# Patient Record
Sex: Male | Born: 1945
Health system: Southern US, Community
[De-identification: ages and names within clinical notes are randomized; demographics above are authoritative.]

## PROBLEM LIST (undated history)

## (undated) DIAGNOSIS — R251 Tremor, unspecified: Secondary | ICD-10-CM

## (undated) DIAGNOSIS — F0280 Dementia in other diseases classified elsewhere without behavioral disturbance: Secondary | ICD-10-CM

## (undated) DIAGNOSIS — E291 Testicular hypofunction: Secondary | ICD-10-CM

## (undated) DIAGNOSIS — R5383 Other fatigue: Secondary | ICD-10-CM

## (undated) DIAGNOSIS — I4891 Unspecified atrial fibrillation: Secondary | ICD-10-CM

## (undated) DIAGNOSIS — F0390 Unspecified dementia without behavioral disturbance: Secondary | ICD-10-CM

## (undated) DIAGNOSIS — F32A Depression, unspecified: Secondary | ICD-10-CM

## (undated) DIAGNOSIS — R2689 Other abnormalities of gait and mobility: Secondary | ICD-10-CM

## (undated) DIAGNOSIS — F0150 Vascular dementia without behavioral disturbance: Secondary | ICD-10-CM

## (undated) DIAGNOSIS — R42 Dizziness and giddiness: Secondary | ICD-10-CM

## (undated) DIAGNOSIS — G2581 Restless legs syndrome: Secondary | ICD-10-CM

## (undated) DIAGNOSIS — K644 Residual hemorrhoidal skin tags: Secondary | ICD-10-CM

## (undated) DIAGNOSIS — N529 Male erectile dysfunction, unspecified: Secondary | ICD-10-CM

## (undated) DIAGNOSIS — F419 Anxiety disorder, unspecified: Secondary | ICD-10-CM

## (undated) DIAGNOSIS — D1730 Benign lipomatous neoplasm of skin and subcutaneous tissue of unspecified sites: Secondary | ICD-10-CM

## (undated) DIAGNOSIS — G8929 Other chronic pain: Secondary | ICD-10-CM

## (undated) DIAGNOSIS — I10 Essential (primary) hypertension: Secondary | ICD-10-CM

## (undated) DIAGNOSIS — F329 Major depressive disorder, single episode, unspecified: Secondary | ICD-10-CM

## (undated) DIAGNOSIS — F319 Bipolar disorder, unspecified: Secondary | ICD-10-CM

## (undated) DIAGNOSIS — R413 Other amnesia: Secondary | ICD-10-CM

## (undated) DIAGNOSIS — I639 Cerebral infarction, unspecified: Secondary | ICD-10-CM

## (undated) DIAGNOSIS — M545 Low back pain, unspecified: Secondary | ICD-10-CM

## (undated) DIAGNOSIS — G4730 Sleep apnea, unspecified: Secondary | ICD-10-CM

## (undated) DIAGNOSIS — E785 Hyperlipidemia, unspecified: Secondary | ICD-10-CM

## (undated) DIAGNOSIS — I499 Cardiac arrhythmia, unspecified: Secondary | ICD-10-CM

## (undated) DIAGNOSIS — M1990 Unspecified osteoarthritis, unspecified site: Secondary | ICD-10-CM

## (undated) DIAGNOSIS — G309 Alzheimer's disease, unspecified: Secondary | ICD-10-CM

## (undated) HISTORY — DX: Unspecified atrial fibrillation: I48.91

## (undated) HISTORY — DX: Hyperlipidemia, unspecified: E78.5

## (undated) HISTORY — DX: Dementia in other diseases classified elsewhere, unspecified severity, without behavioral disturbance, psychotic disturbance, mood disturbance, and anxiety: F02.80

## (undated) HISTORY — DX: Other abnormalities of gait and mobility: R26.89

## (undated) HISTORY — DX: Alzheimer's disease, unspecified: G30.9

## (undated) HISTORY — DX: Benign lipomatous neoplasm of skin and subcutaneous tissue of unspecified sites: D17.30

## (undated) HISTORY — PX: TONSILLECTOMY AND ADENOIDECTOMY: SUR1326

## (undated) HISTORY — DX: Vascular dementia, unspecified severity, without behavioral disturbance, psychotic disturbance, mood disturbance, and anxiety: F02.80

## (undated) HISTORY — DX: Unspecified dementia, unspecified severity, without behavioral disturbance, psychotic disturbance, mood disturbance, and anxiety: F03.90

## (undated) HISTORY — DX: Tremor, unspecified: R25.1

## (undated) HISTORY — DX: Essential (primary) hypertension: I10

## (undated) HISTORY — DX: Male erectile dysfunction, unspecified: N52.9

## (undated) HISTORY — DX: Vascular dementia, unspecified severity, without behavioral disturbance, psychotic disturbance, mood disturbance, and anxiety: F01.50

## (undated) HISTORY — PX: COLONOSCOPY: SHX174

## (undated) HISTORY — DX: Cerebral infarction, unspecified: I63.9

---

## 1989-03-03 DIAGNOSIS — I639 Cerebral infarction, unspecified: Secondary | ICD-10-CM

## 1989-03-03 HISTORY — DX: Cerebral infarction, unspecified: I63.9

## 2007-09-24 ENCOUNTER — Emergency Department (HOSPITAL_COMMUNITY): Admission: EM | Admit: 2007-09-24 | Discharge: 2007-09-24 | Payer: Self-pay | Admitting: Emergency Medicine

## 2007-10-23 ENCOUNTER — Encounter: Admission: RE | Admit: 2007-10-23 | Discharge: 2007-10-23 | Payer: Self-pay | Admitting: Neurology

## 2010-11-29 LAB — DIFFERENTIAL
Eosinophils Absolute: 0.2
Eosinophils Relative: 3
Lymphs Abs: 1.9
Monocytes Relative: 7

## 2010-11-29 LAB — COMPREHENSIVE METABOLIC PANEL
ALT: <8
AST: 19
Albumin: 3.6
Calcium: 9.2
GFR calc Af Amer: >60
Sodium: 142
Total Protein: 6.1

## 2010-11-29 LAB — URINALYSIS, ROUTINE W REFLEX MICROSCOPIC
Ketones, ur: 15 — AB
Nitrite: NEGATIVE
Specific Gravity, Urine: 1.039 — ABNORMAL HIGH
Urobilinogen, UA: 1
pH: 6

## 2010-11-29 LAB — CBC
MCHC: 34.5
RBC: 3.87 — ABNORMAL LOW
RDW: 13.3

## 2010-11-29 LAB — PROTIME-INR: INR: 3 — ABNORMAL HIGH

## 2010-11-29 LAB — AMMONIA: Ammonia: 36 — ABNORMAL HIGH

## 2011-05-27 ENCOUNTER — Encounter: Payer: Self-pay | Admitting: *Deleted

## 2011-08-27 ENCOUNTER — Emergency Department (HOSPITAL_COMMUNITY): Payer: Medicare Other

## 2011-08-27 ENCOUNTER — Encounter (HOSPITAL_COMMUNITY): Payer: Self-pay | Admitting: *Deleted

## 2011-08-27 ENCOUNTER — Observation Stay (HOSPITAL_COMMUNITY)
Admission: EM | Admit: 2011-08-27 | Discharge: 2011-08-28 | Disposition: A | Discharge status: Home / Self Care | Payer: Medicare Other | Attending: Emergency Medicine | Admitting: Emergency Medicine

## 2011-08-27 DIAGNOSIS — Z8673 Personal history of transient ischemic attack (TIA), and cerebral infarction without residual deficits: Secondary | ICD-10-CM | POA: Insufficient documentation

## 2011-08-27 DIAGNOSIS — G459 Transient cerebral ischemic attack, unspecified: Principal | ICD-10-CM | POA: Insufficient documentation

## 2011-08-27 DIAGNOSIS — I10 Essential (primary) hypertension: Secondary | ICD-10-CM | POA: Insufficient documentation

## 2011-08-27 DIAGNOSIS — E785 Hyperlipidemia, unspecified: Secondary | ICD-10-CM | POA: Insufficient documentation

## 2011-08-27 LAB — BASIC METABOLIC PANEL
GFR calc Af Amer: 77 mL/min — ABNORMAL LOW (ref >90)
GFR calc non Af Amer: 66 mL/min — ABNORMAL LOW (ref >90)
Glucose, Bld: 113 mg/dL — ABNORMAL HIGH (ref 70–99)
Potassium: 4.9 mEq/L (ref 3.5–5.1)
Sodium: 139 mEq/L (ref 135–145)

## 2011-08-27 LAB — CBC
Hemoglobin: 14 g/dL (ref 13.0–17.0)
MCHC: 33.5 g/dL (ref 30.0–36.0)
Platelets: 161 10*3/uL (ref 150–400)

## 2011-08-27 LAB — URINALYSIS, ROUTINE W REFLEX MICROSCOPIC
Hgb urine dipstick: NEGATIVE
Leukocytes, UA: NEGATIVE
Nitrite: NEGATIVE
Protein, ur: NEGATIVE mg/dL
Specific Gravity, Urine: 1.022 (ref 1.005–1.030)
Urobilinogen, UA: 0.2 mg/dL (ref 0.0–1.0)

## 2011-08-27 LAB — PROTIME-INR
INR: 1.47 (ref 0.00–1.49)
Prothrombin Time: 18.1 seconds — ABNORMAL HIGH (ref 11.6–15.2)

## 2011-08-27 LAB — LIPID PANEL
HDL: 57 mg/dL (ref >39)
LDL Cholesterol: 64 mg/dL (ref 0–99)

## 2011-08-27 MED ORDER — CARBIDOPA-LEVODOPA 25-250 MG PO TABS
1.0000 | ORAL_TABLET | Freq: Two times a day (BID) | ORAL | Status: DC
  Administered 2011-08-28 (×2): 1 via ORAL
  Filled 2011-08-27 (×3): qty 1

## 2011-08-27 MED ORDER — DIVALPROEX SODIUM 250 MG PO DR TAB
250.0000 mg | DELAYED_RELEASE_TABLET | Freq: Two times a day (BID) | ORAL | Status: DC
  Administered 2011-08-28: 250 mg via ORAL
  Filled 2011-08-27: qty 1

## 2011-08-27 MED ORDER — LISINOPRIL 10 MG PO TABS
10.0000 mg | ORAL_TABLET | Freq: Every day | ORAL | Status: DC
  Administered 2011-08-28: 10 mg via ORAL
  Filled 2011-08-27 (×2): qty 1

## 2011-08-27 MED ORDER — WARFARIN SODIUM 5 MG PO TABS
5.0000 mg | ORAL_TABLET | Freq: Once | ORAL | Status: AC
  Administered 2011-08-28: 5 mg via ORAL
  Filled 2011-08-27: qty 1

## 2011-08-27 MED ORDER — LAMOTRIGINE 100 MG PO TABS
100.0000 mg | ORAL_TABLET | Freq: Every day | ORAL | Status: DC
  Administered 2011-08-28: 100 mg via ORAL
  Filled 2011-08-27 (×2): qty 1

## 2011-08-27 MED ORDER — SIMVASTATIN 10 MG PO TABS
10.0000 mg | ORAL_TABLET | Freq: Every evening | ORAL | Status: DC
  Administered 2011-08-28: 10 mg via ORAL
  Filled 2011-08-27 (×2): qty 1

## 2011-08-27 NOTE — ED Notes (Signed)
Pt has multiple complaints, reports going to pcp this am due to double vision this am along with unsteady gait. Remains mildly dizzy at this time, but reports it was decreased and double vision has resolved. Reports this has been occuring intermittently for at least the past month. Also having diarrhea occ diarrhea, had n/v today. No acute distress noted at triage.

## 2011-08-27 NOTE — ED Provider Notes (Addendum)
History     CSN: [id number]  Arrival date & time 08/27/11  1639   First MD Initiated Contact with Patient 08/27/11 2038      Chief Complaint  Patient presents with  . Dizziness  . Visual Field Change    (Consider location/radiation/quality/duration/timing/severity/associated sxs/prior treatment) Patient is a 66 y.o. male presenting with weakness. The history is provided by the patient and the spouse.  Weakness The primary symptoms include dizziness and visual change. Primary symptoms do not include headaches, paresthesias, focal weakness, loss of sensation or speech change. Primary symptoms comment: double vision in the left eye and ataxia that started around 11 am today and resolved prior to arrival in the ED The symptoms began 2 to 6 hours ago. The episode lasted 5 hours. The symptoms are resolved. The neurological symptoms are focal. Context: was driving in the car when double vision started.  Dizziness does not occur with weakness.  The visual change began today. The visual change has been resolved since its onset.The visual change is located in the entire field of vision of the left eye. The visual change includes double vision.  Additional symptoms include loss of balance. Additional symptoms do not include weakness, leg pain or taste disturbance. Medical issues also include cerebral vascular accident. Medical issues do not include seizures. Workup history includes MRI.    Past Medical History  Diagnosis Date  . HLD (hyperlipidemia)   . HTN (hypertension)   . Stroke 1991  . Atrial fibrillation     Past Surgical History  Procedure Date  . Tonsillectomy and adenoidectomy     Family History  Problem Relation Age of Onset  . Suicidality    . Cancer    . Alcohol abuse      History  Substance Use Topics  . Smoking status: Never Smoker   . Smokeless tobacco: Not on file  . Alcohol Use: Yes     occasional      Review of Systems  Eyes: Positive for double vision.    Neurological: Positive for dizziness and loss of balance. Negative for speech change, focal weakness, weakness, headaches and paresthesias.  All other systems reviewed and are negative.    Allergies  Review of patient's allergies indicates no known allergies.  Home Medications   Current Outpatient Rx  Name Route Sig Dispense Refill  . CARBIDOPA-LEVODOPA 25-250 MG PO TABS Oral Take 1 tablet by mouth 2 (two) times daily.    Marland Kitchen DIVALPROEX SODIUM 250 MG PO TBEC Oral Take 250 mg by mouth 2 (two) times daily.    . OMEGA-3 FATTY ACIDS 1000 MG PO CAPS Oral Take 1 g by mouth daily.     Marland Kitchen FLAX SEED OIL PO Oral Take 1 tablet by mouth daily.     Marland Kitchen FOLIC ACID 1 MG PO TABS Oral Take 1 mg by mouth daily.    Marland Kitchen LAMOTRIGINE 100 MG PO TABS Oral Take 100 mg by mouth daily.    Marland Kitchen LISINOPRIL 10 MG PO TABS Oral Take 10 mg by mouth daily.    Marland Kitchen SIMVASTATIN 20 MG PO TABS Oral Take 10 mg by mouth every evening.    . WARFARIN SODIUM 5 MG PO TABS Oral Take 5-7.5 mg by mouth daily. Take 7.5 mg on Tuesday, Thursday, and Saturday. All other days take 5 mg      BP 194/106  Pulse 74  Temp 97.6 F (36.4 C) (Oral)  Resp 15  SpO2 99%  Physical Exam  Nursing note and  vitals reviewed. Constitutional: He is oriented to person, place, and time. He appears well-developed and well-nourished. No distress.  HENT:  Head: Normocephalic and atraumatic.  Mouth/Throat: Oropharynx is clear and moist.  Eyes: Conjunctivae and EOM are normal. Pupils are equal, round, and reactive to light.  Neck: Normal range of motion. Neck supple. Carotid bruit is not present.  Cardiovascular: Normal rate and intact distal pulses.  An irregularly irregular rhythm present.  No murmur heard. Pulmonary/Chest: Effort normal and breath sounds normal. No respiratory distress. He has no wheezes. He has no rales.  Abdominal: Soft. He exhibits no distension. There is no tenderness. There is no rebound and no guarding.  Musculoskeletal: Normal range of  motion. He exhibits no edema and no tenderness.  Neurological: He is alert and oriented to person, place, and time. He has normal strength. No cranial nerve deficit or sensory deficit. He displays a negative Romberg sign. Coordination normal.       Normal finger to nose.  Normal heel to shin on the right and slow but accurate on the left  Skin: Skin is warm and dry. No rash noted. No erythema.  Psychiatric: He has a normal mood and affect. His behavior is normal.    ED Course  Procedures (including critical care time)  Labs Reviewed  BASIC METABOLIC PANEL - Abnormal; Notable for the following:    Glucose, Bld 113 (*)     GFR calc non Af Amer 66 (*)     GFR calc Af Amer 77 (*)     All other components within normal limits  PROTIME-INR - Abnormal; Notable for the following:    Prothrombin Time 18.1 (*)     All other components within normal limits  CARDIAC PANEL(CRET KIN+CKTOT+MB+TROPI) - Abnormal; Notable for the following:    CK, MB 4.6 (*)     Relative Index 3.2 (*)     All other components within normal limits  CBC  URINALYSIS, ROUTINE W REFLEX MICROSCOPIC   Mr Brain Wo Contrast  08/27/2011  *RADIOLOGY REPORT*  Clinical Data: 66 year old male with double vision, inability to walk, dizziness.  History of previous stroke affecting the left side.  MRI HEAD WITHOUT CONTRAST  Technique:  Multiplanar, multiecho pulse sequences of the brain and surrounding structures were obtained according to standard protocol without intravenous contrast.  Comparison: None.  Findings: No restricted diffusion to suggest acute infarction.  No midline shift, mass effect, evidence of mass lesion, ventriculomegaly, extra-axial collection or acute intracranial hemorrhage.  Cervicomedullary junction and pituitary are within normal limits.  Major intracranial vascular flow voids are preserved; dominant right vertebral artery suspected.  There is evidence of focal cystic encephalomalacia and wallerian degeneration at  the right mid brain.  No definite cortical encephalomalacia is identified in the right hemisphere.  There is scattered mild to moderate for age cerebral white matter T2 and FLAIR hyperintensity.  Deep gray matter nuclei are within normal limits for age.  Cerebellum is within normal limits.  Visualized internal auditory structures are grossly normal. Negative for age visualized cervical spine.  Normal bone marrow signal. Asymmetrically pneumatized right anterior clinoid process is suspected.  Visualized orbit soft tissues are within normal limits.  Small left maxillary sinus mucous retention cyst.  Negative scalp soft tissues.  IMPRESSION: 1. No acute intracranial abnormality. 2.  Evidence of remote right midbrain infarct.  Original Report Authenticated By: Harley Hallmark, M.D.    Date: 08/27/2011  Rate: 61  Rhythm: atrial fibrillation  QRS Axis: normal  Intervals: normal  ST/T Wave abnormalities: normal  Conduction Disutrbances:none  Narrative Interpretation:   Old EKG Reviewed: none available    1. TIA (transient ischemic attack)       MDM   Pt with sx concerning for cerebellar stroke with left sided double vision and ataxia today which has almost completely resolved.  Pt has hx of stroke 20 years ago which was from afib at that time.  Pt denies any focal weakness and on exam today has some difficulties with heel to shin on the left side.  Normal visual fields and no double vision now.  Denies any vertiginous sx and was able to ambulate to his room but was slow.  Coumadin is subtherapuetic.  EKG with afib.  MR of brain pending.  All labs wnl.    Pt's brain MR is wnl.  No carotid bruits on exam, however discussed with pt f/u with PCP to get outpt carotid and vertebral dopplers done.        Gwyneth Sprout, MD 08/27/11 1610  Gwyneth Sprout, MD 08/27/11 2333

## 2011-08-27 NOTE — ED Notes (Signed)
Report given to [place], Charity fundraiser and pt moved to CDU

## 2011-08-28 DIAGNOSIS — G459 Transient cerebral ischemic attack, unspecified: Secondary | ICD-10-CM

## 2011-08-28 DIAGNOSIS — I517 Cardiomegaly: Secondary | ICD-10-CM

## 2011-08-28 LAB — HEMOGLOBIN A1C: Mean Plasma Glucose: 128 mg/dL — ABNORMAL HIGH (ref <117)

## 2011-08-28 NOTE — Progress Notes (Signed)
VASCULAR LAB PRELIMINARY  PRELIMINARY  PRELIMINARY  PRELIMINARY  Carotid duplex  completed.    Preliminary report:  Bilateral:  No evidence of hemodynamically significant internal carotid artery stenosis.   Vertebral artery flow is antegrade.      Braylinn Gulden, RVT 08/28/2011, 11:04 AM

## 2011-08-28 NOTE — ED Notes (Signed)
Pt transported to vascular lab. 

## 2011-08-28 NOTE — ED Provider Notes (Signed)
Medical screening examination/treatment/procedure(s) were performed by non-physician practitioner and as supervising physician I was immediately available for consultation/collaboration.   Nat Christen, MD 08/28/11 [phone]

## 2011-08-28 NOTE — ED Notes (Signed)
Pt provided blood pressure and stroke booklet.

## 2011-08-28 NOTE — Progress Notes (Signed)
Observation review is complete. 

## 2011-08-28 NOTE — Discharge Instructions (Signed)
Follow up with your Primary Care Physician.  Your coumadin dose may need to be adjusted.  Your INR was low today.  The MRI done of your brain did not show any evidence of a new stoke.   Transient Ischemic Attack A transient ischemic attack (TIA) is a "warning stroke" that causes stroke-like symptoms. Unlike a stroke, a TIA does not cause permanent damage to the brain. The symptoms of a TIA can happen very fast and do not last long. It is important to know the symptoms of a TIA and what to do. This can help prevent a major stroke or death. CAUSES   A TIA is caused by a temporary blockage in an artery in the brain or neck (carotid artery). The blockage does not allow the brain to get the blood supply it needs and can cause different symptoms. The blockage can be caused by either:   A blood clot.   Fatty buildup (plaque) in a neck or brain artery.  SYMPTOMS  TIA symptoms are the same as a stroke but are temporary. Symptoms can include sudden:  Numbness or weakness on one side of the body. Especially to the:   Face.   Arm.   Leg.   Trouble speaking, thinking, or confusion.   Change in vision, such as trouble seeing in one or both eyes.   Dizziness, loss of balance, or difficulty walking.   Severe headache.  ANY OF THESE SYMPTOMS MAY REPRESENT A SERIOUS PROBLEM THAT IS AN EMERGENCY. Do not wait to see if the symptoms will go away. Get medical help at once. Call your local emergency services (911 in [place] IMMEDIATELY. DO NOT drive yourself to the hospital. RISK FACTORS Risk factors can increase the risk of developing a TIA. These can include.   High blood pressure (hypertension).   High cholesterol (hyperlipidemia).   Heart disease (atherosclerosis).   Smoking.   Diabetes.   Abnormal heart rhythm (atrial fibrillation).   Family history of a stroke or heart attack.   Use of oral contraceptives (especially when combined with smoking).  DIAGNOSIS   A TIA can be diagnosed  based on your:   Symptoms.   History.   Risk factors.   Tests that can help diagnose the symptoms of a TIA include:   CT or MRI scan. These tests can provide detailed images of the brain.   Carotid ultrasound. This test looks to see if there are blockages in the carotid arteries of your neck.   Arteriography. A thin, small flexible tube (catheter) is inserted through a small cut (incision) in your groin. The catheter is threaded to your carotid or vertebral artery. A dye is then injected into the catheter. The dye highlights the arteries in your brain and allows your caregiver to look for narrowing or blockages that can cause a TIA.  TREATMENT  Based on the cause of a TIA, treatment options can vary. Treatment is important to help prevent a stroke. Treatment options can include:  Medication. Such as:   Clot-busting medicine.   Anti-platelet medicine.   Blood pressure medicine.   Blood thinner medicine.   Surgery:   Carotid endarterectomy. The carotid arteries are the arteries that supply the head and neck with oxygenated blood. This surgery can help remove fatty deposits (plaque) in the carotid arteries.   Angioplasty and stenting. This surgery uses a balloon to dilate a blocked artery in the brain. A stent is a small, metal mesh tube that can help keep an artery open  HOME CARE INSTRUCTIONS   It is important to take all medicine as told by your caregiver. If the medicine has side effects that affect you negatively, tell your caregiver right away. Do not stop taking medicine unless told by your caregiver. Some medicines may need to be changed to better treat your condition.   Do not smoke. Talk to your caregiver on how to quit smoking.   Eat a diet high in fruits, vegetables and lean meat. Avoid a high fat, high salt diet. A dietician can you help you make healthy food choices.   Maintain a healthy weight. Develop an exercise plan approved by your caregiver.  SEEK IMMEDIATE  MEDICAL CARE IF:   You develop weakness or numbness on one side of your body.   You have problems thinking, speaking, or feel confused.   You have vision changes.   You feel dizzy, have trouble walking, or lose your balance.   You develop a severe headache.  MAKE SURE YOU:   Understand these instructions.   Will watch your condition.   Will get help right away if you are not doing well or get worse.  Document Released: 11/27/2004 Document Revised: 02/06/2011 Document Reviewed: 04/12/2009 [facility] Patient Information 2012 [place], [place].Transient Ischemic Attack A transient ischemic attack (TIA) is a "warning stroke" that causes stroke-like symptoms. Unlike a stroke, a TIA does not cause permanent damage to the brain. The symptoms of a TIA can happen very fast and do not last long. It is important to know the symptoms of a TIA and what to do. This can help prevent a major stroke or death. CAUSES   A TIA is caused by a temporary blockage in an artery in the brain or neck (carotid artery). The blockage does not allow the brain to get the blood supply it needs and can cause different symptoms. The blockage can be caused by either:   A blood clot.   Fatty buildup (plaque) in a neck or brain artery.  SYMPTOMS  TIA symptoms are the same as a stroke but are temporary. Symptoms can include sudden:  Numbness or weakness on one side of the body. Especially to the:   Face.   Arm.   Leg.   Trouble speaking, thinking, or confusion.   Change in vision, such as trouble seeing in one or both eyes.   Dizziness, loss of balance, or difficulty walking.   Severe headache.  ANY OF THESE SYMPTOMS MAY REPRESENT A SERIOUS PROBLEM THAT IS AN EMERGENCY. Do not wait to see if the symptoms will go away. Get medical help at once. Call your local emergency services (911 in [place] IMMEDIATELY. DO NOT drive yourself to the hospital. RISK FACTORS Risk factors can increase the risk of developing a TIA.  These can include.   High blood pressure (hypertension).   High cholesterol (hyperlipidemia).   Heart disease (atherosclerosis).   Smoking.   Diabetes.   Abnormal heart rhythm (atrial fibrillation).   Family history of a stroke or heart attack.   Use of oral contraceptives (especially when combined with smoking).  DIAGNOSIS   A TIA can be diagnosed based on your:   Symptoms.   History.   Risk factors.   Tests that can help diagnose the symptoms of a TIA include:   CT or MRI scan. These tests can provide detailed images of the brain.   Carotid ultrasound. This test looks to see if there are blockages in the carotid arteries of your neck.   Arteriography.  A thin, small flexible tube (catheter) is inserted through a small cut (incision) in your groin. The catheter is threaded to your carotid or vertebral artery. A dye is then injected into the catheter. The dye highlights the arteries in your brain and allows your caregiver to look for narrowing or blockages that can cause a TIA.  TREATMENT  Based on the cause of a TIA, treatment options can vary. Treatment is important to help prevent a stroke. Treatment options can include:  Medication. Such as:   Clot-busting medicine.   Anti-platelet medicine.   Blood pressure medicine.   Blood thinner medicine.   Surgery:   Carotid endarterectomy. The carotid arteries are the arteries that supply the head and neck with oxygenated blood. This surgery can help remove fatty deposits (plaque) in the carotid arteries.   Angioplasty and stenting. This surgery uses a balloon to dilate a blocked artery in the brain. A stent is a small, metal mesh tube that can help keep an artery open  HOME CARE INSTRUCTIONS   It is important to take all medicine as told by your caregiver. If the medicine has side effects that affect you negatively, tell your caregiver right away. Do not stop taking medicine unless told by your caregiver. Some  medicines may need to be changed to better treat your condition.   Do not smoke. Talk to your caregiver on how to quit smoking.   Eat a diet high in fruits, vegetables and lean meat. Avoid a high fat, high salt diet. A dietician can you help you make healthy food choices.   Maintain a healthy weight. Develop an exercise plan approved by your caregiver.  SEEK IMMEDIATE MEDICAL CARE IF:   You develop weakness or numbness on one side of your body.   You have problems thinking, speaking, or feel confused.   You have vision changes.   You feel dizzy, have trouble walking, or lose your balance.   You develop a severe headache.  MAKE SURE YOU:   Understand these instructions.   Will watch your condition.   Will get help right away if you are not doing well or get worse.  Document Released: 11/27/2004 Document Revised: 02/06/2011 Document Reviewed: 04/12/2009 [facility] Patient Information 2012 [place], [place].

## 2011-08-28 NOTE — ED Provider Notes (Signed)
7:00 AM Assumed care of patient in the CDU.  Patient is currently on TIA protocol.  Patient presented to the ED yesterday with a chief complaint of dizziness and double vision.  Patient had a MRI yesterday, which did not show any acute abnormalities.  He is to get Carotid Dopplers and 2D Echocardiogram today.  Plan is for him to be discharged home if normal.    7:30 AM Reassessed patient.  Patient reports that he is not having any symptoms at this time.  Alert and orientated x 3, Heart RRR, Lungs CTAB, CN II-XII grossly intact, grip strength 5/5 bilaterally, lower extremity muscle strength 5/5 bilaterally, normal gait.    10:30 AM Reassessed patient.  He reports that he is not having any symptoms at this time.  2D Echocardiogram showed an EF of 55% and atrial fibrillation.  Patient has a history of a fib and is currently on Coumadin.  Carotid Doppler has been completed.  Awaiting results.  11:30 AM Carotid Doppler did not show any significant stenosis.  Patient continues to be asymptomatic.  No visual changes at this time.  Alert and orientated x 3, Heart RRR, Lungs CTAB, CN II-XII grossly intact, grip strength 5/5 bilaterally, lower extremity muscle strength 5/5 bilaterally, normal gait.  Discussed all of the results with the patient.  Plan is for patient to be discharged home.  Patient instructed to follow up with his Primary Care Physician.  Patient in agreement with the plan.  Return precautions have been discussed.    Pascal Lux [place], PA-C 08/28/11 1226

## 2011-08-28 NOTE — Progress Notes (Signed)
  Echocardiogram 2D Echocardiogram has been performed.  Virjean Boman 08/28/2011, 9:03 AM

## 2011-11-24 ENCOUNTER — Other Ambulatory Visit: Payer: Self-pay | Admitting: Neurology

## 2011-11-24 DIAGNOSIS — I679 Cerebrovascular disease, unspecified: Secondary | ICD-10-CM

## 2011-11-24 DIAGNOSIS — R413 Other amnesia: Secondary | ICD-10-CM

## 2011-11-24 DIAGNOSIS — H532 Diplopia: Secondary | ICD-10-CM

## 2012-07-01 ENCOUNTER — Other Ambulatory Visit (HOSPITAL_COMMUNITY): Payer: Self-pay | Admitting: Cardiovascular Disease

## 2012-07-01 DIAGNOSIS — I4891 Unspecified atrial fibrillation: Secondary | ICD-10-CM

## 2012-07-09 ENCOUNTER — Telehealth: Payer: Self-pay | Admitting: Diagnostic Neuroimaging

## 2012-07-09 ENCOUNTER — Other Ambulatory Visit: Payer: Self-pay | Admitting: Internal Medicine

## 2012-07-09 ENCOUNTER — Ambulatory Visit
Admission: RE | Admit: 2012-07-09 | Discharge: 2012-07-09 | Disposition: A | Discharge status: Home / Self Care | Payer: Medicare Other | Source: Ambulatory Visit | Attending: Internal Medicine | Admitting: Internal Medicine

## 2012-07-09 DIAGNOSIS — H532 Diplopia: Secondary | ICD-10-CM

## 2012-07-14 ENCOUNTER — Encounter (HOSPITAL_COMMUNITY): Payer: Medicare Other

## 2012-07-22 ENCOUNTER — Institutional Professional Consult (permissible substitution): Payer: Medicare Other | Admitting: Neurology

## 2012-08-19 ENCOUNTER — Institutional Professional Consult (permissible substitution): Payer: Medicare Other | Admitting: Neurology

## 2012-08-20 ENCOUNTER — Encounter (HOSPITAL_COMMUNITY): Payer: Medicare Other

## 2012-08-26 ENCOUNTER — Telehealth: Payer: Self-pay | Admitting: Neurology

## 2012-08-30 ENCOUNTER — Ambulatory Visit: Payer: Medicare Other | Admitting: Neurology

## 2012-08-31 ENCOUNTER — Ambulatory Visit (HOSPITAL_COMMUNITY)
Admission: RE | Admit: 2012-08-31 | Discharge: 2012-08-31 | Disposition: A | Discharge status: Home / Self Care | Payer: Medicare Other | Source: Ambulatory Visit | Attending: Cardiology | Admitting: Cardiology

## 2012-08-31 ENCOUNTER — Institutional Professional Consult (permissible substitution): Payer: Medicare Other | Admitting: Neurology

## 2012-08-31 DIAGNOSIS — I4891 Unspecified atrial fibrillation: Secondary | ICD-10-CM | POA: Insufficient documentation

## 2012-08-31 DIAGNOSIS — R42 Dizziness and giddiness: Secondary | ICD-10-CM | POA: Insufficient documentation

## 2012-08-31 DIAGNOSIS — I10 Essential (primary) hypertension: Secondary | ICD-10-CM | POA: Insufficient documentation

## 2012-08-31 DIAGNOSIS — R5381 Other malaise: Secondary | ICD-10-CM | POA: Insufficient documentation

## 2012-08-31 DIAGNOSIS — Z8673 Personal history of transient ischemic attack (TIA), and cerebral infarction without residual deficits: Secondary | ICD-10-CM | POA: Insufficient documentation

## 2012-08-31 MED ORDER — TECHNETIUM TC 99M SESTAMIBI GENERIC - CARDIOLITE
10.6000 | Freq: Once | INTRAVENOUS | Status: AC | PRN
  Administered 2012-08-31: 11 via INTRAVENOUS

## 2012-08-31 MED ORDER — TECHNETIUM TC 99M SESTAMIBI GENERIC - CARDIOLITE
30.7000 | Freq: Once | INTRAVENOUS | Status: AC | PRN
  Administered 2012-08-31: 30.7 via INTRAVENOUS

## 2012-08-31 NOTE — Procedures (Addendum)
[facility] [address] [place] [address] [phone]  Cardiology Nuclear Med Study  [name] [name] is a 67 y.o. male     MRN : [id number]     DOB: 1945/07/30  Procedure Date: 08/31/2012  Nuclear Med Background Indication for Stress Test:  Evaluation for Ischemia History:  A-FIB Cardiac Risk Factors: CVA, Hypertension and Lipids  Symptoms:  Dizziness, Fatigue and Light-Headedness   Nuclear Pre-Procedure Caffeine/Decaff Intake:  1:00am NPO After: 11AM   IV Site: R Antecubital  IV 0.9% NS with Angio Cath:  22g  Chest Size (in):  42"  IV Started by: Emmit Pomfret, RN  Height: 5\' 10"  (1.778 m)  Cup Size: n/a  BMI:  Body mass index is 28.12 kg/(m^2). Weight:  196 lb (88.905 kg)   Tech Comments:  N/A    Nuclear Med Study 1 or 2 day study: 1 day  Stress Test Type:  Stress  Order Authorizing Provider:  [name] BERRY,MD   Resting Radionuclide: Technetium 36m Sestamibi  Resting Radionuclide Dose: 10.6 mCi   Stress Radionuclide:  Technetium 45m Sestamibi  Stress Radionuclide Dose: 30.7 mCi           Stress Protocol Rest HR: 83 Stress HR: 173  Rest BP:126/83 Stress BP: 147/68  Exercise Time (min): 4:15 METS: 6.10   Predicted Max HR: 151 bpm % Max HR: 114.57 bpm Rate Pressure Product: 32440  Dose of Adenosine (mg):  n/a Dose of Lexiscan: n/a mg  Dose of Atropine (mg): n/a Dose of Dobutamine: n/a mcg/kg/min (at max HR)  Stress Test Technologist: Ernestene Mention, CCT Nuclear Technologist: Gonzella [name], CNMT   Rest Procedure:  Myocardial perfusion imaging was performed at rest 45 minutes following the intravenous administration of Technetium 33m Sestamibi. Stress Procedure:  The patient performed treadmill exercise using a Bruce  Protocol for 4 minutes and 15 seconds. The patient stopped due to fatigue and shortness of breath. Patient denied any chest pain.  There were no significant ST-T wave changes.  Technetium 51m  Sestamibi was injected at peak exercise and myocardial perfusion imaging was performed after a brief delay.  Transient Ischemic Dilatation (Normal <1.22):  1.05 Lung/Heart Ratio (Normal <0.45):  0.31 QGS EDV:  n/a ml QGS ESV:  n/a ml LV Ejection Fraction: Study not gated  Signed by Gonzella [name], CNMT  PHYSICIAN INTERPRETATION  Rest ECG: Atrial Fibrilliation and non-specific ST-T changes  Stress ECG: Insignificant upsloping ST segment depression.  QPS Raw Data Images:  Mild diaphragmatic attenuation.  Normal left ventricular size. Stress Images:  There is decreased uptake in the inferior wall.  There is decreased uptake in the apex.  There does appear to be a small sized, mild intensity reversible perfusion defect involving the anteroapical wall.   Rest Images:  There is decreased uptake in the inferior wall.   Subtraction (SDS):  There is a fixed inferior defect that is most consistent with diaphragmatic attenuation.  There are findings suggesting possible mild apical ischemia.  Impression Exercise Capacity:  Fair exercise capacity. BP Response:  Normal BP response, but brisk increas of Afib-VR up to 170 bpm.  Clinical Symptoms:  There is dyspnea. and Fatigue ECG Impression:  Insignificant upsloping ST segment depression. Comparison with Prior Nuclear Study: No images to compare  Overall Impression:  Low risk stress nuclear study with a suggestion of a small area of anteroapical ischemia..  LV Wall Motion:  Not evaluated due to Afib.   [name] Lex, MD  08/31/2012  4:57 PM

## 2013-03-17 ENCOUNTER — Observation Stay (HOSPITAL_COMMUNITY)
Admission: EM | Admit: 2013-03-17 | Discharge: 2013-03-21 | Disposition: A | Discharge status: Skilled Nursing Facility | Payer: Medicare Other | Attending: Internal Medicine | Admitting: Internal Medicine

## 2013-03-17 ENCOUNTER — Emergency Department (HOSPITAL_COMMUNITY): Payer: Medicare Other

## 2013-03-17 ENCOUNTER — Encounter (HOSPITAL_COMMUNITY): Payer: Self-pay | Admitting: Emergency Medicine

## 2013-03-17 ENCOUNTER — Inpatient Hospital Stay (HOSPITAL_COMMUNITY): Payer: Medicare Other

## 2013-03-17 DIAGNOSIS — R4789 Other speech disturbances: Secondary | ICD-10-CM | POA: Insufficient documentation

## 2013-03-17 DIAGNOSIS — I10 Essential (primary) hypertension: Secondary | ICD-10-CM | POA: Diagnosis present

## 2013-03-17 DIAGNOSIS — R471 Dysarthria and anarthria: Secondary | ICD-10-CM | POA: Insufficient documentation

## 2013-03-17 DIAGNOSIS — I4891 Unspecified atrial fibrillation: Secondary | ICD-10-CM

## 2013-03-17 DIAGNOSIS — G4733 Obstructive sleep apnea (adult) (pediatric): Secondary | ICD-10-CM | POA: Insufficient documentation

## 2013-03-17 DIAGNOSIS — G459 Transient cerebral ischemic attack, unspecified: Secondary | ICD-10-CM | POA: Diagnosis present

## 2013-03-17 DIAGNOSIS — R262 Difficulty in walking, not elsewhere classified: Secondary | ICD-10-CM | POA: Insufficient documentation

## 2013-03-17 DIAGNOSIS — I6789 Other cerebrovascular disease: Secondary | ICD-10-CM | POA: Insufficient documentation

## 2013-03-17 DIAGNOSIS — H538 Other visual disturbances: Secondary | ICD-10-CM | POA: Insufficient documentation

## 2013-03-17 DIAGNOSIS — J3489 Other specified disorders of nose and nasal sinuses: Secondary | ICD-10-CM | POA: Insufficient documentation

## 2013-03-17 DIAGNOSIS — M1610 Unilateral primary osteoarthritis, unspecified hip: Secondary | ICD-10-CM | POA: Insufficient documentation

## 2013-03-17 DIAGNOSIS — F319 Bipolar disorder, unspecified: Secondary | ICD-10-CM

## 2013-03-17 DIAGNOSIS — E785 Hyperlipidemia, unspecified: Secondary | ICD-10-CM | POA: Diagnosis present

## 2013-03-17 DIAGNOSIS — H532 Diplopia: Secondary | ICD-10-CM | POA: Insufficient documentation

## 2013-03-17 DIAGNOSIS — M169 Osteoarthritis of hip, unspecified: Secondary | ICD-10-CM | POA: Insufficient documentation

## 2013-03-17 DIAGNOSIS — Z8673 Personal history of transient ischemic attack (TIA), and cerebral infarction without residual deficits: Secondary | ICD-10-CM | POA: Insufficient documentation

## 2013-03-17 DIAGNOSIS — R269 Unspecified abnormalities of gait and mobility: Principal | ICD-10-CM | POA: Insufficient documentation

## 2013-03-17 DIAGNOSIS — Z9181 History of falling: Secondary | ICD-10-CM | POA: Insufficient documentation

## 2013-03-17 DIAGNOSIS — I6529 Occlusion and stenosis of unspecified carotid artery: Secondary | ICD-10-CM | POA: Insufficient documentation

## 2013-03-17 DIAGNOSIS — M25559 Pain in unspecified hip: Secondary | ICD-10-CM | POA: Insufficient documentation

## 2013-03-17 DIAGNOSIS — Z7901 Long term (current) use of anticoagulants: Secondary | ICD-10-CM | POA: Insufficient documentation

## 2013-03-17 DIAGNOSIS — R2681 Unsteadiness on feet: Secondary | ICD-10-CM | POA: Diagnosis present

## 2013-03-17 DIAGNOSIS — I482 Chronic atrial fibrillation, unspecified: Secondary | ICD-10-CM | POA: Diagnosis present

## 2013-03-17 LAB — COMPREHENSIVE METABOLIC PANEL
ALK PHOS: 103 U/L (ref 39–117)
ALT: <5 U/L (ref 0–53)
AST: 21 U/L (ref 0–37)
Albumin: 4.6 g/dL (ref 3.5–5.2)
BILIRUBIN TOTAL: 0.6 mg/dL (ref 0.3–1.2)
BUN: 8 mg/dL (ref 6–23)
CO2: 25 meq/L (ref 19–32)
CREATININE: 0.76 mg/dL (ref 0.50–1.35)
Calcium: 10.4 mg/dL (ref 8.4–10.5)
Chloride: 95 mEq/L — ABNORMAL LOW (ref 96–112)
GFR calc Af Amer: >90 mL/min (ref >90)
Glucose, Bld: 105 mg/dL — ABNORMAL HIGH (ref 70–99)
POTASSIUM: 4.2 meq/L (ref 3.7–5.3)
Sodium: 136 mEq/L — ABNORMAL LOW (ref 137–147)
Total Protein: 8 g/dL (ref 6.0–8.3)

## 2013-03-17 LAB — CBC
HCT: 44.4 % (ref 39.0–52.0)
HEMOGLOBIN: 15.3 g/dL (ref 13.0–17.0)
MCH: 32.3 pg (ref 26.0–34.0)
MCHC: 34.5 g/dL (ref 30.0–36.0)
MCV: 93.7 fL (ref 78.0–100.0)
Platelets: 193 10*3/uL (ref 150–400)
RBC: 4.74 MIL/uL (ref 4.22–5.81)
RDW: 12.4 % (ref 11.5–15.5)
WBC: 4.6 10*3/uL (ref 4.0–10.5)

## 2013-03-17 LAB — POCT I-STAT TROPONIN I: Troponin i, poc: 0 ng/mL (ref 0.00–0.08)

## 2013-03-17 LAB — POCT I-STAT, CHEM 8
BUN: 7 mg/dL (ref 6–23)
Calcium, Ion: 1.31 mmol/L — ABNORMAL HIGH (ref 1.13–1.30)
Chloride: 96 mEq/L (ref 96–112)
Creatinine, Ser: 0.9 mg/dL (ref 0.50–1.35)
Glucose, Bld: 110 mg/dL — ABNORMAL HIGH (ref 70–99)
HCT: 49 % (ref 39.0–52.0)
HEMOGLOBIN: 16.7 g/dL (ref 13.0–17.0)
Potassium: 4 mEq/L (ref 3.7–5.3)
SODIUM: 136 meq/L — AB (ref 137–147)
TCO2: 28 mmol/L (ref 0–100)

## 2013-03-17 LAB — DIFFERENTIAL
Basophils Absolute: 0 10*3/uL (ref 0.0–0.1)
Basophils Relative: 0 % (ref 0–1)
Eosinophils Absolute: 0.2 10*3/uL (ref 0.0–0.7)
Eosinophils Relative: 3 % (ref 0–5)
LYMPHS PCT: 33 % (ref 12–46)
Lymphs Abs: 1.5 10*3/uL (ref 0.7–4.0)
MONO ABS: 0.3 10*3/uL (ref 0.1–1.0)
MONOS PCT: 7 % (ref 3–12)
Neutro Abs: 2.6 10*3/uL (ref 1.7–7.7)
Neutrophils Relative %: 56 % (ref 43–77)

## 2013-03-17 LAB — PROTIME-INR
INR: 2.4 — ABNORMAL HIGH (ref 0.00–1.49)
Prothrombin Time: 25.4 seconds — ABNORMAL HIGH (ref 11.6–15.2)

## 2013-03-17 LAB — URINALYSIS, ROUTINE W REFLEX MICROSCOPIC
Bilirubin Urine: NEGATIVE
Glucose, UA: NEGATIVE mg/dL
HGB URINE DIPSTICK: NEGATIVE
Ketones, ur: 15 mg/dL — AB
Leukocytes, UA: NEGATIVE
NITRITE: NEGATIVE
PROTEIN: NEGATIVE mg/dL
Specific Gravity, Urine: 1.012 (ref 1.005–1.030)
UROBILINOGEN UA: 0.2 mg/dL (ref 0.0–1.0)
pH: 8 (ref 5.0–8.0)

## 2013-03-17 LAB — RAPID URINE DRUG SCREEN, HOSP PERFORMED
Amphetamines: NOT DETECTED
BARBITURATES: NOT DETECTED
Benzodiazepines: NOT DETECTED
Cocaine: NOT DETECTED
OPIATES: NOT DETECTED
TETRAHYDROCANNABINOL: NOT DETECTED

## 2013-03-17 LAB — TROPONIN I: Troponin I: <0.3 ng/mL (ref <0.30)

## 2013-03-17 LAB — ETHANOL: Alcohol, Ethyl (B): <11 mg/dL (ref 0–11)

## 2013-03-17 LAB — APTT: APTT: 38 s — AB (ref 24–37)

## 2013-03-17 MED ORDER — WARFARIN SODIUM 7.5 MG PO TABS
7.5000 mg | ORAL_TABLET | ORAL | Status: AC
  Administered 2013-03-18: 7.5 mg via ORAL
  Filled 2013-03-17 (×2): qty 1

## 2013-03-17 MED ORDER — LAMOTRIGINE 150 MG PO TABS
150.0000 mg | ORAL_TABLET | Freq: Two times a day (BID) | ORAL | Status: DC
  Administered 2013-03-18 – 2013-03-21 (×8): 150 mg via ORAL
  Filled 2013-03-17 (×10): qty 1

## 2013-03-17 MED ORDER — ONDANSETRON HCL 4 MG/2ML IJ SOLN: 4.0000 mg | Freq: Four times a day (QID) | INTRAMUSCULAR | Status: DC | PRN

## 2013-03-17 MED ORDER — ACETAMINOPHEN 650 MG RE SUPP
650.0000 mg | Freq: Four times a day (QID) | RECTAL | Status: DC | PRN
Start: 2013-03-17 — End: 2013-03-21

## 2013-03-17 MED ORDER — WARFARIN SODIUM 7.5 MG PO TABS: 7.5000 mg | ORAL_TABLET | ORAL | Status: DC

## 2013-03-17 MED ORDER — OMEGA-3 FATTY ACIDS 1000 MG PO CAPS: 1.0000 g | ORAL_CAPSULE | Freq: Every day | ORAL | Status: DC

## 2013-03-17 MED ORDER — ATORVASTATIN CALCIUM 40 MG PO TABS
40.0000 mg | ORAL_TABLET | Freq: Every day | ORAL | Status: DC
  Administered 2013-03-18 – 2013-03-21 (×4): 40 mg via ORAL
  Filled 2013-03-17 (×5): qty 1

## 2013-03-17 MED ORDER — SODIUM CHLORIDE 0.9 % IJ SOLN
3.0000 mL | Freq: Two times a day (BID) | INTRAMUSCULAR | Status: DC
  Administered 2013-03-18 – 2013-03-20 (×6): 3 mL via INTRAVENOUS

## 2013-03-17 MED ORDER — WARFARIN SODIUM 5 MG PO TABS
5.0000 mg | ORAL_TABLET | ORAL | Status: DC
  Filled 2013-03-17: qty 1

## 2013-03-17 MED ORDER — DIVALPROEX SODIUM 250 MG PO DR TAB
250.0000 mg | DELAYED_RELEASE_TABLET | Freq: Two times a day (BID) | ORAL | Status: DC
  Administered 2013-03-18 – 2013-03-21 (×8): 250 mg via ORAL
  Filled 2013-03-17 (×10): qty 1

## 2013-03-17 MED ORDER — WARFARIN - PHARMACIST DOSING INPATIENT: Freq: Every day | Status: DC

## 2013-03-17 MED ORDER — LISINOPRIL 10 MG PO TABS
10.0000 mg | ORAL_TABLET | Freq: Every day | ORAL | Status: DC
  Administered 2013-03-18 – 2013-03-21 (×4): 10 mg via ORAL
  Filled 2013-03-17 (×5): qty 1

## 2013-03-17 MED ORDER — ACETAMINOPHEN 325 MG PO TABS: 650.0000 mg | ORAL_TABLET | Freq: Four times a day (QID) | ORAL | Status: DC | PRN

## 2013-03-17 MED ORDER — ONDANSETRON HCL 4 MG PO TABS: 4.0000 mg | ORAL_TABLET | Freq: Four times a day (QID) | ORAL | Status: DC | PRN

## 2013-03-17 MED ORDER — OMEGA-3-ACID ETHYL ESTERS 1 G PO CAPS
1.0000 g | ORAL_CAPSULE | Freq: Every day | ORAL | Status: DC
  Administered 2013-03-18 – 2013-03-21 (×4): 1 g via ORAL
  Filled 2013-03-17 (×4): qty 1

## 2013-03-17 MED ORDER — CARBIDOPA-LEVODOPA 25-250 MG PO TABS
1.0000 | ORAL_TABLET | Freq: Two times a day (BID) | ORAL | Status: DC
  Administered 2013-03-18 – 2013-03-21 (×8): 1 via ORAL
  Filled 2013-03-17 (×10): qty 1

## 2013-03-17 NOTE — H&P (Addendum)
[facility] History and Physical  [name] Williamsen PPI:[id number] DOB: [birth year] DOA: 03/17/2013   PCP: Horatio Pel, MD   Chief Complaint: Slurred speech, difficulty walking  HPI:  68 year old male PMH of stroke, atrial fibrillation, HTN, and HLD who presents to the ED with slurred speech and visual disturbance. The patient went to bed around 3 AM on the day of admission. He woke up between 12 PM and 1 PM and noted some difficulty walking. He also had some blurry vision with diplopia. Patient states that he has had problems with blurred vision diplopia intermittently since January 2013. He states that this occurs anywhere from once per week to a few times per month. He has had multiple workups for stroke and was most recently admitted to [facility] in Summer 2014 during which stroke workup was neg.  He sees Dr. Jannifer Franklin for neurologic care. The patient felt like he had loss of balance this afternoon when he woke up. In addition, his wife noted some slurred speech which seems to be improved currently. However, he denies any overt dizziness, headache, syncope. In addition, the patient had this loss of balance 2 weeks ago and fell onto his left buttocks. The patient endorses compliance with his medications, but has forgotten to refill his lisinopril for over one month.  the patient denies any fevers, chills, chest discomfort, shortness breath, nausea, vomiting, diarrhea, abdominal pain, rashes.  In the ED,  BMP and CBC were unremarkable. Hepatic panel was unremarkable. INR was 2.40. CT brain was negative. Left hip x-ray was negative for fracture or dislocation. Incidentally, the patient had a nuclear medicine stress test on 08/31/2012 which was interpreted as low risk. MRI of the brain is pending.  Assessment/Plan: Dysarthria and gait disturbance -concerned about TIA/stroke -CT brain is negative -MRI brain--ordered in ED -Hemoglobin A1c, lipid panel, TSH -Echocardiogram -Carotid  duplex -Neurology has been consulted  -PT/OT -UA with reflex to urine culture -Depakote level Atrial fibrillation -Rate controlled -Continue warfarin  bipolar disorder  -Continue Depakote and Lamictal  Hypertension  -Continue lisinopril  Hyperlipidemia  -Continue Lipitor        Past Medical History  Diagnosis Date  . HLD (hyperlipidemia)   . HTN (hypertension)   . Stroke 1991  . Atrial fibrillation    Past Surgical History  Procedure Laterality Date  . Tonsillectomy and adenoidectomy     Social History:  reports that he has never smoked. He does not have any smokeless tobacco history on file. He reports that he drinks alcohol. His drug history is not on file.   Family History  Problem Relation Age of Onset  . Suicidality    . Cancer    . Alcohol abuse       No Known Allergies    Prior to Admission medications   Medication Sig Start Date End Date Taking? Authorizing Provider  atorvastatin (LIPITOR) 40 MG tablet Take 40 mg by mouth daily.   Yes Historical Provider, MD  carbidopa-levodopa (SINEMET IR) 25-250 MG per tablet Take 1 tablet by mouth 2 (two) times daily.   Yes Historical Provider, MD  divalproex (DEPAKOTE) 250 MG DR tablet Take 250 mg by mouth 2 (two) times daily.   Yes Historical Provider, MD  fish oil-omega-3 fatty acids 1000 MG capsule Take 1 g by mouth daily.    Yes Historical Provider, MD  folic acid (FOLVITE) 1 MG tablet Take 1 mg by mouth daily.   Yes Historical Provider, MD  lamoTRIgine (LAMICTAL) 150 MG tablet Take 150 mg  by mouth 2 (two) times daily.   Yes Historical Provider, MD  warfarin (COUMADIN) 5 MG tablet Take 5-7.5 mg by mouth daily. Take 7.5 mg on Tuesday, Thursday, and Saturday. All other days take 5 mg   Yes Historical Provider, MD  lisinopril (PRINIVIL,ZESTRIL) 10 MG tablet Take 10 mg by mouth daily.    Historical Provider, MD    Review of Systems:  Constitutional:  No weight loss, night sweats, Fevers, chills, fatigue.   Head&Eyes: No headache.  No vision loss.  No eye pain or scotoma ENT:  No Difficulty swallowing,Tooth/dental problems,Sore throat,  No ear ache, post nasal drip,  Cardio-vascular:  No chest pain, Orthopnea, PND, swelling in lower extremities,  dizziness, palpitations  GI:  No  abdominal pain, nausea, vomiting, diarrhea, loss of appetite, hematochezia, melena, heartburn, indigestion, Resp:  No shortness of breath with exertion or at rest. No cough. No coughing up of blood .No wheezing.No chest wall deformity  Skin:  no rash or lesions.  GU:  nochange in color of urine, no urgency or frequency. No flank pain.  Musculoskeletal:  No joint pain or swelling. No decreased range of motion. No back pain.  Psych:  No change in mood or affect.  Neurologic: No headache, no dysesthesia, no focal weakness, no vision loss. No syncope  Physical Exam: Filed Vitals:   03/17/13 1827 03/17/13 1843 03/17/13 1845 03/17/13 1900  BP:  182/95 183/112 176/104  Pulse:  80    Temp:  95.5 F (35.3 C)    TempSrc:  Oral    Resp:  25 21 14   SpO2: 100% 99%     General:  A&O x 3, NAD, nontoxic, pleasant/cooperative Head/Eye: No conjunctival hemorrhage, no icterus, [place]/AT, No nystagmus ENT:  No icterus,  No thrush, good dentition, no pharyngeal exudate Neck:  No masses, no lymphadenpathy, no bruits CV:  IRRR, no rub, no gallop, no S3 Lung:  CTAB, good air movement, no wheeze, no rhonchi Abdomen: soft/NT, +BS, nondistended, no peritoneal signs Ext: No cyanosis, No rashes, No petechiae, No lymphangitis, No edema Neuro: CNII-XII intact, strength 4/5 in bilateral upper and lower extremities, no dysmetria  Labs on Admission:  Basic Metabolic Panel:  Recent Labs Lab 03/17/13 1900 03/17/13 1933  NA 136* 136*  K 4.2 4.0  CL 95* 96  CO2 25  --   GLUCOSE 105* 110*  BUN 8 7  CREATININE 0.76 0.90  CALCIUM 10.4  --    Liver Function Tests:  Recent Labs Lab 03/17/13 1900  AST 21  ALT <5  ALKPHOS  103  BILITOT 0.6  PROT 8.0  ALBUMIN 4.6   No results found for this basename: LIPASE, AMYLASE,  in the last 168 hours No results found for this basename: AMMONIA,  in the last 168 hours CBC:  Recent Labs Lab 03/17/13 1900 03/17/13 1933  WBC 4.6  --   NEUTROABS 2.6  --   HGB 15.3 16.7  HCT 44.4 49.0  MCV 93.7  --   PLT 193  --    Cardiac Enzymes:  Recent Labs Lab 03/17/13 1900  TROPONINI <0.30   BNP: No components found with this basename: POCBNP,  CBG: No results found for this basename: GLUCAP,  in the last 168 hours  Radiological Exams on Admission: Dg Hip Complete Left  03/17/2013   CLINICAL DATA:  Fall.  Left hip pain.  EXAM: LEFT HIP - COMPLETE 2+ VIEW  COMPARISON:  None.  FINDINGS: There is no evidence of hip fracture or  dislocation. Mild left hip osteoarthritis noted.  No pelvic fracture identified. Severe right hip osteoarthritis demonstrated.  IMPRESSION: No acute findings.  Mild left and severe right hip osteoarthritis.   Electronically Signed   By: Earle Gell M.D.   On: 03/17/2013 20:36   Ct Head Wo Contrast  03/17/2013   CLINICAL DATA:  Slurred speech.  Difficulty ambulating.  EXAM: CT HEAD WITHOUT CONTRAST  TECHNIQUE: Contiguous axial images were obtained from the base of the skull through the vertex without intravenous contrast.  COMPARISON:  07/09/2012  FINDINGS: Sinuses/Soft tissues: Mucous retention cyst or polyp in the left maxillary sinus. Clear mastoid air cells.  Intracranial: Mild cerebral atrophy. No mass lesion, hemorrhage, hydrocephalus, acute infarct, intra-axial, or extra-axial fluid collection.  IMPRESSION: 1.  No acute intracranial abnormality. 2. Sinus disease.   Electronically Signed   By: Abigail Miyamoto M.D.   On: 03/17/2013 19:35    EKG: Independently reviewed. Atrial fibrillation, nonspecific ST-T wave change    Time spent:70 minutes Code Status:   Presumed full Family Communication:   Wife at bedside   Derrick Tiegs, DO  [facility] Pager [phone]  If 7PM-7AM, please contact night-coverage [email or web address] Password [facility] 03/17/2013, 9:25 PM

## 2013-03-17 NOTE — ED Provider Notes (Signed)
Medical screening examination/treatment/procedure(s) were conducted as a shared visit with non-physician practitioner(s) and myself.  I personally evaluated the patient during the encounter.  EKG Interpretation   None        Results for orders placed during the hospital encounter of 03/17/13  PROTIME-INR      Result Value Range   Prothrombin Time 25.4 (*) 11.6 - 15.2 seconds   INR 2.40 (*) 0.00 - 1.49  APTT      Result Value Range   aPTT 38 (*) 24 - 37 seconds  CBC      Result Value Range   WBC 4.6  4.0 - 10.5 K/uL   RBC 4.74  4.22 - 5.81 MIL/uL   Hemoglobin 15.3  13.0 - 17.0 g/dL   HCT 44.4  39.0 - 52.0 %   MCV 93.7  78.0 - 100.0 fL   MCH 32.3  26.0 - 34.0 pg   MCHC 34.5  30.0 - 36.0 g/dL   RDW 12.4  11.5 - 15.5 %   Platelets 193  150 - 400 K/uL  DIFFERENTIAL      Result Value Range   Neutrophils Relative % 56  43 - 77 %   Neutro Abs 2.6  1.7 - 7.7 K/uL   Lymphocytes Relative 33  12 - 46 %   Lymphs Abs 1.5  0.7 - 4.0 K/uL   Monocytes Relative 7  3 - 12 %   Monocytes Absolute 0.3  0.1 - 1.0 K/uL   Eosinophils Relative 3  0 - 5 %   Eosinophils Absolute 0.2  0.0 - 0.7 K/uL   Basophils Relative 0  0 - 1 %   Basophils Absolute 0.0  0.0 - 0.1 K/uL  TROPONIN I      Result Value Range   Troponin I <0.30  <0.30 ng/mL  POCT I-STAT, CHEM 8      Result Value Range   Sodium 136 (*) 137 - 147 mEq/L   Potassium 4.0  3.7 - 5.3 mEq/L   Chloride 96  96 - 112 mEq/L   BUN 7  6 - 23 mg/dL   Creatinine, Ser 0.90  0.50 - 1.35 mg/dL   Glucose, Bld 110 (*) 70 - 99 mg/dL   Calcium, Ion 1.31 (*) 1.13 - 1.30 mmol/L   TCO2 28  0 - 100 mmol/L   Hemoglobin 16.7  13.0 - 17.0 g/dL   HCT 49.0  39.0 - 52.0 %  POCT I-STAT TROPONIN I      Result Value Range   Troponin i, poc 0.00  0.00 - 0.08 ng/mL   Comment 3            Ct Head Wo Contrast  03/17/2013   CLINICAL DATA:  Slurred speech.  Difficulty ambulating.  EXAM: CT HEAD WITHOUT CONTRAST  TECHNIQUE: Contiguous axial images were obtained  from the base of the skull through the vertex without intravenous contrast.  COMPARISON:  07/09/2012  FINDINGS: Sinuses/Soft tissues: Mucous retention cyst or polyp in the left maxillary sinus. Clear mastoid air cells.  Intracranial: Mild cerebral atrophy. No mass lesion, hemorrhage, hydrocephalus, acute infarct, intra-axial, or extra-axial fluid collection.  IMPRESSION: 1.  No acute intracranial abnormality. 2. Sinus disease.   Electronically Signed   By: Abigail Miyamoto M.D.   On: 03/17/2013 19:35    Patient with strokelike symptoms still somewhat symptomatic. Head CT no acute intracranial abnormalities. Patient will require admission and MRI. Discussed with the neurologist they recommend admission a well and will  contact the hospitalist service for admission. Patient had EMS called to her residence related to difficulty ambulating since 12 PM today which was right after he woke up from a nap. Patient was last seen normal at 3 AM. Patient speaks with slow purposeful manner smile is symmetrical patient is alert and oriented x3. Patient is not a candidate for TPA code stroke was not called because he was out of the window.  Mervin Kung, MD 03/17/13 2010

## 2013-03-17 NOTE — ED Notes (Signed)
Per EMS called to residence related to difficulty ambulating since 12pm today which was first waking hour. EMS states increase in slurred speech in route. Pt remains alertx4  Pt MAE randomly. Pt speaks with slow purposeful manner. Smile symmetrical.

## 2013-03-17 NOTE — Progress Notes (Signed)
ANTICOAGULATION CONSULT NOTE - Initial Consult  Pharmacy Consult for Coumadin Indication: atrial fibrillation  No Known Allergies   Vital Signs: Temp: 97.8 F (36.6 C) (01/15 2000) Temp src: Oral (01/15 2000) BP: 178/98 mmHg (01/15 2200) Pulse Rate: 73 (01/15 2200)  Labs:  Recent Labs  03/17/13 1900 03/17/13 1933  HGB 15.3 16.7  HCT 44.4 49.0  PLT 193  --   APTT 38*  --   LABPROT 25.4*  --   INR 2.40*  --   CREATININE 0.76 0.90  TROPONINI <0.30  --     Medical History: Past Medical History  Diagnosis Date  . HLD (hyperlipidemia)   . HTN (hypertension)   . Stroke 1991  . Atrial fibrillation     Assessment: 68yo male c/o difficulty ambulating since noon, EMS states increase in slurred speech en route to ED, concern for TIA/CVA, to continue Coumadin for Afib; admitted with therapeutic INR.  Goal of Therapy:  INR 2-3   Plan:  Will continue home Coumadin dose of 7.5mg  TTS and 5mg  MWFS and monitor INR.  Wynona Neat, PharmD, BCPS  03/17/2013,11:05 PM

## 2013-03-17 NOTE — ED Notes (Signed)
Pt to MRI to floor from MRI

## 2013-03-17 NOTE — ED Notes (Signed)
Dr Tat aware of BP. No new orders noted. Pt ready for transport. Alertx4

## 2013-03-17 NOTE — ED Provider Notes (Signed)
CSN: [id number]     Arrival date & time 03/17/13  1825 History   First MD Initiated Contact with Patient 03/17/13 1837     Chief Complaint  Patient presents with  . Transient Ischemic Attack    HPI  [name] [name] is a 68 y.o. male with a PMH of stroke, atrial fibrillation, HTN, and HLD who presents to the ED for evaluation of TIA.  History was provided by the patient.  Patient states the he woke up around noon or 1:00 PM and states he was having difficulty walking.  Patient states his last known well time was 3:00 AM when he went to bed.  He also reports double vision, which he still has currently.  He also has peripheral vision loss on the left but he is unsure if this is new or from his prior stroke.  He denies any eye pain or photophobia.  He denies any headache, dizziness, lightheadedness, weakness or loss of sensation.  Patient also had slurred speech per EMS.  Patient is currently asymptomatic.  He denies any chest pain or SOB.  He has a hx of TIA and stroke.  Patient otherwise has been well with no recent fevers, cough, abdominal pain, emesis, vomiting, change in appetite/activity, or other concerns.  Patient is currently on warfarin and denies any missed doses.  Patient also states he feel a few weeks ago and has a bruise to the left buttocks and left posterior thigh.  He complains of pain to the left hip.     Past Medical History  Diagnosis Date  . HLD (hyperlipidemia)   . HTN (hypertension)   . Stroke 1991  . Atrial fibrillation    Past Surgical History  Procedure Laterality Date  . Tonsillectomy and adenoidectomy     Family History  Problem Relation Age of Onset  . Suicidality    . Cancer    . Alcohol abuse     History  Substance Use Topics  . Smoking status: Never Smoker   . Smokeless tobacco: Not on file  . Alcohol Use: Yes     Comment: occasional    Review of Systems  Constitutional: Negative for fever, chills, diaphoresis, activity change, appetite change and  fatigue.  HENT: Negative for congestion, rhinorrhea and sore throat.   Eyes: Positive for visual disturbance. Negative for photophobia and pain.  Respiratory: Negative for cough and shortness of breath.   Cardiovascular: Negative for chest pain and leg swelling.  Gastrointestinal: Negative for nausea, vomiting, abdominal pain, diarrhea and constipation.  Genitourinary: Negative for dysuria.  Musculoskeletal: Positive for gait problem. Negative for back pain.  Skin: Negative for wound.  Neurological: Positive for speech difficulty. Negative for dizziness, syncope, weakness, light-headedness and headaches.  Psychiatric/Behavioral: Negative for confusion.    Allergies  Review of patient's allergies indicates no known allergies.  Home Medications   Current Outpatient Rx  Name  Route  Sig  Dispense  Refill  . carbidopa-levodopa (SINEMET IR) 25-250 MG per tablet   Oral   Take 1 tablet by mouth 2 (two) times daily.         . divalproex (DEPAKOTE) 250 MG DR tablet   Oral   Take 250 mg by mouth 2 (two) times daily.         . fish oil-omega-3 fatty acids 1000 MG capsule   Oral   Take 1 g by mouth daily.          . Flaxseed, Linseed, (FLAX SEED OIL PO)  Oral   Take 1 tablet by mouth daily.          . folic acid (FOLVITE) 1 MG tablet   Oral   Take 1 mg by mouth daily.         Marland Kitchen lamoTRIgine (LAMICTAL) 100 MG tablet   Oral   Take 100 mg by mouth daily.         Marland Kitchen lisinopril (PRINIVIL,ZESTRIL) 10 MG tablet   Oral   Take 10 mg by mouth daily.         . simvastatin (ZOCOR) 20 MG tablet   Oral   Take 10 mg by mouth every evening.         . warfarin (COUMADIN) 5 MG tablet   Oral   Take 5-7.5 mg by mouth daily. Take 7.5 mg on Tuesday, Thursday, and Saturday. All other days take 5 mg          BP 176/104  Pulse 80  Temp(Src) 95.5 F (35.3 C) (Oral)  Resp 14  SpO2 99%  Filed Vitals:   03/17/13 1827 03/17/13 1843 03/17/13 1845 03/17/13 1900  BP:  182/95  183/112 176/104  Pulse:  80    Temp:  95.5 F (35.3 C)    TempSrc:  Oral    Resp:  25 21 14   SpO2: 100% 99%      Physical Exam  Nursing note and vitals reviewed. Constitutional: He is oriented to person, place, and time. He appears well-developed and well-nourished. No distress.  HENT:  Head: Normocephalic and atraumatic.  Right Ear: External ear normal.  Left Ear: External ear normal.  Nose: Nose normal.  Mouth/Throat: Oropharynx is clear and moist. No oropharyngeal exudate.  Tympanic membranes gray and translucent bilaterally with no erythema, edema, or hemotympanum.    Eyes: Conjunctivae and EOM are normal. Right eye exhibits no discharge. Left eye exhibits no discharge.  Pupils unequal.  Pupils reactive.  Decreased peripheral vision in the left fields  Neck: Normal range of motion. Neck supple.  Cardiovascular: Exam reveals no gallop and no friction rub.   Murmur heard. Irregular rate.  Grade 2 holosystolic murmur.  Dorsalis pedis pulses present and equal bilaterally  Pulmonary/Chest: Effort normal and breath sounds normal. No respiratory distress. He has no wheezes. He has no rales. He exhibits no tenderness.  Abdominal: Soft. He exhibits no distension and no mass. There is no tenderness. There is no rebound and no guarding.  Musculoskeletal: Normal range of motion. He exhibits no edema and no tenderness.  Strength 5/5 in the upper and lower extremities bilaterally.  No pedal edema or calf tenderness bilaterally.     Neurological: He is alert and oriented to person, place, and time.  GCS 15.  No facial droop.  No pronator drift.  Finger to nose intact.     Skin: Skin is warm and dry. He is not diaphoretic.     Large area of ecchymosis to the left buttocks and lateral thigh.  Area non-tender to palpation with no open wounds/lacerations    ED Course  Procedures (including critical care time) Labs Review Labs Reviewed - No data to display Imaging Review No results  found.  EKG Interpretation    Date/Time:    Ventricular Rate:    PR Interval:    QRS Duration:   QT Interval:    QTC Calculation:   R Axis:     Text Interpretation:              Date: 03/18/2013  Rate: 83  Rhythm: atrial fibrillation  QRS Axis: normal  Intervals: QT prolonged (borderline)  ST/T Wave abnormalities: normal  Conduction Disutrbances:none  Narrative Interpretation:   Old EKG Reviewed: unchanged   Results for orders placed during the hospital encounter of 03/17/13  ETHANOL      Result Value Range   Alcohol, Ethyl (B) <11  0 - 11 mg/dL  PROTIME-INR      Result Value Range   Prothrombin Time 25.4 (*) 11.6 - 15.2 seconds   INR 2.40 (*) 0.00 - 1.49  APTT      Result Value Range   aPTT 38 (*) 24 - 37 seconds  CBC      Result Value Range   WBC 4.6  4.0 - 10.5 K/uL   RBC 4.74  4.22 - 5.81 MIL/uL   Hemoglobin 15.3  13.0 - 17.0 g/dL   HCT 46.2  70.3 - 50.0 %   MCV 93.7  78.0 - 100.0 fL   MCH 32.3  26.0 - 34.0 pg   MCHC 34.5  30.0 - 36.0 g/dL   RDW 93.8  18.2 - 99.3 %   Platelets 193  150 - 400 K/uL  DIFFERENTIAL      Result Value Range   Neutrophils Relative % 56  43 - 77 %   Neutro Abs 2.6  1.7 - 7.7 K/uL   Lymphocytes Relative 33  12 - 46 %   Lymphs Abs 1.5  0.7 - 4.0 K/uL   Monocytes Relative 7  3 - 12 %   Monocytes Absolute 0.3  0.1 - 1.0 K/uL   Eosinophils Relative 3  0 - 5 %   Eosinophils Absolute 0.2  0.0 - 0.7 K/uL   Basophils Relative 0  0 - 1 %   Basophils Absolute 0.0  0.0 - 0.1 K/uL  COMPREHENSIVE METABOLIC PANEL      Result Value Range   Sodium 136 (*) 137 - 147 mEq/L   Potassium 4.2  3.7 - 5.3 mEq/L   Chloride 95 (*) 96 - 112 mEq/L   CO2 25  19 - 32 mEq/L   Glucose, Bld 105 (*) 70 - 99 mg/dL   BUN 8  6 - 23 mg/dL   Creatinine, Ser 7.16  0.50 - 1.35 mg/dL   Calcium 96.7  8.4 - 89.3 mg/dL   Total Protein 8.0  6.0 - 8.3 g/dL   Albumin 4.6  3.5 - 5.2 g/dL   AST 21  0 - 37 U/L   ALT <5  0 - 53 U/L   Alkaline Phosphatase 103  39  - 117 U/L   Total Bilirubin 0.6  0.3 - 1.2 mg/dL   GFR calc non Af Amer >90  >90 mL/min   GFR calc Af Amer >90  >90 mL/min  TROPONIN I      Result Value Range   Troponin I <0.30  <0.30 ng/mL  URINE RAPID DRUG SCREEN (HOSP PERFORMED)      Result Value Range   Opiates NONE DETECTED  NONE DETECTED   Cocaine NONE DETECTED  NONE DETECTED   Benzodiazepines NONE DETECTED  NONE DETECTED   Amphetamines NONE DETECTED  NONE DETECTED   Tetrahydrocannabinol NONE DETECTED  NONE DETECTED   Barbiturates NONE DETECTED  NONE DETECTED  URINALYSIS, ROUTINE W REFLEX MICROSCOPIC      Result Value Range   Color, Urine YELLOW  YELLOW   APPearance CLEAR  CLEAR   Specific Gravity, Urine 1.012  1.005 - 1.030  pH 8.0  5.0 - 8.0   Glucose, UA NEGATIVE  NEGATIVE mg/dL   Hgb urine dipstick NEGATIVE  NEGATIVE   Bilirubin Urine NEGATIVE  NEGATIVE   Ketones, ur 15 (*) NEGATIVE mg/dL   Protein, ur NEGATIVE  NEGATIVE mg/dL   Urobilinogen, UA 0.2  0.0 - 1.0 mg/dL   Nitrite NEGATIVE  NEGATIVE   Leukocytes, UA NEGATIVE  NEGATIVE       CT Head Wo Contrast (Final result)  Result time: 03/17/13 19:35:29    Final result by Rad Results In Interface (03/17/13 19:35:29)    Narrative:   CLINICAL DATA: Slurred speech. Difficulty ambulating.  EXAM: CT HEAD WITHOUT CONTRAST  TECHNIQUE: Contiguous axial images were obtained from the base of the skull through the vertex without intravenous contrast.  COMPARISON: 07/09/2012  FINDINGS: Sinuses/Soft tissues: Mucous retention cyst or polyp in the left maxillary sinus. Clear mastoid air cells.  Intracranial: Mild cerebral atrophy. No mass lesion, hemorrhage, hydrocephalus, acute infarct, intra-axial, or extra-axial fluid collection.  IMPRESSION: 1. No acute intracranial abnormality. 2. Sinus disease.   Electronically Signed By: Abigail Miyamoto M.D. On: 03/17/2013 19:35         DG Hip Complete Left (Final result)  Result time: 03/17/13 95:18:84    Final  result by Rad Results In Interface (03/17/13 20:36:32)    Narrative:   CLINICAL DATA: Fall. Left hip pain.  EXAM: LEFT HIP - COMPLETE 2+ VIEW  COMPARISON: None.  FINDINGS: There is no evidence of hip fracture or dislocation. Mild left hip osteoarthritis noted.  No pelvic fracture identified. Severe right hip osteoarthritis demonstrated.  IMPRESSION: No acute findings.  Mild left and severe right hip osteoarthritis.   Electronically Signed By: Earle Gell M.D. On: 03/17/2013 20:36         MDM   [name] [name] is a 68 y.o. male with a PMH of stroke, atrial fibrillation, HTN, and HLD who presents to the ED for evaluation of TIA.   Consults  8:00 PM = Spoke with Dr. Aram Beecham who will consult.  Admit to hospitalist.  MRI without contrast will be ordered.     8:37 PM = Spoke with Dr. Shanon Brow Tat who will admit.     Patient admitted for for further evaluation of a TIA vs CVA.  Last known well time outside the tx window (3:00 AM).  Patient had a negative head CT and is going for MRI per neurology who was consulted.  Patient A & O x 3.  His neurological deficits include decreased peripheral vision, unequal pupils, and double vision.  He complained of ataxia and allegedly had slurred speech per EMS PTA.  Patient found to be in atrial fibrillation and his warfarin is therapeutic.  Patient complained of pain to his left hip after a fall a few weeks ago.  X-rays negative for fx or dislocation.  Etiology of hip pain likely due to a contusion vs OA. Patient neurovascularly intact.  Patient admitted for further evaluation and management.    Final impressions: 1. TIA (transient ischemic attack)   2. Atrial fibrillation   3. Gait instability   4. Unspecified essential hypertension      Mercy Moore PA-C   This patient was discussed with Dr. Huel Cote, PA-C 03/18/13 1401

## 2013-03-18 ENCOUNTER — Encounter (HOSPITAL_COMMUNITY): Payer: Self-pay | Admitting: *Deleted

## 2013-03-18 ENCOUNTER — Inpatient Hospital Stay (HOSPITAL_COMMUNITY): Payer: Medicare Other

## 2013-03-18 DIAGNOSIS — I517 Cardiomegaly: Secondary | ICD-10-CM

## 2013-03-18 DIAGNOSIS — I4891 Unspecified atrial fibrillation: Secondary | ICD-10-CM

## 2013-03-18 DIAGNOSIS — G459 Transient cerebral ischemic attack, unspecified: Secondary | ICD-10-CM | POA: Diagnosis present

## 2013-03-18 DIAGNOSIS — R269 Unspecified abnormalities of gait and mobility: Secondary | ICD-10-CM

## 2013-03-18 DIAGNOSIS — I10 Essential (primary) hypertension: Secondary | ICD-10-CM

## 2013-03-18 LAB — BASIC METABOLIC PANEL
BUN: 11 mg/dL (ref 6–23)
CALCIUM: 9.5 mg/dL (ref 8.4–10.5)
CO2: 25 mEq/L (ref 19–32)
Chloride: 98 mEq/L (ref 96–112)
Creatinine, Ser: 0.91 mg/dL (ref 0.50–1.35)
GFR calc Af Amer: >90 mL/min (ref >90)
GFR calc non Af Amer: 84 mL/min — ABNORMAL LOW (ref >90)
Glucose, Bld: 100 mg/dL — ABNORMAL HIGH (ref 70–99)
Potassium: 4.2 mEq/L (ref 3.7–5.3)
SODIUM: 136 meq/L — AB (ref 137–147)

## 2013-03-18 LAB — CBC
HCT: 37.8 % — ABNORMAL LOW (ref 39.0–52.0)
Hemoglobin: 13 g/dL (ref 13.0–17.0)
MCH: 31.5 pg (ref 26.0–34.0)
MCHC: 34.4 g/dL (ref 30.0–36.0)
MCV: 91.5 fL (ref 78.0–100.0)
PLATELETS: 186 10*3/uL (ref 150–400)
RBC: 4.13 MIL/uL — AB (ref 4.22–5.81)
RDW: 12.3 % (ref 11.5–15.5)
WBC: 6.4 10*3/uL (ref 4.0–10.5)

## 2013-03-18 LAB — LIPID PANEL
CHOL/HDL RATIO: 1.8 ratio
CHOLESTEROL: 93 mg/dL (ref 0–200)
HDL: 52 mg/dL (ref >39)
LDL CALC: 34 mg/dL (ref 0–99)
TRIGLYCERIDES: 34 mg/dL (ref <150)
VLDL: 7 mg/dL (ref 0–40)

## 2013-03-18 LAB — HEMOGLOBIN A1C
Hgb A1c MFr Bld: 6.1 % — ABNORMAL HIGH (ref <5.7)
Mean Plasma Glucose: 128 mg/dL — ABNORMAL HIGH (ref <117)

## 2013-03-18 LAB — PROTIME-INR
INR: 3.74 — AB (ref 0.00–1.49)
Prothrombin Time: 35.6 seconds — ABNORMAL HIGH (ref 11.6–15.2)

## 2013-03-18 LAB — VALPROIC ACID LEVEL: Valproic Acid Lvl: <10 ug/mL — ABNORMAL LOW (ref 50.0–100.0)

## 2013-03-18 LAB — TSH: TSH: 1.135 u[IU]/mL (ref 0.350–4.500)

## 2013-03-18 MED ORDER — PNEUMOCOCCAL VAC POLYVALENT 25 MCG/0.5ML IJ INJ
0.5000 mL | INJECTION | INTRAMUSCULAR | Status: DC
  Filled 2013-03-18: qty 0.5

## 2013-03-18 NOTE — Progress Notes (Signed)
Rehab Admissions Coordinator Note:  Patient was screened by Cleatrice Burke for appropriateness for an Inpatient Acute Rehab Consult.  At this time, we are recommending await medical workup completion to determine rehab venue needs. Needdiagnosis clarification for insurance purposes. Marland Kitchen [employer] Medicare complete  Cleatrice Burke 03/18/2013, 6:10 PM  I can be reached at [phone].

## 2013-03-18 NOTE — Progress Notes (Signed)
OT NOTE  Pt and daughter requesting to have social work or case management assistance at this time. Daughter reports needing assistance with care management. Pt requesting MD discuss with wife inability to travel on 04/01/13 to attend a cruise.   Daughter would like to be the person to assist patient with care and wants to have forms sign to allow her to HIPPA protected information.    Jeri Modena   OTR/L Pager: [phone] Office: [phone] .

## 2013-03-18 NOTE — Consult Note (Signed)
NEURO HOSPITALIST CONSULT NOTE    Reason for Consult: transient unsteadiness and dysarthria.  HPI:                                                                                                                                          [name] [name] is an 68 y.o. male, right handed, with a past medical history significant for HTN, hyperlipidemia, atrial fibrillation on coumadin, obstructive sleep apnea on CPAP, ischemic stroke without residual deficits, brought in due to acute onset of dysequilibrium and dysarthria. [name] [name] was last known well at 3 am yesterday. He lives at home with his wife and said that he woke up around noon or 1 pm and when he got up to go to the bathroom he was so off balance that couldn't stand without holding on something. He said that his legs were not really weak but his son indicated that he had trouble raising up his arms when taking on the phone. Ambulance was called and he was noted to have slurred speech. Denies associated HA, vertigo, focal weakness or numbness, confusion, difficulty swallowing, language or vision impairment. His son is at the bedside and tells me that the whole episode lasted for about 3 hours. CT brain and MRI brain revealed no acute abnormality. INR 2.40    Past Medical History  Diagnosis Date  . HLD (hyperlipidemia)   . HTN (hypertension)   . Stroke 1991  . Atrial fibrillation     Past Surgical History  Procedure Laterality Date  . Tonsillectomy and adenoidectomy      Family History  Problem Relation Age of Onset  . Suicidality    . Cancer    . Alcohol abuse      Social History:  reports that he has never smoked. He does not have any smokeless tobacco history on file. He reports that he drinks about 0.6 ounces of alcohol per week. His drug history is not on file.  No Known Allergies  MEDICATIONS:                                                                                                                      I have reviewed the patient's current medications.   ROS:  History obtained from the patient, son, and chart review.  General ROS: negative for - chills, fatigue, fever, night sweats, weight gain or weight loss Psychological ROS: negative for - behavioral disorder, hallucinations, mood swings or suicidal ideation. Complains of having short term memory difficulties, Ophthalmic ROS: negative for - blurry vision, eye pain or loss of vision. Intermittent double vision that is chronic. ENT ROS: negative for - epistaxis, nasal discharge, oral lesions, sore throat, tinnitus or vertigo Allergy and Immunology ROS: negative for - hives or itchy/watery eyes Hematological and Lymphatic ROS: negative for - bleeding problems, bruising or swollen lymph nodes Endocrine ROS: negative for - galactorrhea, hair pattern changes, polydipsia/polyuria or temperature intolerance Respiratory ROS: negative for - cough, hemoptysis, shortness of breath or wheezing Cardiovascular ROS: negative for - chest pain, dyspnea on exertion, edema or irregular heartbeat Gastrointestinal ROS: negative for - abdominal pain, diarrhea, hematemesis, nausea/vomiting or stool incontinence Genito-Urinary ROS: negative for - dysuria, hematuria, incontinence or urinary frequency/urgency Musculoskeletal ROS: negative for - joint swelling or muscular weakness Neurological ROS: as noted in HPI. Right hand tremor and gait difficulty Dermatological ROS: negative for rash and skin lesion changes   Physical exam: pleasant male in no apparent distress. Blood pressure 148/88, pulse 78, temperature 98.6 F (37 C), temperature source Oral, resp. rate 18, height _0  (1.803 m), weight 87.9 kg (193 lb 12.6 oz), SpO2 99.00%. Head: normocephalic. Neck: supple, no bruits, no JVD. Cardiac: no murmurs. Lungs:  clear. Abdomen: soft, no tender, no mass. Extremities: no edema.  Neurologic Examination:                                                                                                      Mental Status: Alert, oriented, thought content appropriate.  Speech fluent without evidence of aphasia.  Able to follow 3 step commands without difficulty. Cranial Nerves: II: Discs flat bilaterally; Visual fields grossly normal, pupils equal, round, reactive to light and accommodation III,IV, VI: ptosis not present, extra-ocular motions intact bilaterally V,VII: smile symmetric, facial light touch sensation normal bilaterally VIII: hearing normal bilaterally IX,X: gag reflex present XI: bilateral shoulder shrug XII: midline tongue extension without atrophy or fasciculations  Motor: Right : Upper extremity   5/5    Left:     Upper extremity   5/5  Lower extremity   5/5     Lower extremity   5/5 Tone and bulk:normal tone throughout; no atrophy noted Sensory: Pinprick and light touch intact throughout, bilaterally Deep Tendon Reflexes:  1 all over Plantars: Right: downgoing   Left: downgoing Cerebellar: normal finger-to-nose,  normal heel-to-shin test. Subtle bilateral hand postural tremor. Gait: No tested. CV: pulses palpable throughout    Lab Results  Component Value Date/Time   CHOL 134 08/27/2011 10:54 PM    Results for orders placed during the hospital encounter of 03/17/13 (from the past 48 hour(s))  ETHANOL     Status: None   Collection Time    03/17/13  7:00 PM      Result Value Range   Alcohol, Ethyl (B) <11  0 -  11 mg/dL   Comment:            LOWEST DETECTABLE LIMIT FOR     SERUM ALCOHOL IS 11 mg/dL     FOR MEDICAL PURPOSES ONLY  PROTIME-INR     Status: Abnormal   Collection Time    03/17/13  7:00 PM      Result Value Range   Prothrombin Time 25.4 (*) 11.6 - 15.2 seconds   INR 2.40 (*) 0.00 - 1.49  APTT     Status: Abnormal   Collection Time    03/17/13  7:00 PM       Result Value Range   aPTT 38 (*) 24 - 37 seconds   Comment:            IF BASELINE aPTT IS ELEVATED,     SUGGEST PATIENT RISK ASSESSMENT     BE USED TO DETERMINE APPROPRIATE     ANTICOAGULANT THERAPY.  CBC     Status: None   Collection Time    03/17/13  7:00 PM      Result Value Range   WBC 4.6  4.0 - 10.5 K/uL   RBC 4.74  4.22 - 5.81 MIL/uL   Hemoglobin 15.3  13.0 - 17.0 g/dL   HCT 44.4  39.0 - 52.0 %   MCV 93.7  78.0 - 100.0 fL   MCH 32.3  26.0 - 34.0 pg   MCHC 34.5  30.0 - 36.0 g/dL   RDW 12.4  11.5 - 15.5 %   Platelets 193  150 - 400 K/uL  DIFFERENTIAL     Status: None   Collection Time    03/17/13  7:00 PM      Result Value Range   Neutrophils Relative % 56  43 - 77 %   Neutro Abs 2.6  1.7 - 7.7 K/uL   Lymphocytes Relative 33  12 - 46 %   Lymphs Abs 1.5  0.7 - 4.0 K/uL   Monocytes Relative 7  3 - 12 %   Monocytes Absolute 0.3  0.1 - 1.0 K/uL   Eosinophils Relative 3  0 - 5 %   Eosinophils Absolute 0.2  0.0 - 0.7 K/uL   Basophils Relative 0  0 - 1 %   Basophils Absolute 0.0  0.0 - 0.1 K/uL  COMPREHENSIVE METABOLIC PANEL     Status: Abnormal   Collection Time    03/17/13  7:00 PM      Result Value Range   Sodium 136 (*) 137 - 147 mEq/L   Potassium 4.2  3.7 - 5.3 mEq/L   Chloride 95 (*) 96 - 112 mEq/L   CO2 25  19 - 32 mEq/L   Glucose, Bld 105 (*) 70 - 99 mg/dL   BUN 8  6 - 23 mg/dL   Creatinine, Ser 0.76  0.50 - 1.35 mg/dL   Calcium 10.4  8.4 - 10.5 mg/dL   Total Protein 8.0  6.0 - 8.3 g/dL   Albumin 4.6  3.5 - 5.2 g/dL   AST 21  0 - 37 U/L   ALT <5  0 - 53 U/L   Alkaline Phosphatase 103  39 - 117 U/L   Total Bilirubin 0.6  0.3 - 1.2 mg/dL   GFR calc non Af Amer >90  >90 mL/min   GFR calc Af Amer >90  >90 mL/min   Comment: (NOTE)     The eGFR has been calculated using the CKD EPI equation.  This calculation has not been validated in all clinical situations.     eGFR's persistently <90 mL/min signify possible Chronic Kidney     Disease.  TROPONIN I      Status: None   Collection Time    03/17/13  7:00 PM      Result Value Range   Troponin I <0.30  <0.30 ng/mL   Comment:            Due to the release kinetics of cTnI,     a negative result within the first hours     of the onset of symptoms does not rule out     myocardial infarction with certainty.     If myocardial infarction is still suspected,     repeat the test at appropriate intervals.  POCT I-STAT TROPONIN I     Status: None   Collection Time    03/17/13  7:31 PM      Result Value Range   Troponin i, poc 0.00  0.00 - 0.08 ng/mL   Comment 3            Comment: Due to the release kinetics of cTnI,     a negative result within the first hours     of the onset of symptoms does not rule out     myocardial infarction with certainty.     If myocardial infarction is still suspected,     repeat the test at appropriate intervals.  POCT I-STAT, CHEM 8     Status: Abnormal   Collection Time    03/17/13  7:33 PM      Result Value Range   Sodium 136 (*) 137 - 147 mEq/L   Potassium 4.0  3.7 - 5.3 mEq/L   Chloride 96  96 - 112 mEq/L   BUN 7  6 - 23 mg/dL   Creatinine, Ser 0.90  0.50 - 1.35 mg/dL   Glucose, Bld 110 (*) 70 - 99 mg/dL   Calcium, Ion 1.31 (*) 1.13 - 1.30 mmol/L   TCO2 28  0 - 100 mmol/L   Hemoglobin 16.7  13.0 - 17.0 g/dL   HCT 49.0  39.0 - 52.0 %  URINE RAPID DRUG SCREEN (HOSP PERFORMED)     Status: None   Collection Time    03/17/13  8:48 PM      Result Value Range   Opiates NONE DETECTED  NONE DETECTED   Cocaine NONE DETECTED  NONE DETECTED   Benzodiazepines NONE DETECTED  NONE DETECTED   Amphetamines NONE DETECTED  NONE DETECTED   Tetrahydrocannabinol NONE DETECTED  NONE DETECTED   Barbiturates NONE DETECTED  NONE DETECTED   Comment:            DRUG SCREEN FOR MEDICAL PURPOSES     ONLY.  IF CONFIRMATION IS NEEDED     FOR ANY PURPOSE, NOTIFY LAB     WITHIN 5 DAYS.                LOWEST DETECTABLE LIMITS     FOR URINE DRUG SCREEN     Drug Class        Cutoff (ng/mL)     Amphetamine      1000     Barbiturate      200     Benzodiazepine   284     Tricyclics       132     Opiates          300  Cocaine          300     THC              50  URINALYSIS, ROUTINE W REFLEX MICROSCOPIC     Status: Abnormal   Collection Time    03/17/13  8:48 PM      Result Value Range   Color, Urine YELLOW  YELLOW   APPearance CLEAR  CLEAR   Specific Gravity, Urine 1.012  1.005 - 1.030   pH 8.0  5.0 - 8.0   Glucose, UA NEGATIVE  NEGATIVE mg/dL   Hgb urine dipstick NEGATIVE  NEGATIVE   Bilirubin Urine NEGATIVE  NEGATIVE   Ketones, ur 15 (*) NEGATIVE mg/dL   Protein, ur NEGATIVE  NEGATIVE mg/dL   Urobilinogen, UA 0.2  0.0 - 1.0 mg/dL   Nitrite NEGATIVE  NEGATIVE   Leukocytes, UA NEGATIVE  NEGATIVE   Comment: MICROSCOPIC NOT DONE ON URINES WITH NEGATIVE PROTEIN, BLOOD, LEUKOCYTES, NITRITE, OR GLUCOSE <1000 mg/dL.    Dg Hip Complete Left  03/17/2013   CLINICAL DATA:  Fall.  Left hip pain.  EXAM: LEFT HIP - COMPLETE 2+ VIEW  COMPARISON:  None.  FINDINGS: There is no evidence of hip fracture or dislocation. Mild left hip osteoarthritis noted.  No pelvic fracture identified. Severe right hip osteoarthritis demonstrated.  IMPRESSION: No acute findings.  Mild left and severe right hip osteoarthritis.   Electronically Signed   By: Earle Gell M.D.   On: 03/17/2013 20:36   Ct Head Wo Contrast  03/17/2013   CLINICAL DATA:  Slurred speech.  Difficulty ambulating.  EXAM: CT HEAD WITHOUT CONTRAST  TECHNIQUE: Contiguous axial images were obtained from the base of the skull through the vertex without intravenous contrast.  COMPARISON:  07/09/2012  FINDINGS: Sinuses/Soft tissues: Mucous retention cyst or polyp in the left maxillary sinus. Clear mastoid air cells.  Intracranial: Mild cerebral atrophy. No mass lesion, hemorrhage, hydrocephalus, acute infarct, intra-axial, or extra-axial fluid collection.  IMPRESSION: 1.  No acute intracranial abnormality. 2. Sinus disease.    Electronically Signed   By: Abigail Miyamoto M.D.   On: 03/17/2013 19:35   Mr Brain Wo Contrast  03/18/2013   CLINICAL DATA:  Unsteady gait, ataxia, slurred speech  EXAM: MRI HEAD WITHOUT CONTRAST  TECHNIQUE: Multiplanar, multiecho pulse sequences of the brain and surrounding structures were obtained without intravenous contrast.  COMPARISON:  Prior CT from earlier the same day as well as prior MRI from 08/27/2011  FINDINGS: Mild diffuse prominence of the CSF containing spaces is compatible with generalized age-related atrophy. Mild scattered and confluent T2/FLAIR hyperintensity within the periventricular and deep white matter is present, nonspecific, but likely related to chronic microvascular ischemic disease.  No mass lesion or midline shift. No hydrocephalus. No extra-axial fluid collection.  No diffusion-weighted signal abnormality is identified to suggest acute intracranial infarct. Normal flow voids seen within the intracranial vasculature. Gray-white matter differentiation is maintained. No acute intracranial hemorrhage. Cystic encephalomalacia with wallerian degeneration seen within the right midbrain, compatible with remote right mid brain infarct, unchanged.  Craniocervical junction is within normal limits. Pituitary gland is normal. Globes and optic nerves demonstrate a normal appearance.  Calvarium demonstrates a normal appearance with normal signal intensity.  Probable retention cyst noted within the left maxillary sinus, incompletely visualized. Minimal opacity present within the right sphenoid sinus. Paranasal sinuses are otherwise clear. No mastoid effusion.  IMPRESSION: 1. No acute intracranial infarct or other abnormality. 2. Mild age-related atrophy with chronic microvascular  ischemic disease. 3. Remote right mid brain infarct, unchanged.   Electronically Signed   By: Jeannine Boga M.D.   On: 03/18/2013 00:10   Assessment/Plan: 68 y/o brought in due to acute onset transient unsteadiness  and slurred speech that lasted for approximately 3 hours. Unremarkable neuro-exam and cerebral neuro-imaging. Risk factors for stroke: age, HTN, hyperlipidemia, atrial fibrillation. INR 2.40 Probable TIA with ABCD2 score 5. Admit to medicine and complete stroke work up. Will follow up.    Dorian Pod, MD 03/18/2013, 12:30 AM

## 2013-03-18 NOTE — Evaluation (Signed)
Physical Therapy Evaluation Patient Details Name: [name] [name] MRN: [id number] DOB: [birth year] Today's Date: 03/18/2013 Time: 6440-3474 PT Time Calculation (min): 25 min  PT Assessment / Plan / Recommendation History of Present Illness  pt presents with gait difficulties.  Clinical Impression  Pt very unsteady and ataxic with mobility.  Pt with poor awareness of deficits and safety until he is attempting to ambulate.  Pt not oriented to time and with memory difficulties repeating same questions to PT and not recalling the answer.  Pt at first denies visual deficits, though clearly seems to be having trouble focusing.  Pt needed A to find clock on wall when it was in pt's upper R visual field.  At first pt denied double vision, but then states "I guess I might be seeing double".  OT made aware of visual difficulties.  Feel pt would be great candidate for CIR at D/C.  Will continue to follow.      PT Assessment  Patient needs continued PT services    Follow Up Recommendations  CIR    Does the patient have the potential to tolerate intense rehabilitation      Barriers to Discharge        Equipment Recommendations  Rolling walker with 5" wheels    Recommendations for Other Services OT consult;Speech consult   Frequency Min 4X/week    Precautions / Restrictions Precautions Precautions: Fall Restrictions Weight Bearing Restrictions: No   Pertinent Vitals/Pain Denied pain.        Mobility  Bed Mobility Overal bed mobility: Needs Assistance Bed Mobility: Supine to Sit;Sit to Supine Supine to sit: Supervision;HOB elevated Sit to supine: Supervision;HOB elevated General bed mobility comments: pt able to complete without A, but does utilize rail to come to sit.   Transfers Overall transfer level: Needs assistance Equipment used: None Transfers: Sit to/from Stand Sit to Stand: Min assist General transfer comment: pt mildly unsteady and a little ataxic.    Ambulation/Gait Ambulation/Gait assistance: Mod assist Ambulation Distance (Feet): 30 Feet Assistive device: None Gait Pattern/deviations: Step-through pattern;Decreased stride length;Ataxic Gait velocity: Moves quickly.  Needs cueing to slow down.   General Gait Details: pt very ataxic with Bil LEs and poor balance.  pt with no awareness of deficits until he was attempting to ambulate and still did not realize extent of A PT was providing.  pt also with visual deficits affecting mobility.   Modified Rankin (Stroke Patients Only) Pre-Morbid Rankin Score: No symptoms Modified Rankin: Moderately severe disability    Exercises     PT Diagnosis: Difficulty walking  PT Problem List: Decreased activity tolerance;Decreased balance;Decreased mobility;Decreased coordination;Decreased cognition;Decreased knowledge of use of DME;Decreased safety awareness;Decreased knowledge of precautions PT Treatment Interventions: DME instruction;Gait training;Stair training;Functional mobility training;Therapeutic activities;Therapeutic exercise;Balance training;Neuromuscular re-education;Cognitive remediation;Patient/family education     PT Goals(Current goals can be found in the care plan section) Acute Rehab PT Goals Patient Stated Goal: Home PT Goal Formulation: With patient Time For Goal Achievement: 04/01/13 Potential to Achieve Goals: Good  Visit Information  Last PT Received On: 03/18/13 Assistance Needed: +2 (safety with ambulation) History of Present Illness: pt presents with gait difficulties.       Prior Functioning  Home Living Family/patient expects to be discharged to:: Inpatient rehab Living Arrangements: Spouse/significant other;Children Prior Function Level of Independence: Independent Communication Communication: No difficulties    Cognition  Cognition Arousal/Alertness: Awake/alert Behavior During Therapy: WFL for tasks assessed/performed Overall Cognitive Status:  Impaired/Different from baseline Area of Impairment: Orientation;Attention;Memory;Following commands;Safety/judgement;Awareness;Problem solving Orientation Level:  Disoriented to;Time Current Attention Level: Selective Memory: Decreased short-term memory Following Commands: Follows multi-step commands with increased time Safety/Judgement: Decreased awareness of safety;Decreased awareness of deficits Awareness: Emergent;Anticipatory Problem Solving: Slow processing;Difficulty sequencing;Requires verbal cues;Requires tactile cues General Comments: pt with poor awareness of deficits and repeats questions frequently.      Extremity/Trunk Assessment Upper Extremity Assessment Upper Extremity Assessment: Defer to OT evaluation Lower Extremity Assessment Lower Extremity Assessment: RLE deficits/detail;LLE deficits/detail RLE Coordination: decreased fine motor;decreased gross motor LLE Coordination: decreased fine motor;decreased gross motor   Balance Balance Overall balance assessment: Needs assistance Standing balance support: No upper extremity supported Standing balance-Leahy Scale: Poor Standing balance comment: pt ataxic in standing and needs cueing to stand still.    End of Session PT - End of Session Equipment Utilized During Treatment: Gait belt Activity Tolerance: Patient tolerated treatment well Patient left: in bed;with call bell/phone within reach;with bed alarm set Nurse Communication: Mobility status  GP     [name] [name], [place] 03/18/2013, 10:48 AM

## 2013-03-18 NOTE — Progress Notes (Signed)
  Echocardiogram 2D Echocardiogram has been performed.  [name] 03/18/2013, 11:18 AM

## 2013-03-18 NOTE — Progress Notes (Signed)
Subjective: Patient reports that he is at baseline today.  Overnight had a spell of diplopia.  Reports he has been having these spells intermittently for the past 2 years or so but has not had one in a few months.    Objective: Current vital signs: BP 109/62  Pulse 79  Temp(Src) 98.1 F (36.7 C) (Oral)  Resp 20  Ht 5\' 11"  (1.803 m)  Wt 87.9 kg (193 lb 12.6 oz)  BMI 27.04 kg/m2  SpO2 100% Vital signs in last 24 hours: Temp:  [95.5 F (35.3 C)-98.6 F (37 C)] 98.1 F (36.7 C) (01/16 0530) Pulse Rate:  [72-81] 79 (01/16 0530) Resp:  [13-25] 20 (01/16 0530) BP: (109-183)/(62-112) 109/62 mmHg (01/16 0530) SpO2:  [91 %-100 %] 100 % (01/16 0530) Weight:  [87.9 kg (193 lb 12.6 oz)] 87.9 kg (193 lb 12.6 oz) (01/16 0001)  Intake/Output from previous day: 01/15 0701 - 01/16 0700 In: 240 [P.O.:240] Out: 350 [Urine:350] Intake/Output this shift:   Nutritional status: Cardiac  Neurologic Exam: Mental Status:  Alert, oriented, thought content appropriate. Speech fluent without evidence of aphasia. Able to follow 3 step commands without difficulty.  Cranial Nerves:  II: Discs flat bilaterally; Visual fields grossly normal, pupils equal, round, reactive to light and accommodation  III,IV, VI: ptosis not present, extra-ocular motions intact bilaterally  V,VII: smile symmetric, facial light touch sensation normal bilaterally  VIII: hearing normal bilaterally  IX,X: gag reflex present  XI: bilateral shoulder shrug  XII: midline tongue extension without atrophy or fasciculations  Motor:  5/5 throughout  Sensory: Pinprick and light touch intact throughout, bilaterally  Deep Tendon Reflexes:  1 throughout  Plantars:  Right: downgoing    Left: downgoing  Cerebellar:  normal finger-to-nose, normal heel-to-shin test. + postural tremor.   Lab Results: Basic Metabolic Panel:  Recent Labs Lab 03/17/13 1900 03/17/13 1933 03/18/13 0455  NA 136* 136* 136*  K 4.2 4.0 4.2  CL 95* 96 98   CO2 25  --  25  GLUCOSE 105* 110* 100*  BUN 8 7 11   CREATININE 0.76 0.90 0.91  CALCIUM 10.4  --  9.5    Liver Function Tests:  Recent Labs Lab 03/17/13 1900  AST 21  ALT <5  ALKPHOS 103  BILITOT 0.6  PROT 8.0  ALBUMIN 4.6   No results found for this basename: LIPASE, AMYLASE,  in the last 168 hours No results found for this basename: AMMONIA,  in the last 168 hours  CBC:  Recent Labs Lab 03/17/13 1900 03/17/13 1933 03/18/13 0455  WBC 4.6  --  6.4  NEUTROABS 2.6  --   --   HGB 15.3 16.7 13.0  HCT 44.4 49.0 37.8*  MCV 93.7  --  91.5  PLT 193  --  186    Cardiac Enzymes:  Recent Labs Lab 03/17/13 1900  TROPONINI <0.30    Lipid Panel:  Recent Labs Lab 03/18/13 0453  CHOL 93  TRIG 34  HDL 52  CHOLHDL 1.8  VLDL 7  LDLCALC 34    CBG: No results found for this basename: GLUCAP,  in the last 168 hours  Microbiology: No results found for this or any previous visit.  Coagulation Studies:  Recent Labs  03/17/13 1900 03/18/13 0455  LABPROT 25.4* 35.6*  INR 2.40* 3.74*    Imaging: Dg Hip Complete Left  03/17/2013   CLINICAL DATA:  Fall.  Left hip pain.  EXAM: LEFT HIP - COMPLETE 2+ VIEW  COMPARISON:  None.  FINDINGS: There is no evidence of hip fracture or dislocation. Mild left hip osteoarthritis noted.  No pelvic fracture identified. Severe right hip osteoarthritis demonstrated.  IMPRESSION: No acute findings.  Mild left and severe right hip osteoarthritis.   Electronically Signed   By: Earle Gell M.D.   On: 03/17/2013 20:36   Ct Head Wo Contrast  03/17/2013   CLINICAL DATA:  Slurred speech.  Difficulty ambulating.  EXAM: CT HEAD WITHOUT CONTRAST  TECHNIQUE: Contiguous axial images were obtained from the base of the skull through the vertex without intravenous contrast.  COMPARISON:  07/09/2012  FINDINGS: Sinuses/Soft tissues: Mucous retention cyst or polyp in the left maxillary sinus. Clear mastoid air cells.  Intracranial: Mild cerebral atrophy.  No mass lesion, hemorrhage, hydrocephalus, acute infarct, intra-axial, or extra-axial fluid collection.  IMPRESSION: 1.  No acute intracranial abnormality. 2. Sinus disease.   Electronically Signed   By: Abigail Miyamoto M.D.   On: 03/17/2013 19:35   Mr Brain Wo Contrast  03/18/2013   CLINICAL DATA:  Unsteady gait, ataxia, slurred speech  EXAM: MRI HEAD WITHOUT CONTRAST  TECHNIQUE: Multiplanar, multiecho pulse sequences of the brain and surrounding structures were obtained without intravenous contrast.  COMPARISON:  Prior CT from earlier the same day as well as prior MRI from 08/27/2011  FINDINGS: Mild diffuse prominence of the CSF containing spaces is compatible with generalized age-related atrophy. Mild scattered and confluent T2/FLAIR hyperintensity within the periventricular and deep white matter is present, nonspecific, but likely related to chronic microvascular ischemic disease.  No mass lesion or midline shift. No hydrocephalus. No extra-axial fluid collection.  No diffusion-weighted signal abnormality is identified to suggest acute intracranial infarct. Normal flow voids seen within the intracranial vasculature. Gray-white matter differentiation is maintained. No acute intracranial hemorrhage. Cystic encephalomalacia with wallerian degeneration seen within the right midbrain, compatible with remote right mid brain infarct, unchanged.  Craniocervical junction is within normal limits. Pituitary gland is normal. Globes and optic nerves demonstrate a normal appearance.  Calvarium demonstrates a normal appearance with normal signal intensity.  Probable retention cyst noted within the left maxillary sinus, incompletely visualized. Minimal opacity present within the right sphenoid sinus. Paranasal sinuses are otherwise clear. No mastoid effusion.  IMPRESSION: 1. No acute intracranial infarct or other abnormality. 2. Mild age-related atrophy with chronic microvascular ischemic disease. 3. Remote right mid brain  infarct, unchanged.   Electronically Signed   By: Jeannine Boga M.D.   On: 03/18/2013 00:10    Medications:  I have reviewed the patient's current medications. Scheduled: . atorvastatin  40 mg Oral Daily  . carbidopa-levodopa  1 tablet Oral BID  . divalproex  250 mg Oral BID  . lamoTRIgine  150 mg Oral BID  . lisinopril  10 mg Oral Daily  . omega-3 acid ethyl esters  1 g Oral Daily  . [START ON 03/19/2013] pneumococcal 23 valent vaccine  0.5 mL Intramuscular Tomorrow-1000  . sodium chloride  3 mL Intravenous Q12H  . warfarin  5 mg Oral Q M,W,F,Su-1800  . [START ON 03/19/2013] warfarin  7.5 mg Oral Q T,Th,Sat-1800  . Warfarin - Pharmacist Dosing Inpatient   Does not apply q1800    Assessment/Plan: Patient with na episode of agit instability and slurred speech.  Overnight with an episode of diplopia as well.  MRI of the brain reviewed and unremarkable.  Concerned about the posterior circulation.  Patient on Coumadin and INR supratherapeutic this morning at 3.74.   Recommendations: 1.  Pharmacy following INR and coumadin-target  INR 2-3. 2.  MRA today 3.  Echocardiogram and carotid dopplers pending.   Case discussed with Dr. Grandville Silos     LOS: 1 day   Alexis Goodell, MD [facility] [phone] 03/18/2013  8:33 AM

## 2013-03-18 NOTE — Evaluation (Signed)
Occupational Therapy Evaluation Patient Details Name: [name] [name] MRN: [id number] DOB: 1945/05/14 Today's Date: 03/18/2013 Time: 7628-3151 OT Time Calculation (min): 50 min  OT Assessment / Plan / Recommendation History of present illness 68 yo male admitted with gait deficits and visual changes . MRI (+) remote Rt midbrain infarct   Clinical Impression   PT admitted with s/p fall and diplopia. Pt currently with functional limitiations due to the deficits listed below (see OT problem list).  Pt will benefit from skilled OT to increase their independence and safety with adls and balance to allow discharge CIR. Pt with balance deficits, cognitive deficits and visual changes     OT Assessment  Patient needs continued OT Services    Follow Up Recommendations  CIR    Barriers to Discharge      Equipment Recommendations  Other (comment) (RW)    Recommendations for Other Services Rehab consult  Frequency  Min 3X/week    Precautions / Restrictions Precautions Precautions: Fall Precaution Comments: pt with Lt LE bruising from fall down steps   Pertinent Vitals/Pain None reported Reports poor appetite    ADL  Eating/Feeding: Supervision/safety Where Assessed - Eating/Feeding: Edge of bed Grooming: Wash/dry hands;Wash/dry face;Teeth care;Minimal assistance Where Assessed - Grooming: Supported standing (widen base of support) Toilet Transfer: Minimal assistance Toilet Transfer Method: Sit to stand Toilet Transfer Equipment: Regular height toilet;Grab bars Toileting - Clothing Manipulation and Hygiene: Minimal assistance Where Assessed - Toileting Clothing Manipulation and Hygiene: Sit to stand from 3-in-1 or toilet Equipment Used: Gait belt;Rolling walker Transfers/Ambulation Related to ADLs: Pt required use of RW and widen base of support. pt with knee flexion during static standing .  ADL Comments: Pt sitting EOB with bed alarm on arrival. pt impulsive and reports " i can walk  to the bathroom by myself with a RW" pt asked "why do you need a walker?" Pt responds "i have balance problems " pt has awareness enough to request a walker but impulsive to complete task alone. Pt demonstrates visual changes noted below. Pt with Rt Ue tremor that reports present for 1 year.     OT Diagnosis: Generalized weakness;Cognitive deficits;Apraxia  OT Problem List: Decreased strength;Decreased activity tolerance;Impaired balance (sitting and/or standing);Decreased coordination;Decreased cognition;Decreased safety awareness;Decreased knowledge of use of DME or AE;Decreased knowledge of precautions;Impaired vision/perception OT Treatment Interventions: Self-care/ADL training;Therapeutic exercise;Neuromuscular education;DME and/or AE instruction;Therapeutic activities;Cognitive remediation/compensation;Visual/perceptual remediation/compensation;Patient/family education;Balance training   OT Goals(Current goals can be found in the care plan section) Acute Rehab OT Goals Patient Stated Goal: to return home and not go on cruise with wife 04/01/13 OT Goal Formulation: With patient/family Time For Goal Achievement: 04/01/13 Potential to Achieve Goals: Good  Visit Information  Last OT Received On: 03/18/13 Assistance Needed: +2 (+1 ambulation in the room) History of Present Illness: 68 yo male admitted with gait deficits and visual changes . MRI (+) remote Rt midbrain infarct       Prior Functioning     Home Living Family/patient expects to be discharged to:: Inpatient rehab Living Arrangements: Spouse/significant other;Children Prior Function Level of Independence: Independent Communication Communication: No difficulties Dominant Hand: Right         Vision/Perception Vision - History Baseline Vision: Wears glasses for distance only Patient Visual Report: Diplopia Vision - Assessment Eye Alignment: Impaired (comment) Vision Assessment: Vision tested Ocular Range of Motion:  Within Functional Limits Tracking/Visual Pursuits: Decreased smoothness of horizontal tracking Convergence: Impaired (comment) Diplopia Assessment: Disappears with one eye closed;Objects split on top of one another;Present in  near gaze;Present in far gaze;Present all the time/all directions;Other (comment) Additional Comments: Pt reports in Rt midline horizontal peripheral visual field, lt superion and midline visual peripherial field  Praxis Praxis: Impaired Praxis Impairment Details: Motor planning   Cognition  Cognition Arousal/Alertness: Awake/alert Behavior During Therapy: WFL for tasks assessed/performed Overall Cognitive Status: Impaired/Different from baseline Area of Impairment: Orientation;Attention;Memory;Following commands;Safety/judgement;Awareness;Problem solving Orientation Level: Disoriented to;Time Current Attention Level: Selective Memory: Decreased short-term memory Following Commands: Follows multi-step commands with increased time Safety/Judgement: Decreased awareness of safety;Decreased awareness of deficits Awareness: Emergent;Anticipatory Problem Solving: Slow processing;Difficulty sequencing;Requires verbal cues;Requires tactile cues General Comments: Pt with poor recall of instructions. pt asked to wash hands and attempts to brush teeth for second time during session. Pt stopped by OT and then states "oh I already did that didnt I' pt needs cues to help with awareness to deficits    Extremity/Trunk Assessment Upper Extremity Assessment Upper Extremity Assessment: RUE deficits/detail RUE Deficits / Details: tremor, apraxic movement. Pt reports tremor started 1 year ago or less. pt reports RT UE tremor is much worse at this time Lower Extremity Assessment Lower Extremity Assessment: Defer to PT evaluation     Mobility Transfers Overall transfer level: Needs assistance Equipment used: Rolling walker (2 wheeled);1 person hand held assist Transfers: Sit to/from  Stand Sit to Stand: Min guard General transfer comment: pt with posterior lob with static standing and using bil LE braced against bed and toilet surfaces     Exercise     Balance Balance Overall balance assessment: Needs assistance Sitting-balance support: Bilateral upper extremity supported;Feet supported (widen base of support) Sitting balance-Leahy Scale: Fair   End of Session OT - End of Session Activity Tolerance: Patient tolerated treatment well Patient left: in bed;with call bell/phone within reach;with family/visitor present;with bed alarm set Nurse Communication: Mobility status;Precautions  GO     Peri Maris 03/18/2013, 2:23 PM Pager: [phone]

## 2013-03-18 NOTE — Progress Notes (Signed)
[facility] PROGRESS NOTE  Rockford Leinen JKD:[id number] DOB: 09-17-1945 DOA: 03/17/2013 PCP: Horatio Pel, MD  Assessment/Plan: #1 ataxia/slurred speech Questionable etiology. Concern for posterior circulatory stroke. Patient with clinical improvement. MRI of the head is negative. MRA is pending. 2-D echo is pending. Carotid Dopplers are pending. INR is supratherapeutic at 3.74. Neurology is following and appreciate input and recommendations.  #2 atrial fibrillation Currently rate controlled. Coumadin for anticoagulation.  #3 bipolar disorder Stable. Continue Lamictal and Depakote.  #4 hypertension Stable. Continue lisinopril.  #5 hyperlipidemia Continue Lipitor.  Code Status: full Family Communication: updated patient no family at bedside. Disposition Plan: CIR versus skilled nursing facility when medically stable.   Consultants:  Neurology: Dr. Aram Beecham 03/18/2013  Procedures:  MRI of the head 03/17/2013  Antibiotics:  none  HPI/Subjective: Patient with complaints of ataxia.patient with diplopia overnight.  Objective: Filed Vitals:   03/18/13 0530  BP: 109/62  Pulse: 79  Temp: 98.1 F (36.7 C)  Resp: 20    Intake/Output Summary (Last 24 hours) at 03/18/13 1124 Last data filed at 03/18/13 0000  Gross per 24 hour  Intake    240 ml  Output    350 ml  Net   -110 ml   Filed Weights   03/18/13 0001  Weight: 87.9 kg (193 lb 12.6 oz)    Exam:   General:  NAD  Cardiovascular: RRR  Respiratory: CTAB  Abdomen: soft, nontender, nondistended, positive bowel sounds.  Musculoskeletal: no clubbing cyanosis or edema.  Data Reviewed: Basic Metabolic Panel:  Recent Labs Lab 03/17/13 1900 03/17/13 1933 03/18/13 0455  NA 136* 136* 136*  K 4.2 4.0 4.2  CL 95* 96 98  CO2 25  --  25  GLUCOSE 105* 110* 100*  BUN 8 7 11   CREATININE 0.76 0.90 0.91  CALCIUM 10.4  --  9.5   Liver Function Tests:  Recent Labs Lab 03/17/13 1900  AST 21   ALT <5  ALKPHOS 103  BILITOT 0.6  PROT 8.0  ALBUMIN 4.6   No results found for this basename: LIPASE, AMYLASE,  in the last 168 hours No results found for this basename: AMMONIA,  in the last 168 hours CBC:  Recent Labs Lab 03/17/13 1900 03/17/13 1933 03/18/13 0455  WBC 4.6  --  6.4  NEUTROABS 2.6  --   --   HGB 15.3 16.7 13.0  HCT 44.4 49.0 37.8*  MCV 93.7  --  91.5  PLT 193  --  186   Cardiac Enzymes:  Recent Labs Lab 03/17/13 1900  TROPONINI <0.30   BNP (last 3 results) No results found for this basename: PROBNP,  in the last 8760 hours CBG: No results found for this basename: GLUCAP,  in the last 168 hours  No results found for this or any previous visit (from the past 240 hour(s)).   Studies: Dg Hip Complete Left  03/17/2013   CLINICAL DATA:  Fall.  Left hip pain.  EXAM: LEFT HIP - COMPLETE 2+ VIEW  COMPARISON:  None.  FINDINGS: There is no evidence of hip fracture or dislocation. Mild left hip osteoarthritis noted.  No pelvic fracture identified. Severe right hip osteoarthritis demonstrated.  IMPRESSION: No acute findings.  Mild left and severe right hip osteoarthritis.   Electronically Signed   By: Earle Gell M.D.   On: 03/17/2013 20:36   Ct Head Wo Contrast  03/17/2013   CLINICAL DATA:  Slurred speech.  Difficulty ambulating.  EXAM: CT HEAD WITHOUT CONTRAST  TECHNIQUE: Contiguous axial  images were obtained from the base of the skull through the vertex without intravenous contrast.  COMPARISON:  07/09/2012  FINDINGS: Sinuses/Soft tissues: Mucous retention cyst or polyp in the left maxillary sinus. Clear mastoid air cells.  Intracranial: Mild cerebral atrophy. No mass lesion, hemorrhage, hydrocephalus, acute infarct, intra-axial, or extra-axial fluid collection.  IMPRESSION: 1.  No acute intracranial abnormality. 2. Sinus disease.   Electronically Signed   By: Abigail Miyamoto M.D.   On: 03/17/2013 19:35   Mr Brain Wo Contrast  03/18/2013   CLINICAL DATA:  Unsteady  gait, ataxia, slurred speech  EXAM: MRI HEAD WITHOUT CONTRAST  TECHNIQUE: Multiplanar, multiecho pulse sequences of the brain and surrounding structures were obtained without intravenous contrast.  COMPARISON:  Prior CT from earlier the same day as well as prior MRI from 08/27/2011  FINDINGS: Mild diffuse prominence of the CSF containing spaces is compatible with generalized age-related atrophy. Mild scattered and confluent T2/FLAIR hyperintensity within the periventricular and deep white matter is present, nonspecific, but likely related to chronic microvascular ischemic disease.  No mass lesion or midline shift. No hydrocephalus. No extra-axial fluid collection.  No diffusion-weighted signal abnormality is identified to suggest acute intracranial infarct. Normal flow voids seen within the intracranial vasculature. Gray-white matter differentiation is maintained. No acute intracranial hemorrhage. Cystic encephalomalacia with wallerian degeneration seen within the right midbrain, compatible with remote right mid brain infarct, unchanged.  Craniocervical junction is within normal limits. Pituitary gland is normal. Globes and optic nerves demonstrate a normal appearance.  Calvarium demonstrates a normal appearance with normal signal intensity.  Probable retention cyst noted within the left maxillary sinus, incompletely visualized. Minimal opacity present within the right sphenoid sinus. Paranasal sinuses are otherwise clear. No mastoid effusion.  IMPRESSION: 1. No acute intracranial infarct or other abnormality. 2. Mild age-related atrophy with chronic microvascular ischemic disease. 3. Remote right mid brain infarct, unchanged.   Electronically Signed   By: Jeannine Boga M.D.   On: 03/18/2013 00:10    Scheduled Meds: . atorvastatin  40 mg Oral Daily  . carbidopa-levodopa  1 tablet Oral BID  . divalproex  250 mg Oral BID  . lamoTRIgine  150 mg Oral BID  . lisinopril  10 mg Oral Daily  . omega-3 acid  ethyl esters  1 g Oral Daily  . [START ON 03/19/2013] pneumococcal 23 valent vaccine  0.5 mL Intramuscular Tomorrow-1000  . sodium chloride  3 mL Intravenous Q12H  . warfarin  5 mg Oral Q M,W,F,Su-1800  . [START ON 03/19/2013] warfarin  7.5 mg Oral Q T,Th,Sat-1800  . Warfarin - Pharmacist Dosing Inpatient   Does not apply q1800   Continuous Infusions:   Active Problems:   TIA (transient ischemic attack)   Gait instability   Atrial fibrillation   Unspecified essential hypertension    Time spent: 35 minutes    [facility] MD [facility] Pager [phone]. If 7PM-7AM, please contact night-coverage at [email or web address], password [email or web address] 03/18/2013, 11:24 AM  LOS: 1 day

## 2013-03-18 NOTE — Progress Notes (Signed)
*  PRELIMINARY RESULTS* Vascular Ultrasound Carotid Duplex (Doppler) has been completed.  Preliminary findings: Bilateral:  1-39% ICA stenosis.  Vertebral artery flow is antegrade.      Landry Mellow, RDMS, RVT  03/18/2013, 10:44 AM

## 2013-03-18 NOTE — Progress Notes (Signed)
Utilization review completed. Gurpreet Mikhail, RN, BSN. 

## 2013-03-18 NOTE — Progress Notes (Signed)
ANTICOAGULATION CONSULT NOTE - Follow Up Consult  Pharmacy Consult:  Coumadin Indication: atrial fibrillation  No Known Allergies  Patient Measurements: Height: 5\' 11"  (180.3 cm) Weight: 193 lb 12.6 oz (87.9 kg) IBW/kg (Calculated) : 75.3  Vital Signs: Temp: 98.3 F (36.8 C) (01/16 1200) Temp src: Oral (01/16 1200) BP: 131/81 mmHg (01/16 1200) Pulse Rate: 82 (01/16 1200)  Labs:  Recent Labs  03/17/13 1900 03/17/13 1933 03/18/13 0455  HGB 15.3 16.7 13.0  HCT 44.4 49.0 37.8*  PLT 193  --  186  APTT 38*  --   --   LABPROT 25.4*  --  35.6*  INR 2.40*  --  3.74*  CREATININE 0.76 0.90 0.91  TROPONINI <0.30  --   --     Estimated Creatinine Clearance: 81.6 ml/min (by C-G formula based on Cr of 0.91).     Assessment: 55 YOM on Coumadin for history of Afib.  INR supra-therapeutic today; no bleeding reported.   Goal of Therapy:  INR 2-3 Monitor platelets by anticoagulation protocol: Yes    Plan:  - D/C standing Coumadin order - No Coumadin today - Daily PT / INR    Mitchelle Goerner D. Mina Marble, PharmD, BCPS Pager:  [phone] 03/18/2013, 1:38 PM

## 2013-03-19 LAB — BASIC METABOLIC PANEL
BUN: 17 mg/dL (ref 6–23)
CHLORIDE: 102 meq/L (ref 96–112)
CO2: 30 meq/L (ref 19–32)
CREATININE: 1.13 mg/dL (ref 0.50–1.35)
Calcium: 9.6 mg/dL (ref 8.4–10.5)
GFR calc Af Amer: 75 mL/min — ABNORMAL LOW (ref >90)
GFR calc non Af Amer: 64 mL/min — ABNORMAL LOW (ref >90)
GLUCOSE: 90 mg/dL (ref 70–99)
POTASSIUM: 5.2 meq/L (ref 3.7–5.3)
Sodium: 141 mEq/L (ref 137–147)

## 2013-03-19 LAB — PROTIME-INR
INR: 5.69 (ref 0.00–1.49)
Prothrombin Time: 48.7 seconds — ABNORMAL HIGH (ref 11.6–15.2)

## 2013-03-19 NOTE — Progress Notes (Signed)
Clinical Social Work Department CLINICAL SOCIAL WORK PLACEMENT NOTE 03/19/2013  Patient:  [name] [name], [name] [name]  Account Number:  [id number] Admit date:  09/24/2007  Clinical Social Worker:  Blima Rich, Latanya Presser  Date/time:  03/19/2013 05:47 PM  Clinical Social Work is seeking post-discharge placement for this patient at the following level of care:   SKILLED NURSING   (*CSW will update this form in Epic as items are completed)   03/19/2013  Patient/family provided with [place] Department of Clinical Social Work's list of facilities offering this level of care within the geographic area requested by the patient (or if unable, by the patient's family).  03/19/2013  Patient/family informed of their freedom to choose among providers that offer the needed level of care, that participate in Medicare, Medicaid or managed care program needed by the patient, have an available bed and are willing to accept the patient.  03/19/2013  Patient/family informed of [facility]' ownership interest in [facility], as well as of the fact that they are under no obligation to receive care at this facility.  PASARR submitted to EDS on 03/19/2013 PASARR number received from EDS on 03/19/2013  FL2 transmitted to all facilities in geographic area requested by pt/family on  03/19/2013 FL2 transmitted to all facilities within larger geographic area on   Patient informed that his/her managed care company has contracts with or will negotiate with  certain facilities, including the following:     Patient/family informed of bed offers received:   Patient chooses bed at  Physician recommends and patient chooses bed at    Patient to be transferred to  on   Patient to be transferred to facility by   The following physician request were entered in Epic:   Additional Comments:

## 2013-03-19 NOTE — Progress Notes (Signed)
Pt. Has home cpap. Pt. States he can place it on himself. RT informed pt. To notify if he needs any assistance.

## 2013-03-19 NOTE — Progress Notes (Signed)
Clinical Social Work Department BRIEF PSYCHOSOCIAL ASSESSMENT 03/19/2013  Patient:  [name] [name]     Account Number:  [id number]     Admit date:  09/24/2007  Clinical Social Worker:  Rolinda Roan  Date/Time:  03/19/2013 05:40 PM  Referred by:  Physician  Date Referred:  03/18/2013 Referred for  SNF Placement   Other Referral:   Interview type:  Patient Other interview type:    PSYCHOSOCIAL DATA Living Status:  WIFE Admitted from facility:   Level of care:   Primary support name:  [name] [name] [phone] Primary support relationship to patient:  CHILD, ADULT Degree of support available:   Very supportive.    CURRENT CONCERNS  Other Concerns:    SOCIAL WORK ASSESSMENT / PLAN Clinical Social Worker (CSW) met with patient to discuss D/C plan. Patient's dauhgter [name] [name], wife, and son are at bedside. Patient agreeable to SNF search in [facility]. Wife reported that [facility] are preferances at this point. CSW explained to patient and family that if he is medically stable for D/C on Sunday 03/20/13 then they will have to choose a SNF that will accept them on the weekends. Patient verbalized his understanding.   Assessment/plan status:  Psychosocial Support/Ongoing Assessment of Needs Other assessment/ plan:   Information/referral to community resources:   CSW gave SNF list.    PATIENT'S/FAMILY'S RESPONSE TO PLAN OF CARE: Patient and family thanked CSW. Daughter reported that she is relieved that there is a clear plan for rehab.

## 2013-03-19 NOTE — Progress Notes (Signed)
Patient is not medically stable for D/C today per MD. CSW will continue to assist with D/C needs.   Blima Rich, [place] Weekend CSW [phone]

## 2013-03-19 NOTE — Progress Notes (Signed)
Dr. Grandville Silos called back with no order for the INR of 5.69 but verbally stated that we will need to hold his oral coumadin. Oncoming nurse aware.   Ave Filter, RN

## 2013-03-19 NOTE — Progress Notes (Signed)
[facility] PROGRESS NOTE  Asahel Grealish [id number] DOB: 06-Mar-1945 DOA: 03/17/2013 PCP: Horatio Pel, MD  Assessment/Plan: #1 ataxia/slurred speech Questionable etiology. Concern for posterior circulatory stroke. Patient with clinical improvement. MRI of the head is negative. MRA is pending. 2-D echo is with no source of emboli. Carotid Dopplers with no significant ICA stenosis. INR is supratherapeutic at 5.69 from 3.74. Patient will likely need to follow up with neurology as outpatient. Neurology is following and appreciate input and recommendations.  #2 atrial fibrillation Currently rate controlled. INR is elevated at 5.69 from 3.74. Hold Coumadin today. Follow INR.  #3 bipolar disorder Stable. Continue Lamictal and Depakote.  #4 hypertension Stable. Continue lisinopril.  #5 hyperlipidemia Continue Lipitor.  Code Status: full Family Communication: updated patient no family at bedside. Disposition Plan: CIR versus skilled nursing facility when medically stable.   Consultants:  Neurology: Dr. Aram Beecham 03/18/2013  Procedures:  MRI of the head 03/17/2013  2-D echo 03/18/2013  Carotid Dopplers 03/18/2013  Antibiotics:  none  HPI/Subjective: Patient feeling better.  Objective: Filed Vitals:   03/19/13 1407  BP: 140/72  Pulse: 76  Temp: 98.4 F (36.9 C)  Resp: 18    Intake/Output Summary (Last 24 hours) at 03/19/13 1455 Last data filed at 03/19/13 0915  Gross per 24 hour  Intake      0 ml  Output    275 ml  Net   -275 ml   Filed Weights   03/18/13 0001  Weight: 87.9 kg (193 lb 12.6 oz)    Exam:   General:  NAD  Cardiovascular: RRR  Respiratory: CTAB  Abdomen: soft, nontender, nondistended, positive bowel sounds.  Musculoskeletal: no clubbing cyanosis or edema.  Data Reviewed: Basic Metabolic Panel:  Recent Labs Lab 03/17/13 1900 03/17/13 1933 03/18/13 0455 03/19/13 0439  NA 136* 136* 136* 141  K 4.2 4.0 4.2 5.2   CL 95* 96 98 102  CO2 25  --  25 30  GLUCOSE 105* 110* 100* 90  BUN 8 7 11 17   CREATININE 0.76 0.90 0.91 1.13  CALCIUM 10.4  --  9.5 9.6   Liver Function Tests:  Recent Labs Lab 03/17/13 1900  AST 21  ALT <5  ALKPHOS 103  BILITOT 0.6  PROT 8.0  ALBUMIN 4.6   No results found for this basename: LIPASE, AMYLASE,  in the last 168 hours No results found for this basename: AMMONIA,  in the last 168 hours CBC:  Recent Labs Lab 03/17/13 1900 03/17/13 1933 03/18/13 0455  WBC 4.6  --  6.4  NEUTROABS 2.6  --   --   HGB 15.3 16.7 13.0  HCT 44.4 49.0 37.8*  MCV 93.7  --  91.5  PLT 193  --  186   Cardiac Enzymes:  Recent Labs Lab 03/17/13 1900  TROPONINI <0.30   BNP (last 3 results) No results found for this basename: PROBNP,  in the last 8760 hours CBG: No results found for this basename: GLUCAP,  in the last 168 hours  No results found for this or any previous visit (from the past 240 hour(s)).   Studies: Dg Hip Complete Left  03/17/2013   CLINICAL DATA:  Fall.  Left hip pain.  EXAM: LEFT HIP - COMPLETE 2+ VIEW  COMPARISON:  None.  FINDINGS: There is no evidence of hip fracture or dislocation. Mild left hip osteoarthritis noted.  No pelvic fracture identified. Severe right hip osteoarthritis demonstrated.  IMPRESSION: No acute findings.  Mild left and severe right hip osteoarthritis.  Electronically Signed   By: Earle Gell M.D.   On: 03/17/2013 20:36   Ct Head Wo Contrast  03/17/2013   CLINICAL DATA:  Slurred speech.  Difficulty ambulating.  EXAM: CT HEAD WITHOUT CONTRAST  TECHNIQUE: Contiguous axial images were obtained from the base of the skull through the vertex without intravenous contrast.  COMPARISON:  07/09/2012  FINDINGS: Sinuses/Soft tissues: Mucous retention cyst or polyp in the left maxillary sinus. Clear mastoid air cells.  Intracranial: Mild cerebral atrophy. No mass lesion, hemorrhage, hydrocephalus, acute infarct, intra-axial, or extra-axial fluid  collection.  IMPRESSION: 1.  No acute intracranial abnormality. 2. Sinus disease.   Electronically Signed   By: Abigail Miyamoto M.D.   On: 03/17/2013 19:35   Mr Jodene Nam Head Wo Contrast  03/18/2013   CLINICAL DATA:  TIA.  Unsteady gait, ataxia, slurred speech.  EXAM: MRA HEAD WITHOUT CONTRAST  TECHNIQUE: Angiographic images of the Circle of Willis were obtained using MRA technique without intravenous contrast.  COMPARISON:  None.  FINDINGS: The visualized distal vertebral arteries are patent. The right vertebral artery is dominant, with the left being markedly hypoplastic distal to the PICA origin. PICA origins are patent bilaterally. The basilar artery is patent without stenosis. SCAs are patent. The P1 segments of the PCAs are hypoplastic bilaterally with prominent bilateral posterior communicating arteries present. The PCAs are otherwise unremarkable.  The internal carotid arteries are patent from skullbase to carotid terminus. ACA and MCA origins and visualized branches are patent and unremarkable. A small anterior communicating artery is present. No intracranial aneurysm is identified.  IMPRESSION: Unremarkable head MRA. No evidence of major intracranial arterial occlusion or flow limiting stenosis.   Electronically Signed   By: Logan Bores   On: 03/18/2013 14:55   Mr Brain Wo Contrast  03/18/2013   CLINICAL DATA:  Unsteady gait, ataxia, slurred speech  EXAM: MRI HEAD WITHOUT CONTRAST  TECHNIQUE: Multiplanar, multiecho pulse sequences of the brain and surrounding structures were obtained without intravenous contrast.  COMPARISON:  Prior CT from earlier the same day as well as prior MRI from 08/27/2011  FINDINGS: Mild diffuse prominence of the CSF containing spaces is compatible with generalized age-related atrophy. Mild scattered and confluent T2/FLAIR hyperintensity within the periventricular and deep white matter is present, nonspecific, but likely related to chronic microvascular ischemic disease.  No mass  lesion or midline shift. No hydrocephalus. No extra-axial fluid collection.  No diffusion-weighted signal abnormality is identified to suggest acute intracranial infarct. Normal flow voids seen within the intracranial vasculature. Gray-white matter differentiation is maintained. No acute intracranial hemorrhage. Cystic encephalomalacia with wallerian degeneration seen within the right midbrain, compatible with remote right mid brain infarct, unchanged.  Craniocervical junction is within normal limits. Pituitary gland is normal. Globes and optic nerves demonstrate a normal appearance.  Calvarium demonstrates a normal appearance with normal signal intensity.  Probable retention cyst noted within the left maxillary sinus, incompletely visualized. Minimal opacity present within the right sphenoid sinus. Paranasal sinuses are otherwise clear. No mastoid effusion.  IMPRESSION: 1. No acute intracranial infarct or other abnormality. 2. Mild age-related atrophy with chronic microvascular ischemic disease. 3. Remote right mid brain infarct, unchanged.   Electronically Signed   By: Jeannine Boga M.D.   On: 03/18/2013 00:10    Scheduled Meds: . atorvastatin  40 mg Oral Daily  . carbidopa-levodopa  1 tablet Oral BID  . divalproex  250 mg Oral BID  . lamoTRIgine  150 mg Oral BID  . lisinopril  10 mg  Oral Daily  . omega-3 acid ethyl esters  1 g Oral Daily  . pneumococcal 23 valent vaccine  0.5 mL Intramuscular Tomorrow-1000  . sodium chloride  3 mL Intravenous Q12H  . Warfarin - Pharmacist Dosing Inpatient   Does not apply q1800   Continuous Infusions:   Active Problems:   TIA (transient ischemic attack)   Gait instability   Atrial fibrillation   Unspecified essential hypertension   Brain TIA    Time spent: 35 minutes    [facility] MD [facility] Pager [phone]. If 7PM-7AM, please contact night-coverage at [email or web address], password [facility] 03/19/2013, 2:55 PM  LOS: 2 days

## 2013-03-19 NOTE — Progress Notes (Signed)
ANTICOAGULATION CONSULT NOTE - Follow Up Consult  Pharmacy Consult:  Coumadin Indication: atrial fibrillation  No Known Allergies  Patient Measurements: Height: 5\' 11"  (180.3 cm) Weight: 193 lb 12.6 oz (87.9 kg) IBW/kg (Calculated) : 75.3  Vital Signs: Temp: 98.4 F (36.9 C) (01/17 1407) Temp src: Oral (01/17 1407) BP: 140/72 mmHg (01/17 1407) Pulse Rate: 76 (01/17 1407)  Labs:  Recent Labs  03/17/13 1900 03/17/13 1933 03/18/13 0455 03/19/13 0439  HGB 15.3 16.7 13.0  --   HCT 44.4 49.0 37.8*  --   PLT 193  --  186  --   APTT 38*  --   --   --   LABPROT 25.4*  --  35.6* 48.7*  INR 2.40*  --  3.74* 5.69*  CREATININE 0.76 0.90 0.91 1.13  TROPONINI <0.30  --   --   --     Estimated Creatinine Clearance: 65.7 ml/min (by C-G formula based on Cr of 1.13).   Assessment: 12 YOM on Coumadin for history of Afib.  INR supra-therapeutic today; no bleeding reported, although nursing note reports large bruise on patient's left leg.  Will monitor.  Only 1 dose of Coumadin given in hospital overnight on 1/15.   Goal of Therapy:  INR 2-3 Monitor platelets by anticoagulation protocol: Yes    Plan:  - D/C standing Coumadin order - No Coumadin today - Daily PT / INR  Uvaldo Rising, BCPS  Clinical Pharmacist Pager [phone]  03/19/2013 2:39 PM

## 2013-03-19 NOTE — Significant Event (Addendum)
Critical INR of 5.69. No active bleeding noted. Patient does have a large hematoma/bruise on his left hip down to his upper leg area.  Dr. Grandville Silos paged. Awaiting on order. Oncoming nurse aware. Will continue to monitor.  Renetta Suman

## 2013-03-19 NOTE — Progress Notes (Signed)
Subjective: Patient gives a very different history today.  Reports that he has continued to have difficulty with gait and double vision.  Reports the double vision is better when the eye is patched.  Then halfway through the evaluation reports that he no longer has double vision. Reports that he had these symptoms tow times previously and was evaluated at [facility].  Reports that basically the same tests were done but were all unremarkable.  He is not sure how long his symptoms lasted those times and if he left the hospital with his symptoms continuing.    He is requiring a walker at this time.  His last INR 5.69.    Objective: Current vital signs: BP 128/83  Pulse 78  Temp(Src) 98.4 F (36.9 C) (Oral)  Resp 18  Ht 5\' 11"  (1.803 m)  Wt 87.9 kg (193 lb 12.6 oz)  BMI 27.04 kg/m2  SpO2 99% Vital signs in last 24 hours: Temp:  [97.2 F (36.2 C)-98.4 F (36.9 C)] 98.4 F (36.9 C) (01/17 0600) Pulse Rate:  [63-89] 78 (01/17 0600) Resp:  [18-20] 18 (01/17 0600) BP: (113-131)/(62-83) 128/83 mmHg (01/17 0600) SpO2:  [99 %-100 %] 99 % (01/17 0600)  Intake/Output from previous day:   Intake/Output this shift:   Nutritional status: Cardiac  Neurologic Exam: Mental Status:  Alert, oriented, thought content appropriate. Speech fluent without evidence of aphasia. Able to follow 3 step commands without difficulty.  Cranial Nerves:  II: Discs flat bilaterally; Visual fields grossly normal, pupils equal, round, reactive to light and accommodation  III,IV, VI: ptosis not present, extra-ocular motions intact bilaterally  V,VII: smile symmetric, facial light touch sensation normal bilaterally  VIII: hearing normal bilaterally  IX,X: gag reflex present  XI: bilateral shoulder shrug  XII: midline tongue extension without atrophy or fasciculations  Motor:  5/5 throughout  Sensory: Pinprick and light touch intact throughout, bilaterally  Deep Tendon Reflexes:  1 throughout  Plantars:  Right:  downgoing    Left: downgoing  Cerebellar:  Mild left dysmetria with finger to nose testing.  Dysmetria with left heel to shin testing   Lab Results: Basic Metabolic Panel:  Recent Labs Lab 03/17/13 1900 03/17/13 1933 03/18/13 0455 03/19/13 0439  NA 136* 136* 136* 141  K 4.2 4.0 4.2 5.2  CL 95* 96 98 102  CO2 25  --  25 30  GLUCOSE 105* 110* 100* 90  BUN 8 7 11 17   CREATININE 0.76 0.90 0.91 1.13  CALCIUM 10.4  --  9.5 9.6    Liver Function Tests:  Recent Labs Lab 03/17/13 1900  AST 21  ALT <5  ALKPHOS 103  BILITOT 0.6  PROT 8.0  ALBUMIN 4.6   No results found for this basename: LIPASE, AMYLASE,  in the last 168 hours No results found for this basename: AMMONIA,  in the last 168 hours  CBC:  Recent Labs Lab 03/17/13 1900 03/17/13 1933 03/18/13 0455  WBC 4.6  --  6.4  NEUTROABS 2.6  --   --   HGB 15.3 16.7 13.0  HCT 44.4 49.0 37.8*  MCV 93.7  --  91.5  PLT 193  --  186    Cardiac Enzymes:  Recent Labs Lab 03/17/13 1900  TROPONINI <0.30    Lipid Panel:  Recent Labs Lab 03/18/13 0453  CHOL 93  TRIG 34  HDL 52  CHOLHDL 1.8  VLDL 7  LDLCALC 34    CBG: No results found for this basename: GLUCAP,  in the last  168 hours  Microbiology: No results found for this or any previous visit.  Coagulation Studies:  Recent Labs  03/17/13 1900 03/18/13 0455 03/19/13 0439  LABPROT 25.4* 35.6* 48.7*  INR 2.40* 3.74* 5.69*    Imaging: Dg Hip Complete Left  03/17/2013   CLINICAL DATA:  Fall.  Left hip pain.  EXAM: LEFT HIP - COMPLETE 2+ VIEW  COMPARISON:  None.  FINDINGS: There is no evidence of hip fracture or dislocation. Mild left hip osteoarthritis noted.  No pelvic fracture identified. Severe right hip osteoarthritis demonstrated.  IMPRESSION: No acute findings.  Mild left and severe right hip osteoarthritis.   Electronically Signed   By: Earle Gell M.D.   On: 03/17/2013 20:36   Ct Head Wo Contrast  03/17/2013   CLINICAL DATA:  Slurred  speech.  Difficulty ambulating.  EXAM: CT HEAD WITHOUT CONTRAST  TECHNIQUE: Contiguous axial images were obtained from the base of the skull through the vertex without intravenous contrast.  COMPARISON:  07/09/2012  FINDINGS: Sinuses/Soft tissues: Mucous retention cyst or polyp in the left maxillary sinus. Clear mastoid air cells.  Intracranial: Mild cerebral atrophy. No mass lesion, hemorrhage, hydrocephalus, acute infarct, intra-axial, or extra-axial fluid collection.  IMPRESSION: 1.  No acute intracranial abnormality. 2. Sinus disease.   Electronically Signed   By: Abigail Miyamoto M.D.   On: 03/17/2013 19:35   Mr Jodene Nam Head Wo Contrast  03/18/2013   CLINICAL DATA:  TIA.  Unsteady gait, ataxia, slurred speech.  EXAM: MRA HEAD WITHOUT CONTRAST  TECHNIQUE: Angiographic images of the Circle of Willis were obtained using MRA technique without intravenous contrast.  COMPARISON:  None.  FINDINGS: The visualized distal vertebral arteries are patent. The right vertebral artery is dominant, with the left being markedly hypoplastic distal to the PICA origin. PICA origins are patent bilaterally. The basilar artery is patent without stenosis. SCAs are patent. The P1 segments of the PCAs are hypoplastic bilaterally with prominent bilateral posterior communicating arteries present. The PCAs are otherwise unremarkable.  The internal carotid arteries are patent from skullbase to carotid terminus. ACA and MCA origins and visualized branches are patent and unremarkable. A small anterior communicating artery is present. No intracranial aneurysm is identified.  IMPRESSION: Unremarkable head MRA. No evidence of major intracranial arterial occlusion or flow limiting stenosis.   Electronically Signed   By: Logan Bores   On: 03/18/2013 14:55   Mr Brain Wo Contrast  03/18/2013   CLINICAL DATA:  Unsteady gait, ataxia, slurred speech  EXAM: MRI HEAD WITHOUT CONTRAST  TECHNIQUE: Multiplanar, multiecho pulse sequences of the brain and  surrounding structures were obtained without intravenous contrast.  COMPARISON:  Prior CT from earlier the same day as well as prior MRI from 08/27/2011  FINDINGS: Mild diffuse prominence of the CSF containing spaces is compatible with generalized age-related atrophy. Mild scattered and confluent T2/FLAIR hyperintensity within the periventricular and deep white matter is present, nonspecific, but likely related to chronic microvascular ischemic disease.  No mass lesion or midline shift. No hydrocephalus. No extra-axial fluid collection.  No diffusion-weighted signal abnormality is identified to suggest acute intracranial infarct. Normal flow voids seen within the intracranial vasculature. Gray-white matter differentiation is maintained. No acute intracranial hemorrhage. Cystic encephalomalacia with wallerian degeneration seen within the right midbrain, compatible with remote right mid brain infarct, unchanged.  Craniocervical junction is within normal limits. Pituitary gland is normal. Globes and optic nerves demonstrate a normal appearance.  Calvarium demonstrates a normal appearance with normal signal intensity.  Probable retention  cyst noted within the left maxillary sinus, incompletely visualized. Minimal opacity present within the right sphenoid sinus. Paranasal sinuses are otherwise clear. No mastoid effusion.  IMPRESSION: 1. No acute intracranial infarct or other abnormality. 2. Mild age-related atrophy with chronic microvascular ischemic disease. 3. Remote right mid brain infarct, unchanged.   Electronically Signed   By: Jeannine Boga M.D.   On: 03/18/2013 00:10    Medications:  I have reviewed the patient's current medications. Scheduled: . atorvastatin  40 mg Oral Daily  . carbidopa-levodopa  1 tablet Oral BID  . divalproex  250 mg Oral BID  . lamoTRIgine  150 mg Oral BID  . lisinopril  10 mg Oral Daily  . omega-3 acid ethyl esters  1 g Oral Daily  . pneumococcal 23 valent vaccine  0.5  mL Intramuscular Tomorrow-1000  . sodium chloride  3 mL Intravenous Q12H  . Warfarin - Pharmacist Dosing Inpatient   Does not apply q1800    Assessment/Plan: Patient very inconsistent with history.  Unclear etiology of symptoms.  MRI of the brain unremarkable.  MRA reviewed and unremarkable as well.  Echocardiogram and carotid dopplers are unremarkable.    Recommendations: 1.  Therapy evaluating patient.  It seems symptoms have resolved in the past.  Will continue to follow in hopes that this will occur this time as well.  Patient on Coumadin 2. Patient to follow up with neurology on an outpatient basis.   LOS: 2 days   Alexis Goodell, MD [facility] [phone] 03/19/2013  9:12 AM

## 2013-03-19 NOTE — Progress Notes (Signed)
Occupational Therapy Treatment Patient Details Name: [name] [name] MRN: [id number] DOB: September 15, 1945 Today's Date: 03/19/2013 Time: 4580-9983 OT Time Calculation (min): 15 min  OT Assessment / Plan / Recommendation  History of present illness 68 yo male admitted with gait deficits and visual changes . MRI (+) remote Rt midbrain infarct   OT comments  Seen this pm to further assess ADL/mobility. Pt min A with mobility for ADL. No reports ofdiplopia during session. Pt using RW with improved gait and balance. Continue to recommend short CIR stay to improve pt's maximize pt's level of independence with ADL and mobility to decrease fall risk and chances of readmission. Also rec neuro opthamology consult.  Follow Up Recommendations  CIR    Barriers to Discharge       Equipment Recommendations  Other (comment)    Recommendations for Other Services Rehab consult  Frequency Min 3X/week   Progress towards OT Goals Progress towards OT goals: Progressing toward goals  Plan Discharge plan remains appropriate    Precautions / Restrictions Precautions Precautions: Fall Precaution Comments: occ diplopia   Pertinent Vitals/Pain no apparent distress     ADL  Eating/Feeding: Modified independent Where Assessed - Eating/Feeding: Edge of bed Grooming: Min guard Where Assessed - Grooming: Supported standing Upper Body Bathing: Supervision/safety;Set up Where Assessed - Upper Body Bathing: Unsupported sitting Lower Body Bathing: Minimal assistance Where Assessed - Lower Body Bathing: Supported sit to stand Where Assessed - Upper Body Dressing: Unsupported sitting Lower Body Dressing: Minimal assistance Where Assessed - Lower Body Dressing: Supported sit to stand Toilet Transfer: Minimal assistance Toilet Transfer Method: Sit to Loss adjuster, chartered: Regular height toilet;Grab bars Equipment Used: Gait belt;Rolling walker Transfers/Ambulation Related to ADLs: Pt required use of RW  and widen base of support. pt with knee flexion during static standing .  ADL Comments: further assessed vision. Pt states that he feels off balance at times. Diplopia appears worse with near L gaze. On assessment. noted that R eye does not adduct during convergence. However, able to adduct during saccades.    OT Diagnosis:    OT Problem List:   OT Treatment Interventions:     OT Goals(current goals can now be found in the care plan section) Acute Rehab OT Goals Patient Stated Goal: to return home and not go on cruise with wife 04/01/13 OT Goal Formulation: With patient/family Time For Goal Achievement: 04/01/13 Potential to Achieve Goals: Good ADL Goals Pt Will Perform Grooming: with min guard assist;standing Pt Will Perform Upper Body Bathing: with min guard assist;standing Pt Will Perform Lower Body Bathing: with min guard assist;sit to/from stand Pt Will Perform Upper Body Dressing: with min guard assist;standing Pt Will Perform Lower Body Dressing: with min guard assist;sit to/from stand Pt Will Transfer to Toilet: with min guard assist;ambulating;regular height toilet Additional ADL Goal #1: pt will complete transfer without DME min guard (A) Additional ADL Goal #2: Pt will static stand ~3 minutes without UE support or LOB  Visit Information  Last OT Received On: 03/19/13 Assistance Needed: +1 History of Present Illness: 68 yo male admitted with gait deficits and visual changes . MRI (+) remote Rt midbrain infarct    Subjective Data      Prior Functioning       Cognition  Cognition Arousal/Alertness: Awake/alert Behavior During Therapy: WFL for tasks assessed/performed Overall Cognitive Status: Impaired/Different from baseline    Mobility  Bed Mobility Overal bed mobility: Needs Assistance Bed Mobility: Supine to Sit Supine to sit: Supervision Transfers Overall  transfer level: Needs assistance Sit to Stand: Min assist    Exercises      Balance Balance Sitting  balance-Leahy Scale: Fair  End of Session OT - End of Session Equipment Utilized During Treatment: Gait belt;Rolling walker Activity Tolerance: Patient tolerated treatment well Patient left: in bed;with call bell/phone within reach;with bed alarm set;with family/visitor present Nurse Communication: Mobility status;Precautions  GO     Kelty Szafran,HILLARY 03/19/2013, 5:32 PM [facility], OTR/L  [phone] 03/19/2013

## 2013-03-19 NOTE — Progress Notes (Signed)
Occupational Therapy Treatment Patient Details Name: [name] [name] MRN: [id number] DOB: 02-27-1946 Today's Date: 03/19/2013 Time: 1501-1530 OT Time Calculation (min): 29 min  OT Assessment / Plan / Recommendation  History of present illness 68 yo male admitted with gait deficits and visual changes . MRI (+) remote Rt midbrain infarct   OT comments  Vision further assessed. Pt does not report diplopia at this time. During assessment, noted that R eye adduction is weak. Pt unable to converge on target due to decreased adduction R eye. However, able to reproduce diplopia in L gaze - which is expected with difficulty with convergence. Also noted slow and uncoordinated saccades.   Per MRI, Pt with Cystic encephalomalacia with wallerian degeneration seen within the right midbrain, compatible with remote right mid brain infarct. Pt experiencing symptoms compatible with lesion in this area (superior colliculus). Visual deficits are common, in addition to feelings of being off balance due the inability to orient the head/eyes to visual stimuli. Family reports that pt has not been taking meds as prescribed and that condition has worsened. Pt states that "with each day after being in the hospital, I get better". ? If pt's baseline visual symptoms/deficits have been exacerbated by medication mismanagement.   Feel pt would benefit from CIR to max functional level of independence to facilitate safe D/C home with family.  Feel pt needs to follow up with neuro opthamologist who can address deficits associated with the visual system at the midbrain level.   Will complete assessment this pm as MD in room with pt.  Follow Up Recommendations  CIR    Barriers to Discharge       Equipment Recommendations  Other (comment)    Recommendations for Other Services Rehab consult Follow up with neuro opthamologist  Frequency Min 3X/week   Progress towards OT Goals Progress towards OT goals: Progressing toward  goals  Plan Discharge plan remains appropriate    Precautions / Restrictions Precautions Precautions: Fall Precaution Comments: occ diplopia   Pertinent Vitals/Pain no apparent distress     ADL  Eating/Feeding: Modified independent Where Assessed - Eating/Feeding: Edge of bed Grooming: Min guard Where Assessed - Grooming: Supported standing Upper Body Bathing: Supervision/safety;Set up Where Assessed - Upper Body Bathing: Unsupported sitting Lower Body Bathing: Minimal assistance Where Assessed - Lower Body Bathing: Supported sit to stand Where Assessed - Upper Body Dressing: Unsupported sitting Lower Body Dressing: Minimal assistance Where Assessed - Lower Body Dressing: Supported sit to stand Toilet Transfer: Minimal assistance Toilet Transfer Method: Sit to Loss adjuster, chartered: Regular height toilet;Grab bars Equipment Used: Gait belt;Rolling walker Transfers/Ambulation Related to ADLs: Pt required use of RW and widen base of support. pt with knee flexion during static standing .  ADL Comments: further assessed vision. Pt states that he feels off balance at times. Diplopia appears worse with near L gaze. On assessment. noted that R eye does not adduct during convergence. However, able to adduct during saccades.    OT Diagnosis:    OT Problem List:   OT Treatment Interventions:     OT Goals(current goals can now be found in the care plan section) Acute Rehab OT Goals Patient Stated Goal: to return home and not go on cruise with wife 04/01/13 OT Goal Formulation: With patient/family Time For Goal Achievement: 04/01/13 Potential to Achieve Goals: Good ADL Goals Pt Will Perform Grooming: with min guard assist;standing Pt Will Perform Upper Body Bathing: with min guard assist;standing Pt Will Perform Lower Body Bathing: with min guard  assist;sit to/from stand Pt Will Perform Upper Body Dressing: with min guard assist;standing Pt Will Perform Lower Body Dressing:  with min guard assist;sit to/from stand Pt Will Transfer to Toilet: with min guard assist;ambulating;regular height toilet Additional ADL Goal #1: pt will complete transfer without DME min guard (A) Additional ADL Goal #2: Pt will static stand ~3 minutes without UE support or LOB  Visit Information  Last OT Received On: 03/19/13 Assistance Needed: +1 History of Present Illness: 68 yo male admitted with gait deficits and visual changes . MRI (+) remote Rt midbrain infarct    Subjective Data      Prior Functioning       Cognition  Cognition Arousal/Alertness: Awake/alert Behavior During Therapy: WFL for tasks assessed/performed Overall Cognitive Status: Impaired/Different from baseline    Mobility  Bed Mobility Overal bed mobility: Needs Assistance Bed Mobility: Supine to Sit Supine to sit: Supervision Transfers Overall transfer level: Needs assistance Sit to Stand: Min assist    Exercises      Balance Balance Sitting balance-Leahy Scale: Fair  End of Session OT - End of Session Equipment Utilized During Treatment: Gait belt;Rolling walker Activity Tolerance: Patient tolerated treatment well Patient left: in bed;with call bell/phone within reach;with bed alarm set;with family/visitor present Nurse Communication: Mobility status;Precautions  GO     Malcolm Quast,HILLARY 03/19/2013, 5:05 PM [facility], OTR/L  [phone] 03/19/2013

## 2013-03-20 ENCOUNTER — Encounter (HOSPITAL_COMMUNITY): Payer: Self-pay | Admitting: Internal Medicine

## 2013-03-20 DIAGNOSIS — F319 Bipolar disorder, unspecified: Secondary | ICD-10-CM

## 2013-03-20 DIAGNOSIS — E785 Hyperlipidemia, unspecified: Secondary | ICD-10-CM | POA: Diagnosis present

## 2013-03-20 LAB — BASIC METABOLIC PANEL
BUN: 11 mg/dL (ref 6–23)
CHLORIDE: 99 meq/L (ref 96–112)
CO2: 29 mEq/L (ref 19–32)
Calcium: 9.4 mg/dL (ref 8.4–10.5)
Creatinine, Ser: 1.03 mg/dL (ref 0.50–1.35)
GFR calc Af Amer: 84 mL/min — ABNORMAL LOW (ref >90)
GFR calc non Af Amer: 72 mL/min — ABNORMAL LOW (ref >90)
GLUCOSE: 97 mg/dL (ref 70–99)
POTASSIUM: 4.9 meq/L (ref 3.7–5.3)
SODIUM: 138 meq/L (ref 137–147)

## 2013-03-20 LAB — CBC
HEMATOCRIT: 38.1 % — AB (ref 39.0–52.0)
HEMOGLOBIN: 13.1 g/dL (ref 13.0–17.0)
MCH: 32.1 pg (ref 26.0–34.0)
MCHC: 34.4 g/dL (ref 30.0–36.0)
MCV: 93.4 fL (ref 78.0–100.0)
Platelets: 184 10*3/uL (ref 150–400)
RBC: 4.08 MIL/uL — ABNORMAL LOW (ref 4.22–5.81)
RDW: 12.7 % (ref 11.5–15.5)
WBC: 6 10*3/uL (ref 4.0–10.5)

## 2013-03-20 LAB — PROTIME-INR
INR: 2.99 — ABNORMAL HIGH (ref 0.00–1.49)
Prothrombin Time: 30 seconds — ABNORMAL HIGH (ref 11.6–15.2)

## 2013-03-20 MED ORDER — WARFARIN SODIUM 4 MG PO TABS
4.0000 mg | ORAL_TABLET | Freq: Once | ORAL | Status: AC
  Administered 2013-03-20: 4 mg via ORAL
  Filled 2013-03-20: qty 1

## 2013-03-20 NOTE — ED Provider Notes (Signed)
Medical screening examination/treatment/procedure(s) were conducted as a shared visit with non-physician practitioner(s) and myself.  I personally evaluated the patient during the encounter.  EKG Interpretation    Date/Time:  Thursday March 17 2013 18:40:28 EST Ventricular Rate:  83 PR Interval:    QRS Duration: 108 QT Interval:  413 QTC Calculation: 485 R Axis:   79 Text Interpretation:  Atrial fibrillation Minimal ST depression, diffuse leads Borderline prolonged QT interval ED PHYSICIAN INTERPRETATION AVAILABLE IN [facility] Confirmed by TEST, RECORD ([id number]) on 03/19/2013 1:20:23 PM              Mervin Kung, MD 03/20/13 1028

## 2013-03-20 NOTE — Progress Notes (Signed)
[facility] PROGRESS NOTE  [name] [name] ELF:[id number] DOB: 1945/12/25 DOA: 03/17/2013 PCP: Horatio Pel, MD  Assessment/Plan: #1 ataxia/slurred speech Questionable etiology. Concern for posterior circulatory stroke. Patient with clinical improvement. MRI of the head is negative. MRA is pending. 2-D echo is with no source of emboli. Carotid Dopplers with no significant ICA stenosis. INR is trending back down at 2.99 from at 5.69 from 3.74. Patient will likely need to follow up with neurology as outpatient. Neurology is following and appreciate input and recommendations.  #2 atrial fibrillation Currently rate controlled. INR is trending back down and at 2.99 today from 5.69 from 3.74. Follow INR.  #3 bipolar disorder Stable. Continue Lamictal and Depakote.  #4 hypertension Stable. Continue lisinopril.  #5 hyperlipidemia Continue Lipitor.  Code Status: full Family Communication: updated patient no family at bedside. Disposition Plan: CIR versus skilled nursing facility when medically stable.   Consultants:  Neurology: Dr. Aram Beecham 03/18/2013  Procedures:  MRI of the head 03/17/2013  2-D echo 03/18/2013  Carotid Dopplers 03/18/2013  Antibiotics:  none  HPI/Subjective: Patient feeling better.  Objective: Filed Vitals:   03/20/13 1308  BP: 129/61  Pulse: 78  Temp: 98.5 F (36.9 C)  Resp: 18   No intake or output data in the 24 hours ending 03/20/13 1416 Filed Weights   03/18/13 0001  Weight: 87.9 kg (193 lb 12.6 oz)    Exam:   General:  NAD  Cardiovascular: RRR  Respiratory: CTAB  Abdomen: soft, nontender, nondistended, positive bowel sounds.  Musculoskeletal: no clubbing cyanosis or edema.  Data Reviewed: Basic Metabolic Panel:  Recent Labs Lab 03/17/13 1900 03/17/13 1933 03/18/13 0455 03/19/13 0439 03/20/13 0753  NA 136* 136* 136* 141 138  K 4.2 4.0 4.2 5.2 4.9  CL 95* 96 98 102 99  CO2 25  --  25 30 29   GLUCOSE 105*  110* 100* 90 97  BUN 8 7 11 17 11   CREATININE 0.76 0.90 0.91 1.13 1.03  CALCIUM 10.4  --  9.5 9.6 9.4   Liver Function Tests:  Recent Labs Lab 03/17/13 1900  AST 21  ALT <5  ALKPHOS 103  BILITOT 0.6  PROT 8.0  ALBUMIN 4.6   No results found for this basename: LIPASE, AMYLASE,  in the last 168 hours No results found for this basename: AMMONIA,  in the last 168 hours CBC:  Recent Labs Lab 03/17/13 1900 03/17/13 1933 03/18/13 0455 03/20/13 0753  WBC 4.6  --  6.4 6.0  NEUTROABS 2.6  --   --   --   HGB 15.3 16.7 13.0 13.1  HCT 44.4 49.0 37.8* 38.1*  MCV 93.7  --  91.5 93.4  PLT 193  --  186 184   Cardiac Enzymes:  Recent Labs Lab 03/17/13 1900  TROPONINI <0.30   BNP (last 3 results) No results found for this basename: PROBNP,  in the last 8760 hours CBG: No results found for this basename: GLUCAP,  in the last 168 hours  No results found for this or any previous visit (from the past 240 hour(s)).   Studies: Mr Virgel Paling Wo Contrast  03/18/2013   CLINICAL DATA:  TIA.  Unsteady gait, ataxia, slurred speech.  EXAM: MRA HEAD WITHOUT CONTRAST  TECHNIQUE: Angiographic images of the Circle of Willis were obtained using MRA technique without intravenous contrast.  COMPARISON:  None.  FINDINGS: The visualized distal vertebral arteries are patent. The right vertebral artery is dominant, with the left being markedly hypoplastic distal to the PICA  origin. PICA origins are patent bilaterally. The basilar artery is patent without stenosis. SCAs are patent. The P1 segments of the PCAs are hypoplastic bilaterally with prominent bilateral posterior communicating arteries present. The PCAs are otherwise unremarkable.  The internal carotid arteries are patent from skullbase to carotid terminus. ACA and MCA origins and visualized branches are patent and unremarkable. A small anterior communicating artery is present. No intracranial aneurysm is identified.  IMPRESSION: Unremarkable head MRA.  No evidence of major intracranial arterial occlusion or flow limiting stenosis.   Electronically Signed   By: Logan Bores   On: 03/18/2013 14:55    Scheduled Meds: . atorvastatin  40 mg Oral Daily  . carbidopa-levodopa  1 tablet Oral BID  . divalproex  250 mg Oral BID  . lamoTRIgine  150 mg Oral BID  . lisinopril  10 mg Oral Daily  . omega-3 acid ethyl esters  1 g Oral Daily  . pneumococcal 23 valent vaccine  0.5 mL Intramuscular Tomorrow-1000  . sodium chloride  3 mL Intravenous Q12H  . warfarin  4 mg Oral ONCE-1800  . Warfarin - Pharmacist Dosing Inpatient   Does not apply q1800   Continuous Infusions:   Principal Problem:   Gait instability Active Problems:   Atrial fibrillation   Unspecified essential hypertension   Brain TIA   Bipolar disorder, unspecified   Other and unspecified hyperlipidemia    Time spent: 35 minutes    [facility] MD [facility] Pager [phone]. If 7PM-7AM, please contact night-coverage at [email or web address], password [facility] 03/20/2013, 2:16 PM  LOS: 3 days

## 2013-03-20 NOTE — Progress Notes (Signed)
ANTICOAGULATION CONSULT NOTE - Follow Up Consult  Pharmacy Consult:  Coumadin Indication: atrial fibrillation  No Known Allergies  Patient Measurements: Height: 5\' 11"  (180.3 cm) Weight: 193 lb 12.6 oz (87.9 kg) IBW/kg (Calculated) : 75.3  Vital Signs: Temp: 97.9 F (36.6 C) (01/18 0548) Temp src: Oral (01/18 0548) BP: 154/76 mmHg (01/18 0548) Pulse Rate: 74 (01/18 0548)  Labs:  Recent Labs  03/17/13 1900 03/17/13 1933 03/18/13 0455 03/19/13 0439 03/20/13 0640 03/20/13 0753  HGB 15.3 16.7 13.0  --   --  13.1  HCT 44.4 49.0 37.8*  --   --  38.1*  PLT 193  --  186  --   --  184  APTT 38*  --   --   --   --   --   LABPROT 25.4*  --  35.6* 48.7* 30.0*  --   INR 2.40*  --  3.74* 5.69* 2.99*  --   CREATININE 0.76 0.90 0.91 1.13  --  1.03  TROPONINI <0.30  --   --   --   --   --     Estimated Creatinine Clearance: 72.1 ml/min (by C-G formula based on Cr of 1.03).   Assessment: 19 YOM on Coumadin for history of Afib.  INR 2.99, decreased after holding dose Goal of Therapy:  INR 2-3 Monitor platelets by anticoagulation protocol: Yes    Plan:  - Coumadin 4 mg today - Daily PT / INR  Elmer Picker Clinical Pharmacist Pager [phone]  03/20/2013 10:49 AM

## 2013-03-20 NOTE — Progress Notes (Signed)
Utilization review completed.  

## 2013-03-20 NOTE — Progress Notes (Signed)
Patient chooses bed at [facility]. Patient's son Harmon Bommarito is completing paper work with [employer] at [employer]  at 10 am. Patient can transfer to [facility] if he is medically stable.   Blima Rich, [place] Week[employer] CSW [phone]

## 2013-03-21 DIAGNOSIS — R269 Unspecified abnormalities of gait and mobility: Secondary | ICD-10-CM

## 2013-03-21 LAB — PROTIME-INR
INR: 1.98 — ABNORMAL HIGH (ref 0.00–1.49)
PROTHROMBIN TIME: 21.9 s — AB (ref 11.6–15.2)

## 2013-03-21 MED ORDER — WARFARIN SODIUM 5 MG PO TABS
5.0000 mg | ORAL_TABLET | Freq: Once | ORAL | Status: AC
  Administered 2013-03-21: 5 mg via ORAL
  Filled 2013-03-21: qty 1

## 2013-03-21 NOTE — Progress Notes (Signed)
Physical Therapy Note  Late entry for missed g-codes.     04/03/2013 1005  PT Visit Information  Last PT Received On 04/06/13  PT G-Codes **NOT FOR INPATIENT CLASS**  Functional Assessment Tool Used Clinical Judgement  Functional Limitation Mobility: Walking and moving around  Mobility: Walking and Moving Around Current Status [phone]) CK  Mobility: Walking and Moving Around Goal Status [phone]) CI   Sander Radon, PT [phone]

## 2013-03-21 NOTE — Progress Notes (Signed)
Discharge orders received, pt for discharge to [facility] today, IV and telemtry D/C,  D/C instructions given with verbalized understanding.  Family at bedside to assist pt with discharge. Staff brought pt downstairs via wheelchair.

## 2013-03-21 NOTE — Discharge Summary (Signed)
Physician Discharge Summary  [place] VWU:[id number] DOB: [birth year] DOA: 03/17/2013  PCP: Horatio Pel, MD  Admit date: 03/17/2013 Discharge date: 03/21/2013  Time spent: 70 minutes  Recommendations for Outpatient Follow-up:  1. Followup with Horatio Pel, MD in 1 week. On followup patient's anticoagulation will need to. Says that he had presented with gait instability and would defer to PCP as to whether patient will need to remain on Coumadin. 2. Followup with neurology in one to 2 weeks for further evaluation of his gait instability. 3. Patient will need a PT/INR checked tomorrow 03/22/2013.  Discharge Diagnoses:  Principal Problem:   Gait instability Active Problems:   Atrial fibrillation   Unspecified essential hypertension   Brain TIA   Bipolar disorder, unspecified   Other and unspecified hyperlipidemia   Discharge Condition: Stable and improved  Diet recommendation: Heart healthy  Filed Weights   03/18/13 0001  Weight: 87.9 kg (193 lb 12.6 oz)    History of present illness:  68 year old male PMH of stroke, atrial fibrillation, HTN, and HLD who presents to the ED with slurred speech and visual disturbance. The patient went to bed around 3 AM on the day of admission. He woke up between 12 PM and 1 PM and noted some difficulty walking. He also had some blurry vision with diplopia. Patient states that he has had problems with blurred vision diplopia intermittently since January 2013. He states that this occurs anywhere from once per week to a few times per month. He has had multiple workups for stroke and was most recently admitted to [facility] in Summer 2014 during which stroke workup was neg. He sees Dr. Jannifer Franklin for neurologic care. The patient felt like he had loss of balance this afternoon when he woke up. In addition, his wife noted some slurred speech which seems to be improved currently. However, he denies any overt dizziness, headache, syncope. In  addition, the patient had this loss of balance 2 weeks ago and fell onto his left buttocks. The patient endorses compliance with his medications, but has forgotten to refill his lisinopril for over one month.  the patient denies any fevers, chills, chest discomfort, shortness breath, nausea, vomiting, diarrhea, abdominal pain, rashes.  In the ED, BMP and CBC were unremarkable. Hepatic panel was unremarkable. INR was 2.40. CT brain was negative. Left hip x-ray was negative for fracture or dislocation. Incidentally, the patient had a nuclear medicine stress test on 08/31/2012 which was interpreted as low risk. MRI of the brain is pending.   Hospital Course:  #1 ataxia/slurred speech  Patient was admitted with ataxic gait and slurred speech. Due to concern for posterior circulatory stroke,  Patient was admitted to the telemetry floor her stroke on the were taken. Neurology was also consulted and followed the patient throughout the hospitalization. MRI of the head and was negative for acute infarction. 2-D echo with no source of emboli. Carotid Dopplers with no significant ICA stenosis.  Patient improved clinically he was followed by physical therapy and be discharged to a skilled nursing facility in stable and improved condition. Patient will need to followup with neurology as outpatient.  #2 atrial fibrillation   patient's heart rate remained controlled during the hospitalization. Patient's INR was followed and went up as high as 5.69. Patient's Coumadin was held and his INR trended back down. Patient remained asymptomatic over discharged home on Coumadin he'll need to followup with PCP as outpatient. Patient will need INR check on 03/22/2013.  #3 bipolar disorder   remained  stable throughout the hospitalization. Patient was continued on Lamictal and Depakote.  #4 hypertension  Stable. Continued on lisinopril.  #5 hyperlipidemia  Continued on Lipitor.   Procedures: MRI of the head 03/17/2013  2-D  echo 03/18/2013  Carotid Dopplers 03/18/2013     Consultations: Neurology: Dr. Cyril Mourning 03/18/2013   Discharge Exam: Filed Vitals:   03/21/13 1001  BP: 143/61  Pulse: 71  Temp: 97.4 F (36.3 C)  Resp: 18    General: NAD Cardiovascular: RRR Respiratory: CTAB  Discharge Instructions      Discharge Orders   Future Orders Complete By Expires   Diet - low sodium heart healthy  As directed    Discharge instructions  As directed    Comments:     Follow up with neurology in 1-2 weeks. Follow up with Londell Moh, MD in 1 week. Get PT/INR CHECKED TOMORROW 03/22/13.   Increase activity slowly  As directed        Medication List         atorvastatin 40 MG tablet  Commonly known as:  LIPITOR  Take 40 mg by mouth daily.     carbidopa-levodopa 25-250 MG per tablet  Commonly known as:  SINEMET IR  Take 1 tablet by mouth 2 (two) times daily.     divalproex 250 MG DR tablet  Commonly known as:  DEPAKOTE  Take 250 mg by mouth 2 (two) times daily.     fish oil-omega-3 fatty acids 1000 MG capsule  Take 1 g by mouth daily.     folic acid 1 MG tablet  Commonly known as:  FOLVITE  Take 1 mg by mouth daily.     lamoTRIgine 150 MG tablet  Commonly known as:  LAMICTAL  Take 150 mg by mouth 2 (two) times daily.     lisinopril 10 MG tablet  Commonly known as:  PRINIVIL,ZESTRIL  Take 10 mg by mouth daily.     warfarin 5 MG tablet  Commonly known as:  COUMADIN  Take 5-7.5 mg by mouth daily. Take 7.5 mg on Tuesday, Thursday, and Saturday. All other days take 5 mg       No Known Allergies Follow-up Information   Follow up with Londell Moh, MD. Schedule an appointment as soon as possible for a visit in 1 week.   Specialty:  Internal Medicine   Contact information:   [address] [place] [address] [phone]       Follow up with [facility] [place]. Schedule an appointment as soon as possible for a visit in 2 weeks.    Specialty:  Neurology   Contact information:   [address] [place], [address] [place] [address] [phone]      Follow up On 03/22/2013. (NEEDS pt/inr CHECKED TOMMORROW)        The results of significant diagnostics from this hospitalization (including imaging, microbiology, ancillary and laboratory) are listed below for reference.    Significant Diagnostic Studies: Dg Hip Complete Left  03/17/2013   CLINICAL DATA:  Fall.  Left hip pain.  EXAM: LEFT HIP - COMPLETE 2+ VIEW  COMPARISON:  None.  FINDINGS: There is no evidence of hip fracture or dislocation. Mild left hip osteoarthritis noted.  No pelvic fracture identified. Severe right hip osteoarthritis demonstrated.  IMPRESSION: No acute findings.  Mild left and severe right hip osteoarthritis.   Electronically Signed   By: Myles Rosenthal M.D.   On: 03/17/2013 20:36   Ct Head Wo Contrast  03/17/2013  CLINICAL DATA:  Slurred speech.  Difficulty ambulating.  EXAM: CT HEAD WITHOUT CONTRAST  TECHNIQUE: Contiguous axial images were obtained from the base of the skull through the vertex without intravenous contrast.  COMPARISON:  07/09/2012  FINDINGS: Sinuses/Soft tissues: Mucous retention cyst or polyp in the left maxillary sinus. Clear mastoid air cells.  Intracranial: Mild cerebral atrophy. No mass lesion, hemorrhage, hydrocephalus, acute infarct, intra-axial, or extra-axial fluid collection.  IMPRESSION: 1.  No acute intracranial abnormality. 2. Sinus disease.   Electronically Signed   By: Abigail Miyamoto M.D.   On: 03/17/2013 19:35   Mr Jodene Nam Head Wo Contrast  03/18/2013   CLINICAL DATA:  TIA.  Unsteady gait, ataxia, slurred speech.  EXAM: MRA HEAD WITHOUT CONTRAST  TECHNIQUE: Angiographic images of the Circle of Willis were obtained using MRA technique without intravenous contrast.  COMPARISON:  None.  FINDINGS: The visualized distal vertebral arteries are patent. The right vertebral artery is dominant, with the left being markedly  hypoplastic distal to the PICA origin. PICA origins are patent bilaterally. The basilar artery is patent without stenosis. SCAs are patent. The P1 segments of the PCAs are hypoplastic bilaterally with prominent bilateral posterior communicating arteries present. The PCAs are otherwise unremarkable.  The internal carotid arteries are patent from skullbase to carotid terminus. ACA and MCA origins and visualized branches are patent and unremarkable. A small anterior communicating artery is present. No intracranial aneurysm is identified.  IMPRESSION: Unremarkable head MRA. No evidence of major intracranial arterial occlusion or flow limiting stenosis.   Electronically Signed   By: Logan Bores   On: 03/18/2013 14:55   Mr Brain Wo Contrast  03/18/2013   CLINICAL DATA:  Unsteady gait, ataxia, slurred speech  EXAM: MRI HEAD WITHOUT CONTRAST  TECHNIQUE: Multiplanar, multiecho pulse sequences of the brain and surrounding structures were obtained without intravenous contrast.  COMPARISON:  Prior CT from earlier the same day as well as prior MRI from 08/27/2011  FINDINGS: Mild diffuse prominence of the CSF containing spaces is compatible with generalized age-related atrophy. Mild scattered and confluent T2/FLAIR hyperintensity within the periventricular and deep white matter is present, nonspecific, but likely related to chronic microvascular ischemic disease.  No mass lesion or midline shift. No hydrocephalus. No extra-axial fluid collection.  No diffusion-weighted signal abnormality is identified to suggest acute intracranial infarct. Normal flow voids seen within the intracranial vasculature. Gray-white matter differentiation is maintained. No acute intracranial hemorrhage. Cystic encephalomalacia with wallerian degeneration seen within the right midbrain, compatible with remote right mid brain infarct, unchanged.  Craniocervical junction is within normal limits. Pituitary gland is normal. Globes and optic nerves  demonstrate a normal appearance.  Calvarium demonstrates a normal appearance with normal signal intensity.  Probable retention cyst noted within the left maxillary sinus, incompletely visualized. Minimal opacity present within the right sphenoid sinus. Paranasal sinuses are otherwise clear. No mastoid effusion.  IMPRESSION: 1. No acute intracranial infarct or other abnormality. 2. Mild age-related atrophy with chronic microvascular ischemic disease. 3. Remote right mid brain infarct, unchanged.   Electronically Signed   By: Jeannine Boga M.D.   On: 03/18/2013 00:10    Microbiology: No results found for this or any previous visit (from the past 240 hour(s)).   Labs: Basic Metabolic Panel:  Recent Labs Lab 03/17/13 1900 03/17/13 1933 03/18/13 0455 03/19/13 0439 03/20/13 0753  NA 136* 136* 136* 141 138  K 4.2 4.0 4.2 5.2 4.9  CL 95* 96 98 102 99  CO2 25  --  25  30 29  GLUCOSE 105* 110* 100* 90 97  BUN 8 7 11 17 11   CREATININE 0.76 0.90 0.91 1.13 1.03  CALCIUM 10.4  --  9.5 9.6 9.4   Liver Function Tests:  Recent Labs Lab 03/17/13 1900  AST 21  ALT <5  ALKPHOS 103  BILITOT 0.6  PROT 8.0  ALBUMIN 4.6   No results found for this basename: LIPASE, AMYLASE,  in the last 168 hours No results found for this basename: AMMONIA,  in the last 168 hours CBC:  Recent Labs Lab 03/17/13 1900 03/17/13 1933 03/18/13 0455 03/20/13 0753  WBC 4.6  --  6.4 6.0  NEUTROABS 2.6  --   --   --   HGB 15.3 16.7 13.0 13.1  HCT 44.4 49.0 37.8* 38.1*  MCV 93.7  --  91.5 93.4  PLT 193  --  186 184   Cardiac Enzymes:  Recent Labs Lab 03/17/13 1900  TROPONINI <0.30   BNP: BNP (last 3 results) No results found for this basename: PROBNP,  in the last 8760 hours CBG: No results found for this basename: GLUCAP,  in the last 168 hours     Signed:  [facility] MD [facility] 03/21/2013, 2:19 PM

## 2013-03-21 NOTE — Progress Notes (Signed)
Physical Therapy Treatment Patient Details Name: [name] [name] MRN: [id number] DOB: 1945/06/19 Today's Date: 03/21/2013 Time: 2706-2376 PT Time Calculation (min): 17 min  PT Assessment / Plan / Recommendation  History of Present Illness 68 yo male admitted with gait deficits and visual changes . MRI (+) remote Rt midbrain infarct   PT Comments   Pt found up in room attempting to pack his cpap with cord on floor at pt's feet.  Pt ed on need to call for A for OOB.  Pt initially stated someone was with him, but they left, then states that actually no one was with him, but they told him he could get his things packed, then states that he actually got up on his own and didn't call for A.  Pt with poor safety awareness and memory deficits.  Made RN aware that pt was up on his own and that PT placed bed alarm on before leaving room.  Pt has demo'd improved mobility, though is not safe to be alone and would benefit from further therapies.    Follow Up Recommendations  SNF     Does the patient have the potential to tolerate intense rehabilitation     Barriers to Discharge        Equipment Recommendations  Rolling walker with 5" wheels    Recommendations for Other Services    Frequency Min 4X/week   Progress towards PT Goals Progress towards PT goals: Progressing toward goals  Plan Discharge plan needs to be updated    Precautions / Restrictions Precautions Precautions: Fall Precaution Comments: occ diplopia Restrictions Weight Bearing Restrictions: No   Pertinent Vitals/Pain Denied pain.      Mobility  Transfers Overall transfer level: Needs assistance Equipment used: Rolling walker (2 wheeled) Transfers: Sit to/from Stand Sit to Stand: Min guard General transfer comment: cues to get closer to bed prior to sitting and to use UEs.   Ambulation/Gait Ambulation/Gait assistance: Min guard Ambulation Distance (Feet): 150 Feet Assistive device: Rolling walker (2 wheeled) Gait  Pattern/deviations: Step-through pattern;Decreased stride length;Shuffle;Staggering left Gait velocity: Moves quickly.  Needs cueing to slow down.   General Gait Details: pt continues with ataxic gait, though better stability with use of RW.  pt running into objects on L side x4 and with poor awareness of deficits.   Modified Rankin (Stroke Patients Only) Pre-Morbid Rankin Score: No symptoms Modified Rankin: Moderately severe disability    Exercises     PT Diagnosis:    PT Problem List:   PT Treatment Interventions:     PT Goals (current goals can now be found in the care plan section) Acute Rehab PT Goals Patient Stated Goal: to return home and not go on cruise with wife 04/01/13 Time For Goal Achievement: 04/01/13 Potential to Achieve Goals: Good  Visit Information  Last PT Received On: 03/21/13 Assistance Needed: +1 History of Present Illness: 68 yo male admitted with gait deficits and visual changes . MRI (+) remote Rt midbrain infarct    Subjective Data  Patient Stated Goal: to return home and not go on cruise with wife 04/01/13   Cognition  Cognition Arousal/Alertness: Awake/alert Behavior During Therapy: WFL for tasks assessed/performed Overall Cognitive Status: Impaired/Different from baseline Area of Impairment: Orientation;Attention;Memory;Following commands;Safety/judgement;Awareness;Problem solving Orientation Level: Disoriented to;Time Current Attention Level: Selective Memory: Decreased short-term memory Following Commands: Follows multi-step commands with increased time Safety/Judgement: Decreased awareness of safety;Decreased awareness of deficits Awareness: Emergent;Anticipatory Problem Solving: Slow processing;Difficulty sequencing;Requires verbal cues;Requires tactile cues    Balance  Balance Standing balance support: During functional activity;Single extremity supported Standing balance-Leahy Scale: Poor Standing balance comment: pt requires use of UE to  provide stability.    End of Session PT - End of Session Equipment Utilized During Treatment: Gait belt Activity Tolerance: Patient tolerated treatment well Patient left: in bed;with call bell/phone within reach;with bed alarm set Nurse Communication: Mobility status   GP     Catarina Hartshorn, [place] 03/21/2013, 10:34 AM

## 2013-03-21 NOTE — Progress Notes (Signed)
CSW received a message from [facility] has completed paperwork at facility and they are able to accept Pt when d/c summary and AVS are ready.   CSW will notify MD.    [name] [name] [place] 1-16;  6N1-16 Phone: [phone]

## 2013-03-21 NOTE — Progress Notes (Signed)
Rt Note: Pt has home CPAP and places himself on per home use

## 2013-03-21 NOTE — Consult Note (Signed)
Physical Medicine and Rehabilitation Consult  Reason for Consult: Dysarthria, double vision, problems walking. Referring Physician: Dr. Grandville Silos   HPI: [name] [name] is a 68 y.o. male with history of HTN, A fib--chronic coumadin, bipolar disorder, CVA '91; who was admitted on 03/18/13 with acute onset of transient unsteady gait and dysarthria. INR therapeutic at admission. MRI brain without acute changes. MRA without flow reducing stenosis or occlusion. 2D echo with EF 50-55% and no wall abnormality. Carotid dopplers with 1-39% ICA stenosis.  Neurology feels that patient with inconsistent history and unclear etiology of symptoms. OT evaluation initially revealed diplopia--has resolved.  Noted to have gait disorder and MD/OT recommending CIR.   Patient reports problems with double vision started three years ago. Reports has been to [facility] X 2 for similar symptoms and has had negative workup.   Review of Systems  HENT: Negative for hearing loss.   Eyes: Positive for double vision (past few years).  Respiratory: Negative for cough and shortness of breath.   Cardiovascular: Negative for chest pain and palpitations.  Gastrointestinal: Negative for heartburn and nausea.  Genitourinary: Positive for urgency. Negative for frequency.  Neurological: Negative for headaches.   Past Medical History  Diagnosis Date  . HLD (hyperlipidemia)   . HTN (hypertension)   . Stroke 1991  . Atrial fibrillation   . Other and unspecified hyperlipidemia 03/20/2013   Past Surgical History  Procedure Laterality Date  . Tonsillectomy and adenoidectomy     Family History  Problem Relation Age of Onset  . Suicidality    . Cancer    . Alcohol abuse     Social History:  Married. Retired--worked multiple jobs.Retired at age 69 and reports still active. He  reports that he has never smoked. He does not have any smokeless tobacco history on file. He reports that he drinks about mixed drink every night. His drug history  is not on file.  Allergies: No Known Allergies  Medications Prior to Admission  Medication Sig Dispense Refill  . atorvastatin (LIPITOR) 40 MG tablet Take 40 mg by mouth daily.      . carbidopa-levodopa (SINEMET IR) 25-250 MG per tablet Take 1 tablet by mouth 2 (two) times daily.      . divalproex (DEPAKOTE) 250 MG DR tablet Take 250 mg by mouth 2 (two) times daily.      . fish oil-omega-3 fatty acids 1000 MG capsule Take 1 g by mouth daily.       . folic acid (FOLVITE) 1 MG tablet Take 1 mg by mouth daily.      Marland Kitchen lamoTRIgine (LAMICTAL) 150 MG tablet Take 150 mg by mouth 2 (two) times daily.      Marland Kitchen warfarin (COUMADIN) 5 MG tablet Take 5-7.5 mg by mouth daily. Take 7.5 mg on Tuesday, Thursday, and Saturday. All other days take 5 mg      . lisinopril (PRINIVIL,ZESTRIL) 10 MG tablet Take 10 mg by mouth daily.        Home: Home Living Family/patient expects to be discharged to:: Inpatient rehab Living Arrangements: Spouse/significant other;Children  Functional History:   Functional Status:  Mobility:     Ambulation/Gait Ambulation Distance (Feet): 30 Feet Gait velocity: Moves quickly.  Needs cueing to slow down.   General Gait Details: pt very ataxic with Bil LEs and poor balance.  pt with no awareness of deficits until he was attempting to ambulate and still did not realize extent of A PT was providing.  pt also with visual deficits affecting  mobility.      ADL: ADL Eating/Feeding: Modified independent Where Assessed - Eating/Feeding: Edge of bed Grooming: Min guard Where Assessed - Grooming: Supported standing Upper Body Bathing: Supervision/safety;Set up Where Assessed - Upper Body Bathing: Unsupported sitting Lower Body Bathing: Minimal assistance Where Assessed - Lower Body Bathing: Supported sit to stand Where Assessed - Upper Body Dressing: Unsupported sitting Lower Body Dressing: Minimal assistance Where Assessed - Lower Body Dressing: Supported sit to stand Toilet  Transfer: Minimal assistance Toilet Transfer Method: Sit to Loss adjuster, chartered: Regular height toilet;Grab bars Equipment Used: Gait belt;Rolling walker Transfers/Ambulation Related to ADLs: Pt required use of RW and widen base of support. pt with knee flexion during static standing .  ADL Comments: further assessed vision. Pt states that he feels off balance at times. Diplopia appears worse with near L gaze. On assessment. noted that R eye does not adduct during convergence. However, able to adduct during saccades.  Cognition: Cognition Overall Cognitive Status: Impaired/Different from baseline Orientation Level: Oriented X4 Cognition Arousal/Alertness: Awake/alert Behavior During Therapy: WFL for tasks assessed/performed Overall Cognitive Status: Impaired/Different from baseline Area of Impairment: Orientation;Attention;Memory;Following commands;Safety/judgement;Awareness;Problem solving Orientation Level: Disoriented to;Time Current Attention Level: Selective Memory: Decreased short-term memory Following Commands: Follows multi-step commands with increased time Safety/Judgement: Decreased awareness of safety;Decreased awareness of deficits Awareness: Emergent;Anticipatory Problem Solving: Slow processing;Difficulty sequencing;Requires verbal cues;Requires tactile cues General Comments: Pt with poor recall of instructions. pt asked to wash hands and attempts to brush teeth for second time during session. Pt stopped by OT and then states "oh I already did that didnt I' pt needs cues to help with awareness to deficits  Blood pressure 135/76, pulse 64, temperature 97.7 F (36.5 C), temperature source Oral, resp. rate 18, height 5\' 11"  (1.803 m), weight 87.9 kg (193 lb 12.6 oz), SpO2 100.00%. Physical Exam  Nursing note and vitals reviewed. Constitutional: He is oriented to person, place, and time. He appears well-developed and well-nourished.  HENT:  Head: Normocephalic and  atraumatic.  Eyes: Conjunctivae are normal. Pupils are equal, round, and reactive to light.  Neck: Normal range of motion. Neck supple.  Cardiovascular: Normal rate and regular rhythm.   Respiratory: Effort normal and breath sounds normal.  GI: Soft. Bowel sounds are normal. He exhibits no distension. There is no tenderness.  Neurological: He is alert and oriented to person, place, and time.  Speech clear. Follow commands without difficulty but distracted and needs redirection. Moves all four without difficulty. Mild left limb ataxia  Skin: Skin is warm and dry.  Psychiatric: He has a normal mood and affect. His behavior is normal. Judgment and thought content normal.    Results for orders placed during the hospital encounter of 03/17/13 (from the past 24 hour(s))  PROTIME-INR     Status: Abnormal   Collection Time    03/21/13  4:06 AM      Result Value Range   Prothrombin Time 21.9 (*) 11.6 - 15.2 seconds   INR 1.98 (*) 0.00 - 1.49   No results found.  Assessment/Plan: Diagnosis: gait disorder, remote CVA 1. Does the need for close, 24 hr/day medical supervision in concert with the patient's rehab needs make it unreasonable for this patient to be served in a less intensive setting? No 2. Co-Morbidities requiring supervision/potential complications: afib 3. Due to bladder management, bowel management, safety, skin/wound care, disease management, medication administration, pain management and patient education, does the patient require 24 hr/day rehab nursing? No 4. Does the patient require coordinated  care of a physician, rehab nurse, PT, OT to address physical and functional deficits in the context of the above medical diagnosis(es)? No Addressing deficits in the following areas: endurance, locomotion, transferring, bowel/bladder control and dressing 5. Can the patient actively participate in an intensive therapy program of at least 3 hrs of therapy per day at least 5 days per week?  Potentially 6. The potential for patient to make measurable gains while on inpatient rehab is fair 7. Anticipated functional outcomes upon discharge from inpatient rehab are n/a with PT, n/a with OT, n/a with SLP. 8. Estimated rehab length of stay to reach the above functional goals is: n/a 9. Does the patient have adequate social supports to accommodate these discharge functional goals? Potentially 10. Anticipated D/C setting: Home 11. Anticipated post D/C treatments: [place] therapy 12. Overall Rehab/Functional Prognosis: good  RECOMMENDATIONS: This patient's condition is appropriate for continued rehabilitative care in the following setting: [facility] Patient has agreed to participate in recommended program. Yes Note that insurance prior authorization may be required for reimbursement for recommended care.  Comment: This patient is close to baseline.  Meredith Staggers, MD, [place] [facility]     03/21/2013

## 2013-03-21 NOTE — Progress Notes (Addendum)
NEURO HOSPITALIST PROGRESS NOTE   SUBJECTIVE:                                                                                                                        Patient feeling much better today.  No further diplopia and was walking hallway with PT.  HE did need some assistance of walker but felt he could do so without. Plan per patient is to go to SNF for further rehab.   OBJECTIVE:                                                                                                                           Vital signs in last 24 hours: Temp:  [97 F (36.1 C)-98.5 F (36.9 C)] 97.4 F (36.3 C) (01/19 1001) Pulse Rate:  [64-78] 71 (01/19 1001) Resp:  [18] 18 (01/19 1001) BP: (107-143)/(61-91) 143/61 mmHg (01/19 1001) SpO2:  [99 %-100 %] 100 % (01/19 1001)  Intake/Output from previous day:   Intake/Output this shift:   Nutritional status: Cardiac  Past Medical History  Diagnosis Date  . HLD (hyperlipidemia)   . HTN (hypertension)   . Stroke 1991  . Atrial fibrillation   . Other and unspecified hyperlipidemia 03/20/2013     Neurologic Exam:  Mental Status: Alert, oriented, thought content appropriate.  Speech fluent without evidence of aphasia.  Able to follow 3 step commands without difficulty. Cranial Nerves: II: Discs flat bilaterally; Visual fields grossly normal, pupils equal, round, reactive to light and accommodation III,IV, VI: ptosis not present, extra-ocular motions intact bilaterally V,VII: smile symmetric, facial light touch sensation normal bilaterally VIII: hearing normal bilaterally IX,X: gag reflex present XI: bilateral shoulder shrug XII: midline tongue extension without atrophy or fasciculations  Motor: Right : Upper extremity   5/5    Left:     Upper extremity   5/5  Lower extremity   5/5     Lower extremity   5/5 Tone and bulk:normal tone throughout; no atrophy noted Sensory: Pinprick and light touch intact  throughout, bilaterally Deep Tendon Reflexes:  Right: Upper Extremity   Left: Upper extremity   biceps (C-5 to C-6) 2/4   biceps (C-5 to C-6) 2/4 tricep (C7) 2/4    triceps (C7)  2/4 Brachioradialis (C6) 2/4  Brachioradialis (C6) 2/4  Lower Extremity Lower Extremity  quadriceps (L-2 to L-4) 2/4   quadriceps (L-2 to L-4) 3/4 Achilles (S1) 2/4   Achilles (S1) 3/4  Plantars: Right: downgoing   Left: upgoing Cerebellar: normal finger-to-nose,  normal heel-to-shin test    Lab Results: Basic Metabolic Panel:  Recent Labs Lab 03/17/13 1900 03/17/13 1933 03/18/13 0455 03/19/13 0439 03/20/13 0753  NA 136* 136* 136* 141 138  K 4.2 4.0 4.2 5.2 4.9  CL 95* 96 98 102 99  CO2 25  --  25 30 29   GLUCOSE 105* 110* 100* 90 97  BUN 8 7 11 17 11   CREATININE 0.76 0.90 0.91 1.13 1.03  CALCIUM 10.4  --  9.5 9.6 9.4    Liver Function Tests:  Recent Labs Lab 03/17/13 1900  AST 21  ALT <5  ALKPHOS 103  BILITOT 0.6  PROT 8.0  ALBUMIN 4.6   No results found for this basename: LIPASE, AMYLASE,  in the last 168 hours No results found for this basename: AMMONIA,  in the last 168 hours  CBC:  Recent Labs Lab 03/17/13 1900 03/17/13 1933 03/18/13 0455 03/20/13 0753  WBC 4.6  --  6.4 6.0  NEUTROABS 2.6  --   --   --   HGB 15.3 16.7 13.0 13.1  HCT 44.4 49.0 37.8* 38.1*  MCV 93.7  --  91.5 93.4  PLT 193  --  186 184    Cardiac Enzymes:  Recent Labs Lab 03/17/13 1900  TROPONINI <0.30    Lipid Panel:  Recent Labs Lab 03/18/13 0453  CHOL 93  TRIG 34  HDL 52  CHOLHDL 1.8  VLDL 7  LDLCALC 34    CBG: No results found for this basename: GLUCAP,  in the last 168 hours  Microbiology: No results found for this or any previous visit.  Coagulation Studies:  Recent Labs  03/19/13 0439 03/20/13 0640 03/21/13 0406  LABPROT 48.7* 30.0* 21.9*  INR 5.69* 2.99* 1.98*    Imaging: No results found.     MEDICATIONS                                                                                                                         Scheduled: . atorvastatin  40 mg Oral Daily  . carbidopa-levodopa  1 tablet Oral BID  . divalproex  250 mg Oral BID  . lamoTRIgine  150 mg Oral BID  . lisinopril  10 mg Oral Daily  . omega-3 acid ethyl esters  1 g Oral Daily  . pneumococcal 23 valent vaccine  0.5 mL Intramuscular Tomorrow-1000  . sodium chloride  3 mL Intravenous Q12H  . Warfarin - Pharmacist Dosing Inpatient   Does not apply q1800    ASSESSMENT/PLAN:  Patietn stating he is back to his baseline today.  Current INR 1.98. MRI/MRA head unremarkable, Echo and carotids unremarkable. Pateitn has been seen by PT today and noted  ambulation/Gait assistance: Min guard, Ambulation Distance (Feet): 150 Feet, Assistive device: Rolling walker (2 wheeled).  He has been accepted to SNF .    Recommendations:  1. Therapy evaluating patient. It seems symptoms have resolved in the past. Will continue to follow in hopes that this will occur this time as well. Patient on Coumadin  2. Patient to follow up with neurology on an outpatient basis.  S/O  Assessment and plan discussed with with attending physician and they are in agreement.    Etta Quill PA-C [facility] [phone]  03/21/2013, 11:34 AM

## 2013-03-21 NOTE — Progress Notes (Signed)
CSW recevied a message from [facility] this a.m. And they are willing to accept the Pt today.   CSW will prepare paperwork for d/c to facility.   CSW will continue to follow Pt for d/c planning.    [facility]  [address];  [phone] Phone: [phone]

## 2013-03-21 NOTE — Progress Notes (Signed)
ANTICOAGULATION CONSULT NOTE - Follow Up Consult  Pharmacy Consult:  Coumadin Indication: atrial fibrillation  No Known Allergies  Patient Measurements: Height: 5\' 11"  (180.3 cm) Weight: 193 lb 12.6 oz (87.9 kg) IBW/kg (Calculated) : 75.3  Vital Signs: Temp: 97.4 F (36.3 C) (01/19 1001) Temp src: Oral (01/19 1001) BP: 143/61 mmHg (01/19 1001) Pulse Rate: 71 (01/19 1001)  Labs:  Recent Labs  03/19/13 0439 03/20/13 0640 03/20/13 0753 03/21/13 0406  HGB  --   --  13.1  --   HCT  --   --  38.1*  --   PLT  --   --  184  --   LABPROT 48.7* 30.0*  --  21.9*  INR 5.69* 2.99*  --  1.98*  CREATININE 1.13  --  1.03  --     Estimated Creatinine Clearance: 72.1 ml/min (by C-G formula based on Cr of 1.03).     Assessment: 41 YOM on Coumadin for history of Afib.  INR now slightly sub-therapeutic post Coumadin held on 1/16 and 1/17 for elevated INR.  No bleeding reported.  Aware patient has a bruise on his left hip down to his upper leg area.   Goal of Therapy:  INR 2-3 Monitor platelets by anticoagulation protocol: Yes    Plan:  - Coumadin 5 mg today - Daily PT / INR    Earnstine Meinders D. Mina Marble, PharmD, BCPS Pager:  [phone] 03/21/2013, 1:11 PM

## 2013-03-23 ENCOUNTER — Encounter: Payer: Self-pay | Admitting: Adult Health

## 2013-03-23 ENCOUNTER — Non-Acute Institutional Stay (SKILLED_NURSING_FACILITY): Payer: Medicare Other | Admitting: Adult Health

## 2013-03-23 DIAGNOSIS — E785 Hyperlipidemia, unspecified: Secondary | ICD-10-CM

## 2013-03-23 DIAGNOSIS — I639 Cerebral infarction, unspecified: Secondary | ICD-10-CM

## 2013-03-23 DIAGNOSIS — F319 Bipolar disorder, unspecified: Secondary | ICD-10-CM

## 2013-03-23 DIAGNOSIS — I6350 Cerebral infarction due to unspecified occlusion or stenosis of unspecified cerebral artery: Secondary | ICD-10-CM

## 2013-03-23 DIAGNOSIS — I10 Essential (primary) hypertension: Secondary | ICD-10-CM

## 2013-03-23 DIAGNOSIS — Z8673 Personal history of transient ischemic attack (TIA), and cerebral infarction without residual deficits: Secondary | ICD-10-CM | POA: Insufficient documentation

## 2013-03-23 DIAGNOSIS — G459 Transient cerebral ischemic attack, unspecified: Secondary | ICD-10-CM

## 2013-03-23 DIAGNOSIS — R2681 Unsteadiness on feet: Secondary | ICD-10-CM

## 2013-03-23 DIAGNOSIS — R269 Unspecified abnormalities of gait and mobility: Secondary | ICD-10-CM

## 2013-03-23 DIAGNOSIS — I4891 Unspecified atrial fibrillation: Secondary | ICD-10-CM

## 2013-03-23 NOTE — Progress Notes (Signed)
Patient ID: [name] [name], male   DOB: November 05, 1945, 68 y.o.   MRN: [id number]     [facility]  No Known Allergies   Chief Complaint  Patient presents with  . Hospitalization Follow-up    HPI:  He has been hospitalized for a tia. He became weak at home and could not walk. He has had this happen to him in the past as well. He is here for short term rehab and will return back home. He is already ambulating independently with a walker in the hallways.    Past Medical History  Diagnosis Date  . HLD (hyperlipidemia)   . HTN (hypertension)   . Stroke 1991  . Atrial fibrillation   . Other and unspecified hyperlipidemia 03/20/2013    Past Surgical History  Procedure Laterality Date  . Tonsillectomy and adenoidectomy      VITAL SIGNS BP 136/88  Pulse 93  Ht 5\' 11"  (1.803 m)  Wt 195 lb (88.451 kg)  BMI 27.21 kg/m2   Patient's Medications  New Prescriptions   No medications on file  Previous Medications   CARBIDOPA-LEVODOPA (SINEMET IR) 25-250 MG PER TABLET    Take 1 tablet by mouth 2 (two) times daily.   DIVALPROEX (DEPAKOTE) 250 MG DR TABLET    Take 250 mg by mouth 2 (two) times daily.   FISH OIL-OMEGA-3 FATTY ACIDS 1000 MG CAPSULE    Take 1 g by mouth daily.    FOLIC ACID (FOLVITE) 1 MG TABLET    Take 1 mg by mouth daily.   LAMOTRIGINE (LAMICTAL) 150 MG TABLET    Take 150 mg by mouth 2 (two) times daily.   LISINOPRIL (PRINIVIL,ZESTRIL) 10 MG TABLET    Take 10 mg by mouth daily.   WARFARIN (COUMADIN) 5 MG TABLET    Take 5-7.5 mg by mouth daily. Take 7.5 mg on Tuesday, Thursday, and Saturday. All other days take 5 mg  Modified Medications   No medications on file  Discontinued Medications   ATORVASTATIN (LIPITOR) 40 MG TABLET    Take 40 mg by mouth daily.    SIGNIFICANT DIAGNOSTIC EXAMS  03-17-13: ct of head: 1.  No acute intracranial abnormality. 2. Sinus disease.  03-17-13: left hip x-ray: No acute findings. Mild left and severe right hip osteoarthritis.   03-17-13:  mri brain: 1. No acute intracranial infarct or other abnormality. 2. Mild age-related atrophy with chronic microvascular ischemic disease. 3. Remote right mid brain infarct, unchanged.   03-18-13: mra of brain: Unremarkable head MRA. No evidence of major intracranial arterial occlusion or flow limiting stenosis.   03-18-13: 2-d echo: Left ventricle: The cavity size was normal. Systolic function was normal. The estimated ejection fraction was in the range of 50% to 55%. Wall motion was normal; there were no regional wall motion abnormalities. - Left atrium: The atrium was mildly dilated. - Right atrium: The atrium was mildly dilated.  03-18-13; carotid doppler: The vertebral arteries appear patent with antegrade flow. - Findings consistent with1- 39 percent stenosis involving the right internal carotid artery and the left internal carotid artery.   LABS REVIEWED:   03-17-13: wbc 4.6; hgb 15.3; hct 44.4; mcv 93.7;plt 193; glucose 105; bun 8; creat 0.76; k+4.2 na++136  depakote <10.0 03-18-13: wbc 6.4; hgb 13.0; hct 37.8; mcv 91.5; plt 186; glucose 100; bun 11; creat 0.91; k+4.2; na++136 chol 93; ldl 34; trig 34; hgb a1c 6.1      Review of Systems  Constitutional: Negative for malaise/fatigue.  Eyes: Negative for  blurred vision.  Respiratory: Negative for cough and shortness of breath.   Cardiovascular: Negative for chest pain, palpitations and leg swelling.  Gastrointestinal: Negative for heartburn, abdominal pain and constipation.  Musculoskeletal: Negative for back pain, joint pain and myalgias.  Skin: Negative.   Neurological: Negative for weakness and headaches.  Psychiatric/Behavioral: Negative for depression. The patient is not nervous/anxious.     Physical Exam  Constitutional: He is oriented to person, place, and time. He appears well-developed and well-nourished. No distress.  Neck: Neck supple. No JVD present.  Cardiovascular: Normal rate, regular rhythm and intact distal pulses.    Respiratory: Effort normal and breath sounds normal. No respiratory distress. He has no wheezes.  GI: Soft. Bowel sounds are normal. He exhibits no distension. There is no tenderness.  Musculoskeletal: Normal range of motion. He exhibits no edema.  Is ambulating with a walker. Was ambulating independently prior to hospitalization.   Neurological: He is alert and oriented to person, place, and time.  Skin: Skin is warm and dry. He is not diaphoretic.  Psychiatric: He has a normal mood and affect.      ASSESSMENT/ PLAN:   1. Old cva with new tia: he is presently stable will continue therapy as directed; will not make changes will continue to monitor his status; he should be able to return back home soon.   2. Hypertension: is stable will continue lisinopril 10 mg daily  3. Afib: will continue his coumadin 7.5 mg daily; he is due for inr on 03-25-13 and will monitor his status  4. Dyslipidemia: will continue his atorvastatin trial medication every 2 weeks; fish oil 1 gm daily  will continue to monitor  5. parkinsonian symptoms: will continue sinemet 25/250 mg twice daily and will monitor  6. Bipolar disorder is emotionally stable will continue depakote 250 mg twice daily and will continue lamictal 150 mg twice daily and will monitor   Time spent with patient 50 minutes.

## 2013-03-24 ENCOUNTER — Non-Acute Institutional Stay (SKILLED_NURSING_FACILITY): Payer: Medicare Other | Admitting: Internal Medicine

## 2013-03-24 DIAGNOSIS — R29818 Other symptoms and signs involving the nervous system: Secondary | ICD-10-CM

## 2013-03-24 DIAGNOSIS — F319 Bipolar disorder, unspecified: Secondary | ICD-10-CM

## 2013-03-24 DIAGNOSIS — R2681 Unsteadiness on feet: Secondary | ICD-10-CM

## 2013-03-24 DIAGNOSIS — R259 Unspecified abnormal involuntary movements: Secondary | ICD-10-CM

## 2013-03-24 DIAGNOSIS — I4891 Unspecified atrial fibrillation: Secondary | ICD-10-CM

## 2013-03-24 DIAGNOSIS — R269 Unspecified abnormalities of gait and mobility: Secondary | ICD-10-CM

## 2013-03-24 DIAGNOSIS — G459 Transient cerebral ischemic attack, unspecified: Secondary | ICD-10-CM

## 2013-03-24 DIAGNOSIS — I10 Essential (primary) hypertension: Secondary | ICD-10-CM

## 2013-03-24 DIAGNOSIS — E785 Hyperlipidemia, unspecified: Secondary | ICD-10-CM

## 2013-03-24 NOTE — Progress Notes (Signed)
Patient ID: [name] [name], male   DOB: 1945-07-15, 68 y.o.   MRN: [id number]    [facility]   PCP: Horatio Pel, MD  Code Status: full code  No Known Allergies  Chief Complaint: new admit  HPI:  68 y/o male pt here for STR after hospital admission with slurred speech and ataxia from 1/15- 03/21/13. Workup was negative for acute infarct. Given his weakness and unsteady gait, he was sent to SNF for STR before returning home. He has hx of afib, htn, hyperlipidemia and stroke. He was seen in his room today. His gait is unsteady. His speech is normal but appears somewhat confused to me. He is in no acute distress. He was in the hospital from 03/17/13- 03/21/13. He denies any complaints today. No concern from staff  Review of Systems:  Constitutional: Negative for fever, chills, weight loss, malaise/fatigue and diaphoresis.  HENT: Negative for congestion, hearing loss and sore throat.   Eyes: Negative for blurred vision, double vision and discharge.  Respiratory: Negative for cough, sputum production, shortness of breath and wheezing.   Cardiovascular: Negative for chest pain, palpitations, orthopnea and leg swelling.  Gastrointestinal: Negative for heartburn, nausea, vomiting, abdominal pain, diarrhea and constipation.  Genitourinary: Negative for dysuria, urgency, frequency and flank pain.  Musculoskeletal: Negative for back pain, falls, joint pain and myalgias.  Skin: Negative for itching and rash.  Neurological: Positive for weakness. Negative for dizziness, tingling, focal weakness and headaches.  Psychiatric/Behavioral: Negative for depression and memory loss. The patient is not nervous/anxious.     Past Medical History  Diagnosis Date  . HLD (hyperlipidemia)   . HTN (hypertension)   . Stroke 1991  . Atrial fibrillation   . Other and unspecified hyperlipidemia 03/20/2013   Past Surgical History  Procedure Laterality Date  . Tonsillectomy and adenoidectomy      Social History:   reports that he has never smoked. He does not have any smokeless tobacco history on file. He reports that he drinks about 0.6 ounces of alcohol per week. His drug history is not on file.  Family History  Problem Relation Age of Onset  . Suicidality    . Cancer    . Alcohol abuse      Medications: Patient's Medications  New Prescriptions   No medications on file  Previous Medications   CARBIDOPA-LEVODOPA (SINEMET IR) 25-250 MG PER TABLET    Take 1 tablet by mouth 2 (two) times daily.   DIVALPROEX (DEPAKOTE) 250 MG DR TABLET    Take 250 mg by mouth 2 (two) times daily.   FISH OIL-OMEGA-3 FATTY ACIDS 1000 MG CAPSULE    Take 1 g by mouth daily.    FOLIC ACID (FOLVITE) 1 MG TABLET    Take 1 mg by mouth daily.   LAMOTRIGINE (LAMICTAL) 150 MG TABLET    Take 150 mg by mouth 2 (two) times daily.   LISINOPRIL (PRINIVIL,ZESTRIL) 10 MG TABLET    Take 10 mg by mouth daily.   WARFARIN (COUMADIN) 5 MG TABLET    Take 5-7.5 mg by mouth daily. Take 7.5 mg on Tuesday, Thursday, and Saturday. All other days take 5 mg  Modified Medications   No medications on file  Discontinued Medications   No medications on file     Physical Exam:  Filed Vitals:   03/24/13 1737  BP: 109/72  Pulse: 82  Temp: 97.6 F (36.4 C)  Resp: 18  SpO2: 96%   Constitutional: He is oriented to person,  place, and time. He appears well-developed and well-nourished. No distress.  Neck: Neck supple. No JVD present.  Cardiovascular: Normal rate, regular rhythm and intact distal pulses.   Respiratory: Effort normal and breath sounds normal. No respiratory distress. He has no wheezes.  GI: Soft. Bowel sounds are normal. He exhibits no distension. There is no tenderness.  Musculoskeletal: Normal range of motion. He exhibits no edema. Is ambulating with a walker. Was ambulating independently prior to hospitalization.   Neurological: He is alert and oriented to person, place, and time.  Skin: Skin is warm  and dry. He is not diaphoretic.  Psychiatric: He has a normal mood and affect.    Labs reviewed: Basic Metabolic Panel:  Recent Labs  03/18/13 0455 03/19/13 0439 03/20/13 0753  NA 136* 141 138  K 4.2 5.2 4.9  CL 98 102 99  CO2 25 30 29   GLUCOSE 100* 90 97  BUN 11 17 11   CREATININE 0.91 1.13 1.03  CALCIUM 9.5 9.6 9.4   Liver Function Tests:  Recent Labs  03/17/13 1900  AST 21  ALT <5  ALKPHOS 103  BILITOT 0.6  PROT 8.0  ALBUMIN 4.6   No results found for this basename: LIPASE, AMYLASE,  in the last 8760 hours No results found for this basename: AMMONIA,  in the last 8760 hours CBC:  Recent Labs  03/17/13 1900 03/17/13 1933 03/18/13 0455 03/20/13 0753  WBC 4.6  --  6.4 6.0  NEUTROABS 2.6  --   --   --   HGB 15.3 16.7 13.0 13.1  HCT 44.4 49.0 37.8* 38.1*  MCV 93.7  --  91.5 93.4  PLT 193  --  186 184   Cardiac Enzymes:  Recent Labs  03/17/13 1900  TROPONINI <0.30    Radiological Exams:  03-17-13: ct of head: 1.  No acute intracranial abnormality. 2. Sinus disease.  03-17-13: left hip x-ray: No acute findings. Mild left and severe right hip osteoarthritis.   03-17-13: mri brain: 1. No acute intracranial infarct or other abnormality. 2. Mild age-related atrophy with chronic microvascular ischemic disease. 3. Remote right mid brain infarct, unchanged.   03-18-13: mra of brain: Unremarkable head MRA. No evidence of major intracranial arterial occlusion or flow limiting stenosis.   03-18-13: 2-d echo: Left ventricle: The cavity size was normal. Systolic function was normal. The estimated ejection fraction was in the range of 50% to 55%. Wall motion was normal; there were no regional wall motion abnormalities. - Left atrium: The atrium was mildly dilated. - Right atrium: The atrium was mildly dilated.  03-18-13; carotid doppler: The vertebral arteries appear patent with antegrade flow. - Findings consistent with1- 39 percent stenosis involving the right  internal carotid artery and the left internal carotid artery.   Assessment/Plan  TIA- has hx of CVA in past and had a new TIA. Continue to work with PT and OT. Monitor bp. Continue warfarin  Gait instability- to work with therapy to strengthen gait, fall precautions  Afib- rate controlled. Not on any rate controlling agent. Continue coumadin for goal inr 2-3  Hypertension- continue lisinopril 10 mg daily. Monitor bp. Check bmp  Bipolar disorder- is emotionally stable on depakote 250 mg twice daily and lamictal 150 mg twice daily. Continue this and monitor  Hyperlipidemia-  Continue atorvastatin and fish oil  parkinsonian symptom- will continue sinemet 25/250 mg twice daily and will monitor  Family/ staff Communication: reviewed care plan with patient and nursing supervisor   Goals of care: return  home after STR   Labs/tests ordered: cbc, cmp

## 2013-03-30 ENCOUNTER — Non-Acute Institutional Stay (SKILLED_NURSING_FACILITY): Payer: Medicare Other | Admitting: Adult Health

## 2013-03-30 DIAGNOSIS — I10 Essential (primary) hypertension: Secondary | ICD-10-CM

## 2013-03-30 DIAGNOSIS — R259 Unspecified abnormal involuntary movements: Secondary | ICD-10-CM

## 2013-03-30 DIAGNOSIS — R2681 Unsteadiness on feet: Secondary | ICD-10-CM

## 2013-03-30 DIAGNOSIS — R269 Unspecified abnormalities of gait and mobility: Secondary | ICD-10-CM

## 2013-03-30 DIAGNOSIS — I4891 Unspecified atrial fibrillation: Secondary | ICD-10-CM

## 2013-03-31 DIAGNOSIS — R259 Unspecified abnormal involuntary movements: Secondary | ICD-10-CM | POA: Insufficient documentation

## 2013-03-31 DIAGNOSIS — R29818 Other symptoms and signs involving the nervous system: Secondary | ICD-10-CM | POA: Insufficient documentation

## 2013-04-11 NOTE — Progress Notes (Signed)
Patient ID: [name] [name], male   DOB: [birth year], 68 y.o.   MRN: [id number]     [facility]  No Known Allergies   Chief Complaint  Patient presents with  . Discharge Note    HPI:  He is being discharged to home. He will not need home health or dme. He will need prescriptions. He had been hospitalized for weakness.   Past Medical History  Diagnosis Date  . HLD (hyperlipidemia)   . HTN (hypertension)   . Stroke 1991  . Atrial fibrillation   . Other and unspecified hyperlipidemia 03/20/2013    Past Surgical History  Procedure Laterality Date  . Tonsillectomy and adenoidectomy      VITAL SIGNS BP 137/81  Pulse 84  Ht 5\' 11"  (1.803 m)  Wt 196 lb (88.905 kg)  BMI 27.35 kg/m2   Patient's Medications  New Prescriptions   No medications on file  Previous Medications   CARBIDOPA-LEVODOPA (SINEMET IR) 25-250 MG PER TABLET    Take 1 tablet by mouth 2 (two) times daily.   DIVALPROEX (DEPAKOTE) 250 MG DR TABLET    Take 250 mg by mouth 2 (two) times daily.   FISH OIL-OMEGA-3 FATTY ACIDS 1000 MG CAPSULE    Take 1 g by mouth daily.    FOLIC ACID (FOLVITE) 1 MG TABLET    Take 1 mg by mouth daily.   LAMOTRIGINE (LAMICTAL) 150 MG TABLET    Take 150 mg by mouth 2 (two) times daily.   LISINOPRIL (PRINIVIL,ZESTRIL) 10 MG TABLET    Take 10 mg by mouth daily.   WARFARIN (COUMADIN) 5 MG TABLET    Take 5-7.5 mg by mouth daily. Take 7.5 mg on Tuesday, Thursday, and Saturday. All other days take 5 mg  Modified Medications   No medications on file  Discontinued Medications   No medications on file    SIGNIFICANT DIAGNOSTIC EXAMS  03-17-13: ct of head: 1.  No acute intracranial abnormality. 2. Sinus disease.  03-17-13: left hip x-ray: No acute findings. Mild left and severe right hip osteoarthritis.   03-17-13: mri brain: 1. No acute intracranial infarct or other abnormality. 2. Mild age-related atrophy with chronic microvascular ischemic disease. 3. Remote right mid brain infarct,  unchanged.   03-18-13: mra of brain: Unremarkable head MRA. No evidence of major intracranial arterial occlusion or flow limiting stenosis.   03-18-13: 2-d echo: Left ventricle: The cavity size was normal. Systolic function was normal. The estimated ejection fraction was in the range of 50% to 55%. Wall motion was normal; there were no regional wall motion abnormalities. - Left atrium: The atrium was mildly dilated. - Right atrium: The atrium was mildly dilated.  03-18-13; carotid doppler: The vertebral arteries appear patent with antegrade flow. - Findings consistent with1- 39 percent stenosis involving the right internal carotid artery and the left internal carotid artery.   LABS REVIEWED:   03-17-13: wbc 4.6; hgb 15.3; hct 44.4; mcv 93.7;plt 193; glucose 105; bun 8; creat 0.76; k+4.2 na++136  depakote <10.0 03-18-13: wbc 6.4; hgb 13.0; hct 37.8; mcv 91.5; plt 186; glucose 100; bun 11; creat 0.91; k+4.2; na++136 chol 93; ldl 34; trig 34; hgb a1c 6.1      Review of Systems  Constitutional: Negative for malaise/fatigue.  Eyes: Negative for blurred vision.  Respiratory: Negative for cough and shortness of breath.   Cardiovascular: Negative for chest pain, palpitations and leg swelling.  Gastrointestinal: Negative for heartburn, abdominal pain and constipation.  Musculoskeletal: Negative for back pain, joint  pain and myalgias.  Skin: Negative.   Neurological: Negative for weakness and headaches.  Psychiatric/Behavioral: Negative for depression. The patient is not nervous/anxious.     Physical Exam  Constitutional: He is oriented to person, place, and time. He appears well-developed and well-nourished. No distress.  Neck: Neck supple. No JVD present.  Cardiovascular: Normal rate, regular rhythm and intact distal pulses.   Respiratory: Effort normal and breath sounds normal. No respiratory distress. He has no wheezes.  GI: Soft. Bowel sounds are normal. He exhibits no distension. There is  no tenderness.  Musculoskeletal: Normal range of motion. He exhibits no edema.  Is ambulating with a walker. Was ambulating independently prior to hospitalization.   Neurological: He is alert and oriented to person, place, and time.  Skin: Skin is warm and dry. He is not diaphoretic.  Psychiatric: He has a normal mood and affect.     ASSESSMENT/ PLAN:  Will discharge her to home with no home health. He will not need dme. His prescriptions have been written. He will need an inr on 04-04-13.

## 2013-04-26 ENCOUNTER — Ambulatory Visit (INDEPENDENT_AMBULATORY_CARE_PROVIDER_SITE_OTHER): Payer: Medicare Other | Admitting: Neurology

## 2013-04-26 ENCOUNTER — Encounter: Payer: Self-pay | Admitting: Neurology

## 2013-04-26 VITALS — BP 130/76 | HR 76 | Ht 70.0 in | Wt 202.0 lb

## 2013-04-26 DIAGNOSIS — R2681 Unsteadiness on feet: Secondary | ICD-10-CM

## 2013-04-26 DIAGNOSIS — H532 Diplopia: Secondary | ICD-10-CM

## 2013-04-26 DIAGNOSIS — R269 Unspecified abnormalities of gait and mobility: Secondary | ICD-10-CM

## 2013-04-26 MED ORDER — ACETAZOLAMIDE 250 MG PO TABS: 250.0000 mg | ORAL_TABLET | Freq: Two times a day (BID) | ORAL | Status: DC

## 2013-04-26 NOTE — Patient Instructions (Signed)
I'm not really sure what the cause of your symptoms are.  It could be TIA but it would be unusual to present the same way.  There is a condition called episodic ataxia.  You don't really fit the profile but I would like to try a medication that is typically used to treat it.  We will start acetazolamide 250mg  twice daily.  Side effects include numbness and tingling, fatigue, dizziness, ringing in the ear, dark stool and it can dry you out, so you should drink fluids.  Follow up with [facility].  I want to see you in 4 weeks with update.

## 2013-04-26 NOTE — Progress Notes (Signed)
NEUROLOGY CONSULTATION NOTE  [name] [name] MRN: [id number] DOB: 1946-03-01  Referring provider: Dr. Shelia Media Primary care provider: Dr. Shelia Media  Reason for consult:  Episodic diplopia and gait instability.  HISTORY OF PRESENT ILLNESS: [name] [name] is a 68 year old right-handed man with history of atrial fibrillation on AC, hypertension, hyperlipidemia and bipolar disorder who presents for hospital follow-up for TIA.  He is accompanied by his wife and daughter.  Records and images were personally reviewed where available.    He was admitted to [facility] on 03/17/13 with horizontal double vision and gait instability.  In the ED, his INR was 2.40.  CT of the brain was negative.  MRI of the brain revealed mild chronic small vessel disease but no acute infarct or bleed.  MRA of the head was unremarkable.  2D Echo revealed LVEF of 50-55% with no source of emboli.  Carotid dopplers revealed no hemodynamically significant stenosis.  Hgb A1c was 6.1 with mean plasma glucose of 128.  LDL was 34.  He reportedly has been experiencing intermittent episodes of horizontal diplopia and gait instability for at least 5 years.  It can occur any time of day.  When he walks, he will tip over either side.  There is no weakness or numbness in the feet.  There is no headache, nausea, tinnitus, hearing loss, vertigo or lightheadedness.  When he gets these attacks, he denies unilateral numbness, weakness, dysphagia or slurred speech.  It seems to be triggered by stress.  It can last anywhere from 20 minutes to hours to a few days.  He has been admitted to the hospital on at least 4 different occasions and underwent a negative stroke workup.  He had a stroke in 1991, in which he presented with similar symptoms, such as diplopia and gait instability.  He also had left sided incoordination, which he still has some residual symptoms.  He was found to have atrial fibrillation and has been on warfarin since then.  He apparently  does not always remember to take his medications correctly.  He takes Depakote and Lamictal for mood.  He takes Sinemet for restless leg.  No history of regular migraine but can recall on a couple of occasions having severe headache.  No family history of episodic gait problems.  He is planning on seeing a [facility]ophthalm[facility] at [facility].  PAST MEDICAL HISTORY: Past Medical History  Diagnosis Date  . HLD (hyperlipidemia)   . HTN (hypertension)   . Stroke 1991  . Atrial fibrillation   . Other and unspecified hyperlipidemia 03/20/2013    PAST SURGICAL HISTORY: Past Surgical History  Procedure Laterality Date  . Tonsillectomy and adenoidectomy      MEDICATIONS: Current Outpatient Prescriptions on File Prior to Visit  Medication Sig Dispense Refill  . carbidopa-levodopa (SINEMET IR) 25-250 MG per tablet Take 1 tablet by mouth 2 (two) times daily.      . divalproex (DEPAKOTE) 250 MG DR tablet Take 250 mg by mouth 2 (two) times daily.      . fish oil-omega-3 fatty acids 1000 MG capsule Take 1 g by mouth daily.       . folic acid (FOLVITE) 1 MG tablet Take 1 mg by mouth daily.      Marland Kitchen lamoTRIgine (LAMICTAL) 150 MG tablet Take 150 mg by mouth 2 (two) times daily.      Marland Kitchen lisinopril (PRINIVIL,ZESTRIL) 10 MG tablet Take 10 mg by mouth daily.      Marland Kitchen warfarin (COUMADIN) 5  MG tablet Take 5-7.5 mg by mouth daily. Take 7.5 mg on Tuesday, Thursday, and Saturday. All other days take 5 mg       No current facility-administered medications on file prior to visit.    ALLERGIES: No Known Allergies  FAMILY HISTORY: Family History  Problem Relation Age of Onset  . Suicidality    . Cancer    . Alcohol abuse      SOCIAL HISTORY: History   Social History  . Marital Status: Married    Spouse Name: N/A    Number of Children: 3  . Years of Education: N/A   Occupational History  . retired    Social History Main Topics  . Smoking status: Never Smoker   . Smokeless tobacco: Not on  file  . Alcohol Use: 0.6 oz/week    1 Shots of liquor per week     Comment: occasional  . Drug Use: Not on file  . Sexual Activity: Not on file   Other Topics Concern  . Not on file   Social History Narrative  . No narrative on file    REVIEW OF SYSTEMS: Constitutional: No fevers, chills, or sweats, no generalized fatigue, change in appetite Eyes: No visual changes, double vision, eye pain Ear, nose and throat: No hearing loss, ear pain, nasal congestion, sore throat Cardiovascular: No chest pain, palpitations Respiratory:  No shortness of breath at rest or with exertion, wheezes GastrointestinaI: No nausea, vomiting, diarrhea, abdominal pain, fecal incontinence Genitourinary:  No dysuria, urinary retention or frequency Musculoskeletal:  No neck pain, back pain Integumentary: No rash, pruritus, skin lesions Neurological: as above Psychiatric: No depression, insomnia, anxiety Endocrine: No palpitations, fatigue, diaphoresis, mood swings, change in appetite, change in weight, increased thirst Hematologic/Lymphatic:  No anemia, purpura, petechiae. Allergic/Immunologic: no itchy/runny eyes, nasal congestion, recent allergic reactions, rashes  PHYSICAL EXAM: Filed Vitals:   04/26/13 1359  BP: 130/76  Pulse: 76   General: No acute distress Head:  Normocephalic/atraumatic Neck: supple, no paraspinal tenderness, full range of motion Back: No paraspinal tenderness Heart: regular rate and rhythm Lungs: Clear to auscultation bilaterally. Vascular: No carotid bruits. Neurological Exam: Mental status: alert and oriented to person, place, and time, speech fluent and not dysarthric, language intact. Cranial nerves: CN I: not tested CN II: pupils equal, round and reactive to light, visual fields intact, fundi unremarkable. CN III, IV, VI:  full range of motion, no nystagmus, no ptosis CN V: facial sensation intact CN VII: upper and lower face symmetric CN VIII: hearing intact CN  IX, X: gag intact, uvula midline CN XI: sternocleidomastoid and trapezius muscles intact CN XII: tongue midline Bulk & Tone: normal, no fasciculations. Motor: 5/5 throughout Sensation: pinprick and vibration intact. Deep Tendon Reflexes: 3+ in LUE and left patellar, otherwise 2+.  Left toe up.  Right toe down Finger to nose testing: slowed with mild dysmetria on the left. Heel to shin: mild dysmetria with left heel on right shin. Gait: wide-based, mild stumbling and unsteadiness.  Stumbles a bit when turns.  Unable to walk in tandem. Romberg with sway.  IMPRESSION: Episodic diplopia and gait instability.  Unclear etiology.  From a secondary stroke prevention standpoint, there really is nothing else to do that would change management.  It is unusual to have multiple TIAs with stereotypical presentation.  No focal stenosis to explain this.  Cardioembolic etiology wouldn't produce stereotypic TIA symptoms and he is already on anticoagulation, anyway.  Another consideration is episodic ataxia type II.  However,  onset of symptoms are much older than would be expected.  Migraine variant is possible but no real history of migraines.  Does not sound like seizure.  Unlikely to be Idamae Schuller Syndrome as it is repetitive over several years.  PLAN: 1.  For the sake of trying something different, we will start acetazolamide 250mg  twice daily.  Side effects were discussed. 2.  Continue secondary stroke prevention (warfarin, statin) 3.  Follow up with [facility] as planned 4.  Follow up in 4 weeks.  Thank you for allowing me to take part in the care of this patient.  Metta Clines, DO  CC:  Horatio Pel, MD

## 2013-05-11 NOTE — Telephone Encounter (Signed)
Pt never was a no show and cancelled several apts. Closing encounter

## 2013-05-23 ENCOUNTER — Telehealth: Payer: Self-pay | Admitting: Neurology

## 2013-05-23 NOTE — Telephone Encounter (Signed)
Please call [phone], re: pt has appt tomorrow w/ Dr. Tomi Likens. Was he supposed to start medications prior to this visit? / Sherri

## 2013-05-24 ENCOUNTER — Encounter: Payer: Self-pay | Admitting: Neurology

## 2013-05-24 ENCOUNTER — Ambulatory Visit (INDEPENDENT_AMBULATORY_CARE_PROVIDER_SITE_OTHER): Payer: Medicare Other | Admitting: Neurology

## 2013-05-24 VITALS — BP 130/76 | HR 72 | Temp 97.0°F | Resp 18 | Ht 71.0 in | Wt 196.6 lb

## 2013-05-24 DIAGNOSIS — R269 Unspecified abnormalities of gait and mobility: Secondary | ICD-10-CM

## 2013-05-24 DIAGNOSIS — H532 Diplopia: Secondary | ICD-10-CM

## 2013-05-24 NOTE — Patient Instructions (Signed)
I don't think you need to continue the medication I prescribed (acetazolamide), because I don't think you have what that would treat.  I want to check the blood for evidence of myasthenia gravis, a muscle disease that can cause intermittent double vision.  Otherwise, we will monitor.  Follow up in 3 months.  Call with concerns.

## 2013-05-24 NOTE — Telephone Encounter (Signed)
Pt called back this AM, he says he has taken the medication. He will plan to keep this afternoon's appt w/ Dr. Tomi Likens / Gayleen Orem.

## 2013-05-24 NOTE — Progress Notes (Signed)
NEUROLOGY FOLLOW UP OFFICE NOTE  [name] [name] [id number]  HISTORY OF PRESENT ILLNESS: [name] [name] is a 68 year old right-handed man with history of atrial fibrillation on AC, hypertension, hyperlipidemia and bipolar disorder who follows up for episodic diplopia and gait instability.  He is accompanied by his wife and daughter.  Records and images were personally reviewed where available.    UPDATE: He was started on acetazolamide 250mg  twice daily.  He has not had any episodes but they don't occur too frequently anyway.  If he doesn't think and turns too fast, he does stumble. He saw Dr. Frederico Hamman at [facility] on 05/06/13.  Exam findings were unremarkable.  He suggested possible intermittent right INO.  HISTORY: He was admitted to [facility] on 03/17/13 with horizontal double vision and gait instability.  In the ED, his INR was 2.40.  CT of the brain was negative.  MRI of the brain revealed mild chronic small vessel disease but no acute infarct or bleed.  MRA of the head was unremarkable.  2D Echo revealed LVEF of 50-55% with no source of emboli.  Carotid dopplers revealed no hemodynamically significant stenosis.  Hgb A1c was 6.1 with mean plasma glucose of 128.  LDL was 34.  He reportedly has been experiencing intermittent episodes of horizontal diplopia and gait instability for at least 5 years.  It can occur any time of day.  When he walks, he will tip over either side.  There is no weakness or numbness in the feet.  There is no headache, nausea, tinnitus, hearing loss, vertigo or lightheadedness.  When he gets these attacks, he denies unilateral numbness, weakness, dysphagia or slurred speech.  It seems to be triggered by stress.  It can last anywhere from 20 minutes to hours to a few days.  He has been admitted to the hospital on at least 4 different occasions and underwent a negative stroke workup.  He was formerly seen by Dr. Erling Cruz at [facility], who couldn't come up  with an answer.   He had a stroke in 1991, in which he presented with similar symptoms, such as diplopia and gait instability.  He also had left sided incoordination, which he still has some residual symptoms.  He was found to have atrial fibrillation and has been on warfarin since then.  He apparently does not always remember to take his medications correctly.  He takes Depakote and Lamictal for mood.  He takes Sinemet for restless leg.  No history of regular migraine but can recall on a couple of occasions having severe headache.  No family history of episodic gait problems.  PAST MEDICAL HISTORY: Past Medical History  Diagnosis Date  . HLD (hyperlipidemia)   . HTN (hypertension)   . Stroke 1991  . Atrial fibrillation   . Other and unspecified hyperlipidemia 03/20/2013    MEDICATIONS: Current Outpatient Prescriptions on File Prior to Visit  Medication Sig Dispense Refill  . acetaZOLAMIDE (DIAMOX) 250 MG tablet Take 1 tablet (250 mg total) by mouth 2 (two) times daily.  60 tablet  0  . carbidopa-levodopa (SINEMET IR) 25-250 MG per tablet Take 1 tablet by mouth 2 (two) times daily.      . divalproex (DEPAKOTE) 250 MG DR tablet Take 250 mg by mouth 2 (two) times daily.      . fish oil-omega-3 fatty acids 1000 MG capsule Take 1 g by mouth daily.       . folic acid (FOLVITE) 1 MG tablet  Take 1 mg by mouth daily.      Marland Kitchen lamoTRIgine (LAMICTAL) 150 MG tablet Take 150 mg by mouth 2 (two) times daily.      Marland Kitchen lisinopril (PRINIVIL,ZESTRIL) 10 MG tablet Take 10 mg by mouth daily.      Marland Kitchen warfarin (COUMADIN) 5 MG tablet Take 5-7.5 mg by mouth daily. Take 7.5 mg on Tuesday, Thursday, and Saturday. All other days take 5 mg       No current facility-administered medications on file prior to visit.    ALLERGIES: No Known Allergies  FAMILY HISTORY: Family History  Problem Relation Age of Onset  . Suicidality    . Cancer    . Alcohol abuse      SOCIAL HISTORY: History   Social History  .  Marital Status: Married    Spouse Name: N/A    Number of Children: 3  . Years of Education: N/A   Occupational History  . retired    Social History Main Topics  . Smoking status: Never Smoker   . Smokeless tobacco: Not on file  . Alcohol Use: 0.6 oz/week    1 Shots of liquor per week     Comment: occasional  . Drug Use: Not on file  . Sexual Activity: Not on file   Other Topics Concern  . Not on file   Social History Narrative  . No narrative on file    REVIEW OF SYSTEMS: Constitutional: No fevers, chills, or sweats, no generalized fatigue, change in appetite Eyes: No visual changes, double vision, eye pain Ear, nose and throat: No hearing loss, ear pain, nasal congestion, sore throat Cardiovascular: No chest pain, palpitations Respiratory:  No shortness of breath at rest or with exertion, wheezes GastrointestinaI: No nausea, vomiting, diarrhea, abdominal pain, fecal incontinence Genitourinary:  No dysuria, urinary retention or frequency Musculoskeletal:  No neck pain, back pain Integumentary: No rash, pruritus, skin lesions Neurological: as above Psychiatric: No depression, insomnia, anxiety Endocrine: No palpitations, fatigue, diaphoresis, mood swings, change in appetite, change in weight, increased thirst Hematologic/Lymphatic:  No anemia, purpura, petechiae. Allergic/Immunologic: no itchy/runny eyes, nasal congestion, recent allergic reactions, rashes  PHYSICAL EXAM: Filed Vitals:   05/24/13 1404  BP: 130/76  Pulse: 72  Temp: 97 F (36.1 C)  Resp: 18   General: No acute distress Head:  Normocephalic/atraumatic Neck: supple, no paraspinal tenderness, full range of motion Heart:  Regular rate and rhythm Lungs:  Clear to auscultation bilaterally Back: No paraspinal tenderness Neurological Exam: alert and oriented to person, place, and time. Attention span and concentration intact, recent and remote memory intact, fund of knowledge intact.  Speech fluent and not  dysarthric, language intact.  CN II-XII intact. Fundoscopic exam unremarkable without vessel changes, exudates, hemorrhages or papilledema.  Bulk and tone normal, muscle strength 5/5 throughout.  Sensation to light touch, temperature and vibration intact.  Deep tendon reflexes 2+ throughout, toes downgoing.  Finger to nose and heel to shin testing intact.  Gait normal stride but stumbled a bit when he first turned around too fast, Romberg negative.  IMPRESSION: Intermittent horizontal diplopia Gait instability  PLAN: 1.  I would stop acetazolamide, as I don't think he has episodic ataxia anyway. 2.  We will check a myasthenia panel 3.  Follow up in 3 months.  Monitor for now.  30 minutes spent with patient, over 50% spent counseling and coordinating care.  Metta Clines, DO  CC:  Horatio Pel, MD

## 2013-05-26 LAB — ACETYLCHOLINE RECEPTOR, BINDING: A CHR BINDING ABS: <0.3 nmol/L (ref <=0.30)

## 2013-05-30 LAB — STRIATED MUSCLE ANTIBODY: Striated Muscle Ab: <1:40 {titer}

## 2013-06-02 ENCOUNTER — Other Ambulatory Visit: Payer: Self-pay | Admitting: *Deleted

## 2013-06-02 NOTE — Telephone Encounter (Signed)
Patients wife called in requesting a refill on this medication. She states that he needs to be on this medication and did well on it. He should have never stop this medication. Patient will be traveling over the weekend and really need it if at all possible

## 2013-06-06 MED ORDER — ACETAZOLAMIDE 250 MG PO TABS: 250.0000 mg | ORAL_TABLET | Freq: Two times a day (BID) | ORAL | Status: DC

## 2013-06-06 NOTE — Telephone Encounter (Signed)
I have left a message for patient to call regarding medication

## 2013-06-06 NOTE — Telephone Encounter (Signed)
I stopped Diamox because I don't think he has for what this medication is indicated

## 2013-06-10 ENCOUNTER — Telehealth: Payer: Self-pay | Admitting: Neurology

## 2013-06-10 NOTE — Telephone Encounter (Signed)
Spoke with patient about RX available for pick up

## 2013-06-10 NOTE — Telephone Encounter (Signed)
Needs to talk to someone today about medication please call

## 2013-06-22 ENCOUNTER — Telehealth: Payer: Self-pay | Admitting: Neurology

## 2013-06-22 NOTE — Telephone Encounter (Signed)
CB [phone], pt having episodes all the time now. Wants appt ASAP. Believes pt is unable to wait until next availbable appt. Please call / Sherri

## 2013-06-22 NOTE — Telephone Encounter (Signed)
I tried calling patient but got a phone message stating  The patient is not excepting calls at this time .

## 2013-06-22 NOTE — Telephone Encounter (Signed)
Patient is having episodes of the dizziness really bad he is asking for an appt  One was made 06/28/13 at 3:45

## 2013-06-28 ENCOUNTER — Encounter: Payer: Self-pay | Admitting: Neurology

## 2013-06-28 ENCOUNTER — Ambulatory Visit (INDEPENDENT_AMBULATORY_CARE_PROVIDER_SITE_OTHER): Payer: Medicare Other | Admitting: Neurology

## 2013-06-28 VITALS — BP 116/68 | HR 72 | Temp 98.0°F | Resp 16 | Ht 71.0 in | Wt 191.8 lb

## 2013-06-28 DIAGNOSIS — R413 Other amnesia: Secondary | ICD-10-CM

## 2013-06-28 DIAGNOSIS — H532 Diplopia: Secondary | ICD-10-CM

## 2013-06-28 NOTE — Patient Instructions (Signed)
I'm not sure of the cause of your spells, but I highly doubt they are TIAs as it is very unusual to have TIAs over the years with the same symptoms and no blockage of the artery without having already had a large stroke.  We will continue the acetazolamide for now.  Follow up in a month to decide next steps. We will refer to Dr. Valentina Shaggy for neuropsychological testing. We will check vitamin B12 and Thyroid.

## 2013-06-28 NOTE — Progress Notes (Signed)
NEUROLOGY FOLLOW UP OFFICE NOTE  [name] [name] [id number]  HISTORY OF PRESENT ILLNESS: [name] [name] is a 68 year old right-handed man with history of atrial fibrillation on AC, hypertension, hyperlipidemia and bipolar disorder who follows up for episodic diplopia and gait instability.  He is accompanied by his wife and daughter.  Records and images were personally reviewed where available.    UPDATE: His wife would like him to remain on the acetazolamide.  When he was on it, he reportedly didn't have any spells.  Once it was discontinued, he had spells daily for about 4 days until he resumed the medication.  The patient, himself, doesn't think that it made a difference.  He misplaces objects.  For example, he lost his glasses.  He has become worse with directions when driving on familiar routes.  He has trouble with names of acquaintances.  He forgets names of known streets.  He manages the finances but his wife notes that there has been some late or missed payments of the bills.  He feels that his Bipolar is under control.  He denies family history of dementia.  His wife also is concerned about his memory, which has been an issue for many years but has progressively gotten worse.  He   HISTORY: He was admitted to [facility] on 03/17/13 with horizontal double vision and gait instability.  In the ED, his INR was 2.40.  CT of the brain was negative.  MRI of the brain revealed mild chronic small vessel disease but no acute infarct or bleed.  MRA of the head was unremarkable.  2D Echo revealed LVEF of 50-55% with no source of emboli.  Carotid dopplers revealed no hemodynamically significant stenosis.  Hgb A1c was 6.1 with mean plasma glucose of 128.  LDL was 34.  He reportedly has been experiencing intermittent episodes of horizontal diplopia and gait instability for at least 5 years.  It can occur any time of day.  When he walks, he will tip over either side.  There is no weakness or numbness in the  feet.  There is no headache, nausea, tinnitus, hearing loss, vertigo or lightheadedness.  When he gets these attacks, he denies unilateral numbness, weakness, dysphagia or slurred speech.  It seems to be triggered by stress.  It can last anywhere from 20 minutes to hours to a few days.  He has been admitted to the hospital on at least 4 different occasions and underwent a negative stroke workup.  He was formerly seen by Dr. Erling Cruz at [facility], who couldn't come up with an answer.  He saw Dr. Frederico Hamman at [facility] on 05/06/13.  Exam findings were unremarkable.  He suggested possible intermittent right INO.  He was initially started on acetazolamide 250mg  twice daily but this was discontinued as he likely does not have episodic ataxia.  He had a stroke in 1991, in which he presented with similar symptoms, such as diplopia and gait instability.  He also had left sided incoordination, which he still has some residual symptoms.  He was found to have atrial fibrillation and has been on warfarin since then.  He apparently does not always remember to take his medications correctly.  He takes Depakote and Lamictal for mood.  He takes Sinemet for restless leg.  No history of regular migraine but can recall on a couple of occasions having severe headache.  No family history of episodic gait problems.  PAST MEDICAL HISTORY: Past Medical History  Diagnosis  Date  . HLD (hyperlipidemia)   . HTN (hypertension)   . Stroke 1991  . Atrial fibrillation   . Other and unspecified hyperlipidemia 03/20/2013    MEDICATIONS: Current Outpatient Prescriptions on File Prior to Visit  Medication Sig Dispense Refill  . acetaZOLAMIDE (DIAMOX) 250 MG tablet Take 1 tablet (250 mg total) by mouth 2 (two) times daily.  60 tablet  0  . carbidopa-levodopa (SINEMET IR) 25-250 MG per tablet Take 1 tablet by mouth 2 (two) times daily.      . divalproex (DEPAKOTE) 250 MG DR tablet Take 250 mg by mouth 2 (two)  times daily.      . fish oil-omega-3 fatty acids 1000 MG capsule Take 1 g by mouth daily.       . folic acid (FOLVITE) 1 MG tablet Take 1 mg by mouth daily.      Marland Kitchen lamoTRIgine (LAMICTAL) 150 MG tablet Take 150 mg by mouth 2 (two) times daily.      Marland Kitchen lisinopril (PRINIVIL,ZESTRIL) 10 MG tablet Take 10 mg by mouth daily.      Marland Kitchen warfarin (COUMADIN) 5 MG tablet Take 5-7.5 mg by mouth daily. Take 7.5 mg on Tuesday, Thursday, and Saturday. All other days take 5 mg       No current facility-administered medications on file prior to visit.    ALLERGIES: No Known Allergies  FAMILY HISTORY: Family History  Problem Relation Age of Onset  . Suicidality    . Cancer    . Alcohol abuse      SOCIAL HISTORY: History   Social History  . Marital Status: Married    Spouse Name: N/A    Number of Children: 3  . Years of Education: N/A   Occupational History  . retired    Social History Main Topics  . Smoking status: Never Smoker   . Smokeless tobacco: Not on file  . Alcohol Use: No     Comment: occasional  . Drug Use: Not on file  . Sexual Activity: Not on file   Other Topics Concern  . Not on file   Social History Narrative  . No narrative on file    REVIEW OF SYSTEMS: Constitutional: No fevers, chills, or sweats, no generalized fatigue, change in appetite Eyes: No visual changes, double vision, eye pain Ear, nose and throat: No hearing loss, ear pain, nasal congestion, sore throat Cardiovascular: No chest pain, palpitations Respiratory:  No shortness of breath at rest or with exertion, wheezes GastrointestinaI: No nausea, vomiting, diarrhea, abdominal pain, fecal incontinence Genitourinary:  No dysuria, urinary retention or frequency Musculoskeletal:  No neck pain, back pain Integumentary: No rash, pruritus, skin lesions Neurological: as above Psychiatric: No depression, insomnia, anxiety Endocrine: No palpitations, fatigue, diaphoresis, mood swings, change in appetite, change  in weight, increased thirst Hematologic/Lymphatic:  No anemia, purpura, petechiae. Allergic/Immunologic: no itchy/runny eyes, nasal congestion, recent allergic reactions, rashes  PHYSICAL EXAM: Filed Vitals:   06/28/13 1549  BP: 116/68  Pulse: 72  Temp: 98 F (36.7 C)  Resp: 16   General: No acute distress Head:  Normocephalic/atraumatic Neuro:  alert and oriented to person and place.  Had minor problems with time (said it was Jul 01 2013 instead of June 28 2013). Attention span and concentration intact, recent and remote memory intact, fund of knowledge intact.  Able to draw the Trail Making test.  Difficulty with copying a cube.  Able to draw a clock to requested time but forgot the 12.  Naming, repeating,  reading intact.  Naming fluency intact.  Serial 7 subtraction intact.  Abstraction intact.  Recalled only 1 of 5 words after 5 minutes.  MOCA 22/30.   IMPRESSION: Intermittent diplopia.  Etiology unknown but I doubt TIA.  He has no posterior circulation stenosis.  Also, a TIA would not present the same symptoms over and over again over several years, especially in absence of a focal arterial stenosis.  Already on warfarin. Cognitive problems, particularly memory and visuospatial domains. cerbrovascular disease  PLAN: 1.  Will continue the acetazolamide for now, at his wife's request, to see how continues to do.  Re-evaluate in 4 weeks. 2.  Refer for neuropsychological testing. 3.  Check B12, TSH, MMA   45 minutes spent with patient and wife, over 50% spent counseling and coordinating care.  Metta Clines, DO  CC:  Horatio Pel, MD

## 2013-06-29 ENCOUNTER — Telehealth: Payer: Self-pay | Admitting: Neurology

## 2013-06-29 NOTE — Addendum Note (Signed)
Addended by: Ella Jubilee on: 06/29/2013 07:39 AM   Modules accepted: Orders

## 2013-06-29 NOTE — Telephone Encounter (Signed)
Pt's daughter returning our call, she is unsure who called or why. Her dad was seen yesterday / Sherri S.

## 2013-06-30 NOTE — Addendum Note (Signed)
Addended by: Melissa Noon C on: 06/30/2013 11:07 AM   Modules accepted: Orders

## 2013-07-27 ENCOUNTER — Ambulatory Visit (INDEPENDENT_AMBULATORY_CARE_PROVIDER_SITE_OTHER): Payer: Medicare Other | Admitting: Neurology

## 2013-07-27 ENCOUNTER — Encounter: Payer: Self-pay | Admitting: Neurology

## 2013-07-27 VITALS — BP 122/78 | HR 79 | Temp 97.5°F | Resp 16 | Ht 70.0 in | Wt 193.2 lb

## 2013-07-27 DIAGNOSIS — R413 Other amnesia: Secondary | ICD-10-CM

## 2013-07-27 DIAGNOSIS — R42 Dizziness and giddiness: Secondary | ICD-10-CM

## 2013-07-27 DIAGNOSIS — H532 Diplopia: Secondary | ICD-10-CM

## 2013-07-27 MED ORDER — ACETAZOLAMIDE 250 MG PO TABS: 250.0000 mg | ORAL_TABLET | Freq: Two times a day (BID) | ORAL | Status: DC

## 2013-07-27 NOTE — Progress Notes (Signed)
NEUROLOGY FOLLOW UP OFFICE NOTE  [name] [name] [id number]  HISTORY OF PRESENT ILLNESS: [name] [name] is a 68 year old right-handed man with history of atrial fibrillation on AC, hypertension, hyperlipidemia and bipolar disorder who follows up for episodic diplopia and gait instability.  He is accompanied by his wife.  UPDATE: He was continued on the acetazolamide 250mg  at the insistence of his wife but has only been taking it once daily.  He has not had any other spells since last visit.  His wife feels that the acetazolamide is helping in this regard.  He is feeling well.  HISTORY: He was admitted to [facility] on 03/17/13 with horizontal double vision and gait instability.  In the ED, his INR was 2.40.  CT of the brain was negative.  MRI of the brain revealed mild chronic small vessel disease but no acute infarct or bleed.  MRA of the head was unremarkable.  2D Echo revealed LVEF of 50-55% with no source of emboli.  Carotid dopplers revealed no hemodynamically significant stenosis.  Hgb A1c was 6.1 with mean plasma glucose of 128.  LDL was 34.  He reportedly has been experiencing intermittent episodes of horizontal diplopia and gait instability for at least 5 years.  It can occur any time of day.  When he walks, he will tip over either side.  There is no weakness or numbness in the feet.  There is no headache, nausea, tinnitus, hearing loss, vertigo or lightheadedness.  When he gets these attacks, he denies unilateral numbness, weakness, dysphagia or slurred speech.  It seems to be triggered by stress.  It can last anywhere from 20 minutes to hours to a few days.  He has been admitted to the hospital on at least 4 different occasions and underwent a negative stroke workup.  He was formerly seen by Dr. Erling Cruz at [facility], who couldn't come up with an answer.  He saw Dr. Frederico Hamman at [facility] on 05/06/13.  Exam findings were unremarkable.  He suggested possible intermittent  right INO.  He was initially started on acetazolamide 250mg  twice daily but this was discontinued as he likely does not have episodic ataxia.  However, his wife felt that it was helping.  Myasthenia panel was negative.  His wife also notes memory problems.  He misplaces objects.  For example, he lost his glasses.  He has become worse with directions when driving on familiar routes.  He has trouble with names of acquaintances.  He forgets names of known streets.  He manages the finances but his wife notes that there has been some late or missed payments of the bills.  He feels that his Bipolar is under control.  He denies family history of dementia.  Last visit, [place] was 22/30.  He had a stroke in 1991, in which he presented with similar symptoms, such as diplopia and gait instability.  He also had left sided incoordination, which he still has some residual symptoms.  He was found to have atrial fibrillation and has been on warfarin since then.  He apparently does not always remember to take his medications correctly.  He takes Depakote and Lamictal for mood.  He takes Sinemet for restless leg.  No history of regular migraine but can recall on a couple of occasions having severe headache.  No family history of episodic gait problems.  PAST MEDICAL HISTORY: Past Medical History  Diagnosis Date  . HLD (hyperlipidemia)   . HTN (hypertension)   .  Stroke 1991  . Atrial fibrillation   . Other and unspecified hyperlipidemia 03/20/2013    MEDICATIONS: Current Outpatient Prescriptions on File Prior to Visit  Medication Sig Dispense Refill  . carbidopa-levodopa (SINEMET IR) 25-250 MG per tablet Take 1 tablet by mouth 2 (two) times daily.      . divalproex (DEPAKOTE) 250 MG DR tablet Take 250 mg by mouth 2 (two) times daily.      . fish oil-omega-3 fatty acids 1000 MG capsule Take 1 g by mouth daily.       . folic acid (FOLVITE) 1 MG tablet Take 1 mg by mouth daily.      Marland Kitchen lamoTRIgine (LAMICTAL) 150 MG  tablet Take 150 mg by mouth 2 (two) times daily.      Marland Kitchen lisinopril (PRINIVIL,ZESTRIL) 10 MG tablet Take 10 mg by mouth daily.      Marland Kitchen warfarin (COUMADIN) 5 MG tablet Take 7.5-10 mg by mouth daily. Take 7.5 mg on Tuesday, Thursday, and Saturday. All other days take 10 mg       No current facility-administered medications on file prior to visit.    ALLERGIES: No Known Allergies  FAMILY HISTORY: Family History  Problem Relation Age of Onset  . Suicidality    . Cancer    . Alcohol abuse      SOCIAL HISTORY: History   Social History  . Marital Status: Married    Spouse Name: N/A    Number of Children: 3  . Years of Education: N/A   Occupational History  . retired    Social History Main Topics  . Smoking status: Never Smoker   . Smokeless tobacco: Not on file  . Alcohol Use: No     Comment: occasional  . Drug Use: Not on file  . Sexual Activity: Not on file   Other Topics Concern  . Not on file   Social History Narrative  . No narrative on file    REVIEW OF SYSTEMS: Constitutional: No fevers, chills, or sweats, no generalized fatigue, change in appetite Eyes: No visual changes, double vision, eye pain Ear, nose and throat: No hearing loss, ear pain, nasal congestion, sore throat Cardiovascular: No chest pain, palpitations Respiratory:  No shortness of breath at rest or with exertion, wheezes GastrointestinaI: No nausea, vomiting, diarrhea, abdominal pain, fecal incontinence Genitourinary:  No dysuria, urinary retention or frequency Musculoskeletal:  No neck pain, back pain Integumentary: No rash, pruritus, skin lesions Neurological: as above Psychiatric: No depression, insomnia, anxiety Endocrine: No palpitations, fatigue, diaphoresis, mood swings, change in appetite, change in weight, increased thirst Hematologic/Lymphatic:  No anemia, purpura, petechiae. Allergic/Immunologic: no itchy/runny eyes, nasal congestion, recent allergic reactions, rashes  PHYSICAL  EXAM: Filed Vitals:   07/27/13 1527  BP: 122/78  Pulse: 79  Temp: 97.5 F (36.4 C)  Resp: 16   General: No acute distress Head:  Normocephalic/atraumatic Neck: supple, no paraspinal tenderness, full range of motion Heart:  Regular rate and rhythm Lungs:  Clear to auscultation bilaterally Back: No paraspinal tenderness Neurological Exam: alert and oriented to person, place, and time. Attention span and concentration intact, recent and remote memory intact, fund of knowledge intact.  Speech fluent and not dysarthric, language intact.  CN II-XII intact. Fundoscopic exam unremarkable without vessel changes, exudates, hemorrhages or papilledema.  Bulk and tone normal, muscle strength 5/5 throughout.  Sensation to light touch, temperature and vibration intact.  Deep tendon reflexes 3+ in LUE and left patellar, otherwise 2+.  Finger to nose and heel  to shin testing intact.  Gait with normal stride, Romberg negative.  IMPRESSION: Transient episodes of diplopia and gait instability.  Etiology unknown. Memory problems.  PLAN: 1.  Will continue acetazolamide 250mg  daily for now. 2.  He will return in 3 months.  At that time, we may consider stopping it to see if he has any recurrence of episodes.  In the meantime we will monitor for any recurrence of episodes. 3.  Check BMP 4.  Neuropsych testing.  Metta Clines, DO  CC:  Horatio Pel, MD

## 2013-07-27 NOTE — Patient Instructions (Addendum)
1.  Continue acetazolamide 250mg   daily 2.  We will check some blood work BMP 3.  Follow up in 3 months.

## 2013-07-29 ENCOUNTER — Telehealth: Payer: Self-pay | Admitting: *Deleted

## 2013-07-29 NOTE — Telephone Encounter (Signed)
I left a voicemail on Dr Valentina Shaggy answering machine about appt on this patient per the computer this referral was sent on 06/29/13 and he received it 07/05/13

## 2013-07-30 LAB — BASIC METABOLIC PANEL
BUN: 13 mg/dL (ref 6–23)
CALCIUM: 9.2 mg/dL (ref 8.4–10.5)
CO2: 23 mEq/L (ref 19–32)
CREATININE: 1.01 mg/dL (ref 0.50–1.35)
Chloride: 106 mEq/L (ref 96–112)
GLUCOSE: 110 mg/dL — AB (ref 70–99)
Potassium: 4.6 mEq/L (ref 3.5–5.3)
SODIUM: 140 meq/L (ref 135–145)

## 2013-08-02 ENCOUNTER — Telehealth: Payer: Self-pay | Admitting: *Deleted

## 2013-08-02 NOTE — Telephone Encounter (Signed)
Patient is aware of lab results.

## 2013-08-02 NOTE — Telephone Encounter (Signed)
Message copied by Claudie Revering on Tue Aug 02, 2013  8:42 AM ------      Message from: JAFFE, ADAM R      Created: Mon Aug 01, 2013  7:11 AM       Blood work stable and unremarkable.      ----- Message -----         From: [facility] in [facility] Interface         Sent: 07/30/2013   1:25 AM           To: Dudley Major, DO                   ------

## 2013-08-04 ENCOUNTER — Telehealth: Payer: Self-pay | Admitting: Neurology

## 2013-08-04 NOTE — Telephone Encounter (Signed)
Pt called requesting to speak to a nurse regarding Dr. Jolaine Artist (he is not usre if that is the correct name). Regarding his appt tomorrow.

## 2013-08-04 NOTE — Telephone Encounter (Signed)
Left patient a message that his appt with Dr Valentina Shaggy is 10/12/13 at 1pm

## 2013-08-05 NOTE — Telephone Encounter (Signed)
Pt called again to speak to a nurse. He has questions and concerns he wants to address. He stated he will call later.

## 2013-08-05 NOTE — Telephone Encounter (Signed)
Pt wants to talk to you about appt [phone]

## 2013-08-05 NOTE — Telephone Encounter (Signed)
Patient is aware of appt with Dr Valentina Shaggy 10/12/13 at 1pm I made him aware that they will be sending a package with forms and directions soon it the mail

## 2013-08-24 ENCOUNTER — Ambulatory Visit: Payer: Medicare Other | Admitting: Neurology

## 2013-10-05 ENCOUNTER — Telehealth: Payer: Self-pay | Admitting: Neurology

## 2013-10-05 ENCOUNTER — Other Ambulatory Visit: Payer: Self-pay | Admitting: *Deleted

## 2013-10-05 MED ORDER — ACETAZOLAMIDE 250 MG PO TABS: 250.0000 mg | ORAL_TABLET | Freq: Two times a day (BID) | ORAL | Status: DC

## 2013-10-05 MED ORDER — ACETAZOLAMIDE 125 MG PO TABS: 250.0000 mg | ORAL_TABLET | Freq: Two times a day (BID) | ORAL | Status: DC

## 2013-10-05 NOTE — Telephone Encounter (Signed)
Needs refill on Diamox 250mg . Uses [facility] on [address] / [address]

## 2013-10-12 DIAGNOSIS — R413 Other amnesia: Secondary | ICD-10-CM

## 2013-10-25 ENCOUNTER — Telehealth: Payer: Self-pay | Admitting: *Deleted

## 2013-10-25 DIAGNOSIS — R413 Other amnesia: Secondary | ICD-10-CM

## 2013-10-26 NOTE — Telephone Encounter (Signed)
Error

## 2013-10-28 ENCOUNTER — Encounter: Payer: Self-pay | Admitting: Neurology

## 2013-10-28 ENCOUNTER — Ambulatory Visit (INDEPENDENT_AMBULATORY_CARE_PROVIDER_SITE_OTHER): Payer: Medicare Other | Admitting: Neurology

## 2013-10-28 VITALS — BP 130/86 | HR 60 | Resp 16 | Ht 70.0 in | Wt 196.0 lb

## 2013-10-28 DIAGNOSIS — G309 Alzheimer's disease, unspecified: Principal | ICD-10-CM

## 2013-10-28 DIAGNOSIS — Z8673 Personal history of transient ischemic attack (TIA), and cerebral infarction without residual deficits: Secondary | ICD-10-CM

## 2013-10-28 DIAGNOSIS — H532 Diplopia: Secondary | ICD-10-CM

## 2013-10-28 DIAGNOSIS — F0150 Vascular dementia without behavioral disturbance: Secondary | ICD-10-CM | POA: Insufficient documentation

## 2013-10-28 DIAGNOSIS — F0280 Dementia in other diseases classified elsewhere without behavioral disturbance: Secondary | ICD-10-CM | POA: Insufficient documentation

## 2013-10-28 DIAGNOSIS — R42 Dizziness and giddiness: Secondary | ICD-10-CM

## 2013-10-28 MED ORDER — DONEPEZIL HCL 5 MG PO TABS: 5.0000 mg | ORAL_TABLET | Freq: Every day | ORAL | Status: DC

## 2013-10-28 NOTE — Progress Notes (Signed)
NEUROLOGY FOLLOW UP OFFICE NOTE  [name] [name] [id number]  HISTORY OF PRESENT ILLNESS: [name] [name] is a 68 year old right-handed man with history of atrial fibrillation on AC, hypertension, hyperlipidemia and bipolar disorder who follows up for episodic diplopia and gait instability.  He is accompanied by his wife.  UPDATE: He is taking acetazolamide 250mg  daily.  His wife says he hasn't had a dizzy spell in 3 months and thinks it is due to the acetazolamide.  He had neuropsychological testing with Dr. Valentina Shaggy on 10/12/13.  Testing demonstrated deficits for processing speed on visual tasks requiring alternate attention, naming to confrontation, and non-dominant hand fine motor speed and problem-solving.  He exhibited poor delayed recall.  He did perform well in immediate attention span, working memory capacity, verbal fluency, visual-spatial organization, and bimanual grip strength.  Results showed deficits which may be consistent with mixed Alzheimer's and vascular disease.  HISTORY: He was admitted to [facility] on 03/17/13 with horizontal double vision and gait instability.  In the ED, his INR was 2.40.  CT of the brain was negative.  MRI of the brain revealed mild chronic small vessel disease but no acute infarct or bleed.  MRA of the head was unremarkable.  2D Echo revealed LVEF of 50-55% with no source of emboli.  Carotid dopplers revealed no hemodynamically significant stenosis.  Hgb A1c was 6.1 with mean plasma glucose of 128.  LDL was 34.  He reportedly has been experiencing intermittent episodes of horizontal diplopia and gait instability for at least 5 years.  It can occur any time of day.  When he walks, he will tip over either side.  There is no weakness or numbness in the feet.  There is no headache, nausea, tinnitus, hearing loss, vertigo or lightheadedness.  When he gets these attacks, he denies unilateral numbness, weakness, dysphagia or slurred speech.  It seems to be triggered by  stress.  It can last anywhere from 20 minutes to hours to a few days.  He has been admitted to the hospital on at least 4 different occasions and underwent a negative stroke workup.  He was formerly seen by Dr. Erling Cruz at [facility], who couldn't come up with an answer.  He saw Dr. Frederico Hamman at [facility] on 05/06/13.  Exam findings were unremarkable.  He suggested possible intermittent right INO.  He was initially started on acetazolamide 250mg  twice daily but this was discontinued as he likely does not have episodic ataxia.  However, his wife felt that it was helping.  Myasthenia panel was negative.  His wife also notes memory problems.  He misplaces objects.  For example, he lost his glasses.  He has become worse with directions when driving on familiar routes.  He has trouble with names of acquaintances.  He forgets names of known streets.  He manages the finances but his wife notes that there has been some late or missed payments of the bills.  He feels that his Bipolar is under control.  He denies family history of dementia.  He had a stroke in 1991, in which he presented with similar symptoms, such as diplopia and gait instability.  He also had left sided incoordination, which he still has some residual symptoms.  He was found to have atrial fibrillation and has been on warfarin since then.  He apparently does not always remember to take his medications correctly.  He takes Depakote and Lamictal for mood.  He takes Sinemet for restless leg.  No history of regular migraine but can recall on a couple of occasions having severe headache.  No family history of episodic gait problems.  PAST MEDICAL HISTORY: Past Medical History  Diagnosis Date  . HLD (hyperlipidemia)   . HTN (hypertension)   . Stroke 1991  . Atrial fibrillation   . Other and unspecified hyperlipidemia 03/20/2013    MEDICATIONS: Current Outpatient Prescriptions on File Prior to Visit  Medication Sig Dispense  Refill  . acetaZOLAMIDE (DIAMOX) 250 MG tablet Take 1 tablet (250 mg total) by mouth 2 (two) times daily.  180 tablet  0  . carbidopa-levodopa (SINEMET IR) 25-250 MG per tablet Take 1 tablet by mouth 2 (two) times daily.      . divalproex (DEPAKOTE) 250 MG DR tablet Take 250 mg by mouth 2 (two) times daily.      . fish oil-omega-3 fatty acids 1000 MG capsule Take 1 g by mouth daily.       . folic acid (FOLVITE) 1 MG tablet Take 1 mg by mouth daily.      Marland Kitchen lamoTRIgine (LAMICTAL) 150 MG tablet Take 150 mg by mouth 2 (two) times daily.      Marland Kitchen lisinopril (PRINIVIL,ZESTRIL) 10 MG tablet Take 10 mg by mouth daily.      Marland Kitchen warfarin (COUMADIN) 5 MG tablet Take 7.5-10 mg by mouth daily. Take 7.5 mg on Tuesday, Thursday, and Saturday. All other days take 10 mg       No current facility-administered medications on file prior to visit.    ALLERGIES: No Known Allergies  FAMILY HISTORY: Family History  Problem Relation Age of Onset  . Suicidality    . Cancer    . Alcohol abuse      SOCIAL HISTORY: History   Social History  . Marital Status: Married    Spouse Name: N/A    Number of Children: 3  . Years of Education: N/A   Occupational History  . retired    Social History Main Topics  . Smoking status: Never Smoker   . Smokeless tobacco: Not on file  . Alcohol Use: No     Comment: occasional  . Drug Use: Not on file  . Sexual Activity: Not on file   Other Topics Concern  . Not on file   Social History Narrative  . No narrative on file    REVIEW OF SYSTEMS: Constitutional: No fevers, chills, or sweats, no generalized fatigue, change in appetite Eyes: No visual changes, double vision, eye pain Ear, nose and throat: No hearing loss, ear pain, nasal congestion, sore throat Cardiovascular: No chest pain, palpitations Respiratory:  No shortness of breath at rest or with exertion, wheezes GastrointestinaI: No nausea, vomiting, diarrhea, abdominal pain, fecal  incontinence Genitourinary:  No dysuria, urinary retention or frequency Musculoskeletal:  No neck pain, back pain Integumentary: No rash, pruritus, skin lesions Neurological: as above Psychiatric: No depression, insomnia, anxiety Endocrine: No palpitations, fatigue, diaphoresis, mood swings, change in appetite, change in weight, increased thirst Hematologic/Lymphatic:  No anemia, purpura, petechiae. Allergic/Immunologic: no itchy/runny eyes, nasal congestion, recent allergic reactions, rashes  PHYSICAL EXAM: Filed Vitals:   10/28/13 1523  BP: 130/86  Pulse: 60  Resp: 16   General: No acute distress Head:  Normocephalic/atraumatic Neck: supple, no paraspinal tenderness, full range of motion Heart:  Regular rate and rhythm Lungs:  Clear to auscultation bilaterally Back: No paraspinal tenderness Neurological Exam: alert and oriented to person, place, and time. Attention span and concentration intact, some impairment with  delayed recall and remote memory intact, fund of knowledge intact.  Speech fluent and not dysarthric, language intact.  CN II-XII intact. Fundoscopic exam unremarkable without vessel changes, exudates, hemorrhages or papilledema.  Bulk and tone normal, muscle strength 5/5 throughout.  Sensation to light touch, temperature and vibration intact.  DTR 3+ in LUE and left patellar, otherwise 2+.  Finger to nose with dysmetria on the left.  Gait steady, Romberg negative.  IMPRESSION: 1.  Mixed Alzheimer's and vascular dementia 2.  Transient episodes of diplopia and gait instability, unclear etiology  PLAN: 1.  Will initiate Aricept 5mg  at bedtime.  Will increase to 10mg  at bedtime in 4 weeks if tolerating.  Side effects discussed. 2.  I'm not convinced that the acetazolamide is helping suppress the transient diplopia and gait instability.  We will discontinue the acetazolamide for at least 4 weeks to see how he is. 3.  Based on results in the neuropsychological testing, I do  not think he should drive until he gets a formal driving evaluation.  We will refer him. 4.  Follow up in 3 months.  Metta Clines, DO  CC:  Deland Pretty, MD

## 2013-10-28 NOTE — Patient Instructions (Addendum)
I suspect that there is an underlying dementia related to Alzheimer's disease and the stroke. 1.  To help with memory, we will start donepezil (Aricept) 5mg  daily for four weeks.  If you are tolerating the medication, then after four weeks, we will increase the dose to 10mg  daily.  Side effects include nausea, vomiting, diarrhea, vivid dreams, and muscle cramps.  Please call the clinic if you experience any of these symptoms. 2.  Stop the acetazolamide. 3.  Stay active (excercise, play puzzles, read, be social) 4.  I recommend no driving until you have a formal driving evaluation.  You can contact [employer] in [place]. [phone]. 5.  Call in 4 weeks with update.  Follow up in 3 months.

## 2013-10-31 NOTE — Progress Notes (Signed)
Note faxed.

## 2013-11-23 ENCOUNTER — Telehealth: Payer: Self-pay | Admitting: Neurology

## 2013-11-23 NOTE — Telephone Encounter (Signed)
Calling with an update / Cb# [phone] [address]

## 2013-11-24 ENCOUNTER — Telehealth: Payer: Self-pay | Admitting: *Deleted

## 2013-11-24 NOTE — Telephone Encounter (Signed)
Left message for patient to return call.

## 2013-11-24 NOTE — Telephone Encounter (Signed)
Called patient and left message to return  Call to office

## 2013-11-25 ENCOUNTER — Telehealth: Payer: Self-pay | Admitting: Neurology

## 2013-11-25 NOTE — Telephone Encounter (Signed)
Pt wife mary Stilley [phone] call back in the afternoon after 1:00 please call her back

## 2013-11-28 NOTE — Telephone Encounter (Signed)
I have returned call x2 not able to  [employer]

## 2013-12-01 ENCOUNTER — Telehealth: Payer: Self-pay | Admitting: Neurology

## 2013-12-01 NOTE — Telephone Encounter (Signed)
[name] [name], Pt's wife called f/u on the conversation she had with you yesterday. Please call pt's wife # [phone]

## 2013-12-01 NOTE — Telephone Encounter (Signed)
I spoke with patient's wife is wanting him to go back on the Acetazolamide  She says this stops the double vision and dizziness . I advised her if there is another episode she should take him to the ED

## 2013-12-01 NOTE — Telephone Encounter (Signed)
I don't think the acetazolamide is indicated.  At this point, I don't have an explanation for his transient double vision.

## 2013-12-01 NOTE — Telephone Encounter (Signed)
Patients wife is calling stating patient has had a episode of double vision she is wanting to know could it be because he stopped the acetazolamide or what do you think he should do they are going out of town for 2 weeks please advise

## 2013-12-26 ENCOUNTER — Other Ambulatory Visit: Payer: Self-pay | Admitting: Neurology

## 2014-01-04 ENCOUNTER — Telehealth: Payer: Self-pay | Admitting: Neurology

## 2014-01-04 NOTE — Telephone Encounter (Signed)
[name], pt's spouse called requesting to speak to a nurse regarding aricept. C/b [phone]

## 2014-01-05 ENCOUNTER — Telehealth: Payer: Self-pay | Admitting: *Deleted

## 2014-01-10 NOTE — Telephone Encounter (Signed)
Left Message - Left message with [name] to call office    By Jodell Cipro Der Ruthell Rummage, LPN

## 2014-01-17 ENCOUNTER — Telehealth: Payer: Self-pay | Admitting: *Deleted

## 2014-01-17 NOTE — Telephone Encounter (Signed)
Patient is aware he should be taking Aricept

## 2014-01-30 ENCOUNTER — Ambulatory Visit (INDEPENDENT_AMBULATORY_CARE_PROVIDER_SITE_OTHER): Payer: Medicare Other | Admitting: Neurology

## 2014-01-30 ENCOUNTER — Encounter: Payer: Self-pay | Admitting: Neurology

## 2014-01-30 VITALS — BP 140/80 | HR 65 | Ht 70.5 in | Wt 199.6 lb

## 2014-01-30 DIAGNOSIS — F0280 Dementia in other diseases classified elsewhere without behavioral disturbance: Secondary | ICD-10-CM

## 2014-01-30 DIAGNOSIS — F0150 Vascular dementia without behavioral disturbance: Secondary | ICD-10-CM

## 2014-01-30 DIAGNOSIS — I639 Cerebral infarction, unspecified: Secondary | ICD-10-CM

## 2014-01-30 DIAGNOSIS — G309 Alzheimer's disease, unspecified: Secondary | ICD-10-CM

## 2014-01-30 NOTE — Patient Instructions (Signed)
1.  Continue Aricept 10mg  daily 2.  Start regular exercise at the gym 3.  Start playing word search games and brain teasers 4.  Read 5.  Remain social and active 6.  Please review information regarding Alzheimer's support groups 7.  Follow up in 6 months.

## 2014-01-30 NOTE — Progress Notes (Signed)
NEUROLOGY FOLLOW UP OFFICE NOTE  [name] [name] [id number]  HISTORY OF PRESENT ILLNESS: [name] [name] is a 68 year old right-handed man with history of atrial fibrillation on AC, hypertension, hyperlipidemia and bipolar disorder who follows up for episodic diplopia and gait instability.  He is accompanied by his wife.  UPDATE: Acetazolamide was discontinued.  He was started on Aricept.  He has been tolerating it.  He performs all of his ADLs.  His wife says he will repeat questions at times or misplace objections occasionally, such as his wallet.  Denies recent dizzy spells.  HISTORY: He was admitted to [facility] on 03/17/13 with horizontal double vision and gait instability.  In the ED, his INR was 2.40.  CT of the brain was negative.  MRI of the brain revealed mild chronic small vessel disease with remote right midbrain infarct, but no acute infarct or bleed.  MRA of the head was unremarkable.  2D Echo revealed LVEF of 50-55% with no source of emboli.  Carotid dopplers revealed no hemodynamically significant stenosis.  Hgb A1c was 6.1 with mean plasma glucose of 128.  LDL was 34.  He reportedly has been experiencing intermittent episodes of horizontal diplopia and gait instability for at least 5 years.  It can occur any time of day.  When he walks, he will tip over either side.  There is no weakness or numbness in the feet.  There is no headache, nausea, tinnitus, hearing loss, vertigo or lightheadedness.  When he gets these attacks, he denies unilateral numbness, weakness, dysphagia or slurred speech.  It seems to be triggered by stress.  It can last anywhere from 20 minutes to hours to a few days.  He has been admitted to the hospital on at least 4 different occasions and underwent a negative stroke workup.  He was formerly seen by Dr. Erling Cruz at [facility], who couldn't come up with an answer.  He saw Dr. Frederico Hamman at [facility] on 05/06/13.  Exam findings were unremarkable.   He suggested possible intermittent right INO.  He was initially started on acetazolamide 250mg  twice daily but this was discontinued as he likely does not have episodic ataxia.  However, his wife felt that it was helping.  Myasthenia panel was negative.  His wife also notes memory problems.  He misplaces objects.  For example, he lost his glasses.  He has become worse with directions when driving on familiar routes.  He has trouble with names of acquaintances.  He forgets names of known streets.  He manages the finances but his wife notes that there has been some late or missed payments of the bills.  He feels that his Bipolar is under control.  He denies family history of dementia.  He had neuropsychological testing with Dr. Valentina Shaggy on 10/12/13.  Testing demonstrated deficits for processing speed on visual tasks requiring alternate attention, naming to confrontation, and non-dominant hand fine motor speed and problem-solving.  He exhibited poor delayed recall.  He did perform well in immediate attention span, working memory capacity, verbal fluency, visual-spatial organization, and bimanual grip strength.  Results showed deficits which may be consistent with mixed Alzheimer's and vascular disease.  He had a right midbrain stroke in 1991, in which he presented with similar symptoms, such as diplopia and gait instability.  He also had left sided incoordination, which he still has some residual symptoms.  He was found to have atrial fibrillation and has been on warfarin since then.  He apparently  does not always remember to take his medications correctly.  He takes Depakote and Lamictal for mood.  He takes Sinemet for restless leg.  No history of regular migraine but can recall on a couple of occasions having severe headache.  No family history of episodic gait problems.  PAST MEDICAL HISTORY: Past Medical History  Diagnosis Date  . HLD (hyperlipidemia)   . HTN (hypertension)   . Stroke 1991  . Atrial  fibrillation   . Other and unspecified hyperlipidemia 03/20/2013    MEDICATIONS: Current Outpatient Prescriptions on File Prior to Visit  Medication Sig Dispense Refill  . carbidopa-levodopa (SINEMET IR) 25-250 MG per tablet Take 1 tablet by mouth 2 (two) times daily.    . divalproex (DEPAKOTE) 250 MG DR tablet Take 250 mg by mouth 2 (two) times daily.    . fish oil-omega-3 fatty acids 1000 MG capsule Take 1 g by mouth daily.     . folic acid (FOLVITE) 1 MG tablet Take 1 mg by mouth daily.    Marland Kitchen lamoTRIgine (LAMICTAL) 150 MG tablet Take 150 mg by mouth 2 (two) times daily.    Marland Kitchen lisinopril (PRINIVIL,ZESTRIL) 10 MG tablet Take 10 mg by mouth daily.    . STUDY MEDICATION Cholesterol medicine    . warfarin (COUMADIN) 5 MG tablet Take 7.5-10 mg by mouth daily. Take 7.5 mg on Tuesday, Thursday, and Saturday. All other days take 10 mg     No current facility-administered medications on file prior to visit.    ALLERGIES: No Known Allergies  FAMILY HISTORY: Family History  Problem Relation Age of Onset  . Suicidality    . Cancer    . Alcohol abuse      SOCIAL HISTORY: History   Social History  . Marital Status: Married    Spouse Name: N/A    Number of Children: 3  . Years of Education: N/A   Occupational History  . retired    Social History Main Topics  . Smoking status: Never Smoker   . Smokeless tobacco: Not on file  . Alcohol Use: No     Comment: occasional  . Drug Use: Not on file  . Sexual Activity: Not on file   Other Topics Concern  . Not on file   Social History Narrative    REVIEW OF SYSTEMS: Constitutional: No fevers, chills, or sweats, no generalized fatigue, change in appetite Eyes: No visual changes, double vision, eye pain Ear, nose and throat: No hearing loss, ear pain, nasal congestion, sore throat Cardiovascular: No chest pain, palpitations Respiratory:  No shortness of breath at rest or with exertion, wheezes GastrointestinaI: No nausea, vomiting,  diarrhea, abdominal pain, fecal incontinence Genitourinary:  No dysuria, urinary retention or frequency Musculoskeletal:  No neck pain, back pain Integumentary: No rash, pruritus, skin lesions Neurological: as above Psychiatric: No depression, insomnia, anxiety Endocrine: No palpitations, fatigue, diaphoresis, mood swings, change in appetite, change in weight, increased thirst Hematologic/Lymphatic:  No anemia, purpura, petechiae. Allergic/Immunologic: no itchy/runny eyes, nasal congestion, recent allergic reactions, rashes  PHYSICAL EXAM: Filed Vitals:   01/30/14 1517  BP: 140/80  Pulse: 65  Weight              199 lbs  General: No acute distress Head:  Normocephalic/atraumatic Eyes:  Fundoscopic exam unremarkable without vessel changes, exudates, hemorrhages or papilledema. Neck: supple, no paraspinal tenderness, full range of motion Heart:  Regular rate and rhythm Lungs:  Clear to auscultation bilaterally Back: No paraspinal tenderness Neurological Exam: alert and  oriented to person, place, and time. Attention span and concentration intact, recent and remote memory intact, fund of knowledge intact.  Speech fluent and not dysarthric, language intact.   MMSE - Mini Mental State Exam 01/30/2014  Orientation to time 5  Orientation to Place 5  Registration 3  Attention/ Calculation 3  Recall 0  Language- name 2 objects 2  Language- repeat 1  Language- follow 3 step command 3  Language- read & follow direction 1  Write a sentence 1  Copy design 1  Total score 25   CN II-XII intact. Fundoscopic exam unremarkable without vessel changes, exudates, hemorrhages or papilledema.  Bulk and tone normal, muscle strength 5/5 throughout.  Sensation to light touch, temperature and vibration intact.  Deep tendon reflexes 2+ throughout, toes downgoing.  Finger to nose and heel to shin testing reveal left sided dysmetria.  Gait a little unsteady. Romberg with mild sway.  IMPRESSION: Mixed  Alzheimer's and vascular dementia, early stages History of stroke  PLAN: Aricept 10mg  daily Encourage exercise, brain teasers, reading, social activity Anticoagulation for secondary stroke prevention Information provided regarding Alzheimer's support groups. Follow up in 6 months.  15 minutes spent with patient, over 50% spent discussing diagnosis and coordinating care  Metta Clines, DO  CC: Deland Pretty, MD

## 2014-03-09 DIAGNOSIS — Z7901 Long term (current) use of anticoagulants: Secondary | ICD-10-CM | POA: Diagnosis not present

## 2014-03-09 DIAGNOSIS — I4891 Unspecified atrial fibrillation: Secondary | ICD-10-CM | POA: Diagnosis not present

## 2014-04-10 DIAGNOSIS — I4891 Unspecified atrial fibrillation: Secondary | ICD-10-CM | POA: Diagnosis not present

## 2014-04-10 DIAGNOSIS — Z7901 Long term (current) use of anticoagulants: Secondary | ICD-10-CM | POA: Diagnosis not present

## 2014-04-27 DIAGNOSIS — Z7901 Long term (current) use of anticoagulants: Secondary | ICD-10-CM | POA: Diagnosis not present

## 2014-04-27 DIAGNOSIS — I4891 Unspecified atrial fibrillation: Secondary | ICD-10-CM | POA: Diagnosis not present

## 2014-05-18 DIAGNOSIS — Z7901 Long term (current) use of anticoagulants: Secondary | ICD-10-CM | POA: Diagnosis not present

## 2014-05-18 DIAGNOSIS — I4891 Unspecified atrial fibrillation: Secondary | ICD-10-CM | POA: Diagnosis not present

## 2014-06-08 DIAGNOSIS — Z7901 Long term (current) use of anticoagulants: Secondary | ICD-10-CM | POA: Diagnosis not present

## 2014-06-08 DIAGNOSIS — I4891 Unspecified atrial fibrillation: Secondary | ICD-10-CM | POA: Diagnosis not present

## 2014-06-22 DIAGNOSIS — Z7901 Long term (current) use of anticoagulants: Secondary | ICD-10-CM | POA: Diagnosis not present

## 2014-06-22 DIAGNOSIS — I4891 Unspecified atrial fibrillation: Secondary | ICD-10-CM | POA: Diagnosis not present

## 2014-08-01 ENCOUNTER — Ambulatory Visit: Payer: Medicare Other | Admitting: Neurology

## 2014-08-08 ENCOUNTER — Telehealth: Payer: Self-pay | Admitting: Neurology

## 2014-08-08 ENCOUNTER — Other Ambulatory Visit: Payer: Self-pay | Admitting: *Deleted

## 2014-08-08 MED ORDER — DONEPEZIL HCL 10 MG PO TABS: 10.0000 mg | ORAL_TABLET | Freq: Every day | ORAL | Status: DC

## 2014-08-08 NOTE — Telephone Encounter (Signed)
Rx sent 

## 2014-08-08 NOTE — Telephone Encounter (Signed)
Pt called for Aricept refill, [facility] on [address]. Wants 90 day supply w/ 3-4 refills / [address]

## 2014-08-11 ENCOUNTER — Ambulatory Visit (INDEPENDENT_AMBULATORY_CARE_PROVIDER_SITE_OTHER): Payer: Medicare Other | Admitting: Neurology

## 2014-08-11 ENCOUNTER — Encounter: Payer: Self-pay | Admitting: Neurology

## 2014-08-11 VITALS — BP 127/70 | HR 74 | Ht 70.0 in | Wt 188.0 lb

## 2014-08-11 DIAGNOSIS — F0150 Vascular dementia without behavioral disturbance: Secondary | ICD-10-CM

## 2014-08-11 DIAGNOSIS — G309 Alzheimer's disease, unspecified: Secondary | ICD-10-CM

## 2014-08-11 DIAGNOSIS — R2681 Unsteadiness on feet: Secondary | ICD-10-CM

## 2014-08-11 DIAGNOSIS — F0280 Dementia in other diseases classified elsewhere without behavioral disturbance: Secondary | ICD-10-CM

## 2014-08-11 DIAGNOSIS — Z8673 Personal history of transient ischemic attack (TIA), and cerebral infarction without residual deficits: Secondary | ICD-10-CM

## 2014-08-11 NOTE — Patient Instructions (Signed)
Continue Aricept 10mg  daily We will provide you info on Alzheimer's support groups Follow up in 6 months

## 2014-08-11 NOTE — Progress Notes (Signed)
NEUROLOGY FOLLOW UP OFFICE NOTE  [name] [name] [id number]  HISTORY OF PRESENT ILLNESS: [name] [name] is a 69 year old right-handed man with atrial fibrillation on AC, hypertension, hyperlipidemia and bipolar disorder who follows up for mixed vascular and Alzheimer's dementia, episodic diplopia and gait instability.  He is accompanied by his wife and daughter who provide some history.  Recent note from Dr. Shelia Media reviewed.  UPDATE: Acetazolamide was discontinued.  He was started on Aricept.  He has been tolerating it.  He performs all of his ADLs.  His wife says he will repeat questions at times or misplace objections occasionally, such as his wallet.  Now, his wife thinks his memory is a little better since starting the Aricept.  Denies recent dizzy spells or diplopia.  Regarding memory, everything has been stable.  He still repeats questions often, but   He noted some mild tremor a little while ago, which hasn't bothered him recently.  HISTORY: He was admitted to [facility] on 03/17/13 with horizontal double vision and gait instability.  In the ED, his INR was 2.40.  CT of the brain was negative.  MRI of the brain revealed mild chronic small vessel disease with remote right midbrain infarct, but no acute infarct or bleed.  MRA of the head was unremarkable.  2D Echo revealed LVEF of 50-55% with no source of emboli.  Carotid dopplers revealed no hemodynamically significant stenosis.  Hgb A1c was 6.1 with mean plasma glucose of 128.  LDL was 34.  He reportedly has been experiencing intermittent episodes of horizontal diplopia and gait instability for at least 5 years.  It can occur any time of day.  When he walks, he will tip over either side.  There is no weakness or numbness in the feet.  There is no headache, nausea, tinnitus, hearing loss, vertigo or lightheadedness.  When he gets these attacks, he denies unilateral numbness, weakness, dysphagia or slurred speech.  It seems to be triggered by  stress.  It can last anywhere from 20 minutes to hours to a few days.  He has been admitted to the hospital on at least 4 different occasions and underwent a negative stroke workup.  He was formerly seen by Dr. Erling Cruz at [facility], who couldn't come up with an answer.  He saw Dr. Frederico Hamman at [facility] on 05/06/13.  Exam findings were unremarkable.  He suggested possible intermittent right INO.  He was initially started on acetazolamide 250mg  twice daily but this was discontinued as he likely does not have episodic ataxia.  Myasthenia panel was negative.  His wife also notes memory problems.  He misplaces objects.  For example, he lost his glasses.  He has become worse with directions when driving on familiar routes.  He has trouble with names of acquaintances.  He forgets names of known streets.  He manages the finances but his wife notes that there has been some late or missed payments of the bills.  He feels that his Bipolar is under control.  He denies family history of dementia.  He had neuropsychological testing with Dr. Valentina Shaggy on 10/12/13.  Testing demonstrated deficits for processing speed on visual tasks requiring alternate attention, naming to confrontation, and non-dominant hand fine motor speed and problem-solving.  He exhibited poor delayed recall.  He did perform well in immediate attention span, working memory capacity, verbal fluency, visual-spatial organization, and bimanual grip strength.  Results showed deficits which may be consistent with mixed Alzheimer's and vascular disease.  He had a right midbrain stroke in 1991, in which he presented with similar symptoms, such as diplopia and gait instability.  He also had left sided incoordination, which he still has some residual symptoms.  He was found to have atrial fibrillation and has been on warfarin since then.  He apparently does not always remember to take his medications correctly.  He takes Depakote and Lamictal  for mood.  He takes Sinemet for restless leg.  No history of regular migraine but can recall on a couple of occasions having severe headache.  No family history of episodic gait problems.  PAST MEDICAL HISTORY: Past Medical History  Diagnosis Date  . HLD (hyperlipidemia)   . HTN (hypertension)   . Stroke 1991  . Atrial fibrillation   . Other and unspecified hyperlipidemia 03/20/2013    MEDICATIONS: Current Outpatient Prescriptions on File Prior to Visit  Medication Sig Dispense Refill  . carbidopa-levodopa (SINEMET IR) 25-250 MG per tablet Take 1 tablet by mouth 2 (two) times daily.    . divalproex (DEPAKOTE) 250 MG DR tablet Take 250 mg by mouth 2 (two) times daily.    Marland Kitchen donepezil (ARICEPT) 10 MG tablet Take 1 tablet (10 mg total) by mouth at bedtime. 90 tablet 2  . fish oil-omega-3 fatty acids 1000 MG capsule Take 1 g by mouth daily.     . folic acid (FOLVITE) 1 MG tablet Take 1 mg by mouth daily.    Marland Kitchen lamoTRIgine (LAMICTAL) 150 MG tablet Take 150 mg by mouth 2 (two) times daily.    Marland Kitchen lisinopril (PRINIVIL,ZESTRIL) 10 MG tablet Take 10 mg by mouth daily.    . STUDY MEDICATION Cholesterol medicine    . warfarin (COUMADIN) 5 MG tablet Take 7.5-10 mg by mouth daily. Take 7.5 mg on Tuesday, Thursday, and Saturday. All other days take 10 mg     No current facility-administered medications on file prior to visit.    ALLERGIES: No Known Allergies  FAMILY HISTORY: Family History  Problem Relation Age of Onset  . Suicidality    . Cancer    . Alcohol abuse      SOCIAL HISTORY: History   Social History  . Marital Status: Married    Spouse Name: N/A  . Number of Children: 3  . Years of Education: N/A   Occupational History  . retired    Social History Main Topics  . Smoking status: Never Smoker   . Smokeless tobacco: Not on file  . Alcohol Use: No     Comment: occasional  . Drug Use: Not on file  . Sexual Activity: Not on file   Other Topics Concern  . Not on file    Social History Narrative    REVIEW OF SYSTEMS: Constitutional: No fevers, chills, or sweats, no generalized fatigue, change in appetite Eyes: No visual changes, double vision, eye pain Ear, nose and throat: No hearing loss, ear pain, nasal congestion, sore throat Cardiovascular: No chest pain, palpitations Respiratory:  No shortness of breath at rest or with exertion, wheezes GastrointestinaI: No nausea, vomiting, diarrhea, abdominal pain, fecal incontinence Genitourinary:  No dysuria, urinary retention or frequency Musculoskeletal:  No neck pain, back pain Integumentary: No rash, pruritus, skin lesions Neurological: as above Psychiatric: No depression, insomnia, anxiety Endocrine: No palpitations, fatigue, diaphoresis, mood swings, change in appetite, change in weight, increased thirst Hematologic/Lymphatic:  No anemia, purpura, petechiae. Allergic/Immunologic: no itchy/runny eyes, nasal congestion, recent allergic reactions, rashes  PHYSICAL EXAM: Filed Vitals:   08/11/14 1426  BP: 127/70  Pulse: 74   General: No acute distress Head:  Normocephalic/atraumatic Eyes:  Fundoscopic exam unremarkable without vessel changes, exudates, hemorrhages or papilledema. Neck: supple, no paraspinal tenderness, full range of motion Heart:  Regular rate and rhythm Lungs:  Clear to auscultation bilaterally Back: No paraspinal tenderness Neurological Exam: alert and oriented to person, place, and time. Attention span and concentration intact, recent and remote memory intact, fund of knowledge intact.  Speech fluent and not dysarthric, language intact. CN II-XII intact. Fundoscopic exam unremarkable without vessel changes, exudates, hemorrhages or papilledema.  Bulk and tone normal, muscle strength 5/5 throughout.  Sensation to light touch, temperature and vibration intact.  Deep tendon reflexes 3+ left patellar, otherwise 2+ throughout, toes downgoing.  Finger to nose testing revealed left sided  dysmetria.  Gait a little unsteady. Romberg with mild sway. .    IMPRESSION: Mixed Alzheimer's and vascular dementia, early stages Gait instability Tremor, not appreciated on exam History of stroke  PLAN: Aricept 10mg  daily Anticoagulation for secondary stroke prevention He is currently in a trial for a cholesterol lowering drug.  Lipid panel scheduled to be checked by PCP. LDL goal should be less than 100. Continue blood pressure control Monitor tremor  26 minutes spent face to face with patient, over 50% spent discussing management.  Metta Clines, DO  CC:  Albesa Seen, MD

## 2014-08-15 DIAGNOSIS — G4733 Obstructive sleep apnea (adult) (pediatric): Secondary | ICD-10-CM | POA: Diagnosis not present

## 2014-08-17 DIAGNOSIS — Z7901 Long term (current) use of anticoagulants: Secondary | ICD-10-CM | POA: Diagnosis not present

## 2014-08-17 DIAGNOSIS — I4891 Unspecified atrial fibrillation: Secondary | ICD-10-CM | POA: Diagnosis not present

## 2014-08-29 ENCOUNTER — Encounter: Payer: Self-pay | Admitting: *Deleted

## 2014-08-31 DIAGNOSIS — Z7901 Long term (current) use of anticoagulants: Secondary | ICD-10-CM | POA: Diagnosis not present

## 2014-08-31 DIAGNOSIS — I4891 Unspecified atrial fibrillation: Secondary | ICD-10-CM | POA: Diagnosis not present

## 2014-09-13 DIAGNOSIS — M1712 Unilateral primary osteoarthritis, left knee: Secondary | ICD-10-CM | POA: Diagnosis not present

## 2014-09-13 DIAGNOSIS — M170 Bilateral primary osteoarthritis of knee: Secondary | ICD-10-CM | POA: Diagnosis not present

## 2014-09-13 DIAGNOSIS — M1711 Unilateral primary osteoarthritis, right knee: Secondary | ICD-10-CM | POA: Diagnosis not present

## 2014-09-19 DIAGNOSIS — I4891 Unspecified atrial fibrillation: Secondary | ICD-10-CM | POA: Diagnosis not present

## 2014-09-19 DIAGNOSIS — Z7901 Long term (current) use of anticoagulants: Secondary | ICD-10-CM | POA: Diagnosis not present

## 2014-09-21 ENCOUNTER — Encounter: Payer: Self-pay | Admitting: Cardiovascular Disease

## 2014-10-03 DIAGNOSIS — I4891 Unspecified atrial fibrillation: Secondary | ICD-10-CM | POA: Diagnosis not present

## 2014-10-03 DIAGNOSIS — Z7901 Long term (current) use of anticoagulants: Secondary | ICD-10-CM | POA: Diagnosis not present

## 2014-10-24 DIAGNOSIS — I4891 Unspecified atrial fibrillation: Secondary | ICD-10-CM | POA: Diagnosis not present

## 2014-10-24 DIAGNOSIS — Z7901 Long term (current) use of anticoagulants: Secondary | ICD-10-CM | POA: Diagnosis not present

## 2014-10-31 DIAGNOSIS — Z7901 Long term (current) use of anticoagulants: Secondary | ICD-10-CM | POA: Diagnosis not present

## 2014-10-31 DIAGNOSIS — I4891 Unspecified atrial fibrillation: Secondary | ICD-10-CM | POA: Diagnosis not present

## 2014-11-16 DIAGNOSIS — I4891 Unspecified atrial fibrillation: Secondary | ICD-10-CM | POA: Diagnosis not present

## 2014-11-16 DIAGNOSIS — Z7901 Long term (current) use of anticoagulants: Secondary | ICD-10-CM | POA: Diagnosis not present

## 2014-12-14 DIAGNOSIS — I4891 Unspecified atrial fibrillation: Secondary | ICD-10-CM | POA: Diagnosis not present

## 2014-12-14 DIAGNOSIS — Z7901 Long term (current) use of anticoagulants: Secondary | ICD-10-CM | POA: Diagnosis not present

## 2015-01-02 DIAGNOSIS — I4891 Unspecified atrial fibrillation: Secondary | ICD-10-CM | POA: Diagnosis not present

## 2015-01-02 DIAGNOSIS — Z7901 Long term (current) use of anticoagulants: Secondary | ICD-10-CM | POA: Diagnosis not present

## 2015-01-05 DIAGNOSIS — L570 Actinic keratosis: Secondary | ICD-10-CM | POA: Diagnosis not present

## 2015-01-05 DIAGNOSIS — L718 Other rosacea: Secondary | ICD-10-CM | POA: Diagnosis not present

## 2015-01-11 DIAGNOSIS — Z23 Encounter for immunization: Secondary | ICD-10-CM | POA: Diagnosis not present

## 2015-01-23 DIAGNOSIS — E7800 Pure hypercholesterolemia, unspecified: Secondary | ICD-10-CM | POA: Diagnosis not present

## 2015-01-29 DIAGNOSIS — E7800 Pure hypercholesterolemia, unspecified: Secondary | ICD-10-CM | POA: Diagnosis not present

## 2015-01-29 DIAGNOSIS — I10 Essential (primary) hypertension: Secondary | ICD-10-CM | POA: Diagnosis not present

## 2015-01-29 DIAGNOSIS — I4891 Unspecified atrial fibrillation: Secondary | ICD-10-CM | POA: Diagnosis not present

## 2015-01-29 DIAGNOSIS — K644 Residual hemorrhoidal skin tags: Secondary | ICD-10-CM | POA: Diagnosis not present

## 2015-01-29 DIAGNOSIS — Z7901 Long term (current) use of anticoagulants: Secondary | ICD-10-CM | POA: Diagnosis not present

## 2015-01-30 DIAGNOSIS — Z7901 Long term (current) use of anticoagulants: Secondary | ICD-10-CM | POA: Diagnosis not present

## 2015-01-30 DIAGNOSIS — I4891 Unspecified atrial fibrillation: Secondary | ICD-10-CM | POA: Diagnosis not present

## 2015-02-13 ENCOUNTER — Ambulatory Visit: Payer: Medicare Other | Admitting: Neurology

## 2015-03-01 DIAGNOSIS — Z7901 Long term (current) use of anticoagulants: Secondary | ICD-10-CM | POA: Diagnosis not present

## 2015-03-01 DIAGNOSIS — I4891 Unspecified atrial fibrillation: Secondary | ICD-10-CM | POA: Diagnosis not present

## 2015-03-08 DIAGNOSIS — J069 Acute upper respiratory infection, unspecified: Secondary | ICD-10-CM | POA: Diagnosis not present

## 2015-03-08 DIAGNOSIS — K644 Residual hemorrhoidal skin tags: Secondary | ICD-10-CM | POA: Diagnosis not present

## 2015-05-29 DIAGNOSIS — Z7901 Long term (current) use of anticoagulants: Secondary | ICD-10-CM | POA: Diagnosis not present

## 2015-05-29 DIAGNOSIS — I4891 Unspecified atrial fibrillation: Secondary | ICD-10-CM | POA: Diagnosis not present

## 2015-06-07 ENCOUNTER — Ambulatory Visit: Payer: Medicare Other | Admitting: Neurology

## 2015-06-07 DIAGNOSIS — Z029 Encounter for administrative examinations, unspecified: Secondary | ICD-10-CM

## 2015-06-12 DIAGNOSIS — Z7901 Long term (current) use of anticoagulants: Secondary | ICD-10-CM | POA: Diagnosis not present

## 2015-06-12 DIAGNOSIS — I340 Nonrheumatic mitral (valve) insufficiency: Secondary | ICD-10-CM | POA: Diagnosis not present

## 2015-06-12 DIAGNOSIS — I6523 Occlusion and stenosis of bilateral carotid arteries: Secondary | ICD-10-CM | POA: Diagnosis not present

## 2015-06-12 DIAGNOSIS — R0689 Other abnormalities of breathing: Secondary | ICD-10-CM | POA: Diagnosis not present

## 2015-06-12 DIAGNOSIS — I4891 Unspecified atrial fibrillation: Secondary | ICD-10-CM | POA: Diagnosis not present

## 2015-06-12 DIAGNOSIS — I679 Cerebrovascular disease, unspecified: Secondary | ICD-10-CM | POA: Diagnosis not present

## 2015-06-12 DIAGNOSIS — T45511A Poisoning by anticoagulants, accidental (unintentional), initial encounter: Secondary | ICD-10-CM | POA: Diagnosis not present

## 2015-06-12 DIAGNOSIS — R270 Ataxia, unspecified: Secondary | ICD-10-CM | POA: Diagnosis not present

## 2015-06-12 DIAGNOSIS — E512 Wernicke's encephalopathy: Secondary | ICD-10-CM | POA: Diagnosis not present

## 2015-06-12 DIAGNOSIS — K7290 Hepatic failure, unspecified without coma: Secondary | ICD-10-CM | POA: Diagnosis not present

## 2015-06-12 DIAGNOSIS — I10 Essential (primary) hypertension: Secondary | ICD-10-CM | POA: Diagnosis not present

## 2015-06-12 DIAGNOSIS — R791 Abnormal coagulation profile: Secondary | ICD-10-CM | POA: Diagnosis not present

## 2015-06-12 DIAGNOSIS — I160 Hypertensive urgency: Secondary | ICD-10-CM | POA: Diagnosis not present

## 2015-06-12 DIAGNOSIS — R260 Ataxic gait: Secondary | ICD-10-CM | POA: Diagnosis not present

## 2015-06-12 DIAGNOSIS — I6782 Cerebral ischemia: Secondary | ICD-10-CM | POA: Diagnosis not present

## 2015-06-12 DIAGNOSIS — I517 Cardiomegaly: Secondary | ICD-10-CM | POA: Diagnosis not present

## 2015-06-12 DIAGNOSIS — I071 Rheumatic tricuspid insufficiency: Secondary | ICD-10-CM | POA: Diagnosis not present

## 2015-06-12 DIAGNOSIS — Z8673 Personal history of transient ischemic attack (TIA), and cerebral infarction without residual deficits: Secondary | ICD-10-CM | POA: Diagnosis not present

## 2015-06-18 DIAGNOSIS — I4891 Unspecified atrial fibrillation: Secondary | ICD-10-CM | POA: Diagnosis not present

## 2015-06-25 DIAGNOSIS — Z7901 Long term (current) use of anticoagulants: Secondary | ICD-10-CM | POA: Diagnosis not present

## 2015-06-25 DIAGNOSIS — Z1212 Encounter for screening for malignant neoplasm of rectum: Secondary | ICD-10-CM | POA: Diagnosis not present

## 2015-06-25 DIAGNOSIS — I4891 Unspecified atrial fibrillation: Secondary | ICD-10-CM | POA: Diagnosis not present

## 2015-07-11 DIAGNOSIS — L309 Dermatitis, unspecified: Secondary | ICD-10-CM | POA: Diagnosis not present

## 2015-07-11 DIAGNOSIS — I10 Essential (primary) hypertension: Secondary | ICD-10-CM | POA: Diagnosis not present

## 2015-07-17 DIAGNOSIS — G250 Essential tremor: Secondary | ICD-10-CM | POA: Diagnosis not present

## 2015-07-17 DIAGNOSIS — G309 Alzheimer's disease, unspecified: Secondary | ICD-10-CM | POA: Diagnosis not present

## 2015-07-19 DIAGNOSIS — Z7901 Long term (current) use of anticoagulants: Secondary | ICD-10-CM | POA: Diagnosis not present

## 2015-07-19 DIAGNOSIS — I4891 Unspecified atrial fibrillation: Secondary | ICD-10-CM | POA: Diagnosis not present

## 2015-08-06 DIAGNOSIS — Z0000 Encounter for general adult medical examination without abnormal findings: Secondary | ICD-10-CM | POA: Diagnosis not present

## 2015-08-30 DIAGNOSIS — G4733 Obstructive sleep apnea (adult) (pediatric): Secondary | ICD-10-CM | POA: Diagnosis not present

## 2015-09-25 DIAGNOSIS — R2681 Unsteadiness on feet: Secondary | ICD-10-CM | POA: Diagnosis not present

## 2015-09-25 DIAGNOSIS — G309 Alzheimer's disease, unspecified: Secondary | ICD-10-CM | POA: Diagnosis not present

## 2015-09-25 DIAGNOSIS — G250 Essential tremor: Secondary | ICD-10-CM | POA: Diagnosis not present

## 2015-10-05 DIAGNOSIS — R2681 Unsteadiness on feet: Secondary | ICD-10-CM | POA: Diagnosis not present

## 2015-10-05 DIAGNOSIS — G250 Essential tremor: Secondary | ICD-10-CM | POA: Diagnosis not present

## 2015-10-05 DIAGNOSIS — I10 Essential (primary) hypertension: Secondary | ICD-10-CM | POA: Diagnosis not present

## 2015-10-05 DIAGNOSIS — G309 Alzheimer's disease, unspecified: Secondary | ICD-10-CM | POA: Diagnosis not present

## 2015-10-05 DIAGNOSIS — M6281 Muscle weakness (generalized): Secondary | ICD-10-CM | POA: Diagnosis not present

## 2015-10-07 DIAGNOSIS — R2681 Unsteadiness on feet: Secondary | ICD-10-CM | POA: Diagnosis not present

## 2015-10-07 DIAGNOSIS — G250 Essential tremor: Secondary | ICD-10-CM | POA: Diagnosis not present

## 2015-10-07 DIAGNOSIS — G309 Alzheimer's disease, unspecified: Secondary | ICD-10-CM | POA: Diagnosis not present

## 2015-10-07 DIAGNOSIS — M6281 Muscle weakness (generalized): Secondary | ICD-10-CM | POA: Diagnosis not present

## 2015-10-07 DIAGNOSIS — I10 Essential (primary) hypertension: Secondary | ICD-10-CM | POA: Diagnosis not present

## 2015-10-09 DIAGNOSIS — R2681 Unsteadiness on feet: Secondary | ICD-10-CM | POA: Diagnosis not present

## 2015-10-09 DIAGNOSIS — G250 Essential tremor: Secondary | ICD-10-CM | POA: Diagnosis not present

## 2015-10-09 DIAGNOSIS — M6281 Muscle weakness (generalized): Secondary | ICD-10-CM | POA: Diagnosis not present

## 2015-10-09 DIAGNOSIS — G309 Alzheimer's disease, unspecified: Secondary | ICD-10-CM | POA: Diagnosis not present

## 2015-10-09 DIAGNOSIS — I10 Essential (primary) hypertension: Secondary | ICD-10-CM | POA: Diagnosis not present

## 2015-10-17 DIAGNOSIS — M6281 Muscle weakness (generalized): Secondary | ICD-10-CM | POA: Diagnosis not present

## 2015-10-17 DIAGNOSIS — I10 Essential (primary) hypertension: Secondary | ICD-10-CM | POA: Diagnosis not present

## 2015-10-17 DIAGNOSIS — R2681 Unsteadiness on feet: Secondary | ICD-10-CM | POA: Diagnosis not present

## 2015-10-17 DIAGNOSIS — G250 Essential tremor: Secondary | ICD-10-CM | POA: Diagnosis not present

## 2015-10-17 DIAGNOSIS — G309 Alzheimer's disease, unspecified: Secondary | ICD-10-CM | POA: Diagnosis not present

## 2015-10-19 DIAGNOSIS — G309 Alzheimer's disease, unspecified: Secondary | ICD-10-CM | POA: Diagnosis not present

## 2015-10-19 DIAGNOSIS — I10 Essential (primary) hypertension: Secondary | ICD-10-CM | POA: Diagnosis not present

## 2015-10-19 DIAGNOSIS — R2681 Unsteadiness on feet: Secondary | ICD-10-CM | POA: Diagnosis not present

## 2015-10-19 DIAGNOSIS — G250 Essential tremor: Secondary | ICD-10-CM | POA: Diagnosis not present

## 2015-10-19 DIAGNOSIS — M6281 Muscle weakness (generalized): Secondary | ICD-10-CM | POA: Diagnosis not present

## 2015-10-31 DIAGNOSIS — M6281 Muscle weakness (generalized): Secondary | ICD-10-CM | POA: Diagnosis not present

## 2015-10-31 DIAGNOSIS — Z125 Encounter for screening for malignant neoplasm of prostate: Secondary | ICD-10-CM | POA: Diagnosis not present

## 2015-10-31 DIAGNOSIS — G309 Alzheimer's disease, unspecified: Secondary | ICD-10-CM | POA: Diagnosis not present

## 2015-10-31 DIAGNOSIS — R2681 Unsteadiness on feet: Secondary | ICD-10-CM | POA: Diagnosis not present

## 2015-10-31 DIAGNOSIS — E7800 Pure hypercholesterolemia, unspecified: Secondary | ICD-10-CM | POA: Diagnosis not present

## 2015-10-31 DIAGNOSIS — R5383 Other fatigue: Secondary | ICD-10-CM | POA: Diagnosis not present

## 2015-10-31 DIAGNOSIS — I10 Essential (primary) hypertension: Secondary | ICD-10-CM | POA: Diagnosis not present

## 2015-10-31 DIAGNOSIS — Z23 Encounter for immunization: Secondary | ICD-10-CM | POA: Diagnosis not present

## 2015-10-31 DIAGNOSIS — G4733 Obstructive sleep apnea (adult) (pediatric): Secondary | ICD-10-CM | POA: Diagnosis not present

## 2015-10-31 DIAGNOSIS — G250 Essential tremor: Secondary | ICD-10-CM | POA: Diagnosis not present

## 2015-11-07 DIAGNOSIS — G250 Essential tremor: Secondary | ICD-10-CM | POA: Diagnosis not present

## 2015-11-07 DIAGNOSIS — M6281 Muscle weakness (generalized): Secondary | ICD-10-CM | POA: Diagnosis not present

## 2015-11-07 DIAGNOSIS — G309 Alzheimer's disease, unspecified: Secondary | ICD-10-CM | POA: Diagnosis not present

## 2015-11-07 DIAGNOSIS — R2681 Unsteadiness on feet: Secondary | ICD-10-CM | POA: Diagnosis not present

## 2015-11-07 DIAGNOSIS — I10 Essential (primary) hypertension: Secondary | ICD-10-CM | POA: Diagnosis not present

## 2015-11-14 DIAGNOSIS — I4891 Unspecified atrial fibrillation: Secondary | ICD-10-CM | POA: Diagnosis not present

## 2015-11-14 DIAGNOSIS — Z1212 Encounter for screening for malignant neoplasm of rectum: Secondary | ICD-10-CM | POA: Diagnosis not present

## 2015-11-14 DIAGNOSIS — M25551 Pain in right hip: Secondary | ICD-10-CM | POA: Diagnosis not present

## 2015-11-14 DIAGNOSIS — R2689 Other abnormalities of gait and mobility: Secondary | ICD-10-CM | POA: Diagnosis not present

## 2015-11-14 DIAGNOSIS — E039 Hypothyroidism, unspecified: Secondary | ICD-10-CM | POA: Diagnosis not present

## 2015-11-14 DIAGNOSIS — Z7901 Long term (current) use of anticoagulants: Secondary | ICD-10-CM | POA: Diagnosis not present

## 2015-11-14 DIAGNOSIS — M160 Bilateral primary osteoarthritis of hip: Secondary | ICD-10-CM | POA: Diagnosis not present

## 2015-11-14 DIAGNOSIS — M170 Bilateral primary osteoarthritis of knee: Secondary | ICD-10-CM | POA: Diagnosis not present

## 2015-11-14 DIAGNOSIS — M25552 Pain in left hip: Secondary | ICD-10-CM | POA: Diagnosis not present

## 2015-11-14 DIAGNOSIS — M25569 Pain in unspecified knee: Secondary | ICD-10-CM | POA: Diagnosis not present

## 2015-11-14 DIAGNOSIS — E7800 Pure hypercholesterolemia, unspecified: Secondary | ICD-10-CM | POA: Diagnosis not present

## 2015-11-15 ENCOUNTER — Telehealth: Payer: Self-pay | Admitting: Cardiovascular Disease

## 2015-11-15 NOTE — Telephone Encounter (Signed)
Received records from [facility] for appointment on 12/18/15 with Dr Gwenlyn Found.  Records given to [facility] (medical records) for Dr Kennon Holter schedule on 12/18/15. lp

## 2015-11-22 DIAGNOSIS — M170 Bilateral primary osteoarthritis of knee: Secondary | ICD-10-CM | POA: Diagnosis not present

## 2015-11-22 DIAGNOSIS — M25559 Pain in unspecified hip: Secondary | ICD-10-CM | POA: Diagnosis not present

## 2015-11-22 DIAGNOSIS — M25569 Pain in unspecified knee: Secondary | ICD-10-CM | POA: Diagnosis not present

## 2015-11-22 DIAGNOSIS — M2550 Pain in unspecified joint: Secondary | ICD-10-CM | POA: Diagnosis not present

## 2015-12-05 DIAGNOSIS — G309 Alzheimer's disease, unspecified: Secondary | ICD-10-CM | POA: Diagnosis not present

## 2015-12-05 DIAGNOSIS — R413 Other amnesia: Secondary | ICD-10-CM | POA: Diagnosis not present

## 2015-12-18 ENCOUNTER — Ambulatory Visit: Payer: Medicare Other | Admitting: Cardiovascular Disease

## 2015-12-19 ENCOUNTER — Ambulatory Visit: Payer: Medicare Other | Admitting: Cardiovascular Disease

## 2016-01-08 ENCOUNTER — Encounter: Payer: Self-pay | Admitting: Cardiovascular Disease

## 2016-01-09 ENCOUNTER — Ambulatory Visit: Payer: Medicare Other | Admitting: Cardiovascular Disease

## 2016-01-09 ENCOUNTER — Encounter: Payer: Self-pay | Admitting: *Deleted

## 2016-01-17 DIAGNOSIS — I4891 Unspecified atrial fibrillation: Secondary | ICD-10-CM | POA: Diagnosis not present

## 2016-01-21 DIAGNOSIS — I482 Chronic atrial fibrillation: Secondary | ICD-10-CM | POA: Diagnosis not present

## 2016-01-21 DIAGNOSIS — R5383 Other fatigue: Secondary | ICD-10-CM | POA: Diagnosis not present

## 2016-01-21 DIAGNOSIS — N189 Chronic kidney disease, unspecified: Secondary | ICD-10-CM | POA: Diagnosis not present

## 2016-02-10 ENCOUNTER — Encounter (HOSPITAL_COMMUNITY): Payer: Self-pay

## 2016-02-10 ENCOUNTER — Emergency Department (HOSPITAL_COMMUNITY): Payer: Medicare Other

## 2016-02-10 ENCOUNTER — Observation Stay (HOSPITAL_COMMUNITY)
Admission: EM | Admit: 2016-02-10 | Discharge: 2016-02-12 | Disposition: A | Discharge status: Skilled Nursing Facility | Payer: Medicare Other | Attending: Internal Medicine | Admitting: Internal Medicine

## 2016-02-10 DIAGNOSIS — R270 Ataxia, unspecified: Secondary | ICD-10-CM | POA: Insufficient documentation

## 2016-02-10 DIAGNOSIS — N529 Male erectile dysfunction, unspecified: Secondary | ICD-10-CM | POA: Diagnosis not present

## 2016-02-10 DIAGNOSIS — Z7901 Long term (current) use of anticoagulants: Secondary | ICD-10-CM | POA: Insufficient documentation

## 2016-02-10 DIAGNOSIS — R259 Unspecified abnormal involuntary movements: Secondary | ICD-10-CM

## 2016-02-10 DIAGNOSIS — G20 Parkinson's disease: Secondary | ICD-10-CM | POA: Insufficient documentation

## 2016-02-10 DIAGNOSIS — G309 Alzheimer's disease, unspecified: Secondary | ICD-10-CM | POA: Insufficient documentation

## 2016-02-10 DIAGNOSIS — I6782 Cerebral ischemia: Secondary | ICD-10-CM | POA: Insufficient documentation

## 2016-02-10 DIAGNOSIS — H532 Diplopia: Secondary | ICD-10-CM | POA: Diagnosis not present

## 2016-02-10 DIAGNOSIS — F0150 Vascular dementia without behavioral disturbance: Secondary | ICD-10-CM | POA: Diagnosis present

## 2016-02-10 DIAGNOSIS — Z91040 Latex allergy status: Secondary | ICD-10-CM | POA: Diagnosis not present

## 2016-02-10 DIAGNOSIS — F319 Bipolar disorder, unspecified: Secondary | ICD-10-CM | POA: Diagnosis present

## 2016-02-10 DIAGNOSIS — I482 Chronic atrial fibrillation, unspecified: Secondary | ICD-10-CM | POA: Diagnosis present

## 2016-02-10 DIAGNOSIS — E039 Hypothyroidism, unspecified: Secondary | ICD-10-CM | POA: Insufficient documentation

## 2016-02-10 DIAGNOSIS — I6789 Other cerebrovascular disease: Secondary | ICD-10-CM | POA: Diagnosis not present

## 2016-02-10 DIAGNOSIS — R531 Weakness: Secondary | ICD-10-CM | POA: Diagnosis not present

## 2016-02-10 DIAGNOSIS — R42 Dizziness and giddiness: Secondary | ICD-10-CM | POA: Diagnosis not present

## 2016-02-10 DIAGNOSIS — Z8673 Personal history of transient ischemic attack (TIA), and cerebral infarction without residual deficits: Secondary | ICD-10-CM | POA: Diagnosis not present

## 2016-02-10 DIAGNOSIS — R29818 Other symptoms and signs involving the nervous system: Secondary | ICD-10-CM | POA: Diagnosis not present

## 2016-02-10 DIAGNOSIS — J341 Cyst and mucocele of nose and nasal sinus: Secondary | ICD-10-CM | POA: Insufficient documentation

## 2016-02-10 DIAGNOSIS — I639 Cerebral infarction, unspecified: Secondary | ICD-10-CM

## 2016-02-10 DIAGNOSIS — R296 Repeated falls: Secondary | ICD-10-CM | POA: Diagnosis not present

## 2016-02-10 DIAGNOSIS — I10 Essential (primary) hypertension: Secondary | ICD-10-CM | POA: Diagnosis present

## 2016-02-10 DIAGNOSIS — R4781 Slurred speech: Secondary | ICD-10-CM | POA: Diagnosis not present

## 2016-02-10 DIAGNOSIS — F0280 Dementia in other diseases classified elsewhere without behavioral disturbance: Secondary | ICD-10-CM | POA: Diagnosis not present

## 2016-02-10 DIAGNOSIS — E785 Hyperlipidemia, unspecified: Secondary | ICD-10-CM | POA: Insufficient documentation

## 2016-02-10 DIAGNOSIS — H5500 Unspecified nystagmus: Secondary | ICD-10-CM | POA: Insufficient documentation

## 2016-02-10 DIAGNOSIS — G2581 Restless legs syndrome: Secondary | ICD-10-CM | POA: Diagnosis not present

## 2016-02-10 LAB — COMPREHENSIVE METABOLIC PANEL
AST: 19 U/L (ref 15–41)
Albumin: 3.7 g/dL (ref 3.5–5.0)
Alkaline Phosphatase: 74 U/L (ref 38–126)
Anion gap: 8 (ref 5–15)
BILIRUBIN TOTAL: 0.5 mg/dL (ref 0.3–1.2)
BUN: 12 mg/dL (ref 6–20)
CHLORIDE: 104 mmol/L (ref 101–111)
CO2: 26 mmol/L (ref 22–32)
CREATININE: 0.97 mg/dL (ref 0.61–1.24)
Calcium: 9.8 mg/dL (ref 8.9–10.3)
GFR calc Af Amer: >60 mL/min (ref >60)
Glucose, Bld: 99 mg/dL (ref 65–99)
POTASSIUM: 4 mmol/L (ref 3.5–5.1)
Sodium: 138 mmol/L (ref 135–145)
TOTAL PROTEIN: 6 g/dL — AB (ref 6.5–8.1)

## 2016-02-10 LAB — DIFFERENTIAL
BASOS ABS: 0 10*3/uL (ref 0.0–0.1)
Basophils Relative: 1 %
Eosinophils Absolute: 0.2 10*3/uL (ref 0.0–0.7)
Eosinophils Relative: 3 %
LYMPHS ABS: 1.7 10*3/uL (ref 0.7–4.0)
Lymphocytes Relative: 31 %
MONOS PCT: 7 %
Monocytes Absolute: 0.4 10*3/uL (ref 0.1–1.0)
NEUTROS ABS: 3.4 10*3/uL (ref 1.7–7.7)
NEUTROS PCT: 58 %

## 2016-02-10 LAB — I-STAT CHEM 8, ED
BUN: 13 mg/dL (ref 6–20)
CHLORIDE: 101 mmol/L (ref 101–111)
CREATININE: 0.9 mg/dL (ref 0.61–1.24)
Calcium, Ion: 1.22 mmol/L (ref 1.15–1.40)
GLUCOSE: 98 mg/dL (ref 65–99)
HCT: 36 % — ABNORMAL LOW (ref 39.0–52.0)
Hemoglobin: 12.2 g/dL — ABNORMAL LOW (ref 13.0–17.0)
POTASSIUM: 3.9 mmol/L (ref 3.5–5.1)
Sodium: 140 mmol/L (ref 135–145)
TCO2: 28 mmol/L (ref 0–100)

## 2016-02-10 LAB — CBC
HEMATOCRIT: 37.7 % — AB (ref 39.0–52.0)
HEMOGLOBIN: 12.3 g/dL — AB (ref 13.0–17.0)
MCH: 31 pg (ref 26.0–34.0)
MCHC: 32.6 g/dL (ref 30.0–36.0)
MCV: 95 fL (ref 78.0–100.0)
Platelets: 143 10*3/uL — ABNORMAL LOW (ref 150–400)
RBC: 3.97 MIL/uL — AB (ref 4.22–5.81)
RDW: 12.8 % (ref 11.5–15.5)
WBC: 5.6 10*3/uL (ref 4.0–10.5)

## 2016-02-10 LAB — PROTIME-INR
INR: 1.45
Prothrombin Time: 17.8 seconds — ABNORMAL HIGH (ref 11.4–15.2)

## 2016-02-10 LAB — I-STAT TROPONIN, ED: Troponin i, poc: 0 ng/mL (ref 0.00–0.08)

## 2016-02-10 LAB — APTT: APTT: 37 s — AB (ref 24–36)

## 2016-02-10 MED ORDER — RIVAROXABAN 20 MG PO TABS
20.0000 mg | ORAL_TABLET | Freq: Every day | ORAL | Status: DC
  Administered 2016-02-11 (×2): 20 mg via ORAL
  Filled 2016-02-10 (×2): qty 1

## 2016-02-10 MED ORDER — CARBIDOPA-LEVODOPA 25-250 MG PO TABS
1.0000 | ORAL_TABLET | Freq: Two times a day (BID) | ORAL | Status: DC
  Administered 2016-02-11 – 2016-02-12 (×4): 1 via ORAL
  Filled 2016-02-10 (×6): qty 1

## 2016-02-10 MED ORDER — SODIUM CHLORIDE 0.9 % IV BOLUS (SEPSIS): 500.0000 mL | Freq: Once | INTRAVENOUS | Status: DC

## 2016-02-10 NOTE — ED Provider Notes (Signed)
[place] DEPT Provider Note   CSN: [id number] Arrival date & time: 02/10/16  1815  Initial assessment performed by me at 46 An emergency department physician performed an initial assessment on this suspected stroke patient at 1816.  History   Chief Complaint Chief Complaint  Patient presents with  . Code Stroke    HPI [name] [name] is a 70 y.o. male.He presents for evaluation of possible code stroke.  Last time no normal was approximately 3 days. Fairly patient had a fall at home today as he was watching a football game. Wife states that he responded to ammonia. His speech was slurred. States that last 3 days he's been requiring a cane or walker to walk. He has episodes of dizziness in the past. However family states clear difference today and for at least 3 days  HPI  Past Medical History:  Diagnosis Date  . Alzheimer disease   . Atrial fibrillation ([place])   . Bipolar 1 disorder ([place])   . Dementia   . ED (erectile dysfunction)   . HLD (hyperlipidemia)   . HTN (hypertension)   . Lipoma of skin   . Other and unspecified hyperlipidemia 03/20/2013  . Poor balance   . Stroke ([place]) 1991  . Stroke ([place])   . Subclinical hypothyroidism   . Tremor     Patient Active Problem List   Diagnosis Date Noted  . Mixed Alzheimer's and vascular dementia 10/28/2013  . Parkinsonian features 03/31/2013  . CVA (cerebral vascular accident) ([place]) 03/23/2013  . Bipolar disorder, unspecified 03/20/2013  . Other and unspecified hyperlipidemia 03/20/2013  . Brain TIA 03/18/2013  . Gait instability 03/17/2013  . Atrial fibrillation ([place]) 03/17/2013  . Unspecified essential hypertension 03/17/2013    Past Surgical History:  Procedure Laterality Date  . TONSILLECTOMY AND ADENOIDECTOMY         Home Medications    Prior to Admission medications   Medication Sig Start Date End Date Taking? Authorizing Provider  atorvastatin (LIPITOR) 40 MG tablet Take 40 mg by mouth at bedtime.  11/27/15  Yes Historical Provider, MD  carbidopa-levodopa (SINEMET IR) 25-250 MG per tablet Take 1 tablet by mouth 2 (two) times daily.   Yes Historical Provider, MD  divalproex (DEPAKOTE ER) 500 MG 24 hr tablet Take 500 mg by mouth 2 (two) times daily. 01/22/16  Yes Historical Provider, MD  donepezil (ARICEPT) 10 MG tablet Take 1 tablet (10 mg total) by mouth at bedtime. 08/08/14  Yes Pieter Partridge, DO  folic acid (FOLVITE) 1 MG tablet Take 1 mg by mouth at bedtime.    Yes Historical Provider, MD  gabapentin (NEURONTIN) 100 MG capsule Take 200 mg by mouth 2 (two) times daily. 02/02/16  Yes Historical Provider, MD  lamoTRIgine (LAMICTAL) 150 MG tablet Take 150 mg by mouth 2 (two) times daily.   Yes Historical Provider, MD  lisinopril (PRINIVIL,ZESTRIL) 10 MG tablet Take 10 mg by mouth at bedtime.    Yes Historical Provider, MD  memantine (NAMENDA) 5 MG tablet Take 5 mg by mouth 2 (two) times daily.   Yes Historical Provider, MD  rivaroxaban (XARELTO) 20 MG TABS tablet Take 20 mg by mouth at bedtime.   Yes Historical Provider, MD  STUDY MEDICATION Cholesterol medicine - injections    Historical Provider, MD    Family History Family History  Problem Relation Age of Onset  . Suicidality    . Cancer    . Alcohol abuse    . Cancer Mother   .  Suicidality Father     Social History Social History  Substance Use Topics  . Smoking status: Never Smoker  . Smokeless tobacco: Never Used  . Alcohol use No     Comment: occasional     Allergies   Latex   Review of Systems Review of Systems  Constitutional: Negative for appetite change, chills, diaphoresis, fatigue and fever.  HENT: Negative for mouth sores, sore throat and trouble swallowing.   Eyes: Negative for visual disturbance.  Respiratory: Negative for cough, chest tightness, shortness of breath and wheezing.   Cardiovascular: Negative for chest pain.  Gastrointestinal: Negative for abdominal distention, abdominal pain, diarrhea, nausea  and vomiting.  Endocrine: Negative for polydipsia, polyphagia and polyuria.  Genitourinary: Negative for dysuria, frequency and hematuria.  Musculoskeletal: Positive for gait problem.  Skin: Negative for color change, pallor and rash.  Neurological: Positive for dizziness and weakness. Negative for syncope, light-headedness and headaches.       Difficulty walking  Hematological: Does not bruise/bleed easily.  Psychiatric/Behavioral: Negative for behavioral problems and confusion.     Physical Exam Updated Vital Signs BP (!) 183/102   Pulse 74   Temp 97.5 F (36.4 C)   Resp 16   Wt 181 lb 14.1 oz (82.5 kg)   SpO2 97%   BMI 26.10 kg/m   Physical Exam  Constitutional: He is oriented to person, place, and time. He appears well-developed and well-nourished. No distress.  HENT:  Head: Normocephalic.  Eyes: Conjunctivae are normal. Pupils are equal, round, and reactive to light. No scleral icterus.  Neck: Normal range of motion. Neck supple. No thyromegaly present.  Cardiovascular: Normal rate and regular rhythm.  Exam reveals no gallop and no friction rub.   No murmur heard. Pulmonary/Chest: Effort normal and breath sounds normal. No respiratory distress. He has no wheezes. He has no rales.  Abdominal: Soft. Bowel sounds are normal. He exhibits no distension. There is no tenderness. There is no rebound.  Musculoskeletal: Normal range of motion.  Neurological: He is alert and oriented to person, place, and time.  Skin: Skin is warm and dry. No rash noted.  Psychiatric: He has a normal mood and affect. His behavior is normal.     ED Treatments / Results  Labs (all labs ordered are listed, but only abnormal results are displayed) Labs Reviewed  PROTIME-INR - Abnormal; Notable for the following:       Result Value   Prothrombin Time 17.8 (*)    All other components within normal limits  APTT - Abnormal; Notable for the following:    aPTT 37 (*)    All other components within  normal limits  CBC - Abnormal; Notable for the following:    RBC 3.97 (*)    Hemoglobin 12.3 (*)    HCT 37.7 (*)    Platelets 143 (*)    All other components within normal limits  COMPREHENSIVE METABOLIC PANEL - Abnormal; Notable for the following:    Total Protein 6.0 (*)    ALT <5 (*)    All other components within normal limits  I-STAT CHEM 8, ED - Abnormal; Notable for the following:    Hemoglobin 12.2 (*)    HCT 36.0 (*)    All other components within normal limits  DIFFERENTIAL  I-STAT TROPOININ, ED  I-STAT TROPOININ, ED  I-STAT CHEM 8, ED    EKG  EKG Interpretation None       Radiology Mr Brain Wo Contrast  Result Date: 02/10/2016 CLINICAL DATA:  Ataxia, nystagmus, and diplopia. Symptoms for 3 days. EXAM: MRI HEAD WITHOUT CONTRAST TECHNIQUE: Multiplanar, multiecho pulse sequences of the brain and surrounding structures were obtained without intravenous contrast. COMPARISON:  Head CT 02/10/2016 and MRI 03/17/2013 FINDINGS: Brain: There is no evidence of acute infarct, intracranial hemorrhage, mass, midline shift, or extra-axial fluid collection. There is mild cerebral atrophy. Scattered T2 hyperintensities in the subcortical and deep cerebral white matter are unchanged from the prior MRI and nonspecific but compatible with mild chronic small vessel ischemic disease. Chronic infarct involving the right midbrain is again noted with unchanged volume loss. Vascular: Major intracranial vascular flow voids are preserved. Skull and upper cervical spine: Unremarkable bone marrow signal. Sinuses/Orbits: Unremarkable orbits. Left maxillary sinus mucous retention cyst. Clear mastoid air cells. Other: None. IMPRESSION: 1. No acute intracranial abnormality. 2. Mild chronic small vessel ischemic disease. Electronically Signed   By: Logan Bores M.D.   On: 02/10/2016 20:48   Ct Head Code Stroke W/o Cm  Result Date: 02/10/2016 CLINICAL DATA:  Code stroke.  Slurred speech. EXAM: CT HEAD  WITHOUT CONTRAST TECHNIQUE: Contiguous axial images were obtained from the base of the skull through the vertex without intravenous contrast. COMPARISON:  Brain MRI and CT 03/17/2013 FINDINGS: Brain: There is no evidence of acute cortical infarct, intracranial hemorrhage, mass, midline shift, or extra-axial fluid collection. There is mild cerebral atrophy. Volume loss in the right cerebral peduncle is unchanged and correlates to the previously described remote infarct. Cerebral white matter hypodensities are nonspecific but compatible with mild chronic small vessel ischemic disease. Vascular: No hyperdense vessel or unexpected calcification. Skull: No fracture or focal osseous lesion. Sinuses/Orbits: Left maxillary sinus mucous retention cyst. Clear mastoid air cells. Unremarkable orbits. Other: None. ASPECTS [facility] Stroke Program Early CT Score) - Ganglionic level infarction (caudate, lentiform nuclei, internal capsule, insula, M1-M3 cortex): 7 - Supraganglionic infarction (M4-M6 cortex): 3 Total score (0-10 with 10 being normal): 10 IMPRESSION: 1. No evidence of acute intracranial abnormality. 2. ASPECTS is 10. 3. Mild chronic small vessel ischemic disease. These results were called by telephone at the time of interpretation on 02/10/2016 at 6:30 pm to Dr. Leonel Ramsay, who verbally acknowledged these results. Electronically Signed   By: Logan Bores M.D.   On: 02/10/2016 18:33    Procedures Procedures (including critical care time)  Medications Ordered in ED Medications - No data to display   Initial Impression / Assessment and Plan / ED Course  I have reviewed the triage vital signs and the nursing notes.  Pertinent labs & imaging results that were available during my care of the patient were reviewed by me and considered in my medical decision making (see chart for details).  Clinical Course     CT with no acute abnormalities. MRI does actually return normal. Care discussed with Dr. Loleta Books  of hospitalist. Also discussed the case with neurology, Dr. Cheral Marker, also see the patient in consultation in the emergency room   Final Clinical Impressions(s) / ED Diagnoses   Final diagnoses:  Dizzy    New Prescriptions New Prescriptions   No medications on file     Tanna Furry, MD 02/10/16 2247

## 2016-02-10 NOTE — ED Notes (Signed)
Given phone number by pt's wife, asking to call in the event pt's plan of care changes or his bed is assigned.  [name] [name]: [phone]

## 2016-02-10 NOTE — ED Notes (Signed)
Pt transported to MRI 

## 2016-02-10 NOTE — ED Notes (Signed)
Admitting provider at bedside.

## 2016-02-10 NOTE — Progress Notes (Signed)
Patient has not had a stroke, swallow screen performed in error.  Patient at baseline risk for aspiration, which is low.  Discussed hypothetical risks of aspiration with family, who are aware and desire to eat.

## 2016-02-10 NOTE — H&P (Signed)
History and Physical  Patient Name: [name] [name]     [id number]    DOB: [birth year]    DOA: 02/10/2016 PCP: Horatio Pel, MD   Patient coming from: Home  Chief Complaint: Weakness, inability to move  HPI: [name] [name] is a 70 y.o. male with a past medical history significant for dementia community dwelling, Bipolar on Depakote and lamotrigine, very remote history of stroke, Afib on AC, a chronic unexplained episodic diplopia and ataxia, HTN, and restless leg syndrome? on Sinemet who presents with movement problems for 1 day.  The patient is a challenging historian because of dementia and interrupting his wife frequently, and wife is better but also a slightly disorganized historian, and so history taking initially difficult.    Previous notes that suggest he had a fall are incorrect, there was no witnessed or suspected fall, verified with family. Strictly interviewing patient then wife then daughter in turn, the following timeline is clear:  -Patient skipped his nightly Sinemet last night because of dementia, and also his dose this morning.   -Woke with gait troubles -Felt he "couldn't walk right", wasn't going to church with a walker, and so went back to bed; family left him there for most of day, watching TV -Patient maybe tried to get up once or twice during the day to go to bathroom, sometimes could, other times couldn't; hard time clarifying why not, other than "I just couldn't move" -Evening time, patient got out of bed (he's not clear how, but didn't fall, there was NO THUMP or fall or witnessed fall) got on his hands and knees, but then couldn't get any further -Because he got stuck on hands and knees on floor he started hollering for family -Daughter and grandson came up and found him on the floor of his bedroom on all fours, daughter went to call 9-1-1, another family member helped him stand with his walker, then EMS transported him. -Before transport he did take an  afternoon dose of Sinemet, family think  In brief, patient was in normal health until he missed two most recent doses of Sinemet. Following that, he had movement and gait difficulties, culminating in the afternoon, him crawling onto the floor to get to the bathroom and getting stuck, then being brought to ER.  There was no unilateral weakness, focal numbness, LOC, confusion, aphasia.  Family were concerned for slurred speech initially.  There was no vertigo, despite what was described earlier.  This was more severe than one of his typical episodes of diplopia and ataxia.  Because of slurred speech, CODE STROKE initially called.  ED course: -Afebrile, heart rate 70, respirations and pulse ox normal, BP 180/95 -Na 138, K 4.0, Cr 0.97, WBC 5.6K, Hgb 12.3 -Trop negative, coags normal -CT head only chronic disease -Neurology were consulted who suspected cerebellar stroke, recommended MRI and admission for stroke -MRI showed old infarct NO ACUTE INFARCT -Patient improved rapidly in ER, just felt more weak than usual, TRH were asked to evaluate for weakness     Of note, the patient's wife describes an identical episode in [place] about six months ago.  At that time, he had again skipped his Sinemet for a day or two (family say he doesn't have Parkinson's, patient and family aren't clear why he takes Sinemet).  He at that time also couldn't walk, seemed to have slurred speech and weakness and was admitted to a hospital in [place] where a full work up for stroke was performed that was negative.  He seemed to be better by the next day and was discharged home.  He has had home health PT before, not currently.  HOWEVER, although it is in his problem list, he does not have a Neurology diagnosis of Parkinson's, at least that I see.  He has been on carbidopa for years, per Dr. Georgie Chard first note, this is only for RESTLESS LEG SYNDROME, and he has never seen someone for movement disorders.  He has a chronic  tremor, that was described by his new Neurologist as an intention tremor.       ROS: Review of Systems  Constitutional: Negative for chills and fever.  Eyes: Negative for blurred vision (or other vision changes, maybe some mild chronic diplopia this week).  Neurological: Positive for tremors (chronic) and speech change. Negative for dizziness (nor vertigo), sensory change, focal weakness, seizures and loss of consciousness.  Psychiatric/Behavioral: Positive for memory loss (chronic). Negative for hallucinations.  All other systems reviewed and are negative.         Past Medical History:  Diagnosis Date  . Alzheimer disease   . Atrial fibrillation ([place])   . Bipolar 1 disorder ([place])   . Dementia   . ED (erectile dysfunction)   . HLD (hyperlipidemia)   . HTN (hypertension)   . Lipoma of skin   . Other and unspecified hyperlipidemia 03/20/2013  . Poor balance   . Stroke ([place]) 1991  . Stroke ([place])   . Subclinical hypothyroidism   . Tremor     Past Surgical History:  Procedure Laterality Date  . TONSILLECTOMY AND ADENOIDECTOMY      Social History: Patient lives with his wife.  The patient walks unassisted usually, sometimes with a walker.  He is not a smoker.    Allergies  Allergen Reactions  . Latex Rash    Reaction to knee wraps    Family history: family history includes Cancer in his mother; Suicidality in his father.  Prior to Admission medications   Medication Sig Start Date End Date Taking? Authorizing Provider  atorvastatin (LIPITOR) 40 MG tablet Take 40 mg by mouth at bedtime. 11/27/15  Yes Historical Provider, MD  carbidopa-levodopa (SINEMET IR) 25-250 MG per tablet Take 1 tablet by mouth 2 (two) times daily.   Yes Historical Provider, MD  divalproex (DEPAKOTE ER) 500 MG 24 hr tablet Take 500 mg by mouth 2 (two) times daily. 01/22/16  Yes Historical Provider, MD  donepezil (ARICEPT) 10 MG tablet Take 1 tablet (10 mg total) by mouth at bedtime. 08/08/14  Yes Pieter Partridge, DO  folic acid (FOLVITE) 1 MG tablet Take 1 mg by mouth at bedtime.    Yes Historical Provider, MD  gabapentin (NEURONTIN) 100 MG capsule Take 200 mg by mouth 2 (two) times daily. 02/02/16  Yes Historical Provider, MD  lamoTRIgine (LAMICTAL) 150 MG tablet Take 150 mg by mouth 2 (two) times daily.   Yes Historical Provider, MD  lisinopril (PRINIVIL,ZESTRIL) 10 MG tablet Take 10 mg by mouth at bedtime.    Yes Historical Provider, MD  memantine (NAMENDA) 5 MG tablet Take 5 mg by mouth 2 (two) times daily.   Yes Historical Provider, MD  rivaroxaban (XARELTO) 20 MG TABS tablet Take 20 mg by mouth at bedtime.   Yes Historical Provider, MD  STUDY MEDICATION Cholesterol medicine - injections    Historical Provider, MD       Physical Exam: BP (!) 183/102   Pulse 74   Temp 97.5 F (36.4 C)  Resp 16   Wt 82.5 kg (181 lb 14.1 oz)   SpO2 97%   BMI 26.10 kg/m  General appearance: Thin, elderly adult male, alert and in no acute distress, comfortable and responding to questions. Eyes: Anicteric, conjunctiva pink, lids and lashes normal. PERRL.    ENT: No nasal deformity, discharge, epistaxis.  Hearing normal. OP moist without lesions.   Skin: Warm and dry.  No jaundice.  No suspicious rashes or lesions. Cardiac: RRR, nl S1-S2, no murmurs appreciated.  Capillary refill is brisk.  JVP normal.  No LE edema.  Radial pulses 2+ and symmetric.  No carotid bruits. Respiratory: Normal respiratory rate and rhythm.  CTAB without rales or wheezes. Abdomen: Abdomen soft. No ascites, distension, hepatosplenomegaly.   MSK: No deformities or effusions.  No cyanosis or clubbing. Neuro: Pupils are 3 mm and reactive to 2 mm.  Extraocular movements are intact, nystagmus.  Cranial nerve 5 is within normal limits.  Cranial nerve 7 is symmetrical.  Cranial nerve 8 is within normal limits.  Cranial nerves 9 and 10 reveal equal palate elevation.  Cranial nerve 11 reveals sternocleidomastoid strong.  Cranial nerve 12  is midline.  Motor strength testing is 5/5 in the upper and lower extremities bilaterally with normal motor, tone and bulk. Sensory examination is intact to light touch. Gait not tested due to instability.  Finger-to-nose testing is within normal limits, no tremor.  The patient is oriented to time, place and person.  Speech is fluent.  Naming is grossly intact.  Recall, recent and remote, seem impaired, although he can mostly describe what happened today.  His general fund of knowledge seem within normal limits.  Attention span and concentration are within normal limits. No cogwheeling.  Wooden facies. Psych: Sensorium intact and responding to questions, attention normal.  Behavior appropriate.  Affect normal.  Judgment and insight appear basically normal.     Labs on Admission:  I have personally reviewed following labs and imaging studies: CBC:  Recent Labs Lab 02/10/16 1818 02/10/16 1824  WBC 5.6  --   NEUTROABS 3.4  --   HGB 12.3* 12.2*  HCT 37.7* 36.0*  MCV 95.0  --   PLT 143*  --    Basic Metabolic Panel:  Recent Labs Lab 02/10/16 1818 02/10/16 1824  NA 138 140  K 4.0 3.9  CL 104 101  CO2 26  --   GLUCOSE 99 98  BUN 12 13  CREATININE 0.97 0.90  CALCIUM 9.8  --    GFR: CrCl cannot be calculated (Unknown ideal weight.).  Liver Function Tests:  Recent Labs Lab 02/10/16 1818  AST 19  ALT <5*  ALKPHOS 74  BILITOT 0.5  PROT 6.0*  ALBUMIN 3.7   No results for input(s): LIPASE, AMYLASE in the last 168 hours. No results for input(s): AMMONIA in the last 168 hours. Coagulation Profile:  Recent Labs Lab 02/10/16 1818  INR 1.45   Cardiac Enzymes: No results for input(s): CKTOTAL, CKMB, CKMBINDEX, TROPONINI in the last 168 hours. BNP (last 3 results) No results for input(s): PROBNP in the last 8760 hours. HbA1C: No results for input(s): HGBA1C in the last 72 hours. CBG: No results for input(s): GLUCAP in the last 168 hours. Lipid Profile: No results for  input(s): CHOL, HDL, LDLCALC, TRIG, CHOLHDL, LDLDIRECT in the last 72 hours. Thyroid Function Tests: No results for input(s): TSH, T4TOTAL, FREET4, T3FREE, THYROIDAB in the last 72 hours. Anemia Panel: No results for input(s): VITAMINB12, FOLATE, FERRITIN, TIBC, IRON, RETICCTPCT  in the last 72 hours. Sepsis Labs: Invalid input(s): PROCALCITONIN, LACTICIDVEN No results found for this or any previous visit (from the past 240 hour(s)).       Radiological Exams on Admission: Personally reviewed CT head and MR brain reports: Mr Brain Wo Contrast  Result Date: 02/10/2016 CLINICAL DATA:  Ataxia, nystagmus, and diplopia. Symptoms for 3 days. EXAM: MRI HEAD WITHOUT CONTRAST TECHNIQUE: Multiplanar, multiecho pulse sequences of the brain and surrounding structures were obtained without intravenous contrast. COMPARISON:  Head CT 02/10/2016 and MRI 03/17/2013 FINDINGS: Brain: There is no evidence of acute infarct, intracranial hemorrhage, mass, midline shift, or extra-axial fluid collection. There is mild cerebral atrophy. Scattered T2 hyperintensities in the subcortical and deep cerebral white matter are unchanged from the prior MRI and nonspecific but compatible with mild chronic small vessel ischemic disease. Chronic infarct involving the right midbrain is again noted with unchanged volume loss. Vascular: Major intracranial vascular flow voids are preserved. Skull and upper cervical spine: Unremarkable bone marrow signal. Sinuses/Orbits: Unremarkable orbits. Left maxillary sinus mucous retention cyst. Clear mastoid air cells. Other: None. IMPRESSION: 1. No acute intracranial abnormality. 2. Mild chronic small vessel ischemic disease. Electronically Signed   By: Logan Bores M.D.   On: 02/10/2016 20:48   Ct Head Code Stroke W/o Cm  Result Date: 02/10/2016 CLINICAL DATA:  Code stroke.  Slurred speech. EXAM: CT HEAD WITHOUT CONTRAST TECHNIQUE: Contiguous axial images were obtained from the base of the skull  through the vertex without intravenous contrast. COMPARISON:  Brain MRI and CT 03/17/2013 FINDINGS: Brain: There is no evidence of acute cortical infarct, intracranial hemorrhage, mass, midline shift, or extra-axial fluid collection. There is mild cerebral atrophy. Volume loss in the right cerebral peduncle is unchanged and correlates to the previously described remote infarct. Cerebral white matter hypodensities are nonspecific but compatible with mild chronic small vessel ischemic disease. Vascular: No hyperdense vessel or unexpected calcification. Skull: No fracture or focal osseous lesion. Sinuses/Orbits: Left maxillary sinus mucous retention cyst. Clear mastoid air cells. Unremarkable orbits. Other: None. ASPECTS [facility] Stroke Program Early CT Score) - Ganglionic level infarction (caudate, lentiform nuclei, internal capsule, insula, M1-M3 cortex): 7 - Supraganglionic infarction (M4-M6 cortex): 3 Total score (0-10 with 10 being normal): 10 IMPRESSION: 1. No evidence of acute intracranial abnormality. 2. ASPECTS is 10. 3. Mild chronic small vessel ischemic disease. These results were called by telephone at the time of interpretation on 02/10/2016 at 6:30 pm to Dr. Leonel Ramsay, who verbally acknowledged these results. Electronically Signed   By: Logan Bores M.D.   On: 02/10/2016 18:33    EKG: Independently reviewed. Rate 82, QTc 520, afib.    Assessment/Plan  1. Difficulty moving:  The patient's MRI rules out stroke and his history, when taken individually by him and then verified with wife and daughter each, is not particularly suggestive of TIA.    Ultimately it seems essentially that "he couldn't move", not from generalized or focal weakness, but from movement difficulty itself.  Since taking a dose of Sinemet this afternoon and being in the ER and getting fluids, this has improved.    Suspect dehydration vs missed Sinemet. -Fluid bolus -Restart Sinemet -PT eval I realize that it seems  implausible the patient would have Parkinson's disease or Parkinsonism that is Sinemet dependent, without this ever being diagnosed or treated by his neurologists, and so I favor this is not the case.    2. Afib:  CHADS2Vasc 4.  On anticoagulation, no rate control.   -Continue  rivaroxaban  3. HTN and stroke secondary prevention:  Hypertensive at admission. -Continue lisionpril -Continue statin  4. Bipolar:  Stable. -Continue lamotrigine and Depakote  5. Dementia:  -Continue donepezil and memantine  6. Other medications:  -Continue gabapentin (from Neuro, for tremor)      DVT prophylaxis: N/A  Code Status: FULL  Family Communication: Daughter and wife  Disposition Plan: Anticipate IV fluids and restart S[place] and PT eval and CM consult for [facility] PT Consults called: Neuro Admission status: OBS At the point of initial evaluation, it is my clinical opinion that admission for OBSERVATION is reasonable and necessary because the patient's presenting complaints in the context of their chronic conditions represent sufficient risk of deterioration or significant morbidity to constitute reasonable grounds for close observation in the hospital setting, but that the patient may be medically stable for discharge from the hospital within 24 to 48 hours.    Medical decision making: Patient seen at 10:45 PM on 02/10/2016.  The patient was discussed with Dr. Jeneen Rinks.  Overnight on-call Neurology independently reviewed MRI images, discussed case, was not asked to re-evaluate the patient.  What exists of the patient's chart was reviewed in depth and summarized above.  Clinical condition: stable.        Edwin Dada [facility] Pager [phone]

## 2016-02-10 NOTE — Consult Note (Signed)
Neurology Consultation Reason for Consult: Ataxia Referring Physician: Alyson Locket  CC: Unsteadiness  History is obtained from: Wife  HPI: QUASHUN BOMER is a 70 y.o. male who presents with 3 days of unsteadiness. He was watching the game earlier, and fell and when his wife responded to ammonia was slurring his speech which wasn't present earlier and therefore EMS was called and they activated a code stroke.  After discussing with his wife, however, he has been falling and unsteady and needing to use assistive devices for walking for the past 3 days.  She also notes that he has just been staying in bed, and seeming to G over the past few days.  LKW: 3 days ago tpa given?: no, out of window    ROS: A 14 point ROS was performed and is negative except as noted in the HPI.   Past Medical History:  Diagnosis Date  . Alzheimer disease   . Atrial fibrillation ([place])   . Bipolar 1 disorder ([place])   . Dementia   . ED (erectile dysfunction)   . HLD (hyperlipidemia)   . HTN (hypertension)   . Lipoma of skin   . Other and unspecified hyperlipidemia 03/20/2013  . Poor balance   . Stroke ([place]) 1991  . Stroke ([place])   . Subclinical hypothyroidism   . Tremor      Family History  Problem Relation Age of Onset  . Suicidality    . Cancer    . Alcohol abuse    . Cancer Mother   . Suicidality Father      Social History:  reports that he has never smoked. He has never used smokeless tobacco. He reports that he does not drink alcohol or use drugs.   Exam: Current vital signs: Pulse 64   Resp 19   Wt 82.5 kg (181 lb 14.1 oz)   SpO2 100%   BMI 26.10 kg/m  Vital signs in last 24 hours: Pulse Rate:  [64] 64 (12/10 1839) Resp:  [19] 19 (12/10 1839) SpO2:  [100 %] 100 % (12/10 1839) Weight:  [82.5 kg (181 lb 14.1 oz)] 82.5 kg (181 lb 14.1 oz) (12/10 1839)   Physical Exam  Constitutional: Appears well-developed and well-nourished.  Psych: Affect appropriate to situation Eyes: No  scleral injection HENT: No OP obstrucion Head: Normocephalic.  Cardiovascular: Normal rate and regular rhythm.  Respiratory: Effort normal and breath sounds normal to anterior ascultation GI: Soft.  No distension. There is no tenderness.  Skin: WDI  Neuro: Mental Status: Patient is awake, alert, oriented to person, place, month, year, and situation. Patient is able to give a clear and coherent history. He has some difficulty with naming.  Cranial Nerves: II: Visual Fields are full. Pupils are equal, round, and reactive to light.   III,IV, VI: EOMI without ptosis or diploplia.  he has nystagmus which is right beating on rightward gaze, left being on leftward gaze, right beating on upper gaze  V: Facial sensation is symmetric to temperature VII: Facial movement is symmetric.  VIII: hearing is intact to voice X: Uvula elevates symmetrically XI: Shoulder shrug is symmetric. XII: tongue is midline without atrophy or fasciculations.  Motor: Tone is normal. Bulk is normal. 5/5 strength was present in all four extremities.  Sensory: Sensation is symmetric to light touch and temperature in the arms and legs. Cerebellar: He does not follow commands very well, but does not appear to have ataxia on finger-nose-finger or heel-knee-shin on the right, and very clearly  is ataxic on the left.       I have reviewed labs in epic and the results pertinent to this consult: I-STAT chem 8-borderline low hemoglobin, otherwise unremarkable  I have reviewed the images obtained: CT head-no acute findings   Impression:  70 year old male with 3 days of ataxia and multidirectional nystagmus. This is by far most consistent with acute left cerebellar dysfunction, likely ischemic infarct. He is outside the window for IV TPA.  Recommendations: 1. HgbA1c, fasting lipid panel 2. MRI, MRA  of the brain without contrast 3. Frequent neuro checks 4. Echocardiogram 5. Carotid dopplers 6. Prophylactic therapy-ASA  until MRI, if no large stroke, can resume xarelto.  7. Risk factor modification 8. Telemetry monitoring 9. PT consult, OT consult, Speech consult 10. please page stroke NP  Or  PA  Or MD from 8am -4 pm starting 12/11 as this patient will be followed by the stroke team at this point.   You can look them up on [email or web address]      Roland Rack, MD [facility] [phone]  If 7pm- 7am, please page neurology on call as listed in [place].

## 2016-02-10 NOTE — ED Triage Notes (Signed)
Pt presents to ED via EMS as a code stroke. Per EMS, pt LSW by family at noon today. Per family, pt was upstairs watching the football game when a loud "thump" was heard and family found patient on the floor. When asked what caused his fall, pt reports he felt he "could not move" and was "wobbly". Per EMS, no deficits noted but pt unable to track with his eyes. Per pt's wife, he has had new slurred speech and she reports "we have been dealing with this for 3 days". Hx of dementia, stroke in 1991, and is on Xarelto for A fib. Pt alert and responsive.

## 2016-02-10 NOTE — ED Notes (Signed)
Pt returned from CT °

## 2016-02-11 ENCOUNTER — Encounter (HOSPITAL_COMMUNITY): Payer: Self-pay

## 2016-02-11 DIAGNOSIS — G309 Alzheimer's disease, unspecified: Secondary | ICD-10-CM | POA: Diagnosis not present

## 2016-02-11 DIAGNOSIS — F0150 Vascular dementia without behavioral disturbance: Secondary | ICD-10-CM

## 2016-02-11 DIAGNOSIS — I10 Essential (primary) hypertension: Secondary | ICD-10-CM | POA: Diagnosis not present

## 2016-02-11 DIAGNOSIS — R531 Weakness: Secondary | ICD-10-CM

## 2016-02-11 DIAGNOSIS — I482 Chronic atrial fibrillation: Secondary | ICD-10-CM | POA: Diagnosis not present

## 2016-02-11 DIAGNOSIS — I639 Cerebral infarction, unspecified: Secondary | ICD-10-CM | POA: Diagnosis not present

## 2016-02-11 DIAGNOSIS — F319 Bipolar disorder, unspecified: Secondary | ICD-10-CM | POA: Diagnosis not present

## 2016-02-11 DIAGNOSIS — F0280 Dementia in other diseases classified elsewhere without behavioral disturbance: Secondary | ICD-10-CM

## 2016-02-11 MED ORDER — MEMANTINE HCL 5 MG PO TABS
5.0000 mg | ORAL_TABLET | Freq: Two times a day (BID) | ORAL | Status: DC
  Administered 2016-02-11 – 2016-02-12 (×3): 5 mg via ORAL
  Filled 2016-02-11 (×3): qty 1

## 2016-02-11 MED ORDER — ATORVASTATIN CALCIUM 40 MG PO TABS
40.0000 mg | ORAL_TABLET | Freq: Every day | ORAL | Status: DC
  Administered 2016-02-11 (×2): 40 mg via ORAL
  Filled 2016-02-11 (×2): qty 1

## 2016-02-11 MED ORDER — ACETAMINOPHEN 650 MG RE SUPP: 650.0000 mg | Freq: Four times a day (QID) | RECTAL | Status: DC | PRN

## 2016-02-11 MED ORDER — FOLIC ACID 1 MG PO TABS
1.0000 mg | ORAL_TABLET | Freq: Every day | ORAL | Status: DC
  Administered 2016-02-11 (×2): 1 mg via ORAL
  Filled 2016-02-11 (×2): qty 1

## 2016-02-11 MED ORDER — LISINOPRIL 10 MG PO TABS
10.0000 mg | ORAL_TABLET | Freq: Every day | ORAL | Status: DC
  Administered 2016-02-11 (×2): 10 mg via ORAL
  Filled 2016-02-11 (×2): qty 1

## 2016-02-11 MED ORDER — GABAPENTIN 100 MG PO CAPS
200.0000 mg | ORAL_CAPSULE | Freq: Two times a day (BID) | ORAL | Status: DC
  Administered 2016-02-11 – 2016-02-12 (×4): 200 mg via ORAL
  Filled 2016-02-11 (×4): qty 2

## 2016-02-11 MED ORDER — DONEPEZIL HCL 10 MG PO TABS
10.0000 mg | ORAL_TABLET | Freq: Every day | ORAL | Status: DC
  Administered 2016-02-11 (×2): 10 mg via ORAL
  Filled 2016-02-11 (×2): qty 1

## 2016-02-11 MED ORDER — DIVALPROEX SODIUM ER 500 MG PO TB24
500.0000 mg | ORAL_TABLET | Freq: Two times a day (BID) | ORAL | Status: DC
  Administered 2016-02-11 (×2): 500 mg via ORAL
  Filled 2016-02-11 (×3): qty 1

## 2016-02-11 MED ORDER — ACETAMINOPHEN 325 MG PO TABS: 650.0000 mg | ORAL_TABLET | Freq: Four times a day (QID) | ORAL | Status: DC | PRN

## 2016-02-11 MED ORDER — LAMOTRIGINE 100 MG PO TABS
150.0000 mg | ORAL_TABLET | Freq: Two times a day (BID) | ORAL | Status: DC
  Administered 2016-02-11 – 2016-02-12 (×4): 150 mg via ORAL
  Filled 2016-02-11 (×4): qty 2

## 2016-02-11 NOTE — Care Management Obs Status (Signed)
[place] NOTIFICATION   Patient Details  Name: [name] [name] MRN: [id number] Date of Birth: 09-10-1945   Medicare Observation Status Notification Given:  Yes (MRI negative)    Pollie Friar, RN 02/11/2016, 11:38 AM

## 2016-02-11 NOTE — ED Notes (Signed)
Dr. Loleta Books repeated swallow screen himself with this RN present. Patient assessed and no signs of coughing or aspiration. Patient able to eat and take meds per Dr. Loleta Books. Patient alert and oriented x 4, with hx dementia. Vitals WDL. No deficits noted. NIH 1. MRI negative for stroke. Patient has weakness, plan to do rehab tomorrow.

## 2016-02-11 NOTE — Progress Notes (Signed)
Subjective: Patient currently is sitting up in bed eating breakfast. He is in a happy jovial mood. Patient states that currently he is seeing double vision which is horizontal. When he covers each eye he does not see double vision. She denies any vertigo. Patient also denies any dizziness. Physical therapy is in the room and about to start physical therapy training. Just before starting the exam patient noted that he had triple vision  Exam: Vitals:   02/11/16 0103 02/11/16 0514  BP: (!) 180/102 (!) 178/110  Pulse: 74 65  Resp:  18  Temp: 97.3 F (36.3 C) 98.2 F (36.8 C)    Neuro: Mental Status: Patient is awake, alert, oriented to person, place, month, year, and situation. Patient is able to give a clear and coherent history. He has some difficulty with naming.  Cranial Nerves: II: Visual Fields are full. Pupils are equal, round, and reactive to light.   III,IV, VI: EOMI without ptosis or diploplia.  He continues to have nystagmus which is right beating on rightward gaze, left being on leftward gaze, right beating on upper gaze, no lid lag with prolonged upward gaze  V: Facial sensation is symmetric to temperature VII: Facial movement is symmetric.  VIII: hearing is intact to voice X: Uvula elevates symmetrically XI: Shoulder shrug is symmetric. XII: tongue is midline without atrophy or fasciculations.  Motor: Tone is normal. Bulk is normal. 5/5 strength was present in all four extremities.  Sensory: Sensation is symmetric to light touch and temperature in the arms and legs. Cerebellar: He does not follow commands very well as he is easily distracted and does not want pain attention when asked to do questions., but does not appear to have ataxia on finger-nose-finger or heel-knee-shin on the right, and very clearly is ataxic on the left.       Pertinent Labs/Diagnostics: FINDINGS: Brain: There is no evidence of acute infarct, intracranial hemorrhage, mass, midline shift,  or extra-axial fluid collection. There is mild cerebral atrophy. Scattered T2 hyperintensities in the subcortical and deep cerebral white matter are unchanged from the prior MRI and nonspecific but compatible with mild chronic small vessel ischemic disease. Chronic infarct involving the right midbrain is again noted with unchanged volume loss.  Vascular: Major intracranial vascular flow voids are preserved.  Skull and upper cervical spine: Unremarkable bone marrow signal.  Sinuses/Orbits: Unremarkable orbits. Left maxillary sinus mucous retention cyst. Clear mastoid air cells.  Other: None.  IMPRESSION: 1. No acute intracranial abnormality. 2. Mild chronic small vessel ischemic disease.   Impression: This is a 70 year old male in with 3 day history of ataxia and multidirectional nystagmus. Initial thought was this likely was an ischemic infarct however patient had MRI which showed no acute abnormalities. Patient states that his dizziness/double vision/unsteady gait comes and goes and he cannot state any specific radicular aggravating factors. When asked if the double vision or weakness is worse with prolonged motion or reading a he denies this.   Recommendations: 1) agree with physical therapy.  Etta Quill PA-C [facility] [phone] 02/11/2016, 10:35 AM   I have seen the patient agreed to above note. He continues to have left beating nystagmus on leftward gaze, right beating nystagmus on rightward gaze. Though this could be exaggerated physiological nystagmus, does not clearly fatigue.  He continues to appear to have some ataxia in his left.  Of note, his family and him all agree that these are very similar symptoms to what happened after his previous stroke. He has these  symptoms come and go and have ever since his stroke in 1991.  His wife notes that they typically last 3-4 days.  I wonder if this represents recrudescence of his stroke symptoms, though  no clear inciting agent has been identified.  Of note he does not have a diagnosis of Parkinson's disease, but rather takes Sinemet for restless leg syndrome.  At this time, I have no further recommendations other than physical therapy.  Please call with any further questions or concerns. Neurology will sign off  Roland Rack, MD [facility] [phone]  If 7pm- 7am, please page neurology on call as listed in [place].

## 2016-02-11 NOTE — Progress Notes (Signed)
Patient assisted to the bathroom. He had a bowel movement and assisted back to sit on his  bed.

## 2016-02-11 NOTE — Evaluation (Signed)
Physical Therapy Evaluation Patient Details Name: [name] [name] MRN: [id number] DOB: 1945/11/19 Today's Date: 02/11/2016   History of Present Illness  70 year old male in with 3 day history of ataxia and multidirectional nystagmus. Initial thought was this likely was an ischemic infarct however patient had MRI which showed no acute abnormalities. Patient states that his dizziness/double vision/unsteady gait comes and goes. ?myasthenia per neuro consult PMHx-dementia, bipolar, remote Rt midbrain CVA, afib on anticoagulation, takes Sinemet (?for restless legs; not diagnosed with Parkinson's)    Clinical Impression  Pt admitted with above symptoms; workup pending. Patient remains unsteady on his feet and noted worsening of his balance and "crouched" posture as he fatigued. (After walking 20 ft). Patient currently is a high fall risk. Barriers to discharge include his bedroom/bathroom are on the second level of home. Pt currently with functional limitations due to the deficits listed below (see PT Problem List).  Pt will benefit from skilled PT to increase their independence and safety with mobility to allow discharge to the venue listed below.       Follow Up Recommendations SNF (CIR not an option (observation status))    Equipment Recommendations  None recommended by PT    Recommendations for Other Services OT consult     Precautions / Restrictions Precautions Precautions: Fall Precaution Comments: pt admits he has dementia and recalls only 1 fall in past month      Mobility  Bed Mobility Overal bed mobility: Modified Independent             General bed mobility comments: HOB at 20 (pt has adjustable bed at home)  Transfers Overall transfer level: Needs assistance Equipment used: Rolling walker (2 wheeled) Transfers: Sit to/from Stand Sit to Stand: Min guard         General transfer comment: educated on proper use of RW; minguard for safety with reports of mild  dizziness (denied vertigo) +diplopia  Ambulation/Gait Ambulation/Gait assistance: Min assist Ambulation Distance (Feet): 20 Feet (standing rest; 40) Assistive device: Rolling walker (2 wheeled) Gait Pattern/deviations: Step-through pattern;Decreased stride length;Trunk flexed;Wide base of support Gait velocity: dect Gait velocity interpretation: Below normal speed for age/gender General Gait Details: hip and knee flexion bil; unsteady worsens as he fatigues; vc and facilitation for upright posture (torso and legs) and safe use of RW  Stairs            Wheelchair Mobility    Modified Rankin (Stroke Patients Only)       Balance Overall balance assessment: Needs assistance;History of Falls Sitting-balance support: No upper extremity supported;Feet supported Sitting balance-Leahy Scale: Good Sitting balance - Comments: during LE strength testing   Standing balance support: Bilateral upper extremity supported Standing balance-Leahy Scale: Poor                               Pertinent Vitals/Pain Pain Assessment: No/denies pain    Home Living Family/patient expects to be discharged to:: Private residence Living Arrangements: Spouse/significant other;Children Available Help at Discharge: Family;Available 24 hours/day Type of Home: House Home Access: Stairs to enter   [employer] of Steps: 1 Home Layout: Two level;Bed/bath upstairs Home Equipment: Walker - 2 wheels;Cane - single point;Grab bars - tub/shower (adjustable bed) Additional Comments: information per patient--?accuracy    Prior Function Level of Independence: Independent with assistive device(s)         Comments: decr balance, especially with quick turns; reports he mostly uses a cane and resists  using RW despite encouragement     Hand Dominance   Dominant Hand: Right    Extremity/Trunk Assessment   Upper Extremity Assessment: Defer to OT evaluation (able to utilize RW with bil  UEs)           Lower Extremity Assessment: LLE deficits/detail   LLE Deficits / Details: AROM WFL; hip flexion 4/5, knee extension 4/5, ankle DF 5/5  Cervical / Trunk Assessment: Kyphotic  Communication   Communication: No difficulties  Cognition Arousal/Alertness: Awake/alert Behavior During Therapy: WFL for tasks assessed/performed Overall Cognitive Status: No family/caregiver present to determine baseline cognitive functioning                 General Comments: repeated himself several times during session    General Comments      Exercises     Assessment/Plan    PT Assessment Patient needs continued PT services  PT Problem List Decreased strength;Decreased activity tolerance;Decreased balance;Decreased mobility;Decreased cognition;Decreased knowledge of use of DME;Decreased safety awareness;Decreased knowledge of precautions          PT Treatment Interventions DME instruction;Gait training;Stair training;Functional mobility training;Therapeutic activities;Balance training;Neuromuscular re-education;Cognitive remediation;Patient/family education    PT Goals (Current goals can be found in the Care Plan section)  Acute Rehab PT Goals Patient Stated Goal: find out what is wrong; return home PT Goal Formulation: With patient Time For Goal Achievement: 02/18/16 Potential to Achieve Goals: Good    Frequency Min 4X/week   Barriers to discharge Inaccessible home environment bedroom upstairs    Co-evaluation               End of Session Equipment Utilized During Treatment: Gait belt Activity Tolerance: Patient limited by fatigue Patient left: in chair;with call bell/phone within reach;with chair alarm set Nurse Communication: Mobility status         Time: 1010-1053 PT Time Calculation (min) (ACUTE ONLY): 43 min   Charges:         PT G CodesJeanie Cooks Darcel Frane 03-12-2016, 11:15 AM Pager [phone]

## 2016-02-11 NOTE — Progress Notes (Signed)
Repeat swallow screen passed without problems.

## 2016-02-11 NOTE — Progress Notes (Addendum)
PROGRESS NOTE    [name] [name]  [id number] DOB: June 06, 1945 DOA: 02/10/2016 PCP: Horatio Pel, MD   Outpatient Specialists:     Brief Narrative:  [name] [name] is a 70 y.o. male with a past medical history significant for dementia community dwelling, Bipolar on Depakote and lamotrigine, very remote history of stroke, Afib on AC, a chronic unexplained episodic diplopia and ataxia, HTN, and restless leg syndrome? on Sinemet who presents with movement problems for 1 day.  The patient is a challenging historian because of dementia and interrupting his wife frequently, and wife is better but also a slightly disorganized historian, and so history taking initially difficult.    Previous notes that suggest he had a fall are incorrect, there was no witnessed or suspected fall, verified with family. Strictly interviewing patient then wife then daughter in turn, the following timeline is clear:  -Patient skipped his nightly Sinemet last night because of dementia, and also his dose this morning.   -Woke with gait troubles -Felt he "couldn't walk right", wasn't going to church with a walker, and so went back to bed; family left him there for most of day, watching TV -Patient maybe tried to get up once or twice during the day to go to bathroom, sometimes could, other times couldn't; hard time clarifying why not, other than "I just couldn't move" -Evening time, patient got out of bed (he's not clear how, but didn't fall, there was NO THUMP or fall or witnessed fall) got on his hands and knees, but then couldn't get any further -Because he got stuck on hands and knees on floor he started hollering for family -Daughter and grandson came up and found him on the floor of his bedroom on all fours, daughter went to call 9-1-1, another family member helped him stand with his walker, then EMS transported him. -Before transport he did take an afternoon dose of Sinemet, family think  In brief,  patient was in normal health until he missed two most recent doses of Sinemet. Following that, he had movement and gait difficulties, culminating in the afternoon, him crawling onto the floor to get to the bathroom and getting stuck, then being brought to ER.  There was no unilateral weakness, focal numbness, LOC, confusion, aphasia.  Family were concerned for slurred speech initially.  There was no vertigo, despite what was described earlier.  This was more severe than one of his typical episodes of diplopia and ataxia.  Because of slurred speech, CODE STROKE initially called.   Assessment & Plan:   Principal Problem:   Weakness Active Problems:   Chronic atrial fibrillation (HCC)   Essential hypertension   Bipolar disorder (HCC)   History of stroke   Parkinsonian features   Mixed Alzheimer's and vascular dementia   Difficulty moving:  The patient's MRI rules out stroke  Suspect dehydration vs missed Sinemet. -Fluid bolus -Restart Sinemet -PT eval- SNF   Afib:  CHADS2Vasc 4.  On anticoagulation, no rate control.   -Continue rivaroxaban   HTN and stroke secondary prevention:  Hypertensive at admission. -Continue lisionpril -Continue statin   Bipolar:  Stable. -Continue lamotrigine and Depakote   Dementia:  -Continue donepezil and memantine   Other medications:  -Continue gabapentin (from Neuro, for tremor)   DVT prophylaxis:  SCD's  Code Status: Full Code   Family Communication: Called wife, no answer  Disposition Plan:  SNF   Consultants:  neuro     Subjective: Feeling fine-- says his memory is not  good  Objective: Vitals:   02/11/16 0001 02/11/16 0103 02/11/16 0514 02/11/16 1015  BP: (!) 160/104 (!) 180/102 (!) 178/110 (!) 171/81  Pulse: 66 74 65 (!) 59  Resp: 22  18   Temp:  97.3 F (36.3 C) 98.2 F (36.8 C)   TempSrc:  Oral Oral   SpO2: 95% 100% 100%   Weight:  86.5 kg (190 lb 11.2 oz)    Height:  5\' 10"  (1.778 m)     No intake  or output data in the 24 hours ending 02/11/16 1138 Filed Weights   02/10/16 1839 02/11/16 0103  Weight: 82.5 kg (181 lb 14.1 oz) 86.5 kg (190 lb 11.2 oz)    Examination:  General exam: Appears calm and comfortable  Respiratory system: Clear to auscultation. Respiratory effort normal. Cardiovascular system: S1 & S2 heard, RRR. No JVD, murmurs, rubs, gallops or clicks. No pedal edema. Gastrointestinal system: Abdomen is nondistended, soft and nontender. No organomegaly or masses felt. Normal bowel sounds heard. Central nervous system: Alert and oriented. No focal neurological deficits.     Data Reviewed: I have personally reviewed following labs and imaging studies  CBC:  Recent Labs Lab 02/10/16 1818 02/10/16 1824  WBC 5.6  --   NEUTROABS 3.4  --   HGB 12.3* 12.2*  HCT 37.7* 36.0*  MCV 95.0  --   PLT 143*  --    Basic Metabolic Panel:  Recent Labs Lab 02/10/16 1818 02/10/16 1824  NA 138 140  K 4.0 3.9  CL 104 101  CO2 26  --   GLUCOSE 99 98  BUN 12 13  CREATININE 0.97 0.90  CALCIUM 9.8  --    GFR: Estimated Creatinine Clearance: 76.6 mL/min (by C-G formula based on SCr of 0.9 mg/dL). Liver Function Tests:  Recent Labs Lab 02/10/16 1818  AST 19  ALT <5*  ALKPHOS 74  BILITOT 0.5  PROT 6.0*  ALBUMIN 3.7   No results for input(s): LIPASE, AMYLASE in the last 168 hours. No results for input(s): AMMONIA in the last 168 hours. Coagulation Profile:  Recent Labs Lab 02/10/16 1818  INR 1.45   Cardiac Enzymes: No results for input(s): CKTOTAL, CKMB, CKMBINDEX, TROPONINI in the last 168 hours. BNP (last 3 results) No results for input(s): PROBNP in the last 8760 hours. HbA1C: No results for input(s): HGBA1C in the last 72 hours. CBG: No results for input(s): GLUCAP in the last 168 hours. Lipid Profile: No results for input(s): CHOL, HDL, LDLCALC, TRIG, CHOLHDL, LDLDIRECT in the last 72 hours. Thyroid Function Tests: No results for input(s): TSH,  T4TOTAL, FREET4, T3FREE, THYROIDAB in the last 72 hours. Anemia Panel: No results for input(s): VITAMINB12, FOLATE, FERRITIN, TIBC, IRON, RETICCTPCT in the last 72 hours. Urine analysis:    Component Value Date/Time   COLORURINE YELLOW 03/17/2013 2048   APPEARANCEUR CLEAR 03/17/2013 2048   LABSPEC 1.012 03/17/2013 2048   PHURINE 8.0 03/17/2013 2048   GLUCOSEU NEGATIVE 03/17/2013 2048   HGBUR NEGATIVE 03/17/2013 2048   BILIRUBINUR NEGATIVE 03/17/2013 2048   KETONESUR 15 (A) 03/17/2013 2048   PROTEINUR NEGATIVE 03/17/2013 2048   UROBILINOGEN 0.2 03/17/2013 2048   NITRITE NEGATIVE 03/17/2013 2048   LEUKOCYTESUR NEGATIVE 03/17/2013 2048     )No results found for this or any previous visit (from the past 240 hour(s)).    Anti-infectives    None       Radiology Studies: Mr Brain Wo Contrast  Result Date: 02/10/2016 CLINICAL DATA:  Ataxia, nystagmus, and  diplopia. Symptoms for 3 days. EXAM: MRI HEAD WITHOUT CONTRAST TECHNIQUE: Multiplanar, multiecho pulse sequences of the brain and surrounding structures were obtained without intravenous contrast. COMPARISON:  Head CT 02/10/2016 and MRI 03/17/2013 FINDINGS: Brain: There is no evidence of acute infarct, intracranial hemorrhage, mass, midline shift, or extra-axial fluid collection. There is mild cerebral atrophy. Scattered T2 hyperintensities in the subcortical and deep cerebral white matter are unchanged from the prior MRI and nonspecific but compatible with mild chronic small vessel ischemic disease. Chronic infarct involving the right midbrain is again noted with unchanged volume loss. Vascular: Major intracranial vascular flow voids are preserved. Skull and upper cervical spine: Unremarkable bone marrow signal. Sinuses/Orbits: Unremarkable orbits. Left maxillary sinus mucous retention cyst. Clear mastoid air cells. Other: None. IMPRESSION: 1. No acute intracranial abnormality. 2. Mild chronic small vessel ischemic disease. Electronically  Signed   By: Logan Bores M.D.   On: 02/10/2016 20:48   Ct Head Code Stroke W/o Cm  Result Date: 02/10/2016 CLINICAL DATA:  Code stroke.  Slurred speech. EXAM: CT HEAD WITHOUT CONTRAST TECHNIQUE: Contiguous axial images were obtained from the base of the skull through the vertex without intravenous contrast. COMPARISON:  Brain MRI and CT 03/17/2013 FINDINGS: Brain: There is no evidence of acute cortical infarct, intracranial hemorrhage, mass, midline shift, or extra-axial fluid collection. There is mild cerebral atrophy. Volume loss in the right cerebral peduncle is unchanged and correlates to the previously described remote infarct. Cerebral white matter hypodensities are nonspecific but compatible with mild chronic small vessel ischemic disease. Vascular: No hyperdense vessel or unexpected calcification. Skull: No fracture or focal osseous lesion. Sinuses/Orbits: Left maxillary sinus mucous retention cyst. Clear mastoid air cells. Unremarkable orbits. Other: None. ASPECTS [facility] Stroke Program Early CT Score) - Ganglionic level infarction (caudate, lentiform nuclei, internal capsule, insula, M1-M3 cortex): 7 - Supraganglionic infarction (M4-M6 cortex): 3 Total score (0-10 with 10 being normal): 10 IMPRESSION: 1. No evidence of acute intracranial abnormality. 2. ASPECTS is 10. 3. Mild chronic small vessel ischemic disease. These results were called by telephone at the time of interpretation on 02/10/2016 at 6:30 pm to Dr. Leonel Ramsay, who verbally acknowledged these results. Electronically Signed   By: Logan Bores M.D.   On: 02/10/2016 18:33        Scheduled Meds: . atorvastatin  40 mg Oral QHS  . carbidopa-levodopa  1 tablet Oral BID  . divalproex  500 mg Oral BID  . donepezil  10 mg Oral QHS  . folic acid  1 mg Oral QHS  . gabapentin  200 mg Oral BID  . lamoTRIgine  150 mg Oral BID  . lisinopril  10 mg Oral QHS  . memantine  5 mg Oral BID  . rivaroxaban  20 mg Oral Q supper  . sodium  chloride  500 mL Intravenous Once   Continuous Infusions:   LOS: 0 days    Time spent: 25 min    Garrett, DO [facility] Pager [phone]  If 7PM-7AM, please contact night-coverage [email or web address] Password [facility] 02/11/2016, 11:38 AM

## 2016-02-11 NOTE — Care Management Note (Signed)
Case Management Note  Patient Details  Name: [name] [name] MRN: [id number] Date of Birth: 10-21-1945  Subjective/Objective:    Patient presented with weakness. Lives at home with wife.  Will follow for discharge needs pending PT/OT evals and physician orders.           Action/Plan:   Expected Discharge Date:                  Expected Discharge Plan:     In-House Referral:     Discharge planning Services     Post Acute Care Choice:    Choice offered to:     DME Arranged:    DME Agency:     HH Arranged:    HH Agency:     Status of Service:     If discussed at [employer] of Stay Meetings, dates discussed:    Additional Comments:  Rolm Baptise, RN 02/11/2016, 3:04 PM

## 2016-02-12 ENCOUNTER — Encounter: Payer: Self-pay | Admitting: *Deleted

## 2016-02-12 ENCOUNTER — Ambulatory Visit: Payer: Medicare Other | Admitting: Cardiovascular Disease

## 2016-02-12 DIAGNOSIS — F319 Bipolar disorder, unspecified: Secondary | ICD-10-CM | POA: Diagnosis not present

## 2016-02-12 DIAGNOSIS — J341 Cyst and mucocele of nose and nasal sinus: Secondary | ICD-10-CM | POA: Diagnosis not present

## 2016-02-12 DIAGNOSIS — Z91040 Latex allergy status: Secondary | ICD-10-CM | POA: Diagnosis not present

## 2016-02-12 DIAGNOSIS — R2681 Unsteadiness on feet: Secondary | ICD-10-CM | POA: Diagnosis not present

## 2016-02-12 DIAGNOSIS — R531 Weakness: Secondary | ICD-10-CM | POA: Diagnosis not present

## 2016-02-12 DIAGNOSIS — R278 Other lack of coordination: Secondary | ICD-10-CM | POA: Diagnosis not present

## 2016-02-12 DIAGNOSIS — E785 Hyperlipidemia, unspecified: Secondary | ICD-10-CM | POA: Diagnosis not present

## 2016-02-12 DIAGNOSIS — R296 Repeated falls: Secondary | ICD-10-CM | POA: Diagnosis not present

## 2016-02-12 DIAGNOSIS — I10 Essential (primary) hypertension: Secondary | ICD-10-CM | POA: Diagnosis not present

## 2016-02-12 DIAGNOSIS — E039 Hypothyroidism, unspecified: Secondary | ICD-10-CM | POA: Diagnosis not present

## 2016-02-12 DIAGNOSIS — M6281 Muscle weakness (generalized): Secondary | ICD-10-CM | POA: Diagnosis not present

## 2016-02-12 DIAGNOSIS — R2689 Other abnormalities of gait and mobility: Secondary | ICD-10-CM | POA: Diagnosis not present

## 2016-02-12 DIAGNOSIS — H532 Diplopia: Secondary | ICD-10-CM | POA: Diagnosis not present

## 2016-02-12 DIAGNOSIS — Z7901 Long term (current) use of anticoagulants: Secondary | ICD-10-CM | POA: Diagnosis not present

## 2016-02-12 DIAGNOSIS — R4781 Slurred speech: Secondary | ICD-10-CM | POA: Diagnosis not present

## 2016-02-12 DIAGNOSIS — I6782 Cerebral ischemia: Secondary | ICD-10-CM | POA: Diagnosis not present

## 2016-02-12 DIAGNOSIS — H5500 Unspecified nystagmus: Secondary | ICD-10-CM | POA: Diagnosis not present

## 2016-02-12 DIAGNOSIS — I482 Chronic atrial fibrillation: Secondary | ICD-10-CM | POA: Diagnosis not present

## 2016-02-12 DIAGNOSIS — Z8673 Personal history of transient ischemic attack (TIA), and cerebral infarction without residual deficits: Secondary | ICD-10-CM | POA: Diagnosis not present

## 2016-02-12 DIAGNOSIS — R270 Ataxia, unspecified: Secondary | ICD-10-CM | POA: Diagnosis not present

## 2016-02-12 DIAGNOSIS — G2581 Restless legs syndrome: Secondary | ICD-10-CM | POA: Diagnosis not present

## 2016-02-12 DIAGNOSIS — G20 Parkinson's disease: Secondary | ICD-10-CM | POA: Diagnosis not present

## 2016-02-12 DIAGNOSIS — R259 Unspecified abnormal involuntary movements: Secondary | ICD-10-CM | POA: Diagnosis not present

## 2016-02-12 DIAGNOSIS — G309 Alzheimer's disease, unspecified: Secondary | ICD-10-CM | POA: Diagnosis not present

## 2016-02-12 LAB — BASIC METABOLIC PANEL
Anion gap: 8 (ref 5–15)
BUN: 13 mg/dL (ref 6–20)
CO2: 28 mmol/L (ref 22–32)
Calcium: 9.4 mg/dL (ref 8.9–10.3)
Chloride: 101 mmol/L (ref 101–111)
Creatinine, Ser: 0.96 mg/dL (ref 0.61–1.24)
GFR calc non Af Amer: >60 mL/min (ref >60)
GLUCOSE: 91 mg/dL (ref 65–99)
POTASSIUM: 4 mmol/L (ref 3.5–5.1)
Sodium: 137 mmol/L (ref 135–145)

## 2016-02-12 LAB — CBC
HEMATOCRIT: 37.9 % — AB (ref 39.0–52.0)
HEMOGLOBIN: 12.2 g/dL — AB (ref 13.0–17.0)
MCH: 30.7 pg (ref 26.0–34.0)
MCHC: 32.2 g/dL (ref 30.0–36.0)
MCV: 95.5 fL (ref 78.0–100.0)
Platelets: 130 10*3/uL — ABNORMAL LOW (ref 150–400)
RBC: 3.97 MIL/uL — AB (ref 4.22–5.81)
RDW: 12.8 % (ref 11.5–15.5)
WBC: 6.3 10*3/uL (ref 4.0–10.5)

## 2016-02-12 MED ORDER — DIVALPROEX SODIUM ER 500 MG PO TB24
500.0000 mg | ORAL_TABLET | Freq: Every day | ORAL | Status: DC
  Filled 2016-02-12: qty 1

## 2016-02-12 MED ORDER — DIVALPROEX SODIUM ER 250 MG PO TB24: 250.0000 mg | ORAL_TABLET | Freq: Every day | ORAL | Status: DC

## 2016-02-12 MED ORDER — DIVALPROEX SODIUM ER 250 MG PO TB24
250.0000 mg | ORAL_TABLET | Freq: Every day | ORAL | Status: DC
  Administered 2016-02-12: 250 mg via ORAL
  Filled 2016-02-12: qty 1

## 2016-02-12 MED ORDER — DIVALPROEX SODIUM ER 500 MG PO TB24: 500.0000 mg | ORAL_TABLET | Freq: Every day | ORAL | Status: DC

## 2016-02-12 NOTE — Clinical Social Work Note (Deleted)
Clinical Social Work Assessment  Patient Details  Name: [name] [name] MRN: [id number] Date of Birth: [birth year]  Date of referral:  02/11/16               Reason for consult:  Facility Placement                Permission sought to share information with:  Family Supports Permission granted to share information::  Yes, Verbal Permission Granted  Name::     [name] [place] and [place]::     Relationship::  Wife and daughter  Sport and exercise psychologist Information:  [phone] (Home wife) and [phone] (mobile-daughter)  Housing/Transportation Living arrangements for the past 2 months:  [place] of Information:  Patient, Adult Children, Spouse Patient Interpreter Needed:  None Criminal Activity/Legal Involvement Pertinent to Current Situation/Hospitalization:  No - Comment as needed Significant Relationships:  Adult Children, Spouse Lives with:  Spouse. Son [name] also lives in the home.  Do you feel safe going back to the place where you live?  Yes (Patient feels safe at home but is in agreement that ST rehab needed as wife cannot provide needed care at home) Need for family participation in patient care:  Yes (Comment)  Care giving concerns:  Patient/family agreement that ST rehab will be best at discharge as wife will be unable to care for patient at discharge.   Social Worker assessment / plan:  CSW talked with patient/family at the bedside on 02/11/16 regarding discharge disposition on 12/11 at the bedside. Patient in agreement with ST rehab and CSW informed that patient has been to a skilled facility before for rehab. CSW provided with facility preferences: [facility] and [facility].  Employment status:  Retired Forensic scientist:  Biomedical scientist) PT Recommendations:  [place] / Referral to community resources:  [place] (Family provided with SNF list for [facility])  Patient/Family's Response to care:  No concerns given regarding care during hospitalization.  Patient/Family's Understanding of and Emotional Response to Diagnosis, Current Treatment, and Prognosis: Not discussed.  Emotional Assessment Appearance:  Appears stated age Attitude/Demeanor/Rapport:  Other (Appropriate) Affect (typically observed):  Pleasant, Appropriate Orientation:  Oriented to Self, Oriented to Place, Oriented to  Time Alcohol / Substance use:   (Patient reported that he does not smoke, drink or use illicit drugs) Psych involvement (Current and /or in the community):  No (Comment)  Discharge Needs  Concerns to be addressed:  Discharge Planning Concerns Readmission within the last 30 days:  No Current discharge risk:  None Barriers to Discharge:  No Barriers Identified   Sable Feil, LCSW 02/12/2016, 5:55 PM

## 2016-02-12 NOTE — NC FL2 (Signed)
[place] MEDICAID FL2 LEVEL OF CARE SCREENING TOOL     IDENTIFICATION  Patient Name: [name] [name] Birthdate: [birth year] Sex: male Admission Date (Current Location): 02/10/2016  [facility] [place] Number:  Herbalist and Address:  [facility], [place] [address], [place], [place] [address]      Provider Number: [id number]  Attending Physician Name and Address:  Geradine Girt, DO  Relative Name and Phone Number:  [place], [place];  [phone] (home),  [phone] (mobile)     Current Level of Care: Hospital Recommended Level of Care: [place] Prior Approval Number:    Date Approved/Denied:   PASRR Number: [id number]  Discharge Plan: SNF    Current Diagnoses: Patient Active Problem List   Diagnosis Date Noted  . Weakness 02/10/2016  . Mixed Alzheimer's and vascular dementia 10/28/2013  . Parkinsonian features 03/31/2013  . History of stroke 03/23/2013  . Bipolar disorder ([place]) 03/20/2013  . Other and unspecified hyperlipidemia 03/20/2013  . Brain TIA 03/18/2013  . Gait instability 03/17/2013  . Chronic atrial fibrillation ([place]) 03/17/2013  . Essential hypertension 03/17/2013    Orientation RESPIRATION BLADDER Height & Weight     Self, Time, Place  Normal Continent Weight: 190 lb 11.2 oz (86.5 kg) Height:  5\' 10"  (177.8 cm)  BEHAVIORAL SYMPTOMS/MOOD NEUROLOGICAL BOWEL NUTRITION STATUS      Continent Diet (Low sodium - heart healthy)  AMBULATORY STATUS COMMUNICATION OF NEEDS Skin   Limited Assist (Min. assist per PT) Verbally Normal                       Personal Care Assistance Level of Assistance  Bathing, Feeding, Dressing Bathing Assistance: Limited assistance Feeding assistance: Independent Dressing Assistance: Limited assistance     Functional Limitations Info  Sight, Hearing, Speech Sight Info: Impaired (wears glasses) Hearing Info: Adequate Speech Info: Adequate    SPECIAL CARE  FACTORS FREQUENCY  PT (By licensed PT)     PT Frequency: Evaluated 12/11 and a minimum of 4X per week therapy recommended              Contractures Contractures Info: Not present    Additional Factors Info  Code Status, Allergies Code Status Info: Full Allergies Info: Latex           Current Medications (02/12/2016):  This is the current hospital active medication list Current Facility-Administered Medications  Medication Dose Route Frequency Provider Last Rate Last Dose  . acetaminophen (TYLENOL) tablet 650 mg  650 mg Oral Q6H PRN Edwin Dada, MD       Or  . acetaminophen (TYLENOL) suppository 650 mg  650 mg Rectal Q6H PRN Edwin Dada, MD      . atorvastatin (LIPITOR) tablet 40 mg  40 mg Oral QHS Edwin Dada, MD   40 mg at 02/11/16 2125  . carbidopa-levodopa (SINEMET IR) 25-250 MG per tablet immediate release 1 tablet  1 tablet Oral BID Edwin Dada, MD   1 tablet at 02/12/16 1014  . divalproex (DEPAKOTE ER) 24 hr tablet 250 mg  250 mg Oral Daily Geradine Girt, DO   250 mg at 02/12/16 0932   And  . divalproex (DEPAKOTE ER) 24 hr tablet 500 mg  500 mg Oral QHS Jessica U Vann, DO      . donepezil (ARICEPT) tablet 10 mg  10 mg Oral QHS Edwin Dada, MD   10 mg at 02/11/16 2126  .  folic acid (FOLVITE) tablet 1 mg  1 mg Oral QHS Edwin Dada, MD   1 mg at 02/11/16 2126  . gabapentin (NEURONTIN) capsule 200 mg  200 mg Oral BID Edwin Dada, MD   200 mg at 02/12/16 0930  . lamoTRIgine (LAMICTAL) tablet 150 mg  150 mg Oral BID Edwin Dada, MD   150 mg at 02/12/16 0932  . lisinopril (PRINIVIL,ZESTRIL) tablet 10 mg  10 mg Oral QHS Edwin Dada, MD   10 mg at 02/11/16 2126  . memantine (NAMENDA) tablet 5 mg  5 mg Oral BID Edwin Dada, MD   5 mg at 02/12/16 0931  . rivaroxaban (XARELTO) tablet 20 mg  20 mg Oral Q supper Edwin Dada, MD   20 mg at 02/11/16 1645  . sodium chloride 0.9  % bolus 500 mL  500 mL Intravenous Once Edwin Dada, MD         Discharge Medications: Please see discharge summary for a list of discharge medications.  Relevant Imaging Results:  Relevant Lab Results:   Additional Information [phone]  Sable Feil, LCSW

## 2016-02-12 NOTE — Progress Notes (Signed)
Occupational Therapy Evaluation Patient Details Name: [name] [name] MRN: [id number] DOB: 10-May-1945 Today's Date: 02/12/2016    History of Present Illness 70 year old male in with 3 day history of ataxia and multidirectional nystagmus. Initial thought was this likely was an ischemic infarct however patient had MRI which showed no acute abnormalities. Patient states that his dizziness/double vision/unsteady gait comes and goes. ?myasthenia per neuro consult PMHx-dementia, bipolar, remote Rt midbrain CVA, afib on anticoagulation, takes Sinemet (?for restless legs; not diagnosed with Parkinson's)   Clinical Impression   Pt currently fall risk and requires transfer upstairs for sleeping at home . Daughter present for evaluation and agreeable to SNF placement. Pt with cognitive deficits and requires cues from daughter during session. OT to follow acutely.  Pt will benefit from skilled OT to increase their independence and safety with adls and balance to allow discharge SNF.     Follow Up Recommendations  SNF    Equipment Recommendations  None recommended by OT    Recommendations for Other Services       Precautions / Restrictions Precautions Precautions: Fall Precaution Comments: hx of diplopia per daughter       Mobility Bed Mobility               General bed mobility comments: in chair on arrival  Transfers Overall transfer level: Needs assistance Equipment used: Rolling walker (2 wheeled) Transfers: Sit to/from Stand Sit to Stand: Min guard         General transfer comment: needed cues to use RW    Balance Overall balance assessment: Needs assistance Sitting-balance support: No upper extremity supported;Feet supported Sitting balance-Leahy Scale: Good     Standing balance support: Bilateral upper extremity supported;During functional activity Standing balance-Leahy Scale: Poor                              ADL Overall ADL's : Needs  assistance/impaired Eating/Feeding: Modified independent;Sitting   Grooming: Wash/dry face;Wash/dry hands;Set up;Sitting   Upper Body Bathing: Minimal assistance   Lower Body Bathing: Minimal assistance           Toilet Transfer: Minimal assistance Toilet Transfer Details (indicate cue type and reason): pt requires bil UE use         Functional mobility during ADLs: Minimal assistance General ADL Comments: pt demonstrates tremor with bil UE after ambulation ~75 feet and states "i am a little shaking" pt states he does not like using RW but realizes he needs to just do it right now. pt reports diplopia as vertical in near and far sight. Pt with monocular vision with occlusion. pt coudl benefit from use of a patch with ambulation. pt reports vision diplopia comes and goes. pt without diplopia during session and then sudden on set then resolves     Vision Vision Assessment?: Yes Diplopia Assessment: Objects split on top of one another;Present in near gaze;Present in far gaze;Present all the time/all directions   Perception     Praxis      Pertinent Vitals/Pain Pain Assessment: No/denies pain     Hand Dominance Right   Extremity/Trunk Assessment Upper Extremity Assessment Upper Extremity Assessment: Overall WFL for tasks assessed   Lower Extremity Assessment Lower Extremity Assessment: Defer to PT evaluation   Cervical / Trunk Assessment Cervical / Trunk Assessment: Kyphotic   Communication Communication Communication: No difficulties   Cognition Arousal/Alertness: Awake/alert Behavior During Therapy: WFL for tasks assessed/performed Overall Cognitive Status: History of  cognitive impairments - at baseline                 General Comments: pt needs redireciton to task and talking about various topics. pt repeating information multiple times to therapist. Pt no recall of SNF recommendation and daughter attempting to educate. Pt fixated on being home for grandson  basketball game and attending it   General Comments       Exercises       Shoulder Instructions      Home Living Family/patient expects to be discharged to:: Private residence Living Arrangements: Spouse/significant other;Children (adult son) Available Help at Discharge: Family;Available 24 hours/day Type of Home: House Home Access: Stairs to enter [employer] of Steps: 1   Home Layout: Two level;Bed/bath upstairs Alternate Level Stairs-Number of Steps: 10 Alternate Level Stairs-Rails: Right;Left;Can reach both Bathroom Shower/Tub: Occupational psychologist: Standard Bathroom Accessibility: Yes   Home Equipment: Environmental consultant - 2 wheels;Cane - single point;Grab bars - tub/shower   Additional Comments: daughter confirms that bedroom is upstairs and wife is available at all times if needed.       Prior Functioning/Environment Level of Independence: Independent with assistive device(s)        Comments: decr balance, especially with quick turns; reports he mostly uses a cane and resists using RW despite encouragement        OT Problem List: Decreased strength;Decreased activity tolerance;Impaired balance (sitting and/or standing);Decreased cognition;Decreased safety awareness;Decreased knowledge of use of DME or AE;Decreased knowledge of precautions   OT Treatment/Interventions: Self-care/ADL training;Therapeutic exercise;Neuromuscular education;DME and/or AE instruction;Therapeutic activities;Cognitive remediation/compensation;Visual/perceptual remediation/compensation;Patient/family education;Balance training    OT Goals(Current goals can be found in the care plan section) Acute Rehab OT Goals Patient Stated Goal: to see grandson basketball game OT Goal Formulation: With patient/family Time For Goal Achievement: 02/26/16 Potential to Achieve Goals: Good  OT Frequency: Min 2X/week   Barriers to D/C:            Co-evaluation              End of  Session Equipment Utilized During Treatment: Gait belt;Rolling walker Nurse Communication: Mobility status;Precautions  Activity Tolerance: Patient tolerated treatment well Patient left: in chair;with call bell/phone within reach;with chair alarm set;with family/visitor present   Time: [id number] OT Time Calculation (min): 26 min Charges:  OT General Charges $OT Visit: 1 Procedure OT Evaluation $OT Eval Moderate Complexity: 1 Procedure OT Treatments $Therapeutic Activity: 8-22 mins G-Codes: OT G-codes **NOT FOR INPATIENT CLASS** Functional Assessment Tool Used: clincial judgement Functional Limitation: Self care Self Care Current Status [id number]): At least 40 percent but less than 60 percent impaired, limited or restricted Self Care Goal Status [id number]): At least 20 percent but less than 40 percent impaired, limited or restricted  Parke Poisson B 02/12/2016, 3:29 PM    Jeri Modena   OTR/L Pager: [phone] Office: [phone] .

## 2016-02-12 NOTE — Care Management (Signed)
CM met with the patient and his daughter. She is concerned about his care at home. Daughter interested in rehab at discharge. Daughter also interested in having [facility] services in the home post rehab. Daughter asking to speak with CSW. CSW informed of possible rehab placement and daughters request.

## 2016-02-12 NOTE — Progress Notes (Signed)
Patient given discharge instructions all questions and answers addressed.  IV catheter removed without difficulty.  Patient left unit with all belongings.

## 2016-02-12 NOTE — Progress Notes (Signed)
PT Cancellation Note  Patient Details Name: [name] [name] MRN: [id number] DOB: 15-Aug-1945   Cancelled Treatment:    Reason Eval/Treat Not Completed: Fatigue/lethargy limiting ability to participate   Patient sleeping soundly on arrival. Daughter present and reports he just fell asleep and requested PT be deferred to allow him to rest. Daughter with several questions re: therapies at SNF and answered as able. Patient did not awaken while we were talking and PT deferred at this time. Noted patient to be transported to SNF later today. Will attempt to see patient today as schedule permits.   Jeanie Cooks Chriss Redel 02/12/2016, 1:23 PM Pager [phone]

## 2016-02-12 NOTE — Progress Notes (Signed)
Call to [facility] place, unable to give report due to staff unable to answer phone call.

## 2016-02-12 NOTE — Discharge Summary (Signed)
Physician Discharge Summary  [name] [name] [id number] DOB: 1946/01/30 DOA: 02/10/2016  PCP: Horatio Pel, MD  Admit date: 02/10/2016 Discharge date: 02/12/2016   Recommendations for Outpatient Follow-Up:   1. SNF for short term rehab-- upon d/c neither patient nor his wife should be managing medications   Discharge Diagnosis:   Principal Problem:   Weakness Active Problems:   Chronic atrial fibrillation (HCC)   Essential hypertension   Bipolar disorder (HCC)   History of stroke   Parkinsonian features   Mixed Alzheimer's and vascular dementia   Discharge disposition:  SNF:  Discharge Condition: Improved.  Diet recommendation: Low sodium, heart healthy.  Wound care: None.   History of Present Illness:   [name] [name] is a 70 y.o. male with a past medical history significant for dementia community dwelling, Bipolar on Depakote and lamotrigine, very remote history of stroke, Afib on AC, a chronic unexplained episodic diplopia and ataxia, HTN, and restless leg syndrome? on Sinemet who presents with movement problems for 1 day.  The patient is a challenging historian because of dementia and interrupting his wife frequently, and wife is better but also a slightly disorganized historian, and so history taking initially difficult.    Previous notes that suggest he had a fall are incorrect, there was no witnessed or suspected fall, verified with family. Strictly interviewing patient then wife then daughter in turn, the following timeline is clear:  -Patient skipped his nightly Sinemet last night because of dementia, and also his dose this morning.   -Woke with gait troubles -Felt he "couldn't walk right", wasn't going to church with a walker, and so went back to bed; family left him there for most of day, watching TV -Patient maybe tried to get up once or twice during the day to go to bathroom, sometimes could, other times couldn't; hard time clarifying why  not, other than "I just couldn't move" -Evening time, patient got out of bed (he's not clear how, but didn't fall, there was NO THUMP or fall or witnessed fall) got on his hands and knees, but then couldn't get any further -Because he got stuck on hands and knees on floor he started hollering for family -Daughter and grandson came up and found him on the floor of his bedroom on all fours, daughter went to call 9-1-1, another family member helped him stand with his walker, then EMS transported him. -Before transport he did take an afternoon dose of Sinemet, family think  In brief, patient was in normal health until he missed two most recent doses of Sinemet. Following that, he had movement and gait difficulties, culminating in the afternoon, him crawling onto the floor to get to the bathroom and getting stuck, then being brought to ER.  There was no unilateral weakness, focal numbness, LOC, confusion, aphasia.  Family were concerned for slurred speech initially.  There was no vertigo, despite what was described earlier.  This was more severe than one of his typical episodes of diplopia and ataxia.  Because of slurred speech, CODE STROKE initially called.   Hospital Course by Problem:   Difficulty moving: The patient's MRI rules out stroke  Suspect dehydration vs missed Sinemet/incorrect medications -Restart Sinemet -PT eval- SNF   Afib: CHADS2Vasc 4. On anticoagulation, no rate control.  -Continue rivaroxaban   HTN -Continue lisionpril -Continue statin   Bipolar: Stable. -Continue lamotrigine and Depakote   Dementia: -Continue donepezil and memantine   Other medications: -Continue gabapentin (from Neuro, for tremor)  Medical Consultants:    Neuro   Discharge Exam:   Vitals:   02/12/16 0049 02/12/16 0512  BP: 131/72 138/70  Pulse: 66 69  Resp: 18 18  Temp: 98.3 F (36.8 C) 98.1 F (36.7 C)   Vitals:   02/11/16 1844 02/11/16 2112 02/12/16 0049  02/12/16 0512  BP: 133/70 139/68 131/72 138/70  Pulse: 63 66 66 69  Resp: 17 18 18 18   Temp: 98.3 F (36.8 C) 98.1 F (36.7 C) 98.3 F (36.8 C) 98.1 F (36.7 C)  TempSrc: Oral Oral Oral Oral  SpO2: 98% 98% 98% 98%  Weight:      Height:        Gen:  NAD-- pleasantly confused   The results of significant diagnostics from this hospitalization (including imaging, microbiology, ancillary and laboratory) are listed below for reference.     Procedures and Diagnostic Studies:   Mr Brain Wo Contrast  Result Date: 02/10/2016 CLINICAL DATA:  Ataxia, nystagmus, and diplopia. Symptoms for 3 days. EXAM: MRI HEAD WITHOUT CONTRAST TECHNIQUE: Multiplanar, multiecho pulse sequences of the brain and surrounding structures were obtained without intravenous contrast. COMPARISON:  Head CT 02/10/2016 and MRI 03/17/2013 FINDINGS: Brain: There is no evidence of acute infarct, intracranial hemorrhage, mass, midline shift, or extra-axial fluid collection. There is mild cerebral atrophy. Scattered T2 hyperintensities in the subcortical and deep cerebral white matter are unchanged from the prior MRI and nonspecific but compatible with mild chronic small vessel ischemic disease. Chronic infarct involving the right midbrain is again noted with unchanged volume loss. Vascular: Major intracranial vascular flow voids are preserved. Skull and upper cervical spine: Unremarkable bone marrow signal. Sinuses/Orbits: Unremarkable orbits. Left maxillary sinus mucous retention cyst. Clear mastoid air cells. Other: None. IMPRESSION: 1. No acute intracranial abnormality. 2. Mild chronic small vessel ischemic disease. Electronically Signed   By: Logan Bores M.D.   On: 02/10/2016 20:48   Ct Head Code Stroke W/o Cm  Result Date: 02/10/2016 CLINICAL DATA:  Code stroke.  Slurred speech. EXAM: CT HEAD WITHOUT CONTRAST TECHNIQUE: Contiguous axial images were obtained from the base of the skull through the vertex without intravenous  contrast. COMPARISON:  Brain MRI and CT 03/17/2013 FINDINGS: Brain: There is no evidence of acute cortical infarct, intracranial hemorrhage, mass, midline shift, or extra-axial fluid collection. There is mild cerebral atrophy. Volume loss in the right cerebral peduncle is unchanged and correlates to the previously described remote infarct. Cerebral white matter hypodensities are nonspecific but compatible with mild chronic small vessel ischemic disease. Vascular: No hyperdense vessel or unexpected calcification. Skull: No fracture or focal osseous lesion. Sinuses/Orbits: Left maxillary sinus mucous retention cyst. Clear mastoid air cells. Unremarkable orbits. Other: None. ASPECTS [facility] Stroke Program Early CT Score) - Ganglionic level infarction (caudate, lentiform nuclei, internal capsule, insula, M1-M3 cortex): 7 - Supraganglionic infarction (M4-M6 cortex): 3 Total score (0-10 with 10 being normal): 10 IMPRESSION: 1. No evidence of acute intracranial abnormality. 2. ASPECTS is 10. 3. Mild chronic small vessel ischemic disease. These results were called by telephone at the time of interpretation on 02/10/2016 at 6:30 pm to Dr. Leonel Ramsay, who verbally acknowledged these results. Electronically Signed   By: Logan Bores M.D.   On: 02/10/2016 18:33     Labs:   Basic Metabolic Panel:  Recent Labs Lab 02/10/16 1818 02/10/16 1824 02/12/16 0258  NA 138 140 137  K 4.0 3.9 4.0  CL 104 101 101  CO2 26  --  28  GLUCOSE 99 98 91  BUN 12 13 13   CREATININE 0.97 0.90 0.96  CALCIUM 9.8  --  9.4   GFR Estimated Creatinine Clearance: 71.8 mL/min (by C-G formula based on SCr of 0.96 mg/dL). Liver Function Tests:  Recent Labs Lab 02/10/16 1818  AST 19  ALT <5*  ALKPHOS 74  BILITOT 0.5  PROT 6.0*  ALBUMIN 3.7   No results for input(s): LIPASE, AMYLASE in the last 168 hours. No results for input(s): AMMONIA in the last 168 hours. Coagulation profile  Recent Labs Lab 02/10/16 1818  INR 1.45     CBC:  Recent Labs Lab 02/10/16 1818 02/10/16 1824 02/12/16 0258  WBC 5.6  --  6.3  NEUTROABS 3.4  --   --   HGB 12.3* 12.2* 12.2*  HCT 37.7* 36.0* 37.9*  MCV 95.0  --  95.5  PLT 143*  --  130*   Cardiac Enzymes: No results for input(s): CKTOTAL, CKMB, CKMBINDEX, TROPONINI in the last 168 hours. BNP: Invalid input(s): POCBNP CBG: No results for input(s): GLUCAP in the last 168 hours. D-Dimer No results for input(s): DDIMER in the last 72 hours. Hgb A1c No results for input(s): HGBA1C in the last 72 hours. Lipid Profile No results for input(s): CHOL, HDL, LDLCALC, TRIG, CHOLHDL, LDLDIRECT in the last 72 hours. Thyroid function studies No results for input(s): TSH, T4TOTAL, T3FREE, THYROIDAB in the last 72 hours.  Invalid input(s): FREET3 Anemia work up No results for input(s): VITAMINB12, FOLATE, FERRITIN, TIBC, IRON, RETICCTPCT in the last 72 hours. Microbiology No results found for this or any previous visit (from the past 240 hour(s)).   Discharge Instructions:   Discharge Instructions    Diet - low sodium heart healthy    Complete by:  As directed    Increase activity slowly    Complete by:  As directed        Medication List    TAKE these medications   atorvastatin 40 MG tablet Commonly known as:  LIPITOR Take 40 mg by mouth at bedtime.   carbidopa-levodopa 25-250 MG tablet Commonly known as:  SINEMET IR Take 1 tablet by mouth 2 (two) times daily.   divalproex 250 MG 24 hr tablet Commonly known as:  DEPAKOTE ER Take 1 tablet (250 mg total) by mouth daily. What changed:  medication strength  how much to take  when to take this   divalproex 500 MG 24 hr tablet Commonly known as:  DEPAKOTE ER Take 1 tablet (500 mg total) by mouth at bedtime. What changed:  You were already taking a medication with the same name, and this prescription was added. Make sure you understand how and when to take each.   donepezil 10 MG tablet Commonly known  as:  ARICEPT Take 1 tablet (10 mg total) by mouth at bedtime.   folic acid 1 MG tablet Commonly known as:  FOLVITE Take 1 mg by mouth at bedtime.   gabapentin 100 MG capsule Commonly known as:  NEURONTIN Take 200 mg by mouth 2 (two) times daily.   Investigational - Study Medication Inject 140 mg into the skin every 14 (fourteen) days. Repatha cholesterol medicine - injections PCS Canine Inhibitor   lamoTRIgine 150 MG tablet Commonly known as:  LAMICTAL Take 150 mg by mouth 2 (two) times daily.   lisinopril 10 MG tablet Commonly known as:  PRINIVIL,ZESTRIL Take 10 mg by mouth at bedtime.   memantine 5 MG tablet Commonly known as:  NAMENDA Take 5 mg by mouth 2 (two) times daily.  rivaroxaban 20 MG Tabs tablet Commonly known as:  XARELTO Take 20 mg by mouth at bedtime.      Follow-up Information    Horatio Pel, MD Follow up in 1 week(s).   Specialty:  Internal Medicine Contact information: [address] [place] [address] [phone]            Time coordinating discharge: 35 min  Signed:  Marella Vanderpol Alison [name]   [facility] 02/12/2016, 9:06 AM

## 2016-02-12 NOTE — Clinical Social Work Placement (Signed)
   CLINICAL SOCIAL WORK PLACEMENT  NOTE 02/12/16 - DISCHARGED TO [facility] VIA FAMILY   Date:  02/12/2016  Patient Details  Name: [name] [name] MRN: [id number] Date of Birth: 1945-05-02  Clinical Social Work is seeking post-discharge placement for this patient at the [place] level of care (*CSW will initial, date and re-position this form in  chart as items are completed):  Yes   Patient/family provided with [place] Work Department's list of facilities offering this level of care within the geographic area requested by the patient (or if unable, by the patient's family).  Yes   Patient/family informed of their freedom to choose among providers that offer the needed level of care, that participate in Medicare, Medicaid or managed care program needed by the patient, have an available bed and are willing to accept the patient.  Yes   Patient/family informed of [facility]'s ownership interest in [facility] and [facility], as well as of the fact that they are under no obligation to receive care at these facilities.  PASRR submitted to EDS on       PASRR number received on       Existing PASRR number confirmed on 02/12/16     FL2 transmitted to all facilities in geographic area requested by pt/family on 02/12/16     FL2 transmitted to all facilities within larger geographic area on       Patient informed that his/her managed care company has contracts with or will negotiate with certain facilities, including the following:        Yes   Patient/family informed of bed offers received.  Patient chooses bed at [facility]     Physician recommends and patient chooses bed at      Patient to be transferred to [facility] on 02/12/16.  Patient to be transferred to facility by Ambulance [facility])     Patient family notified on 02/12/16 of transfer.  Name of family member notified:  Wife Casimir Grissett and daughter [facility]      PHYSICIAN       Additional Comment:    _______________________________________________ Sable Feil, LCSW 02/12/2016, 6:04 PM

## 2016-02-12 NOTE — Clinical Social Work Note (Signed)
Clinical Social Work Assessment  Patient Details  Name: [name] [name] MRN: [id number] Date of Birth: 09/11/1945  Date of referral:  02/11/16               Reason for consult:  Facility Placement                Permission sought to share information with:  Family Supports Permission granted to share information::  Yes, Verbal Permission Granted  Name::     [name] [name] and [name] [name]::     Relationship::  Wife and daughter  Sport and exercise psychologist Information:  [phone] (Home wife) and [phone] (mobile-daughter)  Housing/Transportation Living arrangements for the past 2 months:  [place] of Information:  Patient, Adult Children, Spouse Patient Interpreter Needed:  None Criminal Activity/Legal Involvement Pertinent to Current Situation/Hospitalization:  No - Comment as needed Significant Relationships:  Adult Children, Spouse Lives with:  Spouse. Son [name] [name] also lives in the home. Do you feel safe going back to the place where you live?  Yes (Patient feels safe at home but is in agreement that ST rehab needed as wife cannot provide needed care at home) Need for family participation in patient care:  Yes (Comment)  Care giving concerns:  Patient/family expressed agreement with ST rehab as wife unable to provide at discharge.     Social Worker assessment / plan:  CSW talked with patient, wife and daughter [name] [name] at the bedside on 12/11 regarding discharge disposition and recommendation of ST rehab. Patient and family in agreement and discussed facility preferences with CSW. Preferences included [facility] and [facility]. CSW informed that patient and his wife have 3 adult children and their son [name] [name] lives with them. Son [name] [name] lives in [place], [place].   Employment status:  Retired Forensic scientist:  Biomedical scientist) PT Recommendations:  [place] / Referral to community resources:  [place] (Family provided with SNF list for [facility])  Patient/Family's Response to care:  No concerns expressed regarding care during hospitalization.  Patient/Family's Understanding of and Emotional Response to Diagnosis, Current Treatment, and Prognosis: Not discussed.  Emotional Assessment Appearance:  Appears stated age Attitude/Demeanor/Rapport:  Other (Appropriate) Affect (typically observed):  Pleasant, Appropriate Orientation:  Oriented to Self, Oriented to Place, Oriented to  Time Alcohol / Substance use:   (Patient reported that he does not smoke, drink or use illicit drugs) Psych involvement (Current and /or in the community):  No (Comment)  Discharge Needs  Concerns to be addressed:  Discharge Planning Concerns Readmission within the last 30 days:  No Current discharge risk:  None Barriers to Discharge:  No Barriers Identified   Sable Feil, LCSW 02/12/2016, 5:51 PM

## 2016-02-13 ENCOUNTER — Encounter: Payer: Self-pay | Admitting: Adult Health

## 2016-02-13 ENCOUNTER — Non-Acute Institutional Stay (SKILLED_NURSING_FACILITY): Payer: Medicare Other | Admitting: Adult Health

## 2016-02-13 DIAGNOSIS — I10 Essential (primary) hypertension: Secondary | ICD-10-CM

## 2016-02-13 DIAGNOSIS — I482 Chronic atrial fibrillation, unspecified: Secondary | ICD-10-CM

## 2016-02-13 DIAGNOSIS — F319 Bipolar disorder, unspecified: Secondary | ICD-10-CM | POA: Diagnosis not present

## 2016-02-13 DIAGNOSIS — R2681 Unsteadiness on feet: Secondary | ICD-10-CM

## 2016-02-13 DIAGNOSIS — R259 Unspecified abnormal involuntary movements: Secondary | ICD-10-CM | POA: Diagnosis not present

## 2016-02-13 DIAGNOSIS — G309 Alzheimer's disease, unspecified: Secondary | ICD-10-CM

## 2016-02-13 DIAGNOSIS — G250 Essential tremor: Secondary | ICD-10-CM

## 2016-02-13 DIAGNOSIS — F0280 Dementia in other diseases classified elsewhere without behavioral disturbance: Secondary | ICD-10-CM

## 2016-02-13 DIAGNOSIS — F0150 Vascular dementia without behavioral disturbance: Secondary | ICD-10-CM | POA: Diagnosis not present

## 2016-02-13 DIAGNOSIS — R29818 Other symptoms and signs involving the nervous system: Secondary | ICD-10-CM

## 2016-02-13 NOTE — Progress Notes (Signed)
DATE:  02/13/2016   MRN:  [id number]  BIRTHDAY: [birth year]  Facility:  Nursing Home Location:  [place] Room Number: [id number]  LEVEL OF CARE:  [id number] [phone])  Contact Information    Name Relation Home Work [place] Spouse [phone]  [phone]   [facility] Daughter [phone]     Deery,Christopher Son   [phone]       Code Status History    Date Active Date Inactive Code Status Order ID Comments User Context   02/11/2016  1:07 AM 02/12/2016  7:49 PM Full Code [id number]  Edwin Dada, MD Inpatient   03/17/2013 11:41 PM 03/21/2013  6:07 PM Full Code [id number]  Orson Eva, MD Inpatient   08/27/2011 10:50 PM 08/28/2011  5:11 PM Full Code [id number]  Blanchie Dessert, MD ED       Chief Complaint  Patient presents with  . Hospitalization Follow-up    HISTORY OF PRESENT ILLNESS:  This is a 70 year old male who has been admitted to [facility] on 02/12/16 following an admission at [facility] 02/10/16-02/12/16. He has PMH of dementia, bipolar on Depakote and lamotrigine, history of stroke, atrial fibrillation on anticoagulant, chronic unexplained episodic diplopia and ataxia, hypertension and restless leg syndrome on Sinemet. He missed 2 doses of Sinemet then he started having movement and gait difficulties. He was noted to be crawling onto the floor and getting stuck. Code stroke was initially called. However, MRI rules out stroke.  He has been admitted for a short-term rehabilitation.  PAST MEDICAL HISTORY:  Past Medical History:  Diagnosis Date  . Alzheimer disease   . Atrial fibrillation ([place])   . Bipolar 1 disorder ([place])   . Dementia   . ED (erectile dysfunction)   . HLD (hyperlipidemia)   . HTN (hypertension)   . Lipoma of skin   . Other and unspecified hyperlipidemia 03/20/2013  . Poor balance   . Stroke ([place]) 1991  . Subclinical hypothyroidism   . Tremor      CURRENT MEDICATIONS:  Reviewed  Patient's Medications  New Prescriptions   No medications on file  Previous Medications   ATORVASTATIN (LIPITOR) 40 MG TABLET    Take 40 mg by mouth at bedtime.    CARBIDOPA-LEVODOPA (SINEMET IR) 25-250 MG PER TABLET    Take 1 tablet by mouth 2 (two) times daily.   DIVALPROEX (DEPAKOTE ER) 250 MG 24 HR TABLET    Take 1 tablet (250 mg total) by mouth daily.   DIVALPROEX (DEPAKOTE ER) 500 MG 24 HR TABLET    Take 1 tablet (500 mg total) by mouth at bedtime.   DONEPEZIL (ARICEPT) 10 MG TABLET    Take 1 tablet (10 mg total) by mouth at bedtime.   FOLIC ACID (FOLVITE) 1 MG TABLET    Take 1 mg by mouth at bedtime.    GABAPENTIN (NEURONTIN) 100 MG CAPSULE    Take 200 mg by mouth 2 (two) times daily.   LAMOTRIGINE (LAMICTAL) 150 MG TABLET    Take 150 mg by mouth 2 (two) times daily.   LISINOPRIL (PRINIVIL,ZESTRIL) 10 MG TABLET    Take 10 mg by mouth at bedtime.    MEMANTINE (NAMENDA) 5 MG TABLET    Take 5 mg by mouth 2 (two) times daily.   RIVAROXABAN (XARELTO) 20 MG TABS TABLET    Take 20 mg by mouth at bedtime.  Modified Medications   No medications on file  Discontinued Medications  STUDY MEDICATION    Inject 140 mg into the skin every 14 (fourteen) days. Repatha cholesterol medicine - injections PCS Canine Inhibitor     Allergies  Allergen Reactions  . Latex Rash    Reaction to knee wraps     REVIEW OF SYSTEMS:  GENERAL: no change in appetite, no fatigue, no weight changes, no fever, chills or weakness SKIN: Denies rash, itching, wounds, ulcer sores, or nail abnormality EYES: Denies change in vision, dry eyes, eye pain, itching or discharge EARS: Denies change in hearing, ringing in ears, or earache NOSE: Denies nasal congestion or epistaxis MOUTH and THROAT: Denies oral discomfort, gingival pain or bleeding, pain from teeth or hoarseness   RESPIRATORY: no cough, SOB, DOE, wheezing, hemoptysis CARDIAC: no chest pain, edema or palpitations GI: no abdominal pain, diarrhea,  constipation, heart burn, nausea or vomiting GU: Denies dysuria, frequency, hematuria, incontinence, or discharge PSYCHIATRIC: Denies feeling of depression or anxiety. No report of hallucinations, insomnia, paranoia, or agitation    PHYSICAL EXAMINATION  GENERAL APPEARANCE: Well nourished. In no acute distress. Normal body habitus SKIN:  Skin is warm and dry.  HEAD: Normal in size and contour. No evidence of trauma EYES: Lids open and close normally. No blepharitis, entropion or ectropion. PERRL. Conjunctivae are clear and sclerae are white. Lenses are without opacity EARS: Pinnae are normal. Patient hears normal voice tunes of the examiner MOUTH and THROAT: Lips are without lesions. Oral mucosa is moist and without lesions. Tongue is normal in shape, size, and color and without lesions NECK: supple, trachea midline, no neck masses, no thyroid tenderness, no thyromegaly LYMPHATICS: no LAN in the neck, no supraclavicular LAN RESPIRATORY: breathing is even & unlabored, BS CTAB CARDIAC: RRR, no murmur,no extra heart sounds, no edema GI: abdomen soft, normal BS, no masses, no tenderness, no hepatomegaly, no splenomegaly EXTREMITIES:  Able to move X 4 extremities PSYCHIATRIC: Alert and oriented X 3. Affect and behavior are appropriate  LABS/RADIOLOGY: Labs reviewed: Basic Metabolic Panel:  Recent Labs  02/10/16 1818 02/10/16 1824 02/12/16 0258  NA 138 140 137  K 4.0 3.9 4.0  CL 104 101 101  CO2 26  --  28  GLUCOSE 99 98 91  BUN 12 13 13   CREATININE 0.97 0.90 0.96  CALCIUM 9.8  --  9.4   Liver Function Tests:  Recent Labs  02/10/16 1818  AST 19  ALT <5*  ALKPHOS 74  BILITOT 0.5  PROT 6.0*  ALBUMIN 3.7    CBC:  Recent Labs  02/10/16 1818 02/10/16 1824 02/12/16 0258  WBC 5.6  --  6.3  NEUTROABS 3.4  --   --   HGB 12.3* 12.2* 12.2*  HCT 37.7* 36.0* 37.9*  MCV 95.0  --  95.5  PLT 143*  --  130*     Mr Brain Wo Contrast  Result Date: 02/10/2016 CLINICAL  DATA:  Ataxia, nystagmus, and diplopia. Symptoms for 3 days. EXAM: MRI HEAD WITHOUT CONTRAST TECHNIQUE: Multiplanar, multiecho pulse sequences of the brain and surrounding structures were obtained without intravenous contrast. COMPARISON:  Head CT 02/10/2016 and MRI 03/17/2013 FINDINGS: Brain: There is no evidence of acute infarct, intracranial hemorrhage, mass, midline shift, or extra-axial fluid collection. There is mild cerebral atrophy. Scattered T2 hyperintensities in the subcortical and deep cerebral white matter are unchanged from the prior MRI and nonspecific but compatible with mild chronic small vessel ischemic disease. Chronic infarct involving the right midbrain is again noted with unchanged volume loss. Vascular: Major intracranial vascular flow  voids are preserved. Skull and upper cervical spine: Unremarkable bone marrow signal. Sinuses/Orbits: Unremarkable orbits. Left maxillary sinus mucous retention cyst. Clear mastoid air cells. Other: None. IMPRESSION: 1. No acute intracranial abnormality. 2. Mild chronic small vessel ischemic disease. Electronically Signed   By: Logan Bores M.D.   On: 02/10/2016 20:48   Ct Head Code Stroke W/o Cm  Result Date: 02/10/2016 CLINICAL DATA:  Code stroke.  Slurred speech. EXAM: CT HEAD WITHOUT CONTRAST TECHNIQUE: Contiguous axial images were obtained from the base of the skull through the vertex without intravenous contrast. COMPARISON:  Brain MRI and CT 03/17/2013 FINDINGS: Brain: There is no evidence of acute cortical infarct, intracranial hemorrhage, mass, midline shift, or extra-axial fluid collection. There is mild cerebral atrophy. Volume loss in the right cerebral peduncle is unchanged and correlates to the previously described remote infarct. Cerebral white matter hypodensities are nonspecific but compatible with mild chronic small vessel ischemic disease. Vascular: No hyperdense vessel or unexpected calcification. Skull: No fracture or focal osseous  lesion. Sinuses/Orbits: Left maxillary sinus mucous retention cyst. Clear mastoid air cells. Unremarkable orbits. Other: None. ASPECTS [facility] Stroke Program Early CT Score) - Ganglionic level infarction (caudate, lentiform nuclei, internal capsule, insula, M1-M3 cortex): 7 - Supraganglionic infarction (M4-M6 cortex): 3 Total score (0-10 with 10 being normal): 10 IMPRESSION: 1. No evidence of acute intracranial abnormality. 2. ASPECTS is 10. 3. Mild chronic small vessel ischemic disease. These results were called by telephone at the time of interpretation on 02/10/2016 at 6:30 pm to Dr. Leonel Ramsay, who verbally acknowledged these results. Electronically Signed   By: Logan Bores M.D.   On: 02/10/2016 18:33    ASSESSMENT/PLAN:  Gait instability - MRI rules out stroke; possibly due to dehydration and missed Sinemet for rehabilitation, PT and OT, for therapeutic and stenting exercises; fall precautions  Atrial fibrillation - rate controlled; continue Xarelto 20 mg 1 tab by mouth daily at bedtime  Hypertension - continue lisinopril 10 mg 1 tab by mouth daily at bedtime  Bipolar disorder - continue Depakote ER 500 mg 1 tab by mouth daily at bedtime, Depakote ER 250 mg 1 tab PO Q AM and lamotrigine 150 mg 1 tab by mouth twice a day  Dementia - continue memantine 5 mg 1 tab by mouth twice a day and donepezil 10 mg 1 tab by mouth daily at bedtime  Essential tremors - continue gabapentin 100 mg 2 capsules = 200 mg by mouth twice a day  Parkinsonian features - continue Sinemet IR 25-250 mg 1 tab by mouth twice a day      Goals of care:  Short-term rehabilitation    Haddie Bruhl C. Weiser - NP [employer] [phone]

## 2016-02-14 ENCOUNTER — Encounter: Payer: Self-pay | Admitting: Internal Medicine

## 2016-02-14 ENCOUNTER — Non-Acute Institutional Stay (SKILLED_NURSING_FACILITY): Payer: Medicare Other | Admitting: Internal Medicine

## 2016-02-14 DIAGNOSIS — I10 Essential (primary) hypertension: Secondary | ICD-10-CM

## 2016-02-14 DIAGNOSIS — M792 Neuralgia and neuritis, unspecified: Secondary | ICD-10-CM

## 2016-02-14 DIAGNOSIS — F0150 Vascular dementia without behavioral disturbance: Secondary | ICD-10-CM

## 2016-02-14 DIAGNOSIS — I482 Chronic atrial fibrillation, unspecified: Secondary | ICD-10-CM

## 2016-02-14 DIAGNOSIS — R2681 Unsteadiness on feet: Secondary | ICD-10-CM

## 2016-02-14 DIAGNOSIS — E785 Hyperlipidemia, unspecified: Secondary | ICD-10-CM

## 2016-02-14 DIAGNOSIS — Z8673 Personal history of transient ischemic attack (TIA), and cerebral infarction without residual deficits: Secondary | ICD-10-CM

## 2016-02-14 DIAGNOSIS — R259 Unspecified abnormal involuntary movements: Secondary | ICD-10-CM

## 2016-02-14 DIAGNOSIS — R531 Weakness: Secondary | ICD-10-CM

## 2016-02-14 DIAGNOSIS — F0280 Dementia in other diseases classified elsewhere without behavioral disturbance: Secondary | ICD-10-CM | POA: Diagnosis not present

## 2016-02-14 DIAGNOSIS — F319 Bipolar disorder, unspecified: Secondary | ICD-10-CM | POA: Diagnosis not present

## 2016-02-14 DIAGNOSIS — G309 Alzheimer's disease, unspecified: Secondary | ICD-10-CM | POA: Diagnosis not present

## 2016-02-14 NOTE — Progress Notes (Signed)
LOCATION: [facility]  PCP: Horatio Pel, MD   Code Status: Full Code  Goals of care: Advanced Directive information Advanced Directives 02/10/2016  Does Patient Have a Medical Advance Directive? No  Would patient like information on creating a medical advance directive? No - Patient declined  Pre-existing out of facility DNR order (yellow form or pink MOST form) -       Extended Emergency Contact Information Primary Emergency Contact: Diggs,[name] Address: [address]          [place],  [address] [place] Phone: [phone] Mobile Phone: [phone] Relation: Spouse Secondary Emergency Contact: Chapdelaine,Traci  [place] Phone: [phone] Relation: Daughter   Allergies  Allergen Reactions  . Latex Rash    Reaction to knee wraps    Chief Complaint  Patient presents with  . New Admit To SNF    New Admisison Visit     HPI:  Patient is a 70 y.o. male seen today for short term rehabilitation post hospital admission from 02/10/16-02/12/16 with generalized weakness. MRI brain was negative for acute intracranial abnormality. He has medical history of parkinson's disease and dementia and had missed his sinemet dosing. Following workup, it was thought that dehydration and medication non compliance had contributed to this. He has medical history of dementia, bipolar disorder, afib, CVA among others. He is seen in his room today.   Review of Systems:  Constitutional: Negative for fever, chills, diaphoresis.  HENT: Negative for headache, congestion, nasal discharge, sore throat, difficulty swallowing.   Eyes: Negative for blurred vision, double vision and discharge. Wears glasses.  Respiratory: Negative for cough, shortness of breath and wheezing.   Cardiovascular: Negative for chest pain, palpitations, leg swelling.  Gastrointestinal: Negative for heartburn, nausea, vomiting, abdominal pain. Last bowel  movement was this morning.   Genitourinary: Negative for dysuria and flank pain.  Musculoskeletal: Negative for back pain, fall in the facility.  Skin: Negative for itching, rash.  Neurological: Negative for dizziness. Psychiatric/Behavioral: Negative for depression   Past Medical History:  Diagnosis Date  . Alzheimer disease   . Atrial fibrillation ([place])   . Bipolar 1 disorder ([place])   . Dementia   . ED (erectile dysfunction)   . HLD (hyperlipidemia)   . HTN (hypertension)   . Lipoma of skin   . Other and unspecified hyperlipidemia 03/20/2013  . Poor balance   . Stroke ([place]) 1991  . Subclinical hypothyroidism   . Tremor    Past Surgical History:  Procedure Laterality Date  . TONSILLECTOMY AND ADENOIDECTOMY     Social History:   reports that he has never smoked. He has never used smokeless tobacco. He reports that he does not drink alcohol or use drugs.  Family History  Problem Relation Age of Onset  . Suicidality    . Cancer    . Alcohol abuse    . Cancer Mother   . Suicidality Father     Medications:   Medication List       Accurate as of 02/14/16 10:15 AM. Always use your most recent med list.          atorvastatin 40 MG tablet Commonly known as:  LIPITOR Take 40 mg by mouth at bedtime.   carbidopa-levodopa 25-250 MG tablet Commonly known as:  SINEMET IR Take 1 tablet by mouth 2 (two) times daily.   divalproex 250 MG 24 hr tablet Commonly known as:  DEPAKOTE ER Take 1 tablet (250 mg total)  by mouth daily.   divalproex 500 MG 24 hr tablet Commonly known as:  DEPAKOTE ER Take 1 tablet (500 mg total) by mouth at bedtime.   donepezil 10 MG tablet Commonly known as:  ARICEPT Take 1 tablet (10 mg total) by mouth at bedtime.   folic acid 1 MG tablet Commonly known as:  FOLVITE Take 1 mg by mouth at bedtime.   gabapentin 100 MG capsule Commonly known as:  NEURONTIN Take 200 mg by mouth 2 (two) times daily.   lamoTRIgine 150 MG tablet Commonly  known as:  LAMICTAL Take 150 mg by mouth 2 (two) times daily.   lisinopril 10 MG tablet Commonly known as:  PRINIVIL,ZESTRIL Take 10 mg by mouth at bedtime.   memantine 5 MG tablet Commonly known as:  NAMENDA Take 5 mg by mouth 2 (two) times daily.   rivaroxaban 20 MG Tabs tablet Commonly known as:  XARELTO Take 20 mg by mouth at bedtime.       Immunizations: There is no immunization history for the selected administration types on file for this patient.   Physical Exam: Vitals:   02/14/16 1013  BP: 140/73  Pulse: 62  Resp: 20  Temp: 97.8 F (36.6 C)  TempSrc: Oral  SpO2: 98%  Weight: 190 lb (86.2 kg)  Height: 5\' 10"  (1.778 m)   Body mass index is 27.26 kg/m.  General- elderly male, well built, in no acute distress Head- normocephalic, atraumatic Nose- no nasal discharge Throat- moist mucus membrane Eyes- PERRLA, EOMI, no pallor, no icterus, no discharge, normal conjunctiva, normal sclera Neck- no cervical lymphadenopathy Cardiovascular- irregular heart rate, no murmur Respiratory- bilateral clear to auscultation, no wheeze, no rhonchi, no crackles, no use of accessory muscles Abdomen- bowel sounds present, soft, non tender Musculoskeletal- able to move all 4 extremities, generalized weakness, on wheelchair Neurological- alert and oriented to person, place and time Skin- warm and dry Psychiatry- normal mood, poor attention span    Labs reviewed: Basic Metabolic Panel:  Recent Labs  02/10/16 1818 02/10/16 1824 02/12/16 0258  NA 138 140 137  K 4.0 3.9 4.0  CL 104 101 101  CO2 26  --  28  GLUCOSE 99 98 91  BUN 12 13 13   CREATININE 0.97 0.90 0.96  CALCIUM 9.8  --  9.4   Liver Function Tests:  Recent Labs  02/10/16 1818  AST 19  ALT <5*  ALKPHOS 74  BILITOT 0.5  PROT 6.0*  ALBUMIN 3.7   No results for input(s): LIPASE, AMYLASE in the last 8760 hours. No results for input(s): AMMONIA in the last 8760 hours. CBC:  Recent Labs   02/10/16 1818 02/10/16 1824 02/12/16 0258  WBC 5.6  --  6.3  NEUTROABS 3.4  --   --   HGB 12.3* 12.2* 12.2*  HCT 37.7* 36.0* 37.9*  MCV 95.0  --  95.5  PLT 143*  --  130*   Cardiac Enzymes: No results for input(s): CKTOTAL, CKMB, CKMBINDEX, TROPONINI in the last 8760 hours. BNP: Invalid input(s): POCBNP CBG: No results for input(s): GLUCAP in the last 8760 hours.  Radiological Exams: Mr Brain Wo Contrast  Result Date: 02/10/2016 CLINICAL DATA:  Ataxia, nystagmus, and diplopia. Symptoms for 3 days. EXAM: MRI HEAD WITHOUT CONTRAST TECHNIQUE: Multiplanar, multiecho pulse sequences of the brain and surrounding structures were obtained without intravenous contrast. COMPARISON:  Head CT 02/10/2016 and MRI 03/17/2013 FINDINGS: Brain: There is no evidence of acute infarct, intracranial hemorrhage, mass, midline shift, or extra-axial fluid  collection. There is mild cerebral atrophy. Scattered T2 hyperintensities in the subcortical and deep cerebral white matter are unchanged from the prior MRI and nonspecific but compatible with mild chronic small vessel ischemic disease. Chronic infarct involving the right midbrain is again noted with unchanged volume loss. Vascular: Major intracranial vascular flow voids are preserved. Skull and upper cervical spine: Unremarkable bone marrow signal. Sinuses/Orbits: Unremarkable orbits. Left maxillary sinus mucous retention cyst. Clear mastoid air cells. Other: None. IMPRESSION: 1. No acute intracranial abnormality. 2. Mild chronic small vessel ischemic disease. Electronically Signed   By: Logan Bores M.D.   On: 02/10/2016 20:48   Ct Head Code Stroke W/o Cm  Result Date: 02/10/2016 CLINICAL DATA:  Code stroke.  Slurred speech. EXAM: CT HEAD WITHOUT CONTRAST TECHNIQUE: Contiguous axial images were obtained from the base of the skull through the vertex without intravenous contrast. COMPARISON:  Brain MRI and CT 03/17/2013 FINDINGS: Brain: There is no evidence of  acute cortical infarct, intracranial hemorrhage, mass, midline shift, or extra-axial fluid collection. There is mild cerebral atrophy. Volume loss in the right cerebral peduncle is unchanged and correlates to the previously described remote infarct. Cerebral white matter hypodensities are nonspecific but compatible with mild chronic small vessel ischemic disease. Vascular: No hyperdense vessel or unexpected calcification. Skull: No fracture or focal osseous lesion. Sinuses/Orbits: Left maxillary sinus mucous retention cyst. Clear mastoid air cells. Unremarkable orbits. Other: None. ASPECTS [facility] Stroke Program Early CT Score) - Ganglionic level infarction (caudate, lentiform nuclei, internal capsule, insula, M1-M3 cortex): 7 - Supraganglionic infarction (M4-M6 cortex): 3 Total score (0-10 with 10 being normal): 10 IMPRESSION: 1. No evidence of acute intracranial abnormality. 2. ASPECTS is 10. 3. Mild chronic small vessel ischemic disease. These results were called by telephone at the time of interpretation on 02/10/2016 at 6:30 pm to Dr. Leonel Ramsay, who verbally acknowledged these results. Electronically Signed   By: Logan Bores M.D.   On: 02/10/2016 18:33    Assessment/Plan  Generalized weakness Will have patient work with PT/OT as tolerated to regain strength and restore function.  Fall precautions are in place.  Unsteady gait With his parkinson's and weakness. Will have him work with physical therapy and occupational therapy team to help with gait training and muscle strengthening exercises.fall precautions. Skin care. Encourage to be out of bed.   Parkinson's features Continue sinemet IR 25-250 mg bid, to work with PT for gait training, monitor  Atrial fibrillation Chronic, Controlled heart rate, continue xarelto for stroke prophylaxis  Dementia without behavioral disturbance Mixed with vascular and alzheimer's. To provide supportive care. Continue home regimen namenda and  aricept.  Bipolar disorder Stable mood. Continue his depakote and lamotrigine, monitor  HTN Monitor bp reading, continue lisinopril, check BMP  History of TIA Continue statin, monitor BP.   HLD Continue atorvastatin  Neuropathic pain Continue gabapentin 200 mg bid and monitor   Goals of care: short term rehabilitation   Labs/tests ordered: cbc, bmp  Family/ staff Communication: reviewed care plan with patient and nursing supervisor    Blanchie Serve, MD Internal Medicine [place], [place] [address] Cell Phone (Monday-Friday 8 am - 5 pm): [phone] On Call: [phone] and follow prompts after 5 pm and on weekends Office Phone: [phone] Office Fax: [phone]

## 2016-02-18 LAB — BASIC METABOLIC PANEL
BUN: 18 mg/dL (ref 4–21)
CREATININE: 1.1 mg/dL (ref 0.6–1.3)
Glucose: 102 mg/dL
POTASSIUM: 4.3 mmol/L (ref 3.4–5.3)
SODIUM: 144 mmol/L (ref 137–147)

## 2016-02-18 LAB — CBC AND DIFFERENTIAL
HCT: 39 % — AB (ref 41–53)
HEMOGLOBIN: 12.8 g/dL — AB (ref 13.5–17.5)
Neutrophils Absolute: 4 /uL
PLATELETS: 163 10*3/uL (ref 150–399)
WBC: 6.4 10^3/mL

## 2016-02-18 LAB — HEPATIC FUNCTION PANEL
ALK PHOS: 83 U/L (ref 25–125)
ALT: 16 U/L (ref 10–40)
AST: 15 U/L (ref 14–40)
Bilirubin, Total: 0.5 mg/dL

## 2016-02-20 ENCOUNTER — Non-Acute Institutional Stay (SKILLED_NURSING_FACILITY): Payer: Medicare Other | Admitting: Adult Health

## 2016-02-20 ENCOUNTER — Encounter: Payer: Self-pay | Admitting: Adult Health

## 2016-02-20 DIAGNOSIS — F0150 Vascular dementia without behavioral disturbance: Secondary | ICD-10-CM

## 2016-02-20 DIAGNOSIS — F319 Bipolar disorder, unspecified: Secondary | ICD-10-CM

## 2016-02-20 DIAGNOSIS — G250 Essential tremor: Secondary | ICD-10-CM

## 2016-02-20 DIAGNOSIS — L719 Rosacea, unspecified: Secondary | ICD-10-CM

## 2016-02-20 DIAGNOSIS — I482 Chronic atrial fibrillation, unspecified: Secondary | ICD-10-CM

## 2016-02-20 DIAGNOSIS — F0280 Dementia in other diseases classified elsewhere without behavioral disturbance: Secondary | ICD-10-CM | POA: Diagnosis not present

## 2016-02-20 DIAGNOSIS — G309 Alzheimer's disease, unspecified: Secondary | ICD-10-CM | POA: Diagnosis not present

## 2016-02-20 DIAGNOSIS — I10 Essential (primary) hypertension: Secondary | ICD-10-CM | POA: Diagnosis not present

## 2016-02-20 DIAGNOSIS — R2681 Unsteadiness on feet: Secondary | ICD-10-CM | POA: Diagnosis not present

## 2016-02-20 NOTE — Progress Notes (Signed)
DATE:  02/20/2016   MRN:  [id number]  BIRTHDAY: 1945-04-06  Facility:  [facility] Location:  [place] [facility] [place] Room Number: [id number]  LEVEL OF CARE:  SNF [phone])  Contact Information    Name Relation Home Work Russell Spouse [phone]  [phone]   [facility] Daughter [phone]     [place]ine,Christopher Son   [phone]       Code Status History    Date Active Date Inactive Code Status Order ID Comments User Context   02/11/2016  1:07 AM 02/12/2016  7:49 PM Full Code [id number]  Edwin Dada, MD Inpatient   03/17/2013 11:41 PM 03/21/2013  6:07 PM Full Code [id number]  Orson Eva, MD Inpatient   08/27/2011 10:50 PM 08/28/2011  5:11 PM Full Code [id number]  Blanchie Dessert, MD ED       Chief Complaint  Patient presents with  . Discharge Note    HISTORY OF PRESENT ILLNESS:  This is a 70 year old male who is for discharge home with Home health PT, OT, SW and Nurse.  He has been admitted to [facility] on 02/12/16 following an admission at [facility] 02/10/16-02/12/16. He has PMH of dementia, bipolar on Depakote and lamotrigine, history of stroke, atrial fibrillation on anticoagulant, chronic unexplained episodic diplopia and ataxia, hypertension and restless leg syndrome on Sinemet. He missed 2 doses of Sinemet then he started having movement and gait difficulties. He was noted to be crawling onto the floor and getting stuck. Code stroke was initially called. However, MRI rules out stroke.  Patient was admitted to this facility for short-term rehabilitation after the patient's recent hospitalization.  Patient has completed SNF rehabilitation and therapy has cleared the patient for discharge.   PAST MEDICAL HISTORY:  Past Medical History:  Diagnosis Date  . Alzheimer disease   . Atrial fibrillation ([facility])    Chronic  . Bipolar 1 disorder ([place])   . Dementia   . ED (erectile dysfunction)   . HLD  (hyperlipidemia)   . HTN (hypertension)   . Lipoma of skin   . Mixed Alzheimer's and vascular dementia   . Other and unspecified hyperlipidemia 03/20/2013  . Parkinsons ([place])   . Poor balance   . Stroke ([place]) 1991  . Subclinical hypothyroidism   . Tremor      CURRENT MEDICATIONS: Reviewed  Patient's Medications  New Prescriptions   No medications on file  Previous Medications   ATORVASTATIN (LIPITOR) 40 MG TABLET    Take 40 mg by mouth at bedtime.    CARBIDOPA-LEVODOPA (SINEMET IR) 25-250 MG PER TABLET    Take 1 tablet by mouth 2 (two) times daily.    DIVALPROEX (DEPAKOTE ER) 250 MG 24 HR TABLET    Take 1 tablet (250 mg total) by mouth daily.   DIVALPROEX (DEPAKOTE ER) 500 MG 24 HR TABLET    Take 1 tablet (500 mg total) by mouth at bedtime.   DONEPEZIL (ARICEPT) 10 MG TABLET    Take 1 tablet (10 mg total) by mouth at bedtime.   FOLIC ACID (FOLVITE) 1 MG TABLET    Take 1 mg by mouth at bedtime.    GABAPENTIN (NEURONTIN) 100 MG CAPSULE    Take 200 mg by mouth 2 (two) times daily.   LAMOTRIGINE (LAMICTAL) 150 MG TABLET    Take 150 mg by mouth 2 (two) times daily.   LISINOPRIL (PRINIVIL,ZESTRIL) 10 MG TABLET    Take 10 mg by mouth at  bedtime.    MEMANTINE (NAMENDA) 5 MG TABLET    Take 5 mg by mouth 2 (two) times daily.   METRONIDAZOLE (METROCREAM) 0.75 % CREAM    Apply 1 application topically 2 (two) times daily. Apply to facial rash   RIVAROXABAN (XARELTO) 20 MG TABS TABLET    Take 20 mg by mouth at bedtime.  Modified Medications   No medications on file  Discontinued Medications   No medications on file     Allergies  Allergen Reactions  . Latex Rash    Reaction to knee wraps     REVIEW OF SYSTEMS:  GENERAL: no change in appetite, no fatigue, no weight changes, no fever, chills or weakness SKIN: Denies rash, itching, wounds, ulcer sores, or nail abnormality EYES: Denies change in vision, dry eyes, eye pain, itching or discharge EARS: Denies change in hearing, ringing in  ears, or earache NOSE: Denies nasal congestion or epistaxis MOUTH and THROAT: Denies oral discomfort, gingival pain or bleeding, pain from teeth or hoarseness   RESPIRATORY: no cough, SOB, DOE, wheezing, hemoptysis CARDIAC: no chest pain, edema or palpitations GI: no abdominal pain, diarrhea, constipation, heart burn, nausea or vomiting GU: Denies dysuria, frequency, hematuria, incontinence, or discharge PSYCHIATRIC: Denies feeling of depression or anxiety. No report of hallucinations, insomnia, paranoia, or agitation    PHYSICAL EXAMINATION  GENERAL APPEARANCE: Well nourished. In no acute distress. Normal body habitus SKIN:  Skin is warm and dry. Rashes on face HEAD: Normal in size and contour. No evidence of trauma EYES: Lids open and close normally. No blepharitis, entropion or ectropion. PERRL. Conjunctivae are clear and sclerae are white. Lenses are without opacity EARS: Pinnae are normal. Patient hears normal voice tunes of the examiner MOUTH and THROAT: Lips are without lesions. Oral mucosa is moist and without lesions. Tongue is normal in shape, size, and color and without lesions NECK: supple, trachea midline, no neck masses, no thyroid tenderness, no thyromegaly LYMPHATICS: no LAN in the neck, no supraclavicular LAN RESPIRATORY: breathing is even & unlabored, BS CTAB CARDIAC: RRR, no murmur,no extra heart sounds, no edema GI: abdomen soft, normal BS, no masses, no tenderness, no hepatomegaly, no splenomegaly EXTREMITIES:  Able to move X 4 extremities PSYCHIATRIC: Alert and oriented X 3. Affect and behavior are appropriate  LABS/RADIOLOGY: Labs reviewed: Basic Metabolic Panel:  Recent Labs  02/10/16 1818 02/10/16 1824 02/12/16 0258 02/18/16  NA 138 140 137 144  K 4.0 3.9 4.0 4.3  CL 104 101 101  --   CO2 26  --  28  --   GLUCOSE 99 98 91  --   BUN 12 13 13 18   CREATININE 0.97 0.90 0.96 1.1  CALCIUM 9.8  --  9.4  --    Liver Function Tests:  Recent Labs   02/10/16 1818 02/18/16  AST 19 15  ALT <5* 16  ALKPHOS 74 83  BILITOT 0.5  --   PROT 6.0*  --   ALBUMIN 3.7  --     CBC:  Recent Labs  02/10/16 1818 02/10/16 1824 02/12/16 0258 02/18/16  WBC 5.6  --  6.3 6.4  NEUTROABS 3.4  --   --  4  HGB 12.3* 12.2* 12.2* 12.8*  HCT 37.7* 36.0* 37.9* 39*  MCV 95.0  --  95.5  --   PLT 143*  --  130* 163     Mr Brain Wo Contrast  Result Date: 02/10/2016 CLINICAL DATA:  Ataxia, nystagmus, and diplopia. Symptoms for 3 days. EXAM:  MRI HEAD WITHOUT CONTRAST TECHNIQUE: Multiplanar, multiecho pulse sequences of the brain and surrounding structures were obtained without intravenous contrast. COMPARISON:  Head CT 02/10/2016 and MRI 03/17/2013 FINDINGS: Brain: There is no evidence of acute infarct, intracranial hemorrhage, mass, midline shift, or extra-axial fluid collection. There is mild cerebral atrophy. Scattered T2 hyperintensities in the subcortical and deep cerebral white matter are unchanged from the prior MRI and nonspecific but compatible with mild chronic small vessel ischemic disease. Chronic infarct involving the right midbrain is again noted with unchanged volume loss. Vascular: Major intracranial vascular flow voids are preserved. Skull and upper cervical spine: Unremarkable bone marrow signal. Sinuses/Orbits: Unremarkable orbits. Left maxillary sinus mucous retention cyst. Clear mastoid air cells. Other: None. IMPRESSION: 1. No acute intracranial abnormality. 2. Mild chronic small vessel ischemic disease. Electronically Signed   By: Logan Bores M.D.   On: 02/10/2016 20:48   Ct Head Code Stroke W/o Cm  Result Date: 02/10/2016 CLINICAL DATA:  Code stroke.  Slurred speech. EXAM: CT HEAD WITHOUT CONTRAST TECHNIQUE: Contiguous axial images were obtained from the base of the skull through the vertex without intravenous contrast. COMPARISON:  Brain MRI and CT 03/17/2013 FINDINGS: Brain: There is no evidence of acute cortical infarct, intracranial  hemorrhage, mass, midline shift, or extra-axial fluid collection. There is mild cerebral atrophy. Volume loss in the right cerebral peduncle is unchanged and correlates to the previously described remote infarct. Cerebral white matter hypodensities are nonspecific but compatible with mild chronic small vessel ischemic disease. Vascular: No hyperdense vessel or unexpected calcification. Skull: No fracture or focal osseous lesion. Sinuses/Orbits: Left maxillary sinus mucous retention cyst. Clear mastoid air cells. Unremarkable orbits. Other: None. ASPECTS [facility] Stroke Program Early CT Score) - Ganglionic level infarction (caudate, lentiform nuclei, internal capsule, insula, M1-M3 cortex): 7 - Supraganglionic infarction (M4-M6 cortex): 3 Total score (0-10 with 10 being normal): 10 IMPRESSION: 1. No evidence of acute intracranial abnormality. 2. ASPECTS is 10. 3. Mild chronic small vessel ischemic disease. These results were called by telephone at the time of interpretation on 02/10/2016 at 6:30 pm to Dr. Leonel Ramsay, who verbally acknowledged these results. Electronically Signed   By: Logan Bores M.D.   On: 02/10/2016 18:33    ASSESSMENT/PLAN:  Gait instability - MRI rules out stroke; possibly due to dehydration and missed Sinemet for Home health SW, Nurse, CNA, PT and OT, for therapeutic and strengthening exercises; fall precautions  Atrial fibrillation - rate controlled; continue Xarelto 20 mg 1 tab by mouth daily at bedtime  Hypertension - continue lisinopril 10 mg 1 tab by mouth daily at bedtime  Bipolar disorder - continue Depakote ER 500 mg 1 tab by mouth daily at bedtime, Depakote ER 250 mg 1 tab PO Q AM and lamotrigine 150 mg 1 tab by mouth twice a day  Dementia - continue memantine 5 mg 1 tab by mouth twice a day and donepezil 10 mg 1 tab by mouth daily at bedtime  Essential tremors - continue gabapentin 100 mg 2 capsules = 200 mg by mouth twice a day  Parkinsonian features - continue  Sinemet IR 25-250 mg 1 tab by mouth twice a day  Rosacea - continue Metronidazole 0.75% cream topically to rashes on face BID      I have filled out patient's discharge paperwork and written prescriptions.  Patient will receive home health SW, Nurse, CNA,  PT and OT.  DME provided:  None  Total discharge time: Greate than 30 minutes Greater than 50% was spent in  counseling and coordination of care with the patient.   Discharge time involved coordination of the discharge process with social worker, nursing staff and therapy department. Medical justification for home health services verified.     Monina C. [place] - NP [employer] [phone]

## 2016-02-22 DIAGNOSIS — G309 Alzheimer's disease, unspecified: Secondary | ICD-10-CM | POA: Diagnosis not present

## 2016-02-22 NOTE — Progress Notes (Signed)
Physical Therapy Addendum for G-codes    02-19-2016 1013  PT G-Codes **NOT FOR INPATIENT CLASS**  Functional Assessment Tool Used clinical judgement  Functional Limitation Mobility: Walking and moving around  Mobility: Walking and Moving Around Current Status [phone]) CJ  Mobility: Walking and Moving Around Goal Status [phone]) CI    02/22/2016 Barry Brunner, PT Pager: [phone]

## 2016-02-27 ENCOUNTER — Encounter (HOSPITAL_COMMUNITY): Payer: Self-pay

## 2016-02-27 DIAGNOSIS — I482 Chronic atrial fibrillation: Secondary | ICD-10-CM | POA: Diagnosis not present

## 2016-02-27 DIAGNOSIS — G629 Polyneuropathy, unspecified: Secondary | ICD-10-CM | POA: Diagnosis not present

## 2016-02-27 DIAGNOSIS — E785 Hyperlipidemia, unspecified: Secondary | ICD-10-CM | POA: Diagnosis not present

## 2016-02-27 DIAGNOSIS — Z7901 Long term (current) use of anticoagulants: Secondary | ICD-10-CM | POA: Diagnosis not present

## 2016-02-27 DIAGNOSIS — Z8673 Personal history of transient ischemic attack (TIA), and cerebral infarction without residual deficits: Secondary | ICD-10-CM | POA: Diagnosis not present

## 2016-02-27 DIAGNOSIS — G309 Alzheimer's disease, unspecified: Secondary | ICD-10-CM | POA: Diagnosis not present

## 2016-02-27 DIAGNOSIS — Z79899 Other long term (current) drug therapy: Secondary | ICD-10-CM | POA: Diagnosis not present

## 2016-02-27 DIAGNOSIS — G20 Parkinson's disease: Secondary | ICD-10-CM | POA: Diagnosis not present

## 2016-02-27 DIAGNOSIS — I10 Essential (primary) hypertension: Secondary | ICD-10-CM | POA: Diagnosis not present

## 2016-02-27 DIAGNOSIS — M25562 Pain in left knee: Secondary | ICD-10-CM | POA: Diagnosis not present

## 2016-02-28 DIAGNOSIS — Z79899 Other long term (current) drug therapy: Secondary | ICD-10-CM | POA: Diagnosis not present

## 2016-02-28 DIAGNOSIS — E785 Hyperlipidemia, unspecified: Secondary | ICD-10-CM | POA: Diagnosis not present

## 2016-02-28 DIAGNOSIS — G629 Polyneuropathy, unspecified: Secondary | ICD-10-CM | POA: Diagnosis not present

## 2016-02-28 DIAGNOSIS — I482 Chronic atrial fibrillation: Secondary | ICD-10-CM | POA: Diagnosis not present

## 2016-02-28 DIAGNOSIS — M25562 Pain in left knee: Secondary | ICD-10-CM | POA: Diagnosis not present

## 2016-02-28 DIAGNOSIS — G20 Parkinson's disease: Secondary | ICD-10-CM | POA: Diagnosis not present

## 2016-02-28 DIAGNOSIS — Z7901 Long term (current) use of anticoagulants: Secondary | ICD-10-CM | POA: Diagnosis not present

## 2016-02-28 DIAGNOSIS — I10 Essential (primary) hypertension: Secondary | ICD-10-CM | POA: Diagnosis not present

## 2016-02-28 DIAGNOSIS — Z8673 Personal history of transient ischemic attack (TIA), and cerebral infarction without residual deficits: Secondary | ICD-10-CM | POA: Diagnosis not present

## 2016-02-28 DIAGNOSIS — G309 Alzheimer's disease, unspecified: Secondary | ICD-10-CM | POA: Diagnosis not present

## 2016-04-11 ENCOUNTER — Encounter: Payer: Self-pay | Admitting: Cardiovascular Disease

## 2016-04-11 ENCOUNTER — Ambulatory Visit (INDEPENDENT_AMBULATORY_CARE_PROVIDER_SITE_OTHER): Payer: Medicare Other | Admitting: Cardiovascular Disease

## 2016-04-11 VITALS — BP 148/91 | HR 80 | Ht 71.0 in | Wt 200.6 lb

## 2016-04-11 DIAGNOSIS — I482 Chronic atrial fibrillation, unspecified: Secondary | ICD-10-CM

## 2016-04-11 DIAGNOSIS — E7800 Pure hypercholesterolemia, unspecified: Secondary | ICD-10-CM

## 2016-04-11 DIAGNOSIS — I10 Essential (primary) hypertension: Secondary | ICD-10-CM | POA: Diagnosis not present

## 2016-04-11 NOTE — Assessment & Plan Note (Signed)
History of hypertension on lisinopril with blood pressure measured 140/91. Continue current medications

## 2016-04-11 NOTE — Progress Notes (Signed)
04/11/2016 [name] [name]   06/29/1945  [id number]  Primary Physician [name] Pel, MD Primary Cardiologist: [name] Harp MD [name] [name]  HPI:  [name])  is a delightful 71 year old married Caucasian male father of 58, grandfather and one grandchild is accompanied by his wife [name] [name] who is also a patient of mine. He has a history of hypertension and hyperlipidemia as well as chronic atrial fibrillation. I initially saw him back in 1991 when he had a stroke and placed him on Coumadin. He was recently transitioned to Xarelto. He has a history of dementia over the last several years as well as bipolar disorder. He has never had a heart attack. He denies chest pain or shortness of breath.   Current Outpatient Prescriptions  Medication Sig Dispense Refill  . atorvastatin (LIPITOR) 40 MG tablet Take 40 mg by mouth at bedtime.     . carbidopa-levodopa (SINEMET IR) 25-250 MG per tablet Take 1 tablet by mouth 2 (two) times daily.     . divalproex (DEPAKOTE ER) 250 MG 24 hr tablet Take 1 tablet (250 mg total) by mouth daily.    . divalproex (DEPAKOTE ER) 500 MG 24 hr tablet Take 1 tablet (500 mg total) by mouth at bedtime.    . donepezil (ARICEPT) 10 MG tablet Take 1 tablet (10 mg total) by mouth at bedtime. 90 tablet 2  . folic acid (FOLVITE) 1 MG tablet Take 1 mg by mouth at bedtime.     . gabapentin (NEURONTIN) 100 MG capsule Take 200 mg by mouth 2 (two) times daily.    Marland Kitchen lamoTRIgine (LAMICTAL) 150 MG tablet Take 150 mg by mouth 2 (two) times daily.    Marland Kitchen lisinopril (PRINIVIL,ZESTRIL) 10 MG tablet Take 10 mg by mouth at bedtime.     . memantine (NAMENDA) 5 MG tablet Take 5 mg by mouth 2 (two) times daily.    . rivaroxaban (XARELTO) 20 MG TABS tablet Take 20 mg by mouth at bedtime.     No current facility-administered medications for this visit.     Allergies  Allergen Reactions  . Latex Rash    Reaction to knee wraps    Social History    Social History  . Marital status: Married    Spouse name: N/A  . Number of children: 3  . Years of education: N/A   Occupational History  . retired    Social History Main Topics  . Smoking status: Never Smoker  . Smokeless tobacco: Never Used  . Alcohol use No     Comment: occasional  . Drug use: No  . Sexual activity: Not on file   Other Topics Concern  . Not on file   Social History Narrative  . No narrative on file     Review of Systems: General: negative for chills, fever, night sweats or weight changes.  Cardiovascular: negative for chest pain, dyspnea on exertion, edema, orthopnea, palpitations, paroxysmal nocturnal dyspnea or shortness of breath Dermatological: negative for rash Respiratory: negative for cough or wheezing Urologic: negative for hematuria Abdominal: negative for nausea, vomiting, diarrhea, bright red blood per rectum, melena, or hematemesis Neurologic: negative for visual changes, syncope, or dizziness All other systems reviewed and are otherwise negative except as noted above.    Blood pressure (!) 148/91, pulse 80, height 5\' 11"  (1.803 m), weight 200 lb 9.6 oz (91 kg), SpO2 98 %.  General appearance: alert and no distress Neck: no adenopathy, no carotid bruit,  no JVD, supple, symmetrical, trachea midline and thyroid not enlarged, symmetric, no tenderness/mass/nodules Lungs: clear to auscultation bilaterally Heart: irregularly irregular rhythm Extremities: extremities normal, atraumatic, no cyanosis or edema  EKG atrial fibrillation with a ventricular response of 80. I personally reviewed this EKG  ASSESSMENT AND PLAN:   Hyperlipidemia History of hyperlipidemia on atorvastatin followed by his PCP  Essential hypertension History of hypertension on lisinopril with blood pressure measured 140/91. Continue current medications  Chronic atrial fibrillation (HCC) History of chronic atrial fibrillation on Coumadin anticoagulation since 1991  when he had a stroke. This did leave him with some weakness. He was recently switched to Xarelto . There is a history of falls over this appears to be related to instability on his feet as opposed to syncope. Given his prior stroke, has had to stop his oral anticoagulant at this time.      [name] Harp MD FACP,FACC,FAHA, [facility] 04/11/2016 4:02 PM

## 2016-04-11 NOTE — Patient Instructions (Signed)

## 2016-04-11 NOTE — Assessment & Plan Note (Signed)
History of chronic atrial fibrillation on Coumadin anticoagulation since 1991 when he had a stroke. This did leave him with some weakness. He was recently switched to Xarelto . There is a history of falls over this appears to be related to instability on his feet as opposed to syncope. Given his prior stroke, has had to stop his oral anticoagulant at this time.

## 2016-04-11 NOTE — Assessment & Plan Note (Signed)
History of hyperlipidemia on atorvastatin followed by his PCP 

## 2016-07-14 ENCOUNTER — Telehealth: Payer: Self-pay | Admitting: Cardiovascular Disease

## 2016-07-14 NOTE — Telephone Encounter (Signed)
Pt wife filled out yellow sheet with attached surgical clearance form for [name] [name].  Requesting surgical clearance:  1. Type of surgery: Right Total Hip Replacement  2. Surgeon: Dr. Delfino Lovett  3.Surgical Date:  pending  4. Medications that need to be held: Xarelto---how long?   5. CAD: No, but Hx of Chronic Afib  6. I will defer to:  Dr. Gwenlyn Found and PharmD   Contact Information:  [facility]  Phone:  [phone]  Fax:  [phone]

## 2016-07-14 NOTE — Telephone Encounter (Signed)
Patient with history of stroke in 51.  Ok to hold Xarelto x 3 days prior to procedure, would need to re-start at full dose (not orthopedic prophylactic dose) day after or at discretion of surgeon.

## 2016-07-14 NOTE — Telephone Encounter (Signed)
Clearance routed to number provided via EPIC. 

## 2016-07-14 NOTE — Telephone Encounter (Signed)
I agree. Otherwise cleared for [facility]

## 2016-07-18 ENCOUNTER — Ambulatory Visit: Payer: Self-pay | Admitting: Orthopedic Surgery

## 2016-07-18 NOTE — Progress Notes (Signed)
Please place orders in EPIC as patient is being scheduled for a pre-op appointment! Thank you! 

## 2016-07-22 ENCOUNTER — Ambulatory Visit: Payer: Self-pay | Admitting: Orthopedic Surgery

## 2016-07-22 NOTE — H&P (Signed)
TOTAL HIP ADMISSION H&P  Patient is admitted for right total hip arthroplasty.  Subjective:  Chief Complaint: right hip pain  HPI: [name] [name], 71 y.o. male, has a history of pain and functional disability in the right hip(s) due to arthritis and patient has failed non-surgical conservative treatments for greater than 12 weeks to include NSAID's and/or analgesics, flexibility and strengthening excercises, use of assistive devices, weight reduction as appropriate and activity modification.  Onset of symptoms was gradual starting >10 years ago with gradually worsening course since that time.The patient noted no past surgery on the right hip(s).  Patient currently rates pain in the right hip at 10 out of 10 with activity. Patient has night pain, worsening of pain with activity and weight bearing, pain that interfers with activities of daily living and pain with passive range of motion. Patient has evidence of subchondral cysts, subchondral sclerosis, periarticular osteophytes and joint space narrowing by imaging studies. This condition presents safety issues increasing the risk of falls. There is no current active infection.  Patient Active Problem List   Diagnosis Date Noted  . Weakness 02/10/2016  . Mixed Alzheimer's and vascular dementia 10/28/2013  . Parkinsonian features 03/31/2013  . History of stroke 03/23/2013  . Bipolar disorder ([place]) 03/20/2013  . Hyperlipidemia 03/20/2013  . Brain TIA 03/18/2013  . Gait instability 03/17/2013  . Chronic atrial fibrillation ([place]) 03/17/2013  . Essential hypertension 03/17/2013   Past Medical History:  Diagnosis Date  . Alzheimer disease   . Atrial fibrillation (HCC)    Chronic  . Bipolar 1 disorder ([place])   . Dementia   . ED (erectile dysfunction)   . HLD (hyperlipidemia)   . HTN (hypertension)   . Lipoma of skin   . Mixed Alzheimer's and vascular dementia   . Other and unspecified hyperlipidemia 03/20/2013  . Parkinsons ([place])   . Poor  balance   . Stroke ([place]) 1991  . Subclinical hypothyroidism   . Tremor     Past Surgical History:  Procedure Laterality Date  . TONSILLECTOMY AND ADENOIDECTOMY       (Not in a hospital admission) Allergies  Allergen Reactions  . Latex Rash    Reaction to knee wraps    Social History  Substance Use Topics  . Smoking status: Never Smoker  . Smokeless tobacco: Never Used  . Alcohol use No     Comment: occasional    Family History  Problem Relation Age of Onset  . Suicidality Unknown   . Cancer Unknown   . Alcohol abuse Unknown   . Cancer Mother   . Suicidality Father      Review of Systems  Constitutional: Positive for malaise/fatigue.  HENT: Negative.   Eyes: Positive for double vision.  Respiratory: Negative.   Cardiovascular: Negative.   Gastrointestinal: Negative.   Genitourinary: Negative.   Musculoskeletal: Positive for joint pain.  Skin: Positive for itching and rash.  Neurological: Positive for tremors.  Psychiatric/Behavioral: Negative.     Objective:  Physical Exam  Vitals reviewed. Constitutional: He is oriented to person, place, and time. He appears well-developed and well-nourished.  HENT:  Head: Normocephalic and atraumatic.  Eyes: Conjunctivae and EOM are normal. Pupils are equal, round, and reactive to light.  Neck: Normal range of motion. Neck supple.  Cardiovascular: Normal rate, regular rhythm and intact distal pulses.   Respiratory: Effort normal. No respiratory distress.  GI: Soft. He exhibits no distension.  Genitourinary:  Genitourinary Comments: deferred  Musculoskeletal:  Right hip: He exhibits decreased range of motion and bony tenderness.  Neurological: He is alert and oriented to person, place, and time. He has normal reflexes.  Skin: Skin is warm and dry.  Psychiatric: He has a normal mood and affect. His behavior is normal. Judgment and thought content normal.    Vital signs in last 24  hours: @VSRANGES @  Labs:   Estimated body mass index is 27.98 kg/m as calculated from the following:   Height as of 04/11/16: 5\' 11"  (1.803 m).   Weight as of 04/11/16: 91 kg (200 lb 9.6 oz).   Imaging Review Plain radiographs demonstrate severe degenerative joint disease of the right hip(s). The bone quality appears to be adequate for age and reported activity level.  Assessment/Plan:  End stage arthritis, right hip(s)  The patient history, physical examination, clinical judgement of the provider and imaging studies are consistent with end stage degenerative joint disease of the right hip(s) and total hip arthroplasty is deemed medically necessary. The treatment options including medical management, injection therapy, arthroscopy and arthroplasty were discussed at length. The risks and benefits of total hip arthroplasty were presented and reviewed. The risks due to aseptic loosening, infection, stiffness, dislocation/subluxation,  thromboembolic complications and other imponderables were discussed.  The patient acknowledged the explanation, agreed to proceed with the plan and consent was signed. Patient is being admitted for inpatient treatment for surgery, pain control, PT, OT, prophylactic antibiotics, VTE prophylaxis, progressive ambulation and ADL's and discharge planning.The patient is planning to be discharged home with HEP.

## 2016-07-23 ENCOUNTER — Encounter (HOSPITAL_COMMUNITY): Payer: Self-pay

## 2016-07-23 ENCOUNTER — Encounter (HOSPITAL_COMMUNITY)
Admission: RE | Admit: 2016-07-23 | Discharge: 2016-07-23 | Disposition: A | Discharge status: Home / Self Care | Payer: Medicare Other | Source: Ambulatory Visit | Attending: Orthopedic Surgery | Admitting: Orthopedic Surgery

## 2016-07-23 ENCOUNTER — Encounter (INDEPENDENT_AMBULATORY_CARE_PROVIDER_SITE_OTHER): Payer: Self-pay

## 2016-07-23 DIAGNOSIS — M25551 Pain in right hip: Secondary | ICD-10-CM | POA: Insufficient documentation

## 2016-07-23 DIAGNOSIS — Z01812 Encounter for preprocedural laboratory examination: Secondary | ICD-10-CM | POA: Insufficient documentation

## 2016-07-23 HISTORY — DX: Depression, unspecified: F32.A

## 2016-07-23 HISTORY — DX: Cardiac arrhythmia, unspecified: I49.9

## 2016-07-23 HISTORY — DX: Major depressive disorder, single episode, unspecified: F32.9

## 2016-07-23 HISTORY — DX: Anxiety disorder, unspecified: F41.9

## 2016-07-23 HISTORY — DX: Sleep apnea, unspecified: G47.30

## 2016-07-23 HISTORY — DX: Unspecified osteoarthritis, unspecified site: M19.90

## 2016-07-23 LAB — BASIC METABOLIC PANEL
ANION GAP: 7 (ref 5–15)
BUN: 19 mg/dL (ref 6–20)
CALCIUM: 9.4 mg/dL (ref 8.9–10.3)
CO2: 24 mmol/L (ref 22–32)
CREATININE: 0.93 mg/dL (ref 0.61–1.24)
Chloride: 106 mmol/L (ref 101–111)
GLUCOSE: 132 mg/dL — AB (ref 65–99)
Potassium: 4.2 mmol/L (ref 3.5–5.1)
Sodium: 137 mmol/L (ref 135–145)

## 2016-07-23 LAB — CBC
HCT: 38.3 % — ABNORMAL LOW (ref 39.0–52.0)
Hemoglobin: 12.4 g/dL — ABNORMAL LOW (ref 13.0–17.0)
MCH: 30.8 pg (ref 26.0–34.0)
MCHC: 32.4 g/dL (ref 30.0–36.0)
MCV: 95.3 fL (ref 78.0–100.0)
Platelets: 157 10*3/uL (ref 150–400)
RBC: 4.02 MIL/uL — ABNORMAL LOW (ref 4.22–5.81)
RDW: 13 % (ref 11.5–15.5)
WBC: 6 10*3/uL (ref 4.0–10.5)

## 2016-07-23 LAB — SURGICAL PCR SCREEN
MRSA, PCR: NEGATIVE
Staphylococcus aureus: NEGATIVE

## 2016-07-23 LAB — ABO/RH: ABO/RH(D): O POS

## 2016-07-23 NOTE — Patient Instructions (Signed)
[name] [name]  07/23/2016   Your procedure is scheduled on:  07/31/16  Report to [facility] Main  Entrance Take [place]  elevators to 3rd floor to  [place] at     [phone].    Call this number if you have problems the morning of surgery [phone]   Remember: ONLY 1 PERSON MAY GO WITH YOU TO SHORT STAY TO GET  READY MORNING OF YOUR SURGERY.  Do not eat food or drink liquids :After Midnight.     Take these medicines the morning of surgery with A SIP OF WATER:  Gabapentin, carbidopa- levodopa , lamictal                                You may not have any metal on your body including hair pins and              piercings  Do not wear jewelry, , lotions, powders or perfumes, deodorant            .              Men may shave face and neck.   Do not bring valuables to the hospital. [place].  Contacts, dentures or bridgework may not be worn into surgery.  Leave suitcase in the car. After surgery it may be brought to your room.                 Please read over the following fact sheets you were given: _____________________________________________________________________             [facility] - Preparing for Surgery Before surgery, you can play an important role.  Because skin is not sterile, your skin needs to be as free of germs as possible.  You can reduce the number of germs on your skin by washing with CHG (chlorahexidine gluconate) soap before surgery.  CHG is an antiseptic cleaner which kills germs and bonds with the skin to continue killing germs even after washing. Please DO NOT use if you have an allergy to CHG or antibacterial soaps.  If your skin becomes reddened/irritated stop using the CHG and inform your nurse when you arrive at Short Stay. Do not shave (including legs and underarms) for at least 48 hours prior to the first CHG shower.  You may shave your face/neck. Please follow these  instructions carefully:  1.  Shower with CHG Soap the night before surgery and the  morning of Surgery.  2.  If you choose to wash your hair, wash your hair first as usual with your  normal  shampoo.  3.  After you shampoo, rinse your hair and body thoroughly to remove the  shampoo.                           4.  Use CHG as you would any other liquid soap.  You can apply chg directly  to the skin and wash                       Gently with a scrungie or clean washcloth.  5.  Apply the CHG Soap to your body ONLY FROM  THE NECK DOWN.   Do not use on face/ open                           Wound or open sores. Avoid contact with eyes, ears mouth and genitals (private parts).                       Wash face,  Genitals (private parts) with your normal soap.             6.  Wash thoroughly, paying special attention to the area where your surgery  will be performed.  7.  Thoroughly rinse your body with warm water from the neck down.  8.  DO NOT shower/wash with your normal soap after using and rinsing off  the CHG Soap.                9.  Pat yourself dry with a clean towel.            10.  Wear clean pajamas.            11.  Place clean sheets on your bed the night of your first shower and do not  sleep with pets. Day of Surgery : Do not apply any lotions/deodorants the morning of surgery.  Please wear clean clothes to the hospital/surgery center.  FAILURE TO FOLLOW THESE INSTRUCTIONS MAY RESULT IN THE CANCELLATION OF YOUR SURGERY PATIENT SIGNATURE_________________________________  NURSE SIGNATURE__________________________________  ________________________________________________________________________  WHAT IS A BLOOD TRANSFUSION? Blood Transfusion Information  A transfusion is the replacement of blood or some of its parts. Blood is made up of multiple cells which provide different functions.  Red blood cells carry oxygen and are used for blood loss replacement.  White blood cells fight against  infection.  Platelets control bleeding.  Plasma helps clot blood.  Other blood products are available for specialized needs, such as hemophilia or other clotting disorders. BEFORE THE TRANSFUSION  Who gives blood for transfusions?   Healthy volunteers who are fully evaluated to make sure their blood is safe. This is blood bank blood. Transfusion therapy is the safest it has ever been in the practice of medicine. Before blood is taken from a donor, a complete history is taken to make sure that person has no history of diseases nor engages in risky social behavior (examples are intravenous drug use or sexual activity with multiple partners). The donor's travel history is screened to minimize risk of transmitting infections, such as malaria. The donated blood is tested for signs of infectious diseases, such as HIV and hepatitis. The blood is then tested to be sure it is compatible with you in order to minimize the chance of a transfusion reaction. If you or a relative donates blood, this is often done in anticipation of surgery and is not appropriate for emergency situations. It takes many days to process the donated blood. RISKS AND COMPLICATIONS Although transfusion therapy is very safe and saves many lives, the main dangers of transfusion include:   Getting an infectious disease.  Developing a transfusion reaction. This is an allergic reaction to something in the blood you were given. Every precaution is taken to prevent this. The decision to have a blood transfusion has been considered carefully by your caregiver before blood is given. Blood is not given unless the benefits outweigh the risks. AFTER THE TRANSFUSION  Right after receiving a blood transfusion, you will usually feel much better and  more energetic. This is especially true if your red blood cells have gotten low (anemic). The transfusion raises the level of the red blood cells which carry oxygen, and this usually causes an energy  increase.  The nurse administering the transfusion will monitor you carefully for complications. HOME CARE INSTRUCTIONS  No special instructions are needed after a transfusion. You may find your energy is better. Speak with your caregiver about any limitations on activity for underlying diseases you may have. SEEK MEDICAL CARE IF:   Your condition is not improving after your transfusion.  You develop redness or irritation at the intravenous (IV) site. SEEK IMMEDIATE MEDICAL CARE IF:  Any of the following symptoms occur over the next 12 hours:  Shaking chills.  You have a temperature by mouth above 102 F (38.9 C), not controlled by medicine.  Chest, back, or muscle pain.  People around you feel you are not acting correctly or are confused.  Shortness of breath or difficulty breathing.  Dizziness and fainting.  You get a rash or develop hives.  You have a decrease in urine output.  Your urine turns a dark color or changes to pink, red, or brown. Any of the following symptoms occur over the next 10 days:  You have a temperature by mouth above 102 F (38.9 C), not controlled by medicine.  Shortness of breath.  Weakness after normal activity.  The white part of the eye turns yellow (jaundice).  You have a decrease in the amount of urine or are urinating less often.  Your urine turns a dark color or changes to pink, red, or brown. Document Released: 02/15/2000 Document Revised: 05/12/2011 Document Reviewed: 10/04/2007 ExitCare Patient Information 2014 [place].  _______________________________________________________________________  Incentive Spirometer  An incentive spirometer is a tool that can help keep your lungs clear and active. This tool measures how well you are filling your lungs with each breath. Taking long deep breaths may help reverse or decrease the chance of developing breathing (pulmonary) problems (especially infection) following:  A long  period of time when you are unable to move or be active. BEFORE THE PROCEDURE   If the spirometer includes an indicator to show your best effort, your nurse or respiratory therapist will set it to a desired goal.  If possible, sit up straight or lean slightly forward. Try not to slouch.  Hold the incentive spirometer in an upright position. INSTRUCTIONS FOR USE  1. Sit on the edge of your bed if possible, or sit up as far as you can in bed or on a chair. 2. Hold the incentive spirometer in an upright position. 3. Breathe out normally. 4. Place the mouthpiece in your mouth and seal your lips tightly around it. 5. Breathe in slowly and as deeply as possible, raising the piston or the ball toward the top of the column. 6. Hold your breath for 3-5 seconds or for as long as possible. Allow the piston or ball to fall to the bottom of the column. 7. Remove the mouthpiece from your mouth and breathe out normally. 8. Rest for a few seconds and repeat Steps 1 through 7 at least 10 times every 1-2 hours when you are awake. Take your time and take a few normal breaths between deep breaths. 9. The spirometer may include an indicator to show your best effort. Use the indicator as a goal to work toward during each repetition. 10. After each set of 10 deep breaths, practice coughing to be sure your lungs  are clear. If you have an incision (the cut made at the time of surgery), support your incision when coughing by placing a pillow or rolled up towels firmly against it. Once you are able to get out of bed, walk around indoors and cough well. You may stop using the incentive spirometer when instructed by your caregiver.  RISKS AND COMPLICATIONS  Take your time so you do not get dizzy or light-headed.  If you are in pain, you may need to take or ask for pain medication before doing incentive spirometry. It is harder to take a deep breath if you are having pain. AFTER USE  Rest and breathe slowly and  easily.  It can be helpful to keep track of a log of your progress. Your caregiver can provide you with a simple table to help with this. If you are using the spirometer at home, follow these instructions: [place] IF:   You are having difficultly using the spirometer.  You have trouble using the spirometer as often as instructed.  Your pain medication is not giving enough relief while using the spirometer.  You develop fever of 100.5 F (38.1 C) or higher. SEEK IMMEDIATE MEDICAL CARE IF:   You cough up bloody sputum that had not been present before.  You develop fever of 102 F (38.9 C) or greater.  You develop worsening pain at or near the incision site. MAKE SURE YOU:   Understand these instructions.  Will watch your condition.  Will get help right away if you are not doing well or get worse. Document Released: 06/30/2006 Document Revised: 05/12/2011 Document Reviewed: 08/31/2006 [facility] Patient Information 2014 [place], [place].   ________________________________________________________________________

## 2016-07-23 NOTE — Progress Notes (Signed)
07/09/16 clearance pcp and Dr. Gwenlyn Found and lov note 07/09/16 on chart   ekg 04/11/16 in epic

## 2016-07-31 ENCOUNTER — Inpatient Hospital Stay (HOSPITAL_COMMUNITY)
Admission: RE | Admit: 2016-07-31 | Discharge: 2016-08-01 | DRG: 470 | Disposition: A | Discharge status: Home Health | Payer: Medicare Other | Source: Ambulatory Visit | Attending: Orthopedic Surgery | Admitting: Orthopedic Surgery

## 2016-07-31 ENCOUNTER — Inpatient Hospital Stay (HOSPITAL_COMMUNITY): Payer: Medicare Other

## 2016-07-31 ENCOUNTER — Inpatient Hospital Stay (HOSPITAL_COMMUNITY): Payer: Medicare Other | Admitting: Certified Registered Nurse Anesthetist

## 2016-07-31 ENCOUNTER — Encounter (HOSPITAL_COMMUNITY)
Admission: RE | Disposition: A | Discharge status: Home Health | Payer: Self-pay | Source: Ambulatory Visit | Attending: Orthopedic Surgery

## 2016-07-31 ENCOUNTER — Encounter (HOSPITAL_COMMUNITY): Payer: Self-pay | Admitting: *Deleted

## 2016-07-31 DIAGNOSIS — G309 Alzheimer's disease, unspecified: Secondary | ICD-10-CM | POA: Diagnosis present

## 2016-07-31 DIAGNOSIS — I10 Essential (primary) hypertension: Secondary | ICD-10-CM | POA: Diagnosis present

## 2016-07-31 DIAGNOSIS — G20 Parkinson's disease: Secondary | ICD-10-CM | POA: Diagnosis present

## 2016-07-31 DIAGNOSIS — E785 Hyperlipidemia, unspecified: Secondary | ICD-10-CM | POA: Diagnosis present

## 2016-07-31 DIAGNOSIS — I482 Chronic atrial fibrillation: Secondary | ICD-10-CM | POA: Diagnosis present

## 2016-07-31 DIAGNOSIS — F0280 Dementia in other diseases classified elsewhere without behavioral disturbance: Secondary | ICD-10-CM | POA: Diagnosis present

## 2016-07-31 DIAGNOSIS — E039 Hypothyroidism, unspecified: Secondary | ICD-10-CM | POA: Diagnosis present

## 2016-07-31 DIAGNOSIS — G4730 Sleep apnea, unspecified: Secondary | ICD-10-CM | POA: Diagnosis present

## 2016-07-31 DIAGNOSIS — R52 Pain, unspecified: Secondary | ICD-10-CM

## 2016-07-31 DIAGNOSIS — F319 Bipolar disorder, unspecified: Secondary | ICD-10-CM | POA: Diagnosis present

## 2016-07-31 DIAGNOSIS — Z8673 Personal history of transient ischemic attack (TIA), and cerebral infarction without residual deficits: Secondary | ICD-10-CM | POA: Diagnosis not present

## 2016-07-31 DIAGNOSIS — M1611 Unilateral primary osteoarthritis, right hip: Secondary | ICD-10-CM | POA: Diagnosis present

## 2016-07-31 DIAGNOSIS — Z91040 Latex allergy status: Secondary | ICD-10-CM | POA: Diagnosis not present

## 2016-07-31 DIAGNOSIS — F419 Anxiety disorder, unspecified: Secondary | ICD-10-CM | POA: Diagnosis present

## 2016-07-31 DIAGNOSIS — F0150 Vascular dementia without behavioral disturbance: Secondary | ICD-10-CM | POA: Diagnosis present

## 2016-07-31 DIAGNOSIS — Z09 Encounter for follow-up examination after completed treatment for conditions other than malignant neoplasm: Secondary | ICD-10-CM

## 2016-07-31 HISTORY — PX: TOTAL HIP ARTHROPLASTY: SHX124

## 2016-07-31 LAB — TYPE AND SCREEN
ABO/RH(D): O POS
ANTIBODY SCREEN: NEGATIVE

## 2016-07-31 SURGERY — ARTHROPLASTY, HIP, TOTAL, ANTERIOR APPROACH
Anesthesia: Spinal | Site: Hip | Laterality: Right

## 2016-07-31 MED ORDER — SODIUM CHLORIDE 0.9 % IR SOLN
Status: DC | PRN
  Administered 2016-07-31: 1000 mL

## 2016-07-31 MED ORDER — SODIUM CHLORIDE 0.9 % IV SOLN
INTRAVENOUS | Status: DC
  Administered 2016-07-31 – 2016-08-01 (×2): via INTRAVENOUS

## 2016-07-31 MED ORDER — ONDANSETRON HCL 4 MG/2ML IJ SOLN: 4.0000 mg | Freq: Four times a day (QID) | INTRAMUSCULAR | Status: DC | PRN

## 2016-07-31 MED ORDER — LACTATED RINGERS IV SOLN
INTRAVENOUS | Status: DC
  Administered 2016-07-31 (×2): via INTRAVENOUS

## 2016-07-31 MED ORDER — DEXAMETHASONE SODIUM PHOSPHATE 10 MG/ML IJ SOLN
INTRAMUSCULAR | Status: DC | PRN
  Administered 2016-07-31: 10 mg via INTRAVENOUS

## 2016-07-31 MED ORDER — MEMANTINE HCL 10 MG PO TABS
10.0000 mg | ORAL_TABLET | Freq: Every day | ORAL | Status: DC
  Administered 2016-07-31: 10 mg via ORAL
  Filled 2016-07-31: qty 1

## 2016-07-31 MED ORDER — ACETAMINOPHEN 10 MG/ML IV SOLN
1000.0000 mg | INTRAVENOUS | Status: AC
  Administered 2016-07-31: 1000 mg via INTRAVENOUS
  Filled 2016-07-31: qty 100

## 2016-07-31 MED ORDER — HYDROMORPHONE HCL 1 MG/ML IJ SOLN: 0.5000 mg | INTRAMUSCULAR | Status: DC | PRN

## 2016-07-31 MED ORDER — MENTHOL 3 MG MT LOZG: 1.0000 | LOZENGE | OROMUCOSAL | Status: DC | PRN

## 2016-07-31 MED ORDER — ALUM & MAG HYDROXIDE-SIMETH 200-200-20 MG/5ML PO SUSP: 30.0000 mL | ORAL | Status: DC | PRN

## 2016-07-31 MED ORDER — GABAPENTIN 100 MG PO CAPS
200.0000 mg | ORAL_CAPSULE | Freq: Two times a day (BID) | ORAL | Status: DC
  Administered 2016-07-31 – 2016-08-01 (×2): 200 mg via ORAL
  Filled 2016-07-31 (×2): qty 2

## 2016-07-31 MED ORDER — ONDANSETRON HCL 4 MG/2ML IJ SOLN
INTRAMUSCULAR | Status: AC
  Filled 2016-07-31: qty 2

## 2016-07-31 MED ORDER — CARBIDOPA-LEVODOPA 25-250 MG PO TABS
1.0000 | ORAL_TABLET | Freq: Two times a day (BID) | ORAL | Status: DC
  Administered 2016-07-31 – 2016-08-01 (×2): 1 via ORAL
  Filled 2016-07-31 (×2): qty 1

## 2016-07-31 MED ORDER — DIVALPROEX SODIUM ER 500 MG PO TB24
500.0000 mg | ORAL_TABLET | Freq: Every day | ORAL | Status: DC
  Administered 2016-07-31: 500 mg via ORAL
  Filled 2016-07-31: qty 1

## 2016-07-31 MED ORDER — CEFAZOLIN SODIUM-DEXTROSE 2-4 GM/100ML-% IV SOLN
2.0000 g | INTRAVENOUS | Status: AC
  Administered 2016-07-31: 2 g via INTRAVENOUS
  Filled 2016-07-31: qty 100

## 2016-07-31 MED ORDER — ISOPROPYL ALCOHOL 70 % SOLN
Status: AC
  Filled 2016-07-31: qty 480

## 2016-07-31 MED ORDER — METOCLOPRAMIDE HCL 5 MG PO TABS: 5.0000 mg | ORAL_TABLET | Freq: Three times a day (TID) | ORAL | Status: DC | PRN

## 2016-07-31 MED ORDER — CHLORHEXIDINE GLUCONATE 4 % EX LIQD: 60.0000 mL | Freq: Once | CUTANEOUS | Status: DC

## 2016-07-31 MED ORDER — KETOROLAC TROMETHAMINE 30 MG/ML IJ SOLN
INTRAMUSCULAR | Status: DC | PRN
Start: 2016-07-31 — End: 2016-07-31
  Administered 2016-07-31: 30 mg

## 2016-07-31 MED ORDER — SENNA 8.6 MG PO TABS: 2.0000 | ORAL_TABLET | Freq: Every day | ORAL | Status: DC

## 2016-07-31 MED ORDER — SODIUM CHLORIDE 0.9 % IJ SOLN
INTRAMUSCULAR | Status: DC | PRN
  Administered 2016-07-31: 29 mL

## 2016-07-31 MED ORDER — HYDROMORPHONE HCL 1 MG/ML IJ SOLN: 0.2500 mg | INTRAMUSCULAR | Status: DC | PRN

## 2016-07-31 MED ORDER — DEXAMETHASONE SODIUM PHOSPHATE 10 MG/ML IJ SOLN
10.0000 mg | Freq: Once | INTRAMUSCULAR | Status: AC
  Administered 2016-08-01: 10 mg via INTRAVENOUS
  Filled 2016-07-31: qty 1

## 2016-07-31 MED ORDER — WATER FOR IRRIGATION, STERILE IR SOLN
Status: DC | PRN
Start: 2016-07-31 — End: 2016-07-31
  Administered 2016-07-31: 2000 mL via SURGICAL_CAVITY

## 2016-07-31 MED ORDER — CEFAZOLIN SODIUM-DEXTROSE 2-4 GM/100ML-% IV SOLN
2.0000 g | Freq: Four times a day (QID) | INTRAVENOUS | Status: AC
  Administered 2016-07-31 – 2016-08-01 (×2): 2 g via INTRAVENOUS
  Filled 2016-07-31 (×2): qty 100

## 2016-07-31 MED ORDER — DONEPEZIL HCL 10 MG PO TABS
10.0000 mg | ORAL_TABLET | Freq: Every day | ORAL | Status: DC
  Administered 2016-07-31: 23:00:00 10 mg via ORAL
  Filled 2016-07-31: qty 1

## 2016-07-31 MED ORDER — POVIDONE-IODINE 10 % EX SWAB: 2.0000 "application " | Freq: Once | CUTANEOUS | Status: DC

## 2016-07-31 MED ORDER — ONDANSETRON HCL 4 MG PO TABS: 4.0000 mg | ORAL_TABLET | Freq: Four times a day (QID) | ORAL | Status: DC | PRN

## 2016-07-31 MED ORDER — KETOROLAC TROMETHAMINE 30 MG/ML IJ SOLN
INTRAMUSCULAR | Status: AC
  Filled 2016-07-31: qty 1

## 2016-07-31 MED ORDER — LIDOCAINE HCL 1 % IJ SOLN
INTRAMUSCULAR | Status: AC
  Filled 2016-07-31: qty 20

## 2016-07-31 MED ORDER — ONDANSETRON HCL 4 MG/2ML IJ SOLN
INTRAMUSCULAR | Status: DC | PRN
  Administered 2016-07-31: 4 mg via INTRAVENOUS

## 2016-07-31 MED ORDER — METHOCARBAMOL 1000 MG/10ML IJ SOLN
500.0000 mg | Freq: Four times a day (QID) | INTRAVENOUS | Status: DC | PRN
  Administered 2016-08-01: 01:00:00 500 mg via INTRAVENOUS
  Filled 2016-07-31: qty 550

## 2016-07-31 MED ORDER — EPHEDRINE 5 MG/ML INJ
INTRAVENOUS | Status: AC
  Filled 2016-07-31: qty 10

## 2016-07-31 MED ORDER — SODIUM CHLORIDE 0.9 % IJ SOLN
INTRAMUSCULAR | Status: AC
  Filled 2016-07-31: qty 50

## 2016-07-31 MED ORDER — PROMETHAZINE HCL 25 MG/ML IJ SOLN: 6.2500 mg | INTRAMUSCULAR | Status: DC | PRN

## 2016-07-31 MED ORDER — LAMOTRIGINE 25 MG PO TABS
150.0000 mg | ORAL_TABLET | Freq: Two times a day (BID) | ORAL | Status: DC
  Administered 2016-07-31 – 2016-08-01 (×2): 150 mg via ORAL
  Filled 2016-07-31 (×2): qty 1

## 2016-07-31 MED ORDER — SODIUM CHLORIDE 0.9 % IV SOLN: INTRAVENOUS | Status: DC

## 2016-07-31 MED ORDER — SODIUM CHLORIDE 0.9 % IR SOLN
Status: DC | PRN
  Administered 2016-07-31: 4000 mL

## 2016-07-31 MED ORDER — PROPOFOL 10 MG/ML IV BOLUS
INTRAVENOUS | Status: AC
Start: 2016-07-31 — End: 2016-07-31
  Filled 2016-07-31: qty 60

## 2016-07-31 MED ORDER — DOCUSATE SODIUM 100 MG PO CAPS
100.0000 mg | ORAL_CAPSULE | Freq: Two times a day (BID) | ORAL | Status: DC
  Administered 2016-07-31 – 2016-08-01 (×2): 100 mg via ORAL
  Filled 2016-07-31 (×2): qty 1

## 2016-07-31 MED ORDER — DIVALPROEX SODIUM ER 250 MG PO TB24
250.0000 mg | ORAL_TABLET | Freq: Every day | ORAL | Status: DC
  Administered 2016-08-01: 250 mg via ORAL
  Filled 2016-07-31: qty 1

## 2016-07-31 MED ORDER — DEXAMETHASONE SODIUM PHOSPHATE 10 MG/ML IJ SOLN
INTRAMUSCULAR | Status: AC
  Filled 2016-07-31: qty 1

## 2016-07-31 MED ORDER — RIVAROXABAN 10 MG PO TABS
10.0000 mg | ORAL_TABLET | Freq: Every day | ORAL | Status: DC
  Administered 2016-08-01: 09:00:00 10 mg via ORAL
  Filled 2016-07-31: qty 1

## 2016-07-31 MED ORDER — KETOROLAC TROMETHAMINE 15 MG/ML IJ SOLN
7.5000 mg | Freq: Four times a day (QID) | INTRAMUSCULAR | Status: DC
  Administered 2016-07-31 – 2016-08-01 (×2): 7.5 mg via INTRAVENOUS
  Filled 2016-07-31 (×2): qty 1

## 2016-07-31 MED ORDER — BUPIVACAINE-EPINEPHRINE (PF) 0.25% -1:200000 IJ SOLN
INTRAMUSCULAR | Status: AC
  Filled 2016-07-31: qty 30

## 2016-07-31 MED ORDER — FENTANYL CITRATE (PF) 100 MCG/2ML IJ SOLN
INTRAMUSCULAR | Status: DC | PRN
  Administered 2016-07-31: 75 ug via INTRAVENOUS
  Administered 2016-07-31: 25 ug via INTRAVENOUS

## 2016-07-31 MED ORDER — TRANEXAMIC ACID 1000 MG/10ML IV SOLN
1000.0000 mg | Freq: Once | INTRAVENOUS | Status: AC
  Administered 2016-07-31: 20:00:00 1000 mg via INTRAVENOUS
  Filled 2016-07-31: qty 1100

## 2016-07-31 MED ORDER — PHENOL 1.4 % MT LIQD
1.0000 | OROMUCOSAL | Status: DC | PRN
  Filled 2016-07-31: qty 177

## 2016-07-31 MED ORDER — ISOPROPYL ALCOHOL 70 % SOLN
Status: DC | PRN
  Administered 2016-07-31: 1 via TOPICAL

## 2016-07-31 MED ORDER — DIPHENHYDRAMINE HCL 12.5 MG/5ML PO ELIX: 12.5000 mg | ORAL_SOLUTION | ORAL | Status: DC | PRN

## 2016-07-31 MED ORDER — PROPOFOL 10 MG/ML IV BOLUS
INTRAVENOUS | Status: DC | PRN
  Administered 2016-07-31 (×2): 10 mg via INTRAVENOUS

## 2016-07-31 MED ORDER — METOCLOPRAMIDE HCL 5 MG/ML IJ SOLN: 5.0000 mg | Freq: Three times a day (TID) | INTRAMUSCULAR | Status: DC | PRN

## 2016-07-31 MED ORDER — PROPOFOL 500 MG/50ML IV EMUL
INTRAVENOUS | Status: DC | PRN
  Administered 2016-07-31: 50 ug/kg/min via INTRAVENOUS

## 2016-07-31 MED ORDER — BUPIVACAINE HCL (PF) 0.5 % IJ SOLN
INTRAMUSCULAR | Status: AC
  Filled 2016-07-31: qty 30

## 2016-07-31 MED ORDER — BUPIVACAINE-EPINEPHRINE 0.25% -1:200000 IJ SOLN
INTRAMUSCULAR | Status: DC | PRN
  Administered 2016-07-31: 30 mL

## 2016-07-31 MED ORDER — EPHEDRINE SULFATE-NACL 50-0.9 MG/10ML-% IV SOSY
PREFILLED_SYRINGE | INTRAVENOUS | Status: DC | PRN
  Administered 2016-07-31: 5 mg via INTRAVENOUS

## 2016-07-31 MED ORDER — ACETAMINOPHEN 325 MG PO TABS: 650.0000 mg | ORAL_TABLET | Freq: Four times a day (QID) | ORAL | Status: DC | PRN

## 2016-07-31 MED ORDER — LISINOPRIL 10 MG PO TABS: 10.0000 mg | ORAL_TABLET | Freq: Every day | ORAL | Status: DC

## 2016-07-31 MED ORDER — ATORVASTATIN CALCIUM 40 MG PO TABS
40.0000 mg | ORAL_TABLET | Freq: Every day | ORAL | Status: DC
  Administered 2016-07-31: 40 mg via ORAL
  Filled 2016-07-31: qty 1

## 2016-07-31 MED ORDER — METHOCARBAMOL 500 MG PO TABS: 500.0000 mg | ORAL_TABLET | Freq: Four times a day (QID) | ORAL | Status: DC | PRN

## 2016-07-31 MED ORDER — BUPIVACAINE HCL (PF) 0.5 % IJ SOLN
INTRAMUSCULAR | Status: DC | PRN
  Administered 2016-07-31: 3 mL

## 2016-07-31 MED ORDER — ACETAMINOPHEN 650 MG RE SUPP: 650.0000 mg | Freq: Four times a day (QID) | RECTAL | Status: DC | PRN

## 2016-07-31 MED ORDER — TRANEXAMIC ACID 1000 MG/10ML IV SOLN
1000.0000 mg | INTRAVENOUS | Status: AC
  Administered 2016-07-31: 1000 mg via INTRAVENOUS
  Filled 2016-07-31: qty 10
  Filled 2016-07-31: qty 1100

## 2016-07-31 MED ORDER — LIDOCAINE HCL (PF) 1 % IJ SOLN
INTRAMUSCULAR | Status: DC | PRN
  Administered 2016-07-31: 2 mL

## 2016-07-31 MED ORDER — HYDROCODONE-ACETAMINOPHEN 5-325 MG PO TABS: 1.0000 | ORAL_TABLET | ORAL | Status: DC | PRN

## 2016-07-31 MED ORDER — FENTANYL CITRATE (PF) 100 MCG/2ML IJ SOLN
INTRAMUSCULAR | Status: AC
  Filled 2016-07-31: qty 2

## 2016-07-31 SURGICAL SUPPLY — 56 items
ADH SKN CLS APL DERMABOND .7 (GAUZE/BANDAGES/DRESSINGS) ×1
BAG SPEC THK2 15X12 ZIP CLS (MISCELLANEOUS) ×1
BAG ZIPLOCK 12X15 (MISCELLANEOUS) ×2 IMPLANT
CAPT HIP TOTAL 2 ×2 IMPLANT
CHLORAPREP W/TINT 26ML (MISCELLANEOUS) ×3 IMPLANT
CLOTH BEACON ORANGE TIMEOUT ST (SAFETY) ×3 IMPLANT
COVER PERINEAL POST (MISCELLANEOUS) ×3 IMPLANT
COVER SURGICAL LIGHT HANDLE (MISCELLANEOUS) ×3 IMPLANT
DECANTER SPIKE VIAL GLASS SM (MISCELLANEOUS) ×3 IMPLANT
DERMABOND ADVANCED (GAUZE/BANDAGES/DRESSINGS) ×2
DERMABOND ADVANCED .7 DNX12 (GAUZE/BANDAGES/DRESSINGS) ×2 IMPLANT
DRAPE SHEET LG 3/4 BI-LAMINATE (DRAPES) ×9 IMPLANT
DRAPE STERI IOBAN 125X83 (DRAPES) ×3 IMPLANT
DRAPE U-SHAPE 47X51 STRL (DRAPES) ×6 IMPLANT
DRESSING AQUACEL AG SP 3.5X10 (GAUZE/BANDAGES/DRESSINGS) IMPLANT
DRSG AQUACEL AG ADV 3.5X10 (GAUZE/BANDAGES/DRESSINGS) ×3 IMPLANT
DRSG AQUACEL AG SP 3.5X10 (GAUZE/BANDAGES/DRESSINGS) ×3
ELECT PENCIL ROCKER SW 15FT (MISCELLANEOUS) ×3 IMPLANT
ELECT REM PT RETURN 15FT ADLT (MISCELLANEOUS) ×3 IMPLANT
GAUZE SPONGE 4X4 12PLY STRL (GAUZE/BANDAGES/DRESSINGS) ×3 IMPLANT
GLOVE BIO SURGEON STRL SZ7 (GLOVE) ×2 IMPLANT
GLOVE BIO SURGEON STRL SZ8.5 (GLOVE) ×6 IMPLANT
GLOVE BIOGEL PI IND STRL 6.5 (GLOVE) IMPLANT
GLOVE BIOGEL PI IND STRL 7.0 (GLOVE) IMPLANT
GLOVE BIOGEL PI IND STRL 7.5 (GLOVE) IMPLANT
GLOVE BIOGEL PI IND STRL 8.5 (GLOVE) ×1 IMPLANT
GLOVE BIOGEL PI INDICATOR 6.5 (GLOVE) ×2
GLOVE BIOGEL PI INDICATOR 7.0 (GLOVE) ×2
GLOVE BIOGEL PI INDICATOR 7.5 (GLOVE) ×8
GLOVE BIOGEL PI INDICATOR 8.5 (GLOVE) ×2
GLOVE ECLIPSE 6.5 STRL STRAW (GLOVE) ×2 IMPLANT
GLOVE SURG SS PI 7.5 STRL IVOR (GLOVE) ×4 IMPLANT
GOWN SPEC L3 XXLG W/TWL (GOWN DISPOSABLE) ×5 IMPLANT
GOWN STRL REUS W/ TWL XL LVL3 (GOWN DISPOSABLE) IMPLANT
GOWN STRL REUS W/TWL LRG LVL3 (GOWN DISPOSABLE) ×2 IMPLANT
GOWN STRL REUS W/TWL XL LVL3 (GOWN DISPOSABLE) ×5 IMPLANT
HANDPIECE INTERPULSE COAX TIP (DISPOSABLE) ×3
HOLDER FOLEY CATH W/STRAP (MISCELLANEOUS) ×3 IMPLANT
HOOD PEEL AWAY FLYTE STAYCOOL (MISCELLANEOUS) ×6 IMPLANT
MARKER SKIN DUAL TIP RULER LAB (MISCELLANEOUS) ×3 IMPLANT
NDL SPNL 18GX3.5 QUINCKE PK (NEEDLE) ×1 IMPLANT
NEEDLE SPNL 18GX3.5 QUINCKE PK (NEEDLE) ×3 IMPLANT
PACK ANTERIOR HIP CUSTOM (KITS) ×3 IMPLANT
SAW OSC TIP CART 19.5X105X1.3 (SAW) ×3 IMPLANT
SEALER BIPOLAR AQUA 6.0 (INSTRUMENTS) ×3 IMPLANT
SET HNDPC FAN SPRY TIP SCT (DISPOSABLE) ×1 IMPLANT
SUT ETHIBOND NAB CT1 #1 30IN (SUTURE) ×6 IMPLANT
SUT MNCRL AB 3-0 PS2 18 (SUTURE) ×3 IMPLANT
SUT MON AB 2-0 CT1 36 (SUTURE) ×6 IMPLANT
SUT STRATAFIX PDO 1 14 VIOLET (SUTURE) ×3
SUT STRATFX PDO 1 14 VIOLET (SUTURE) ×1
SUT VIC AB 2-0 CT1 27 (SUTURE) ×3
SUT VIC AB 2-0 CT1 TAPERPNT 27 (SUTURE) ×1 IMPLANT
SUTURE STRATFX PDO 1 14 VIOLET (SUTURE) ×1 IMPLANT
SYR 50ML LL SCALE MARK (SYRINGE) ×3 IMPLANT
YANKAUER SUCT BULB TIP 10FT TU (MISCELLANEOUS) ×3 IMPLANT

## 2016-07-31 NOTE — OR Nursing (Signed)
Pt. stated in pre-op that they were not allergic to Latex and it was okay to use for surgery. Surgeon notified.

## 2016-07-31 NOTE — Anesthesia Procedure Notes (Signed)
Spinal  Patient location during procedure: OR Start time: 07/31/2016 2:08 PM End time: 07/31/2016 2:13 PM Staffing Anesthesiologist: ROSE, Iona Beard Resident/CRNA: Darlys Gales R Performed: resident/CRNA  Preanesthetic Checklist Completed: patient identified, site marked, surgical consent, pre-op evaluation, timeout performed, IV checked, risks and benefits discussed and monitors and equipment checked Spinal Block Patient position: sitting Prep: DuraPrep Patient monitoring: heart rate, cardiac monitor, continuous pulse ox and blood pressure Approach: midline Location: L3-4 Injection technique: single-shot Needle Needle type: Sprotte  Needle gauge: 24 G Needle length: 9 cm Needle insertion depth: 7 cm Assessment Sensory level: T6

## 2016-07-31 NOTE — H&P (View-Only) (Signed)
TOTAL HIP ADMISSION H&P  Patient is admitted for right total hip arthroplasty.  Subjective:  Chief Complaint: right hip pain  HPI: [name] [name], 71 y.o. male, has a history of pain and functional disability in the right hip(s) due to arthritis and patient has failed non-surgical conservative treatments for greater than 12 weeks to include NSAID's and/or analgesics, flexibility and strengthening excercises, use of assistive devices, weight reduction as appropriate and activity modification.  Onset of symptoms was gradual starting >10 years ago with gradually worsening course since that time.The patient noted no past surgery on the right hip(s).  Patient currently rates pain in the right hip at 10 out of 10 with activity. Patient has night pain, worsening of pain with activity and weight bearing, pain that interfers with activities of daily living and pain with passive range of motion. Patient has evidence of subchondral cysts, subchondral sclerosis, periarticular osteophytes and joint space narrowing by imaging studies. This condition presents safety issues increasing the risk of falls. There is no current active infection.  Patient Active Problem List   Diagnosis Date Noted  . Weakness 02/10/2016  . Mixed Alzheimer's and vascular dementia 10/28/2013  . Parkinsonian features 03/31/2013  . History of stroke 03/23/2013  . Bipolar disorder ([place]) 03/20/2013  . Hyperlipidemia 03/20/2013  . Brain TIA 03/18/2013  . Gait instability 03/17/2013  . Chronic atrial fibrillation ([place]) 03/17/2013  . Essential hypertension 03/17/2013   Past Medical History:  Diagnosis Date  . Alzheimer disease   . Atrial fibrillation (H[facility])    Chronic  . Bipolar 1 disorder ([place])   . Dementia   . ED (erectile dysfunction)   . HLD (hyperlipidemia)   . HTN (hypertension)   . Lipoma of skin   . Mixed Alzheimer's and vascular dementia   . Other and unspecified hyperlipidemia 03/20/2013  . Parkinsons ([place])   . Poor  balance   . Stroke ([place]) 1991  . Subclinical hypothyroidism   . Tremor     Past Surgical History:  Procedure Laterality Date  . TONSILLECTOMY AND ADENOIDECTOMY       (Not in a hospital admission) Allergies  Allergen Reactions  . Latex Rash    Reaction to knee wraps    Social History  Substance Use Topics  . Smoking status: Never Smoker  . Smokeless tobacco: Never Used  . Alcohol use No     Comment: occasional    Family History  Problem Relation Age of Onset  . Suicidality Unknown   . Cancer Unknown   . Alcohol abuse Unknown   . Cancer Mother   . Suicidality Father      Review of Systems  Constitutional: Positive for malaise/fatigue.  HENT: Negative.   Eyes: Positive for double vision.  Respiratory: Negative.   Cardiovascular: Negative.   Gastrointestinal: Negative.   Genitourinary: Negative.   Musculoskeletal: Positive for joint pain.  Skin: Positive for itching and rash.  Neurological: Positive for tremors.  Psychiatric/Behavioral: Negative.     Objective:  Physical Exam  Vitals reviewed. Constitutional: He is oriented to person, place, and time. He appears well-developed and well-nourished.  HENT:  Head: Normocephalic and atraumatic.  Eyes: Conjunctivae and EOM are normal. Pupils are equal, round, and reactive to light.  Neck: Normal range of motion. Neck supple.  Cardiovascular: Normal rate, regular rhythm and intact distal pulses.   Respiratory: Effort normal. No respiratory distress.  GI: Soft. He exhibits no distension.  Genitourinary:  Genitourinary Comments: deferred  Musculoskeletal:  Right hip: He exhibits decreased range of motion and bony tenderness.  Neurological: He is alert and oriented to person, place, and time. He has normal reflexes.  Skin: Skin is warm and dry.  Psychiatric: He has a normal mood and affect. His behavior is normal. Judgment and thought content normal.    Vital signs in last 24  hours: @VSRANGES @  Labs:   Estimated body mass index is 27.98 kg/m as calculated from the following:   Height as of 04/11/16: 5\' 11"  (1.803 m).   Weight as of 04/11/16: 91 kg (200 lb 9.6 oz).   Imaging Review Plain radiographs demonstrate severe degenerative joint disease of the right hip(s). The bone quality appears to be adequate for age and reported activity level.  Assessment/Plan:  End stage arthritis, right hip(s)  The patient history, physical examination, clinical judgement of the provider and imaging studies are consistent with end stage degenerative joint disease of the right hip(s) and total hip arthroplasty is deemed medically necessary. The treatment options including medical management, injection therapy, arthroscopy and arthroplasty were discussed at length. The risks and benefits of total hip arthroplasty were presented and reviewed. The risks due to aseptic loosening, infection, stiffness, dislocation/subluxation,  thromboembolic complications and other imponderables were discussed.  The patient acknowledged the explanation, agreed to proceed with the plan and consent was signed. Patient is being admitted for inpatient treatment for surgery, pain control, PT, OT, prophylactic antibiotics, VTE prophylaxis, progressive ambulation and ADL's and discharge planning.The patient is planning to be discharged home with HEP.

## 2016-07-31 NOTE — Discharge Instructions (Signed)
°Dr. Larron Armor °Joint Replacement Specialist °[facility] °[address] °[place], [place] [address] °[phone] ° ° °TOTAL HIP REPLACEMENT POSTOPERATIVE DIRECTIONS ° ° ° °Hip Rehabilitation, Guidelines Following Surgery  ° °WEIGHT BEARING °Weight bearing as tolerated with assist device (walker, cane, etc) as directed, use it as long as suggested by your surgeon or therapist, typically at least 4-6 weeks. ° °The results of a hip operation are greatly improved after range of motion and muscle strengthening exercises. Follow all safety measures which are given to protect your hip. If any of these exercises cause increased pain or swelling in your joint, decrease the amount until you are comfortable again. Then slowly increase the exercises. Call your caregiver if you have problems or questions.  ° °HOME CARE INSTRUCTIONS  °Most of the following instructions are designed to prevent the dislocation of your new hip.  °Remove items at home which could result in a fall. This includes throw rugs or furniture in walking pathways.  °Continue medications as instructed at time of discharge. °· You may have some home medications which will be placed on hold until you complete the course of blood thinner medication. °· You may start showering once you are discharged home. Do not remove your dressing. °Do not put on socks or shoes without following the instructions of your caregivers.   °Sit on chairs with arms. Use the chair arms to help push yourself up when arising.  °Arrange for the use of a toilet seat elevator so you are not sitting low.  °· Walk with walker as instructed.  °You may resume a sexual relationship in one month or when given the OK by your caregiver.  °Use walker as long as suggested by your caregivers.  °You may put full weight on your legs and walk as much as is comfortable. °Avoid periods of inactivity such as sitting longer than an hour when not asleep. This helps prevent  blood clots.  °You may return to work once you are cleared by your surgeon.  °Do not drive a car for 6 weeks or until released by your surgeon.  °Do not drive while taking narcotics.  °Wear elastic stockings for two weeks following surgery during the day but you may remove then at night.  °Make sure you keep all of your appointments after your operation with all of your doctors and caregivers. You should call the office at the above phone number and make an appointment for approximately two weeks after the date of your surgery. °Please pick up a stool softener and laxative for home use as long as you are requiring pain medications. °· ICE to the affected hip every three hours for 30 minutes at a time and then as needed for pain and swelling. Continue to use ice on the hip for pain and swelling from surgery. You may notice swelling that will progress down to the foot and ankle.  This is normal after surgery.  Elevate the leg when you are not up walking on it.   °It is important for you to complete the blood thinner medication as prescribed by your doctor. °· Continue to use the breathing machine which will help keep your temperature down.  It is common for your temperature to cycle up and down following surgery, especially at night when you are not up moving around and exerting yourself.  The breathing machine keeps your lungs expanded and your temperature down. ° °RANGE OF MOTION AND STRENGTHENING EXERCISES  °These exercises are   designed to help you keep full movement of your hip joint. Follow your caregiver's or physical therapist's instructions. Perform all exercises about fifteen times, three times per day or as directed. Exercise both hips, even if you have had only one joint replacement. These exercises can be done on a training (exercise) mat, on the floor, on a table or on a bed. Use whatever works the best and is most comfortable for you. Use music or television while you are exercising so that the exercises  are a pleasant break in your day. This will make your life better with the exercises acting as a break in routine you can look forward to.  °Lying on your back, slowly slide your foot toward your buttocks, raising your knee up off the floor. Then slowly slide your foot back down until your leg is straight again.  °Lying on your back spread your legs as far apart as you can without causing discomfort.  °Lying on your side, raise your upper leg and foot straight up from the floor as far as is comfortable. Slowly lower the leg and repeat.  °Lying on your back, tighten up the muscle in the front of your thigh (quadriceps muscles). You can do this by keeping your leg straight and trying to raise your heel off the floor. This helps strengthen the largest muscle supporting your knee.  °Lying on your back, tighten up the muscles of your buttocks both with the legs straight and with the knee bent at a comfortable angle while keeping your heel on the floor.  ° °SKILLED REHAB INSTRUCTIONS: °If the patient is transferred to a skilled rehab facility following release from the hospital, a list of the current medications will be sent to the facility for the patient to continue.  When discharged from the skilled rehab facility, please have the facility set up the patient's Home Health Physical Therapy prior to being released. Also, the skilled facility will be responsible for providing the patient with their medications at time of release from the facility to include their pain medication and their blood thinner medication. If the patient is still at the rehab facility at time of the two week follow up appointment, the skilled rehab facility will also need to assist the patient in arranging follow up appointment in our office and any transportation needs. ° °MAKE SURE YOU:  °Understand these instructions.  °Will watch your condition.  °Will get help right away if you are not doing well or get worse. ° °Pick up stool softner and  laxative for home use following surgery while on pain medications. °Do not remove your dressing. °The dressing is waterproof--it is OK to take showers. °Continue to use ice for pain and swelling after surgery. °Do not use any lotions or creams on the incision until instructed by your surgeon. °Total Hip Protocol. ° ° °

## 2016-07-31 NOTE — Anesthesia Preprocedure Evaluation (Signed)
Anesthesia Evaluation  Patient identified by MRN, date of birth, ID band Patient awake    Reviewed: Allergy & Precautions, NPO status , Patient's Chart, lab work & pertinent test results  Airway Mallampati: II  TM Distance: >3 FB Neck ROM: Full    Dental no notable dental hx.    Pulmonary sleep apnea and Continuous Positive Airway Pressure Ventilation ,    Pulmonary exam normal breath sounds clear to auscultation       Cardiovascular hypertension, + dysrhythmias Atrial Fibrillation  Rhythm:Irregular Rate:Normal     Neuro/Psych negative neurological ROS  negative psych ROS   GI/Hepatic negative GI ROS, Neg liver ROS,   Endo/Other  negative endocrine ROS  Renal/GU negative Renal ROS  negative genitourinary   Musculoskeletal negative musculoskeletal ROS (+)   Abdominal   Peds negative pediatric ROS (+)  Hematology negative hematology ROS (+)   Anesthesia Other Findings   Reproductive/Obstetrics negative OB ROS                             Anesthesia Physical Anesthesia Plan  ASA: III  Anesthesia Plan: Spinal   Post-op Pain Management:    Induction: Intravenous  Airway Management Planned: Simple Face Mask  Additional Equipment:   Intra-op Plan:   Post-operative Plan:   Informed Consent: I have reviewed the patients History and Physical, chart, labs and discussed the procedure including the risks, benefits and alternatives for the proposed anesthesia with the patient or authorized representative who has indicated his/her understanding and acceptance.   Dental advisory given  Plan Discussed with: CRNA and Surgeon  Anesthesia Plan Comments:         Anesthesia Quick Evaluation

## 2016-07-31 NOTE — Op Note (Signed)
OPERATIVE REPORT  SURGEON: Rod Can, MD   ASSISTANT: Staff.  PREOPERATIVE DIAGNOSIS: Right hip arthritis.   POSTOPERATIVE DIAGNOSIS: Right hip arthritis.   PROCEDURE: Right total hip arthroplasty, anterior approach.   IMPLANTS: DePuy Tri Lock stem, size 4, hi offset. DePuy Pinnacle Cup, size 60 mm. DePuy Altrx liner, size 36 by 60 mm, neutral. DePuy metal head ball, size 36 + -2 mm.  ANESTHESIA:  Spinal  ESTIMATED BLOOD LOSS: 350 mL.  ANTIBIOTICS: 2 g Ancef.  DRAINS: None.  COMPLICATIONS: None.   CONDITION: PACU - hemodynamically stable.   BRIEF CLINICAL NOTE: [name] [name] is a 71 y.o. male with a long-standing history of Right hip arthritis. After failing conservative management, the patient was indicated for total hip arthroplasty. The risks, benefits, and alternatives to the procedure were explained, and the patient elected to proceed.  PROCEDURE IN DETAIL: Surgical site was marked by myself in the pre-op holding area. Once inside the operating room, spinal anesthesia was obtained, and a foley catheter was inserted. The patient was then positioned on the Hana table. All bony prominences were well padded. The hip was prepped and draped in the normal sterile surgical fashion. A time-out was called verifying side and site of surgery. The patient received IV antibiotics within 60 minutes of beginning the procedure.  The direct anterior approach to the hip was performed through the Hueter interval. Lateral femoral circumflex vessels were treated with the Auqumantys. The anterior capsule was exposed and an inverted T capsulotomy was made.The femoral neck cut was made to the level of the templated cut. A corkscrew was placed into the head and the head was removed. The femoral head was found to have eburnated bone. The head was passed to the back table and was measured.  Acetabular exposure was achieved, and the pulvinar and labrum were excised. Sequental  reaming of the acetabulum was then performed up to a size 59 mm reamer. A 60 mm cup was then opened and impacted into place at approximately 40 degrees of abduction and 20 degrees of anteversion. The final polyethylene liner was impacted into place and acetabular osteophytes were removed.   I then gained femoral exposure taking care to protect the abductors and greater trochanter. This was performed using standard external rotation, extension, and adduction. The capsule was peeled off the inner aspect of the greater trochanter, taking care to preserve the short external rotators. A cookie cutter was used to enter the femoral canal, and then the femoral canal finder was placed. Sequential broaching was performed up to a size 4. Calcar planer was used on the femoral neck remnant. I placed a hi offset neck and a trial head ball. The hip was reduced. Leg lengths and offset were checked fluoroscopically. The hip was dislocated and trial components were removed. The final implants were placed, and the hip was reduced.  Fluoroscopy was used to confirm component position and leg lengths. At 90 degrees of external rotation and full extension, the hip was stable to an anterior directed force.  The wound was copiously irrigated with normal saline using pulse lavage. Marcaine solution was injected into the periarticular soft tissue. The wound was closed in layers using #1 Vicryl and V-Loc for the fascia, 2-0 Vicryl for the subcutaneous fat, 2-0 Monocryl for the deep dermal layer, 3-0 running Monocryl subcuticular stitch, and Dermabond for the skin. Once the glue was fully dried, an Aquacell Ag dressing was applied. The patient was transported to the recovery room in stable condition. Sponge,  needle, and instrument counts were correct at the end of the case x2. The patient tolerated the procedure well and there were no known complications.

## 2016-07-31 NOTE — Transfer of Care (Signed)
Immediate Anesthesia Transfer of Care Note  Patient: [name] [name]  Procedure(s) Performed: Procedure(s) with comments: RIGHT TOTAL HIP ARTHROPLASTY ANTERIOR APPROACH (Right) - Requesting RNFA  Patient Location: PACU  Anesthesia Type:Spinal  Level of Consciousness:  sedated, patient cooperative and responds to stimulation  Airway & Oxygen Therapy:Patient Spontanous Breathing and Patient connected to face mask oxgen  Post-op Assessment:  Report given to PACU RN and Post -op Vital signs reviewed and stable  Post vital signs:  Reviewed and stable  Last Vitals:  Vitals:   07/31/16 1120  BP: 124/74  Pulse: 65  Resp: 18  Temp: 53.6 C    Complications: No apparent anesthesia complications

## 2016-07-31 NOTE — Interval H&P Note (Signed)
History and Physical Interval Note:  07/31/2016 12:27 PM  [name] [name]  has presented today for surgery, with the diagnosis of Degenerative joint disease right hip  The various methods of treatment have been discussed with the patient and family. After consideration of risks, benefits and other options for treatment, the patient has consented to  Procedure(s) with comments: RIGHT TOTAL HIP ARTHROPLASTY ANTERIOR APPROACH (Right) - Requesting RNFA as a surgical intervention .  The patient's history has been reviewed, patient examined, no change in status, stable for surgery.  I have reviewed the patient's chart and labs.  Questions were answered to the patient's satisfaction.     Jacqualine Weichel, Horald Pollen

## 2016-07-31 NOTE — Anesthesia Postprocedure Evaluation (Signed)
Anesthesia Post Note  Patient: [name] [name]  Procedure(s) Performed: Procedure(s) (LRB): RIGHT TOTAL HIP ARTHROPLASTY ANTERIOR APPROACH (Right)     Patient location during evaluation: PACU Anesthesia Type: Spinal Level of consciousness: oriented and awake and alert Pain management: pain level controlled Vital Signs Assessment: post-procedure vital signs reviewed and stable Respiratory status: spontaneous breathing, respiratory function stable and patient connected to nasal cannula oxygen Cardiovascular status: blood pressure returned to baseline and stable Postop Assessment: no headache and no backache Anesthetic complications: no    Last Vitals:  Vitals:   07/31/16 1730 07/31/16 1745  BP: 120/65 132/78  Pulse: (!) 57 (!) 58  Resp: 14 (!) 27  Temp: (!) 35.6 C 36.3 C    Last Pain:  Vitals:   07/31/16 1120  TempSrc: Oral                 Shawan Tosh S

## 2016-08-01 ENCOUNTER — Encounter (HOSPITAL_COMMUNITY): Payer: Self-pay | Admitting: Orthopedic Surgery

## 2016-08-01 LAB — BASIC METABOLIC PANEL
ANION GAP: 7 (ref 5–15)
BUN: 14 mg/dL (ref 6–20)
CHLORIDE: 105 mmol/L (ref 101–111)
CO2: 26 mmol/L (ref 22–32)
Calcium: 8.9 mg/dL (ref 8.9–10.3)
Creatinine, Ser: 0.91 mg/dL (ref 0.61–1.24)
Glucose, Bld: 133 mg/dL — ABNORMAL HIGH (ref 65–99)
POTASSIUM: 4.2 mmol/L (ref 3.5–5.1)
SODIUM: 138 mmol/L (ref 135–145)

## 2016-08-01 LAB — CBC
HCT: 31.5 % — ABNORMAL LOW (ref 39.0–52.0)
HEMOGLOBIN: 10.4 g/dL — AB (ref 13.0–17.0)
MCH: 31.1 pg (ref 26.0–34.0)
MCHC: 33 g/dL (ref 30.0–36.0)
MCV: 94.3 fL (ref 78.0–100.0)
PLATELETS: 142 10*3/uL — AB (ref 150–400)
RBC: 3.34 MIL/uL — AB (ref 4.22–5.81)
RDW: 12.8 % (ref 11.5–15.5)
WBC: 11.5 10*3/uL — AB (ref 4.0–10.5)

## 2016-08-01 MED ORDER — ONDANSETRON HCL 4 MG PO TABS: 4.0000 mg | ORAL_TABLET | Freq: Four times a day (QID) | ORAL | 0 refills | Status: DC | PRN

## 2016-08-01 MED ORDER — DOCUSATE SODIUM 100 MG PO CAPS: 100.0000 mg | ORAL_CAPSULE | Freq: Two times a day (BID) | ORAL | 1 refills | Status: DC

## 2016-08-01 MED ORDER — HYDROCODONE-ACETAMINOPHEN 5-325 MG PO TABS: 1.0000 | ORAL_TABLET | ORAL | 0 refills | Status: DC | PRN

## 2016-08-01 MED ORDER — SENNA 8.6 MG PO TABS: 2.0000 | ORAL_TABLET | Freq: Every day | ORAL | 0 refills | Status: DC

## 2016-08-01 NOTE — Progress Notes (Signed)
08/01/16 1300  PT Visit Information  Last PT Received On 08/01/16  Assistance Needed +1  History of Present Illness Pt is a 71 year old male s/p R direct anterior THA with hx significant for but not limited to dementia, CVA, poor balance, Alzheimer's, tremor  Subjective Data  Subjective Pt ambulated in hallway and practiced steps multiple times with spouse present.  Pt also performed LE exercises and provided HEP handout.  Requested pt wait to perform standing exercises with home health physical therapist for safety reasons.  Pt with difficulty remembering stair sequence despite multiple teachback attempts however spouse able to verbalize and written on HEP handout for reference.  Pt and spouse prefer to d/c home today.  Recommended pt mobilize with spouse present for safety as he presents with impulsivity (or possibly due to poor memory of not getting up alone).  Pt to d/c home today and f/u with HHPT.  Precautions  Precautions Fall  Restrictions  Other Position/Activity Restrictions WBAT  Pain Assessment  Pain Assessment 0-10  Pain Score 2  Pain Location R hip  Pain Descriptors / Indicators ("stinging")  Pain Intervention(s) Limited activity within patient's tolerance;Monitored during session;Repositioned  Cognition  Arousal/Alertness Awake/alert  Behavior During Therapy WFL for tasks assessed/performed  Overall Cognitive Status Impaired/Different from baseline  Area of Impairment Safety/judgement  Safety/Judgement Decreased awareness of safety;Decreased awareness of deficits  General Comments a little impulsive, hx dementia  Bed Mobility  General bed mobility comments pt up in recliner  Transfers  Overall transfer level Needs assistance  Equipment used Rolling walker (2 wheeled)  Transfers Sit to/from Stand  Sit to Stand Min guard  General transfer comment min/guard for safety, verbal cues for safe technique  Ambulation/Gait  Ambulation/Gait assistance Min guard  Ambulation  Distance (Feet) 160 Feet  Assistive device Rolling walker (2 wheeled)  Gait Pattern/deviations Step-through pattern;Decreased stride length;Antalgic;Trunk flexed  General Gait Details verbal cues for safe use of RW, posture  Stairs Yes  Stairs assistance Min guard  Stair Management One rail Right;Step to pattern;Forwards  Number of Stairs 5  General stair comments pt performed ascending and descending over 2 and 3 steps x4.  Pt attempting to perform prior to cues, min/guard for safety, max cues for correct sequence and only using one rail (since pt has flight to bedroom with only one reachable rail), spouse present and educated as well  Exercises  Exercises Total Joint  Total Joint Exercises  Ankle Circles/Pumps AROM;10 reps;Both  Quad Sets AROM;Both;10 reps  Heel Slides AROM;Right;10 reps  Hip ABduction/ADduction AROM;Right;10 reps  Long Arc Quad AROM;Right;10 reps;Seated  Marching in Standing Seated;AROM;10 reps;Right  PT - End of Session  Activity Tolerance Patient tolerated treatment well  Patient left in chair;with call bell/phone within reach;with family/visitor present  PT - Assessment/Plan  PT Plan Current plan remains appropriate  PT Visit Diagnosis Other abnormalities of gait and mobility (R26.89)  PT Frequency (ACUTE ONLY) 7X/week  Follow Up Recommendations Home health PT  PT equipment Rolling walker with 5" wheels  AM-PAC PT "6 Clicks" Daily Activity Outcome Measure  Difficulty turning over in bed (including adjusting bedclothes, sheets and blankets)? 4  Difficulty moving from lying on back to sitting on the side of the bed?  4  Difficulty sitting down on and standing up from a chair with arms (e.g., wheelchair, bedside commode, etc,.)? 3  Help needed moving to and from a bed to chair (including a wheelchair)? 3  Help needed walking in hospital room? 3  Help needed climbing 3-5 steps with a railing?  3  6 Click Score 20  Mobility G Code  CJ  PT Goal Progression   Progress towards PT goals Progressing toward goals  PT Time Calculation  PT Start Time (ACUTE ONLY) 1251  PT Stop Time (ACUTE ONLY) 1309  PT Time Calculation (min) (ACUTE ONLY) 18 min  PT General Charges  $$ ACUTE PT VISIT 1 Procedure  PT Treatments  $Gait Training 8-22 mins   Carmelia Bake, PT, DPT 08/01/2016 Pager: [phone]

## 2016-08-01 NOTE — Progress Notes (Signed)
   Subjective:  Patient reports pain as mild to moderate.  Denies N/V/CP/SPB.  Objective:   VITALS:   Vitals:   07/31/16 2157 07/31/16 2326 08/01/16 0044 08/01/16 0447  BP: (!) 107/54  (!) 145/56 130/64  Pulse: 78  85 76  Resp: 16 18 16 16   Temp: 98.2 F (36.8 C)  98.4 F (36.9 C) 98 F (36.7 C)  TempSrc: Oral  Oral Oral  SpO2: 99%  96% 98%  Weight:      Height:        NAD ABD soft Sensation intact distally Intact pulses distally Dorsiflexion/Plantar flexion intact Incision: dressing C/D/I Compartment soft   Lab Results  Component Value Date   WBC 11.5 (H) 08/01/2016   HGB 10.4 (L) 08/01/2016   HCT 31.5 (L) 08/01/2016   MCV 94.3 08/01/2016   PLT 142 (L) 08/01/2016   BMET    Component Value Date/Time   NA 138 08/01/2016 0438   NA 144 02/18/2016   K 4.2 08/01/2016 0438   CL 105 08/01/2016 0438   CO2 26 08/01/2016 0438   GLUCOSE 133 (H) 08/01/2016 0438   BUN 14 08/01/2016 0438   BUN 18 02/18/2016   CREATININE 0.91 08/01/2016 0438   CREATININE 1.01 07/27/2013 0001   CALCIUM 8.9 08/01/2016 0438   GFRNONAA >60 08/01/2016 0438   GFRAA >60 08/01/2016 0438     Assessment/Plan: 1 Day Post-Op   Principal Problem:   Primary osteoarthritis of right hip   WBAT with walker Xarelto, SCDs, TEDs PO pain control PT/OT Dispo: D/C home with HEP   Adalae Baysinger, Horald Pollen 08/01/2016, 9:18 AM   Rod Can, MD Cell [phone]

## 2016-08-01 NOTE — Discharge Summary (Signed)
Physician Discharge Summary  Patient ID: [name] [name] MRN: [id number] DOB/AGE: 1945/11/04 71 y.o.  Admit date: 07/31/2016 Discharge date: 08/01/2016  Admission Diagnoses:  Primary osteoarthritis of right hip  Discharge Diagnoses:  Principal Problem:   Primary osteoarthritis of right hip   Past Medical History:  Diagnosis Date  . Alzheimer disease   . Anxiety   . Arthritis   . Atrial fibrillation (HCC)    Chronic  . Dementia   . Depression   . Dysrhythmia    a fib  . ED (erectile dysfunction)   . HLD (hyperlipidemia)   . HTN (hypertension)   . Lipoma of skin   . Mixed Alzheimer's and vascular dementia   . Other and unspecified hyperlipidemia 03/20/2013  . Poor balance   . Sleep apnea    cpap  . Stroke ([place]) 1991  . Tremor     Surgeries: Procedure(s): RIGHT TOTAL HIP ARTHROPLASTY ANTERIOR APPROACH on 07/31/2016   Consultants (if any):   Discharged Condition: Improved  Hospital Course: KAZIMIERZ SPRINGBORN is an 71 y.o. male who was admitted 07/31/2016 with a diagnosis of Primary osteoarthritis of right hip and went to the operating room on 07/31/2016 and underwent the above named procedures.    He was given perioperative antibiotics:  Anti-infectives    Start     Dose/Rate Route Frequency Ordered Stop   07/31/16 2000  ceFAZolin (ANCEF) IVPB 2g/100 mL premix     2 g 200 mL/hr over 30 Minutes Intravenous Every 6 hours 07/31/16 1854 08/01/16 0314   07/31/16 1108  ceFAZolin (ANCEF) IVPB 2g/100 mL premix     2 g 200 mL/hr over 30 Minutes Intravenous On call to O.R. 07/31/16 1108 07/31/16 1419    .  He was given sequential compression devices, early ambulation, and xarelto for DVT prophylaxis.  He benefited maximally from the hospital stay and there were no complications.    Recent vital signs:  Vitals:   08/01/16 0447 08/01/16 0940  BP: 130/64 137/69  Pulse: 76 89  Resp: 16 16  Temp: 98 F (36.7 C) 98.6 F (37 C)    Recent laboratory studies:  Lab Results   Component Value Date   HGB 10.4 (L) 08/01/2016   HGB 12.4 (L) 07/23/2016   HGB 12.8 (A) 02/18/2016   Lab Results  Component Value Date   WBC 11.5 (H) 08/01/2016   PLT 142 (L) 08/01/2016   Lab Results  Component Value Date   INR 1.45 02/10/2016   Lab Results  Component Value Date   NA 138 08/01/2016   K 4.2 08/01/2016   CL 105 08/01/2016   CO2 26 08/01/2016   BUN 14 08/01/2016   CREATININE 0.91 08/01/2016   GLUCOSE 133 (H) 08/01/2016    Discharge Medications:   Allergies as of 08/01/2016      Reactions   Latex Rash   Reaction to knee wraps      Medication List    TAKE these medications   atorvastatin 40 MG tablet Commonly known as:  LIPITOR Take 40 mg by mouth at bedtime.   B COMPLEX PO Take 1 tablet by mouth daily.   carbidopa-levodopa 25-250 MG tablet Commonly known as:  SINEMET IR Take 1 tablet by mouth 2 (two) times daily.   divalproex 250 MG 24 hr tablet Commonly known as:  DEPAKOTE ER Take 1 tablet (250 mg total) by mouth daily.   divalproex 500 MG 24 hr tablet Commonly known as:  DEPAKOTE ER Take 1 tablet (  500 mg total) by mouth at bedtime.   docusate sodium 100 MG capsule Commonly known as:  COLACE Take 1 capsule (100 mg total) by mouth 2 (two) times daily.   donepezil 10 MG tablet Commonly known as:  ARICEPT Take 1 tablet (10 mg total) by mouth at bedtime.   Fish Oil 1000 MG Caps Take 1,000 mg by mouth daily.   fluocinonide gel 0.05 % Commonly known as:  LIDEX Apply 1 application topically 2 (two) times daily as needed (ECZEMA).   folic acid 1 MG tablet Commonly known as:  FOLVITE Take 1 mg by mouth at bedtime.   gabapentin 100 MG capsule Commonly known as:  NEURONTIN Take 200 mg by mouth 2 (two) times daily.   HYDROcodone-acetaminophen 5-325 MG tablet Commonly known as:  NORCO/VICODIN Take 1-2 tablets by mouth every 4 (four) hours as needed (breakthrough pain).   lamoTRIgine 150 MG tablet Commonly known as:  LAMICTAL Take 150  mg by mouth 2 (two) times daily.   lisinopril 10 MG tablet Commonly known as:  PRINIVIL,ZESTRIL Take 10 mg by mouth at bedtime.   memantine 10 MG tablet Commonly known as:  NAMENDA Take 10 mg by mouth at bedtime.   metroNIDAZOLE 0.75 % gel Commonly known as:  METROGEL Apply 1 application topically 2 (two) times daily.   multivitamin with minerals Tabs tablet Take 1 tablet by mouth daily.   ondansetron 4 MG tablet Commonly known as:  ZOFRAN Take 1 tablet (4 mg total) by mouth every 6 (six) hours as needed for nausea.   rivaroxaban 20 MG Tabs tablet Commonly known as:  XARELTO Take 20 mg by mouth at bedtime.   senna 8.6 MG Tabs tablet Commonly known as:  SENOKOT Take 2 tablets (17.2 mg total) by mouth at bedtime.       Diagnostic Studies: Dg Pelvis Portable  Result Date: 07/31/2016 CLINICAL DATA:  Postop day 0 right total hip arthroplasty. EXAM: PORTABLE PELVIS 1-2 VIEWS 4:37 p.m.: COMPARISON:  Intraoperative right hip x-ray earlier today 2:49 p.m. FINDINGS: Anatomic alignment in the AP projection post right total hip arthroplasty. No acute complicating features. Mild inferomedial cartilage loss in the contralateral left hip. IMPRESSION: Of anatomic alignment in the AP projection post right total hip arthroplasty without acute complicating features. Electronically Signed   By: Evangeline Dakin M.D.   On: 07/31/2016 16:59   Dg C-arm 61-120 Min-no Report  Result Date: 07/31/2016 CLINICAL DATA:  Right shoulder arthroplasty for primary osteoarthritis of the right hip. EXAM: OPERATIVE RIGHT HIP (WITH PELVIS IF PERFORMED)  VIEWS TECHNIQUE: Fluoroscopic spot image(s) were submitted for interpretation post-operatively. COMPARISON:  None. FINDINGS: AP images of the right hip were obtained. Anatomic alignment in the AP projection post right total hip arthroplasty. IMPRESSION: Anatomic alignment in the AP projection post right total hip arthroplasty. Electronically Signed   By: Evangeline Dakin M.D.   On: 07/31/2016 17:05   Dg Hip Operative Unilat With Pelvis Right  Result Date: 07/31/2016 CLINICAL DATA:  Right shoulder arthroplasty for primary osteoarthritis of the right hip. EXAM: OPERATIVE RIGHT HIP (WITH PELVIS IF PERFORMED)  VIEWS TECHNIQUE: Fluoroscopic spot image(s) were submitted for interpretation post-operatively. COMPARISON:  None. FINDINGS: AP images of the right hip were obtained. Anatomic alignment in the AP projection post right total hip arthroplasty. IMPRESSION: Anatomic alignment in the AP projection post right total hip arthroplasty. Electronically Signed   By: Evangeline Dakin M.D.   On: 07/31/2016 17:05    Disposition: Home  Discharge Instructions  Call MD / Call 911    Complete by:  As directed    If you experience chest pain or shortness of breath, CALL 911 and be transported to the hospital emergency room.  If you develope a fever above 101 F, pus (white drainage) or increased drainage or redness at the wound, or calf pain, call your surgeon's office.   Constipation Prevention    Complete by:  As directed    Drink plenty of fluids.  Prune juice may be helpful.  You may use a stool softener, such as Colace (over the counter) 100 mg twice a day.  Use MiraLax (over the counter) for constipation as needed.   Diet - low sodium heart healthy    Complete by:  As directed    Discharge instructions    Complete by:  As directed    Resume normal xarelto dose Saturday evening   Driving restrictions    Complete by:  As directed    No driving for 6 weeks   Increase activity slowly as tolerated    Complete by:  As directed    Lifting restrictions    Complete by:  As directed    No lifting for 6 weeks   TED hose    Complete by:  As directed    Use stockings (TED hose) for 2 weeks on both leg(s).  You may remove them at night for sleeping.      Follow-up Information    Rosabella Edgin, Aaron Edelman, MD. Schedule an appointment as soon as possible for a visit in 2  week(s).   Specialty:  Orthopedic Surgery Why:  For wound re-check Contact information: [address] [place] [address] [phone]        [facility].   Specialty:  [place] Why:  physical therapy Contact information: [address] [place] [address] [phone]            Signed: Elie Goody 08/03/2016, 9:43 AM

## 2016-08-01 NOTE — Evaluation (Signed)
Physical Therapy Evaluation Patient Details Name: [name] [name] MRN: [id number] DOB: March 14, 1945 Today's Date: 08/01/2016   History of Present Illness  Pt is a 71 year old male s/p R direct anterior THA with hx significant for but not limited to dementia, CVA, poor balance, Alzheimer's, tremor  Clinical Impression  Pt is s/p R THA resulting in the deficits listed below (see PT Problem List).  Pt will benefit from skilled PT to increase their independence and safety with mobility to allow discharge to the venue listed below.  Pt presents with slight impulsivity, and nursing has reported pt attempting to get up alone in room a few times this morning.  Pt typically does not use RW, prefers cane. No family present at time of evaluation however plan per chart is home with spouse.     Follow Up Recommendations Home health PT    Equipment Recommendations  Rolling walker with 5" wheels    Recommendations for Other Services       Precautions / Restrictions Precautions Precautions: Fall Restrictions Weight Bearing Restrictions: No Other Position/Activity Restrictions: WBAT      Mobility  Bed Mobility Overal bed mobility: Needs Assistance Bed Mobility: Supine to Sit     Supine to sit: Supervision        Transfers Overall transfer level: Needs assistance Equipment used: Rolling walker (2 wheeled) Transfers: Sit to/from Stand Sit to Stand: Min guard         General transfer comment: min/guard for safety, verbal cues for safe technique  Ambulation/Gait Ambulation/Gait assistance: Min guard Ambulation Distance (Feet): 200 Feet Assistive device: Rolling walker (2 wheeled) Gait Pattern/deviations: Step-through pattern;Decreased stride length;Antalgic;Trunk flexed     General Gait Details: verbal cues for safe use of RW, posture  Stairs            Wheelchair Mobility    Modified Rankin (Stroke Patients Only)       Balance                                             Pertinent Vitals/Pain Pain Assessment: No/denies pain    Home Living Family/patient expects to be discharged to:: Private residence Living Arrangements: Spouse/significant other Available Help at Discharge: Family;Available 24 hours/day Type of Home: House Home Access: Stairs to enter   [employer] of Steps: 1 Home Layout: Two level;Bed/bath upstairs Home Equipment: Walker - 2 wheels;Cane - single point;Grab bars - tub/shower Additional Comments: per admission 5 months ago, pt questionable historian    Prior Function Level of Independence: Independent with assistive device(s)         Comments: pt states he uses a cane     Hand Dominance        Extremity/Trunk Assessment        Lower Extremity Assessment Lower Extremity Assessment: RLE deficits/detail RLE Deficits / Details: anticipated post op hip weakness       Communication   Communication: No difficulties  Cognition Arousal/Alertness: Awake/alert Behavior During Therapy: WFL for tasks assessed/performed Overall Cognitive Status: Impaired/Different from baseline Area of Impairment: Safety/judgement                         Safety/Judgement: Decreased awareness of safety;Decreased awareness of deficits     General Comments: a little impulsive, hx dementia      General Comments  Exercises     Assessment/Plan    PT Assessment Patient needs continued PT services  PT Problem List Decreased strength;Decreased mobility;Decreased balance;Decreased knowledge of use of DME       PT Treatment Interventions Functional mobility training;Stair training;Gait training;Therapeutic exercise;DME instruction;Therapeutic activities;Patient/family education    PT Goals (Current goals can be found in the Care Plan section)  Acute Rehab PT Goals PT Goal Formulation: With patient Time For Goal Achievement: 08/04/16 Potential to Achieve Goals: Good    Frequency  7X/week   Barriers to discharge        Co-evaluation               AM-PAC PT "6 Clicks" Daily Activity  Outcome Measure Difficulty turning over in bed (including adjusting bedclothes, sheets and blankets)?: None Difficulty moving from lying on back to sitting on the side of the bed? : None Difficulty sitting down on and standing up from a chair with arms (e.g., wheelchair, bedside commode, etc,.)?: A Little Help needed moving to and from a bed to chair (including a wheelchair)?: A Little Help needed walking in hospital room?: A Little Help needed climbing 3-5 steps with a railing? : A Little 6 Click Score: 20    End of Session Equipment Utilized During Treatment: Gait belt Activity Tolerance: Patient tolerated treatment well Patient left: in chair;with call bell/phone within reach;with chair alarm set;with family/visitor present Nurse Communication: Mobility status PT Visit Diagnosis: Other abnormalities of gait and mobility (R26.89)    Time: 3790-2409 PT Time Calculation (min) (ACUTE ONLY): 13 min   Charges:   PT Evaluation $PT Eval Low Complexity: 1 Procedure     PT G CodesCarmelia Bake, PT, DPT 08/01/2016 Pager: [phone]  York Ram E 08/01/2016, 11:22 AM

## 2016-08-01 NOTE — Progress Notes (Signed)
Discharge planning, spoke with patient and spouse at bedside. [facility] chosen [facility]. Contact[facility] for referral. Has RW, contacted [facility] to deliver to room, declines 3n1. [phone]

## 2016-08-06 DIAGNOSIS — Z8659 Personal history of other mental and behavioral disorders: Secondary | ICD-10-CM | POA: Insufficient documentation

## 2016-08-06 DIAGNOSIS — H532 Diplopia: Secondary | ICD-10-CM | POA: Insufficient documentation

## 2016-08-06 DIAGNOSIS — Z8669 Personal history of other diseases of the nervous system and sense organs: Secondary | ICD-10-CM | POA: Insufficient documentation

## 2016-08-06 DIAGNOSIS — Z8679 Personal history of other diseases of the circulatory system: Secondary | ICD-10-CM | POA: Insufficient documentation

## 2016-10-27 ENCOUNTER — Encounter (HOSPITAL_COMMUNITY): Payer: Self-pay | Admitting: Emergency Medicine

## 2016-10-27 DIAGNOSIS — Z8673 Personal history of transient ischemic attack (TIA), and cerebral infarction without residual deficits: Secondary | ICD-10-CM | POA: Insufficient documentation

## 2016-10-27 DIAGNOSIS — G309 Alzheimer's disease, unspecified: Secondary | ICD-10-CM | POA: Insufficient documentation

## 2016-10-27 DIAGNOSIS — Z79899 Other long term (current) drug therapy: Secondary | ICD-10-CM | POA: Insufficient documentation

## 2016-10-27 DIAGNOSIS — F0280 Dementia in other diseases classified elsewhere without behavioral disturbance: Secondary | ICD-10-CM | POA: Diagnosis not present

## 2016-10-27 DIAGNOSIS — Z7901 Long term (current) use of anticoagulants: Secondary | ICD-10-CM | POA: Diagnosis not present

## 2016-10-27 DIAGNOSIS — Z91040 Latex allergy status: Secondary | ICD-10-CM | POA: Insufficient documentation

## 2016-10-27 DIAGNOSIS — Z96641 Presence of right artificial hip joint: Secondary | ICD-10-CM | POA: Insufficient documentation

## 2016-10-27 DIAGNOSIS — Y998 Other external cause status: Secondary | ICD-10-CM | POA: Insufficient documentation

## 2016-10-27 DIAGNOSIS — W228XXA Striking against or struck by other objects, initial encounter: Secondary | ICD-10-CM | POA: Insufficient documentation

## 2016-10-27 DIAGNOSIS — Y929 Unspecified place or not applicable: Secondary | ICD-10-CM | POA: Diagnosis not present

## 2016-10-27 DIAGNOSIS — I10 Essential (primary) hypertension: Secondary | ICD-10-CM | POA: Insufficient documentation

## 2016-10-27 DIAGNOSIS — F329 Major depressive disorder, single episode, unspecified: Secondary | ICD-10-CM | POA: Insufficient documentation

## 2016-10-27 DIAGNOSIS — Y9389 Activity, other specified: Secondary | ICD-10-CM | POA: Insufficient documentation

## 2016-10-27 DIAGNOSIS — S91312A Laceration without foreign body, left foot, initial encounter: Secondary | ICD-10-CM | POA: Insufficient documentation

## 2016-10-27 DIAGNOSIS — F419 Anxiety disorder, unspecified: Secondary | ICD-10-CM | POA: Diagnosis not present

## 2016-10-27 NOTE — ED Triage Notes (Addendum)
Pt presents with lac to L foot after dropping cockpot of soup; dressing applied and bleeding controlled; pt reports being on xarleto

## 2016-10-28 ENCOUNTER — Emergency Department (HOSPITAL_COMMUNITY): Payer: Medicare Other

## 2016-10-28 ENCOUNTER — Emergency Department (HOSPITAL_COMMUNITY)
Admission: EM | Admit: 2016-10-28 | Discharge: 2016-10-28 | Disposition: A | Discharge status: Home / Self Care | Payer: Medicare Other | Attending: Emergency Medicine | Admitting: Emergency Medicine

## 2016-10-28 DIAGNOSIS — S91312A Laceration without foreign body, left foot, initial encounter: Secondary | ICD-10-CM

## 2016-10-28 MED ORDER — LIDOCAINE-EPINEPHRINE (PF) 2 %-1:200000 IJ SOLN: 10.0000 mL | Freq: Once | INTRAMUSCULAR | Status: DC

## 2016-10-28 MED ORDER — LIDOCAINE HCL 2 % IJ SOLN
10.0000 mL | Freq: Once | INTRAMUSCULAR | Status: AC
  Administered 2016-10-28: 200 mg via INTRADERMAL
  Filled 2016-10-28: qty 20

## 2016-10-28 NOTE — Progress Notes (Signed)
Orthopedic Tech Progress Note Patient Details:  LESTER CRICKENBERGER 1945/06/06 [id number]  Patient ID: Leeann Must, male   DOB: 08/22/1945, 71 y.o.   MRN: [id number]   Hildred Priest 10/28/2016, 9:33 AM Pt unable to use crutches; RN and Doctor notified; order to be cancelled

## 2016-10-28 NOTE — ED Provider Notes (Signed)
[facility] DEPT Provider Note   CSN: [id number] Arrival date & time: 10/27/16  2338   History   Chief Complaint Chief Complaint  Patient presents with  . Laceration    L foot    HPI [name] [name] is a 71 y.o. male.  Patient presents with 2 lacerations to his left foot after stepping on shards of a dropped crockpot. His past medical history is significant for anxiety, A. fib, dementia, hyperlipidemia, hypertension, stroke at age 46, and tremor. He stated that he was transferring a crockpot from the counter into the refrigerator when it slipped and fell on the floor. He proceed to avoid stepping on the pieces of the crockpot but failed to do so asserting base of his first metatarsal and causing an avulsion of a long narrow strip of skin on the plantar aspect of his foot. He denies this injury being intentional. Patient denies additional lacerations, fever, muscle aches, or tremendous blood loss. Patient states that there was a significant amount of blood remaining on the floor following the injury.      Past Medical History:  Diagnosis Date  . Alzheimer disease   . Anxiety   . Arthritis   . Atrial fibrillation (HCC)    Chronic  . Dementia   . Depression   . Dysrhythmia    a fib  . ED (erectile dysfunction)   . HLD (hyperlipidemia)   . HTN (hypertension)   . Lipoma of skin   . Mixed Alzheimer's and vascular dementia   . Other and unspecified hyperlipidemia 03/20/2013  . Poor balance   . Sleep apnea    cpap  . Stroke ([place]) 1991  . Tremor     Patient Active Problem List   Diagnosis Date Noted  . Primary osteoarthritis of right hip 07/31/2016  . Weakness 02/10/2016  . Mixed Alzheimer's and vascular dementia 10/28/2013  . Parkinsonian features 03/31/2013  . History of stroke 03/23/2013  . Bipolar disorder ([place]) 03/20/2013  . Hyperlipidemia 03/20/2013  . Brain TIA 03/18/2013  . Gait instability 03/17/2013  . Chronic atrial fibrillation ([place]) 03/17/2013  .  Essential hypertension 03/17/2013    Past Surgical History:  Procedure Laterality Date  . TONSILLECTOMY AND ADENOIDECTOMY    . TOTAL HIP ARTHROPLASTY Right 07/31/2016   Procedure: RIGHT TOTAL HIP ARTHROPLASTY ANTERIOR APPROACH;  Surgeon: Rod Can, MD;  Location: [facility] ORS;  Service: Orthopedics;  Laterality: Right;  Requesting RNFA       Home Medications    Prior to Admission medications   Medication Sig Start Date End Date Taking? Authorizing Provider  atorvastatin (LIPITOR) 40 MG tablet Take 40 mg by mouth at bedtime.  11/27/15   [provider]  B Complex Vitamins (B COMPLEX PO) Take 1 tablet by mouth daily.    [provider]  carbidopa-levodopa (SINEMET IR) 25-250 MG per tablet Take 1 tablet by mouth 2 (two) times daily.     [provider]  divalproex (DEPAKOTE ER) 250 MG 24 hr tablet Take 1 tablet (250 mg total) by mouth daily. 02/12/16   Geradine Girt, DO  divalproex (DEPAKOTE ER) 500 MG 24 hr tablet Take 1 tablet (500 mg total) by mouth at bedtime. 02/12/16   Geradine Girt, DO  docusate sodium (COLACE) 100 MG capsule Take 1 capsule (100 mg total) by mouth 2 (two) times daily. 08/01/16   Swinteck, Aaron Edelman, MD  donepezil (ARICEPT) 10 MG tablet Take 1 tablet (10 mg total) by mouth at bedtime. 08/08/14  Tomi Likens, Adam R, DO  fluocinonide gel (LIDEX) 0.62 % Apply 1 application topically 2 (two) times daily as needed (ECZEMA).    [provider]  folic acid (FOLVITE) 1 MG tablet Take 1 mg by mouth at bedtime.     [provider]  gabapentin (NEURONTIN) 100 MG capsule Take 200 mg by mouth 2 (two) times daily. 02/02/16   [provider]  HYDROcodone-acetaminophen (NORCO/VICODIN) 5-325 MG tablet Take 1-2 tablets by mouth every 4 (four) hours as needed (breakthrough pain). 08/01/16   Swinteck, Aaron Edelman, MD  lamoTRIgine (LAMICTAL) 150 MG tablet Take 150 mg by mouth 2 (two) times daily.    [provider]  lisinopril (PRINIVIL,ZESTRIL)  10 MG tablet Take 10 mg by mouth at bedtime.     [provider]  memantine (NAMENDA) 10 MG tablet Take 10 mg by mouth at bedtime.    [provider]  metroNIDAZOLE (METROGEL) 0.75 % gel Apply 1 application topically 2 (two) times daily.    [provider]  Multiple Vitamin (MULTIVITAMIN WITH MINERALS) TABS tablet Take 1 tablet by mouth daily.    [provider]  Omega-3 Fatty Acids (FISH OIL) 1000 MG CAPS Take 1,000 mg by mouth daily.    [provider]  ondansetron (ZOFRAN) 4 MG tablet Take 1 tablet (4 mg total) by mouth every 6 (six) hours as needed for nausea. 08/01/16   Swinteck, Aaron Edelman, MD  rivaroxaban (XARELTO) 20 MG TABS tablet Take 20 mg by mouth at bedtime.    [provider]  senna (SENOKOT) 8.6 MG TABS tablet Take 2 tablets (17.2 mg total) by mouth at bedtime. 08/01/16   Rod Can, MD    Family History Family History  Problem Relation Age of Onset  . Suicidality Unknown   . Cancer Unknown   . Alcohol abuse Unknown   . Cancer Mother   . Suicidality Father     Social History Social History  Substance Use Topics  . Smoking status: Never Smoker  . Smokeless tobacco: Former Systems developer    Quit date: 07/23/1968  . Alcohol use 0.6 oz/week    1 Shots of liquor per week     Comment: occasional     Allergies   Latex   Review of Systems Review of Systems  Constitutional: Negative for chills and fever.  HENT: Negative for ear pain and sore throat.   Eyes: Negative for pain and visual disturbance.  Respiratory: Negative for cough and shortness of breath.   Cardiovascular: Negative for chest pain and palpitations.  Gastrointestinal: Negative for abdominal pain and vomiting.  Genitourinary: Negative for dysuria and hematuria.  Musculoskeletal: Negative for arthralgias and back pain.  Skin: Positive for wound (To the right foot, on the medial aspect of the first digit and in the arch of the foot.). Negative for color change and  rash.  Neurological: Negative for seizures and syncope.  All other systems reviewed and are negative.    Physical Exam Updated Vital Signs BP (!) 150/76 (BP Location: Right Arm)   Pulse (!) 58   Temp 98.3 F (36.8 C) (Oral)   Resp 18   Ht 5\' 10"  (1.778 m)   Wt 85.3 kg (188 lb)   SpO2 100%   BMI 26.98 kg/m   Physical Exam  Constitutional: He appears well-developed and well-nourished.  HENT:  Head: Normocephalic and atraumatic.  Eyes: Conjunctivae are normal.  Neck: Neck supple.  Cardiovascular: Normal rate and regular rhythm.   No murmur heard. Pulmonary/Chest: Effort  normal and breath sounds normal. No respiratory distress.  Abdominal: Soft. There is no tenderness.  Musculoskeletal: Normal range of motion. He exhibits no edema, tenderness or deformity.  Neurological: He is alert.  Skin: Skin is warm and dry. Capillary refill takes less than 2 seconds.  Two lacerations of the left foot. One presents on the medial aspect of the base of the first digit being 3cm in length and 2-3mm in depth. The second presents on the arch of the left foot being shallow, 7cm in length and 88mm in depth.   Psychiatric: He has a normal mood and affect. His behavior is normal.  Nursing note and vitals reviewed.    ED Treatments / Results  Labs (all labs ordered are listed, but only abnormal results are displayed) Labs Reviewed - No data to display  EKG  EKG Interpretation None       Radiology Dg Foot Complete Left  Result Date: 10/28/2016 CLINICAL DATA:  Laceration to the plantar surface of the left foot. EXAM: LEFT FOOT - COMPLETE 3+ VIEW COMPARISON:  None. FINDINGS: Negative for acute fracture or dislocation. No radiopaque foreign body. No soft tissue gas. IMPRESSION: No radiopaque foreign body.  No evidence of bony injury. Electronically Signed   By: Andreas Newport M.D.   On: 10/28/2016 04:35    Procedures .Marland KitchenLaceration Repair Date/Time: 10/28/2016 8:21 AM Performed by:  Kathi Ludwig Authorized by: Carmin Muskrat   Consent:    Consent obtained:  Verbal   Consent given by:  Patient   Risks discussed:  Infection and pain   Alternatives discussed:  No treatment Anesthesia (see MAR for exact dosages):    Anesthesia method:  Local infiltration   Local anesthetic:  Lidocaine 2% w/o epi Laceration details:    Location:  Foot   Foot location:  Sole of L foot   Length (cm):  3   Depth (mm):  3 Repair type:    Repair type:  Simple Pre-procedure details:    Preparation:  Patient was prepped and draped in usual sterile fashion Exploration:    Hemostasis achieved with:  Direct pressure   Wound extent: no nerve damage noted and no tendon damage noted     Contaminated: no   Treatment:    Area cleansed with:  Saline and Betadine   Amount of cleaning:  Extensive   Irrigation solution:  Sterile saline   Irrigation volume:  59ml   Irrigation method:  Pressure wash   Visualized foreign bodies/material removed: no   Skin repair:    Repair method:  Sutures   Suture size:  5-0   Suture material:  Nylon   Suture technique:  Simple interrupted   Number of sutures:  7 Approximation:    Approximation:  Close   Vermilion border: well-aligned   Post-procedure details:    Dressing:  Antibiotic ointment and sterile dressing   Patient tolerance of procedure:  Tolerated well, no immediate complications Comments:     The wound appeared clean and free of debris or contaminant. 3 mL of 2% lidocaine without epi was used to anesthetize the site. Patient tolerated the procedure well despite of his muscle fasciculations.     (including critical care time)  Medications Ordered in ED Medications  lidocaine (XYLOCAINE) 2 % (with pres) injection 200 mg (200 mg Intradermal Given 10/28/16 9702)     Initial Impression / Assessment and Plan / ED Course  I have reviewed the triage vital signs and the nursing notes.  Pertinent labs &  imaging results that were  available during my care of the patient were reviewed by me and considered in my medical decision making (see chart for details).    Given the patient's presentation of acute onset left foot pain laceration following contact with a sharp object he has an avulsion as well as a laceration the left foot. Plan is to suture the laceration extended and Steri-Strip the avulsion in place with loose pain imaging prior to discharge.  745 AM, lidocaine without epi 2% ordered. Wound cleaned with syringe with splash guard using sterile water. She tolerated cleaning well wound appears to be free contaminant and amenable to repair.  8:30 AM, awaiting lidocaine from pharmacy.  9:30 AM, patient tolerated procedure well as documented above. Patient discharged home with directions to follow up with his PCP or EGD in 8-10 days for suture removal and wound care. He was advised that if at anytime between now and suture removal he will develop fever, nausea, blood streaking, shortness of breath, but the concerning symptoms and she will return to the ED. Patient advised to take ibuprofen alternated with Tylenol every 4 hours as per manufacturers label. In addition patient given information on the care and maintenance of a sutured wound. Patient discharged to self-care in good condition.  Patient seen and evaluated with Dr. Vanita Panda.  Final Clinical Impressions(s) / ED Diagnoses   Final diagnoses:  Laceration of plantar aspect of left foot, initial encounter  Laceration of left foot, initial encounter    New Prescriptions Discharge Medication List as of 10/28/2016  9:43 AM       Kathi Ludwig, MD 10/28/16 [place]    Carmin Muskrat, MD 10/29/16 2002

## 2016-10-28 NOTE — Discharge Instructions (Signed)
Please follow-up with your primary care doctor for removal of your stiches and wound care/evaluation within 8-10 days. If they are unavailable, you may return to the emergency department.   If at any time between now and your appointment, should your wound become red, swollen, or contaminated, please contact medical personal or return to the ED. In addition, should you develop a fever, nausea, chills, muscle aches, streaking of blood from the foot or other concerning symptoms related to your foot please return to the emergency department.   Pain control can be achieved with ibuprofen and tylenol. You may alter between the two every 4 hours as needed at the does recommended on the label. Please do not exceed 800mg  ibuprofen three times daily for more than three days. Please do not walk on your foot until you are seen for suture removal.   We have included wound care directions for your convenience. If this information is not satisfactory please do not hesitate to call your PCP or the ED for additional wound care instructions.   Thank you for your visit to the [facility] ED.

## 2016-11-21 ENCOUNTER — Other Ambulatory Visit: Payer: Self-pay | Admitting: Internal Medicine

## 2016-11-21 DIAGNOSIS — R748 Abnormal levels of other serum enzymes: Secondary | ICD-10-CM

## 2016-11-21 DIAGNOSIS — R634 Abnormal weight loss: Secondary | ICD-10-CM

## 2017-03-18 DIAGNOSIS — R413 Other amnesia: Secondary | ICD-10-CM | POA: Insufficient documentation

## 2017-03-19 DIAGNOSIS — E538 Deficiency of other specified B group vitamins: Secondary | ICD-10-CM | POA: Insufficient documentation

## 2017-04-21 ENCOUNTER — Encounter: Payer: Self-pay | Admitting: Cardiovascular Disease

## 2017-04-21 ENCOUNTER — Ambulatory Visit: Payer: Medicare Other | Admitting: Cardiovascular Disease

## 2017-04-21 DIAGNOSIS — I10 Essential (primary) hypertension: Secondary | ICD-10-CM

## 2017-04-21 DIAGNOSIS — I482 Chronic atrial fibrillation, unspecified: Secondary | ICD-10-CM

## 2017-04-21 NOTE — Assessment & Plan Note (Signed)
History of chronic atrial fibrillation rate controlled on Xarelto oral anticoagulation. 

## 2017-04-21 NOTE — Progress Notes (Signed)
04/21/2017 [name] [name]   [birth year]  [id number]  Primary Physician Deland Pretty, MD Primary Cardiologist: Lorretta Harp MD [name], [name]  HPI:  [name] [name] is a 72 y.o.  married Caucasian male father of 86, grandfather and one grandchild is accompanied by his wife [name] who is also a patient of mine.  I last saw him in the office 04/11/16 He has a history of hypertension and hyperlipidemia as well as chronic atrial fibrillation. I initially saw him back in 1991 when he had a stroke and placed him on Coumadin. He was recently transitioned to Xarelto. He has a history of dementia over the last several years as well as bipolar disorder. He has never had a heart attack. He denies chest pain or shortness of breath. He had an uncomplicated total hip replacement recently and now scheduled for a left total knee replacement which I'm going to clear him at low risk for.     Current Meds  Medication Sig  . atorvastatin (LIPITOR) 40 MG tablet Take 40 mg by mouth at bedtime.   . B Complex Vitamins (B COMPLEX PO) Take 1 tablet by mouth daily.  . carbidopa-levodopa (SINEMET IR) 25-250 MG per tablet Take 1 tablet by mouth 2 (two) times daily.   . divalproex (DEPAKOTE ER) 500 MG 24 hr tablet Take 1 tablet (500 mg total) by mouth at bedtime.  . donepezil (ARICEPT) 10 MG tablet Take 1 tablet (10 mg total) by mouth at bedtime.  . Evolocumab (REPATHA [place]) Inject into the skin every 14 (fourteen) days.  . fluocinonide gel (LIDEX) 3.14 % Apply 1 application topically 2 (two) times daily as needed (ECZEMA).  . folic acid (FOLVITE) 1 MG tablet Take 1 mg by mouth at bedtime.   . gabapentin (NEURONTIN) 100 MG capsule Take 200 mg by mouth 2 (two) times daily.  Marland Kitchen lamoTRIgine (LAMICTAL) 150 MG tablet Take 150 mg by mouth 2 (two) times daily.  Marland Kitchen lisinopril (PRINIVIL,ZESTRIL) 10 MG tablet Take 10 mg by mouth at bedtime.   . memantine (NAMENDA) 10 MG tablet Take 10 mg by mouth at bedtime.    . metroNIDAZOLE (METROGEL) 0.75 % gel Apply 1 application topically 2 (two) times daily.  . rivaroxaban (XARELTO) 20 MG TABS tablet Take 20 mg by mouth at bedtime.     Allergies  Allergen Reactions  . Latex Rash    Reaction to knee wraps    Social History   Socioeconomic History  . Marital status: Married    Spouse name: Not on file  . Number of children: 3  . Years of education: Not on file  . Highest education level: Not on file  Social Needs  . Financial resource strain: Not on file  . Food insecurity - worry: Not on file  . Food insecurity - inability: Not on file  . Transportation needs - medical: Not on file  . Transportation needs - non-medical: Not on file  Occupational History  . Occupation: retired  Tobacco Use  . Smoking status: Never Smoker  . Smokeless tobacco: Former Network engineer and Sexual Activity  . Alcohol use: Yes    Alcohol/week: 0.6 oz    Types: 1 Shots of liquor per week    Comment: occasional  . Drug use: No  . Sexual activity: Yes  Other Topics Concern  . Not on file  Social History Narrative  . Not on file     Review of Systems: General:  negative for chills, fever, night sweats or weight changes.  Cardiovascular: negative for chest pain, dyspnea on exertion, edema, orthopnea, palpitations, paroxysmal nocturnal dyspnea or shortness of breath Dermatological: negative for rash Respiratory: negative for cough or wheezing Urologic: negative for hematuria Abdominal: negative for nausea, vomiting, diarrhea, bright red blood per rectum, melena, or hematemesis Neurologic: negative for visual changes, syncope, or dizziness All other systems reviewed and are otherwise negative except as noted above.    Blood pressure (!) 152/90, pulse 62, height 5\' 11"  (1.803 m), weight 182 lb 3.2 oz (82.6 kg).  General appearance: alert and no distress Neck: no adenopathy, no carotid bruit, no JVD, supple, symmetrical, trachea midline and thyroid not  enlarged, symmetric, no tenderness/mass/nodules Lungs: clear to auscultation bilaterally Heart: regular rate and rhythm, S1, S2 normal, no murmur, click, rub or gallop Extremities: extremities normal, atraumatic, no cyanosis or edema Pulses: 2+ and symmetric Skin: Skin color, texture, turgor normal. No rashes or lesions Neurologic: Alert and oriented X 3, normal strength and tone. Normal symmetric reflexes. Normal coordination and gait  EKG atrial fibrillation with a ventricular response of 62 and nonspecific ST and T-wave changes. I personally reviewed this EKG.  ASSESSMENT AND PLAN:   Hyperlipidemia History of hyperlipidemia on Repatha and atorvastatin followed by his PCP  Essential hypertension History of essential hypertension blood pressure measured 152/90. He is on lisinopril. Continue current meds at current dosing.  Chronic atrial fibrillation (HCC) History of chronic atrial fibrillation rate controlled on Xarelto  oral anticoagulation.      Lorretta Harp MD FACP,FACC,FAHA, [facility] 04/21/2017 4:11 PM

## 2017-04-21 NOTE — Patient Instructions (Signed)
Medication Instructions: Your physician recommends that you continue on your current medications as directed. Please refer to the Current Medication list given to you today.  HOLD Xarelto for 3 days prior to hip sugery.  Follow-Up: Your physician wants you to follow-up in: 1 year with Dr. Gwenlyn Found. You will receive a reminder letter in the mail two months in advance. If you don't receive a letter, please call our office to schedule the follow-up appointment.  If you need a refill on your cardiac medications before your next appointment, please call your pharmacy.

## 2017-04-21 NOTE — Assessment & Plan Note (Signed)
History of essential hypertension blood pressure measured 152/90. He is on lisinopril. Continue current meds at current dosing.

## 2017-04-21 NOTE — Assessment & Plan Note (Signed)
History of hyperlipidemia on Repatha and atorvastatin followed by his PCP

## 2017-04-29 ENCOUNTER — Telehealth: Payer: Self-pay | Admitting: Cardiovascular Disease

## 2017-04-29 ENCOUNTER — Encounter (HOSPITAL_COMMUNITY): Payer: Self-pay | Admitting: *Deleted

## 2017-04-29 NOTE — Telephone Encounter (Signed)
Received duplicate clearance request on this patient who was seen by MD on 04/21/17 and cleared this day via letter sent to surgeon. Letter did not address xarelto, however MD note from this date addresses clearance AND xarelto. Routed letter to Dr. Lyla Glassing via [employer] fax @ [phone]

## 2017-05-04 ENCOUNTER — Ambulatory Visit: Payer: Self-pay | Admitting: Orthopedic Surgery

## 2017-05-05 ENCOUNTER — Ambulatory Visit: Payer: Self-pay | Admitting: Orthopedic Surgery

## 2017-05-05 NOTE — H&P (Signed)
TOTAL KNEE ADMISSION H&P  Patient is being admitted for left total knee arthroplasty.  Subjective:  Chief Complaint:left knee pain.  HPI: [name] [name], 72 y.o. male, has a history of pain and functional disability in the left knee due to arthritis and has failed non-surgical conservative treatments for greater than 12 weeks to includeNSAID's and/or analgesics, corticosteriod injections, viscosupplementation injections, flexibility and strengthening excercises, use of assistive devices, weight reduction as appropriate and activity modification.  Onset of symptoms was gradual, starting >10 years ago with rapidlly worsening course since that time. The patient noted no past surgery on the left knee(s).  Patient currently rates pain in the left knee(s) at 10 out of 10 with activity. Patient has night pain, worsening of pain with activity and weight bearing, pain that interferes with activities of daily living, pain with passive range of motion, crepitus and joint swelling.  Patient has evidence of subchondral cysts, subchondral sclerosis, periarticular osteophytes and joint space narrowing by imaging studies. There is no active infection.  Patient Active Problem List   Diagnosis Date Noted  . Primary osteoarthritis of right hip 07/31/2016  . Weakness 02/10/2016  . Mixed Alzheimer's and vascular dementia 10/28/2013  . Parkinsonian features 03/31/2013  . History of stroke 03/23/2013  . Bipolar disorder ([place]) 03/20/2013  . Hyperlipidemia 03/20/2013  . Brain TIA 03/18/2013  . Gait instability 03/17/2013  . Chronic atrial fibrillation ([place]) 03/17/2013  . Essential hypertension 03/17/2013   Past Medical History:  Diagnosis Date  . Alzheimer disease   . Anxiety   . Arthritis   . Atrial fibrillation (HCC)    Chronic  . Dementia   . Depression   . Dysrhythmia    a fib  . ED (erectile dysfunction)   . HLD (hyperlipidemia)   . HTN (hypertension)   . Lipoma of skin   . Mixed Alzheimer's and  vascular dementia   . Other and unspecified hyperlipidemia 03/20/2013  . Poor balance   . Sleep apnea    cpap  . Stroke ([place]) 1991  . Tremor     Past Surgical History:  Procedure Laterality Date  . TONSILLECTOMY AND ADENOIDECTOMY    . TOTAL HIP ARTHROPLASTY Right 07/31/2016   Procedure: RIGHT TOTAL HIP ARTHROPLASTY ANTERIOR APPROACH;  Surgeon: Rod Can, MD;  Location: [facility];  Service: Orthopedics;  Laterality: Right;  Requesting RNFA    Current Outpatient Medications  Medication Sig Dispense Refill Last Dose  . atorvastatin (LIPITOR) 40 MG tablet Take 40 mg by mouth at bedtime.    Taking  . B Complex Vitamins (B COMPLEX PO) Take 1 tablet by mouth daily.   Taking  . carbidopa-levodopa (SINEMET IR) 25-250 MG per tablet Take 1 tablet by mouth 2 (two) times daily.    Taking  . divalproex (DEPAKOTE ER) 500 MG 24 hr tablet Take 1 tablet (500 mg total) by mouth at bedtime.   Taking  . donepezil (ARICEPT) 10 MG tablet Take 1 tablet (10 mg total) by mouth at bedtime. 90 tablet 2 Taking  . Evolocumab (REPATHA [place]) Inject into the skin every 14 (fourteen) days.   Taking  . fluocinonide gel (LIDEX) 2.84 % Apply 1 application topically 2 (two) times daily as needed (ECZEMA).   Taking  . folic acid (FOLVITE) 1 MG tablet Take 1 mg by mouth at bedtime.    Taking  . gabapentin (NEURONTIN) 100 MG capsule Take 200 mg by mouth 2 (two) times daily.   Taking  . lamoTRIgine (LAMICTAL) 150 MG tablet  Take 150 mg by mouth 2 (two) times daily.   Taking  . lisinopril (PRINIVIL,ZESTRIL) 10 MG tablet Take 10 mg by mouth at bedtime.    Taking  . memantine (NAMENDA) 10 MG tablet Take 10 mg by mouth at bedtime.   Taking  . metroNIDAZOLE (METROGEL) 0.75 % gel Apply 1 application topically 2 (two) times daily.   Taking  . rivaroxaban (XARELTO) 20 MG TABS tablet Take 20 mg by mouth at bedtime.   Taking   No current facility-administered medications for this visit.    Allergies  Allergen Reactions  . Latex Rash     Reaction to knee wraps    Social History   Tobacco Use  . Smoking status: Never Smoker  . Smokeless tobacco: Former Network engineer Use Topics  . Alcohol use: Yes    Alcohol/week: 0.6 oz    Types: 1 Shots of liquor per week    Comment: occasional    Family History  Problem Relation Age of Onset  . Suicidality Unknown   . Cancer Unknown   . Alcohol abuse Unknown   . Cancer Mother   . Suicidality Father      Review of Systems  Eyes: Positive for double vision.  Respiratory: Negative.   Cardiovascular: Negative.   Gastrointestinal: Negative.   Genitourinary: Negative.   Musculoskeletal: Positive for back pain and joint pain.  Neurological: Negative.   Endo/Heme/Allergies: Negative.   Psychiatric/Behavioral: Positive for memory loss.    Objective:  Physical Exam  Vitals reviewed. Constitutional: He is oriented to person, place, and time. He appears well-developed and well-nourished.  HENT:  Head: Normocephalic and atraumatic.  Eyes: Conjunctivae and EOM are normal. Pupils are equal, round, and reactive to light.  Neck: Normal range of motion. Neck supple.  Cardiovascular: Normal rate and intact distal pulses.  Respiratory: Effort normal. No respiratory distress.  GI: Soft. He exhibits no distension.  Genitourinary:  Genitourinary Comments: deferred  Musculoskeletal:       Left knee: He exhibits decreased range of motion, swelling, effusion, deformity and abnormal alignment. Tenderness found. Medial joint line and lateral joint line tenderness noted.  Neurological: He is alert and oriented to person, place, and time. He has normal reflexes.  Skin: Skin is warm and dry.  Psychiatric: He has a normal mood and affect. His behavior is normal. Judgment and thought content normal.    Vital signs in last 24 hours: @VSRANGES @  Labs:   Estimated body mass index is 25.41 kg/m as calculated from the following:   Height as of 04/21/17: 5\' 11"  (1.803 m).   Weight as  of 04/21/17: 82.6 kg (182 lb 3.2 oz).   Imaging Review Plain radiographs demonstrate severe degenerative joint disease of the left knee(s). The overall alignment issignificant varus. The bone quality appears to be adequate for age and reported activity level.  Assessment/Plan:  End stage arthritis, left knee   The patient history, physical examination, clinical judgment of the provider and imaging studies are consistent with end stage degenerative joint disease of the left knee(s) and total knee arthroplasty is deemed medically necessary. The treatment options including medical management, injection therapy arthroscopy and arthroplasty were discussed at length. The risks and benefits of total knee arthroplasty were presented and reviewed. The risks due to aseptic loosening, infection, stiffness, patella tracking problems, thromboembolic complications and other imponderables were discussed. The patient acknowledged the explanation, agreed to proceed with the plan and consent was signed. Patient is being admitted for inpatient treatment for  surgery, pain control, PT, OT, prophylactic antibiotics, VTE prophylaxis, progressive ambulation and ADL's and discharge planning. The patient is planning to be discharged home with outpatient PT at [facility].

## 2017-05-05 NOTE — H&P (View-Only) (Signed)
TOTAL KNEE ADMISSION H&P  Patient is being admitted for left total knee arthroplasty.  Subjective:  Chief Complaint:left knee pain.  HPI: [name] [name], 72 y.o. male, has a history of pain and functional disability in the left knee due to arthritis and has failed non-surgical conservative treatments for greater than 12 weeks to includeNSAID's and/or analgesics, corticosteriod injections, viscosupplementation injections, flexibility and strengthening excercises, use of assistive devices, weight reduction as appropriate and activity modification.  Onset of symptoms was gradual, starting >10 years ago with rapidlly worsening course since that time. The patient noted no past surgery on the left knee(s).  Patient currently rates pain in the left knee(s) at 10 out of 10 with activity. Patient has night pain, worsening of pain with activity and weight bearing, pain that interferes with activities of daily living, pain with passive range of motion, crepitus and joint swelling.  Patient has evidence of subchondral cysts, subchondral sclerosis, periarticular osteophytes and joint space narrowing by imaging studies. There is no active infection.  Patient Active Problem List   Diagnosis Date Noted  . Primary osteoarthritis of right hip 07/31/2016  . Weakness 02/10/2016  . Mixed Alzheimer's and vascular dementia 10/28/2013  . Parkinsonian features 03/31/2013  . History of stroke 03/23/2013  . Bipolar disorder ([place]) 03/20/2013  . Hyperlipidemia 03/20/2013  . Brain TIA 03/18/2013  . Gait instability 03/17/2013  . Chronic atrial fibrillation ([place]) 03/17/2013  . Essential hypertension 03/17/2013   Past Medical History:  Diagnosis Date  . Alzheimer disease   . Anxiety   . Arthritis   . Atrial fibrillation (HCC)    Chronic  . Dementia   . Depression   . Dysrhythmia    a fib  . ED (erectile dysfunction)   . HLD (hyperlipidemia)   . HTN (hypertension)   . Lipoma of skin   . Mixed Alzheimer's and  vascular dementia   . Other and unspecified hyperlipidemia 03/20/2013  . Poor balance   . Sleep apnea    cpap  . Stroke ([place]) 1991  . Tremor     Past Surgical History:  Procedure Laterality Date  . TONSILLECTOMY AND ADENOIDECTOMY    . TOTAL HIP ARTHROPLASTY Right 07/31/2016   Procedure: RIGHT TOTAL HIP ARTHROPLASTY ANTERIOR APPROACH;  Surgeon: Rod Can, MD;  Location: [facility];  Service: Orthopedics;  Laterality: Right;  Requesting RNFA    Current Outpatient Medications  Medication Sig Dispense Refill Last Dose  . atorvastatin (LIPITOR) 40 MG tablet Take 40 mg by mouth at bedtime.    Taking  . B Complex Vitamins (B COMPLEX PO) Take 1 tablet by mouth daily.   Taking  . carbidopa-levodopa (SINEMET IR) 25-250 MG per tablet Take 1 tablet by mouth 2 (two) times daily.    Taking  . divalproex (DEPAKOTE ER) 500 MG 24 hr tablet Take 1 tablet (500 mg total) by mouth at bedtime.   Taking  . donepezil (ARICEPT) 10 MG tablet Take 1 tablet (10 mg total) by mouth at bedtime. 90 tablet 2 Taking  . Evolocumab (REPATHA [place]) Inject into the skin every 14 (fourteen) days.   Taking  . fluocinonide gel (LIDEX) 7.09 % Apply 1 application topically 2 (two) times daily as needed (ECZEMA).   Taking  . folic acid (FOLVITE) 1 MG tablet Take 1 mg by mouth at bedtime.    Taking  . gabapentin (NEURONTIN) 100 MG capsule Take 200 mg by mouth 2 (two) times daily.   Taking  . lamoTRIgine (LAMICTAL) 150 MG tablet  Take 150 mg by mouth 2 (two) times daily.   Taking  . lisinopril (PRINIVIL,ZESTRIL) 10 MG tablet Take 10 mg by mouth at bedtime.    Taking  . memantine (NAMENDA) 10 MG tablet Take 10 mg by mouth at bedtime.   Taking  . metroNIDAZOLE (METROGEL) 0.75 % gel Apply 1 application topically 2 (two) times daily.   Taking  . rivaroxaban (XARELTO) 20 MG TABS tablet Take 20 mg by mouth at bedtime.   Taking   No current facility-administered medications for this visit.    Allergies  Allergen Reactions  . Latex Rash     Reaction to knee wraps    Social History   Tobacco Use  . Smoking status: Never Smoker  . Smokeless tobacco: Former Network engineer Use Topics  . Alcohol use: Yes    Alcohol/week: 0.6 oz    Types: 1 Shots of liquor per week    Comment: occasional    Family History  Problem Relation Age of Onset  . Suicidality Unknown   . Cancer Unknown   . Alcohol abuse Unknown   . Cancer Mother   . Suicidality Father      Review of Systems  Eyes: Positive for double vision.  Respiratory: Negative.   Cardiovascular: Negative.   Gastrointestinal: Negative.   Genitourinary: Negative.   Musculoskeletal: Positive for back pain and joint pain.  Neurological: Negative.   Endo/Heme/Allergies: Negative.   Psychiatric/Behavioral: Positive for memory loss.    Objective:  Physical Exam  Vitals reviewed. Constitutional: He is oriented to person, place, and time. He appears well-developed and well-nourished.  HENT:  Head: Normocephalic and atraumatic.  Eyes: Conjunctivae and EOM are normal. Pupils are equal, round, and reactive to light.  Neck: Normal range of motion. Neck supple.  Cardiovascular: Normal rate and intact distal pulses.  Respiratory: Effort normal. No respiratory distress.  GI: Soft. He exhibits no distension.  Genitourinary:  Genitourinary Comments: deferred  Musculoskeletal:       Left knee: He exhibits decreased range of motion, swelling, effusion, deformity and abnormal alignment. Tenderness found. Medial joint line and lateral joint line tenderness noted.  Neurological: He is alert and oriented to person, place, and time. He has normal reflexes.  Skin: Skin is warm and dry.  Psychiatric: He has a normal mood and affect. His behavior is normal. Judgment and thought content normal.    Vital signs in last 24 hours: @VSRANGES @  Labs:   Estimated body mass index is 25.41 kg/m as calculated from the following:   Height as of 04/21/17: 5\' 11"  (1.803 m).   Weight as  of 04/21/17: 82.6 kg (182 lb 3.2 oz).   Imaging Review Plain radiographs demonstrate severe degenerative joint disease of the left knee(s). The overall alignment issignificant varus. The bone quality appears to be adequate for age and reported activity level.  Assessment/Plan:  End stage arthritis, left knee   The patient history, physical examination, clinical judgment of the provider and imaging studies are consistent with end stage degenerative joint disease of the left knee(s) and total knee arthroplasty is deemed medically necessary. The treatment options including medical management, injection therapy arthroscopy and arthroplasty were discussed at length. The risks and benefits of total knee arthroplasty were presented and reviewed. The risks due to aseptic loosening, infection, stiffness, patella tracking problems, thromboembolic complications and other imponderables were discussed. The patient acknowledged the explanation, agreed to proceed with the plan and consent was signed. Patient is being admitted for inpatient treatment for  surgery, pain control, PT, OT, prophylactic antibiotics, VTE prophylaxis, progressive ambulation and ADL's and discharge planning. The patient is planning to be discharged home with outpatient PT at [facility].

## 2017-05-18 ENCOUNTER — Encounter (HOSPITAL_COMMUNITY): Payer: Self-pay

## 2017-05-18 NOTE — Pre-Procedure Instructions (Signed)
The following are in the chart: Cardiac clearance/last office visit note Dr. Gwenlyn Found 04/21/2017 Surgical Clearance/last office visit note Dr. Shelia Media 04/16/2017 Hgb A1C (6.0) 04/16/2017  EKG 04/21/17 in epic.

## 2017-05-18 NOTE — Patient Instructions (Addendum)
Your procedure is scheduled on: Thursday, May 21, 2017   Surgery Time:  10:00AM-12:30PM   Report to [facility] Main  Entrance    Report to admitting at 7:30 AM   Call this number if you have problems the morning of surgery [phone]   Bring CPAP mask and tubing day of surgery   Do not eat food or drink liquids :After Midnight.   Do NOT smoke after Midnight   Take these medicines the morning of surgery with A SIP OF WATER:Depakote, Gabapentin, Lamotrigine              You may not have any metal on your body including  jewelry, and body piercings             Do not wear lotions, powders, perfumes/cologne, or deodorant             Do not wear nail polish.  Do not shave  48 hours prior to surgery.                Do not bring valuables to the hospital. [place].   Contacts, dentures or bridgework may not be worn into surgery.   Leave suitcase in the car. After surgery it may be brought to your room.   Special Instructions: Bring a copy of your healthcare power of attorney and living will documents         the day of surgery if you haven't scanned them in before.              Please read over the following fact sheets you were given:  [facility] - Preparing for Surgery Before surgery, you can play an important role.  Because skin is not sterile, your skin needs to be as free of germs as possible.  You can reduce the number of germs on your skin by washing with CHG (chlorahexidine gluconate) soap before surgery.  CHG is an antiseptic cleaner which kills germs and bonds with the skin to continue killing germs even after washing. Please DO NOT use if you have an allergy to CHG or antibacterial soaps.  If your skin becomes reddened/irritated stop using the CHG and inform your nurse when you arrive at Short Stay. Do not shave (including legs and underarms) for at least 48 hours prior to the first CHG shower.  You may  shave your face/neck.  Please follow these instructions carefully:  1.  Shower with CHG Soap the night before surgery and the  morning of surgery.  2.  If you choose to wash your hair, wash your hair first as usual with your normal  shampoo.  3.  After you shampoo, rinse your hair and body thoroughly to remove the shampoo.                             4.  Use CHG as you would any other liquid soap.  You can apply chg directly to the skin and wash.  Gently with a scrungie or clean washcloth.  5.  Apply the CHG Soap to your body ONLY FROM THE NECK DOWN.   Do   not use on face/ open  Wound or open sores. Avoid contact with eyes, ears mouth and   genitals (private parts).                       Wash face,  Genitals (private parts) with your normal soap.             6.  Wash thoroughly, paying special attention to the area where your    surgery  will be performed.  7.  Thoroughly rinse your body with warm water from the neck down.  8.  DO NOT shower/wash with your normal soap after using and rinsing off the CHG Soap.                9.  Pat yourself dry with a clean towel.            10.  Wear clean pajamas.            11.  Place clean sheets on your bed the night of your first shower and do not  sleep with pets. Day of Surgery : Do not apply any lotions/deodorants the morning of surgery.  Please wear clean clothes to the hospital/surgery center.  FAILURE TO FOLLOW THESE INSTRUCTIONS MAY RESULT IN THE CANCELLATION OF YOUR SURGERY  PATIENT SIGNATURE_________________________________  NURSE SIGNATURE__________________________________  ________________________________________________________________________   [name] [name]  An incentive spirometer is a tool that can help keep your lungs clear and active. This tool measures how well you are filling your lungs with each breath. Taking long deep breaths may help reverse or decrease the chance of developing breathing  (pulmonary) problems (especially infection) following:  A long period of time when you are unable to move or be active. BEFORE THE PROCEDURE   If the spirometer includes an indicator to show your best effort, your nurse or respiratory therapist will set it to a desired goal.  If possible, sit up straight or lean slightly forward. Try not to slouch.  Hold the incentive spirometer in an upright position. INSTRUCTIONS FOR USE  1. Sit on the edge of your bed if possible, or sit up as far as you can in bed or on a chair. 2. Hold the incentive spirometer in an upright position. 3. Breathe out normally. 4. Place the mouthpiece in your mouth and seal your lips tightly around it. 5. Breathe in slowly and as deeply as possible, raising the piston or the ball toward the top of the column. 6. Hold your breath for 3-5 seconds or for as long as possible. Allow the piston or ball to fall to the bottom of the column. 7. Remove the mouthpiece from your mouth and breathe out normally. 8. Rest for a few seconds and repeat Steps 1 through 7 at least 10 times every 1-2 hours when you are awake. Take your time and take a few normal breaths between deep breaths. 9. The spirometer may include an indicator to show your best effort. Use the indicator as a goal to work toward during each repetition. 10. After each set of 10 deep breaths, practice coughing to be sure your lungs are clear. If you have an incision (the cut made at the time of surgery), support your incision when coughing by placing a pillow or rolled up towels firmly against it. Once you are able to get out of bed, walk around indoors and cough well. You may stop using the incentive spirometer when instructed by your caregiver.  RISKS AND COMPLICATIONS  Take your time  so you do not get dizzy or light-headed.  If you are in pain, you may need to take or ask for pain medication before doing incentive spirometry. It is harder to take a deep breath if you  are having pain. AFTER USE  Rest and breathe slowly and easily.  It can be helpful to keep track of a log of your progress. Your caregiver can provide you with a simple table to help with this. If you are using the spirometer at home, follow these instructions: [place] IF:   You are having difficultly using the spirometer.  You have trouble using the spirometer as often as instructed.  Your pain medication is not giving enough relief while using the spirometer.  You develop fever of 100.5 F (38.1 C) or higher. SEEK IMMEDIATE MEDICAL CARE IF:   You cough up bloody sputum that had not been present before.  You develop fever of 102 F (38.9 C) or greater.  You develop worsening pain at or near the incision site. MAKE SURE YOU:   Understand these instructions.  Will watch your condition.  Will get help right away if you are not doing well or get worse. Document Released: 06/30/2006 Document Revised: 05/12/2011 Document Reviewed: 08/31/2006 ExitCare Patient Information 2014 Exit[employer], [place].   ________________________________________________________________________  WHAT IS A BLOOD TRANSFUSION? Blood Transfusion Information  A transfusion is the replacement of blood or some of its parts. Blood is made up of multiple cells which provide different functions.  Red blood cells carry oxygen and are used for blood loss replacement.  White blood cells fight against infection.  Platelets control bleeding.  Plasma helps clot blood.  Other blood products are available for specialized needs, such as hemophilia or other clotting disorders. BEFORE THE TRANSFUSION  Who gives blood for transfusions?   Healthy volunteers who are fully evaluated to make sure their blood is safe. This is blood bank blood. Transfusion therapy is the safest it has ever been in the practice of medicine. Before blood is taken from a donor, a complete history is taken to make sure that person has  no history of diseases nor engages in risky social behavior (examples are intravenous drug use or sexual activity with multiple partners). The donor's travel history is screened to minimize risk of transmitting infections, such as malaria. The donated blood is tested for signs of infectious diseases, such as HIV and hepatitis. The blood is then tested to be sure it is compatible with you in order to minimize the chance of a transfusion reaction. If you or a relative donates blood, this is often done in anticipation of surgery and is not appropriate for emergency situations. It takes many days to process the donated blood. RISKS AND COMPLICATIONS Although transfusion therapy is very safe and saves many lives, the main dangers of transfusion include:   Getting an infectious disease.  Developing a transfusion reaction. This is an allergic reaction to something in the blood you were given. Every precaution is taken to prevent this. The decision to have a blood transfusion has been considered carefully by your caregiver before blood is given. Blood is not given unless the benefits outweigh the risks. AFTER THE TRANSFUSION  Right after receiving a blood transfusion, you will usually feel much better and more energetic. This is especially true if your red blood cells have gotten low (anemic). The transfusion raises the level of the red blood cells which carry oxygen, and this usually causes an energy increase.  The  nurse administering the transfusion will monitor you carefully for complications. HOME CARE INSTRUCTIONS  No special instructions are needed after a transfusion. You may find your energy is better. Speak with your caregiver about any limitations on activity for underlying diseases you may have. SEEK MEDICAL CARE IF:   Your condition is not improving after your transfusion.  You develop redness or irritation at the intravenous (IV) site. SEEK IMMEDIATE MEDICAL CARE IF:  Any of the following  symptoms occur over the next 12 hours:  Shaking chills.  You have a temperature by mouth above 102 F (38.9 C), not controlled by medicine.  Chest, back, or muscle pain.  People around you feel you are not acting correctly or are confused.  Shortness of breath or difficulty breathing.  Dizziness and fainting.  You get a rash or develop hives.  You have a decrease in urine output.  Your urine turns a dark color or changes to pink, red, or brown. Any of the following symptoms occur over the next 10 days:  You have a temperature by mouth above 102 F (38.9 C), not controlled by medicine.  Shortness of breath.  Weakness after normal activity.  The white part of the eye turns yellow (jaundice).  You have a decrease in the amount of urine or are urinating less often.  Your urine turns a dark color or changes to pink, red, or brown. Document Released: 02/15/2000 Document Revised: 05/12/2011 Document Reviewed: 10/04/2007 [facility] Patient Information 2014 [place], [place].  _______________________________________________________________________

## 2017-05-19 ENCOUNTER — Encounter (HOSPITAL_COMMUNITY): Payer: Self-pay

## 2017-05-19 ENCOUNTER — Encounter (HOSPITAL_COMMUNITY)
Admission: RE | Admit: 2017-05-19 | Discharge: 2017-05-19 | Disposition: A | Discharge status: Home / Self Care | Payer: Medicare Other | Source: Ambulatory Visit | Attending: Orthopedic Surgery | Admitting: Orthopedic Surgery

## 2017-05-19 ENCOUNTER — Other Ambulatory Visit: Payer: Self-pay

## 2017-05-19 DIAGNOSIS — Z5309 Procedure and treatment not carried out because of other contraindication: Secondary | ICD-10-CM | POA: Diagnosis not present

## 2017-05-19 DIAGNOSIS — R21 Rash and other nonspecific skin eruption: Secondary | ICD-10-CM | POA: Diagnosis not present

## 2017-05-19 DIAGNOSIS — M1712 Unilateral primary osteoarthritis, left knee: Secondary | ICD-10-CM | POA: Diagnosis present

## 2017-05-19 HISTORY — DX: Bipolar disorder, unspecified: F31.9

## 2017-05-19 HISTORY — DX: Other fatigue: R53.83

## 2017-05-19 HISTORY — DX: Other amnesia: R41.3

## 2017-05-19 HISTORY — DX: Residual hemorrhoidal skin tags: K64.4

## 2017-05-19 HISTORY — DX: Other chronic pain: G89.29

## 2017-05-19 HISTORY — DX: Restless legs syndrome: G25.81

## 2017-05-19 HISTORY — DX: Dizziness and giddiness: R42

## 2017-05-19 HISTORY — DX: Low back pain: M54.5

## 2017-05-19 HISTORY — DX: Low back pain, unspecified: M54.50

## 2017-05-19 HISTORY — DX: Testicular hypofunction: E29.1

## 2017-05-19 LAB — CBC
HCT: 38.9 % — ABNORMAL LOW (ref 39.0–52.0)
HEMOGLOBIN: 12.5 g/dL — AB (ref 13.0–17.0)
MCH: 30.6 pg (ref 26.0–34.0)
MCHC: 32.1 g/dL (ref 30.0–36.0)
MCV: 95.1 fL (ref 78.0–100.0)
Platelets: 154 10*3/uL (ref 150–400)
RBC: 4.09 MIL/uL — AB (ref 4.22–5.81)
RDW: 12.8 % (ref 11.5–15.5)
WBC: 4.9 10*3/uL (ref 4.0–10.5)

## 2017-05-19 LAB — BASIC METABOLIC PANEL
ANION GAP: 6 (ref 5–15)
BUN: 15 mg/dL (ref 6–20)
CALCIUM: 9.5 mg/dL (ref 8.9–10.3)
CO2: 27 mmol/L (ref 22–32)
Chloride: 103 mmol/L (ref 101–111)
Creatinine, Ser: 0.95 mg/dL (ref 0.61–1.24)
GLUCOSE: 98 mg/dL (ref 65–99)
Potassium: 4.7 mmol/L (ref 3.5–5.1)
Sodium: 136 mmol/L (ref 135–145)

## 2017-05-19 LAB — SURGICAL PCR SCREEN
MRSA, PCR: NEGATIVE
STAPHYLOCOCCUS AUREUS: POSITIVE — AB

## 2017-05-19 NOTE — Pre-Procedure Instructions (Signed)
CBC results 05/19/17 faxed to Dr. Lyla Glassing via epic.

## 2017-05-20 ENCOUNTER — Encounter (HOSPITAL_COMMUNITY): Payer: Self-pay | Admitting: *Deleted

## 2017-05-20 MED ORDER — TRANEXAMIC ACID 1000 MG/10ML IV SOLN
1000.0000 mg | INTRAVENOUS | Status: DC
  Filled 2017-05-20: qty 10

## 2017-05-20 NOTE — Pre-Procedure Instructions (Signed)
PCR results 05/19/2017 faxed to Dr. Lyla Glassing via epic.

## 2017-05-21 ENCOUNTER — Encounter (HOSPITAL_COMMUNITY)
Admission: RE | Disposition: A | Discharge status: Home / Self Care | Payer: Self-pay | Source: Ambulatory Visit | Attending: Orthopedic Surgery

## 2017-05-21 ENCOUNTER — Inpatient Hospital Stay (HOSPITAL_COMMUNITY): Payer: Medicare Other | Admitting: Registered Nurse

## 2017-05-21 ENCOUNTER — Ambulatory Visit (HOSPITAL_COMMUNITY)
Admission: RE | Admit: 2017-05-21 | Discharge: 2017-05-21 | Disposition: A | Discharge status: Home / Self Care | Payer: Medicare Other | Source: Ambulatory Visit | Attending: Orthopedic Surgery | Admitting: Orthopedic Surgery

## 2017-05-21 ENCOUNTER — Encounter (HOSPITAL_COMMUNITY): Payer: Self-pay | Admitting: *Deleted

## 2017-05-21 DIAGNOSIS — Z5309 Procedure and treatment not carried out because of other contraindication: Secondary | ICD-10-CM | POA: Diagnosis not present

## 2017-05-21 DIAGNOSIS — R21 Rash and other nonspecific skin eruption: Secondary | ICD-10-CM | POA: Diagnosis not present

## 2017-05-21 DIAGNOSIS — M1712 Unilateral primary osteoarthritis, left knee: Secondary | ICD-10-CM | POA: Diagnosis not present

## 2017-05-21 LAB — TYPE AND SCREEN
ABO/RH(D): O POS
ANTIBODY SCREEN: NEGATIVE

## 2017-05-21 SURGERY — ARTHROPLASTY, KNEE, TOTAL, USING IMAGELESS COMPUTER-ASSISTED NAVIGATION
Anesthesia: Spinal | Site: Knee | Laterality: Left

## 2017-05-21 MED ORDER — DEXAMETHASONE SODIUM PHOSPHATE 10 MG/ML IJ SOLN
INTRAMUSCULAR | Status: AC
  Filled 2017-05-21: qty 1

## 2017-05-21 MED ORDER — FENTANYL CITRATE (PF) 100 MCG/2ML IJ SOLN
INTRAMUSCULAR | Status: AC
  Filled 2017-05-21: qty 2

## 2017-05-21 MED ORDER — CEFAZOLIN SODIUM-DEXTROSE 2-4 GM/100ML-% IV SOLN
2.0000 g | INTRAVENOUS | Status: DC
  Filled 2017-05-21: qty 100

## 2017-05-21 MED ORDER — SODIUM CHLORIDE 0.9 % IJ SOLN
INTRAMUSCULAR | Status: AC
  Filled 2017-05-21: qty 50

## 2017-05-21 MED ORDER — MIDAZOLAM HCL 2 MG/2ML IJ SOLN
INTRAMUSCULAR | Status: AC
  Filled 2017-05-21: qty 2

## 2017-05-21 MED ORDER — PROPOFOL 10 MG/ML IV BOLUS
INTRAVENOUS | Status: AC
Start: 2017-05-21 — End: 2017-05-21
  Filled 2017-05-21: qty 40

## 2017-05-21 MED ORDER — SODIUM CHLORIDE 0.9 % IV SOLN: INTRAVENOUS | Status: DC

## 2017-05-21 MED ORDER — LACTATED RINGERS IV SOLN: INTRAVENOUS | Status: DC

## 2017-05-21 MED ORDER — POVIDONE-IODINE 10 % EX SWAB: 2.0000 "application " | Freq: Once | CUTANEOUS | Status: DC

## 2017-05-21 MED ORDER — BUPIVACAINE HCL (PF) 0.25 % IJ SOLN
INTRAMUSCULAR | Status: AC
  Filled 2017-05-21: qty 30

## 2017-05-21 MED ORDER — ISOPROPYL ALCOHOL 70 % SOLN
Status: AC
  Filled 2017-05-21: qty 480

## 2017-05-21 MED ORDER — ONDANSETRON HCL 4 MG/2ML IJ SOLN
INTRAMUSCULAR | Status: AC
  Filled 2017-05-21: qty 2

## 2017-05-21 MED ORDER — KETOROLAC TROMETHAMINE 30 MG/ML IJ SOLN
INTRAMUSCULAR | Status: AC
  Filled 2017-05-21: qty 1

## 2017-05-21 MED ORDER — ACETAMINOPHEN 10 MG/ML IV SOLN
1000.0000 mg | INTRAVENOUS | Status: DC
  Filled 2017-05-21: qty 100

## 2017-05-21 MED ORDER — CHLORHEXIDINE GLUCONATE 4 % EX LIQD: 60.0000 mL | Freq: Once | CUTANEOUS | Status: DC

## 2017-05-21 NOTE — Anesthesia Preprocedure Evaluation (Signed)
Anesthesia Evaluation  Patient identified by MRN, date of birth, ID band Patient awake    Reviewed: Allergy & Precautions, NPO status , Patient's Chart, lab work & pertinent test results  Airway Mallampati: II  TM Distance: >3 FB     Dental   Pulmonary sleep apnea ,    breath sounds clear to auscultation       Cardiovascular hypertension, + dysrhythmias  Rhythm:Regular Rate:Normal     Neuro/Psych    GI/Hepatic negative GI ROS, Neg liver ROS,   Endo/Other  negative endocrine ROS  Renal/GU negative Renal ROS     Musculoskeletal  (+) Arthritis ,   Abdominal   Peds  Hematology   Anesthesia Other Findings   Reproductive/Obstetrics                             Anesthesia Physical Anesthesia Plan  ASA: III  Anesthesia Plan:    Post-op Pain Management:    Induction: Intravenous  PONV Risk Score and Plan: 2 and Treatment may vary due to age or medical condition  Airway Management Planned:   Additional Equipment:   Intra-op Plan:   Post-operative Plan:   Informed Consent: I have reviewed the patients History and Physical, chart, labs and discussed the procedure including the risks, benefits and alternatives for the proposed anesthesia with the patient or authorized representative who has indicated his/her understanding and acceptance.   Dental advisory given  Plan Discussed with:   Anesthesia Plan Comments:         Anesthesia Quick Evaluation

## 2017-05-21 NOTE — OR Nursing (Signed)
[phone]  Case cancelled due to patient has a rash per MD.

## 2017-05-21 NOTE — Progress Notes (Signed)
Noted that after shave and prep pt with fine red rash over knee. Had few areas prior to prep. Per Mr [name] [name] he has been dealing with it for months- indicates behind the knee. Reports he applied steriod cream last night. Pt poor historian/ gives different answers different times.  After discussion with patient wife and Dr Lyla Glassing surgery is cancelled. To follow up with Dermatologist and office will reschedule office visit and surgery.

## 2017-05-21 NOTE — Discharge Instructions (Signed)
Follow up with your dermatologist. Dr Sherrie George office will call you to reschedule appointment.

## 2017-05-26 ENCOUNTER — Ambulatory Visit: Payer: Self-pay | Admitting: Orthopedic Surgery

## 2017-06-01 NOTE — Progress Notes (Signed)
LVMM for patient on 05/26/2017, 05/27/2017 and on 06/01/2017 to let patient be aware of new date and time of surgery on 06/04/2017 and to review instructions.  Patient has not returned call.  LVMM for Santiago Bur regarding above.

## 2017-06-03 ENCOUNTER — Encounter (HOSPITAL_COMMUNITY): Payer: Self-pay | Admitting: *Deleted

## 2017-06-03 MED ORDER — TRANEXAMIC ACID 1000 MG/10ML IV SOLN
1000.0000 mg | INTRAVENOUS | Status: AC
  Administered 2017-06-04: 1000 mg via INTRAVENOUS
  Filled 2017-06-03: qty 1100

## 2017-06-03 NOTE — Anesthesia Preprocedure Evaluation (Addendum)
Anesthesia Evaluation  Patient identified by MRN, date of birth, ID band Patient awake    Reviewed: Allergy & Precautions, H&P , NPO status , Patient's Chart, lab work & pertinent test results  Airway Mallampati: II  TM Distance: >3 FB Neck ROM: Full    Dental no notable dental hx.    Pulmonary sleep apnea and Continuous Positive Airway Pressure Ventilation ,    Pulmonary exam normal breath sounds clear to auscultation       Cardiovascular Exercise Tolerance: Good hypertension, Pt. on medications Normal cardiovascular exam+ dysrhythmias Atrial Fibrillation  Rhythm:Regular Rate:Normal     Neuro/Psych Bipolar Disorder Dementia CVA    GI/Hepatic negative GI ROS, Neg liver ROS,   Endo/Other  negative endocrine ROS  Renal/GU negative Renal ROS     Musculoskeletal  (+) Arthritis , Osteoarthritis,    Abdominal   Peds  Hematology negative hematology ROS (+)   Anesthesia Other Findings   Reproductive/Obstetrics                          Lab Results  Component Value Date   WBC 4.9 05/19/2017   HGB 12.5 (L) 05/19/2017   HCT 38.9 (L) 05/19/2017   MCV 95.1 05/19/2017   PLT 154 05/19/2017    Anesthesia Physical Anesthesia Plan  ASA: III  Anesthesia Plan: Spinal and Regional   Post-op Pain Management:  Regional for Post-op pain   Induction:   PONV Risk Score and Plan:   Airway Management Planned: Mask, Natural Airway and Nasal Cannula  Additional Equipment:   Intra-op Plan:   Post-operative Plan:   Informed Consent: I have reviewed the patients History and Physical, chart, labs and discussed the procedure including the risks, benefits and alternatives for the proposed anesthesia with the patient or authorized representative who has indicated his/her understanding and acceptance.     Plan Discussed with: CRNA  Anesthesia Plan Comments:        Anesthesia Quick Evaluation

## 2017-06-03 NOTE — Progress Notes (Signed)
LVMM on [phone] with surgery time of 1230-1500 and to arrive at 1000am in Admitting and to follow same preop instructions as for 05/21/2017.  Asked for call back from patient.

## 2017-06-04 ENCOUNTER — Other Ambulatory Visit: Payer: Self-pay

## 2017-06-04 ENCOUNTER — Inpatient Hospital Stay (HOSPITAL_COMMUNITY)
Admission: RE | Admit: 2017-06-04 | Discharge: 2017-06-06 | DRG: 470 | Disposition: A | Discharge status: Home Health | Payer: Medicare Other | Source: Ambulatory Visit | Attending: Orthopedic Surgery | Admitting: Orthopedic Surgery

## 2017-06-04 ENCOUNTER — Inpatient Hospital Stay (HOSPITAL_COMMUNITY): Payer: Medicare Other | Admitting: Anesthesiology

## 2017-06-04 ENCOUNTER — Inpatient Hospital Stay (HOSPITAL_COMMUNITY): Payer: Medicare Other

## 2017-06-04 ENCOUNTER — Encounter (HOSPITAL_COMMUNITY): Payer: Self-pay | Admitting: Emergency Medicine

## 2017-06-04 ENCOUNTER — Encounter (HOSPITAL_COMMUNITY)
Admission: RE | Disposition: A | Discharge status: Home Health | Payer: Self-pay | Source: Ambulatory Visit | Attending: Orthopedic Surgery

## 2017-06-04 DIAGNOSIS — F0280 Dementia in other diseases classified elsewhere without behavioral disturbance: Secondary | ICD-10-CM | POA: Diagnosis present

## 2017-06-04 DIAGNOSIS — Z79899 Other long term (current) drug therapy: Secondary | ICD-10-CM | POA: Diagnosis not present

## 2017-06-04 DIAGNOSIS — M1712 Unilateral primary osteoarthritis, left knee: Principal | ICD-10-CM | POA: Diagnosis present

## 2017-06-04 DIAGNOSIS — G4730 Sleep apnea, unspecified: Secondary | ICD-10-CM | POA: Diagnosis present

## 2017-06-04 DIAGNOSIS — M545 Low back pain: Secondary | ICD-10-CM | POA: Diagnosis present

## 2017-06-04 DIAGNOSIS — E785 Hyperlipidemia, unspecified: Secondary | ICD-10-CM | POA: Diagnosis present

## 2017-06-04 DIAGNOSIS — G8929 Other chronic pain: Secondary | ICD-10-CM | POA: Diagnosis present

## 2017-06-04 DIAGNOSIS — I10 Essential (primary) hypertension: Secondary | ICD-10-CM | POA: Diagnosis present

## 2017-06-04 DIAGNOSIS — G309 Alzheimer's disease, unspecified: Secondary | ICD-10-CM | POA: Diagnosis present

## 2017-06-04 DIAGNOSIS — Z96641 Presence of right artificial hip joint: Secondary | ICD-10-CM | POA: Diagnosis present

## 2017-06-04 DIAGNOSIS — Z96652 Presence of left artificial knee joint: Secondary | ICD-10-CM

## 2017-06-04 DIAGNOSIS — Z7901 Long term (current) use of anticoagulants: Secondary | ICD-10-CM

## 2017-06-04 DIAGNOSIS — M1990 Unspecified osteoarthritis, unspecified site: Secondary | ICD-10-CM | POA: Diagnosis present

## 2017-06-04 DIAGNOSIS — F319 Bipolar disorder, unspecified: Secondary | ICD-10-CM | POA: Diagnosis present

## 2017-06-04 DIAGNOSIS — Z91040 Latex allergy status: Secondary | ICD-10-CM | POA: Diagnosis not present

## 2017-06-04 DIAGNOSIS — I482 Chronic atrial fibrillation: Secondary | ICD-10-CM | POA: Diagnosis present

## 2017-06-04 DIAGNOSIS — F0150 Vascular dementia without behavioral disturbance: Secondary | ICD-10-CM | POA: Diagnosis present

## 2017-06-04 DIAGNOSIS — Z8673 Personal history of transient ischemic attack (TIA), and cerebral infarction without residual deficits: Secondary | ICD-10-CM | POA: Diagnosis not present

## 2017-06-04 DIAGNOSIS — Z809 Family history of malignant neoplasm, unspecified: Secondary | ICD-10-CM

## 2017-06-04 DIAGNOSIS — M25562 Pain in left knee: Secondary | ICD-10-CM | POA: Diagnosis present

## 2017-06-04 HISTORY — PX: KNEE ARTHROPLASTY: SHX992

## 2017-06-04 LAB — TYPE AND SCREEN
ABO/RH(D): O POS
ANTIBODY SCREEN: NEGATIVE

## 2017-06-04 SURGERY — ARTHROPLASTY, KNEE, TOTAL, USING IMAGELESS COMPUTER-ASSISTED NAVIGATION
Anesthesia: Regional | Site: Knee | Laterality: Left

## 2017-06-04 MED ORDER — EPHEDRINE SULFATE-NACL 50-0.9 MG/10ML-% IV SOSY
PREFILLED_SYRINGE | INTRAVENOUS | Status: DC | PRN
  Administered 2017-06-04: 5 mg via INTRAVENOUS

## 2017-06-04 MED ORDER — FOLIC ACID 1 MG PO TABS
1.0000 mg | ORAL_TABLET | Freq: Every day | ORAL | Status: DC
  Administered 2017-06-04 – 2017-06-05 (×2): 1 mg via ORAL
  Filled 2017-06-04 (×2): qty 1

## 2017-06-04 MED ORDER — HYDROMORPHONE HCL 1 MG/ML IJ SOLN: 0.2500 mg | INTRAMUSCULAR | Status: DC | PRN

## 2017-06-04 MED ORDER — PHENOL 1.4 % MT LIQD
1.0000 | OROMUCOSAL | Status: DC | PRN
  Filled 2017-06-04: qty 177

## 2017-06-04 MED ORDER — SODIUM CHLORIDE 0.9 % IV SOLN
INTRAVENOUS | Status: DC
  Administered 2017-06-04: 18:00:00 via INTRAVENOUS

## 2017-06-04 MED ORDER — ONDANSETRON HCL 4 MG PO TABS: 4.0000 mg | ORAL_TABLET | Freq: Four times a day (QID) | ORAL | Status: DC | PRN

## 2017-06-04 MED ORDER — METOCLOPRAMIDE HCL 5 MG/ML IJ SOLN: 5.0000 mg | Freq: Three times a day (TID) | INTRAMUSCULAR | Status: DC | PRN

## 2017-06-04 MED ORDER — B COMPLEX-C PO TABS
1.0000 | ORAL_TABLET | Freq: Every day | ORAL | Status: DC
  Administered 2017-06-04 – 2017-06-06 (×3): 1 via ORAL
  Filled 2017-06-04 (×3): qty 1

## 2017-06-04 MED ORDER — SODIUM CHLORIDE 0.9 % IR SOLN
Status: DC | PRN
  Administered 2017-06-04 (×2): 1000 mL

## 2017-06-04 MED ORDER — CARBIDOPA-LEVODOPA 25-250 MG PO TABS
1.0000 | ORAL_TABLET | Freq: Two times a day (BID) | ORAL | Status: DC
  Administered 2017-06-04 – 2017-06-06 (×4): 1 via ORAL
  Filled 2017-06-04 (×4): qty 1

## 2017-06-04 MED ORDER — MEPERIDINE HCL 50 MG/ML IJ SOLN: 6.2500 mg | INTRAMUSCULAR | Status: DC | PRN

## 2017-06-04 MED ORDER — KETOROLAC TROMETHAMINE 30 MG/ML IJ SOLN
INTRAMUSCULAR | Status: DC | PRN
  Administered 2017-06-04: 30 mg

## 2017-06-04 MED ORDER — ONDANSETRON HCL 4 MG/2ML IJ SOLN
INTRAMUSCULAR | Status: AC
  Filled 2017-06-04: qty 2

## 2017-06-04 MED ORDER — FENTANYL CITRATE (PF) 100 MCG/2ML IJ SOLN
100.0000 ug | Freq: Once | INTRAMUSCULAR | Status: AC
  Administered 2017-06-04: 25 ug via INTRAVENOUS
  Filled 2017-06-04: qty 2

## 2017-06-04 MED ORDER — PROMETHAZINE HCL 25 MG/ML IJ SOLN: 6.2500 mg | INTRAMUSCULAR | Status: DC | PRN

## 2017-06-04 MED ORDER — MENTHOL 3 MG MT LOZG: 1.0000 | LOZENGE | OROMUCOSAL | Status: DC | PRN

## 2017-06-04 MED ORDER — LACTATED RINGERS IV SOLN
INTRAVENOUS | Status: DC
  Administered 2017-06-04 (×2): via INTRAVENOUS

## 2017-06-04 MED ORDER — ONDANSETRON HCL 4 MG/2ML IJ SOLN
INTRAMUSCULAR | Status: DC | PRN
  Administered 2017-06-04: 4 mg via INTRAVENOUS

## 2017-06-04 MED ORDER — FENTANYL CITRATE (PF) 100 MCG/2ML IJ SOLN
INTRAMUSCULAR | Status: DC | PRN
  Administered 2017-06-04: 25 ug via INTRAVENOUS

## 2017-06-04 MED ORDER — MIDAZOLAM HCL 2 MG/2ML IJ SOLN
INTRAMUSCULAR | Status: AC
  Filled 2017-06-04: qty 2

## 2017-06-04 MED ORDER — DEXAMETHASONE SODIUM PHOSPHATE 10 MG/ML IJ SOLN
INTRAMUSCULAR | Status: AC
  Filled 2017-06-04: qty 1

## 2017-06-04 MED ORDER — MIDAZOLAM HCL 2 MG/2ML IJ SOLN: 2.0000 mg | Freq: Once | INTRAMUSCULAR | Status: DC

## 2017-06-04 MED ORDER — METHOCARBAMOL 1000 MG/10ML IJ SOLN
500.0000 mg | Freq: Four times a day (QID) | INTRAMUSCULAR | Status: DC | PRN
Start: 2017-06-04 — End: 2017-06-06
  Administered 2017-06-04: 23:00:00 500 mg via INTRAVENOUS
  Filled 2017-06-04: qty 550

## 2017-06-04 MED ORDER — BUPIVACAINE HCL (PF) 0.25 % IJ SOLN
INTRAMUSCULAR | Status: DC | PRN
  Administered 2017-06-04: 30 mL

## 2017-06-04 MED ORDER — HYDROCODONE-ACETAMINOPHEN 7.5-325 MG PO TABS: 1.0000 | ORAL_TABLET | ORAL | Status: DC | PRN

## 2017-06-04 MED ORDER — ONDANSETRON HCL 4 MG/2ML IJ SOLN: 4.0000 mg | Freq: Four times a day (QID) | INTRAMUSCULAR | Status: DC | PRN

## 2017-06-04 MED ORDER — PROPOFOL 500 MG/50ML IV EMUL
INTRAVENOUS | Status: DC | PRN
  Administered 2017-06-04: 50 ug/kg/min via INTRAVENOUS

## 2017-06-04 MED ORDER — MIDAZOLAM HCL 5 MG/5ML IJ SOLN
INTRAMUSCULAR | Status: DC | PRN
  Administered 2017-06-04: 0.5 mg via INTRAVENOUS

## 2017-06-04 MED ORDER — SODIUM CHLORIDE 0.9 % IV SOLN: INTRAVENOUS | Status: DC

## 2017-06-04 MED ORDER — ISOPROPYL ALCOHOL 70 % SOLN
Status: DC | PRN
  Administered 2017-06-04: 1 via TOPICAL

## 2017-06-04 MED ORDER — ATORVASTATIN CALCIUM 40 MG PO TABS
40.0000 mg | ORAL_TABLET | Freq: Every day | ORAL | Status: DC
  Administered 2017-06-04 – 2017-06-06 (×3): 40 mg via ORAL
  Filled 2017-06-04 (×3): qty 1

## 2017-06-04 MED ORDER — SODIUM CHLORIDE 0.9 % IJ SOLN
INTRAMUSCULAR | Status: AC
  Filled 2017-06-04: qty 50

## 2017-06-04 MED ORDER — B COMPLEX PO TABS: 1.0000 | ORAL_TABLET | Freq: Every day | ORAL | Status: DC

## 2017-06-04 MED ORDER — KETOROLAC TROMETHAMINE 15 MG/ML IJ SOLN
7.5000 mg | Freq: Four times a day (QID) | INTRAMUSCULAR | Status: AC
  Administered 2017-06-04 – 2017-06-05 (×4): 7.5 mg via INTRAVENOUS
  Filled 2017-06-04 (×4): qty 1

## 2017-06-04 MED ORDER — EPHEDRINE 5 MG/ML INJ
INTRAVENOUS | Status: AC
  Filled 2017-06-04: qty 10

## 2017-06-04 MED ORDER — BUPIVACAINE HCL (PF) 0.25 % IJ SOLN
INTRAMUSCULAR | Status: AC
  Filled 2017-06-04: qty 30

## 2017-06-04 MED ORDER — METOCLOPRAMIDE HCL 5 MG PO TABS: 5.0000 mg | ORAL_TABLET | Freq: Three times a day (TID) | ORAL | Status: DC | PRN

## 2017-06-04 MED ORDER — CHLORHEXIDINE GLUCONATE 4 % EX LIQD: 60.0000 mL | Freq: Once | CUTANEOUS | Status: DC

## 2017-06-04 MED ORDER — DIPHENHYDRAMINE HCL 12.5 MG/5ML PO ELIX: 12.5000 mg | ORAL_SOLUTION | ORAL | Status: DC | PRN

## 2017-06-04 MED ORDER — FENTANYL CITRATE (PF) 100 MCG/2ML IJ SOLN
INTRAMUSCULAR | Status: AC
  Filled 2017-06-04: qty 2

## 2017-06-04 MED ORDER — PROPOFOL 10 MG/ML IV BOLUS
INTRAVENOUS | Status: AC
  Filled 2017-06-04: qty 60

## 2017-06-04 MED ORDER — ACETAMINOPHEN 10 MG/ML IV SOLN: 1000.0000 mg | Freq: Once | INTRAVENOUS | Status: DC | PRN

## 2017-06-04 MED ORDER — MEMANTINE HCL 10 MG PO TABS
10.0000 mg | ORAL_TABLET | Freq: Two times a day (BID) | ORAL | Status: DC
  Administered 2017-06-04 – 2017-06-06 (×4): 10 mg via ORAL
  Filled 2017-06-04 (×4): qty 1

## 2017-06-04 MED ORDER — LAMOTRIGINE 25 MG PO TABS
150.0000 mg | ORAL_TABLET | Freq: Two times a day (BID) | ORAL | Status: DC
  Administered 2017-06-04 – 2017-06-06 (×4): 150 mg via ORAL
  Filled 2017-06-04: qty 1
  Filled 2017-06-04 (×3): qty 2

## 2017-06-04 MED ORDER — KETOROLAC TROMETHAMINE 30 MG/ML IJ SOLN
INTRAMUSCULAR | Status: AC
  Filled 2017-06-04: qty 1

## 2017-06-04 MED ORDER — ACETAMINOPHEN 10 MG/ML IV SOLN
1000.0000 mg | INTRAVENOUS | Status: AC
  Administered 2017-06-04: 1000 mg via INTRAVENOUS
  Filled 2017-06-04: qty 100

## 2017-06-04 MED ORDER — METHOCARBAMOL 500 MG PO TABS
500.0000 mg | ORAL_TABLET | Freq: Four times a day (QID) | ORAL | Status: DC | PRN
  Administered 2017-06-05 – 2017-06-06 (×2): 500 mg via ORAL
  Filled 2017-06-04 (×2): qty 1

## 2017-06-04 MED ORDER — PROPOFOL 10 MG/ML IV BOLUS
INTRAVENOUS | Status: DC | PRN
  Administered 2017-06-04: 20 mg via INTRAVENOUS

## 2017-06-04 MED ORDER — HYDROCODONE-ACETAMINOPHEN 7.5-325 MG PO TABS: 1.0000 | ORAL_TABLET | Freq: Once | ORAL | Status: DC | PRN

## 2017-06-04 MED ORDER — ACETAMINOPHEN 325 MG PO TABS: 325.0000 mg | ORAL_TABLET | Freq: Four times a day (QID) | ORAL | Status: DC | PRN

## 2017-06-04 MED ORDER — SODIUM CHLORIDE 0.9 % IR SOLN
Status: DC | PRN
  Administered 2017-06-04: 1000 mL

## 2017-06-04 MED ORDER — BUPIVACAINE IN DEXTROSE 0.75-8.25 % IT SOLN
INTRATHECAL | Status: DC | PRN
  Administered 2017-06-04: 2 mL via INTRATHECAL

## 2017-06-04 MED ORDER — MORPHINE SULFATE (PF) 2 MG/ML IV SOLN: 0.5000 mg | INTRAVENOUS | Status: DC | PRN

## 2017-06-04 MED ORDER — RIVAROXABAN 10 MG PO TABS
10.0000 mg | ORAL_TABLET | Freq: Every day | ORAL | Status: DC
  Administered 2017-06-05 – 2017-06-06 (×2): 10 mg via ORAL
  Filled 2017-06-04 (×2): qty 1

## 2017-06-04 MED ORDER — DIVALPROEX SODIUM ER 500 MG PO TB24
500.0000 mg | ORAL_TABLET | Freq: Every day | ORAL | Status: DC
  Administered 2017-06-04 – 2017-06-05 (×2): 500 mg via ORAL
  Filled 2017-06-04 (×2): qty 1

## 2017-06-04 MED ORDER — SODIUM CHLORIDE 0.9 % IJ SOLN
INTRAMUSCULAR | Status: DC | PRN
  Administered 2017-06-04: 30 mL

## 2017-06-04 MED ORDER — LISINOPRIL 10 MG PO TABS
10.0000 mg | ORAL_TABLET | Freq: Every day | ORAL | Status: DC
  Administered 2017-06-05 – 2017-06-06 (×2): 10 mg via ORAL
  Filled 2017-06-04 (×3): qty 1

## 2017-06-04 MED ORDER — DONEPEZIL HCL 10 MG PO TABS
10.0000 mg | ORAL_TABLET | Freq: Every day | ORAL | Status: DC
  Administered 2017-06-04 – 2017-06-05 (×2): 10 mg via ORAL
  Filled 2017-06-04 (×2): qty 1

## 2017-06-04 MED ORDER — DEXAMETHASONE SODIUM PHOSPHATE 10 MG/ML IJ SOLN
INTRAMUSCULAR | Status: DC | PRN
  Administered 2017-06-04: 10 mg via INTRAVENOUS

## 2017-06-04 MED ORDER — GABAPENTIN 100 MG PO CAPS
200.0000 mg | ORAL_CAPSULE | Freq: Two times a day (BID) | ORAL | Status: DC
  Administered 2017-06-04 – 2017-06-06 (×4): 200 mg via ORAL
  Filled 2017-06-04 (×4): qty 2

## 2017-06-04 MED ORDER — DEXAMETHASONE SODIUM PHOSPHATE 10 MG/ML IJ SOLN
10.0000 mg | Freq: Once | INTRAMUSCULAR | Status: AC
  Administered 2017-06-05: 11:00:00 10 mg via INTRAVENOUS
  Filled 2017-06-04: qty 1

## 2017-06-04 MED ORDER — POLYETHYLENE GLYCOL 3350 17 G PO PACK
17.0000 g | PACK | Freq: Every day | ORAL | Status: DC | PRN
  Administered 2017-06-05: 17 g via ORAL
  Filled 2017-06-04: qty 1

## 2017-06-04 MED ORDER — ALUM & MAG HYDROXIDE-SIMETH 200-200-20 MG/5ML PO SUSP: 30.0000 mL | ORAL | Status: DC | PRN

## 2017-06-04 MED ORDER — ROPIVACAINE HCL 7.5 MG/ML IJ SOLN
INTRAMUSCULAR | Status: DC | PRN
  Administered 2017-06-04: 20 mL via PERINEURAL

## 2017-06-04 MED ORDER — HYDROCODONE-ACETAMINOPHEN 5-325 MG PO TABS
1.0000 | ORAL_TABLET | ORAL | Status: DC | PRN
  Administered 2017-06-05: 2 via ORAL
  Administered 2017-06-05 (×3): 1 via ORAL
  Administered 2017-06-06 (×2): 2 via ORAL
  Filled 2017-06-04: qty 2
  Filled 2017-06-04: qty 1
  Filled 2017-06-04: qty 2
  Filled 2017-06-04: qty 1
  Filled 2017-06-04: qty 2
  Filled 2017-06-04: qty 1

## 2017-06-04 MED ORDER — ISOPROPYL ALCOHOL 70 % SOLN
Status: AC
  Filled 2017-06-04: qty 480

## 2017-06-04 MED ORDER — DOCUSATE SODIUM 100 MG PO CAPS
100.0000 mg | ORAL_CAPSULE | Freq: Two times a day (BID) | ORAL | Status: DC
  Administered 2017-06-04 – 2017-06-06 (×4): 100 mg via ORAL
  Filled 2017-06-04 (×4): qty 1

## 2017-06-04 MED ORDER — STERILE WATER FOR IRRIGATION IR SOLN
Status: DC | PRN
  Administered 2017-06-04: 2000 mL

## 2017-06-04 MED ORDER — CEFAZOLIN SODIUM-DEXTROSE 2-4 GM/100ML-% IV SOLN
2.0000 g | INTRAVENOUS | Status: AC
  Administered 2017-06-04: 2 g via INTRAVENOUS
  Filled 2017-06-04: qty 100

## 2017-06-04 MED ORDER — CEFAZOLIN SODIUM-DEXTROSE 2-4 GM/100ML-% IV SOLN
2.0000 g | Freq: Four times a day (QID) | INTRAVENOUS | Status: AC
  Administered 2017-06-04 – 2017-06-05 (×2): 2 g via INTRAVENOUS
  Filled 2017-06-04 (×2): qty 100

## 2017-06-04 MED ORDER — POVIDONE-IODINE 10 % EX SWAB
2.0000 "application " | Freq: Once | CUTANEOUS | Status: AC
  Administered 2017-06-04: 2 via TOPICAL

## 2017-06-04 SURGICAL SUPPLY — 65 items
ADH SKN CLS APL DERMABOND .7 (GAUZE/BANDAGES/DRESSINGS) ×1
BAG SPEC THK2 15X12 ZIP CLS (MISCELLANEOUS)
BAG ZIPLOCK 12X15 (MISCELLANEOUS) IMPLANT
BANDAGE ACE 4X5 VEL STRL LF (GAUZE/BANDAGES/DRESSINGS) ×3 IMPLANT
BANDAGE ACE 6X5 VEL STRL LF (GAUZE/BANDAGES/DRESSINGS) ×3 IMPLANT
BLADE SAW RECIPROCATING 77.5 (BLADE) ×3 IMPLANT
BSPLAT TIB 6 KN TRITANIUM (Knees) ×1 IMPLANT
CHLORAPREP W/TINT 26ML (MISCELLANEOUS) ×6 IMPLANT
COMPONENT TRI CR RETAIN SZ6 LT (Orthopedic Implant) IMPLANT
COVER SURGICAL LIGHT HANDLE (MISCELLANEOUS) ×3 IMPLANT
CRUCIATE RETAINING FEMORAL ×1 IMPLANT
CUFF TOURN SGL QUICK 34 (TOURNIQUET CUFF) ×3
CUFF TRNQT CYL 34X4X40X1 (TOURNIQUET CUFF) ×1 IMPLANT
DECANTER SPIKE VIAL GLASS SM (MISCELLANEOUS) ×6 IMPLANT
DERMABOND ADVANCED (GAUZE/BANDAGES/DRESSINGS) ×2
DERMABOND ADVANCED .7 DNX12 (GAUZE/BANDAGES/DRESSINGS) ×2 IMPLANT
DRAPE SHEET LG 3/4 BI-LAMINATE (DRAPES) ×6 IMPLANT
DRAPE U-SHAPE 47X51 STRL (DRAPES) ×3 IMPLANT
DRSG AQUACEL AG ADV 3.5X10 (GAUZE/BANDAGES/DRESSINGS) ×3 IMPLANT
DRSG TEGADERM 4X4.75 (GAUZE/BANDAGES/DRESSINGS) IMPLANT
ELECT BLADE TIP CTD 4 INCH (ELECTRODE) ×3 IMPLANT
ELECT REM PT RETURN 15FT ADLT (MISCELLANEOUS) ×3 IMPLANT
EVACUATOR 1/8 PVC DRAIN (DRAIN) IMPLANT
GAUZE SPONGE 4X4 12PLY STRL (GAUZE/BANDAGES/DRESSINGS) ×3 IMPLANT
GLOVE BIO SURGEON STRL SZ8.5 (GLOVE) ×6 IMPLANT
GLOVE BIOGEL PI IND STRL 7.5 (GLOVE) IMPLANT
GLOVE BIOGEL PI IND STRL 8.5 (GLOVE) ×1 IMPLANT
GLOVE BIOGEL PI INDICATOR 7.5 (GLOVE) ×16
GLOVE BIOGEL PI INDICATOR 8.5 (GLOVE) ×2
GOWN L4 XLG 20 PK N/S (GOWN DISPOSABLE) ×8 IMPLANT
GOWN SPEC L3 XXLG W/TWL (GOWN DISPOSABLE) ×3 IMPLANT
HANDPIECE INTERPULSE COAX TIP (DISPOSABLE) ×3
HOOD PEEL AWAY FLYTE STAYCOOL (MISCELLANEOUS) ×6 IMPLANT
INSERT TIB 6 11XBRNG CRCTE RTN (Insert) IMPLANT
KNEE INSERT TIB BRG (Insert) ×3 IMPLANT
KNEE TIBIAL COMPONENT SZ6 (Knees) ×2 IMPLANT
MARKER SKIN DUAL TIP RULER LAB (MISCELLANEOUS) ×3 IMPLANT
NDL SPNL 18GX3.5 QUINCKE PK (NEEDLE) ×1 IMPLANT
NEEDLE SPNL 18GX3.5 QUINCKE PK (NEEDLE) ×3 IMPLANT
NS IRRIG 1000ML POUR BTL (IV SOLUTION) ×3 IMPLANT
PACK TOTAL KNEE CUSTOM (KITS) ×3 IMPLANT
PADDING CAST COTTON 6X4 STRL (CAST SUPPLIES) ×3 IMPLANT
PATELLA ASYMMETRIC 38X11 KNEE (Orthopedic Implant) ×2 IMPLANT
POSITIONER SURGICAL ARM (MISCELLANEOUS) ×3 IMPLANT
SAW OSC TIP CART 19.5X105X1.3 (SAW) ×3 IMPLANT
SEALER BIPOLAR AQUA 6.0 (INSTRUMENTS) ×5 IMPLANT
SET HNDPC FAN SPRY TIP SCT (DISPOSABLE) ×1 IMPLANT
SET PAD KNEE POSITIONER (MISCELLANEOUS) ×3 IMPLANT
SPONGE DRAIN TRACH 4X4 STRL 2S (GAUZE/BANDAGES/DRESSINGS) IMPLANT
SPONGE LAP 18X18 X RAY DECT (DISPOSABLE) IMPLANT
SUT MNCRL AB 3-0 PS2 18 (SUTURE) ×3 IMPLANT
SUT MON AB 2-0 CT1 36 (SUTURE) ×6 IMPLANT
SUT STRATAFIX PDO 1 14 VIOLET (SUTURE) ×3
SUT STRATFX PDO 1 14 VIOLET (SUTURE) ×1
SUT VIC AB 1 CT1 36 (SUTURE) ×9 IMPLANT
SUT VIC AB 2-0 CT1 27 (SUTURE) ×3
SUT VIC AB 2-0 CT1 TAPERPNT 27 (SUTURE) ×1 IMPLANT
SUTURE STRATFX PDO 1 14 VIOLET (SUTURE) ×1 IMPLANT
SYR 50ML LL SCALE MARK (SYRINGE) ×3 IMPLANT
TOWER CARTRIDGE SMART MIX (DISPOSABLE) IMPLANT
TRAY FOLEY BAG SILVER LF 16FR (CATHETERS) ×2 IMPLANT
TRIATH CRUCIATE RETAIN SZ6 KNE (Orthopedic Implant) ×3 IMPLANT
WATER STERILE IRR 1000ML POUR (IV SOLUTION) ×6 IMPLANT
WRAP KNEE MAXI GEL POST OP (GAUZE/BANDAGES/DRESSINGS) ×3 IMPLANT
YANKAUER SUCT BULB TIP 10FT TU (MISCELLANEOUS) ×3 IMPLANT

## 2017-06-04 NOTE — Anesthesia Postprocedure Evaluation (Signed)
Anesthesia Post Note  Patient: [name] [name]  Procedure(s) Performed: LEFT TOTAL KNEE ARTHROPLASTY WITH COMPUTER NAVIGATION (Left Knee)     Patient location during evaluation: PACU Anesthesia Type: Regional and Spinal Level of consciousness: oriented and awake and alert Pain management: pain level controlled Vital Signs Assessment: post-procedure vital signs reviewed and stable Respiratory status: spontaneous breathing, respiratory function stable and patient connected to nasal cannula oxygen Cardiovascular status: blood pressure returned to baseline and stable Postop Assessment: no headache, no backache and no apparent nausea or vomiting Anesthetic complications: no    Last Vitals:  Vitals:   06/04/17 1530 06/04/17 1545  BP: (!) 143/82 131/82  Pulse: (!) 52 (!) 47  Resp: 14 14  Temp: 37.2 C   SpO2: 100% 100%    Last Pain:  Vitals:   06/04/17 1530  TempSrc:   PainSc: 0-No pain                 Barnet Glasgow

## 2017-06-04 NOTE — Discharge Instructions (Signed)
° °Dr. Brian Swinteck °Total Joint Specialist °[facility] °[address] °[place], [place] [address] °[phone] ° °TOTAL KNEE REPLACEMENT POSTOPERATIVE DIRECTIONS ° ° ° °Knee Rehabilitation, Guidelines Following Surgery  °Results after knee surgery are often greatly improved when you follow the exercise, range of motion and muscle strengthening exercises prescribed by your doctor. Safety measures are also important to protect the knee from further injury. Any time any of these exercises cause you to have increased pain or swelling in your knee joint, decrease the amount until you are comfortable again and slowly increase them. If you have problems or questions, call your caregiver or physical therapist for advice.  ° °WEIGHT BEARING °Weight bearing as tolerated with assist device (walker, cane, etc) as directed, use it as long as suggested by your surgeon or therapist, typically at least 4-6 weeks. ° °HOME CARE INSTRUCTIONS  °Remove items at home which could result in a fall. This includes throw rugs or furniture in walking pathways.  °Continue medications as instructed at time of discharge. °You may have some home medications which will be placed on hold until you complete the course of blood thinner medication.  °You may start showering once you are discharged home but do not submerge the incision under water. Just pat the incision dry and apply a dry gauze dressing on daily. °Walk with walker as instructed.  °You may resume a sexual relationship in one month or when given the OK by your doctor.  °· Use walker as long as suggested by your caregivers. °· Avoid periods of inactivity such as sitting longer than an hour when not asleep. This helps prevent blood clots.  °You may put full weight on your legs and walk as much as is comfortable.  °You may return to work once you are cleared by your doctor.  °Do not drive a car for 6 weeks or until released by you surgeon.  °· Do not drive  while taking narcotics.  °Wear the elastic stockings for three weeks following surgery during the day but you may remove then at night. °Make sure you keep all of your appointments after your operation with all of your doctors and caregivers. You should call the office at the above phone number and make an appointment for approximately two weeks after the date of your surgery. °Do not remove your surgical dressing. The dressing is waterproof; you may take showers in 3 days, but do not take tub baths or submerge the dressing. °Please pick up a stool softener and laxative for home use as long as you are requiring pain medications. °· ICE to the affected knee every three hours for 30 minutes at a time and then as needed for pain and swelling.  Continue to use ice on the knee for pain and swelling from surgery. You may notice swelling that will progress down to the foot and ankle.  This is normal after surgery.  Elevate the leg when you are not up walking on it.   °It is important for you to complete the blood thinner medication as prescribed by your doctor. °· Continue to use the breathing machine which will help keep your temperature down.  It is common for your temperature to cycle up and down following surgery, especially at night when you are not up moving around and exerting yourself.  The breathing machine keeps your lungs expanded and your temperature down. ° °RANGE OF MOTION AND STRENGTHENING EXERCISES  °Rehabilitation of the knee is important following   a knee injury or an operation. After just a few days of immobilization, the muscles of the thigh which control the knee become weakened and shrink (atrophy). Knee exercises are designed to build up the tone and strength of the thigh muscles and to improve knee motion. Often times heat used for twenty to thirty minutes before working out will loosen up your tissues and help with improving the range of motion but do not use heat for the first two weeks following  surgery. These exercises can be done on a training (exercise) mat, on the floor, on a table or on a bed. Use what ever works the best and is most comfortable for you Knee exercises include:  °Leg Lifts - While your knee is still immobilized in a splint or cast, you can do straight leg raises. Lift the leg to 60 degrees, hold for 3 sec, and slowly lower the leg. Repeat 10-20 times 2-3 times daily. Perform this exercise against resistance later as your knee gets better.  °Quad and Hamstring Sets - Tighten up the muscle on the front of the thigh (Quad) and hold for 5-10 sec. Repeat this 10-20 times hourly. Hamstring sets are done by pushing the foot backward against an object and holding for 5-10 sec. Repeat as with quad sets.  °A rehabilitation program following serious knee injuries can speed recovery and prevent re-injury in the future due to weakened muscles. Contact your doctor or a physical therapist for more information on knee rehabilitation.  ° °SKILLED REHAB INSTRUCTIONS: °If the patient is transferred to a skilled rehab facility following release from the hospital, a list of the current medications will be sent to the facility for the patient to continue.  When discharged from the skilled rehab facility, please have the facility set up the patient's Home Health Physical Therapy prior to being released. Also, the skilled facility will be responsible for providing the patient with their medications at time of release from the facility to include their pain medication, the muscle relaxants, and their blood thinner medication. If the patient is still at the rehab facility at time of the two week follow up appointment, the skilled rehab facility will also need to assist the patient in arranging follow up appointment in our office and any transportation needs. ° °MAKE SURE YOU:  °Understand these instructions.  °Will watch your condition.  °Will get help right away if you are not doing well or get worse.   ° ° °Pick up stool softner and laxative for home use following surgery while on pain medications. °Do NOT remove your dressing. You may shower.  °Do not take tub baths or submerge incision under water. °May shower starting three days after surgery. °Please use a clean towel to pat the incision dry following showers. °Continue to use ice for pain and swelling after surgery. °Do not use any lotions or creams on the incision until instructed by your surgeon. ° °

## 2017-06-04 NOTE — Anesthesia Procedure Notes (Signed)
Date/Time: 06/04/2017 12:37 PM Performed by: Sharlette Dense, CRNA Oxygen Delivery Method: Simple face mask

## 2017-06-04 NOTE — Transfer of Care (Signed)
Immediate Anesthesia Transfer of Care Note  Patient: Ryzen Deady Tovey  Procedure(s) Performed: LEFT TOTAL KNEE ARTHROPLASTY WITH COMPUTER NAVIGATION (Left Knee)  Patient Location: PACU  Anesthesia Type:MAC and Spinal  Level of Consciousness: awake, alert  and oriented  Airway & Oxygen Therapy: Patient Spontanous Breathing and Patient connected to face mask oxygen  Post-op Assessment: Report given to RN and Post -op Vital signs reviewed and stable  Post vital signs: Reviewed and stable  Last Vitals:  Vitals Value Taken Time  BP 143/82 06/04/2017  3:30 PM  Temp    Pulse 53 06/04/2017  3:33 PM  Resp 14 06/04/2017  3:33 PM  SpO2 100 % 06/04/2017  3:33 PM  Vitals shown include unvalidated device data.  Last Pain:  Vitals:   06/04/17 1107  TempSrc:   PainSc: 0-No pain      Patients Stated Pain Goal: 4 (52/77/82 4235)  Complications: No apparent anesthesia complications

## 2017-06-04 NOTE — Anesthesia Procedure Notes (Addendum)
Anesthesia Regional Block: Adductor canal block   Pre-Anesthetic Checklist: ,, timeout performed, Correct Patient, Correct Site, Correct Laterality, Correct Procedure, Correct Position, site marked, Risks and benefits discussed,  Surgical consent,  Pre-op evaluation,  At surgeon's request and post-op pain management  Laterality: Left  Prep: chloraprep       Needles:  Injection technique: Single-shot  Needle Type: Echogenic Needle     Needle Length: 9cm  Needle Gauge: 21     Additional Needles:   Procedures:,,,, ultrasound used (permanent image in chart),,,,  Narrative:  Start time: 06/04/2017 11:50 AM End time: 06/04/2017 11:57 AM Injection made incrementally with aspirations every 5 mL.  Performed by: Personally  Anesthesiologist: Barnet Glasgow, MD

## 2017-06-04 NOTE — Progress Notes (Signed)
AssistedDr. Houser with left, ultrasound guided, adductor canal block. Side rails up, monitors on throughout procedure. See vital signs in flow sheet. Tolerated Procedure well.  

## 2017-06-04 NOTE — Progress Notes (Signed)
Dr. Valma Cava notified of patient's heartrate in upper 30's and 40's at times. Pt in atrial fibrillation.

## 2017-06-04 NOTE — Op Note (Signed)
OPERATIVE REPORT  SURGEON: Rod Can, MD   ASSISTANT: Nehemiah Massed, PA-C.  PREOPERATIVE DIAGNOSIS: Left knee arthritis.   POSTOPERATIVE DIAGNOSIS: Left knee arthritis.   PROCEDURE: Left total knee arthroplasty.   IMPLANTS: Stryker Triathlon CR femur, size 6. Stryker Tritanium tibia, size 6. X3 polyethelyene insert, size 11 mm, CR. 3 button asymmetric patella, size 38 mm.  ANESTHESIA:  Regional and Spinal  TOURNIQUET TIME: Not utilized.   ESTIMATED BLOOD LOSS:-400 mL    ANTIBIOTICS: 2 g Ancef.  DRAINS: None.  COMPLICATIONS: None   CONDITION: PACU - hemodynamically stable.   BRIEF CLINICAL NOTE: [name] [name] is a 72 y.o. male with a long-standing history of Left knee arthritis. After failing conservative management, the patient was indicated for total knee arthroplasty. The risks, benefits, and alternatives to the procedure were explained, and the patient elected to proceed.  PROCEDURE IN DETAIL: Adductor canal block was obtained in the pre-op holding area. Once inside the operative room, spinal anesthesia was obtained, and a foley catheter was inserted. The patient was then positioned, a nonsterile tourniquet was placed, and the lower extremity was prepped and draped in the normal sterile surgical fashion. A time-out was called verifying side and site of surgery. The patient received IV antibiotics within 60 minutes of beginning the procedure. The tourniquet was not utilized.  An anterior approach to the knee was performed utilizing a midvastus arthrotomy. A medial release was performed and the patellar fat pad was excised. Stryker navigation was used to cut the distal femur perpendicular to the mechanical axis. A freehand patellar resection was performed, and the patella was sized an prepared with 3 lug holes.  Nagivation was used to make a neutral proximal tibia  resection, taking 9 mm of bone from the less affected lateral side with 3 degrees of slope. The menisci were excised. A spacer block was placed, and the alignment and balance in extension were confirmed.   The distal femur was sized using the 3-degree external rotation guide referencing the posterior femoral cortex. The appropriate 4-in-1 cutting block was pinned into place. Rotation was checked using Whiteside's line, the epicondylar axis, and then confirmed with a spacer block in flexion. The remaining femoral cuts were performed, taking care to protect the MCL.  The tibia was sized and the trial tray was pinned into place. The remaining trail components were inserted. The knee was stable to varus and valgus stress through a full range of motion. The patella tracked centrally, and the PCL was well balanced. The trial components were removed, and the proximal tibial surface was prepared. Final components were impacted into place. The knee was tested for a final time and found to be well balanced.  The wound was copiously irrigated with normal saline with pulse lavage. Marcaine solution was injected into the periarticular soft tissue. The wound was closed in layers using #1 Vicryl and Stratafix for the fascia, 2-0 Vicryl for the subcutaneous fat, 2-0 Monocryl for the deep dermal layer, 3-0 running Monocryl subcuticular Stitch, and Dermabond for the skin. Once the glue was fully dried, an Aquacell Ag and compressive dressing were applied. Tthe patient was transported to the recovery room in stable condition. Sponge, needle, and instrument counts were correct at the end of the case x2. The patient tolerated the procedure well and there were no known complications.  Please note that a surgical assistant was a medical necessity for this procedure in order to perform it in a safe and expeditious manner. Surgical assistant was necessary  to retract the ligaments and vital neurovascular structures to prevent  injury to them and also necessary for proper positioning of the limb to allow for anatomic placement of the prosthesis.

## 2017-06-04 NOTE — Interval H&P Note (Signed)
History and Physical Interval Note:  06/04/2017 12:26 PM  [name] [name]  has presented today for surgery, with the diagnosis of Degenerative joint disease left knee  The various methods of treatment have been discussed with the patient and family. After consideration of risks, benefits and other options for treatment, the patient has consented to  Procedure(s) with comments: LEFT TOTAL KNEE ARTHROPLASTY WITH COMPUTER NAVIGATION (Left) - NEEDS RNFA as a surgical intervention .  The patient's history has been reviewed, patient examined, no change in status, stable for surgery.  I have reviewed the patient's chart and labs.  Questions were answered to the patient's satisfaction.     [name]

## 2017-06-04 NOTE — Anesthesia Procedure Notes (Signed)
Spinal  Patient location during procedure: OR Start time: 06/04/2017 12:40 PM End time: 06/04/2017 12:47 PM Staffing Anesthesiologist: Barnet Glasgow, MD Performed: anesthesiologist  Preanesthetic Checklist Completed: patient identified, surgical consent, pre-op evaluation, timeout performed, IV checked, risks and benefits discussed and monitors and equipment checked Spinal Block Patient position: sitting Prep: site prepped and draped and DuraPrep Patient monitoring: heart rate, cardiac monitor, continuous pulse ox and blood pressure Approach: midline Location: L3-4 Injection technique: single-shot Needle Needle type: Pencan  Needle gauge: 24 G Needle length: 10 cm Assessment Sensory level: T4

## 2017-06-05 ENCOUNTER — Encounter (HOSPITAL_COMMUNITY): Payer: Self-pay | Admitting: Orthopedic Surgery

## 2017-06-05 LAB — CBC
HEMATOCRIT: 31.8 % — AB (ref 39.0–52.0)
Hemoglobin: 10.4 g/dL — ABNORMAL LOW (ref 13.0–17.0)
MCH: 30.2 pg (ref 26.0–34.0)
MCHC: 32.7 g/dL (ref 30.0–36.0)
MCV: 92.4 fL (ref 78.0–100.0)
PLATELETS: 174 10*3/uL (ref 150–400)
RBC: 3.44 MIL/uL — AB (ref 4.22–5.81)
RDW: 12.5 % (ref 11.5–15.5)
WBC: 13 10*3/uL — AB (ref 4.0–10.5)

## 2017-06-05 LAB — BASIC METABOLIC PANEL
ANION GAP: 6 (ref 5–15)
BUN: 16 mg/dL (ref 6–20)
CALCIUM: 9 mg/dL (ref 8.9–10.3)
CO2: 27 mmol/L (ref 22–32)
Chloride: 104 mmol/L (ref 101–111)
Creatinine, Ser: 0.81 mg/dL (ref 0.61–1.24)
Glucose, Bld: 121 mg/dL — ABNORMAL HIGH (ref 65–99)
POTASSIUM: 4.4 mmol/L (ref 3.5–5.1)
Sodium: 137 mmol/L (ref 135–145)

## 2017-06-05 MED ORDER — DOCUSATE SODIUM 100 MG PO CAPS: 100.0000 mg | ORAL_CAPSULE | Freq: Two times a day (BID) | ORAL | 1 refills | Status: DC

## 2017-06-05 MED ORDER — ONDANSETRON HCL 4 MG PO TABS: 4.0000 mg | ORAL_TABLET | Freq: Four times a day (QID) | ORAL | 0 refills | Status: DC | PRN

## 2017-06-05 MED ORDER — SENNA 8.6 MG PO TABS: 2.0000 | ORAL_TABLET | Freq: Every day | ORAL | 3 refills | Status: DC

## 2017-06-05 MED ORDER — HYDROCODONE-ACETAMINOPHEN 5-325 MG PO TABS: 1.0000 | ORAL_TABLET | ORAL | 0 refills | Status: DC | PRN

## 2017-06-05 NOTE — Progress Notes (Signed)
    Subjective:  Patient reports pain as mild to moderate.  Denies N/V/CP/SOB.   Objective:   VITALS:   Vitals:   06/04/17 2000 06/04/17 2123 06/04/17 2300 06/05/17 0541  BP: (!) 148/82 (!) 145/65 (!) 144/72 (!) 143/68  Pulse: 60 62 76 66  Resp: 18 18 18 18   Temp: 97.7 F (36.5 C) 97.6 F (36.4 C) 98 F (36.7 C) 98.3 F (36.8 C)  TempSrc: Oral Oral Oral Oral  SpO2: 100% 100% 98% 99%  Weight:      Height:        NAD ABD soft Sensation intact distally Intact pulses distally Dorsiflexion/Plantar flexion intact Incision: dressing C/D/I Compartment soft   Lab Results  Component Value Date   WBC 13.0 (H) 06/05/2017   HGB 10.4 (L) 06/05/2017   HCT 31.8 (L) 06/05/2017   MCV 92.4 06/05/2017   PLT 174 06/05/2017   BMET    Component Value Date/Time   NA 137 06/05/2017 0517   NA 144 02/18/2016   K 4.4 06/05/2017 0517   CL 104 06/05/2017 0517   CO2 27 06/05/2017 0517   GLUCOSE 121 (H) 06/05/2017 0517   BUN 16 06/05/2017 0517   BUN 18 02/18/2016   CREATININE 0.81 06/05/2017 0517   CREATININE 1.01 07/27/2013 0001   CALCIUM 9.0 06/05/2017 0517   GFRNONAA >60 06/05/2017 0517   GFRAA >60 06/05/2017 0517     Assessment/Plan: 1 Day Post-Op   Principal Problem:   Osteoarthritis of left knee   WBAT with walker DVT ppx: xarelto, SCDs, TEDS PO pain control PT/OT Dispo: D/C home tomorrow with outpatient PT - already set up    Bertram Savin 06/05/2017, 9:48 AM   Rod Can, MD Cell [phone]

## 2017-06-05 NOTE — Progress Notes (Signed)
Patient declines the use of nocturnal CPAP. He states he only wears it at home "when my wife makes me" and complains of the mask being "cumbersome". Order changed to prn per RT protocol. He is aware that he may request a machine at any time if he should change his mind and become more compliant.

## 2017-06-05 NOTE — Discharge Summary (Signed)
Physician Discharge Summary  Patient ID: [name] [name] MRN: [id number] DOB/AGE: 1945/11/13 72 y.o.  Admit date: 06/04/2017 Discharge date: 06/06/2017  Admission Diagnoses:  Osteoarthritis of left knee  Discharge Diagnoses:  Principal Problem:   Osteoarthritis of left knee   Past Medical History:  Diagnosis Date  . Alzheimer disease   . Anxiety   . Arthritis   . Atrial fibrillation (H[facility])    Chronic  . Bipolar disorder ([place])   . Chronic low back pain   . Dementia   . Depression   . Dizziness   . Dysrhythmia    a fib  . ED (erectile dysfunction)   . External hemorrhoids   . Fatigue   . HLD (hyperlipidemia)   . HTN (hypertension)   . Hypogonadism in male   . Lipoma of skin   . Memory loss   . Mixed Alzheimer's and vascular dementia   . Poor balance   . Restless legs syndrome (RLS)   . Sleep apnea    cpap  . Stroke ([place]) 1991  . Tremor     Surgeries: Procedure(s): LEFT TOTAL KNEE ARTHROPLASTY WITH COMPUTER NAVIGATION on 06/04/2017   Consultants (if any):   Discharged Condition: Improved  Hospital Course: [name] [name] is an 72 y.o. male who was admitted 06/04/2017 with a diagnosis of Osteoarthritis of left knee and went to the operating room on 06/04/2017 and underwent the above named procedures.    He was given perioperative antibiotics:  Anti-infectives (From admission, onward)   Start     Dose/Rate Route Frequency Ordered Stop   06/04/17 1800  ceFAZolin (ANCEF) IVPB 2g/100 mL premix     2 g 200 mL/hr over 30 Minutes Intravenous Every 6 hours 06/04/17 1713 06/05/17 0038   06/04/17 1100  ceFAZolin (ANCEF) IVPB 2g/100 mL premix     2 g 200 mL/hr over 30 Minutes Intravenous On call to O.R. 06/04/17 1025 06/04/17 1250    .  He was given sequential compression devices, early ambulation, and xarelto for DVT prophylaxis.  He benefited maximally from the hospital stay and there were no complications.    Recent vital signs:  Vitals:   06/05/17 2122 06/06/17 0504   BP: 138/61 (!) 160/71  Pulse: 82 70  Resp: 15 18  Temp: 98.3 F (36.8 C) 98 F (36.7 C)  SpO2: 98% 98%    Recent laboratory studies:  Lab Results  Component Value Date   HGB 10.0 (L) 06/06/2017   HGB 10.4 (L) 06/05/2017   HGB 12.5 (L) 05/19/2017   Lab Results  Component Value Date   WBC 11.2 (H) 06/06/2017   PLT 150 06/06/2017   Lab Results  Component Value Date   INR 1.45 02/10/2016   Lab Results  Component Value Date   NA 137 06/05/2017   K 4.4 06/05/2017   CL 104 06/05/2017   CO2 27 06/05/2017   BUN 16 06/05/2017   CREATININE 0.81 06/05/2017   GLUCOSE 121 (H) 06/05/2017    Discharge Medications:   Allergies as of 06/06/2017      Reactions   Latex Rash, Other (See Comments)   Reaction to knee wraps      Medication List    TAKE these medications   atorvastatin 40 MG tablet Commonly known as:  LIPITOR Take 40 mg by mouth daily.   b complex vitamins tablet Take 1 tablet by mouth daily.   carbidopa-levodopa 25-250 MG tablet Commonly known as:  SINEMET IR Take 1 tablet by mouth  2 (two) times daily.   divalproex 500 MG 24 hr tablet Commonly known as:  DEPAKOTE ER Take 1 tablet (500 mg total) by mouth at bedtime.   docusate sodium 100 MG capsule Commonly known as:  COLACE Take 1 capsule (100 mg total) by mouth 2 (two) times daily.   donepezil 10 MG tablet Commonly known as:  ARICEPT Take 1 tablet (10 mg total) by mouth at bedtime.   fluocinonide gel 0.05 % Commonly known as:  LIDEX Apply 1 application topically 2 (two) times daily.   folic acid 1 MG tablet Commonly known as:  FOLVITE Take 1 mg by mouth at bedtime.   gabapentin 100 MG capsule Commonly known as:  NEURONTIN Take 200 mg by mouth 2 (two) times daily.   HYDROcodone-acetaminophen 5-325 MG tablet Commonly known as:  NORCO/VICODIN Take 1-2 tablets by mouth every 4 (four) hours as needed.   lamoTRIgine 150 MG tablet Commonly known as:  LAMICTAL Take 150 mg by mouth 2 (two) times  daily.   lisinopril 10 MG tablet Commonly known as:  PRINIVIL,ZESTRIL Take 10 mg by mouth daily.   memantine 10 MG tablet Commonly known as:  NAMENDA Take 10 mg by mouth 2 (two) times daily.   metroNIDAZOLE 0.75 % gel Commonly known as:  METROGEL Apply 1 application topically 2 (two) times daily.   ondansetron 4 MG tablet Commonly known as:  ZOFRAN Take 1 tablet (4 mg total) by mouth every 6 (six) hours as needed for nausea.   REPATHA [place] Inject into the skin every 14 (fourteen) days.   rivaroxaban 20 MG Tabs tablet Commonly known as:  XARELTO Take 20 mg by mouth at bedtime.   senna 8.6 MG Tabs tablet Commonly known as:  SENOKOT Take 2 tablets (17.2 mg total) by mouth at bedtime.       Diagnostic Studies: Dg Knee Left Port  Result Date: 06/04/2017 CLINICAL DATA:  Status post left knee arthroplasty. EXAM: PORTABLE LEFT KNEE - 1-2 VIEW COMPARISON:  Radiographs of November 14, 2015. FINDINGS: The femoral and tibial components appear to be well situated. No fracture or dislocation is noted. Vascular calcifications are noted. Expected postoperative changes are seen in the anterior soft tissues. IMPRESSION: Status post left total knee arthroplasty. Electronically Signed   By: Marijo Conception, M.D.   On: 06/04/2017 15:53    Disposition:    Discharge Instructions    Call MD / Call 911   Complete by:  As directed    If you experience chest pain or shortness of breath, CALL 911 and be transported to the hospital emergency room.  If you develope a fever above 101 F, pus (white drainage) or increased drainage or redness at the wound, or calf pain, call your surgeon's office.   Constipation Prevention   Complete by:  As directed    Drink plenty of fluids.  Prune juice may be helpful.  You may use a stool softener, such as Colace (over the counter) 100 mg twice a day.  Use MiraLax (over the counter) for constipation as needed.   Diet - low sodium heart healthy   Complete by:  As  directed    Do not put a pillow under the knee. Place it under the heel.   Complete by:  As directed    Driving restrictions   Complete by:  As directed    No driving for 6 weeks   Increase activity slowly as tolerated   Complete by:  As directed  Lifting restrictions   Complete by:  As directed    No lifting for 6 weeks   TED hose   Complete by:  As directed    Use stockings (TED hose) for 2 weeks on both leg(s).  You may remove them at night for sleeping.      Follow-up Information    Jorah Hua, Aaron Edelman, MD. Schedule an appointment as soon as possible for a visit in 2 weeks.   Specialty:  Orthopedic Surgery Why:  For wound re-check Contact information: [address] [place] [address] [phone]            Signed: Hilton Cork Abdulrahim Siddiqi 06/11/2017, 7:22 AM

## 2017-06-05 NOTE — Progress Notes (Signed)
Physical Therapy Treatment Patient Details Name: DONNIVAN VILLENA MRN: [id number] DOB: 03/31/1945 Today's Date: 06/05/2017    History of Present Illness 72 yo male s/p L TKA 06/04/17. hx of dementia, bipolar d/o, Parkinson's.     PT Comments    Progressing with mobility. Some confusion noted. Cues for safety required. No family present on today.    Follow Up Recommendations  Follow surgeon's recommendation for DC plan and follow-up therapies     Equipment Recommendations  None recommended by PT    Recommendations for Other Services       Precautions / Restrictions Precautions Precautions: Fall Restrictions Weight Bearing Restrictions: No Other Position/Activity Restrictions: WBAT    Mobility  Bed Mobility Overal bed mobility: Needs Assistance Bed Mobility: Supine to Sit;Sit to Supine     Supine to sit: Min guard;HOB elevated Sit to supine: HOB elevated;Min guard   General bed mobility comments: close guard for safety.   Transfers Overall transfer level: Needs assistance Equipment used: Rolling walker (2 wheeled) Transfers: Sit to/from Stand Sit to Stand: Min guard         General transfer comment: close guard for safety. VCs safety, hand placement. Impulsive  Ambulation/Gait Ambulation/Gait assistance: Min assist Ambulation Distance (Feet): 125 Feet Assistive device: Rolling walker (2 wheeled) Gait Pattern/deviations: Step-through pattern;Decreased stride length     General Gait Details: VCS safety, sequence, distance from RW, correct use of RW. Assist to steady intermittently.    Stairs            Wheelchair Mobility    Modified Rankin (Stroke Patients Only)       Balance                                            Cognition Arousal/Alertness: Awake/alert Behavior During Therapy: WFL for tasks assessed/performed Overall Cognitive Status: Within Functional Limits for tasks assessed                                         Exercises Total Joint Exercises Ankle Circles/Pumps: AROM;Both;10 reps;Supine Quad Sets: AAROM;Left;10 reps;Supine Heel Slides: AAROM;Left;10 reps;Supine Straight Leg Raises: AROM;Left;10 reps;Supine Goniometric ROM: ~10-70 degrees    General Comments        Pertinent Vitals/Pain Pain Assessment: Faces Faces Pain Scale: Hurts even more Pain Location: R knee with activity Pain Descriptors / Indicators: Aching;Sore Pain Intervention(s): Monitored during session;Repositioned;Ice applied    Home Living Family/patient expects to be discharged to:: Private residence Living Arrangements: Spouse/significant other Available Help at Discharge: Family;Available 24 hours/day Type of Home: House Home Access: Stairs to enter Entrance Stairs-Rails: None Home Layout: Two level;Bed/bath upstairs Home Equipment: Walker - 2 wheels;Cane - single point;Grab bars - tub/shower Additional Comments: per admission 5 months ago, pt questionable historian    Prior Function Level of Independence: Independent with assistive device(s)      Comments: pt states he uses a cane   PT Goals (current goals can now be found in the care plan section) Acute Rehab PT Goals Patient Stated Goal: none stated PT Goal Formulation: With patient Time For Goal Achievement: 06/19/17 Potential to Achieve Goals: Good Progress towards PT goals: Progressing toward goals    Frequency    7X/week      PT Plan Current plan remains appropriate  Co-evaluation              AM-PAC PT "6 Clicks" Daily Activity  Outcome Measure  Difficulty turning over in bed (including adjusting bedclothes, sheets and blankets)?: A Little Difficulty moving from lying on back to sitting on the side of the bed? : A Little Difficulty sitting down on and standing up from a chair with arms (e.g., wheelchair, bedside commode, etc,.)?: A Little Help needed moving to and from a bed to chair (including a wheelchair)?: A  Little Help needed walking in hospital room?: A Little Help needed climbing 3-5 steps with a railing? : A Little 6 Click Score: 18    End of Session Equipment Utilized During Treatment: Gait belt Activity Tolerance: Patient tolerated treatment well Patient left: in bed;with call bell/phone within reach;with bed alarm set   PT Visit Diagnosis: Pain;Other abnormalities of gait and mobility (R26.89);Difficulty in walking, not elsewhere classified (R26.2) Pain - Right/Left: Left Pain - part of body: Knee     Time: 1400-1409 PT Time Calculation (min) (ACUTE ONLY): 9 min  Charges:  $Gait Training: 8-22 mins                    G Codes:          Weston Anna, MPT Pager: [phone]

## 2017-06-05 NOTE — Plan of Care (Signed)
Plan of care discussed.   

## 2017-06-05 NOTE — Evaluation (Signed)
Physical Therapy Evaluation Patient Details Name: [name] [name] MRN: [id number] DOB: Dec 02, 1945 Today's Date: 06/05/2017   History of Present Illness  72 yo male s/p L TKA 06/04/17. hx of dementia, bipolar d/o, Parkinson's.   Clinical Impression  On eval, pt required Min assist for mobility. He walked ~100 feet with a RW. Moderate pain with activity. Will follow and progress activity as tolerated. Plan is for possible d/c home on tomorrow. Per chart, pt is set up for OP PT.     Follow Up Recommendations Follow surgeon's recommendation for DC plan and follow-up therapies    Equipment Recommendations  None recommended by PT    Recommendations for Other Services       Precautions / Restrictions Precautions Precautions: Fall Restrictions Weight Bearing Restrictions: No Other Position/Activity Restrictions: WBAT      Mobility  Bed Mobility Overal bed mobility: Needs Assistance Bed Mobility: Supine to Sit     Supine to sit: Min guard;HOB elevated     General bed mobility comments: close guard for safety.   Transfers Overall transfer level: Needs assistance Equipment used: Rolling walker (2 wheeled) Transfers: Sit to/from Stand Sit to Stand: Min assist         General transfer comment: Assist to steady. VCs safety, hand placement.   Ambulation/Gait Ambulation/Gait assistance: Min assist Ambulation Distance (Feet): 100 Feet Assistive device: Rolling walker (2 wheeled) Gait Pattern/deviations: Step-to pattern;Step-through pattern;Decreased stride length;Decreased step length - left     General Gait Details: VCS safety, sequence, distance from RW. Assist to steady intermittently.   Stairs            Wheelchair Mobility    Modified Rankin (Stroke Patients Only)       Balance                                             Pertinent Vitals/Pain Pain Assessment: Faces Faces Pain Scale: Hurts even more Pain Location: R knee with  activity Pain Descriptors / Indicators: Aching;Sore Pain Intervention(s): Monitored during session;Repositioned;Premedicated before session    [place] expects to be discharged to:: Private residence Living Arrangements: Spouse/significant other Available Help at Discharge: Family;Available 24 hours/day Type of Home: House Home Access: Stairs to enter Entrance Stairs-Rails: None Entrance Stairs-Number of Steps: 1 Home Layout: Two level;Bed/bath upstairs Home Equipment: Walker - 2 wheels;Cane - single point;Grab bars - tub/shower Additional Comments: per admission 5 months ago, pt questionable historian    Prior Function Level of Independence: Independent with assistive device(s)         Comments: pt states he uses a cane     Hand Dominance        Extremity/Trunk Assessment   Upper Extremity Assessment Upper Extremity Assessment: Overall WFL for tasks assessed    Lower Extremity Assessment Lower Extremity Assessment: Generalized weakness(s/p L TKA)    Cervical / Trunk Assessment Cervical / Trunk Assessment: Normal  Communication   Communication: No difficulties  Cognition Arousal/Alertness: Awake/alert Behavior During Therapy: WFL for tasks assessed/performed Overall Cognitive Status: History of cognitive deficits at baseline.                                         General Comments      Exercises Total Joint Exercises Ankle Circles/Pumps: AROM;Both;10  reps;Supine Quad Sets: AAROM;Left;10 reps;Supine Heel Slides: AAROM;Left;10 reps;Supine Straight Leg Raises: AROM;Left;10 reps;Supine Goniometric ROM: ~10-70 degrees   Assessment/Plan    PT Assessment Patient needs continued PT services  PT Problem List Decreased strength;Decreased range of motion;Decreased mobility;Decreased activity tolerance;Decreased balance;Decreased knowledge of use of DME;Pain;Decreased knowledge of precautions       PT Treatment Interventions  DME instruction;Gait training;Therapeutic activities;Patient/family education;Therapeutic exercise;Balance training;Stair training;Functional mobility training    PT Goals (Current goals can be found in the Care Plan section)  Acute Rehab PT Goals Patient Stated Goal: none stated PT Goal Formulation: With patient Time For Goal Achievement: 06/19/17 Potential to Achieve Goals: Good    Frequency 7X/week   Barriers to discharge        Co-evaluation               AM-PAC PT "6 Clicks" Daily Activity  Outcome Measure Difficulty turning over in bed (including adjusting bedclothes, sheets and blankets)?: A Little Difficulty moving from lying on back to sitting on the side of the bed? : A Little Difficulty sitting down on and standing up from a chair with arms (e.g., wheelchair, bedside commode, etc,.)?: A Little Help needed moving to and from a bed to chair (including a wheelchair)?: A Little Help needed walking in hospital room?: A Little Help needed climbing 3-5 steps with a railing? : A Little 6 Click Score: 18    End of Session Equipment Utilized During Treatment: Gait belt Activity Tolerance: Patient tolerated treatment well Patient left: in chair;with call bell/phone within reach;with chair alarm set   PT Visit Diagnosis: Pain;Other abnormalities of gait and mobility (R26.89);Difficulty in walking, not elsewhere classified (R26.2) Pain - Right/Left: Left Pain - part of body: Knee    Time: 1140-1205 PT Time Calculation (min) (ACUTE ONLY): 25 min   Charges:   PT Evaluation $PT Eval Low Complexity: 1 Low PT Treatments $Gait Training: 8-22 mins   PT G Codes:          Weston Anna, MPT Pager: [phone]

## 2017-06-06 LAB — CBC
HCT: 30.4 % — ABNORMAL LOW (ref 39.0–52.0)
Hemoglobin: 10 g/dL — ABNORMAL LOW (ref 13.0–17.0)
MCH: 30.7 pg (ref 26.0–34.0)
MCHC: 32.9 g/dL (ref 30.0–36.0)
MCV: 93.3 fL (ref 78.0–100.0)
PLATELETS: 150 10*3/uL (ref 150–400)
RBC: 3.26 MIL/uL — AB (ref 4.22–5.81)
RDW: 12.8 % (ref 11.5–15.5)
WBC: 11.2 10*3/uL — AB (ref 4.0–10.5)

## 2017-06-06 NOTE — Progress Notes (Signed)
Occupational Therapy Evaluation Patient Details Name: LYRICK LAGRAND MRN: [id number] DOB: 05/08/1945 Today's Date: 06/06/2017    History of Present Illness 72 yo male s/p L TKA 06/04/17. hx of dementia, bipolar d/o, Parkinson's.    Clinical Impression   Patient presents to OT with decreased independence and safety due to the deficits listed below. He will benefit from OT to maximize function and to facilitate a safe discharge. His cognitive deficits impact safety and ability to perform adequate education, and no family was present during evaluation. OT will follow.    Follow Up Recommendations  Supervision/Assistance - 24 hour;Follow surgeon's recommendation for DC plan and follow-up therapies    Equipment Recommendations  None recommended by OT    Recommendations for Other Services       Precautions / Restrictions Precautions Precautions: Fall Restrictions Weight Bearing Restrictions: No Other Position/Activity Restrictions: WBAT      Mobility Bed Mobility             Transfers                 Balance                                           ADL either performed or assessed with clinical judgement   ADL Overall ADL's : Needs assistance/impaired                                       General ADL Comments: Patient reports he got up to bathroom with nursing staff earlier. He is not a good historian, and refused OOB during this evaluation. He reports he has assistance at home from his wife and son but neither were present today. He was unable to tell me the date except "2018" and said he was at [facility]". Did not recall refusing PT this morning. Recommend 24/7 A at discharge.     Vision         Perception     Praxis      Pertinent Vitals/Pain Pain Assessment: 0-10 Pain Score: 5  Pain Location: left knee with activity Pain Descriptors / Indicators: Aching;Sore Pain Intervention(s): Limited activity within  patient's tolerance;Monitored during session;Ice applied     Hand Dominance     Extremity/Trunk Assessment Upper Extremity Assessment Upper Extremity Assessment: Overall WFL for tasks assessed   Lower Extremity Assessment Lower Extremity Assessment: Defer to PT evaluation   Cervical / Trunk Assessment Cervical / Trunk Assessment: Normal   Communication Communication Communication: No difficulties   Cognition Arousal/Alertness: Awake/alert Behavior During Therapy: WFL for tasks assessed/performed Overall Cognitive Status: Impaired/Different from baseline Area of Impairment: Memory                     Memory: Decreased recall of precautions;Decreased short-term memory         General Comments: requires multi-modal cues for funcitonal tasks, pt is noncompliant with knee precautions   General Comments       Exercises    Shoulder Instructions      Home Living Family/patient expects to be discharged to:: Private residence Living Arrangements: Spouse/significant other Available Help at Discharge: Family;Available 24 hours/day Type of Home: House Home Access: Stairs to enter [employer] of Steps: 1 Entrance Stairs-Rails: None Home Layout: Two level;Able to live  on main level with bedroom/bathroom;1/2 bath on main level Alternate Level Stairs-Number of Steps: 1 flight   Bathroom Shower/Tub: Occupational psychologist: Standard Bathroom Accessibility: Yes How Accessible: Accessible via walker Home Equipment: [place] - 2 wheels;Cane - single point;Grab bars - tub/shower;Bedside commode   Additional Comments: per admission 5 months ago, pt questionable historian      Prior Functioning/Environment Level of Independence: Independent with assistive device(s)        Comments: pt states he uses a cane        OT Problem List: Decreased strength;Decreased range of motion;Decreased cognition;Decreased safety awareness;Decreased knowledge of use  of DME or AE;Pain      OT Treatment/Interventions: Self-care/ADL training;DME and/or AE instruction;Therapeutic activities;Patient/family education    OT Goals(Current goals can be found in the care plan section) Acute Rehab OT Goals Patient Stated Goal: none stated OT Goal Formulation: Patient unable to participate in goal setting Time For Goal Achievement: 06/20/17 Potential to Achieve Goals: Good  OT Frequency: Min 2X/week   Barriers to D/C:            Co-evaluation              AM-PAC PT "6 Clicks" Daily Activity     Outcome Measure Help from another person eating meals?: None Help from another person taking care of personal grooming?: A Little Help from another person toileting, which includes using toliet, bedpan, or urinal?: A Lot Help from another person bathing (including washing, rinsing, drying)?: A Lot Help from another person to put on and taking off regular upper body clothing?: A Little Help from another person to put on and taking off regular lower body clothing?: A Lot 6 Click Score: 16   End of Session    Activity Tolerance: Other (comment)(pt is self-limiting) Patient left: in bed;with call bell/phone within reach;with bed alarm set  OT Visit Diagnosis: Unsteadiness on feet (R26.81);Other symptoms and signs involving cognitive function                Time: 8242-3536 OT Time Calculation (min): 20 min Charges:  OT General Charges $OT Visit: 1 Visit OT Evaluation $OT Eval Low Complexity: 1 Low G-Codes:       Rivky Clendenning A Chianti Goh 2017/06/17, 2:05 PM

## 2017-06-06 NOTE — Progress Notes (Signed)
Physical Therapy Treatment Patient Details Name: [name] [name] MRN: [id number] DOB: 1945/03/21 Today's Date: 06/06/2017    History of Present Illness 72 yo male s/p L TKA 06/04/17. hx of dementia, bipolar d/o, Parkinson's.     PT Comments    Pt progressing although he is somewhat noncompliant with knee precautions and PT advice; d/t  decr STM, does not recall previous events of today or having had pain meds;  Would recommend HHPT and near 24hr supervision at home for safety as pt is mildly impulsive; from mobility standpoint pt can d/c with wife's assist; wife not present today; pt is motivated to d/c   Follow Up Recommendations  Follow surgeon's recommendation for DC plan and follow-up therapies;Supervision for mobility/OOB(pt is unsafe, would recommend HHPT)     Equipment Recommendations  None recommended by PT    Recommendations for Other Services       Precautions / Restrictions Precautions Precautions: Fall;Knee Restrictions Weight Bearing Restrictions: No Other Position/Activity Restrictions: WBAT    Mobility  Bed Mobility Overal bed mobility: Needs Assistance Bed Mobility: Supine to Sit;Sit to Supine     Supine to sit: Min guard;HOB elevated Sit to supine: HOB elevated;Min guard   General bed mobility comments: close guard for safety.   Transfers Overall transfer level: Needs assistance Equipment used: Rolling walker (2 wheeled) Transfers: Sit to/from Stand Sit to Stand: Min guard         General transfer comment: close guard for safety. VCs safety, hand placement. Impulsive  Ambulation/Gait Ambulation/Gait assistance: Min guard Ambulation Distance (Feet): 75 Feet Assistive device: Rolling walker (2 wheeled) Gait Pattern/deviations: Step-to pattern;Step-through pattern;Decreased stride length;Decreased weight shift to left     General Gait Details: VCS safety, sequence, distance from RW, correct use of RW. Assist to steady intermittently. multi-modal  cues to incr wt on LLE and to extend knee in stance   Stairs Stairs: Yes   Stair Management: No rails;Step to pattern;Backwards;With walker Number of Stairs: 1 General stair comments: cues for sequence and safe technique, min/guard to steady during RW transition from one surface to next  Wheelchair Mobility    Modified Rankin (Stroke Patients Only)       Balance                                            Cognition Arousal/Alertness: Awake/alert Behavior During Therapy: WFL for tasks assessed/performed Overall Cognitive Status: Impaired/Different from baseline Area of Impairment: Memory                     Memory: Decreased recall of precautions;Decreased short-term memory         General Comments: requires multi-modal cues for funcitonal tasks, pt is noncompliant with knee precautions      Exercises Total Joint Exercises Ankle Circles/Pumps: AROM;Both;10 reps;Supine Quad Sets: (given HEP for home, pt wanted to finish lunch rather than ex)    General Comments        Pertinent Vitals/Pain Pain Assessment: 0-10 Pain Score: 4  Pain Location: left knee with activity Pain Descriptors / Indicators: Aching;Sore Pain Intervention(s): Monitored during session;Premedicated before session;Ice applied    Home Living Family/patient expects to be discharged to:: Private residence Living Arrangements: Spouse/significant other Available Help at Discharge: Family;Available 24 hours/day Type of Home: House Home Access: Stairs to enter Entrance Stairs-Rails: None Home Layout: Two level;Able to live on main  level with bedroom/bathroom;1/2 bath on main level Home Equipment: Walker - 2 wheels;Cane - single point;Grab bars - tub/shower;Bedside commode Additional Comments: per admission 5 months ago, pt questionable historian    Prior Function Level of Independence: Independent with assistive device(s)      Comments: pt states he uses a cane   PT  Goals (current goals can now be found in the care plan section) Acute Rehab PT Goals PT Goal Formulation: With patient Time For Goal Achievement: 06/19/17 Potential to Achieve Goals: Good Progress towards PT goals: Progressing toward goals    Frequency    7X/week      PT Plan Current plan remains appropriate    Co-evaluation              AM-PAC PT "6 Clicks" Daily Activity  Outcome Measure  Difficulty turning over in bed (including adjusting bedclothes, sheets and blankets)?: A Little Difficulty moving from lying on back to sitting on the side of the bed? : A Little Difficulty sitting down on and standing up from a chair with arms (e.g., wheelchair, bedside commode, etc,.)?: A Little Help needed moving to and from a bed to chair (including a wheelchair)?: A Little Help needed walking in hospital room?: A Little Help needed climbing 3-5 steps with a railing? : A Little 6 Click Score: 18    End of Session Equipment Utilized During Treatment: Gait belt Activity Tolerance: Patient tolerated treatment well Patient left: in bed;with call bell/phone within reach;with bed alarm set   PT Visit Diagnosis: Pain;Other abnormalities of gait and mobility (R26.89);Difficulty in walking, not elsewhere classified (R26.2) Pain - Right/Left: Left Pain - part of body: Knee     Time: 1250-1306 PT Time Calculation (min) (ACUTE ONLY): 16 min  Charges:  $Gait Training: 8-22 mins                    G CodesKenyon Ana, PT Pager: [phone] 06/06/2017    Kenyon Ana 06/06/2017, 1:26 PM

## 2017-06-06 NOTE — Progress Notes (Signed)
PT Note  Patient Details Name: [name] [name] MRN: [id number] DOB: 29-May-1945      Checked on pt this morning, pt stated he wants to wait for therapy, we wants to take a nap stating he had a bad night last night. He requested I come back a late as I can, I stated he is discharging today so I will check on pt around 11:00 or 11:30.    Clide Dales 06/06/2017, 9:13 AM  Clide Dales, PT Pager: [phone] 06/06/2017

## 2017-06-06 NOTE — Progress Notes (Signed)
    Subjective:  Patient reports pain as mild to moderate.  Denies N/V/CP/SOB.   Objective:   VITALS:   Vitals:   06/05/17 0952 06/05/17 1333 06/05/17 2122 06/06/17 0504  BP: 128/60 139/68 138/61 (!) 160/71  Pulse: 70 81 82 70  Resp: 16 16 15 18   Temp: 98.3 F (36.8 C) 97.7 F (36.5 C) 98.3 F (36.8 C) 98 F (36.7 C)  TempSrc: Oral Oral Oral Oral  SpO2: 100% 98% 98% 98%  Weight:      Height:        NAD ABD soft Sensation intact distally Intact pulses distally Dorsiflexion/Plantar flexion intact Incision: dressing C/D/I Compartment soft   Lab Results  Component Value Date   WBC 11.2 (H) 06/06/2017   HGB 10.0 (L) 06/06/2017   HCT 30.4 (L) 06/06/2017   MCV 93.3 06/06/2017   PLT 150 06/06/2017   BMET    Component Value Date/Time   NA 137 06/05/2017 0517   NA 144 02/18/2016   K 4.4 06/05/2017 0517   CL 104 06/05/2017 0517   CO2 27 06/05/2017 0517   GLUCOSE 121 (H) 06/05/2017 0517   BUN 16 06/05/2017 0517   BUN 18 02/18/2016   CREATININE 0.81 06/05/2017 0517   CREATININE 1.01 07/27/2013 0001   CALCIUM 9.0 06/05/2017 0517   GFRNONAA >60 06/05/2017 0517   GFRAA >60 06/05/2017 0517     Assessment/Plan: 2 Days Post-Op   Principal Problem:   Osteoarthritis of left knee   WBAT with walker DVT ppx: xarelto, SCDs, TEDS PO pain control PT/OT Dispo: D/C home with outpatient PT - already set up    [name] [name] 06/06/2017, 8:19 AM   Rod Can, MD Cell [phone]

## 2017-06-07 NOTE — Progress Notes (Signed)
06/06/2017 Pt was arranged for outpt PT and he has RW at home. Verified with surgeon. Notified [facility]. Jonnie Finner RN CCM Case Mgmt phone [phone]

## 2018-03-04 IMAGING — MR MR HEAD W/O CM
9 of 10 series · 36 of 48 positions shown · non-contrast
Comparison: Head CT 02/10/2016 and MRI 03/17/2013

CLINICAL DATA: Ataxia, nystagmus, and diplopia. Symptoms for 3
days.

EXAM:
MRI HEAD WITHOUT CONTRAST
TECHNIQUE: Multiplanar, multiecho pulse sequences of the brain and surrounding
structures were obtained without intravenous contrast.

[Series 3: DWI · axial · 3.0mm · 1.09mm/px · z∈[+20,+162]mm · 9 of 98 slices shown (1 of 4)]
[im 1/98]
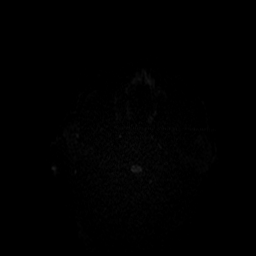
[im 13/98]
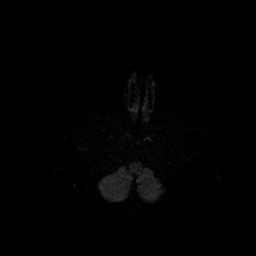
[im 25/98]
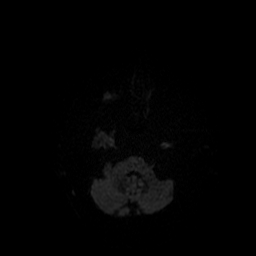
[im 37/98]
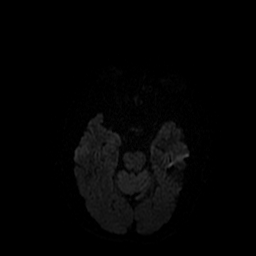
[im 49/98]
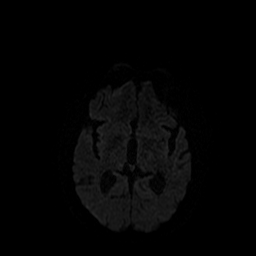
[im 61/98]
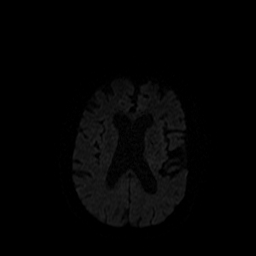
[im 73/98]
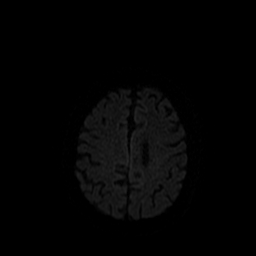
[im 85/98]
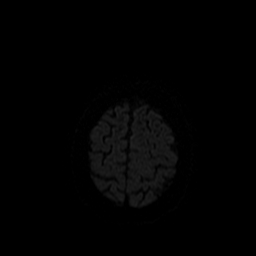
[im 98/98]
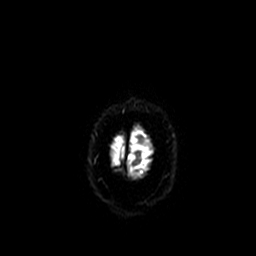

[Series 4: DWI · coronal · 5.0mm · 1.09mm/px · 7 of 66 slices shown (2 of 4)]
[im 1/66]
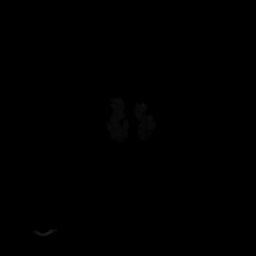
[im 11/66]
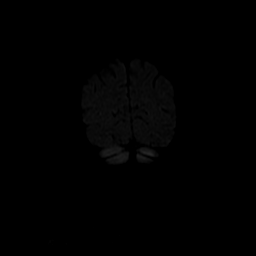
[im 22/66]
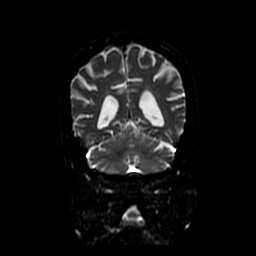
[im 33/66]
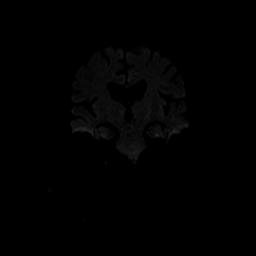
[im 44/66]
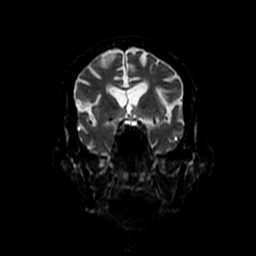
[im 55/66]
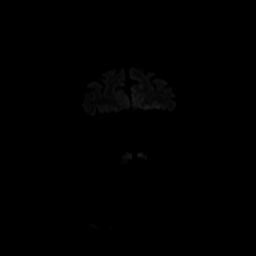
[im 66/66]
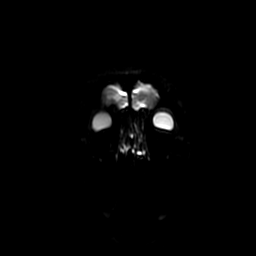

[Series 5: T1 · sagittal · 5.0mm · 0.47mm/px · 2 of 23 slices shown]
[im 1/23]
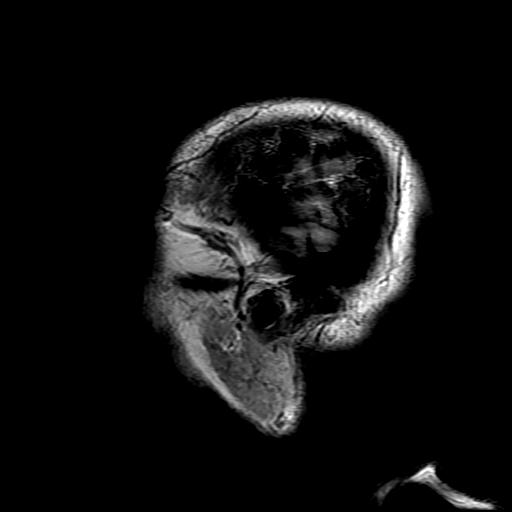
[im 23/23]
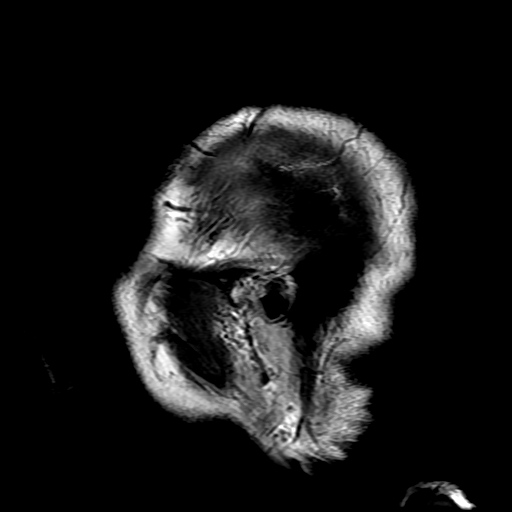

[Series 6: T2 · axial · 5.0mm · 0.47mm/px · z∈[+11,+153]mm · 3 of 25 slices shown (1 of 2)]
[im 1/25]
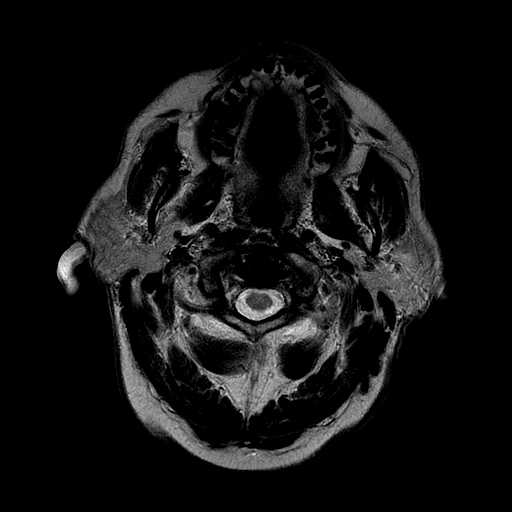
[im 13/25]
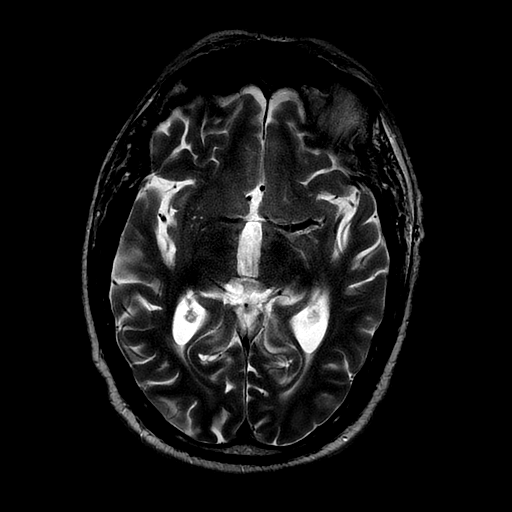
[im 25/25]
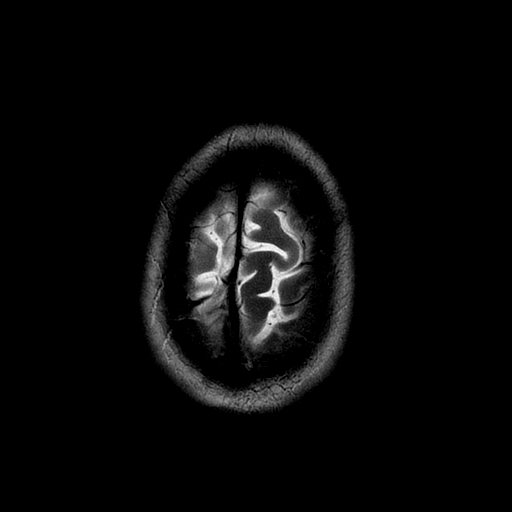

[Series 8: FLAIR · axial · 5.0mm · 0.47mm/px · z∈[+11,+153]mm · 3 of 25 slices shown]
[im 1/25]
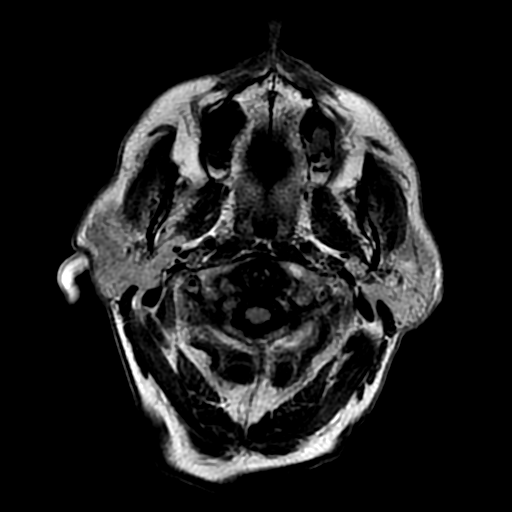
[im 13/25]
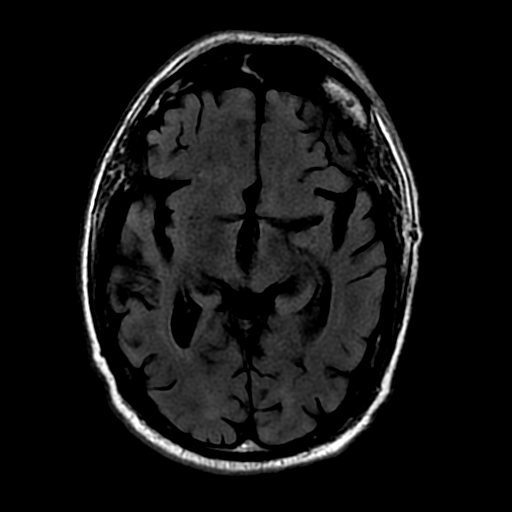
[im 25/25]
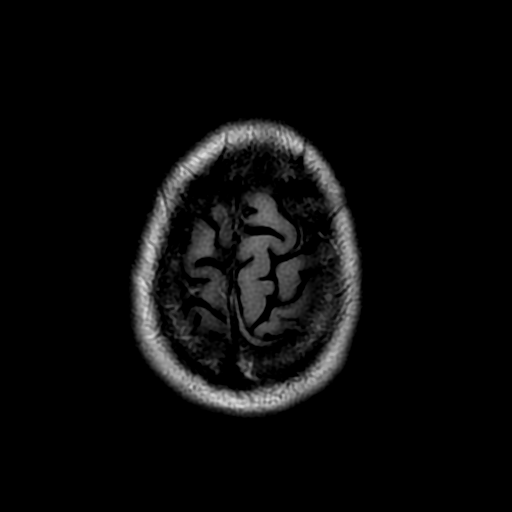

[Series 9: ax mpgr · axial · 5.0mm · 0.47mm/px · 1 of 25 slices shown]
[im 1/25]
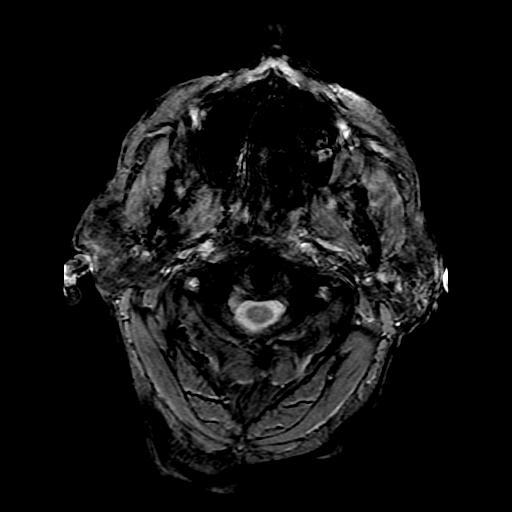

[Series 11: T2 · coronal · 5.0mm · 0.43mm/px · 3 of 28 slices shown (2 of 2)]
[im 1/28]
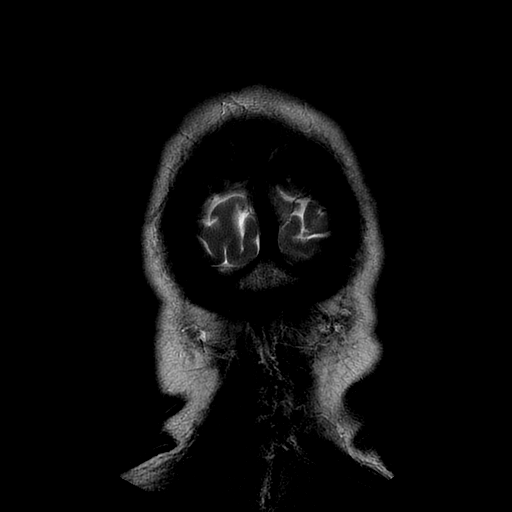
[im 14/28]
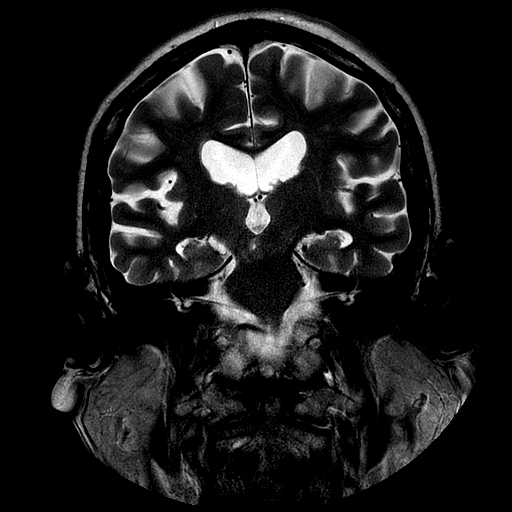
[im 28/28]
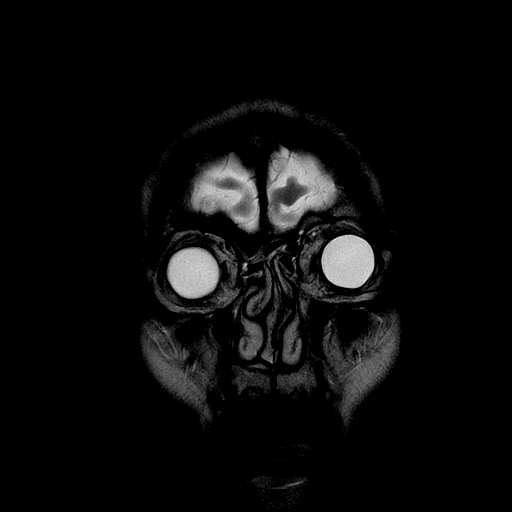

[Series 300: DWI · axial · 3.0mm · 1.09mm/px · z∈[+20,+162]mm · 5 of 49 slices shown (3 of 4)]
[im 1/49]
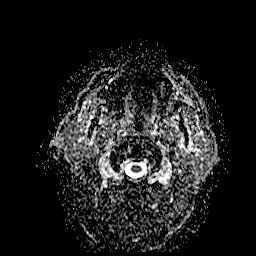
[im 13/49]
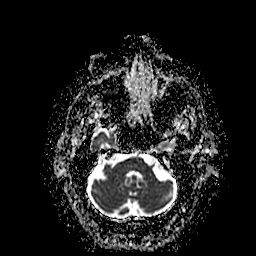
[im 25/49]
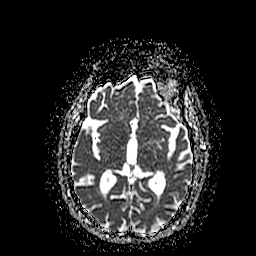
[im 37/49]
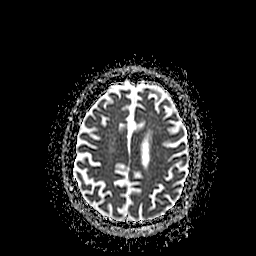
[im 49/49]
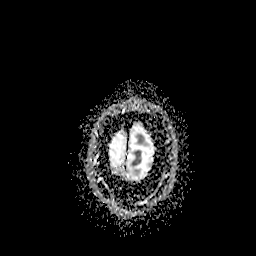

[Series 400: DWI · coronal · 5.0mm · 1.09mm/px · 3 of 33 slices shown (4 of 4)]
[im 1/33]
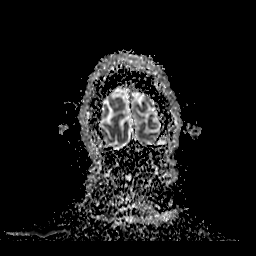
[im 17/33]
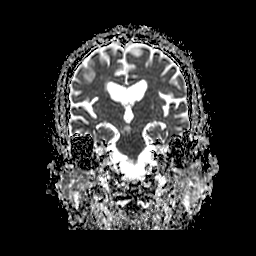
[im 33/33]
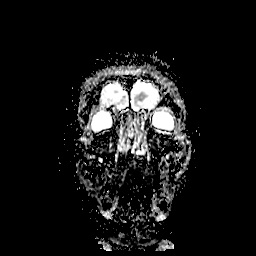

[36 of 48 positions shown; findings below may reference images not displayed]

FINDINGS: Brain: There is no evidence of acute infarct, intracranial
hemorrhage, mass, midline shift, or extra-axial fluid collection.
There is mild cerebral atrophy. Scattered T2 hyperintensities in the
subcortical and deep cerebral white matter are unchanged from the
prior MRI and nonspecific but compatible with mild chronic small
vessel ischemic disease. Chronic infarct involving the right
midbrain is again noted with unchanged volume loss.

Vascular: Major intracranial vascular flow voids are preserved.

Skull and upper cervical spine: Unremarkable bone marrow signal.

Sinuses/Orbits: Unremarkable orbits. Left maxillary sinus mucous
retention cyst. Clear mastoid air cells.

Other: None.
IMPRESSION: 1. No acute intracranial abnormality.
2. Mild chronic small vessel ischemic disease.

## 2018-03-04 IMAGING — CT CT HEAD CODE STROKE
3 of 4 series · 16 of 47 positions shown, 19 images · non-contrast
Comparison: Brain MRI and CT 03/17/2013

CLINICAL DATA: Code stroke.  Slurred speech.

EXAM:
CT HEAD WITHOUT CONTRAST
TECHNIQUE: Contiguous axial images were obtained from the base of the skull
through the vertex without intravenous contrast.

[Series 201: head w/o, idose (1) · axial · non-contrast · 0.46mm/px · z∈[+141,+281]mm · 10 of 34 slices shown, 13 images]
[im 3/34  brain]
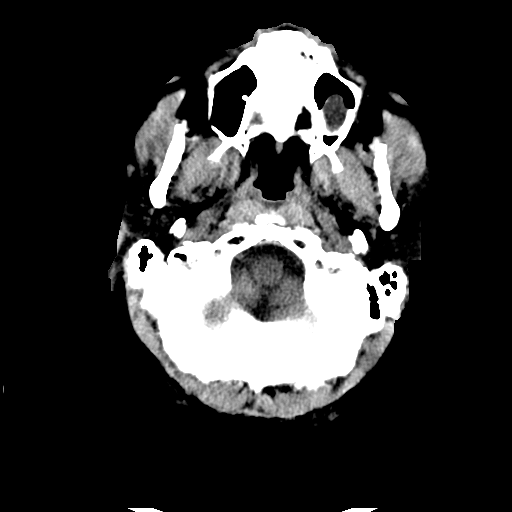
[im 3/34  bone]
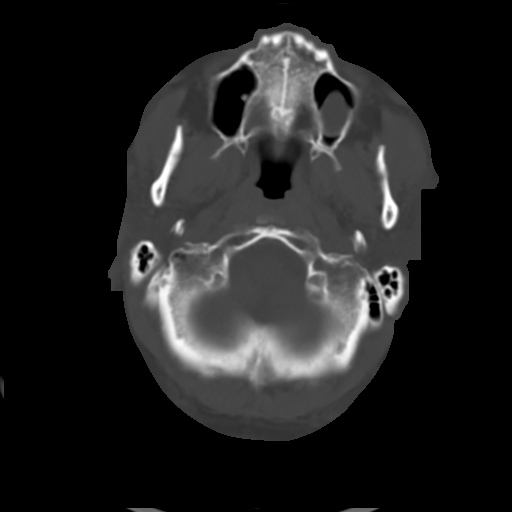
[im 5/34  brain]
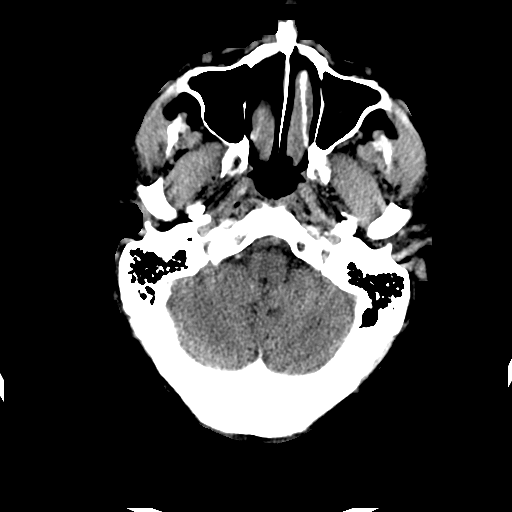
[im 10/34  brain]
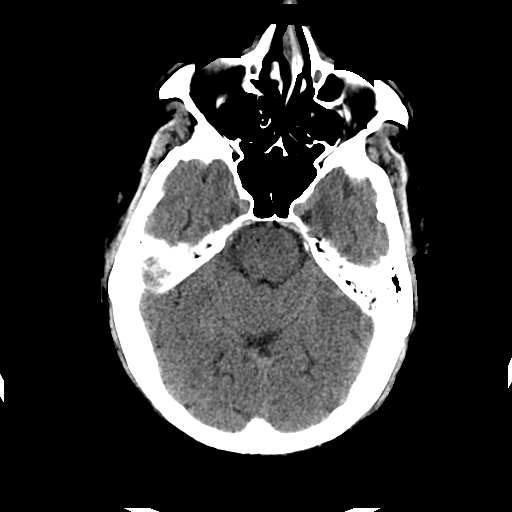
[im 12/34  brain]
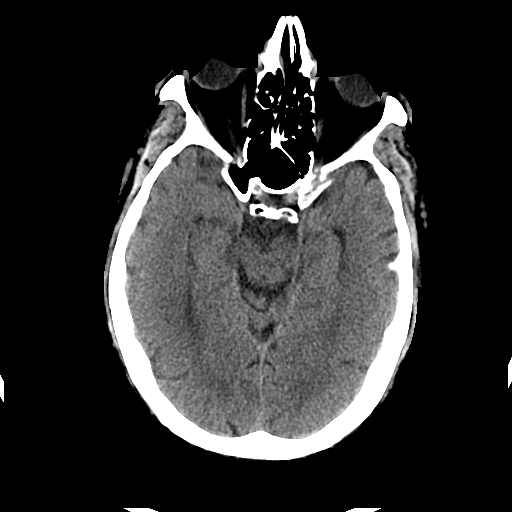
[im 15/34  brain]
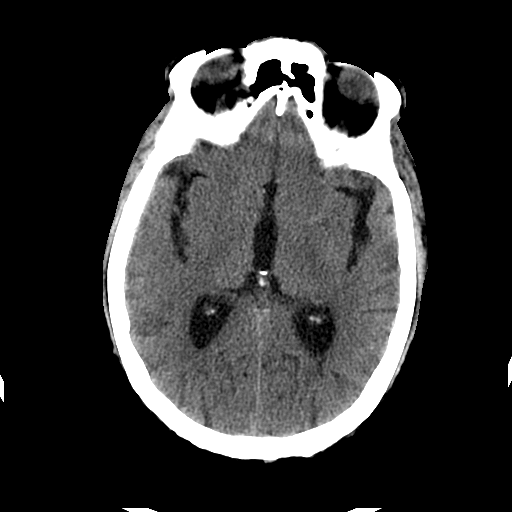
[im 15/34  bone]
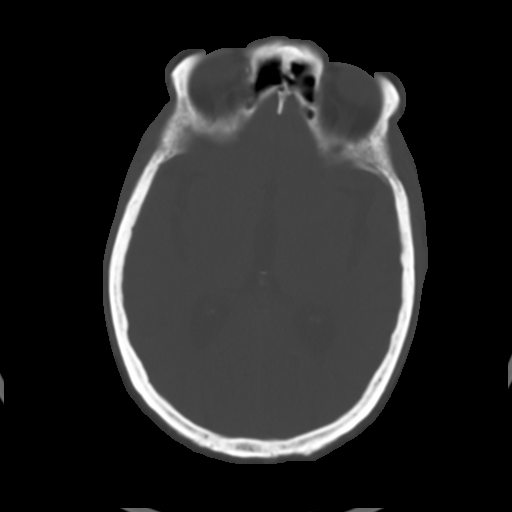
[im 19/34  brain]
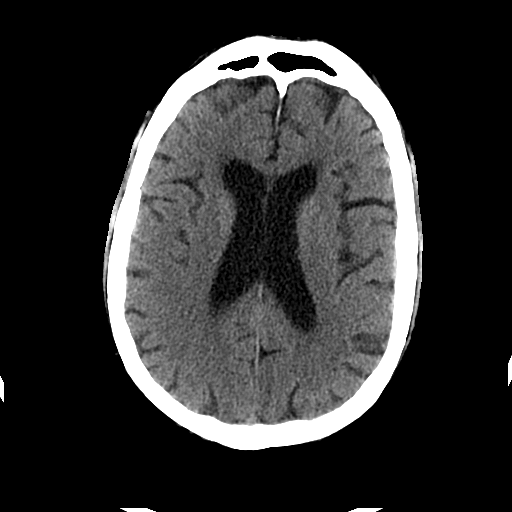
[im 22/34  brain]
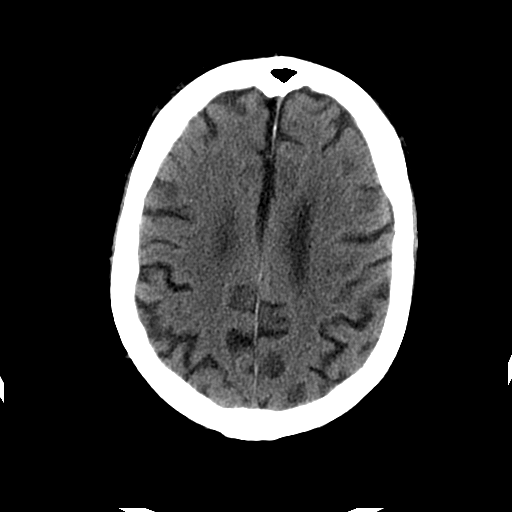
[im 24/34  brain]
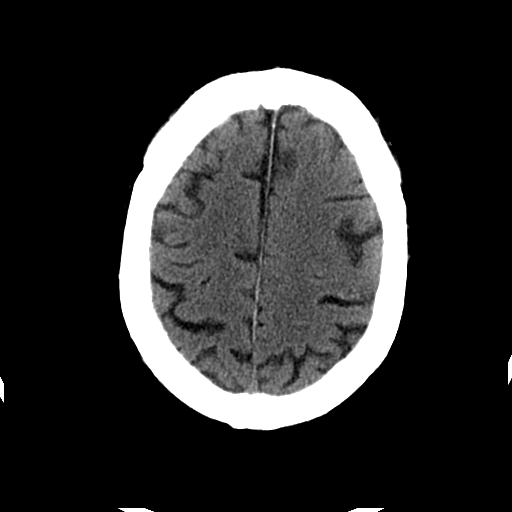
[im 29/34  brain]
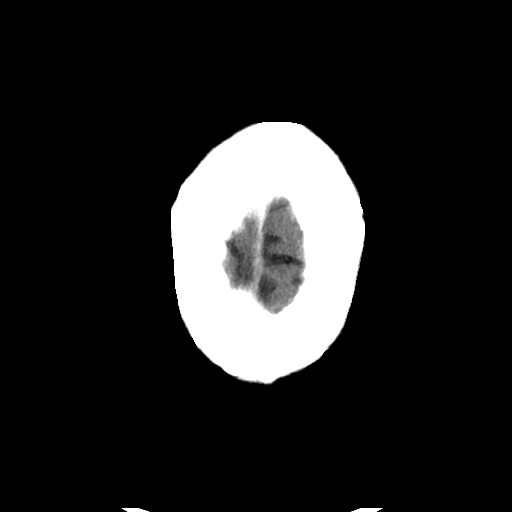
[im 29/34  bone]
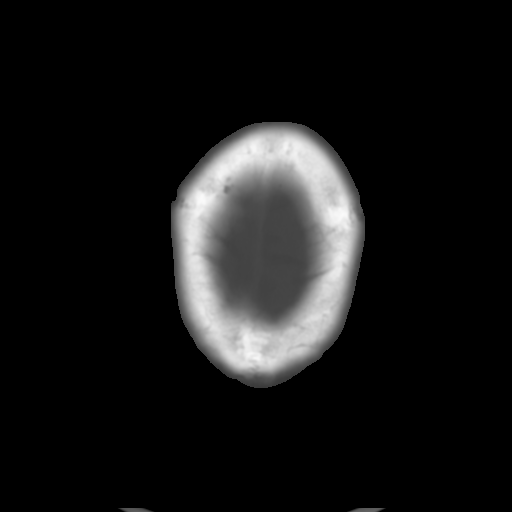
[im 31/34  brain]
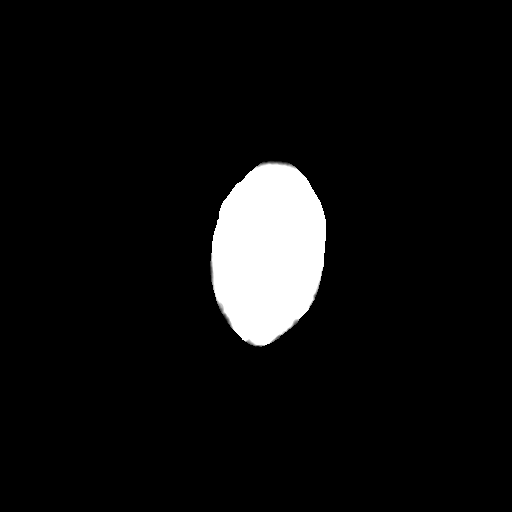

[Series 203: coronal st, idose (1) · coronal · 0.40mm/px · 3 of 78 slices shown]
[im 26/78  brain]
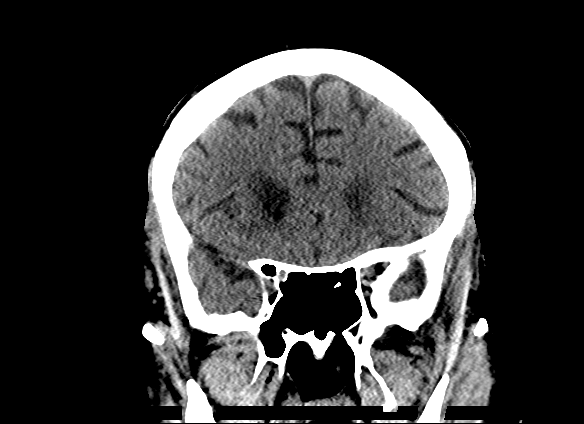
[im 35/78  brain]
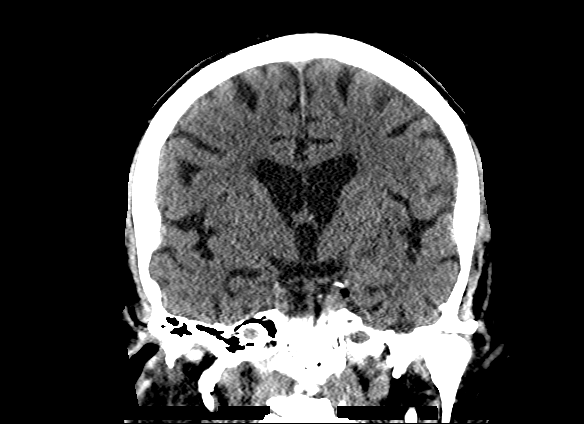
[im 43/78  brain]
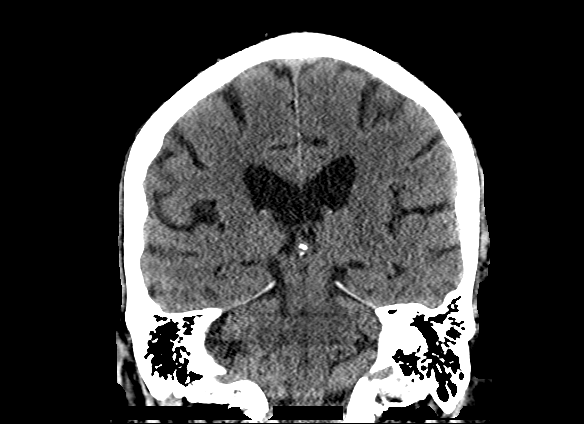

[Series 204: sagittal st, idose (1) · sagittal · 0.40mm/px · 3 of 75 slices shown]
[im 25/75  brain]
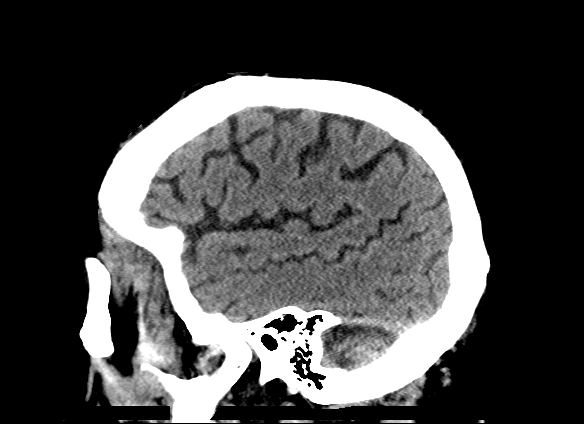
[im 38/75  brain]
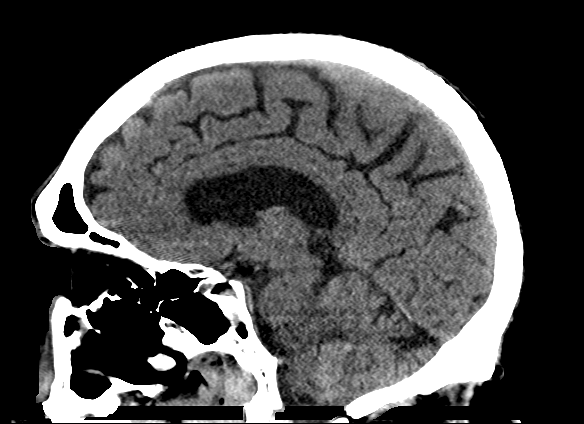
[im 50/75  brain]
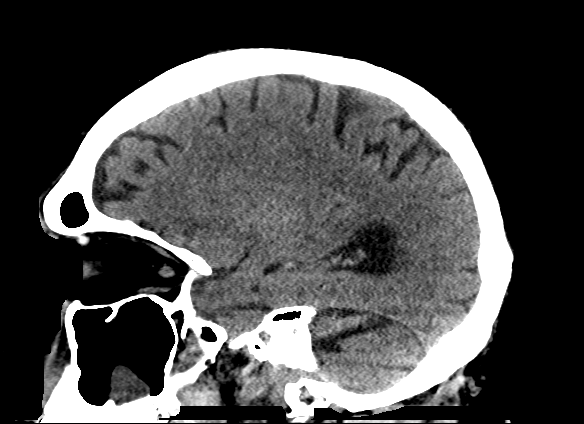

[16 of 47 positions shown; findings below may reference images not displayed]

FINDINGS: Brain: There is no evidence of acute cortical infarct, intracranial
hemorrhage, mass, midline shift, or extra-axial fluid collection.
There is mild cerebral atrophy. Volume loss in the right cerebral
peduncle is unchanged and correlates to the previously described
remote infarct. Cerebral white matter hypodensities are nonspecific
but compatible with mild chronic small vessel ischemic disease.

Vascular: No hyperdense vessel or unexpected calcification.

Skull: No fracture or focal osseous lesion.

Sinuses/Orbits: Left maxillary sinus mucous retention cyst. Clear
mastoid air cells. Unremarkable orbits.

Other: None.

ASPECTS (Alberta Stroke Program Early CT Score)

- Ganglionic level infarction (caudate, lentiform nuclei, internal
capsule, insula, M1-M3 cortex): 7

- Supraganglionic infarction (M4-M6 cortex): 3

Total score (0-10 with 10 being normal): 10
IMPRESSION: 1. No evidence of acute intracranial abnormality.
2. ASPECTS is 10.
3. Mild chronic small vessel ischemic disease.
These results were called by telephone at the time of interpretation
on 02/10/2016 at [DATE] to Dr. Bajran, who verbally
acknowledged these results.

## 2018-12-16 DIAGNOSIS — Z96659 Presence of unspecified artificial knee joint: Secondary | ICD-10-CM | POA: Insufficient documentation

## 2019-03-30 DIAGNOSIS — E538 Deficiency of other specified B group vitamins: Secondary | ICD-10-CM | POA: Diagnosis not present

## 2019-03-30 DIAGNOSIS — E038 Other specified hypothyroidism: Secondary | ICD-10-CM | POA: Diagnosis not present

## 2019-03-30 DIAGNOSIS — R262 Difficulty in walking, not elsewhere classified: Secondary | ICD-10-CM | POA: Diagnosis not present

## 2019-03-30 DIAGNOSIS — G2581 Restless legs syndrome: Secondary | ICD-10-CM | POA: Diagnosis not present

## 2019-03-30 DIAGNOSIS — R251 Tremor, unspecified: Secondary | ICD-10-CM | POA: Diagnosis not present

## 2019-03-30 DIAGNOSIS — Q998 Other specified chromosome abnormalities: Secondary | ICD-10-CM | POA: Diagnosis not present

## 2019-03-30 DIAGNOSIS — Z79899 Other long term (current) drug therapy: Secondary | ICD-10-CM | POA: Diagnosis not present

## 2019-03-30 DIAGNOSIS — E519 Thiamine deficiency, unspecified: Secondary | ICD-10-CM | POA: Diagnosis not present

## 2019-03-30 DIAGNOSIS — R413 Other amnesia: Secondary | ICD-10-CM | POA: Diagnosis not present

## 2019-03-30 DIAGNOSIS — H532 Diplopia: Secondary | ICD-10-CM | POA: Diagnosis not present

## 2019-03-30 DIAGNOSIS — E559 Vitamin D deficiency, unspecified: Secondary | ICD-10-CM | POA: Diagnosis not present

## 2019-03-31 ENCOUNTER — Ambulatory Visit: Payer: Medicare Other

## 2019-04-07 ENCOUNTER — Ambulatory Visit: Payer: Medicare PPO | Attending: Internal Medicine

## 2019-04-07 DIAGNOSIS — Z23 Encounter for immunization: Secondary | ICD-10-CM

## 2019-04-07 NOTE — Progress Notes (Signed)
   Covid-19 Vaccination Clinic  Name:  [name] [name]    MRN: [id number] DOB: [birth year]  04/07/2019  [name] was observed post Covid-19 immunization for 15 minutes without incidence. He was provided with Vaccine Information Sheet and instruction to access the V-Safe system.   [name] was instructed to call 911 with any severe reactions post vaccine: Marland Kitchen Difficulty breathing  . Swelling of your face and throat  . A fast heartbeat  . A bad rash all over your body  . Dizziness and weakness    Immunizations Administered    Name Date Dose VIS Date Route   Pfizer COVID-19 Vaccine 04/07/2019  1:04 PM 0.3 mL 02/11/2019 Intramuscular   Manufacturer: [place]   Lot: [id number]   [place]: [id number]

## 2019-04-11 ENCOUNTER — Telehealth: Payer: Self-pay | Admitting: Internal Medicine

## 2019-04-11 NOTE — Telephone Encounter (Signed)
[facility] Palliative visit scheduled for 04-27-19 at 1:00

## 2019-04-21 ENCOUNTER — Ambulatory Visit: Payer: Medicare Other

## 2019-04-27 ENCOUNTER — Other Ambulatory Visit: Payer: Self-pay

## 2019-04-27 ENCOUNTER — Other Ambulatory Visit: Payer: Medicare PPO | Admitting: Internal Medicine

## 2019-05-02 ENCOUNTER — Ambulatory Visit: Payer: Medicare PPO | Attending: Internal Medicine

## 2019-05-02 DIAGNOSIS — Z23 Encounter for immunization: Secondary | ICD-10-CM | POA: Insufficient documentation

## 2019-05-02 NOTE — Progress Notes (Signed)
   Covid-19 Vaccination Clinic  Name:  [name] [name]    MRN: [id number] DOB: [birth year]  05/02/2019  [name] was observed post Covid-19 immunization for 15 minutes without incidence. He was provided with Vaccine Information Sheet and instruction to access the V-Safe system.   [name] was instructed to call 911 with any severe reactions post vaccine: Marland Kitchen Difficulty breathing  . Swelling of your face and throat  . A fast heartbeat  . A bad rash all over your body  . Dizziness and weakness    Immunizations Administered    Name Date Dose VIS Date Route   Pfizer COVID-19 Vaccine 05/02/2019  4:27 PM 0.3 mL 02/11/2019 Intramuscular   Manufacturer: [place]   Lot: [id number]   [place]: [id number]

## 2019-05-24 DIAGNOSIS — G4733 Obstructive sleep apnea (adult) (pediatric): Secondary | ICD-10-CM | POA: Diagnosis not present

## 2019-05-24 DIAGNOSIS — Z9989 Dependence on other enabling machines and devices: Secondary | ICD-10-CM | POA: Diagnosis not present

## 2019-05-25 ENCOUNTER — Telehealth: Payer: Self-pay | Admitting: Internal Medicine

## 2019-05-25 NOTE — Telephone Encounter (Signed)
Rec'd call from daughter Tressia Miners wanting to reschedule the 05/31/19 Palliative f/u visit, this was rescheduled for 06/14/19 @ 1 PM

## 2019-06-13 ENCOUNTER — Telehealth: Payer: Self-pay | Admitting: Internal Medicine

## 2019-06-14 ENCOUNTER — Other Ambulatory Visit: Payer: Medicare PPO | Admitting: Internal Medicine

## 2019-06-14 DIAGNOSIS — G4733 Obstructive sleep apnea (adult) (pediatric): Secondary | ICD-10-CM | POA: Diagnosis not present

## 2019-06-14 NOTE — Telephone Encounter (Signed)
Rec'd call from patient's daughter Tressia Miners, needing to reschedule the 06/14/19 Palliative f/u visit.  I gave her a couple of dates/times and she is going to speak with her brother to see which works for him and she will call me back.

## 2019-06-14 NOTE — Telephone Encounter (Signed)
Rec'd call back from daughter Tressia Miners and we have rescheduled the 06/14/19 Palliative f/u visit to 06/24/19.

## 2019-06-22 DIAGNOSIS — L718 Other rosacea: Secondary | ICD-10-CM | POA: Diagnosis not present

## 2019-06-22 DIAGNOSIS — L814 Other melanin hyperpigmentation: Secondary | ICD-10-CM | POA: Diagnosis not present

## 2019-06-22 DIAGNOSIS — D225 Melanocytic nevi of trunk: Secondary | ICD-10-CM | POA: Diagnosis not present

## 2019-06-22 DIAGNOSIS — L720 Epidermal cyst: Secondary | ICD-10-CM | POA: Diagnosis not present

## 2019-06-22 DIAGNOSIS — D171 Benign lipomatous neoplasm of skin and subcutaneous tissue of trunk: Secondary | ICD-10-CM | POA: Diagnosis not present

## 2019-06-22 DIAGNOSIS — L821 Other seborrheic keratosis: Secondary | ICD-10-CM | POA: Diagnosis not present

## 2019-06-22 DIAGNOSIS — D1801 Hemangioma of skin and subcutaneous tissue: Secondary | ICD-10-CM | POA: Diagnosis not present

## 2019-06-22 DIAGNOSIS — L570 Actinic keratosis: Secondary | ICD-10-CM | POA: Diagnosis not present

## 2019-06-24 ENCOUNTER — Other Ambulatory Visit: Payer: Medicare PPO | Admitting: Internal Medicine

## 2019-06-24 ENCOUNTER — Other Ambulatory Visit: Payer: Self-pay

## 2019-06-24 DIAGNOSIS — F0280 Dementia in other diseases classified elsewhere without behavioral disturbance: Secondary | ICD-10-CM | POA: Diagnosis not present

## 2019-06-24 DIAGNOSIS — Z515 Encounter for palliative care: Secondary | ICD-10-CM

## 2019-06-24 DIAGNOSIS — F0150 Vascular dementia without behavioral disturbance: Secondary | ICD-10-CM

## 2019-06-24 DIAGNOSIS — G309 Alzheimer's disease, unspecified: Secondary | ICD-10-CM | POA: Diagnosis not present

## 2019-06-27 NOTE — Progress Notes (Signed)
[facility] Consult Note Telephone: [phone]  Fax: [phone]  PATIENT NAME: [name] [name] DOB: 1945-05-10 MRN: [id number]  PRIMARY CARE PROVIDER:   Deland Pretty, MD  REFERRING PROVIDER:  Deland Pretty, MD [place],  [place] [address]  RESPONSIBLE PARTY:   Self, wife, son and daughter      RECOMMENDATIONS and PLAN:  Palliative care encounter Z51.5  1.  Advance care planning:  Previous discussion related to advanced directives.  Full scope of treatment selected.  Goals remain unchanged and are to focus on longevity of life at home. Son and daughter will continue to assist in the care of patient.  Palliative care will continue to f/u with patient.   2.  Vascular dementia without behaviors.  FAST stage 6a with progression of increased daytime sleeping. Complete home sleep study to renew CPAP.  Hopeful that beginning use of CPAP will decrease daytime sleepiness.  Continue to monitor  I spent 35 minutes providing this consultation,  from 1500 to 1535. More than 50% of the time in this consultation was spent coordinating communication with patient, wife, son and daughter.Marland Kitchen   HISTORY OF PRESENT ILLNESS:Followup with Brett Albino Scaduto .  No reports of acute illnesses or injuries.  Family reports that he still has a good appetite and is able to perform his own ADLs.  He walks 2 times per day for exercise.  Palliative Care was asked to help address goals of care.   CODE STATUS: FULL CODE  PPS: 60% HOSPICE ELIGIBILITY/DIAGNOSIS: TBD  PAST MEDICAL HISTORY:  Past Medical History:  Diagnosis Date  . Alzheimer disease   . Anxiety   . Arthritis   . Atrial fibrillation (HCC)    Chronic  . Bipolar disorder ([place])   . Chronic low back pain   . Dementia   . Depression   . Dizziness   . Dysrhythmia    a fib  . ED (erectile dysfunction)   . External hemorrhoids   . Fatigue   . HLD (hyperlipidemia)   . HTN  (hypertension)   . Hypogonadism in male   . Lipoma of skin   . Memory loss   . Mixed Alzheimer's and vascular dementia   . Poor balance   . Restless legs syndrome (RLS)   . Sleep apnea    cpap  . Stroke ([place]) 1991  . Tremor     SOCIAL HX:  Social History   Tobacco Use  . Smoking status: Never Smoker  . Smokeless tobacco: Former Network engineer Use Topics  . Alcohol use: Yes    Alcohol/week: 1.0 standard drinks    Types: 1 Shots of liquor per week    Comment: occasional   PERTINENT MEDICATIONS:  Outpatient Encounter Medications as of 06/24/2019  Medication Sig  . atorvastatin (LIPITOR) 40 MG tablet Take 40 mg by mouth daily.   Marland Kitchen b complex vitamins tablet Take 1 tablet by mouth daily.  . carbidopa-levodopa (SINEMET IR) 25-250 MG per tablet Take 1 tablet by mouth 2 (two) times daily.   . divalproex (DEPAKOTE ER) 500 MG 24 hr tablet Take 1 tablet (500 mg total) by mouth at bedtime.  . docusate sodium (COLACE) 100 MG capsule Take 1 capsule (100 mg total) by mouth 2 (two) times daily.  Marland Kitchen donepezil (ARICEPT) 10 MG tablet Take 1 tablet (10 mg total) by mouth at bedtime.  . Evolocumab (REPATHA [place]) Inject into the skin every 14 (fourteen) days.  Marland Kitchen  fluocinonide gel (LIDEX) [id number] % Apply 1 application topically 2 (two) times daily.  . folic acid (FOLVITE) 1 MG tablet Take 1 mg by mouth at bedtime.   . gabapentin (NEURONTIN) 100 MG capsule Take 200 mg by mouth 2 (two) times daily.  Marland Kitchen HYDROcodone-acetaminophen (NORCO/VICODIN) 5-325 MG tablet Take 1-2 tablets by mouth every 4 (four) hours as needed.  . lamoTRIgine (LAMICTAL) 150 MG tablet Take 150 mg by mouth 2 (two) times daily.  Marland Kitchen lisinopril (PRINIVIL,ZESTRIL) 10 MG tablet Take 10 mg by mouth daily.   . memantine (NAMENDA) 10 MG tablet Take 10 mg by mouth 2 (two) times daily.   . metroNIDAZOLE (METROGEL) 0.75 % gel Apply 1 application topically 2 (two) times daily.  . ondansetron (ZOFRAN) 4 MG tablet Take 1 tablet (4 mg total) by mouth  every 6 (six) hours as needed for nausea.  . rivaroxaban (XARELTO) 20 MG TABS tablet Take 20 mg by mouth at bedtime.  . senna (SENOKOT) 8.6 MG TABS tablet Take 2 tablets (17.2 mg total) by mouth at bedtime.   No facility-administered encounter medications on file as of 06/24/2019.    PHYSICAL EXAM:   General: NAD, well nourished elderly male Cardiovascular: regular rate and rhythm Pulmonary: clear throughout Abdomen: soft, nontender, + bowel sounds Extremities: no edema, no joint deformities Skin: exposed skin is intact Neurological: Somnolent and arouses easily.  Napping intermittently throughout visit in the home. Oriented to person and place. Psych:  Calm and cooperative  Gonzella Lex, NP-C

## 2019-06-30 DIAGNOSIS — G4733 Obstructive sleep apnea (adult) (pediatric): Secondary | ICD-10-CM | POA: Diagnosis not present

## 2019-07-26 DIAGNOSIS — I10 Essential (primary) hypertension: Secondary | ICD-10-CM | POA: Diagnosis not present

## 2019-07-26 DIAGNOSIS — G4733 Obstructive sleep apnea (adult) (pediatric): Secondary | ICD-10-CM | POA: Diagnosis not present

## 2019-07-27 ENCOUNTER — Telehealth: Payer: Self-pay | Admitting: Internal Medicine

## 2019-07-27 NOTE — Telephone Encounter (Signed)
Rec'd message that daughter called and needed to reschedule the Palliative visit scheduled for 08/02/19.  Called daughter with no answer - left my name and contact number.

## 2019-07-29 ENCOUNTER — Telehealth: Payer: Self-pay | Admitting: Internal Medicine

## 2019-07-29 NOTE — Telephone Encounter (Signed)
Rec'd call back from daughter [name] [name] to reschedule the Palliative f/u visit.  She was unable to reschedule the visit to work with her schedule and she requested that I call her brother, [name] [name] - who lives with patient, to reschedule the visit.  11:35 AM:  Heide Scales and it went straight to voicemail, left message with reason for call along with my contact information.

## 2019-07-29 NOTE — Telephone Encounter (Signed)
Called daughter back to see about rescheduling the Palliative f/u visit for 08/02/19, no answer.  Left message with my contact information.

## 2019-08-02 ENCOUNTER — Other Ambulatory Visit: Payer: Medicare PPO | Admitting: Internal Medicine

## 2019-08-02 ENCOUNTER — Other Ambulatory Visit: Payer: Self-pay

## 2019-08-26 DIAGNOSIS — G4733 Obstructive sleep apnea (adult) (pediatric): Secondary | ICD-10-CM | POA: Diagnosis not present

## 2019-09-20 DIAGNOSIS — I10 Essential (primary) hypertension: Secondary | ICD-10-CM | POA: Diagnosis not present

## 2019-09-25 DIAGNOSIS — G4733 Obstructive sleep apnea (adult) (pediatric): Secondary | ICD-10-CM | POA: Diagnosis not present

## 2019-10-26 DIAGNOSIS — G4733 Obstructive sleep apnea (adult) (pediatric): Secondary | ICD-10-CM | POA: Diagnosis not present

## 2019-11-08 DIAGNOSIS — G2581 Restless legs syndrome: Secondary | ICD-10-CM | POA: Diagnosis not present

## 2019-11-08 DIAGNOSIS — R262 Difficulty in walking, not elsewhere classified: Secondary | ICD-10-CM | POA: Diagnosis not present

## 2019-11-08 DIAGNOSIS — H532 Diplopia: Secondary | ICD-10-CM | POA: Diagnosis not present

## 2019-11-08 DIAGNOSIS — Z8679 Personal history of other diseases of the circulatory system: Secondary | ICD-10-CM | POA: Diagnosis not present

## 2019-11-08 DIAGNOSIS — R413 Other amnesia: Secondary | ICD-10-CM | POA: Diagnosis not present

## 2019-11-08 DIAGNOSIS — R251 Tremor, unspecified: Secondary | ICD-10-CM | POA: Diagnosis not present

## 2019-11-26 DIAGNOSIS — G4733 Obstructive sleep apnea (adult) (pediatric): Secondary | ICD-10-CM | POA: Diagnosis not present

## 2019-12-01 ENCOUNTER — Ambulatory Visit (INDEPENDENT_AMBULATORY_CARE_PROVIDER_SITE_OTHER): Payer: Medicare PPO | Admitting: Cardiovascular Disease

## 2019-12-01 ENCOUNTER — Encounter: Payer: Self-pay | Admitting: Cardiovascular Disease

## 2019-12-01 ENCOUNTER — Other Ambulatory Visit: Payer: Self-pay

## 2019-12-01 VITALS — BP 124/62 | HR 71 | Ht 70.0 in | Wt 204.0 lb

## 2019-12-01 DIAGNOSIS — I10 Essential (primary) hypertension: Secondary | ICD-10-CM

## 2019-12-01 DIAGNOSIS — E782 Mixed hyperlipidemia: Secondary | ICD-10-CM | POA: Diagnosis not present

## 2019-12-01 DIAGNOSIS — I482 Chronic atrial fibrillation, unspecified: Secondary | ICD-10-CM | POA: Diagnosis not present

## 2019-12-01 NOTE — Assessment & Plan Note (Addendum)
History of hyperlipidemia on Repatha as well as atorvastatin followed by his PCP.

## 2019-12-01 NOTE — Patient Instructions (Signed)
Medication Instructions:  No Changes In Medications at this time.  *If you need a refill on your cardiac medications before your next appointment, please call your pharmacy*  Lab Work: None Ordered At This Time.  If you have labs (blood work) drawn today and your tests are completely normal, you will receive your results only by: MyChart Message (if you have MyChart) OR A paper copy in the mail If you have any lab test that is abnormal or we need to change your treatment, we will call you to review the results.  Testing/Procedures: None Ordered At This Time.   Follow-Up: At [facility], you and your health needs are our priority.  As part of our continuing mission to provide you with exceptional heart care, we have created designated Provider Care Teams.  These Care Teams include your primary Cardiologist (physician) and Advanced Practice Providers (APPs -  Physician Assistants and Nurse Practitioners) who all work together to provide you with the care you need, when you need it.  We recommend signing up for the patient portal called "MyChart".  Sign up information is provided on this After Visit Summary.  MyChart is used to connect with patients for Virtual Visits (Telemedicine).  Patients are able to view lab/test results, encounter notes, upcoming appointments, etc.  Non-urgent messages can be sent to your provider as well.   To learn more about what you can do with MyChart, go to [email or web address].    Your next appointment:   1 year(s)  The format for your next appointment:   In Person  Provider:   [name] Berry, MD            

## 2019-12-01 NOTE — Progress Notes (Signed)
12/01/2019 [name] [name]   [birth year]  [id number]  Primary Physician [name] Pretty, MD Primary Cardiologist: [name] Harp MD [name] [name], [name]  HPI:  [name] [name] is a 74 y.o.  married Caucasian male father of 56, grandfather and one grandchild is accompanied by his wife [name] [name] who is also a patient of mine. He is also accompanied by one of his sons [name] [name] who is his caretaker and whom he and his wife live with. I last saw him in the office 04/21/2017 he has a history of hypertension and hyperlipidemia as well as chronic atrial fibrillation. I initially saw him back in 1991 when he had a stroke and placed him on Coumadin. He was recently transitioned to Xarelto. He has a history of dementia over the last several years as well as bipolar disorder.  His dementia has apparently progressed over the years.  He has never had a heart attack. He denies chest pain or shortness of breath. He had an uncomplicated total hip replacement recently and left total knee replacement after I saw him.  Since I saw him 2 and half years ago his dementia has progressed.  He is otherwise asymptomatic.  Denies chest pain or shortness of breath.  Current Meds  Medication Sig  . atorvastatin (LIPITOR) 40 MG tablet Take 40 mg by mouth daily.   Marland Kitchen b complex vitamins tablet Take 1 tablet by mouth daily.  . carbidopa-levodopa (SINEMET IR) 25-250 MG per tablet Take 1 tablet by mouth 2 (two) times daily.   . divalproex (DEPAKOTE) 125 MG DR tablet Take 1 tablet by mouth daily. Take the night tab along with 250 mg to equal 375 mg at night 11/08/2019 12/08/2019 [place] 11/08/2019              Take the night tab along with 250 mg to equal 375 mg at night 11/08/2019 12/08/2019 [place] 11/08/2019  . divalproex (DEPAKOTE) 250 MG DR tablet Take 1 tablet by mouth daily.  Marland Kitchen docusate sodium (COLACE) 100 MG capsule Take 1 capsule (100 mg total) by mouth 2 (two) times daily.  Marland Kitchen  donepezil (ARICEPT) 10 MG tablet Take 1 tablet (10 mg total) by mouth at bedtime.  . Evolocumab (REPATHA [place]) Inject into the skin every 14 (fourteen) days.  . fluocinonide gel (LIDEX) 0.03 % Apply 1 application topically 2 (two) times daily.  . folic acid (FOLVITE) 1 MG tablet Take 1 mg by mouth at bedtime.   . gabapentin (NEURONTIN) 100 MG capsule Take 200 mg by mouth 2 (two) times daily.  Marland Kitchen gabapentin (NEURONTIN) 300 MG capsule Take 1 capsule by mouth 2 (two) times daily.  Marland Kitchen HYDROcodone-acetaminophen (NORCO/VICODIN) 5-325 MG tablet Take 1-2 tablets by mouth every 4 (four) hours as needed.  . lamoTRIgine (LAMICTAL) 150 MG tablet Take 150 mg by mouth 2 (two) times daily.  Marland Kitchen lisinopril (PRINIVIL,ZESTRIL) 10 MG tablet Take 10 mg by mouth daily.   . memantine (NAMENDA) 10 MG tablet Take 10 mg by mouth 2 (two) times daily.   . metroNIDAZOLE (METROGEL) 0.75 % gel Apply 1 application topically 2 (two) times daily.  . ondansetron (ZOFRAN) 4 MG tablet Take 1 tablet (4 mg total) by mouth every 6 (six) hours as needed for nausea.  . rivaroxaban (XARELTO) 20 MG TABS tablet Take 20 mg by mouth at bedtime.  . senna (SENOKOT) 8.6 MG TABS tablet Take 2 tablets (17.2 mg total) by mouth at bedtime.  . [  DISCONTINUED] divalproex (DEPAKOTE ER) 500 MG 24 hr tablet Take 1 tablet (500 mg total) by mouth at bedtime.     Allergies  Allergen Reactions  . Latex Rash and Other (See Comments)    Reaction to knee wraps    Social History   Socioeconomic History  . Marital status: Married    Spouse name: Not on file  . Number of children: 3  . Years of education: Not on file  . Highest education level: Not on file  Occupational History  . Occupation: retired  Tobacco Use  . Smoking status: Never Smoker  . Smokeless tobacco: Former Network engineer  . Vaping Use: Never used  Substance and Sexual Activity  . Alcohol use: Yes    Alcohol/week: 1.0 standard drink    Types: 1 Shots of liquor per week    Comment:  occasional  . Drug use: No  . Sexual activity: Yes  Other Topics Concern  . Not on file  Social History Narrative  . Not on file   Social Determinants of Health   Financial Resource Strain:   . Difficulty of Paying Living Expenses: Not on file  Food Insecurity:   . Worried About Charity fundraiser in the Last Year: Not on file  . Ran Out of Food in the Last Year: Not on file  Transportation Needs:   . Lack of Transportation (Medical): Not on file  . Lack of Transportation (Non-Medical): Not on file  Physical Activity:   . Days of Exercise per Week: Not on file  . Minutes of Exercise per Session: Not on file  Stress:   . Feeling of Stress : Not on file  Social Connections:   . Frequency of Communication with Friends and Family: Not on file  . Frequency of Social Gatherings with Friends and Family: Not on file  . Attends Religious Services: Not on file  . Active Member of Clubs or Organizations: Not on file  . Attends Archivist Meetings: Not on file  . Marital Status: Not on file  Intimate Partner Violence:   . Fear of Current or Ex-Partner: Not on file  . Emotionally Abused: Not on file  . Physically Abused: Not on file  . Sexually Abused: Not on file     Review of Systems: General: negative for chills, fever, night sweats or weight changes.  Cardiovascular: negative for chest pain, dyspnea on exertion, edema, orthopnea, palpitations, paroxysmal nocturnal dyspnea or shortness of breath Dermatological: negative for rash Respiratory: negative for cough or wheezing Urologic: negative for hematuria Abdominal: negative for nausea, vomiting, diarrhea, bright red blood per rectum, melena, or hematemesis Neurologic: negative for visual changes, syncope, or dizziness All other systems reviewed and are otherwise negative except as noted above.    Blood pressure 124/62, pulse 71, height 5\' 10"  (1.778 m), weight 204 lb (92.5 kg), SpO2 96 %.  General appearance: alert  and no distress Neck: no adenopathy, no carotid bruit, no JVD, supple, symmetrical, trachea midline and thyroid not enlarged, symmetric, no tenderness/mass/nodules Lungs: clear to auscultation bilaterally Heart: irregularly irregular rhythm Extremities: extremities normal, atraumatic, no cyanosis or edema Pulses: 2+ and symmetric Skin: Skin color, texture, turgor normal. No rashes or lesions Neurologic: Alert and oriented X 3, normal strength and tone. Normal symmetric reflexes. Normal coordination and gait  EKG atrial fibrillation with a ventricular sponsor 71, rightward axis.  I personally reviewed this EKG.  ASSESSMENT AND PLAN:   Hyperlipidemia History of hyperlipidemia on Repatha  as well as atorvastatin followed by his PCP.  Chronic atrial fibrillation (HCC) History of chronic A. fib rate controlled on Xarelto oral anticoagulation.  Essential hypertension History of essential hypertension blood pressure measured today 124/62.  He is on lisinopril.      [name] Harp MD FACP,FACC,FAHA, [facility] 12/01/2019 2:34 PM

## 2019-12-01 NOTE — Assessment & Plan Note (Signed)
History of chronic A. fib rate controlled on Xarelto oral anticoagulation. 

## 2019-12-01 NOTE — Assessment & Plan Note (Signed)
History of essential hypertension blood pressure measured today 124/62.  He is on lisinopril.

## 2019-12-09 DIAGNOSIS — I4891 Unspecified atrial fibrillation: Secondary | ICD-10-CM | POA: Diagnosis not present

## 2019-12-09 DIAGNOSIS — Z8673 Personal history of transient ischemic attack (TIA), and cerebral infarction without residual deficits: Secondary | ICD-10-CM | POA: Diagnosis not present

## 2019-12-09 DIAGNOSIS — Z23 Encounter for immunization: Secondary | ICD-10-CM | POA: Diagnosis not present

## 2019-12-09 DIAGNOSIS — I10 Essential (primary) hypertension: Secondary | ICD-10-CM | POA: Diagnosis not present

## 2019-12-19 DIAGNOSIS — E7800 Pure hypercholesterolemia, unspecified: Secondary | ICD-10-CM | POA: Diagnosis not present

## 2019-12-19 DIAGNOSIS — Z125 Encounter for screening for malignant neoplasm of prostate: Secondary | ICD-10-CM | POA: Diagnosis not present

## 2019-12-19 DIAGNOSIS — I10 Essential (primary) hypertension: Secondary | ICD-10-CM | POA: Diagnosis not present

## 2019-12-20 DIAGNOSIS — F319 Bipolar disorder, unspecified: Secondary | ICD-10-CM | POA: Diagnosis not present

## 2019-12-20 DIAGNOSIS — Z9989 Dependence on other enabling machines and devices: Secondary | ICD-10-CM | POA: Diagnosis not present

## 2019-12-20 DIAGNOSIS — I10 Essential (primary) hypertension: Secondary | ICD-10-CM | POA: Diagnosis not present

## 2019-12-20 DIAGNOSIS — Z1212 Encounter for screening for malignant neoplasm of rectum: Secondary | ICD-10-CM | POA: Diagnosis not present

## 2019-12-20 DIAGNOSIS — E7800 Pure hypercholesterolemia, unspecified: Secondary | ICD-10-CM | POA: Diagnosis not present

## 2019-12-20 DIAGNOSIS — Z0001 Encounter for general adult medical examination with abnormal findings: Secondary | ICD-10-CM | POA: Diagnosis not present

## 2019-12-20 DIAGNOSIS — I4891 Unspecified atrial fibrillation: Secondary | ICD-10-CM | POA: Diagnosis not present

## 2019-12-20 DIAGNOSIS — D696 Thrombocytopenia, unspecified: Secondary | ICD-10-CM | POA: Diagnosis not present

## 2019-12-20 DIAGNOSIS — E039 Hypothyroidism, unspecified: Secondary | ICD-10-CM | POA: Diagnosis not present

## 2019-12-20 DIAGNOSIS — F0280 Dementia in other diseases classified elsewhere without behavioral disturbance: Secondary | ICD-10-CM | POA: Diagnosis not present

## 2019-12-26 DIAGNOSIS — G4733 Obstructive sleep apnea (adult) (pediatric): Secondary | ICD-10-CM | POA: Diagnosis not present

## 2019-12-31 ENCOUNTER — Ambulatory Visit: Payer: Medicare PPO | Attending: Internal Medicine

## 2019-12-31 ENCOUNTER — Other Ambulatory Visit: Payer: Self-pay

## 2019-12-31 DIAGNOSIS — Z23 Encounter for immunization: Secondary | ICD-10-CM

## 2019-12-31 NOTE — Progress Notes (Signed)
   Covid-19 Vaccination Clinic  Name:  SHAWNTA ZIMBELMAN    MRN: [id number] DOB: 1945/09/12  12/31/2019  Mr. Brouillet was observed post Covid-19 immunization for 15 minutes without incident. He was provided with Vaccine Information Sheet and instruction to access the V-Safe system.   Mr. Schroader was instructed to call 911 with any severe reactions post vaccine: Marland Kitchen Difficulty breathing  . Swelling of face and throat  . A fast heartbeat  . A bad rash all over body  . Dizziness and weakness

## 2020-01-06 ENCOUNTER — Ambulatory Visit: Payer: Medicare PPO | Admitting: Cardiovascular Disease

## 2020-01-26 DIAGNOSIS — G4733 Obstructive sleep apnea (adult) (pediatric): Secondary | ICD-10-CM | POA: Diagnosis not present

## 2020-02-07 DIAGNOSIS — R251 Tremor, unspecified: Secondary | ICD-10-CM | POA: Diagnosis not present

## 2020-02-07 DIAGNOSIS — Z8669 Personal history of other diseases of the nervous system and sense organs: Secondary | ICD-10-CM | POA: Diagnosis not present

## 2020-02-07 DIAGNOSIS — R262 Difficulty in walking, not elsewhere classified: Secondary | ICD-10-CM | POA: Diagnosis not present

## 2020-02-07 DIAGNOSIS — R413 Other amnesia: Secondary | ICD-10-CM | POA: Diagnosis not present

## 2020-02-07 DIAGNOSIS — Z8679 Personal history of other diseases of the circulatory system: Secondary | ICD-10-CM | POA: Diagnosis not present

## 2020-02-07 DIAGNOSIS — H532 Diplopia: Secondary | ICD-10-CM | POA: Diagnosis not present

## 2020-02-07 DIAGNOSIS — F0150 Vascular dementia without behavioral disturbance: Secondary | ICD-10-CM | POA: Diagnosis not present

## 2020-02-07 DIAGNOSIS — F0280 Dementia in other diseases classified elsewhere without behavioral disturbance: Secondary | ICD-10-CM | POA: Diagnosis not present

## 2020-02-07 DIAGNOSIS — G309 Alzheimer's disease, unspecified: Secondary | ICD-10-CM | POA: Diagnosis not present

## 2020-02-15 DIAGNOSIS — Z9189 Other specified personal risk factors, not elsewhere classified: Secondary | ICD-10-CM | POA: Diagnosis not present

## 2020-02-15 DIAGNOSIS — H47093 Other disorders of optic nerve, not elsewhere classified, bilateral: Secondary | ICD-10-CM | POA: Diagnosis not present

## 2020-02-15 DIAGNOSIS — H2513 Age-related nuclear cataract, bilateral: Secondary | ICD-10-CM | POA: Diagnosis not present

## 2020-02-17 ENCOUNTER — Telehealth: Payer: Self-pay

## 2020-02-17 NOTE — Telephone Encounter (Signed)
Volunteer called patient on behalf of Palliative Care and did not get a answer from patient/family. ° °

## 2020-02-25 DIAGNOSIS — G4733 Obstructive sleep apnea (adult) (pediatric): Secondary | ICD-10-CM | POA: Diagnosis not present

## 2020-03-21 DIAGNOSIS — Z1152 Encounter for screening for COVID-19: Secondary | ICD-10-CM | POA: Diagnosis not present

## 2020-03-27 DIAGNOSIS — G4733 Obstructive sleep apnea (adult) (pediatric): Secondary | ICD-10-CM | POA: Diagnosis not present

## 2020-04-27 DIAGNOSIS — G4733 Obstructive sleep apnea (adult) (pediatric): Secondary | ICD-10-CM | POA: Diagnosis not present

## 2020-05-25 DIAGNOSIS — G4733 Obstructive sleep apnea (adult) (pediatric): Secondary | ICD-10-CM | POA: Diagnosis not present

## 2020-06-04 DIAGNOSIS — F0280 Dementia in other diseases classified elsewhere without behavioral disturbance: Secondary | ICD-10-CM | POA: Diagnosis not present

## 2020-06-04 DIAGNOSIS — H532 Diplopia: Secondary | ICD-10-CM | POA: Diagnosis not present

## 2020-06-04 DIAGNOSIS — R251 Tremor, unspecified: Secondary | ICD-10-CM | POA: Diagnosis not present

## 2020-06-04 DIAGNOSIS — R262 Difficulty in walking, not elsewhere classified: Secondary | ICD-10-CM | POA: Diagnosis not present

## 2020-06-04 DIAGNOSIS — F0150 Vascular dementia without behavioral disturbance: Secondary | ICD-10-CM | POA: Diagnosis not present

## 2020-06-04 DIAGNOSIS — Z8669 Personal history of other diseases of the nervous system and sense organs: Secondary | ICD-10-CM | POA: Diagnosis not present

## 2020-06-04 DIAGNOSIS — G309 Alzheimer's disease, unspecified: Secondary | ICD-10-CM | POA: Diagnosis not present

## 2020-06-04 DIAGNOSIS — R413 Other amnesia: Secondary | ICD-10-CM | POA: Diagnosis not present

## 2020-06-25 DIAGNOSIS — G4733 Obstructive sleep apnea (adult) (pediatric): Secondary | ICD-10-CM | POA: Diagnosis not present

## 2020-06-27 DIAGNOSIS — L718 Other rosacea: Secondary | ICD-10-CM | POA: Diagnosis not present

## 2020-06-27 DIAGNOSIS — L821 Other seborrheic keratosis: Secondary | ICD-10-CM | POA: Diagnosis not present

## 2020-06-27 DIAGNOSIS — D1801 Hemangioma of skin and subcutaneous tissue: Secondary | ICD-10-CM | POA: Diagnosis not present

## 2020-06-27 DIAGNOSIS — L853 Xerosis cutis: Secondary | ICD-10-CM | POA: Diagnosis not present

## 2020-06-27 DIAGNOSIS — L570 Actinic keratosis: Secondary | ICD-10-CM | POA: Diagnosis not present

## 2020-06-27 DIAGNOSIS — L239 Allergic contact dermatitis, unspecified cause: Secondary | ICD-10-CM | POA: Diagnosis not present

## 2020-06-27 DIAGNOSIS — D2262 Melanocytic nevi of left upper limb, including shoulder: Secondary | ICD-10-CM | POA: Diagnosis not present

## 2020-06-27 DIAGNOSIS — L814 Other melanin hyperpigmentation: Secondary | ICD-10-CM | POA: Diagnosis not present

## 2020-06-27 DIAGNOSIS — D224 Melanocytic nevi of scalp and neck: Secondary | ICD-10-CM | POA: Diagnosis not present

## 2020-07-02 DIAGNOSIS — R2689 Other abnormalities of gait and mobility: Secondary | ICD-10-CM | POA: Diagnosis not present

## 2020-07-02 DIAGNOSIS — M6281 Muscle weakness (generalized): Secondary | ICD-10-CM | POA: Diagnosis not present

## 2020-07-09 DIAGNOSIS — M6281 Muscle weakness (generalized): Secondary | ICD-10-CM | POA: Diagnosis not present

## 2020-07-09 DIAGNOSIS — R2689 Other abnormalities of gait and mobility: Secondary | ICD-10-CM | POA: Diagnosis not present

## 2020-07-11 DIAGNOSIS — R3915 Urgency of urination: Secondary | ICD-10-CM | POA: Insufficient documentation

## 2020-07-12 DIAGNOSIS — R3915 Urgency of urination: Secondary | ICD-10-CM | POA: Diagnosis not present

## 2020-07-25 DIAGNOSIS — G4733 Obstructive sleep apnea (adult) (pediatric): Secondary | ICD-10-CM | POA: Diagnosis not present

## 2020-07-25 DIAGNOSIS — R2689 Other abnormalities of gait and mobility: Secondary | ICD-10-CM | POA: Diagnosis not present

## 2020-07-25 DIAGNOSIS — M6281 Muscle weakness (generalized): Secondary | ICD-10-CM | POA: Diagnosis not present

## 2020-07-27 DIAGNOSIS — M1711 Unilateral primary osteoarthritis, right knee: Secondary | ICD-10-CM | POA: Diagnosis not present

## 2020-08-01 DIAGNOSIS — M6281 Muscle weakness (generalized): Secondary | ICD-10-CM | POA: Diagnosis not present

## 2020-08-01 DIAGNOSIS — R2689 Other abnormalities of gait and mobility: Secondary | ICD-10-CM | POA: Diagnosis not present

## 2020-08-08 DIAGNOSIS — M6281 Muscle weakness (generalized): Secondary | ICD-10-CM | POA: Diagnosis not present

## 2020-08-08 DIAGNOSIS — R2689 Other abnormalities of gait and mobility: Secondary | ICD-10-CM | POA: Diagnosis not present

## 2020-08-15 DIAGNOSIS — R2689 Other abnormalities of gait and mobility: Secondary | ICD-10-CM | POA: Diagnosis not present

## 2020-08-15 DIAGNOSIS — M6281 Muscle weakness (generalized): Secondary | ICD-10-CM | POA: Diagnosis not present

## 2020-08-21 ENCOUNTER — Telehealth: Payer: Self-pay

## 2020-08-21 DIAGNOSIS — R2689 Other abnormalities of gait and mobility: Secondary | ICD-10-CM | POA: Diagnosis not present

## 2020-08-21 DIAGNOSIS — M6281 Muscle weakness (generalized): Secondary | ICD-10-CM | POA: Diagnosis not present

## 2020-08-21 NOTE — Telephone Encounter (Signed)
Palliative care volunteer call placed to check in on patient. Per son, patient is doing well.

## 2020-08-25 DIAGNOSIS — G4733 Obstructive sleep apnea (adult) (pediatric): Secondary | ICD-10-CM | POA: Diagnosis not present

## 2020-08-27 DIAGNOSIS — R2689 Other abnormalities of gait and mobility: Secondary | ICD-10-CM | POA: Diagnosis not present

## 2020-08-27 DIAGNOSIS — M6281 Muscle weakness (generalized): Secondary | ICD-10-CM | POA: Diagnosis not present

## 2020-09-17 DIAGNOSIS — G2581 Restless legs syndrome: Secondary | ICD-10-CM | POA: Diagnosis not present

## 2020-09-17 DIAGNOSIS — R262 Difficulty in walking, not elsewhere classified: Secondary | ICD-10-CM | POA: Diagnosis not present

## 2020-09-17 DIAGNOSIS — R7309 Other abnormal glucose: Secondary | ICD-10-CM | POA: Diagnosis not present

## 2020-09-17 DIAGNOSIS — H532 Diplopia: Secondary | ICD-10-CM | POA: Diagnosis not present

## 2020-09-17 DIAGNOSIS — F39 Unspecified mood [affective] disorder: Secondary | ICD-10-CM | POA: Diagnosis not present

## 2020-09-17 DIAGNOSIS — R413 Other amnesia: Secondary | ICD-10-CM | POA: Diagnosis not present

## 2020-09-17 DIAGNOSIS — R5383 Other fatigue: Secondary | ICD-10-CM | POA: Diagnosis not present

## 2020-09-17 DIAGNOSIS — R251 Tremor, unspecified: Secondary | ICD-10-CM | POA: Diagnosis not present

## 2020-09-26 DIAGNOSIS — M6281 Muscle weakness (generalized): Secondary | ICD-10-CM | POA: Diagnosis not present

## 2020-09-26 DIAGNOSIS — R2689 Other abnormalities of gait and mobility: Secondary | ICD-10-CM | POA: Diagnosis not present

## 2020-11-30 DIAGNOSIS — G8929 Other chronic pain: Secondary | ICD-10-CM | POA: Diagnosis not present

## 2020-11-30 DIAGNOSIS — E785 Hyperlipidemia, unspecified: Secondary | ICD-10-CM | POA: Diagnosis not present

## 2020-11-30 DIAGNOSIS — G20 Parkinson's disease: Secondary | ICD-10-CM | POA: Diagnosis not present

## 2020-11-30 DIAGNOSIS — F319 Bipolar disorder, unspecified: Secondary | ICD-10-CM | POA: Diagnosis not present

## 2020-11-30 DIAGNOSIS — D6869 Other thrombophilia: Secondary | ICD-10-CM | POA: Diagnosis not present

## 2020-11-30 DIAGNOSIS — G4733 Obstructive sleep apnea (adult) (pediatric): Secondary | ICD-10-CM | POA: Diagnosis not present

## 2020-11-30 DIAGNOSIS — F0150 Vascular dementia without behavioral disturbance: Secondary | ICD-10-CM | POA: Diagnosis not present

## 2020-11-30 DIAGNOSIS — G309 Alzheimer's disease, unspecified: Secondary | ICD-10-CM | POA: Diagnosis not present

## 2020-11-30 DIAGNOSIS — I4891 Unspecified atrial fibrillation: Secondary | ICD-10-CM | POA: Diagnosis not present

## 2020-12-18 DIAGNOSIS — R7303 Prediabetes: Secondary | ICD-10-CM | POA: Diagnosis not present

## 2020-12-18 DIAGNOSIS — I10 Essential (primary) hypertension: Secondary | ICD-10-CM | POA: Diagnosis not present

## 2020-12-18 DIAGNOSIS — Z125 Encounter for screening for malignant neoplasm of prostate: Secondary | ICD-10-CM | POA: Diagnosis not present

## 2020-12-18 DIAGNOSIS — E7801 Familial hypercholesterolemia: Secondary | ICD-10-CM | POA: Diagnosis not present

## 2020-12-19 ENCOUNTER — Telehealth: Payer: Self-pay

## 2020-12-19 NOTE — Telephone Encounter (Signed)
Rn call to check on patient, no answer.. Voicemail left

## 2021-01-01 DIAGNOSIS — E039 Hypothyroidism, unspecified: Secondary | ICD-10-CM | POA: Diagnosis not present

## 2021-01-01 DIAGNOSIS — F319 Bipolar disorder, unspecified: Secondary | ICD-10-CM | POA: Diagnosis not present

## 2021-01-01 DIAGNOSIS — G2581 Restless legs syndrome: Secondary | ICD-10-CM | POA: Diagnosis not present

## 2021-01-01 DIAGNOSIS — R7303 Prediabetes: Secondary | ICD-10-CM | POA: Diagnosis not present

## 2021-01-01 DIAGNOSIS — Z23 Encounter for immunization: Secondary | ICD-10-CM | POA: Diagnosis not present

## 2021-01-01 DIAGNOSIS — Z0000 Encounter for general adult medical examination without abnormal findings: Secondary | ICD-10-CM | POA: Diagnosis not present

## 2021-01-01 DIAGNOSIS — D696 Thrombocytopenia, unspecified: Secondary | ICD-10-CM | POA: Diagnosis not present

## 2021-01-01 DIAGNOSIS — I4891 Unspecified atrial fibrillation: Secondary | ICD-10-CM | POA: Diagnosis not present

## 2021-01-01 DIAGNOSIS — F0150 Vascular dementia without behavioral disturbance: Secondary | ICD-10-CM | POA: Diagnosis not present

## 2021-02-14 ENCOUNTER — Other Ambulatory Visit: Payer: Self-pay

## 2021-02-14 ENCOUNTER — Ambulatory Visit (INDEPENDENT_AMBULATORY_CARE_PROVIDER_SITE_OTHER): Payer: Medicare PPO | Admitting: Pulmonary Disease

## 2021-02-14 ENCOUNTER — Encounter: Payer: Self-pay | Admitting: Pulmonary Disease

## 2021-02-14 VITALS — BP 138/64 | HR 83 | Temp 98.0°F | Ht 69.0 in | Wt 209.0 lb

## 2021-02-14 DIAGNOSIS — G4733 Obstructive sleep apnea (adult) (pediatric): Secondary | ICD-10-CM | POA: Insufficient documentation

## 2021-02-14 DIAGNOSIS — Z9989 Dependence on other enabling machines and devices: Secondary | ICD-10-CM

## 2021-02-14 NOTE — Assessment & Plan Note (Signed)
He is apparently maintained on CPAP of 11 cm.  We will obtain CPAP download from [facility] and tweak settings as needed. Will also provide him with new supplies including nasal pillows and filters I impressed upon him the need to wear his CPAP nightly explained to him that this will help him with his memory issues and also with his cardiovascular health  Weight loss encouraged, compliance with goal of at least 4-6 hrs every night is the expectation. Advised against medications with sedative side effects Cautioned against driving when sleepy - understanding that sleepiness will vary on a day to day basis

## 2021-02-14 NOTE — Progress Notes (Signed)
Subjective:    Patient ID: [name] [name], male    DOB: [birth year], 75 y.o.   MRN: [id number]  HPI  Daughter is a 34 year old man who presents to establish care for OSA He is accompanied by his wife [name] [name] who corroborates most of the history since he has Alzheimer's/vascular dementia. OSA was diagnosed in 2014 and he has been maintained on CPAP of 11 cm with nasal pillows, DME is [facility].  I note that home sleep study in 06/2019 again diagnosed severe OSA and he may have obtained a new CPAP at this time however wife or patient does not remember details of this.  They have not changed her supplies since 2021. Epworth sleepiness score is 17 and she reports that he remained sleepy during the day she does report compliance with the CPAP machine but its possible that he takes this off during the night. Bedtime is between 11 PM and midnight, sleep latency is minimal, he sleeps on his side around 10 or 11 AM feeling refreshed without dryness of mouth or headaches his weight is fluctuated within 10 pounds of his current weight of 212 pounds There is no history suggestive of cataplexy, sleep paralysis or parasomnias  He suffers from restless leg syndrome and takes Sinemet twice daily.  Medication review also shows other sedative medications such as Depakote, gabapentin and Lamictal    PMH -restless leg syndrome on Sinemet Vascular dementia Atrial fibrillation Bipolar    Significant tests/ events reviewed  HST 06/2019 AHI 42/hour, lowest desaturation 69%   05/2012 NPSG -AHI 26/hour 06/2012 CPAP titration >> 11 cm  Past Medical History:  Diagnosis Date   Alzheimer disease ([place])    Anxiety    Arthritis    Atrial fibrillation ([place])    Chronic   Bipolar disorder (HCC)    Chronic low back pain    Dementia ([place])    Depression    Dizziness    Dysrhythmia    a fib   ED (erectile dysfunction)    External hemorrhoids    Fatigue    HLD (hyperlipidemia)    HTN (hypertension)     Hypogonadism in male    Lipoma of skin    Memory loss    Mixed Alzheimer's and vascular dementia (HCC)    Poor balance    Restless legs syndrome (RLS)    Sleep apnea    cpap   Stroke ([place]) 1991   Tremor     Past Surgical History:  Procedure Laterality Date   COLONOSCOPY     KNEE ARTHROPLASTY Left 06/04/2017   Procedure: LEFT TOTAL KNEE ARTHROPLASTY WITH COMPUTER NAVIGATION;  Surgeon: Rod Can, MD;  Location: [facility];  Service: Orthopedics;  Laterality: Left;  NEEDS RNFA   TONSILLECTOMY AND ADENOIDECTOMY     TOTAL HIP ARTHROPLASTY Right 07/31/2016   Procedure: RIGHT TOTAL HIP ARTHROPLASTY ANTERIOR APPROACH;  Surgeon: Rod Can, MD;  Location: [facility];  Service: Orthopedics;  Laterality: Right;  Requesting RNFA    Allergies  Allergen Reactions   Latex Rash and Other (See Comments)    Reaction to knee wraps     Social History   Socioeconomic History   Marital status: Married    Spouse name: Not on file   Number of children: 3   Years of education: Not on file   Highest education level: Not on file  Occupational History   Occupation: retired  Tobacco Use   Smoking status: Never   Smokeless tobacco: Former  Quit date: 07/23/1968  Vaping Use   Vaping Use: Never used  Substance and Sexual Activity   Alcohol use: Yes    Alcohol/week: 1.0 standard drink    Types: 1 Shots of liquor per week    Comment: occasional   Drug use: No   Sexual activity: Yes  Other Topics Concern   Not on file  Social History Narrative   Not on file   Social Determinants of Health   Financial Resource Strain: Not on file  Food Insecurity: Not on file  Transportation Needs: Not on file  Physical Activity: Not on file  Stress: Not on file  Social Connections: Not on file  Intimate Partner Violence: Not on file     Family History  Problem Relation Age of Onset   Suicidality Unknown    Cancer Unknown    Alcohol abuse Unknown    Cancer Mother    Suicidality Father       Review of Systems Constitutional: negative for anorexia, fevers and sweats  Eyes: negative for irritation, redness and visual disturbance  Ears, nose, mouth, throat, and face: negative for earaches, epistaxis, nasal congestion and sore throat  Respiratory: negative for cough, dyspnea on exertion, sputum and wheezing  Cardiovascular: negative for chest pain, dyspnea, lower extremity edema, orthopnea, palpitations and syncope  Gastrointestinal: negative for abdominal pain, constipation, diarrhea, melena, nausea and vomiting  Genitourinary:negative for dysuria, frequency and hematuria  Hematologic/lymphatic: negative for bleeding, easy bruising and lymphadenopathy  Musculoskeletal:negative for arthralgias, muscle weakness and stiff joints  Neurological: negative for coordination problems, gait problems, headaches and weakness  Endocrine: negative for diabetic symptoms including polydipsia, polyuria and weight loss     Objective:   Physical Exam  Gen. Pleasant, elderly, in no distress, normal affect ENT - no pallor,icterus, no post nasal drip, class 2 airway Neck: No JVD, no thyromegaly, no carotid bruits Lungs: no use of accessory muscles, no dullness to percussion, decreased without rales or rhonchi  Cardiovascular: Rhythm regular, heart sounds  normal, no murmurs or gallops, no peripheral edema Abdomen: soft and non-tender, no hepatosplenomegaly, BS normal. Musculoskeletal: No deformities, no cyanosis or clubbing Neuro:  alert, non focal, no tremors, forgetful       Assessment & Plan:    Dementia -clearly his compliance will be limited by his behavior issues but hopeful that he will continue to use his CPAP machine

## 2021-02-14 NOTE — Patient Instructions (Signed)
°  X set up with new DME for CPAP report & new CPAP supplies including filters, nasal pillows ,medium

## 2021-02-15 ENCOUNTER — Telehealth: Payer: Self-pay | Admitting: Pulmonary Disease

## 2021-02-15 NOTE — Telephone Encounter (Signed)
Called patient's wife but she did not answer. Left message for her to call us back on Monday.

## 2021-02-19 NOTE — Telephone Encounter (Signed)
Attempted to call pt's spouse [name] but unable to reach. Left message for her to return call. Due to multiple attempts trying to call her with unable to do so, per protocol encounter will be closed.

## 2021-03-07 ENCOUNTER — Telehealth: Payer: Self-pay | Admitting: Pulmonary Disease

## 2021-03-07 NOTE — Telephone Encounter (Signed)
I called the wife (DPR) and she is going to call the DME company tomorrow to check on the supplies and she will call the office if there is any other problems. Nothing further needed.

## 2021-03-12 DIAGNOSIS — G629 Polyneuropathy, unspecified: Secondary | ICD-10-CM | POA: Diagnosis not present

## 2021-03-12 DIAGNOSIS — E669 Obesity, unspecified: Secondary | ICD-10-CM | POA: Diagnosis not present

## 2021-03-12 DIAGNOSIS — F319 Bipolar disorder, unspecified: Secondary | ICD-10-CM | POA: Diagnosis not present

## 2021-03-12 DIAGNOSIS — G8929 Other chronic pain: Secondary | ICD-10-CM | POA: Diagnosis not present

## 2021-03-12 DIAGNOSIS — E785 Hyperlipidemia, unspecified: Secondary | ICD-10-CM | POA: Diagnosis not present

## 2021-03-12 DIAGNOSIS — I4891 Unspecified atrial fibrillation: Secondary | ICD-10-CM | POA: Diagnosis not present

## 2021-03-12 DIAGNOSIS — I10 Essential (primary) hypertension: Secondary | ICD-10-CM | POA: Diagnosis not present

## 2021-03-12 DIAGNOSIS — G309 Alzheimer's disease, unspecified: Secondary | ICD-10-CM | POA: Diagnosis not present

## 2021-03-12 DIAGNOSIS — D6869 Other thrombophilia: Secondary | ICD-10-CM | POA: Diagnosis not present

## 2021-03-22 ENCOUNTER — Telehealth: Payer: Self-pay

## 2021-03-22 NOTE — Telephone Encounter (Signed)
Volunteer called patient/family on behalf of [facility] and did not get a answer from patient/family. ° °

## 2021-03-27 DIAGNOSIS — Z79899 Other long term (current) drug therapy: Secondary | ICD-10-CM | POA: Diagnosis not present

## 2021-03-27 DIAGNOSIS — F0150 Vascular dementia without behavioral disturbance: Secondary | ICD-10-CM | POA: Diagnosis not present

## 2021-03-27 DIAGNOSIS — R413 Other amnesia: Secondary | ICD-10-CM | POA: Diagnosis not present

## 2021-03-27 DIAGNOSIS — R262 Difficulty in walking, not elsewhere classified: Secondary | ICD-10-CM | POA: Diagnosis not present

## 2021-03-27 DIAGNOSIS — F0280 Dementia in other diseases classified elsewhere without behavioral disturbance: Secondary | ICD-10-CM | POA: Diagnosis not present

## 2021-03-27 DIAGNOSIS — G2581 Restless legs syndrome: Secondary | ICD-10-CM | POA: Diagnosis not present

## 2021-03-27 DIAGNOSIS — H532 Diplopia: Secondary | ICD-10-CM | POA: Diagnosis not present

## 2021-03-27 DIAGNOSIS — R251 Tremor, unspecified: Secondary | ICD-10-CM | POA: Diagnosis not present

## 2021-03-27 DIAGNOSIS — G309 Alzheimer's disease, unspecified: Secondary | ICD-10-CM | POA: Diagnosis not present

## 2021-04-27 DIAGNOSIS — R918 Other nonspecific abnormal finding of lung field: Secondary | ICD-10-CM | POA: Diagnosis not present

## 2021-04-27 DIAGNOSIS — I959 Hypotension, unspecified: Secondary | ICD-10-CM | POA: Diagnosis not present

## 2021-04-27 DIAGNOSIS — I10 Essential (primary) hypertension: Secondary | ICD-10-CM | POA: Diagnosis not present

## 2021-04-27 DIAGNOSIS — Z8673 Personal history of transient ischemic attack (TIA), and cerebral infarction without residual deficits: Secondary | ICD-10-CM | POA: Diagnosis not present

## 2021-04-27 DIAGNOSIS — G2581 Restless legs syndrome: Secondary | ICD-10-CM | POA: Diagnosis not present

## 2021-04-27 DIAGNOSIS — I4891 Unspecified atrial fibrillation: Secondary | ICD-10-CM | POA: Diagnosis not present

## 2021-04-27 DIAGNOSIS — F0280 Dementia in other diseases classified elsewhere without behavioral disturbance: Secondary | ICD-10-CM | POA: Diagnosis not present

## 2021-04-27 DIAGNOSIS — R55 Syncope and collapse: Secondary | ICD-10-CM | POA: Diagnosis not present

## 2021-04-27 DIAGNOSIS — Z7901 Long term (current) use of anticoagulants: Secondary | ICD-10-CM | POA: Diagnosis not present

## 2021-04-27 DIAGNOSIS — G309 Alzheimer's disease, unspecified: Secondary | ICD-10-CM | POA: Diagnosis not present

## 2021-04-27 DIAGNOSIS — R4020 Unspecified coma: Secondary | ICD-10-CM | POA: Diagnosis not present

## 2021-04-27 DIAGNOSIS — F319 Bipolar disorder, unspecified: Secondary | ICD-10-CM | POA: Diagnosis not present

## 2021-04-27 DIAGNOSIS — I517 Cardiomegaly: Secondary | ICD-10-CM | POA: Diagnosis not present

## 2021-04-27 DIAGNOSIS — R001 Bradycardia, unspecified: Secondary | ICD-10-CM | POA: Diagnosis not present

## 2021-04-28 DIAGNOSIS — I4891 Unspecified atrial fibrillation: Secondary | ICD-10-CM | POA: Diagnosis not present

## 2021-04-28 DIAGNOSIS — I959 Hypotension, unspecified: Secondary | ICD-10-CM | POA: Diagnosis not present

## 2021-04-28 DIAGNOSIS — R55 Syncope and collapse: Secondary | ICD-10-CM | POA: Diagnosis not present

## 2021-04-28 DIAGNOSIS — G309 Alzheimer's disease, unspecified: Secondary | ICD-10-CM | POA: Diagnosis not present

## 2021-05-01 DIAGNOSIS — I4891 Unspecified atrial fibrillation: Secondary | ICD-10-CM | POA: Diagnosis not present

## 2021-05-01 DIAGNOSIS — I509 Heart failure, unspecified: Secondary | ICD-10-CM | POA: Diagnosis not present

## 2021-05-01 DIAGNOSIS — E039 Hypothyroidism, unspecified: Secondary | ICD-10-CM | POA: Diagnosis not present

## 2021-05-01 DIAGNOSIS — R600 Localized edema: Secondary | ICD-10-CM | POA: Diagnosis not present

## 2021-05-01 DIAGNOSIS — D649 Anemia, unspecified: Secondary | ICD-10-CM | POA: Diagnosis not present

## 2021-05-01 DIAGNOSIS — I10 Essential (primary) hypertension: Secondary | ICD-10-CM | POA: Diagnosis not present

## 2021-06-04 DIAGNOSIS — N529 Male erectile dysfunction, unspecified: Secondary | ICD-10-CM | POA: Diagnosis not present

## 2021-06-04 DIAGNOSIS — R634 Abnormal weight loss: Secondary | ICD-10-CM | POA: Diagnosis not present

## 2021-07-02 DIAGNOSIS — L821 Other seborrheic keratosis: Secondary | ICD-10-CM | POA: Diagnosis not present

## 2021-07-02 DIAGNOSIS — L853 Xerosis cutis: Secondary | ICD-10-CM | POA: Diagnosis not present

## 2021-07-02 DIAGNOSIS — L814 Other melanin hyperpigmentation: Secondary | ICD-10-CM | POA: Diagnosis not present

## 2021-07-02 DIAGNOSIS — D171 Benign lipomatous neoplasm of skin and subcutaneous tissue of trunk: Secondary | ICD-10-CM | POA: Diagnosis not present

## 2021-07-02 DIAGNOSIS — D1801 Hemangioma of skin and subcutaneous tissue: Secondary | ICD-10-CM | POA: Diagnosis not present

## 2021-07-02 DIAGNOSIS — L718 Other rosacea: Secondary | ICD-10-CM | POA: Diagnosis not present

## 2021-07-03 ENCOUNTER — Telehealth: Payer: Self-pay | Admitting: Cardiovascular Disease

## 2021-07-03 NOTE — Telephone Encounter (Signed)
Raford Pitcher called wanting to know if patient should be taking lisinopril (PRINIVIL,ZESTRIL) 10 MG tablet. ?

## 2021-07-04 NOTE — Telephone Encounter (Signed)
Spoke with Raford Pitcher, regarding lisinopril. Explained that Dr. Gwenlyn Found has not seen this pt in about 2 years and would not be able to advise on this medication. Per EMR review, it looks like Dr. Shelia Media is the prescriber for this medication and he would be who could decide if the pt needs to be on this medication. Grishma verbalizes understanding.  ?

## 2021-07-04 NOTE — Telephone Encounter (Signed)
Left message for Raford Pitcher to call back. ?

## 2021-07-04 NOTE — Telephone Encounter (Signed)
Raford Pitcher, Case Manager, again following up regarding whether or not patient should be taking medication. I informed her that the patient has not been seen in 2 years and we are unsure whether or not it is being prescribed by his PCP. I offered to schedule a follow up appointment with Dr. Gwenlyn Found, but Raford Pitcher requested a call back from Dr. Kennon Holter nurse. She states she would like to further discuss the lisinopril first if possible. ?

## 2021-07-15 DIAGNOSIS — Z471 Aftercare following joint replacement surgery: Secondary | ICD-10-CM | POA: Diagnosis not present

## 2021-07-15 DIAGNOSIS — M1711 Unilateral primary osteoarthritis, right knee: Secondary | ICD-10-CM | POA: Diagnosis not present

## 2021-07-15 DIAGNOSIS — Z96652 Presence of left artificial knee joint: Secondary | ICD-10-CM | POA: Diagnosis not present

## 2021-08-07 DIAGNOSIS — E538 Deficiency of other specified B group vitamins: Secondary | ICD-10-CM | POA: Diagnosis not present

## 2021-08-07 DIAGNOSIS — Z79899 Other long term (current) drug therapy: Secondary | ICD-10-CM | POA: Diagnosis not present

## 2021-08-07 DIAGNOSIS — R251 Tremor, unspecified: Secondary | ICD-10-CM | POA: Diagnosis not present

## 2021-08-07 DIAGNOSIS — F39 Unspecified mood [affective] disorder: Secondary | ICD-10-CM | POA: Diagnosis not present

## 2021-08-07 DIAGNOSIS — R413 Other amnesia: Secondary | ICD-10-CM | POA: Diagnosis not present

## 2021-08-07 DIAGNOSIS — E038 Other specified hypothyroidism: Secondary | ICD-10-CM | POA: Diagnosis not present

## 2021-08-07 DIAGNOSIS — R262 Difficulty in walking, not elsewhere classified: Secondary | ICD-10-CM | POA: Diagnosis not present

## 2021-08-07 DIAGNOSIS — H532 Diplopia: Secondary | ICD-10-CM | POA: Diagnosis not present

## 2021-08-07 DIAGNOSIS — R7309 Other abnormal glucose: Secondary | ICD-10-CM | POA: Diagnosis not present

## 2021-08-07 DIAGNOSIS — E559 Vitamin D deficiency, unspecified: Secondary | ICD-10-CM | POA: Diagnosis not present

## 2021-08-07 DIAGNOSIS — R5383 Other fatigue: Secondary | ICD-10-CM | POA: Diagnosis not present

## 2021-08-12 DIAGNOSIS — N529 Male erectile dysfunction, unspecified: Secondary | ICD-10-CM | POA: Diagnosis not present

## 2021-08-12 DIAGNOSIS — N401 Enlarged prostate with lower urinary tract symptoms: Secondary | ICD-10-CM | POA: Diagnosis not present

## 2021-09-20 ENCOUNTER — Emergency Department (HOSPITAL_COMMUNITY)
Admission: EM | Admit: 2021-09-20 | Discharge: 2021-09-20 | Disposition: A | Discharge status: Home / Self Care | Payer: Medicare PPO | Attending: Emergency Medicine | Admitting: Emergency Medicine

## 2021-09-20 ENCOUNTER — Other Ambulatory Visit: Payer: Self-pay

## 2021-09-20 ENCOUNTER — Emergency Department (HOSPITAL_COMMUNITY): Payer: Medicare PPO

## 2021-09-20 ENCOUNTER — Encounter (HOSPITAL_COMMUNITY): Payer: Self-pay | Admitting: Emergency Medicine

## 2021-09-20 DIAGNOSIS — I4891 Unspecified atrial fibrillation: Secondary | ICD-10-CM | POA: Insufficient documentation

## 2021-09-20 DIAGNOSIS — S0181XA Laceration without foreign body of other part of head, initial encounter: Secondary | ICD-10-CM

## 2021-09-20 DIAGNOSIS — Z7901 Long term (current) use of anticoagulants: Secondary | ICD-10-CM | POA: Diagnosis not present

## 2021-09-20 DIAGNOSIS — I10 Essential (primary) hypertension: Secondary | ICD-10-CM | POA: Diagnosis not present

## 2021-09-20 DIAGNOSIS — S0003XA Contusion of scalp, initial encounter: Secondary | ICD-10-CM | POA: Diagnosis not present

## 2021-09-20 DIAGNOSIS — T50991A Poisoning by other drugs, medicaments and biological substances, accidental (unintentional), initial encounter: Secondary | ICD-10-CM | POA: Diagnosis not present

## 2021-09-20 DIAGNOSIS — M4312 Spondylolisthesis, cervical region: Secondary | ICD-10-CM | POA: Diagnosis not present

## 2021-09-20 DIAGNOSIS — S299XXA Unspecified injury of thorax, initial encounter: Secondary | ICD-10-CM | POA: Diagnosis not present

## 2021-09-20 DIAGNOSIS — W19XXXA Unspecified fall, initial encounter: Secondary | ICD-10-CM | POA: Insufficient documentation

## 2021-09-20 DIAGNOSIS — Z79899 Other long term (current) drug therapy: Secondary | ICD-10-CM | POA: Diagnosis not present

## 2021-09-20 DIAGNOSIS — T50901A Poisoning by unspecified drugs, medicaments and biological substances, accidental (unintentional), initial encounter: Secondary | ICD-10-CM | POA: Diagnosis not present

## 2021-09-20 DIAGNOSIS — T6591XA Toxic effect of unspecified substance, accidental (unintentional), initial encounter: Secondary | ICD-10-CM

## 2021-09-20 DIAGNOSIS — Z23 Encounter for immunization: Secondary | ICD-10-CM | POA: Insufficient documentation

## 2021-09-20 DIAGNOSIS — F0390 Unspecified dementia without behavioral disturbance: Secondary | ICD-10-CM | POA: Diagnosis not present

## 2021-09-20 DIAGNOSIS — Z91040 Latex allergy status: Secondary | ICD-10-CM | POA: Insufficient documentation

## 2021-09-20 DIAGNOSIS — S0993XA Unspecified injury of face, initial encounter: Secondary | ICD-10-CM | POA: Diagnosis present

## 2021-09-20 DIAGNOSIS — I517 Cardiomegaly: Secondary | ICD-10-CM | POA: Diagnosis not present

## 2021-09-20 LAB — CBC WITH DIFFERENTIAL/PLATELET
Abs Immature Granulocytes: 0.04 10*3/uL (ref 0.00–0.07)
Basophils Absolute: 0 10*3/uL (ref 0.0–0.1)
Basophils Relative: 1 %
Eosinophils Absolute: 0.3 10*3/uL (ref 0.0–0.5)
Eosinophils Relative: 4 %
HCT: 37.8 % — ABNORMAL LOW (ref 39.0–52.0)
Hemoglobin: 12.4 g/dL — ABNORMAL LOW (ref 13.0–17.0)
Immature Granulocytes: 1 %
Lymphocytes Relative: 35 %
Lymphs Abs: 2 10*3/uL (ref 0.7–4.0)
MCH: 31.6 pg (ref 26.0–34.0)
MCHC: 32.8 g/dL (ref 30.0–36.0)
MCV: 96.2 fL (ref 80.0–100.0)
Monocytes Absolute: 0.6 10*3/uL (ref 0.1–1.0)
Monocytes Relative: 10 %
Neutro Abs: 2.9 10*3/uL (ref 1.7–7.7)
Neutrophils Relative %: 49 %
Platelets: 129 10*3/uL — ABNORMAL LOW (ref 150–400)
RBC: 3.93 MIL/uL — ABNORMAL LOW (ref 4.22–5.81)
RDW: 13.7 % (ref 11.5–15.5)
WBC: 5.9 10*3/uL (ref 4.0–10.5)
nRBC: 0 % (ref 0.0–0.2)

## 2021-09-20 LAB — SALICYLATE LEVEL: Salicylate Lvl: <7 mg/dL — ABNORMAL LOW (ref 7.0–30.0)

## 2021-09-20 LAB — COMPREHENSIVE METABOLIC PANEL
ALT: 25 U/L (ref 0–44)
AST: 26 U/L (ref 15–41)
Albumin: 3.7 g/dL (ref 3.5–5.0)
Alkaline Phosphatase: 74 U/L (ref 38–126)
Anion gap: 9 (ref 5–15)
BUN: 22 mg/dL (ref 8–23)
CO2: 26 mmol/L (ref 22–32)
Calcium: 10.5 mg/dL — ABNORMAL HIGH (ref 8.9–10.3)
Chloride: 103 mmol/L (ref 98–111)
Creatinine, Ser: 1.42 mg/dL — ABNORMAL HIGH (ref 0.61–1.24)
GFR, Estimated: 51 mL/min — ABNORMAL LOW (ref >60)
Glucose, Bld: 129 mg/dL — ABNORMAL HIGH (ref 70–99)
Potassium: 3.8 mmol/L (ref 3.5–5.1)
Sodium: 138 mmol/L (ref 135–145)
Total Bilirubin: 0.4 mg/dL (ref 0.3–1.2)
Total Protein: 6.5 g/dL (ref 6.5–8.1)

## 2021-09-20 LAB — RAPID URINE DRUG SCREEN, HOSP PERFORMED
Amphetamines: NOT DETECTED
Barbiturates: NOT DETECTED
Benzodiazepines: NOT DETECTED
Cocaine: NOT DETECTED
Opiates: NOT DETECTED
Tetrahydrocannabinol: NOT DETECTED

## 2021-09-20 LAB — ETHANOL: Alcohol, Ethyl (B): <10 mg/dL (ref <10)

## 2021-09-20 LAB — VALPROIC ACID LEVEL: Valproic Acid Lvl: 44 ug/mL — ABNORMAL LOW (ref 50.0–100.0)

## 2021-09-20 LAB — ACETAMINOPHEN LEVEL: Acetaminophen (Tylenol), Serum: <10 ug/mL — ABNORMAL LOW (ref 10–30)

## 2021-09-20 MED ORDER — LIDOCAINE-EPINEPHRINE (PF) 2 %-1:200000 IJ SOLN
20.0000 mL | Freq: Once | INTRAMUSCULAR | Status: DC
  Filled 2021-09-20: qty 20

## 2021-09-20 MED ORDER — TETANUS-DIPHTH-ACELL PERTUSSIS 5-2.5-18.5 LF-MCG/0.5 IM SUSY
0.5000 mL | PREFILLED_SYRINGE | Freq: Once | INTRAMUSCULAR | Status: AC
Start: 2021-09-20 — End: 2021-09-20
  Administered 2021-09-20: 0.5 mL via INTRAMUSCULAR
  Filled 2021-09-20: qty 0.5

## 2021-09-20 NOTE — ED Provider Triage Note (Signed)
  Emergency Medicine Provider Triage Evaluation Note  MRN:  [id number]  Arrival date & time: 09/20/21    Medically screening exam initiated at 1:17 AM.   CC:   Ingestion   HPI:  [name] [name] is a 76 y.o. year-old male presents to the ED with chief complaint of unintentional overdose.  Patient took 400 mg of Viagra.  His wife called poison control and they were advised to come to the emergency department for evaluation.Marland Kitchen  History provided by patient. ROS:  -As included in HPI PE:   Vitals:   09/20/21 0109  BP: (!) 149/81  Pulse: 80  Resp: 20  Temp: 98.4 F (36.9 C)  SpO2: 96%    Non-toxic appearing No respiratory distress Semierect penis MDM:  Based on signs and symptoms, unintentional overdose is highest on my differential.   I discussed the case with poison control, who recommends 6-hour observation for monitoring of blood pressure.  Fluids as needed and pressors if needed.  Patient was informed that the remainder of the evaluation will be completed by another provider, this initial triage assessment does not replace that evaluation, and the importance of remaining in the ED until their evaluation is complete.    Montine Circle, PA-C 09/20/21 [phone]

## 2021-09-20 NOTE — ED Notes (Signed)
Trauma Response Nurse Documentation   [name] [name] is a 76 y.o. male arriving to [facility] ED via POV  On Xarelto (rivaroxaban) daily. Trauma was activated as a Level 2 by Rolanda Jay based on the following trauma criteria Elderly patients > 65 with head trauma on anti-coagulation (excluding ASA). Trauma team at the bedside on patient arrival. Patient cleared for CT by Dr. Randal Buba. Patient to CT with team. GCS 15.  History   Past Medical History:  Diagnosis Date   Alzheimer disease ([place])    Anxiety    Arthritis    Atrial fibrillation (HCC)    Chronic   Bipolar disorder (HCC)    Chronic low back pain    Dementia (HCC)    Depression    Dizziness    Dysrhythmia    a fib   ED (erectile dysfunction)    External hemorrhoids    Fatigue    HLD (hyperlipidemia)    HTN (hypertension)    Hypogonadism in male    Lipoma of skin    Memory loss    Mixed Alzheimer's and vascular dementia (HCC)    Poor balance    Restless legs syndrome (RLS)    Sleep apnea    cpap   Stroke ([place]) 1991   Tremor      Past Surgical History:  Procedure Laterality Date   COLONOSCOPY     KNEE ARTHROPLASTY Left 06/04/2017   Procedure: LEFT TOTAL KNEE ARTHROPLASTY WITH COMPUTER NAVIGATION;  Surgeon: Rod Can, MD;  Location: [facility];  Service: Orthopedics;  Laterality: Left;  NEEDS RNFA   TONSILLECTOMY AND ADENOIDECTOMY     TOTAL HIP ARTHROPLASTY Right 07/31/2016   Procedure: RIGHT TOTAL HIP ARTHROPLASTY ANTERIOR APPROACH;  Surgeon: Rod Can, MD;  Location: [facility];  Service: Orthopedics;  Laterality: Right;  Requesting RNFA       Initial Focused Assessment (If applicable, or please see trauma documentation): See event summary.  CT's Completed:   CT Head and CT C-Spine   Interventions:  See event summary.  Plan for disposition:  None at this time.  Consults completed:    Event Summary: Patient from home, initially in department for overdose of Viagra. While in the waiting  room, patient experienced a mechanical fall when getting up from the wheelchair he was in. Patient was placed in c collar by ED staff. Patient with laceration above left eye.   18G PIV LAC 20G PIV RAC Trauma labs Xray chest CT head CT c-spine   Bedside handoff with Brilliant and Vicente Males, EMT-P.    Trudee Kuster  Trauma Response RN  Please call TRN at [phone] for further assistance.

## 2021-09-20 NOTE — ED Notes (Signed)
This paramedic went into patient room due to hearing him talking to his wife about wanting to get up out of bed. Patient was found sitting upright at the end of the bed frustrated and asking for something to cover himself up with. Patient was advised to lay back in the bed due to his c-collar being in place. Patient had ripped out IV on the right side and was asking for his monitor to be turned off. Patient was assisted up in the bed and a new sheet was applied to the bed. Patients BP cuff was covered in blood from the IV that had been ripped out. Patient had new BP cuff applied and old IV wound bandaged. Patient was given warm blankets and wife at bedside was given call bell to alert staff of any changes with her husband.

## 2021-09-20 NOTE — Discharge Instructions (Addendum)
Return for redness drainage or if you get a fever.  The sutures that were placed should be removed sometime between day 3 and 5.  This can be done here at urgent care or a family doctor's office..  The area can get wet but not fully immersed underwater.  No scrubbing.  If you really want to clean it you can apply a half-and-half hydrogen peroxide solution with water on a Q-tip.  You can apply an ointment a couple times a day this could be as simple as Vaseline but could also be an antibiotic ointment if you wish..  Once it is healed please try to avoid prolonged sun exposure use sunscreen.  Gells that have silicone antigens have been shown to reduce scarring and some research.    Whenever you fall you should see your family doctor in the office.  It could be a warning sign that you need rehab or for them to look through your medications to see if there is something they can remove.

## 2021-09-20 NOTE — ED Provider Notes (Signed)
I see the patient in signout from Dr. Randal Buba, briefly the patient is a 76 year old male who had unintentionally overdosed on his Viagra.  Plan to observe until 8 AM attempt ambulate.  Reassess.  Patient was able to ambulate without issue.  No hypotension.  We will discharge the patient home per plan.  Discussed results with the patient and family.  Given instructions on local wound care.   Deno Etienne, DO 09/20/21 [phone]

## 2021-09-20 NOTE — ED Provider Notes (Signed)
  [place] EMERGENCY DEPARTMENT Provider Note      Procedures .Marland KitchenLaceration Repair  Date/Time: 09/20/2021 5:11 AM  Performed by: Montine Circle, PA-C Authorized by: Montine Circle, PA-C   Consent:    Consent obtained:  Verbal   Consent given by:  Patient   Risks discussed:  Infection, need for additional repair, pain, poor cosmetic result and poor wound healing   Alternatives discussed:  No treatment and delayed treatment Universal protocol:    Procedure explained and questions answered to patient or proxy's satisfaction: yes     Relevant documents present and verified: yes     Test results available: yes     Imaging studies available: yes     Required blood products, implants, devices, and special equipment available: yes     Site/side marked: yes     Immediately prior to procedure, a time out was called: yes     Patient identity confirmed:  Verbally with patient Anesthesia:    Anesthesia method:  Local infiltration   Local anesthetic:  Lidocaine 1% WITH epi Laceration details:    Length (cm):  2 Pre-procedure details:    Preparation:  Patient was prepped and draped in usual sterile fashion and imaging obtained to evaluate for foreign bodies Exploration:    Contaminated: no   Treatment:    Area cleansed with:  Saline   Irrigation solution:  Sterile saline Skin repair:    Repair method:  Sutures   Suture size:  5-0   Suture material:  Prolene   Suture technique:  Running   Number of sutures:  5 Approximation:    Approximation:  Close Repair type:    Repair type:  Simple Post-procedure details:    Dressing:  Open (no dressing)   Procedure completion:  Tolerated well, no immediate complications .Marland KitchenLaceration Repair  Date/Time: 09/20/2021 5:12 AM  Performed by: Montine Circle, PA-C Authorized by: Montine Circle, PA-C   Consent:    Consent obtained:  Verbal   Consent given by:  Patient   Risks discussed:  Infection, need for additional  repair, pain, poor cosmetic result and poor wound healing   Alternatives discussed:  No treatment and delayed treatment Universal protocol:    Procedure explained and questions answered to patient or proxy's satisfaction: yes     Relevant documents present and verified: yes     Test results available: yes     Imaging studies available: yes     Required blood products, implants, devices, and special equipment available: yes     Site/side marked: yes     Immediately prior to procedure, a time out was called: yes     Patient identity confirmed:  Verbally with patient Anesthesia:    Anesthesia method:  Local infiltration   Local anesthetic:  Lidocaine 1% WITH epi Treatment:    Area cleansed with:  Saline   Irrigation solution:  Sterile saline Skin repair:    Repair method:  Sutures   Suture size:  5-0   Suture material:  Prolene   Suture technique:  Running   Number of sutures:  5 Approximation:    Approximation:  Close Repair type:    Repair type:  Simple Post-procedure details:    Dressing:  Open (no dressing)   Procedure completion:  Tolerated well, no immediate complications      Montine Circle, PA-C 09/20/21 [place], April, MD 09/20/21 [id number]

## 2021-09-20 NOTE — ED Notes (Signed)
Witnessed fall occurred after having lost their balance, pt brought back to room 22.

## 2021-09-20 NOTE — ED Notes (Addendum)
Pt wife left bedside to make a phone call. Patient found at the end of the bed trying to get up and leave. Patient was redirected back in bed and covered up with two warm blankets

## 2021-09-20 NOTE — ED Notes (Signed)
Patient in lobby, on spine board and aspen collar in place by waiting room staff.  Patient is alert, oriented and speaking in full sentences in the lobby, bleeding from head laceration above left eye.  No LOC.  Patient is on Xerelto.  Level II trauma initiated at this time.

## 2021-09-20 NOTE — ED Provider Notes (Signed)
[facility] Provider Note   CSN: [id number] Arrival date & time: 09/20/21  0025     History  Chief Complaint  Patient presents with  . Ingestion    [name] [name] is a 76 y.o. male.  The history is provided by the patient and the spouse. The history is limited by the condition of the patient (level 5 caveat dementia).  Ingestion This is a new problem. The current episode started 1 to 2 hours ago. The problem occurs constantly. Pertinent negatives include no chest pain. Nothing aggravates the symptoms. Nothing relieves the symptoms. He has tried nothing for the symptoms. The treatment provided no relief.  Dementia patient who accidentally ingested 400 mg of viagra and poison control wanted a 6 hour observation and then patient fell in waiting room striking head, on xarelto for afib      Home Medications Prior to Admission medications   Medication Sig Start Date End Date Taking? Authorizing Provider  atorvastatin (LIPITOR) 40 MG tablet Take 40 mg by mouth daily.  11/27/15   [provider]  b complex vitamins tablet Take 1 tablet by mouth daily.    [provider]  carbidopa-levodopa (SINEMET IR) 25-250 MG per tablet Take 1 tablet by mouth 2 (two) times daily.     [provider]  divalproex (DEPAKOTE) 250 MG DR tablet Take 1 tablet by mouth daily. 10/28/19   [provider]  docusate sodium (COLACE) 100 MG capsule Take 1 capsule (100 mg total) by mouth 2 (two) times daily. 06/05/17   Swinteck, Aaron Edelman, MD  donepezil (ARICEPT) 10 MG tablet Take 1 tablet (10 mg total) by mouth at bedtime. 08/08/14   Tomi Likens, Adam R, DO  Evolocumab (REPATHA [place]) Inject into the skin every 14 (fourteen) days.    [provider]  fluocinonide gel (LIDEX) 3.97 % Apply 1 application topically 2 (two) times daily.    [provider]  folic acid (FOLVITE) 1 MG tablet Take 1 mg by mouth at bedtime.     [provider]   gabapentin (NEURONTIN) 100 MG capsule Take 200 mg by mouth 2 (two) times daily. 02/02/16   [provider]  gabapentin (NEURONTIN) 300 MG capsule Take 1 capsule by mouth 2 (two) times daily. 10/28/19   [provider]  HYDROcodone-acetaminophen (NORCO/VICODIN) 5-325 MG tablet Take 1-2 tablets by mouth every 4 (four) hours as needed. 06/05/17   Swinteck, Aaron Edelman, MD  lamoTRIgine (LAMICTAL) 150 MG tablet Take 150 mg by mouth 2 (two) times daily.    [provider]  lisinopril (PRINIVIL,ZESTRIL) 10 MG tablet Take 10 mg by mouth daily.     [provider]  memantine (NAMENDA) 10 MG tablet Take 10 mg by mouth 2 (two) times daily.     [provider]  metroNIDAZOLE (METROGEL) 0.75 % gel Apply 1 application topically 2 (two) times daily.    [provider]  ondansetron (ZOFRAN) 4 MG tablet Take 1 tablet (4 mg total) by mouth every 6 (six) hours as needed for nausea. 06/05/17   Swinteck, Aaron Edelman, MD  rivaroxaban (XARELTO) 20 MG TABS tablet Take 20 mg by mouth at bedtime.    [provider]  senna (SENOKOT) 8.6 MG TABS tablet Take 2 tablets (17.2 mg total) by mouth at bedtime. 06/05/17   Swinteck, Aaron Edelman, MD      Allergies    Latex    Review of Systems   Review of Systems  HENT:  Negative for  facial swelling.   Cardiovascular:  Negative for chest pain.  Musculoskeletal:  Negative for neck pain.  Skin:  Positive for wound.  All other systems reviewed and are negative.   Physical Exam Updated Vital Signs BP (!) 147/98   Pulse 82   Temp (!) 97.5 F (36.4 C)   Resp 18   Ht '5\' 9"'$  (1.753 m)   Wt 97.5 kg   SpO2 98%   BMI 31.75 kg/m  Physical Exam Vitals and nursing note reviewed. Exam conducted with a chaperone present.  Constitutional:      General: He is not in acute distress.    Appearance: He is well-developed. He is not diaphoretic.  HENT:     Head: Normocephalic.      Right Ear: Tympanic membrane normal.     Left Ear: Tympanic  membrane normal.     Nose: Nose normal.  Eyes:     Conjunctiva/sclera: Conjunctivae normal.     Pupils: Pupils are equal, round, and reactive to light.  Neck:     Comments: In c collar, non tender Cardiovascular:     Rate and Rhythm: Normal rate and regular rhythm.     Pulses: Normal pulses.     Heart sounds: Normal heart sounds.  Pulmonary:     Effort: Pulmonary effort is normal.     Breath sounds: Normal breath sounds. No wheezing or rales.  Abdominal:     General: Abdomen is flat. Bowel sounds are normal.     Palpations: Abdomen is soft.     Tenderness: There is no abdominal tenderness. There is no guarding or rebound.  Musculoskeletal:        General: Normal range of motion.     Right wrist: Normal. No bony tenderness or snuff box tenderness.     Left wrist: Normal. No bony tenderness or snuff box tenderness.     Right hand: Normal.     Left hand: Normal.     Cervical back: Normal and neck supple.     Thoracic back: Normal.     Lumbar back: Normal.     Right hip: Normal.     Left hip: Normal.     Right ankle: Normal.     Right Achilles Tendon: Normal.     Left ankle: Normal.     Left Achilles Tendon: Normal.     Comments: FROM of BLE, no foreshortening no rotations.    Skin:    General: Skin is warm and dry.     Capillary Refill: Capillary refill takes less than 2 seconds.  Neurological:     General: No focal deficit present.     Mental Status: He is alert.     Deep Tendon Reflexes: Reflexes normal.  Psychiatric:        Mood and Affect: Mood normal.        Behavior: Behavior normal.    ED Results / Procedures / Treatments   Labs (all labs ordered are listed, but only abnormal results are displayed) Results for orders placed or performed during the hospital encounter of 09/20/21  Comprehensive metabolic panel  Result Value Ref Range   Sodium 138 135 - 145 mmol/L   Potassium 3.8 3.5 - 5.1 mmol/L   Chloride 103 98 - 111 mmol/L   CO2 26 22 - 32 mmol/L    Glucose, Bld 129 (H) 70 - 99 mg/dL   BUN 22 8 - 23 mg/dL   Creatinine, Ser 1.42 (H) 0.61 - 1.24 mg/dL   Calcium  10.5 (H) 8.9 - 10.3 mg/dL   Total Protein 6.5 6.5 - 8.1 g/dL   Albumin 3.7 3.5 - 5.0 g/dL   AST 26 15 - 41 U/L   ALT 25 0 - 44 U/L   Alkaline Phosphatase 74 38 - 126 U/L   Total Bilirubin 0.4 0.3 - 1.2 mg/dL   GFR, Estimated 51 (L) >60 mL/min   Anion gap 9 5 - 15  Salicylate level  Result Value Ref Range   Salicylate Lvl <1.6 (L) 7.0 - 30.0 mg/dL  Acetaminophen level  Result Value Ref Range   Acetaminophen (Tylenol), Serum <10 (L) 10 - 30 ug/mL  Ethanol  Result Value Ref Range   Alcohol, Ethyl (B) <10 <10 mg/dL  CBC WITH DIFFERENTIAL  Result Value Ref Range   WBC 5.9 4.0 - 10.5 K/uL   RBC 3.93 (L) 4.22 - 5.81 MIL/uL   Hemoglobin 12.4 (L) 13.0 - 17.0 g/dL   HCT 37.8 (L) 39.0 - 52.0 %   MCV 96.2 80.0 - 100.0 fL   MCH 31.6 26.0 - 34.0 pg   MCHC 32.8 30.0 - 36.0 g/dL   RDW 13.7 11.5 - 15.5 %   Platelets 129 (L) 150 - 400 K/uL   nRBC 0.0 0.0 - 0.2 %   Neutrophils Relative % 49 %   Neutro Abs 2.9 1.7 - 7.7 K/uL   Lymphocytes Relative 35 %   Lymphs Abs 2.0 0.7 - 4.0 K/uL   Monocytes Relative 10 %   Monocytes Absolute 0.6 0.1 - 1.0 K/uL   Eosinophils Relative 4 %   Eosinophils Absolute 0.3 0.0 - 0.5 K/uL   Basophils Relative 1 %   Basophils Absolute 0.0 0.0 - 0.1 K/uL   Immature Granulocytes 1 %   Abs Immature Granulocytes 0.04 0.00 - 0.07 K/uL  Valproic acid level  Result Value Ref Range   Valproic Acid Lvl 44 (L) 50.0 - 100.0 ug/mL   CT Head Wo Contrast  Result Date: 09/20/2021 CLINICAL DATA:  Fall, trauma. EXAM: CT HEAD WITHOUT CONTRAST CT CERVICAL SPINE WITHOUT CONTRAST TECHNIQUE: Multidetector CT imaging of the head and cervical spine was performed following the standard protocol without intravenous contrast. Multiplanar CT image reconstructions of the cervical spine were also generated. RADIATION DOSE REDUCTION: This exam was performed according to the  departmental dose-optimization program which includes automated exposure control, adjustment of the mA and/or kV according to patient size and/or use of iterative reconstruction technique. COMPARISON:  02/10/2016. FINDINGS: CT HEAD FINDINGS Brain: No acute intracranial hemorrhage, midline shift or mass effect. No extra-axial fluid collection. Generalized atrophy is noted. Old infarcts are present in the frontal and parietal lobes on the right. Mild periventricular white matter hypodensities are present bilaterally. No hydrocephalus. Vascular: No hyperdense vessel or unexpected calcification. Skull: Normal. Negative for fracture or focal lesion. Sinuses/Orbits: Mucosal thickening is present in the ethmoid air cells and left maxillary sinus. No acute orbital abnormality. No acute orbital abnormality. Other: A scalp hematoma is noted over the frontal bone and orbit on the left. CT CERVICAL SPINE FINDINGS Alignment: There is mild anterolisthesis at C4-C5. Skull base and vertebrae: No acute fracture. No primary bone lesion or focal pathologic process. Soft tissues and spinal canal: No prevertebral fluid or swelling. No visible canal hematoma. Disc levels: Multilevel intervertebral disc space narrowing, uncovertebral and disc osteophyte formation and facet arthropathy bilaterally resulting in mild spinal canal stenosis. Upper chest: Negative. Other: Carotid artery calcification. IMPRESSION: 1. No acute intracranial process. 2. Atrophy with chronic microvascular  ischemic changes. 3. Old infarcts in the frontal and parietal lobes on the right. 4. Multilevel degenerative changes in the cervical spine without evidence of acute fracture. Electronically Signed   By: Brett Fairy M.D.   On: 09/20/2021 04:50   CT Cervical Spine Wo Contrast  Result Date: 09/20/2021 CLINICAL DATA:  Fall, trauma. EXAM: CT HEAD WITHOUT CONTRAST CT CERVICAL SPINE WITHOUT CONTRAST TECHNIQUE: Multidetector CT imaging of the head and cervical spine  was performed following the standard protocol without intravenous contrast. Multiplanar CT image reconstructions of the cervical spine were also generated. RADIATION DOSE REDUCTION: This exam was performed according to the departmental dose-optimization program which includes automated exposure control, adjustment of the mA and/or kV according to patient size and/or use of iterative reconstruction technique. COMPARISON:  02/10/2016. FINDINGS: CT HEAD FINDINGS Brain: No acute intracranial hemorrhage, midline shift or mass effect. No extra-axial fluid collection. Generalized atrophy is noted. Old infarcts are present in the frontal and parietal lobes on the right. Mild periventricular white matter hypodensities are present bilaterally. No hydrocephalus. Vascular: No hyperdense vessel or unexpected calcification. Skull: Normal. Negative for fracture or focal lesion. Sinuses/Orbits: Mucosal thickening is present in the ethmoid air cells and left maxillary sinus. No acute orbital abnormality. No acute orbital abnormality. Other: A scalp hematoma is noted over the frontal bone and orbit on the left. CT CERVICAL SPINE FINDINGS Alignment: There is mild anterolisthesis at C4-C5. Skull base and vertebrae: No acute fracture. No primary bone lesion or focal pathologic process. Soft tissues and spinal canal: No prevertebral fluid or swelling. No visible canal hematoma. Disc levels: Multilevel intervertebral disc space narrowing, uncovertebral and disc osteophyte formation and facet arthropathy bilaterally resulting in mild spinal canal stenosis. Upper chest: Negative. Other: Carotid artery calcification. IMPRESSION: 1. No acute intracranial process. 2. Atrophy with chronic microvascular ischemic changes. 3. Old infarcts in the frontal and parietal lobes on the right. 4. Multilevel degenerative changes in the cervical spine without evidence of acute fracture. Electronically Signed   By: Brett Fairy M.D.   On: 09/20/2021 04:50    DG Chest Portable 1 View  Result Date: 09/20/2021 CLINICAL DATA:  Trauma, fall. EXAM: PORTABLE CHEST 1 VIEW COMPARISON:  None Available. FINDINGS: The heart is enlarged the mediastinal contour is within normal limits. No consolidation, effusion, or pneumothorax. No acute osseous abnormality. IMPRESSION: Cardiomegaly with no active disease. Electronically Signed   By: Brett Fairy M.D.   On: 09/20/2021 04:25     EKG None  Radiology CT Head Wo Contrast  Result Date: 09/20/2021 CLINICAL DATA:  Fall, trauma. EXAM: CT HEAD WITHOUT CONTRAST CT CERVICAL SPINE WITHOUT CONTRAST TECHNIQUE: Multidetector CT imaging of the head and cervical spine was performed following the standard protocol without intravenous contrast. Multiplanar CT image reconstructions of the cervical spine were also generated. RADIATION DOSE REDUCTION: This exam was performed according to the departmental dose-optimization program which includes automated exposure control, adjustment of the mA and/or kV according to patient size and/or use of iterative reconstruction technique. COMPARISON:  02/10/2016. FINDINGS: CT HEAD FINDINGS Brain: No acute intracranial hemorrhage, midline shift or mass effect. No extra-axial fluid collection. Generalized atrophy is noted. Old infarcts are present in the frontal and parietal lobes on the right. Mild periventricular white matter hypodensities are present bilaterally. No hydrocephalus. Vascular: No hyperdense vessel or unexpected calcification. Skull: Normal. Negative for fracture or focal lesion. Sinuses/Orbits: Mucosal thickening is present in the ethmoid air cells and left maxillary sinus. No acute orbital abnormality. No acute orbital abnormality.  Other: A scalp hematoma is noted over the frontal bone and orbit on the left. CT CERVICAL SPINE FINDINGS Alignment: There is mild anterolisthesis at C4-C5. Skull base and vertebrae: No acute fracture. No primary bone lesion or focal pathologic process. Soft  tissues and spinal canal: No prevertebral fluid or swelling. No visible canal hematoma. Disc levels: Multilevel intervertebral disc space narrowing, uncovertebral and disc osteophyte formation and facet arthropathy bilaterally resulting in mild spinal canal stenosis. Upper chest: Negative. Other: Carotid artery calcification. IMPRESSION: 1. No acute intracranial process. 2. Atrophy with chronic microvascular ischemic changes. 3. Old infarcts in the frontal and parietal lobes on the right. 4. Multilevel degenerative changes in the cervical spine without evidence of acute fracture. Electronically Signed   By: Brett Fairy M.D.   On: 09/20/2021 04:50   CT Cervical Spine Wo Contrast  Result Date: 09/20/2021 CLINICAL DATA:  Fall, trauma. EXAM: CT HEAD WITHOUT CONTRAST CT CERVICAL SPINE WITHOUT CONTRAST TECHNIQUE: Multidetector CT imaging of the head and cervical spine was performed following the standard protocol without intravenous contrast. Multiplanar CT image reconstructions of the cervical spine were also generated. RADIATION DOSE REDUCTION: This exam was performed according to the departmental dose-optimization program which includes automated exposure control, adjustment of the mA and/or kV according to patient size and/or use of iterative reconstruction technique. COMPARISON:  02/10/2016. FINDINGS: CT HEAD FINDINGS Brain: No acute intracranial hemorrhage, midline shift or mass effect. No extra-axial fluid collection. Generalized atrophy is noted. Old infarcts are present in the frontal and parietal lobes on the right. Mild periventricular white matter hypodensities are present bilaterally. No hydrocephalus. Vascular: No hyperdense vessel or unexpected calcification. Skull: Normal. Negative for fracture or focal lesion. Sinuses/Orbits: Mucosal thickening is present in the ethmoid air cells and left maxillary sinus. No acute orbital abnormality. No acute orbital abnormality. Other: A scalp hematoma is noted  over the frontal bone and orbit on the left. CT CERVICAL SPINE FINDINGS Alignment: There is mild anterolisthesis at C4-C5. Skull base and vertebrae: No acute fracture. No primary bone lesion or focal pathologic process. Soft tissues and spinal canal: No prevertebral fluid or swelling. No visible canal hematoma. Disc levels: Multilevel intervertebral disc space narrowing, uncovertebral and disc osteophyte formation and facet arthropathy bilaterally resulting in mild spinal canal stenosis. Upper chest: Negative. Other: Carotid artery calcification. IMPRESSION: 1. No acute intracranial process. 2. Atrophy with chronic microvascular ischemic changes. 3. Old infarcts in the frontal and parietal lobes on the right. 4. Multilevel degenerative changes in the cervical spine without evidence of acute fracture. Electronically Signed   By: Brett Fairy M.D.   On: 09/20/2021 04:50   DG Chest Portable 1 View  Result Date: 09/20/2021 CLINICAL DATA:  Trauma, fall. EXAM: PORTABLE CHEST 1 VIEW COMPARISON:  None Available. FINDINGS: The heart is enlarged the mediastinal contour is within normal limits. No consolidation, effusion, or pneumothorax. No acute osseous abnormality. IMPRESSION: Cardiomegaly with no active disease. Electronically Signed   By: Brett Fairy M.D.   On: 09/20/2021 04:25    Procedures Procedures    Medications Ordered in ED Medications  Tdap (BOOSTRIX) injection 0.5 mL (has no administration in time range)  lidocaine-EPINEPHrine (XYLOCAINE W/EPI) 2 %-1:200000 (PF) injection 20 mL (has no administration in time range)    ED Course/ Medical Decision Making/ A&P                           Medical Decision Making Fall on thinners after taking 400  mg of viagra   Problems Addressed: Accidental ingestion of substance, initial encounter:    Details: observation until 8 am Facial laceration, initial encounter:    Details: sutured in the ED suture removal in 5 days at urgent care Fall, initial  encounter: acute illness or injury    Details: ruled out for brain or cervical spine trauma  Amount and/or Complexity of Data Reviewed Independent Historian: spouse    Details: see above External Data Reviewed: notes.    Details: previous notes reviewed Labs: ordered.    Details: All labs reviewed: sodium normal 138 and normal potassium 3.8 elevated creatinine 1.42 and normal LFTS.  negative tylenol and salicylate.  normal white count 5.9, low hemoglobin 12.4.  platelets low 129k.  Low valproic acid 44 Radiology: ordered and independent interpretation performed.    Details: negative head and C spine CT by me  Risk Prescription drug management.    Final Clinical Impression(s) / ED Diagnoses Final diagnoses:  Accidental ingestion of substance, initial encounter  Fall, initial encounter  Dementia without behavioral disturbance, psychotic disturbance, mood disturbance, or anxiety, unspecified dementia severity, unspecified dementia type ([place])   Observation until 8 am, PO challenge and ambulate prior to D/c signed out am attending Rx / DC Orders ED Discharge Orders     None         Joci Dress, MD 09/20/21 6720

## 2021-09-20 NOTE — ED Triage Notes (Signed)
Pt brought to ED by wife stating that he accidentally took '400mg'$  of Viagra instead of '100mg'$  that is prescribed. Wife states pt has hx of alzheimer's.

## 2021-09-25 DIAGNOSIS — R262 Difficulty in walking, not elsewhere classified: Secondary | ICD-10-CM | POA: Diagnosis not present

## 2021-09-25 DIAGNOSIS — G2581 Restless legs syndrome: Secondary | ICD-10-CM | POA: Diagnosis not present

## 2021-09-25 DIAGNOSIS — H532 Diplopia: Secondary | ICD-10-CM | POA: Diagnosis not present

## 2021-09-25 DIAGNOSIS — R251 Tremor, unspecified: Secondary | ICD-10-CM | POA: Diagnosis not present

## 2021-09-25 DIAGNOSIS — F39 Unspecified mood [affective] disorder: Secondary | ICD-10-CM | POA: Diagnosis not present

## 2021-09-25 DIAGNOSIS — R413 Other amnesia: Secondary | ICD-10-CM | POA: Diagnosis not present

## 2021-09-26 DIAGNOSIS — Z4802 Encounter for removal of sutures: Secondary | ICD-10-CM | POA: Diagnosis not present

## 2021-09-26 DIAGNOSIS — Z9181 History of falling: Secondary | ICD-10-CM | POA: Diagnosis not present

## 2021-09-27 DIAGNOSIS — G309 Alzheimer's disease, unspecified: Secondary | ICD-10-CM | POA: Diagnosis not present

## 2021-09-27 DIAGNOSIS — H2513 Age-related nuclear cataract, bilateral: Secondary | ICD-10-CM | POA: Diagnosis not present

## 2021-09-27 DIAGNOSIS — H532 Diplopia: Secondary | ICD-10-CM | POA: Diagnosis not present

## 2021-09-27 DIAGNOSIS — Z9181 History of falling: Secondary | ICD-10-CM | POA: Diagnosis not present

## 2021-09-27 DIAGNOSIS — F0280 Dementia in other diseases classified elsewhere without behavioral disturbance: Secondary | ICD-10-CM | POA: Diagnosis not present

## 2021-10-01 DIAGNOSIS — N529 Male erectile dysfunction, unspecified: Secondary | ICD-10-CM | POA: Diagnosis not present

## 2021-10-01 DIAGNOSIS — S0181XA Laceration without foreign body of other part of head, initial encounter: Secondary | ICD-10-CM | POA: Diagnosis not present

## 2021-10-01 DIAGNOSIS — Z79899 Other long term (current) drug therapy: Secondary | ICD-10-CM | POA: Diagnosis not present

## 2021-10-01 DIAGNOSIS — F0390 Unspecified dementia without behavioral disturbance: Secondary | ICD-10-CM | POA: Diagnosis not present

## 2021-10-04 DIAGNOSIS — S0512XA Contusion of eyeball and orbital tissues, left eye, initial encounter: Secondary | ICD-10-CM | POA: Diagnosis not present

## 2021-10-14 DIAGNOSIS — Z96652 Presence of left artificial knee joint: Secondary | ICD-10-CM | POA: Diagnosis not present

## 2021-10-14 DIAGNOSIS — Z471 Aftercare following joint replacement surgery: Secondary | ICD-10-CM | POA: Diagnosis not present

## 2021-10-14 DIAGNOSIS — M1711 Unilateral primary osteoarthritis, right knee: Secondary | ICD-10-CM | POA: Diagnosis not present

## 2021-10-29 DIAGNOSIS — T1490XS Injury, unspecified, sequela: Secondary | ICD-10-CM | POA: Diagnosis not present

## 2021-10-29 DIAGNOSIS — F0283 Dementia in other diseases classified elsewhere, unspecified severity, with mood disturbance: Secondary | ICD-10-CM | POA: Diagnosis not present

## 2021-10-29 DIAGNOSIS — H2513 Age-related nuclear cataract, bilateral: Secondary | ICD-10-CM | POA: Diagnosis not present

## 2021-10-29 DIAGNOSIS — Z8673 Personal history of transient ischemic attack (TIA), and cerebral infarction without residual deficits: Secondary | ICD-10-CM | POA: Diagnosis not present

## 2021-10-29 DIAGNOSIS — H532 Diplopia: Secondary | ICD-10-CM | POA: Diagnosis not present

## 2021-10-29 DIAGNOSIS — G309 Alzheimer's disease, unspecified: Secondary | ICD-10-CM | POA: Diagnosis not present

## 2021-11-24 ENCOUNTER — Emergency Department (HOSPITAL_COMMUNITY): Payer: Medicare PPO

## 2021-11-24 ENCOUNTER — Encounter (HOSPITAL_COMMUNITY): Payer: Self-pay

## 2021-11-24 ENCOUNTER — Other Ambulatory Visit: Payer: Self-pay

## 2021-11-24 ENCOUNTER — Inpatient Hospital Stay (HOSPITAL_COMMUNITY)
Admission: EM | Admit: 2021-11-24 | Discharge: 2021-11-25 | DRG: 261 | Disposition: A | Discharge status: Home / Self Care | Payer: Medicare PPO | Attending: Family Medicine | Admitting: Family Medicine

## 2021-11-24 DIAGNOSIS — G8929 Other chronic pain: Secondary | ICD-10-CM | POA: Diagnosis not present

## 2021-11-24 DIAGNOSIS — Z79899 Other long term (current) drug therapy: Secondary | ICD-10-CM | POA: Diagnosis not present

## 2021-11-24 DIAGNOSIS — I495 Sick sinus syndrome: Secondary | ICD-10-CM | POA: Diagnosis not present

## 2021-11-24 DIAGNOSIS — I442 Atrioventricular block, complete: Secondary | ICD-10-CM | POA: Diagnosis present

## 2021-11-24 DIAGNOSIS — Z96652 Presence of left artificial knee joint: Secondary | ICD-10-CM | POA: Diagnosis present

## 2021-11-24 DIAGNOSIS — R7989 Other specified abnormal findings of blood chemistry: Secondary | ICD-10-CM | POA: Diagnosis present

## 2021-11-24 DIAGNOSIS — Z79891 Long term (current) use of opiate analgesic: Secondary | ICD-10-CM

## 2021-11-24 DIAGNOSIS — R55 Syncope and collapse: Secondary | ICD-10-CM

## 2021-11-24 DIAGNOSIS — Z7901 Long term (current) use of anticoagulants: Secondary | ICD-10-CM | POA: Diagnosis not present

## 2021-11-24 DIAGNOSIS — E785 Hyperlipidemia, unspecified: Secondary | ICD-10-CM | POA: Diagnosis not present

## 2021-11-24 DIAGNOSIS — F319 Bipolar disorder, unspecified: Secondary | ICD-10-CM | POA: Diagnosis present

## 2021-11-24 DIAGNOSIS — G9001 Carotid sinus syncope: Secondary | ICD-10-CM | POA: Diagnosis present

## 2021-11-24 DIAGNOSIS — R32 Unspecified urinary incontinence: Secondary | ICD-10-CM | POA: Diagnosis present

## 2021-11-24 DIAGNOSIS — Z96641 Presence of right artificial hip joint: Secondary | ICD-10-CM | POA: Diagnosis present

## 2021-11-24 DIAGNOSIS — Z87891 Personal history of nicotine dependence: Secondary | ICD-10-CM | POA: Diagnosis not present

## 2021-11-24 DIAGNOSIS — G309 Alzheimer's disease, unspecified: Secondary | ICD-10-CM | POA: Diagnosis not present

## 2021-11-24 DIAGNOSIS — I4821 Permanent atrial fibrillation: Secondary | ICD-10-CM | POA: Diagnosis not present

## 2021-11-24 DIAGNOSIS — M549 Dorsalgia, unspecified: Secondary | ICD-10-CM | POA: Diagnosis present

## 2021-11-24 DIAGNOSIS — F0153 Vascular dementia, unspecified severity, with mood disturbance: Secondary | ICD-10-CM | POA: Diagnosis present

## 2021-11-24 DIAGNOSIS — Z91040 Latex allergy status: Secondary | ICD-10-CM | POA: Diagnosis not present

## 2021-11-24 DIAGNOSIS — I10 Essential (primary) hypertension: Secondary | ICD-10-CM | POA: Diagnosis present

## 2021-11-24 DIAGNOSIS — Z8673 Personal history of transient ischemic attack (TIA), and cerebral infarction without residual deficits: Secondary | ICD-10-CM

## 2021-11-24 DIAGNOSIS — R001 Bradycardia, unspecified: Secondary | ICD-10-CM | POA: Diagnosis present

## 2021-11-24 DIAGNOSIS — G4733 Obstructive sleep apnea (adult) (pediatric): Secondary | ICD-10-CM | POA: Diagnosis present

## 2021-11-24 DIAGNOSIS — R231 Pallor: Secondary | ICD-10-CM | POA: Diagnosis not present

## 2021-11-24 DIAGNOSIS — G250 Essential tremor: Secondary | ICD-10-CM | POA: Diagnosis not present

## 2021-11-24 DIAGNOSIS — R0902 Hypoxemia: Secondary | ICD-10-CM | POA: Diagnosis not present

## 2021-11-24 DIAGNOSIS — I959 Hypotension, unspecified: Secondary | ICD-10-CM | POA: Diagnosis not present

## 2021-11-24 LAB — COMPREHENSIVE METABOLIC PANEL
ALT: 6 U/L (ref 0–44)
AST: 21 U/L (ref 15–41)
Albumin: 3.1 g/dL — ABNORMAL LOW (ref 3.5–5.0)
Alkaline Phosphatase: 67 U/L (ref 38–126)
Anion gap: 3 — ABNORMAL LOW (ref 5–15)
BUN: 18 mg/dL (ref 8–23)
CO2: 30 mmol/L (ref 22–32)
Calcium: 9.7 mg/dL (ref 8.9–10.3)
Chloride: 108 mmol/L (ref 98–111)
Creatinine, Ser: 1.35 mg/dL — ABNORMAL HIGH (ref 0.61–1.24)
GFR, Estimated: 54 mL/min — ABNORMAL LOW (ref >60)
Glucose, Bld: 143 mg/dL — ABNORMAL HIGH (ref 70–99)
Potassium: 3.8 mmol/L (ref 3.5–5.1)
Sodium: 141 mmol/L (ref 135–145)
Total Bilirubin: 0.7 mg/dL (ref 0.3–1.2)
Total Protein: 5.8 g/dL — ABNORMAL LOW (ref 6.5–8.1)

## 2021-11-24 LAB — CBC WITH DIFFERENTIAL/PLATELET
Abs Immature Granulocytes: 0.02 10*3/uL (ref 0.00–0.07)
Basophils Absolute: 0 10*3/uL (ref 0.0–0.1)
Basophils Relative: 1 %
Eosinophils Absolute: 0.2 10*3/uL (ref 0.0–0.5)
Eosinophils Relative: 3 %
HCT: 34.7 % — ABNORMAL LOW (ref 39.0–52.0)
Hemoglobin: 11.2 g/dL — ABNORMAL LOW (ref 13.0–17.0)
Immature Granulocytes: 0 %
Lymphocytes Relative: 20 %
Lymphs Abs: 0.9 10*3/uL (ref 0.7–4.0)
MCH: 32.4 pg (ref 26.0–34.0)
MCHC: 32.3 g/dL (ref 30.0–36.0)
MCV: 100.3 fL — ABNORMAL HIGH (ref 80.0–100.0)
Monocytes Absolute: 0.3 10*3/uL (ref 0.1–1.0)
Monocytes Relative: 7 %
Neutro Abs: 3.2 10*3/uL (ref 1.7–7.7)
Neutrophils Relative %: 69 %
Platelets: 116 10*3/uL — ABNORMAL LOW (ref 150–400)
RBC: 3.46 MIL/uL — ABNORMAL LOW (ref 4.22–5.81)
RDW: 13.3 % (ref 11.5–15.5)
WBC: 4.7 10*3/uL (ref 4.0–10.5)
nRBC: 0 % (ref 0.0–0.2)

## 2021-11-24 LAB — TROPONIN I (HIGH SENSITIVITY)
Troponin I (High Sensitivity): 8 ng/L (ref <18)
Troponin I (High Sensitivity): 10 ng/L (ref <18)

## 2021-11-24 LAB — MAGNESIUM: Magnesium: 1.9 mg/dL (ref 1.7–2.4)

## 2021-11-24 LAB — TSH: TSH: 1.759 u[IU]/mL (ref 0.350–4.500)

## 2021-11-24 LAB — T4, FREE: Free T4: 0.88 ng/dL (ref 0.61–1.12)

## 2021-11-24 LAB — D-DIMER, QUANTITATIVE: D-Dimer, Quant: 0.34 ug/mL-FEU (ref 0.00–0.50)

## 2021-11-24 MED ORDER — SODIUM CHLORIDE 0.9% FLUSH
3.0000 mL | Freq: Two times a day (BID) | INTRAVENOUS | Status: DC
  Administered 2021-11-24 – 2021-11-25 (×2): 3 mL via INTRAVENOUS

## 2021-11-24 MED ORDER — HYDROCODONE-ACETAMINOPHEN 5-325 MG PO TABS: 1.0000 | ORAL_TABLET | ORAL | Status: DC | PRN

## 2021-11-24 MED ORDER — DIVALPROEX SODIUM 250 MG PO DR TAB
250.0000 mg | DELAYED_RELEASE_TABLET | Freq: Every day | ORAL | Status: DC
  Administered 2021-11-24 – 2021-11-25 (×2): 250 mg via ORAL
  Filled 2021-11-24 (×2): qty 1

## 2021-11-24 MED ORDER — ATROPINE SULFATE 0.4 MG/ML IV SOLN: 0.2000 mg | INTRAVENOUS | Status: DC | PRN

## 2021-11-24 MED ORDER — POTASSIUM CHLORIDE CRYS ER 20 MEQ PO TBCR
40.0000 meq | EXTENDED_RELEASE_TABLET | Freq: Once | ORAL | Status: AC
  Administered 2021-11-24: 40 meq via ORAL
  Filled 2021-11-24: qty 2

## 2021-11-24 MED ORDER — SENNA 8.6 MG PO TABS
2.0000 | ORAL_TABLET | Freq: Every day | ORAL | Status: DC
  Administered 2021-11-24: 17.2 mg via ORAL
  Filled 2021-11-24: qty 2

## 2021-11-24 MED ORDER — GABAPENTIN 100 MG PO CAPS
200.0000 mg | ORAL_CAPSULE | Freq: Two times a day (BID) | ORAL | Status: DC
  Administered 2021-11-24 – 2021-11-25 (×2): 200 mg via ORAL
  Filled 2021-11-24 (×2): qty 2

## 2021-11-24 MED ORDER — DOCUSATE SODIUM 100 MG PO CAPS
100.0000 mg | ORAL_CAPSULE | Freq: Two times a day (BID) | ORAL | Status: DC
  Administered 2021-11-24 – 2021-11-25 (×2): 100 mg via ORAL
  Filled 2021-11-24 (×2): qty 1

## 2021-11-24 MED ORDER — DONEPEZIL HCL 10 MG PO TABS
10.0000 mg | ORAL_TABLET | Freq: Every day | ORAL | Status: DC
  Administered 2021-11-24: 10 mg via ORAL
  Filled 2021-11-24: qty 1

## 2021-11-24 MED ORDER — RIVAROXABAN 20 MG PO TABS: 20.0000 mg | ORAL_TABLET | Freq: Every day | ORAL | Status: DC

## 2021-11-24 MED ORDER — LAMOTRIGINE 150 MG PO TABS
150.0000 mg | ORAL_TABLET | Freq: Two times a day (BID) | ORAL | Status: DC
  Administered 2021-11-24 – 2021-11-25 (×2): 150 mg via ORAL
  Filled 2021-11-24: qty 6
  Filled 2021-11-24: qty 1
  Filled 2021-11-24: qty 6
  Filled 2021-11-24: qty 1

## 2021-11-24 MED ORDER — HYDRALAZINE HCL 25 MG PO TABS
25.0000 mg | ORAL_TABLET | Freq: Four times a day (QID) | ORAL | Status: DC | PRN
  Administered 2021-11-24: 25 mg via ORAL
  Filled 2021-11-24: qty 1

## 2021-11-24 MED ORDER — ATORVASTATIN CALCIUM 40 MG PO TABS
40.0000 mg | ORAL_TABLET | Freq: Every day | ORAL | Status: DC
  Administered 2021-11-24: 40 mg via ORAL
  Filled 2021-11-24: qty 1

## 2021-11-24 MED ORDER — CARBIDOPA-LEVODOPA 25-250 MG PO TABS
1.0000 | ORAL_TABLET | Freq: Two times a day (BID) | ORAL | Status: DC
  Administered 2021-11-25 (×2): 1 via ORAL
  Filled 2021-11-24 (×4): qty 1

## 2021-11-24 MED ORDER — MEMANTINE HCL 10 MG PO TABS
10.0000 mg | ORAL_TABLET | Freq: Two times a day (BID) | ORAL | Status: DC
  Administered 2021-11-24 – 2021-11-25 (×2): 10 mg via ORAL
  Filled 2021-11-24 (×3): qty 1

## 2021-11-24 MED ORDER — ONDANSETRON HCL 4 MG PO TABS: 4.0000 mg | ORAL_TABLET | Freq: Four times a day (QID) | ORAL | Status: DC | PRN

## 2021-11-24 MED ORDER — FLUOCINONIDE 0.05 % EX GEL
1.0000 | Freq: Two times a day (BID) | CUTANEOUS | Status: DC
  Administered 2021-11-25: 1 via TOPICAL
  Filled 2021-11-24: qty 15

## 2021-11-24 NOTE — Progress Notes (Signed)
Pt continues to attempt OOB without assist. Gait is unsteady. Pt is completely unaware he is at the hospital and states he wants to go home, also attempting to "Go find [name]". Pt is cooperative at this moment however he is  increasingly getting more agitated. Early Release/head start belt placed on patient for safety. Floor mats placed at bedside. WIll continue to monitor. Jessie Foot, RN

## 2021-11-24 NOTE — Progress Notes (Signed)
Pt has history of dementia and is oriented to self only. Prior to patients wife leaving for the evening she stated "keep a close eye on him-he will get up and try to leave". Pt has since attempted OOB without calling for assist x2. Pt is cooperative but does not reorient well. Bed alarm on and fall mats placed for patient safety. Will continue to monitor. Jessie Foot, RN

## 2021-11-24 NOTE — ED Triage Notes (Signed)
Per Ems pt had syncope episode. Urinated on self. Hx of dementia.   HR 28-50 with EMS Alert to verbal  Pale, cool '1mg'$  Atropine, 535m of fluids BP 126/68 HR 68 CBG 160 96% on 3L (RA baseline)  18G LAC

## 2021-11-24 NOTE — Progress Notes (Signed)
ANTICOAGULATION CONSULT NOTE - Initial Consult  Pharmacy Consult for Heparin Indication: atrial fibrillation  Allergies  Allergen Reactions   Latex Rash and Other (See Comments)    Reaction to knee wraps    Patient Measurements: Height: '5\' 9"'$  (175.3 cm) Weight: 97.5 kg (215 lb) IBW/kg (Calculated) : 70.7 Heparin Dosing Weight: 91.1 kg  Vital Signs: Temp: 97.6 F (36.4 C) (09/24 1222) Temp Source: Oral (09/24 1222) BP: 142/77 (09/24 1445) Pulse Rate: 54 (09/24 1445)  Labs: Recent Labs    11/24/21 1305 11/24/21 1524  HGB 11.2*  --   HCT 34.7*  --   PLT 116*  --   CREATININE 1.35*  --   TROPONINIHS 8 10    Estimated Creatinine Clearance: 51.9 mL/min (A) (by C-G formula based on SCr of 1.35 mg/dL (H)).   Medical History: Past Medical History:  Diagnosis Date   Alzheimer disease ([place])    Anxiety    Arthritis    Atrial fibrillation ([facility])    Chronic   Bipolar disorder ([facility])    Chronic low back pain    Dementia ([facility])    Depression    Dizziness    Dysrhythmia    a fib   ED (erectile dysfunction)    External hemorrhoids    Fatigue    HLD (hyperlipidemia)    HTN (hypertension)    Hypogonadism in male    Lipoma of skin    Memory loss    Mixed Alzheimer's and vascular dementia ([facility])    Poor balance    Restless legs syndrome (RLS)    Sleep apnea    cpap   Stroke ([place]) 1991   Tremor     Medications:  (Not in a hospital admission)  Scheduled:   atorvastatin  40 mg Oral Daily   carbidopa-levodopa  1 tablet Oral BID   divalproex  250 mg Oral Daily   docusate sodium  100 mg Oral BID   donepezil  10 mg Oral QHS   fluocinonide gel  1 Application Topical BID   gabapentin  200 mg Oral BID   lamoTRIgine  150 mg Oral BID   memantine  10 mg Oral BID   potassium chloride  40 mEq Oral Once   senna  2 tablet Oral QHS   sodium chloride flush  3 mL Intravenous Q12H   Infusions:  PRN: atropine, hydrALAZINE, HYDROcodone-acetaminophen,  ondansetron  Assessment: 16 yom with a history of HTN, HLD, AF, CVA, dementa, bipolar, tremor. Patient is presenting with syncope and bradycardia. Heparin per pharmacy consult placed for atrial fibrillation.  Patient is on Xarelto prior to arrival. Last dose 9/24 1000. Will require aPTT monitoring due to likely falsely high anti-Xa level secondary to DOAC use.  Hgb 11.2; plt 116  Goal of Therapy:  Heparin level 0.3-0.7 units/ml aPTT 66-102 seconds Monitor platelets by anticoagulation protocol: Yes   Plan:  Cardiology ok with just holding xarelto overnight without bridging per note. Discussed with [facility] as well. Will hold off on heparin orders for now as plan is for pacemaker placement  9/25 AM.   Patient would be considered therapeutically anticoagulated still from last Xarelto dose (9/24 1000). Pharmacy will f/u in AM to see plan for pacemaker and follow-up plan for anticoagulation.  Lorelei Pont, PharmD, BCPS 11/24/2021 5:12 PM ED Clinical Pharmacist -  [phone]

## 2021-11-24 NOTE — Consult Note (Addendum)
Cardiology Consultation   Patient ID: MCLEAN MOYA MRN: [id number]; DOB: [birth year]  Admit date: 11/24/2021 Date of Consult: 11/24/2021  PCP:  Deland Pretty, [facility] Cardiologist:  Quay Burow, MD   {    Patient Profile:   [name] [name] is a 76 y.o. male with a hx of hypertension, hyperlipidemia, chronic atrial fibrillation, CVA, dementia,  bipolar disorder, tremor, who is being seen 11/24/2021 for the evaluation of syncope and bradycardia at the request of Dr. Mayra Neer.  History of Present Illness:   [name] with above past medical history presented to the ER today complaining of syncope and bradycardia.  Patient has underlying dementia that has progressively worsened over the past few years, not able to offer history.  Per chart review, patient had a syncope episode at church he was found to have low heart rate (28-40s)  by EMS, also noted with urinary incontinence.  He was given 1 mg atropine. Patient had offered no complaints at ER.  His heart rate had improved to 70s.  He follows Dr. Gwenlyn Found outpatient, has chronic atrial fibrillation, on Xarelto for anticoagulation.  Had historical slow ventricular rate in the past by EKG.  Not on AV nodal blocking agent.  Most recent echo from 03/18/2013 with LVEF 50 to 55%, normal wall motion, mild LAE and RAE.  He had no myocardial infarction reported in the past.  Last Myoview was done on 08/31/2012 which was low risk with a suggestion of a small area of anterioapical ischemia.  There was no follow-up after the stress test with cardiology until 2018.  He was last seen 12/01/2019, dementia has progressed, no chest pain or shortness of breath, was maintained on lisinopril for hypertension as well as atorvastatin and Repatha for hyperlipidemia.   Admission diagnostic today revealed elevated creatinine 1.35 with GFR 54.  High sensitive troponin 8.  Hemoglobin 11.2, otherwise CBC differential unremarkable.  D-dimer  negative.  CT head revealed no acute abnormality, Atrophy and chronic small vessel ischemic changes of the white matter. Chronic right frontal and parietal infarcts.  Chest x-ray revealed no acute finding.  EKG Showed atrial fibrillation with ventricular rate of 67 bpm, no acute ST-T changes.  He is afebrile, bradycardic with heart rate 50s, hypertensive with blood pressure up to 152/65 at ED. Cardiology is consulted for further evaluation of syncope and bradycardia.  Upon encounter, he states he is not entirely sure what happened. He believes he must be at the church earlier. He lives with his wife. He is not oriented to time, is able to tell his name and knows he is in the hospital. He recalls he passed out in the past but unable to offer specific history. Wife [name] [name] was called, states patient never passed out in the past, had overdosed medication in Feb2023, always has unsteady gait at baseline. He was moaning and jerking while sitting at church today. She thought he stopped breathing and did not really respond to her. She saw his head was tilting back with gurgling sound while sitting, no cyanosis was noted, he was diaphoretic, her HR was slow at 40s (there were firefighter and physician were in the church).    Past Medical History:  Diagnosis Date   Alzheimer disease ([place])    Anxiety    Arthritis    Atrial fibrillation ([place])    Chronic   Bipolar disorder ([place])    Chronic low back pain    Dementia ([facility])    Depression  Dizziness    Dysrhythmia    a fib   ED (erectile dysfunction)    External hemorrhoids    Fatigue    HLD (hyperlipidemia)    HTN (hypertension)    Hypogonadism in male    Lipoma of skin    Memory loss    Mixed Alzheimer's and vascular dementia ([facility])    Poor balance    Restless legs syndrome (RLS)    Sleep apnea    cpap   Stroke [facility]) 1991   Tremor     Past Surgical History:  Procedure Laterality Date   COLONOSCOPY     KNEE ARTHROPLASTY Left 06/04/2017    Procedure: LEFT TOTAL KNEE ARTHROPLASTY WITH COMPUTER NAVIGATION;  Surgeon: Rod Can, MD;  Location: [facility];  Service: Orthopedics;  Laterality: Left;  NEEDS RNFA   TONSILLECTOMY AND ADENOIDECTOMY     TOTAL HIP ARTHROPLASTY Right 07/31/2016   Procedure: RIGHT TOTAL HIP ARTHROPLASTY ANTERIOR APPROACH;  Surgeon: Rod Can, MD;  Location: [facility];  Service: Orthopedics;  Laterality: Right;  Requesting RNFA     Home Medications:  Prior to Admission medications   Medication Sig Start Date End Date Taking? Authorizing Provider  atorvastatin (LIPITOR) 40 MG tablet Take 40 mg by mouth daily.  11/27/15   [provider]  b complex vitamins tablet Take 1 tablet by mouth daily.    [provider]  carbidopa-levodopa (SINEMET IR) 25-250 MG per tablet Take 1 tablet by mouth 2 (two) times daily.     [provider]  divalproex (DEPAKOTE) 250 MG DR tablet Take 1 tablet by mouth daily. 10/28/19   [provider]  docusate sodium (COLACE) 100 MG capsule Take 1 capsule (100 mg total) by mouth 2 (two) times daily. 06/05/17   Swinteck, Aaron Edelman, MD  donepezil (ARICEPT) 10 MG tablet Take 1 tablet (10 mg total) by mouth at bedtime. 08/08/14   Tomi Likens, Adam R, DO  Evolocumab (REPATHA [place]) Inject into the skin every 14 (fourteen) days.    [provider]  fluocinonide gel (LIDEX) 3.71 % Apply 1 application topically 2 (two) times daily.    [provider]  folic acid (FOLVITE) 1 MG tablet Take 1 mg by mouth at bedtime.     [provider]  gabapentin (NEURONTIN) 100 MG capsule Take 200 mg by mouth 2 (two) times daily. 02/02/16   [provider]  gabapentin (NEURONTIN) 300 MG capsule Take 1 capsule by mouth 2 (two) times daily. 10/28/19   [provider]  HYDROcodone-acetaminophen (NORCO/VICODIN) 5-325 MG tablet Take 1-2 tablets by mouth every 4 (four) hours as needed. 06/05/17   Swinteck, Aaron Edelman, MD  lamoTRIgine (LAMICTAL) 150 MG tablet Take 150  mg by mouth 2 (two) times daily.    [provider]  lisinopril (PRINIVIL,ZESTRIL) 10 MG tablet Take 10 mg by mouth daily.     [provider]  memantine (NAMENDA) 10 MG tablet Take 10 mg by mouth 2 (two) times daily.     [provider]  metroNIDAZOLE (METROGEL) 0.75 % gel Apply 1 application topically 2 (two) times daily.    [provider]  ondansetron (ZOFRAN) 4 MG tablet Take 1 tablet (4 mg total) by mouth every 6 (six) hours as needed for nausea. 06/05/17   Swinteck, Aaron Edelman, MD  rivaroxaban (XARELTO) 20 MG TABS tablet Take 20 mg by mouth at bedtime.    [provider]  senna (SENOKOT) 8.6 MG TABS tablet Take 2 tablets (17.2 mg total) by mouth  at bedtime. 06/05/17   Rod Can, MD    Inpatient Medications: Scheduled Meds:  Continuous Infusions:  PRN Meds:   Allergies:    Allergies  Allergen Reactions   Latex Rash and Other (See Comments)    Reaction to knee wraps    Social History:   Social History   Socioeconomic History   Marital status: Married    Spouse name: Not on file   Number of children: 3   Years of education: Not on file   Highest education level: Not on file  Occupational History   Occupation: retired  Tobacco Use   Smoking status: Never   Smokeless tobacco: Former    Quit date: 07/23/1968  Vaping Use   Vaping Use: Never used  Substance and Sexual Activity   Alcohol use: Yes    Alcohol/week: 1.0 standard drink of alcohol    Types: 1 Shots of liquor per week    Comment: occasional   Drug use: No   Sexual activity: Yes  Other Topics Concern   Not on file  Social History Narrative   Not on file   Social Determinants of Health   Financial Resource Strain: Not on file  Food Insecurity: Not on file  Transportation Needs: Not on file  Physical Activity: Not on file  Stress: Not on file  Social Connections: Not on file  Intimate Partner Violence: Not on file    Family History:    Family History   Problem Relation Age of Onset   Suicidality Unknown    Cancer Unknown    Alcohol abuse Unknown    Cancer Mother    Suicidality Father      ROS:  Unable to obtain as patient has dementia  Physical Exam/Data:   Vitals:   11/24/21 1400 11/24/21 1415 11/24/21 1430 11/24/21 1445  BP: 138/73 (!) 152/65 (!) 150/81 (!) 142/77  Pulse: (!) 57 62 80 (!) 54  Resp: '14 17 16 16  '$ Temp:      TempSrc:      SpO2: 99% 95% 97% 98%  Weight:      Height:       No intake or output data in the 24 hours ending 11/24/21 1601    11/24/2021   12:23 PM 09/20/2021    4:26 AM 02/14/2021    1:40 PM  Last 3 Weights  Weight (lbs) 215 lb 215 lb 209 lb  Weight (kg) 97.523 kg 97.523 kg 94.802 kg     Body mass index is 31.75 kg/m.   Vitals:  Vitals:   11/24/21 1430 11/24/21 1445  BP: (!) 150/81 (!) 142/77  Pulse: 80 (!) 54  Resp: 16 16  Temp:    SpO2: 97% 98%   General Appearance: In no apparent distress, laying in bed HEENT: Normocephalic, atraumatic.  Neck: Supple, trachea midline, no JVD Cardiovascular: Irregularly irregular, normal S1-S2,  no murmur; carotid massage maneuver was used at bedside, noted sinus pause lasting 3.29 seconds with further slowing of ventricular rate to 40s from 60s without complaints of lightheadedness Respiratory: Resting breathing unlabored, lungs sounds clear to auscultation bilaterally, no use of accessory muscles. On room air.  No wheezes, rales or rhonchi.   Gastrointestinal: Bowel sounds positive, abdomen soft , non-tender Extremities: Able to move all extremities in bed without difficulty, no edema of BLE Musculoskeletal: Normal muscle bulk and tone Skin: Intact, warm, dry. No rashes or petechiae noted in exposed areas.  Neurologic: Oriented to self, confused, unable offer meaningful history  Psychiatric:calm  EKG:  The EKG was personally reviewed and demonstrates: A-fib with ventricular rate of 67 bpm, no acute ischemic change comparing to EKG from  08/2021  Telemetry:  Telemetry was personally reviewed and demonstrates:  A fib with VR 40s to 60s , pauses <2 second noted   Relevant CV Studies:  Echocardiogram from 03/18/13:  - Left ventricle: The cavity size was normal. Systolic    function was normal. The estimated ejection fraction was    in the range of 50% to 55%. Wall motion was normal; there    were no regional wall motion abnormalities.  - Left atrium: The atrium was mildly dilated.  - Right atrium: The atrium was mildly dilated.    Laboratory Data:  High Sensitivity Troponin:   Recent Labs  Lab 11/24/21 1305  TROPONINIHS 8     Chemistry Recent Labs  Lab 11/24/21 1305  NA 141  K 3.8  CL 108  CO2 30  GLUCOSE 143*  BUN 18  CREATININE 1.35*  CALCIUM 9.7  MG 1.9  GFRNONAA 54*  ANIONGAP 3*    Recent Labs  Lab 11/24/21 1305  PROT 5.8*  ALBUMIN 3.1*  AST 21  ALT 6  ALKPHOS 67  BILITOT 0.7   Lipids No results for input(s): "CHOL", "TRIG", "HDL", "LABVLDL", "LDLCALC", "CHOLHDL" in the last 168 hours.  Hematology Recent Labs  Lab 11/24/21 1305  WBC 4.7  RBC 3.46*  HGB 11.2*  HCT 34.7*  MCV 100.3*  MCH 32.4  MCHC 32.3  RDW 13.3  PLT 116*   Thyroid No results for input(s): "TSH", "FREET4" in the last 168 hours.  BNPNo results for input(s): "BNP", "PROBNP" in the last 168 hours.  DDimer  Recent Labs  Lab 11/24/21 1305  DDIMER 0.34     Radiology/Studies:  CT Head Wo Contrast  Result Date: 11/24/2021 CLINICAL DATA:  Syncope EXAM: CT HEAD WITHOUT CONTRAST TECHNIQUE: Contiguous axial images were obtained from the base of the skull through the vertex without intravenous contrast. RADIATION DOSE REDUCTION: This exam was performed according to the departmental dose-optimization program which includes automated exposure control, adjustment of the mA and/or kV according to patient size and/or use of iterative reconstruction technique. COMPARISON:  CT brain 09/20/2021 FINDINGS: Brain: No acute  territorial infarction, hemorrhage or intracranial mass. Encephalomalacia within the right frontal and parietal lobes. Atrophy and chronic small vessel ischemic changes of the white matter. Stable ventricle size. Vascular: No hyperdense vessels.  Carotid vascular calcification. Skull: Normal. Negative for fracture or focal lesion. Sinuses/Orbits: Mild mucosal thickening in the maxillary sinuses. Other: None IMPRESSION: 1. No CT evidence for acute intracranial abnormality. 2. Atrophy and chronic small vessel ischemic changes of the white matter. Chronic right frontal and parietal infarcts. Electronically Signed   By: Donavan Foil M.D.   On: 11/24/2021 15:28   DG Chest Portable 1 View  Result Date: 11/24/2021 CLINICAL DATA:  Syncope and bradycardia EXAM: PORTABLE CHEST 1 VIEW COMPARISON:  09/20/2021 chest radiograph. FINDINGS: Stable cardiomediastinal silhouette with mild cardiomegaly. No pneumothorax. No pleural effusion. Lungs appear clear, with no acute consolidative airspace disease and no pulmonary edema. IMPRESSION: Stable mild cardiomegaly without pulmonary edema. No active pulmonary disease. Electronically Signed   By: Ilona Sorrel M.D.   On: 11/24/2021 13:09     Assessment and Plan:   Syncope Carotid sinus hypersensitivity Chronic atrial fibrillation with slow ventricular rate - history concerning for arrhythmia associated syncope - orthostatic VS negative Lying 146/80, AP 52, standing 159/104 AP 65 - Bradycardia  with sinus pause>3s noted after carotid massage - Unclear if benazepril and Namenda are contributing - will plan for pacemaker implant tomorrow - Anticoagulation with Xarelto can be held  HTN - BP slightly elevated, may continue lisinopril 10 mg daily  Hyperlipidemia -May continue atorvastatin and Repatha  Dementia Bipolar disorder Tremor Chronic pain Hx of CVA -Would defer management to internal medicine service      Risk Assessment/Risk Scores:   CHA2DS2-VASc  Score = 6  This indicates a 9.7% annual risk of stroke. The patient's score is based upon: CHF History: 0 HTN History: 1 Diabetes History: 0 Stroke History: 2 Vascular Disease History: 1 Age Score: 2 Gender Score: 0     For questions or updates, please contact [place] Please consult [email or web address] for contact info under    Signed, Margie Billet, NP  11/24/2021 4:01 PM  Syncope  Atrial fibrillation-permanent  Bradycardia documented  Carotid sinus hypersensitivity  Hypertension  Complex neurocognitive disease-bipolar/dementia/CVA/  Presented following syncopal episode at church.  His wife describes it occurring while he was seated.  No loss of color, loss of postural tone with his head falling backwards and breathing becoming obstructed.  EMS/physicians were in church and laid him flat with gradual recovery.  EMS arrived and during transport had a heart rate dropped to 20.  Some heart rates in the 40s in the ER.  Carotid sinus hypersensitivity with a 3.3-second pause.  The description of the event is not so suggestive of a vasomotor event not withstanding occurring in the chart.  He was not seated, did not lose color, and then on the arrival of EMS had a heart rate dropped to 20 with normal blood pressure.  This in addition to the carotid massage pause inclined me towards recommending pacing for the possibility that his syncope was related to bradycardia.  His wife is somewhat concerned about this and I suggested an alternative strategy would be to monitor him and discharge him without intervention the concern being that there might be a higher likelihood of a recurrent event   although with an isolated event, the risk of recurrences are only in the range of 15-30%.  They know Dr. Alvester Chou well, I have texted him and asked him to come by and speak with the family tomorrow.  He is in the hospital.  I do not think there is a medically right answer 1 way or the other.  We will  hold his Xarelto overnight, although we do with some trepidation given his history of stroke.  There is no role for bridging. The question has been raised as to whether he could be compliant with left arm immobilization following device implantation.  I suspect a sling/shoulder immobilizer would suffice  Reviewing the literature as best as I can Namenda and Aricept while both associated with bradycardia have not been reported to be associated with heart block which is the mechanism of bradycardia in the context of atrial fibrillation.  Hence, would recommend that we continue if his PCP thinks that they are value

## 2021-11-24 NOTE — H&P (Addendum)
History and Physical    [name] [name] KTG:[id number] DOB: Jun 25, 1945 DOA: 11/24/2021  PCP: Deland Pretty, MD (Confirm with patient/family/NH records and if not entered, this has to be entered at [facility] point of entry) Patient coming from: Home  I have personally briefly reviewed patient's old medical records in [place]  Chief Complaint: Feeling OK  HPI: [name] [name] is a 76 y.o. male with medical history significant of PAF on Xarelto, chronic vascular dementia, bipolar disorder, chronic back pain on narcotics, HTN, HLD, presented with syncope and bradycardia.  Patient was at church done suddenly collapsed and fall, was witnessed by bystanders.  And wife at the side found the patient looks cold and clammy.  Patient was found to be bradycardic heart rate <30s when EMS arrived.  Patient recovered consciousness after EMS arrived but cannot recall any prodromes.  Patient did lose control of of urine during the episode.  According to wife, patient has chronic PAF, not on any rate control medications.  ED Course: Telemetry monitoring showed A-fib, heart rate ranging from 40 to 60s, with frequent sinus pauses as soon as 3.5 seconds.  Blood pressure maintains no hypoxia afebrile.  Chest x-ray clear, CT head negative for acute findings.  Review of Systems: Unable to perform, baseline demented. Past Medical History:  Diagnosis Date   Alzheimer disease ([place])    Anxiety    Arthritis    Atrial fibrillation (HCC)    Chronic   Bipolar disorder (HCC)    Chronic low back pain    Dementia (HCC)    Depression    Dizziness    Dysrhythmia    a fib   ED (erectile dysfunction)    External hemorrhoids    Fatigue    HLD (hyperlipidemia)    HTN (hypertension)    Hypogonadism in male    Lipoma of skin    Memory loss    Mixed Alzheimer's and vascular dementia (HCC)    Poor balance    Restless legs syndrome (RLS)    Sleep apnea    cpap   Stroke ([place]) 1991   Tremor     Past Surgical  History:  Procedure Laterality Date   COLONOSCOPY     KNEE ARTHROPLASTY Left 06/04/2017   Procedure: LEFT TOTAL KNEE ARTHROPLASTY WITH COMPUTER NAVIGATION;  Surgeon: Rod Can, MD;  Location: [facility];  Service: Orthopedics;  Laterality: Left;  NEEDS RNFA   TONSILLECTOMY AND ADENOIDECTOMY     TOTAL HIP ARTHROPLASTY Right 07/31/2016   Procedure: RIGHT TOTAL HIP ARTHROPLASTY ANTERIOR APPROACH;  Surgeon: Rod Can, MD;  Location: [facility];  Service: Orthopedics;  Laterality: Right;  Requesting RNFA     reports that he has never smoked. He quit smokeless tobacco use about 53 years ago. He reports current alcohol use of about 1.0 standard drink of alcohol per week. He reports that he does not use drugs.  Allergies  Allergen Reactions   Latex Rash and Other (See Comments)    Reaction to knee wraps    Family History  Problem Relation Age of Onset   Suicidality Unknown    Cancer Unknown    Alcohol abuse Unknown    Cancer Mother    Suicidality Father      Prior to Admission medications   Medication Sig Start Date End Date Taking? Authorizing Provider  atorvastatin (LIPITOR) 40 MG tablet Take 40 mg by mouth daily.  11/27/15   [provider]  b complex vitamins tablet Take 1 tablet by  mouth daily.    [provider]  carbidopa-levodopa (SINEMET IR) 25-250 MG per tablet Take 1 tablet by mouth 2 (two) times daily.     [provider]  divalproex (DEPAKOTE) 250 MG DR tablet Take 1 tablet by mouth daily. 10/28/19   [provider]  docusate sodium (COLACE) 100 MG capsule Take 1 capsule (100 mg total) by mouth 2 (two) times daily. 06/05/17   Swinteck, Aaron Edelman, MD  donepezil (ARICEPT) 10 MG tablet Take 1 tablet (10 mg total) by mouth at bedtime. 08/08/14   Tomi Likens, Adam R, DO  Evolocumab (REPATHA [place]) Inject into the skin every 14 (fourteen) days.    [provider]  fluocinonide gel (LIDEX) 1.94 % Apply 1 application topically 2 (two) times daily.     [provider]  folic acid (FOLVITE) 1 MG tablet Take 1 mg by mouth at bedtime.     [provider]  gabapentin (NEURONTIN) 100 MG capsule Take 200 mg by mouth 2 (two) times daily. 02/02/16   [provider]  gabapentin (NEURONTIN) 300 MG capsule Take 1 capsule by mouth 2 (two) times daily. 10/28/19   [provider]  HYDROcodone-acetaminophen (NORCO/VICODIN) 5-325 MG tablet Take 1-2 tablets by mouth every 4 (four) hours as needed. 06/05/17   Swinteck, Aaron Edelman, MD  lamoTRIgine (LAMICTAL) 150 MG tablet Take 150 mg by mouth 2 (two) times daily.    [provider]  lisinopril (PRINIVIL,ZESTRIL) 10 MG tablet Take 10 mg by mouth daily.     [provider]  memantine (NAMENDA) 10 MG tablet Take 10 mg by mouth 2 (two) times daily.     [provider]  metroNIDAZOLE (METROGEL) 0.75 % gel Apply 1 application topically 2 (two) times daily.    [provider]  ondansetron (ZOFRAN) 4 MG tablet Take 1 tablet (4 mg total) by mouth every 6 (six) hours as needed for nausea. 06/05/17   Swinteck, Aaron Edelman, MD  rivaroxaban (XARELTO) 20 MG TABS tablet Take 20 mg by mouth at bedtime.    [provider]  senna (SENOKOT) 8.6 MG TABS tablet Take 2 tablets (17.2 mg total) by mouth at bedtime. 06/05/17   Rod Can, MD    Physical Exam: Vitals:   11/24/21 1400 11/24/21 1415 11/24/21 1430 11/24/21 1445  BP: 138/73 (!) 152/65 (!) 150/81 (!) 142/77  Pulse: (!) 57 62 80 (!) 54  Resp: '14 17 16 16  '$ Temp:      TempSrc:      SpO2: 99% 95% 97% 98%  Weight:      Height:        Constitutional: NAD, calm, comfortable Vitals:   11/24/21 1400 11/24/21 1415 11/24/21 1430 11/24/21 1445  BP: 138/73 (!) 152/65 (!) 150/81 (!) 142/77  Pulse: (!) 57 62 80 (!) 54  Resp: '14 17 16 16  '$ Temp:      TempSrc:      SpO2: 99% 95% 97% 98%  Weight:      Height:       Eyes: PERRL, lids and conjunctivae normal ENMT: Mucous membranes are moist. Posterior pharynx  clear of any exudate or lesions.Normal dentition.  Neck: normal, supple, no masses, no thyromegaly Respiratory: clear to auscultation bilaterally, no wheezing, no crackles. Normal respiratory effort. No accessory muscle use.  Cardiovascular: Irregular heartbeat, bradycardia, no murmurs / rubs / gallops. No extremity edema. 2+ pedal pulses. No carotid bruits.  Abdomen: no tenderness, no masses palpated. No hepatosplenomegaly. Bowel sounds positive.  Musculoskeletal: no clubbing /  cyanosis. No joint deformity upper and lower extremities. Good ROM, no contractures. Normal muscle tone.  Skin: no rashes, lesions, ulcers. No induration Neurologic: CN 2-12 grossly intact. Sensation intact, DTR normal. Strength 5/5 in all 4.  Psychiatric: Awake, oriented to himself, confused about time and place   Labs on Admission: I have personally reviewed following labs and imaging studies  CBC: Recent Labs  Lab 11/24/21 1305  WBC 4.7  NEUTROABS 3.2  HGB 11.2*  HCT 34.7*  MCV 100.3*  PLT 295*   Basic Metabolic Panel: Recent Labs  Lab 11/24/21 1305  NA 141  K 3.8  CL 108  CO2 30  GLUCOSE 143*  BUN 18  CREATININE 1.35*  CALCIUM 9.7  MG 1.9   GFR: Estimated Creatinine Clearance: 51.9 mL/min (A) (by C-G formula based on SCr of 1.35 mg/dL (H)). Liver Function Tests: Recent Labs  Lab 11/24/21 1305  AST 21  ALT 6  ALKPHOS 67  BILITOT 0.7  PROT 5.8*  ALBUMIN 3.1*   No results for input(s): "LIPASE", "AMYLASE" in the last 168 hours. No results for input(s): "AMMONIA" in the last 168 hours. Coagulation Profile: No results for input(s): "INR", "PROTIME" in the last 168 hours. Cardiac Enzymes: No results for input(s): "CKTOTAL", "CKMB", "CKMBINDEX", "TROPONINI" in the last 168 hours. BNP (last 3 results) No results for input(s): "PROBNP" in the last 8760 hours. HbA1C: No results for input(s): "HGBA1C" in the last 72 hours. CBG: No results for input(s): "GLUCAP" in the last 168  hours. Lipid Profile: No results for input(s): "CHOL", "HDL", "LDLCALC", "TRIG", "CHOLHDL", "LDLDIRECT" in the last 72 hours. Thyroid Function Tests: Recent Labs    11/24/21 1524  TSH 1.759  FREET4 0.88   Anemia Panel: No results for input(s): "VITAMINB12", "FOLATE", "FERRITIN", "TIBC", "IRON", "RETICCTPCT" in the last 72 hours. Urine analysis:    Component Value Date/Time   COLORURINE YELLOW 03/17/2013 2048   APPEARANCEUR CLEAR 03/17/2013 2048   LABSPEC 1.012 03/17/2013 2048   PHURINE 8.0 03/17/2013 2048   GLUCOSEU NEGATIVE 03/17/2013 2048   HGBUR NEGATIVE 03/17/2013 2048   BILIRUBINUR NEGATIVE 03/17/2013 2048   KETONESUR 15 (A) 03/17/2013 2048   PROTEINUR NEGATIVE 03/17/2013 2048   UROBILINOGEN 0.2 03/17/2013 2048   NITRITE NEGATIVE 03/17/2013 2048   LEUKOCYTESUR NEGATIVE 03/17/2013 2048    Radiological Exams on Admission: CT Head Wo Contrast  Result Date: 11/24/2021 CLINICAL DATA:  Syncope EXAM: CT HEAD WITHOUT CONTRAST TECHNIQUE: Contiguous axial images were obtained from the base of the skull through the vertex without intravenous contrast. RADIATION DOSE REDUCTION: This exam was performed according to the departmental dose-optimization program which includes automated exposure control, adjustment of the mA and/or kV according to patient size and/or use of iterative reconstruction technique. COMPARISON:  CT brain 09/20/2021 FINDINGS: Brain: No acute territorial infarction, hemorrhage or intracranial mass. Encephalomalacia within the right frontal and parietal lobes. Atrophy and chronic small vessel ischemic changes of the white matter. Stable ventricle size. Vascular: No hyperdense vessels.  Carotid vascular calcification. Skull: Normal. Negative for fracture or focal lesion. Sinuses/Orbits: Mild mucosal thickening in the maxillary sinuses. Other: None IMPRESSION: 1. No CT evidence for acute intracranial abnormality. 2. Atrophy and chronic small vessel ischemic changes of the  white matter. Chronic right frontal and parietal infarcts. Electronically Signed   By: Donavan Foil M.D.   On: 11/24/2021 15:28   DG Chest Portable 1 View  Result Date: 11/24/2021 CLINICAL DATA:  Syncope and bradycardia EXAM: PORTABLE CHEST 1 VIEW COMPARISON:  09/20/2021 chest  radiograph. FINDINGS: Stable cardiomediastinal silhouette with mild cardiomegaly. No pneumothorax. No pleural effusion. Lungs appear clear, with no acute consolidative airspace disease and no pulmonary edema. IMPRESSION: Stable mild cardiomegaly without pulmonary edema. No active pulmonary disease. Electronically Signed   By: Ilona Sorrel M.D.   On: 11/24/2021 13:09    EKG: Independently reviewed.  A-fib, with occasional sinus pauses up to 3.5 seconds  Assessment/Plan Principal Problem:   Bradycardia Active Problems:   Syncope, cardiogenic  (please populate well all problems here in Problem List. (For example, if patient is on BP meds at home and you resume or decide to hold them, it is a problem that needs to be her. Same for CAD, COPD, HLD and so on)  Syncope -Cardiogenic, likely from bradycardia/sick sinus syndrome. -Admit to PCU for close monitoring, atropine for HR<35.  Cardiology consultation appreciated, hold off Xarelto, last dose of Xarelto this morning 10 AM, discussed with pharmacy, pharmacal dynamically, no need to redose any anticoagulation until 10 AM tomorrow, will hold off anticoagulation or chemical DVT prophylaxis tonight, likely PPM evaluation tomorrow AM. -TSH pending -Make K> 4.0, magnesium 1.9, check phosphorus level.  HTN -Hold off home BP meds, start as needed hydralazine  Bipolar disorder -Mentation at baseline, continue Depakote and Lamictal  Chronic back pain -Continue gabapentin and Norco  Advanced vascular dementia with Parkinson features -Continue Aricept memantine and Sinemet  DVT prophylaxis: Heparin drip Code Status: Full code Family Communication: Wife at  bedside Disposition Plan: Patient sick with new onset of symptomatic bradycardia and syncope, requiring inpatient PPM evaluation, expect more than 2 midnight hospital stay Consults called: Cardiology Admission status: PCU   Lequita Halt MD [facility] Pager [phone]  11/24/2021, 4:59 PM

## 2021-11-24 NOTE — Progress Notes (Signed)
RT came to speak with pt about CPAP. Upon arrival pt trying to get out of bed to leave the hospital and is confused. CPAP held for now. RT will continue to monitor.

## 2021-11-24 NOTE — ED Provider Notes (Signed)
[place] EMERGENCY DEPARTMENT Provider Note   CSN: [id number] Arrival date & time: 11/24/21  1213     History  No chief complaint on file.   RAINIER FEUERBORN is a 76 y.o. male with HLD, chronic Afib, HTN, h/o TIA, bipolar disorder, mixed alzheimer's and vascular dementia, B12 deficiency, OSA on CPAP, parkinsonian features w/ essential tremor presents with syncope and bradycardia.  Patient cannot provide history d/t dementia history and is not sure why he is here. Per EMS, patient experienced a syncopal episode while at church. Churchgoers noted his pulse to be very low, and EMS found pulse in 40s bpm.  Positive urinary incontinence; no tonic-clonic activity reported and patient was not postictal when he woke.  He was given '1mg'$  atropine with EMS. Lowest HR 28 bpm. On arrival, patient has no complaints, no CP, shortness of breath, lightheadedness, palpitations, nausea vomiting.  Unclear if he struck his head, but patient denies head/neck/facial pain.  Heart rate on arrival in the 70s slow A-fib on the monitor.     HPI     Home Medications Prior to Admission medications   Medication Sig Start Date End Date Taking? Authorizing Provider  atorvastatin (LIPITOR) 40 MG tablet Take 40 mg by mouth daily.  11/27/15   [provider]  b complex vitamins tablet Take 1 tablet by mouth daily.    [provider]  carbidopa-levodopa (SINEMET IR) 25-250 MG per tablet Take 1 tablet by mouth 2 (two) times daily.     [provider]  divalproex (DEPAKOTE) 250 MG DR tablet Take 1 tablet by mouth daily. 10/28/19   [provider]  docusate sodium (COLACE) 100 MG capsule Take 1 capsule (100 mg total) by mouth 2 (two) times daily. 06/05/17   Swinteck, Aaron Edelman, MD  donepezil (ARICEPT) 10 MG tablet Take 1 tablet (10 mg total) by mouth at bedtime. 08/08/14   Tomi Likens, Adam R, DO  Evolocumab (REPATHA [place]) Inject into the skin every 14 (fourteen) days.    [provider]  fluocinonide gel (LIDEX) 8.33 % Apply 1 application topically 2 (two) times daily.    [provider]  folic acid (FOLVITE) 1 MG tablet Take 1 mg by mouth at bedtime.     [provider]  gabapentin (NEURONTIN) 100 MG capsule Take 200 mg by mouth 2 (two) times daily. 02/02/16   [provider]  gabapentin (NEURONTIN) 300 MG capsule Take 1 capsule by mouth 2 (two) times daily. 10/28/19   [provider]  HYDROcodone-acetaminophen (NORCO/VICODIN) 5-325 MG tablet Take 1-2 tablets by mouth every 4 (four) hours as needed. 06/05/17   Swinteck, Aaron Edelman, MD  lamoTRIgine (LAMICTAL) 150 MG tablet Take 150 mg by mouth 2 (two) times daily.    [provider]  lisinopril (PRINIVIL,ZESTRIL) 10 MG tablet Take 10 mg by mouth daily.     [provider]  memantine (NAMENDA) 10 MG tablet Take 10 mg by mouth 2 (two) times daily.     [provider]  metroNIDAZOLE (METROGEL) 0.75 % gel Apply 1 application topically 2 (two) times daily.    [provider]  ondansetron (ZOFRAN) 4 MG tablet Take 1 tablet (4 mg total) by mouth every 6 (six) hours as needed for nausea. 06/05/17   Swinteck, Aaron Edelman, MD  rivaroxaban (XARELTO) 20 MG TABS tablet Take 20 mg by mouth at bedtime.    [provider]  senna (SENOKOT) 8.6 MG TABS tablet Take 2 tablets (17.2 mg total) by  mouth at bedtime. 06/05/17   Swinteck, Aaron Edelman, MD      Allergies    Latex    Review of Systems   Review of Systems Review of systems positive for LOC.  A 10 point review of systems was performed and is negative unless otherwise reported in HPI.  Physical Exam Updated Vital Signs BP (!) 142/77   Pulse (!) 54   Temp 97.6 F (36.4 C) (Oral)   Resp 16   Ht '5\' 9"'$  (1.753 m)   Wt 97.5 kg   SpO2 98%   BMI 31.75 kg/m  Physical Exam General: Elderly-appearing male, lying in bed.  HEENT: PERRLA 64m, Sclera anicteric, MMM, trachea midline. Stable forehead, midface, nasal bridge.  No midline C-spine tenderness. NCAT, no skull depressions/deformities noted. Cardiology: Irregular rhythm, normal rate, no murmurs/rubs/gallops. BL radial and DP pulses equal bilaterally.  Resp: Normal respiratory rate and effort. CTAB, no wheezes, rhonchi, crackles.  Abd: Soft, non-tender, non-distended. No rebound tenderness or guarding.  GU: Deferred. MSK: No peripheral edema or signs of trauma. Extremities without deformity or TTP. No cyanosis or clubbing. Skin: warm, dry. No rashes or lesions. Neuro: A&Ox3 (not to situation), CNs II-XII grossly intact. 5/5 strength in all four extremities. Sensation grossly intact. No tremor noted. Psych: Normal mood and affect, very pleasant.  ED Results / Procedures / Treatments   Labs (all labs ordered are listed, but only abnormal results are displayed) Labs Reviewed  CBC WITH DIFFERENTIAL/PLATELET - Abnormal; Notable for the following components:      Result Value   RBC 3.46 (*)    Hemoglobin 11.2 (*)    HCT 34.7 (*)    MCV 100.3 (*)    Platelets 116 (*)    All other components within normal limits  COMPREHENSIVE METABOLIC PANEL - Abnormal; Notable for the following components:   Glucose, Bld 143 (*)    Creatinine, Ser 1.35 (*)    Total Protein 5.8 (*)    Albumin 3.1 (*)    GFR, Estimated 54 (*)    Anion gap 3 (*)    All other components within normal limits  MAGNESIUM  D-DIMER, QUANTITATIVE  T4, FREE  TSH  TROPONIN I (HIGH SENSITIVITY)  TROPONIN I (HIGH SENSITIVITY)    EKG EKG Interpretation  Date/Time:  Sunday November 24 2021 12:21:55 EDT Ventricular Rate:  67 PR Interval:    QRS Duration: 107 QT Interval:  424 QTC Calculation: 448 R Axis:   63 Text Interpretation: Atrial fibrillation RSR' in V1 or V2, probably normal variant Minimal ST depression, inferior leads Confirmed by NCindee Lame([phone] on 11/24/2021 12:34:42 PM  Radiology CT Head Wo Contrast  Result Date: 11/24/2021 CLINICAL DATA:  Syncope EXAM: CT HEAD  WITHOUT CONTRAST TECHNIQUE: Contiguous axial images were obtained from the base of the skull through the vertex without intravenous contrast. RADIATION DOSE REDUCTION: This exam was performed according to the departmental dose-optimization program which includes automated exposure control, adjustment of the mA and/or kV according to patient size and/or use of iterative reconstruction technique. COMPARISON:  CT brain 09/20/2021 FINDINGS: Brain: No acute territorial infarction, hemorrhage or intracranial mass. Encephalomalacia within the right frontal and parietal lobes. Atrophy and chronic small vessel ischemic changes of the white matter. Stable ventricle size. Vascular: No hyperdense vessels.  Carotid vascular calcification. Skull: Normal. Negative for fracture or focal lesion. Sinuses/Orbits: Mild mucosal thickening in the maxillary sinuses. Other: None IMPRESSION: 1. No CT evidence for acute intracranial abnormality. 2. Atrophy and chronic small vessel ischemic changes  of the white matter. Chronic right frontal and parietal infarcts. Electronically Signed   By: Donavan Foil M.D.   On: 11/24/2021 15:28   DG Chest Portable 1 View  Result Date: 11/24/2021 CLINICAL DATA:  Syncope and bradycardia EXAM: PORTABLE CHEST 1 VIEW COMPARISON:  09/20/2021 chest radiograph. FINDINGS: Stable cardiomediastinal silhouette with mild cardiomegaly. No pneumothorax. No pleural effusion. Lungs appear clear, with no acute consolidative airspace disease and no pulmonary edema. IMPRESSION: Stable mild cardiomegaly without pulmonary edema. No active pulmonary disease. Electronically Signed   By: Ilona Sorrel M.D.   On: 11/24/2021 13:09    Procedures Procedures    Medications Ordered in ED Medications - No data to display  ED Course/ Medical Decision Making/ A&P                          Medical Decision Making Amount and/or Complexity of Data Reviewed Labs: ordered. Decision-making details documented in ED  Course. Radiology: ordered. Decision-making details documented in ED Course.  Risk Decision regarding hospitalization.   Patient normotensive, no lightheadedness/CP, w/ rates in 70s initially upon arrival to ED s/p 1 mg IV atropine. Patient has no complaints.   Consider patient's bradycardia as possible cause of his syncope. EKG notes initially rate controlled afib w/ no signs of ischemia, but as patient stays in ED for longer, patient's heart rate drops into 50s and 40s bpm, slow afib. Per documentation from office visits, patient's heart rate typically in 70s-80s. Will monitor patient closely for worsening bradycardia and symptoms, will give atropine or transcutaneously pace if necessary. Consider electrolyte abnormalities, ACS, anemia.  Consider seizure, as there was report of urinary incontinence, however patient has no history of seizures and there was no clonic activity reported, patient was not confused when he awoke.  Patient has no chest pain to indicate aortic dissection and no shortness of breath/chest pain/hypoxia/leg swelling to indicate DVT/PE.  Unclear if patient fell or hit his head and will obtain CT head to rule out ICH.  No focal neurodeficits or aphasia to indicate CVA.   D-dimer negative, Hgb 11.2, CXR w/o acute findings, no leukocytosis, lytes okay, Cr at baseline. I have personally reviewed and interpreted all labs and imaging.   Clinical Course as of 11/24/21 1648  Sun Nov 24, 2021  1325 D-Dimer, Quant: 0.34 [HN]  1325 Hemoglobin(!): 11.2 [HN]  1325 WBC: 4.7 [HN]  1325 DG Chest Portable 1 View Stable mild cardiomegaly without pulmonary edema. No active pulmonary disease. [HN]  1426 Pulse Rate(!): 55 Trending downward. D/w patient's wife at bedside who states this has never happened before. She does not think there is a chance that patient took too much medication today. Patient still has no CP, SOB, palpitations, or lightheadedness.  [HN]  1428 No electrolyte  abnormalities. Neg initial troponin. [HN]  1429 Creatinine(!): 1.35 C/w prior value in July [HN]  1508 Heart rates trending downward. Added TSH/Ft4 and consulted to cardiology.  [HN]  1520 Cardiology coming to evaluate patient. [HN]  1631 Cardiology recommending medicine admission for possible pacemaker placement tomorrow and EP evaluation. [HN]  1641 Pt admitted to hospitalist. [HN]    Clinical Course User Index [HN] Audley Hose, MD          Final Clinical Impression(s) / ED Diagnoses Final diagnoses:  Syncope, unspecified syncope type  Bradycardia    Rx / DC Orders ED Discharge Orders     None        This  note was created using dictation software, which may contain spelling or grammatical errors.    Audley Hose, MD 11/24/21 [phone]

## 2021-11-25 ENCOUNTER — Encounter (HOSPITAL_COMMUNITY)
Admission: EM | Disposition: A | Discharge status: Home / Self Care | Payer: Self-pay | Source: Home / Self Care | Attending: Family Medicine

## 2021-11-25 DIAGNOSIS — R001 Bradycardia, unspecified: Secondary | ICD-10-CM | POA: Diagnosis not present

## 2021-11-25 DIAGNOSIS — R55 Syncope and collapse: Secondary | ICD-10-CM | POA: Diagnosis not present

## 2021-11-25 HISTORY — PX: LOOP RECORDER INSERTION: EP1214

## 2021-11-25 LAB — CBC
HCT: 35.4 % — ABNORMAL LOW (ref 39.0–52.0)
Hemoglobin: 11.2 g/dL — ABNORMAL LOW (ref 13.0–17.0)
MCH: 31.5 pg (ref 26.0–34.0)
MCHC: 31.6 g/dL (ref 30.0–36.0)
MCV: 99.4 fL (ref 80.0–100.0)
Platelets: 126 10*3/uL — ABNORMAL LOW (ref 150–400)
RBC: 3.56 MIL/uL — ABNORMAL LOW (ref 4.22–5.81)
RDW: 13.2 % (ref 11.5–15.5)
WBC: 5.8 10*3/uL (ref 4.0–10.5)
nRBC: 0 % (ref 0.0–0.2)

## 2021-11-25 LAB — BASIC METABOLIC PANEL
Anion gap: 4 — ABNORMAL LOW (ref 5–15)
BUN: 18 mg/dL (ref 8–23)
CO2: 27 mmol/L (ref 22–32)
Calcium: 9.8 mg/dL (ref 8.9–10.3)
Chloride: 109 mmol/L (ref 98–111)
Creatinine, Ser: 1.31 mg/dL — ABNORMAL HIGH (ref 0.61–1.24)
GFR, Estimated: 56 mL/min — ABNORMAL LOW (ref >60)
Glucose, Bld: 105 mg/dL — ABNORMAL HIGH (ref 70–99)
Potassium: 4.1 mmol/L (ref 3.5–5.1)
Sodium: 140 mmol/L (ref 135–145)

## 2021-11-25 LAB — SURGICAL PCR SCREEN
MRSA, PCR: NEGATIVE
Staphylococcus aureus: NEGATIVE

## 2021-11-25 LAB — GLUCOSE, CAPILLARY: Glucose-Capillary: 105 mg/dL — ABNORMAL HIGH (ref 70–99)

## 2021-11-25 SURGERY — LOOP RECORDER INSERTION

## 2021-11-25 MED ORDER — RIVAROXABAN 20 MG PO TABS
20.0000 mg | ORAL_TABLET | Freq: Every day | ORAL | Status: DC
  Administered 2021-11-25: 20 mg via ORAL
  Filled 2021-11-25: qty 1

## 2021-11-25 MED ORDER — LIDOCAINE-EPINEPHRINE 1 %-1:100000 IJ SOLN
INTRAMUSCULAR | Status: DC | PRN
  Administered 2021-11-25: 10 mL

## 2021-11-25 MED ORDER — SODIUM CHLORIDE 0.9% FLUSH: 3.0000 mL | Freq: Two times a day (BID) | INTRAVENOUS | Status: DC

## 2021-11-25 MED ORDER — SODIUM CHLORIDE 0.9% FLUSH: 3.0000 mL | INTRAVENOUS | Status: DC | PRN

## 2021-11-25 MED ORDER — SODIUM CHLORIDE 0.9 % IV SOLN
80.0000 mg | INTRAVENOUS | Status: DC
  Filled 2021-11-25: qty 2

## 2021-11-25 MED ORDER — CHLORHEXIDINE GLUCONATE 4 % EX LIQD
60.0000 mL | Freq: Once | CUTANEOUS | Status: DC
  Filled 2021-11-25: qty 60

## 2021-11-25 MED ORDER — SODIUM CHLORIDE 0.9 % IV SOLN: INTRAVENOUS | Status: DC

## 2021-11-25 MED ORDER — LIDOCAINE-EPINEPHRINE 1 %-1:100000 IJ SOLN
INTRAMUSCULAR | Status: AC
  Filled 2021-11-25: qty 1

## 2021-11-25 MED ORDER — CEFAZOLIN SODIUM-DEXTROSE 2-4 GM/100ML-% IV SOLN
2.0000 g | INTRAVENOUS | Status: DC
  Filled 2021-11-25: qty 100

## 2021-11-25 MED ORDER — SODIUM CHLORIDE 0.9 % IV SOLN: 250.0000 mL | INTRAVENOUS | Status: DC

## 2021-11-25 MED ORDER — HALOPERIDOL LACTATE 5 MG/ML IJ SOLN
2.0000 mg | Freq: Once | INTRAMUSCULAR | Status: AC | PRN
  Administered 2021-11-25: 2 mg via INTRAVENOUS
  Filled 2021-11-25: qty 1

## 2021-11-25 SURGICAL SUPPLY — 4 items
CABLE SURGICAL S-101-97-12 (CABLE) ×2 IMPLANT
MONITOR CARDIAC ASSERT IQ EL (Prosthesis & Implant Heart) IMPLANT
PAD DEFIB RADIO PHYSIO CONN (PAD) ×2 IMPLANT
TRAY PACEMAKER INSERTION (PACKS) ×2 IMPLANT

## 2021-11-25 NOTE — Discharge Instructions (Signed)
Post procedure wound care instructions Keep incision clean and dry for 3 days. You can remove outer dressing tomorrow. Leave steri-strips (little pieces of tape) on until seen in the office for wound check appointment. Call the office ([phone]) for redness, drainage, swelling, or fever.  

## 2021-11-25 NOTE — Progress Notes (Addendum)
Dr Myna Hidalgo notified of patients increasing agitation, IV Haldol  x 1 dose ordered and given per orders.

## 2021-11-25 NOTE — Progress Notes (Addendum)
PROGRESS NOTE    ELBIE STATZER  OXB:[id number] DOB: [birth year] DOA: 11/24/2021  PCP: Deland Pretty, MD    Brief Narrative:  This 76 yrs old Male with PMH significant for PAF on Xarelto, Chronic vascular dementia, bipolar disorder, chronic back pain on narcotics, hypertension, hyperlipidemia presented in this ED with syncope and bradycardia.  Patient was at church where he suddenly collapsed and fell, which was witnessed by bystanders.  His wife at bedside found the patient was cold and clammy,  Patient was found to be bradycardic with a heart rate in less than 30 when EMS arrived.  Patient recovered, became conscious after EMS arrived but cannot recall any prodrome.  Patient did lose control of urine during the episode.  As per wife patient has chronic PAF, not on any rate controlling medications.  In the ED patient was found to be in A-fib with heart rate in 40s to 60s with frequent pauses longest one was 3.5 seconds.  Blood pressure is maintained.  Patient is admitted for further evaluation.  Cardiology is consulted recommended loop recorder.  Assessment & Plan:   Principal Problem:   Bradycardia Active Problems:   Syncope, cardiogenic   Syncope: Likely cardiogenic from bradycardia/sick sinus syndrome. Patient syncopized while in church, again at home. Heart rate remains in 40s to 50s with frequent pauses. Cardiology is consulted for further evaluation. Keep atropine at bedside for heart rate below 35 as needed Cardiology recommended loop recorder, waiting on PPM given significant dementia. CT head no acute intracranial abnormality found.  Essential hypertension: Hold off beta-blockers and CCA. Continue as needed hydralazine.  Bipolar disorder: Continue Depakote and Lamictal.   Mental status is baseline.  Chronic back pain Continue gabapentin and Norco.  Advanced vascular dementia with Parkinson's features: Continue Aricept,  Namenda and Sinemet.   DVT prophylaxis:  Xarelto Code Status:Full code Family Communication: Wife at bed side. Disposition Plan:   Status is: Inpatient Remains inpatient appropriate because: Admitted for syncope likely cardiogenic from bradycardia.  Cardiology is consulted recommended loop recorder, awaiting on PPM due to advanced dementia.   Anticipate discharge home in 1 to 2 days.   Consultants:  Cardiology  Procedures: None  Antimicrobials: None  Subjective: Patient was seen and examined at bedside.  Overnight events noted.  Patient appears very deconditioned. Patient does have dementia.  Wife at bedside.  He is scheduled to have loop recorder.  Objective: Vitals:   11/24/21 1910 11/24/21 2010 11/25/21 0825 11/25/21 1215  BP: (!) 175/81 124/76 139/71 (!) 155/79  Pulse:  68 (!) 58 (!) 55  Resp:  '20 12 20  '$ Temp:  (!) 97.4 F (36.3 C) 97.7 F (36.5 C) 97.6 F (36.4 C)  TempSrc:  Oral Oral Oral  SpO2:      Weight:      Height:        Intake/Output Summary (Last 24 hours) at 11/25/2021 1319 Last data filed at 11/25/2021 1210 Gross per 24 hour  Intake 3 ml  Output 525 ml  Net -522 ml   Filed Weights   11/24/21 1223 11/24/21 1849  Weight: 97.5 kg 86.2 kg    Examination:  General exam: Appears comfortable, not in any acute distress.  Deconditioned. Respiratory system: CTA bilaterally, no wheezing, no crackles, normal respiratory effort. Cardiovascular system: S1 & S2 heard, rate and rhythm, no murmur. Gastrointestinal system: Abdomen is soft, non tender, non distended, BS+ Central nervous system: Alert and oriented x 2. No focal neurological deficits. Extremities: No edema, no cyanosis,  no clubbing. Skin: No rashes, lesions or ulcers Psychiatry: Judgement and insight appear normal. Mood & affect appropriate.     Data Reviewed: I have personally reviewed following labs and imaging studies  CBC: Recent Labs  Lab 11/24/21 1305 11/25/21 0728  WBC 4.7 5.8  NEUTROABS 3.2  --   HGB 11.2* 11.2*   HCT 34.7* 35.4*  MCV 100.3* 99.4  PLT 116* 188*   Basic Metabolic Panel: Recent Labs  Lab 11/24/21 1305 11/25/21 0728  NA 141 140  K 3.8 4.1  CL 108 109  CO2 30 27  GLUCOSE 143* 105*  BUN 18 18  CREATININE 1.35* 1.31*  CALCIUM 9.7 9.8  MG 1.9  --    GFR: Estimated Creatinine Clearance: 50.5 mL/min (A) (by C-G formula based on SCr of 1.31 mg/dL (H)). Liver Function Tests: Recent Labs  Lab 11/24/21 1305  AST 21  ALT 6  ALKPHOS 67  BILITOT 0.7  PROT 5.8*  ALBUMIN 3.1*   No results for input(s): "LIPASE", "AMYLASE" in the last 168 hours. No results for input(s): "AMMONIA" in the last 168 hours. Coagulation Profile: No results for input(s): "INR", "PROTIME" in the last 168 hours. Cardiac Enzymes: No results for input(s): "CKTOTAL", "CKMB", "CKMBINDEX", "TROPONINI" in the last 168 hours. BNP (last 3 results) No results for input(s): "PROBNP" in the last 8760 hours. HbA1C: No results for input(s): "HGBA1C" in the last 72 hours. CBG: Recent Labs  Lab 11/25/21 1212  GLUCAP 105*   Lipid Profile: No results for input(s): "CHOL", "HDL", "LDLCALC", "TRIG", "CHOLHDL", "LDLDIRECT" in the last 72 hours. Thyroid Function Tests: Recent Labs    11/24/21 1524  TSH 1.759  FREET4 0.88   Anemia Panel: No results for input(s): "VITAMINB12", "FOLATE", "FERRITIN", "TIBC", "IRON", "RETICCTPCT" in the last 72 hours. Sepsis Labs: No results for input(s): "PROCALCITON", "LATICACIDVEN" in the last 168 hours.  No results found for this or any previous visit (from the past 240 hour(s)).   Radiology Studies: CT Head Wo Contrast  Result Date: 11/24/2021 CLINICAL DATA:  Syncope EXAM: CT HEAD WITHOUT CONTRAST TECHNIQUE: Contiguous axial images were obtained from the base of the skull through the vertex without intravenous contrast. RADIATION DOSE REDUCTION: This exam was performed according to the departmental dose-optimization program which includes automated exposure control,  adjustment of the mA and/or kV according to patient size and/or use of iterative reconstruction technique. COMPARISON:  CT brain 09/20/2021 FINDINGS: Brain: No acute territorial infarction, hemorrhage or intracranial mass. Encephalomalacia within the right frontal and parietal lobes. Atrophy and chronic small vessel ischemic changes of the white matter. Stable ventricle size. Vascular: No hyperdense vessels.  Carotid vascular calcification. Skull: Normal. Negative for fracture or focal lesion. Sinuses/Orbits: Mild mucosal thickening in the maxillary sinuses. Other: None IMPRESSION: 1. No CT evidence for acute intracranial abnormality. 2. Atrophy and chronic small vessel ischemic changes of the white matter. Chronic right frontal and parietal infarcts. Electronically Signed   By: Donavan Foil M.D.   On: 11/24/2021 15:28   DG Chest Portable 1 View  Result Date: 11/24/2021 CLINICAL DATA:  Syncope and bradycardia EXAM: PORTABLE CHEST 1 VIEW COMPARISON:  09/20/2021 chest radiograph. FINDINGS: Stable cardiomediastinal silhouette with mild cardiomegaly. No pneumothorax. No pleural effusion. Lungs appear clear, with no acute consolidative airspace disease and no pulmonary edema. IMPRESSION: Stable mild cardiomegaly without pulmonary edema. No active pulmonary disease. Electronically Signed   By: Ilona Sorrel M.D.   On: 11/24/2021 13:09    Scheduled Meds:  atorvastatin  40 mg  Oral QHS   carbidopa-levodopa  1 tablet Oral BID   chlorhexidine  60 mL Topical Once   chlorhexidine  60 mL Topical Once   divalproex  250 mg Oral Daily   docusate sodium  100 mg Oral BID   donepezil  10 mg Oral QHS   fluocinonide gel  1 Application Topical BID   gabapentin  200 mg Oral BID   gentamicin (GARAMYCIN) 80 mg in sodium chloride 0.9 % 500 mL irrigation  80 mg Irrigation On Call   lamoTRIgine  150 mg Oral BID   memantine  10 mg Oral BID   rivaroxaban  20 mg Oral Daily   senna  2 tablet Oral QHS   sodium chloride flush  3  mL Intravenous Q12H   sodium chloride flush  3 mL Intravenous Q12H   Continuous Infusions:  sodium chloride     sodium chloride      ceFAZolin (ANCEF) IV       LOS: 1 day    Time spent: 50 mins    Angelena Sand, MD [facility]   If 7PM-7AM, please contact night-coverage

## 2021-11-25 NOTE — Progress Notes (Addendum)
Rounding Note    Patient Name: [name] [name] Date of Encounter: 11/25/2021  [place] Cardiologist: Quay Burow, MD   Subjective   Awake, alert, cooperative  Inpatient Medications    Scheduled Meds:  atorvastatin  40 mg Oral QHS   carbidopa-levodopa  1 tablet Oral BID   divalproex  250 mg Oral Daily   docusate sodium  100 mg Oral BID   donepezil  10 mg Oral QHS   fluocinonide gel  1 Application Topical BID   gabapentin  200 mg Oral BID   lamoTRIgine  150 mg Oral BID   memantine  10 mg Oral BID   senna  2 tablet Oral QHS   sodium chloride flush  3 mL Intravenous Q12H   Continuous Infusions:  PRN Meds: atropine, hydrALAZINE, HYDROcodone-acetaminophen, ondansetron   Vital Signs    Vitals:   11/24/21 1849 11/24/21 1910 11/24/21 2010 11/25/21 0825  BP: (!) 175/81 (!) 175/81 124/76 139/71  Pulse: 66  68 (!) 58  Resp: _0 Temp: 97.6 F (36.4 C)  (!) 97.4 F (36.3 C) 97.7 F (36.5 C)  TempSrc: Oral  Oral Oral  SpO2:      Weight: 86.2 kg     Height: _1  (1.753 m)       Intake/Output Summary (Last 24 hours) at 11/25/2021 1041 Last data filed at 11/25/2021 3491 Gross per 24 hour  Intake --  Output 475 ml  Net -475 ml      11/24/2021    6:49 PM 11/24/2021   12:23 PM 09/20/2021    4:26 AM  Last 3 Weights  Weight (lbs) 190 lb 215 lb 215 lb  Weight (kg) 86.183 kg 97.523 kg 97.523 kg      Telemetry    AFib 40's-60, no significant bradycardia or long pauses - Personally Reviewed  ECG    No new EKGs - Personally Reviewed  Physical Exam   GEN: No acute distress.   Neck: No JVD Cardiac: irreg-irreg, no murmurs, rubs, or gallops.  Respiratory: CTA b/l. GI: Soft, nontender, non-distended  MS: No edema; No deformity. Neuro:  follows directions, known dementia Psych: cooperative  Labs    High Sensitivity Troponin:   Recent Labs  Lab 11/24/21 1305 11/24/21 1524  TROPONINIHS 8 10     Chemistry Recent Labs  Lab  11/24/21 1305 11/25/21 0728  NA 141 140  K 3.8 4.1  CL 108 109  CO2 30 27  GLUCOSE 143* 105*  BUN 18 18  CREATININE 1.35* 1.31*  CALCIUM 9.7 9.8  MG 1.9  --   PROT 5.8*  --   ALBUMIN 3.1*  --   AST 21  --   ALT 6  --   ALKPHOS 67  --   BILITOT 0.7  --   GFRNONAA 54* 56*  ANIONGAP 3* 4*    Lipids No results for input(s): "CHOL", "TRIG", "HDL", "LABVLDL", "LDLCALC", "CHOLHDL" in the last 168 hours.  Hematology Recent Labs  Lab 11/24/21 1305 11/25/21 0728  WBC 4.7 5.8  RBC 3.46* 3.56*  HGB 11.2* 11.2*  HCT 34.7* 35.4*  MCV 100.3* 99.4  MCH 32.4 31.5  MCHC 32.3 31.6  RDW 13.3 13.2  PLT 116* 126*   Thyroid  Recent Labs  Lab 11/24/21 1524  TSH 1.759  FREET4 0.88    BNPNo results for input(s): "BNP", "PROBNP" in the last 168 hours.  DDimer  Recent Labs  Lab 11/24/21 1305  DDIMER 0.34  Radiology    CT Head Wo Contrast  Result Date: 11/24/2021 CLINICAL DATA:  Syncope EXAM: CT HEAD WITHOUT CONTRAST TECHNIQUE: Contiguous axial images were obtained from the base of the skull through the vertex without intravenous contrast. RADIATION DOSE REDUCTION: This exam was performed according to the departmental dose-optimization program which includes automated exposure control, adjustment of the mA and/or kV according to patient size and/or use of iterative reconstruction technique. COMPARISON:  CT brain 09/20/2021 FINDINGS: Brain: No acute territorial infarction, hemorrhage or intracranial mass. Encephalomalacia within the right frontal and parietal lobes. Atrophy and chronic small vessel ischemic changes of the white matter. Stable ventricle size. Vascular: No hyperdense vessels.  Carotid vascular calcification. Skull: Normal. Negative for fracture or focal lesion. Sinuses/Orbits: Mild mucosal thickening in the maxillary sinuses. Other: None IMPRESSION: 1. No CT evidence for acute intracranial abnormality. 2. Atrophy and chronic small vessel ischemic changes of the white  matter. Chronic right frontal and parietal infarcts. Electronically Signed   By: Donavan Foil M.D.   On: 11/24/2021 15:28   DG Chest Portable 1 View  Result Date: 11/24/2021 CLINICAL DATA:  Syncope and bradycardia EXAM: PORTABLE CHEST 1 VIEW COMPARISON:  09/20/2021 chest radiograph. FINDINGS: Stable cardiomediastinal silhouette with mild cardiomegaly. No pneumothorax. No pleural effusion. Lungs appear clear, with no acute consolidative airspace disease and no pulmonary edema. IMPRESSION: Stable mild cardiomegaly without pulmonary edema. No active pulmonary disease. Electronically Signed   By: Ilona Sorrel M.D.   On: 11/24/2021 13:09    Cardiac Studies    03/18/2013; TTE Study Conclusions  - Left ventricle: The cavity size was normal. Systolic    function was normal. The estimated ejection fraction was    in the range of 50% to 55%. Wall motion was normal; there    were no regional wall motion abnormalities.  - Left atrium: The atrium was mildly dilated.  - Right atrium: The atrium was mildly dilated.   Patient Profile     76 y.o. male w/PMHx of HTN, HLD, AFib (permanent), stroke, dementia, bpolar d/o, tremor admitted after a syncopal event at church and slow HRs by EMS with rates 28-40's  Assessment & Plan    Syncope Symptomatic bradycardia Carotid sinus hypersensitivity Planned for PPM today with no reversible causes noted VSS Wife/daughter are bedside Answered their follow up questions, remain agreeable to proceed  4. Permanent Afib Home [place] held for implant Resume post pacing pending pocket stability + hx of stroke   Deferred to IM/attending team: 5 HTN 6 HLD 7 Dementia Overnight agitation 8 Bipolar d/o 9 tremor  For questions or updates, please contact [place] Please consult [email or web address] for contact info under        Signed, Baldwin [place], PA-C  11/25/2021, 10:41 AM    Lengthy discussion with the patient's wife and daughter; he did not  recall [place] having met yesterday.  The history actually includes another syncopal episode in February funeral.  It was ascribed to having taken the wrong medications.  Reviewing the synopsis data, he has had bradycardia dating back to 2019 albeit and during that hospitalization he was having orthopedic surgery.  Heart rates have been slow in the interim still.  However, the likelihood of bradycardia arrhythmic syncope while not 0 I think is low and lower with a history of prior syncope and longstanding bradycardia.  Hence, we have discussed 30-day monitoring, implantable monitoring as well as pacing.  His dementia is really quite profound and I worry about postprocedural and periprocedural  complications hence, would like to have a greater likelihood of benefit before we undertake an invasive procedure and we have settled on the implantation of a loop recorder to that end.  We will continue him on his memantine and donezepil.  He does not drive.  We will resume his rivaroxaban.  His calculated dose using CKI algorithm is 20 mg based on a GFR of 54.  Epic comes up with a lower value.  For now we will continue the 20  From our point of view could probably go home later today.

## 2021-11-25 NOTE — Discharge Summary (Signed)
Physician Discharge Summary  [name] [name] TIR:[id number] DOB: 1945/09/14 DOA: 11/24/2021  PCP: Deland Pretty, MD  Admit date: 11/24/2021  Discharge date: 11/25/2021  Admitted From: Home. Disposition:  Home.  Recommendations for Outpatient Follow-up:  Follow up with PCP in 1-2 weeks. Please obtain BMP/CBC in one week Advised to follow-up with cardiology as scheduled. Patient is being discharged with a loop recorder inserted,  follow-up monitoring.  Home Health: None Equipment/Devices:None  Discharge Condition: Stable CODE STATUS:Full code Diet recommendation: Heart Healthy  Brief [facility] Course: This 76 yrs old Male with PMH significant for PAF on Xarelto, Chronic vascular dementia, bipolar disorder, chronic back pain on narcotics, hypertension, hyperlipidemia presented in this ED with syncope and bradycardia.  Patient was at church where he suddenly collapsed and fell, which was witnessed by bystanders.  His wife at bedside found the patient was cold and clammy,  Patient was found to be bradycardic with a heart rate in less than 30 when EMS arrived. Patient recovered, became conscious after EMS arrived but cannot recall any prodrome.  Patient did lose control of urine during that episode.  As per wife patient has chronic PAF, not on any rate controlling medications.  In the ED patient was found to be in A-fib with heart rate in 40s to 60s with frequent pauses longest one was 3.5 seconds.  Blood pressure is maintained.  Patient was admitted for further evaluation.  Cardiology was consulted and recommended loop recorder.  Patient has advanced dementia, would be difficult to put PPM.  Patient was cleared from cardiology to be discharged.  Patient and family agreed.  Patient is being discharged home after loop recorder was inserted.   Discharge Diagnoses:  Principal Problem:   Bradycardia Active Problems:   Syncope, cardiogenic  Syncope: Likely cardiogenic from  bradycardia/sick sinus syndrome. Patient syncopized while in church, again at home. Heart rate remains in 40s to 50s with frequent pauses. Cardiology is consulted for further evaluation. Keep atropine at bedside for heart rate below 35 as needed Cardiology recommended loop recorder, waiting on PPM given significant dementia. CT head no acute intracranial abnormality found. Patient cleared for discharge after loop recorder insertion.   Essential hypertension: Hold off beta-blockers and CCA. Continue as needed hydralazine.   Bipolar disorder: Continue Depakote and Lamictal.   Mental status is baseline.   Chronic back pain Continue gabapentin and Norco.   Advanced vascular dementia with Parkinson's features: Continue Aricept,  Namenda and Sinemet.    Discharge Instructions  Discharge Instructions     Call MD for:  difficulty breathing, headache or visual disturbances   Complete by: As directed    Call MD for:  persistant dizziness or light-headedness   Complete by: As directed    Call MD for:  persistant nausea and vomiting   Complete by: As directed    Diet - low sodium heart healthy   Complete by: As directed    Diet Carb Modified   Complete by: As directed    Discharge instructions   Complete by: As directed    Advised to follow-up with primary care physician in 1 week. Advised to follow-up with cardiology as scheduled. Patient is being discharged with a loop recorder inserted follow-up monitoring.   Increase activity slowly   Complete by: As directed       Allergies as of 11/25/2021       Reactions   Latex Rash, Other (See Comments)   Reaction to knee wraps  Medication List     STOP taking these medications    HYDROcodone-acetaminophen 5-325 MG tablet Commonly known as: NORCO/VICODIN   ondansetron 4 MG tablet Commonly known as: ZOFRAN       TAKE these medications    atorvastatin 40 MG tablet Commonly known as: LIPITOR Take 40 mg by  mouth every evening.   b complex vitamins tablet Take 1 tablet by mouth daily.   carbidopa-levodopa 25-250 MG tablet Commonly known as: SINEMET IR Take 1 tablet by mouth 2 (two) times daily.   divalproex 250 MG DR tablet Commonly known as: DEPAKOTE Take 1 tablet by mouth every evening.   docusate sodium 100 MG capsule Commonly known as: COLACE Take 1 capsule (100 mg total) by mouth 2 (two) times daily.   donepezil 10 MG tablet Commonly known as: ARICEPT Take 1 tablet (10 mg total) by mouth at bedtime.   fluocinonide gel 0.05 % Commonly known as: LIDEX Apply 1 application topically 2 (two) times daily.   folic acid 1 MG tablet Commonly known as: FOLVITE Take 1 mg by mouth at bedtime.   gabapentin 300 MG capsule Commonly known as: NEURONTIN Take 1 capsule by mouth 2 (two) times daily.   lamoTRIgine 150 MG tablet Commonly known as: LAMICTAL Take 150 mg by mouth 2 (two) times daily.   lisinopril 10 MG tablet Commonly known as: ZESTRIL Take 10 mg by mouth every evening.   memantine 10 MG tablet Commonly known as: NAMENDA Take 10 mg by mouth 2 (two) times daily.   metroNIDAZOLE 0.75 % gel Commonly known as: METROGEL Apply 1 application topically 2 (two) times daily.   Repatha SureClick 756 MG/ML Soaj Generic drug: Evolocumab Inject 140 mg into the skin every 14 (fourteen) days.   rivaroxaban 20 MG Tabs tablet Commonly known as: XARELTO Take 20 mg by mouth daily.   senna 8.6 MG Tabs tablet Commonly known as: SENOKOT Take 2 tablets (17.2 mg total) by mouth at bedtime.        Follow-up Information     Deboraha Sprang, MD Follow up.   Specialty: Cardiology Why: one of Dr. Olin Pia nurses will call to follow up on wound healing and follow appointments. Contact information: [address] [address] [phone]         Deland Pretty, MD Follow up in 1 week(s).   Specialty: Internal Medicine Contact information: [address] [place] [address] [phone]         Lorretta Harp, MD .   Specialties: Cardiology, Radiology Contact information: [address] [place] [address] [phone]                Allergies  Allergen Reactions   Latex Rash and Other (See Comments)    Reaction to knee wraps    Consultations: Cardiology  Procedures/Studies: CT Head Wo Contrast  Result Date: 11/24/2021 CLINICAL DATA:  Syncope EXAM: CT HEAD WITHOUT CONTRAST TECHNIQUE: Contiguous axial images were obtained from the base of the skull through the vertex without intravenous contrast. RADIATION DOSE REDUCTION: This exam was performed according to the departmental dose-optimization program which includes automated exposure control, adjustment of the mA and/or kV according to patient size and/or use of iterative reconstruction technique. COMPARISON:  CT brain 09/20/2021 FINDINGS: Brain: No acute territorial infarction, hemorrhage or intracranial mass. Encephalomalacia within the right frontal and parietal lobes. Atrophy and chronic small vessel ischemic changes of the white matter. Stable ventricle size. Vascular:  No hyperdense vessels.  Carotid vascular calcification. Skull: Normal. Negative for fracture or focal lesion. Sinuses/Orbits: Mild mucosal thickening in the maxillary sinuses. Other: None IMPRESSION: 1. No CT evidence for acute intracranial abnormality. 2. Atrophy and chronic small vessel ischemic changes of the white matter. Chronic right frontal and parietal infarcts. Electronically Signed   By: Donavan Foil M.D.   On: 11/24/2021 15:28   DG Chest Portable 1 View  Result Date: 11/24/2021 CLINICAL DATA:  Syncope and bradycardia EXAM: PORTABLE CHEST 1 VIEW COMPARISON:  09/20/2021 chest radiograph. FINDINGS: Stable cardiomediastinal silhouette with mild cardiomegaly. No pneumothorax. No pleural effusion. Lungs appear clear, with no acute consolidative airspace  disease and no pulmonary edema. IMPRESSION: Stable mild cardiomegaly without pulmonary edema. No active pulmonary disease. Electronically Signed   By: Ilona Sorrel M.D.   On: 11/24/2021 13:09   Loop recorder  Subjective: Patient was seen and examined at bedside.  Overnight events noted. Patient reports feeling better denies any dizziness lightheadedness. Patient underwent loop recorder insertion tolerated well.  Cleared from cardiology to be discharged. Family and patient is agreeable with the discharge planning.  Discharge Exam: Vitals:   11/25/21 0825 11/25/21 1215  BP: 139/71 (!) 155/79  Pulse: (!) 58 (!) 55  Resp: 12 20  Temp: 97.7 F (36.5 C) 97.6 F (36.4 C)  SpO2:     Vitals:   11/24/21 1910 11/24/21 2010 11/25/21 0825 11/25/21 1215  BP: (!) 175/81 124/76 139/71 (!) 155/79  Pulse:  68 (!) 58 (!) 55  Resp:  '20 12 20  '$ Temp:  (!) 97.4 F (36.3 C) 97.7 F (36.5 C) 97.6 F (36.4 C)  TempSrc:  Oral Oral Oral  SpO2:      Weight:      Height:        General: Pt is alert, awake, not in acute distress Cardiovascular: RRR, S1/S2 +, no rubs, no gallops, loop recorder inserted. Respiratory: CTA bilaterally, no wheezing, no rhonchi Abdominal: Soft, NT, ND, bowel sounds + Extremities: no edema, no cyanosis    The results of significant diagnostics from this hospitalization (including imaging, microbiology, ancillary and laboratory) are listed below for reference.     Microbiology: No results found for this or any previous visit (from the past 240 hour(s)).   Labs: BNP (last 3 results) No results for input(s): "BNP" in the last 8760 hours. Basic Metabolic Panel: Recent Labs  Lab 11/24/21 1305 11/25/21 0728  NA 141 140  K 3.8 4.1  CL 108 109  CO2 30 27  GLUCOSE 143* 105*  BUN 18 18  CREATININE 1.35* 1.31*  CALCIUM 9.7 9.8  MG 1.9  --    Liver Function Tests: Recent Labs  Lab 11/24/21 1305  AST 21  ALT 6  ALKPHOS 67  BILITOT 0.7  PROT 5.8*  ALBUMIN  3.1*   No results for input(s): "LIPASE", "AMYLASE" in the last 168 hours. No results for input(s): "AMMONIA" in the last 168 hours. CBC: Recent Labs  Lab 11/24/21 1305 11/25/21 0728  WBC 4.7 5.8  NEUTROABS 3.2  --   HGB 11.2* 11.2*  HCT 34.7* 35.4*  MCV 100.3* 99.4  PLT 116* 126*   Cardiac Enzymes: No results for input(s): "CKTOTAL", "CKMB", "CKMBINDEX", "TROPONINI" in the last 168 hours. BNP: Invalid input(s): "POCBNP" CBG: Recent Labs  Lab 11/25/21 1212  GLUCAP 105*   D-Dimer Recent Labs    11/24/21 1305  DDIMER 0.34   Hgb A1c No results for input(s): "HGBA1C" in the last 72  hours. Lipid Profile No results for input(s): "CHOL", "HDL", "LDLCALC", "TRIG", "CHOLHDL", "LDLDIRECT" in the last 72 hours. Thyroid function studies Recent Labs    11/24/21 1524  TSH 1.759   Anemia work up No results for input(s): "VITAMINB12", "FOLATE", "FERRITIN", "TIBC", "IRON", "RETICCTPCT" in the last 72 hours. Urinalysis    Component Value Date/Time   COLORURINE YELLOW 03/17/2013 2048   APPEARANCEUR CLEAR 03/17/2013 2048   LABSPEC 1.012 03/17/2013 2048   PHURINE 8.0 03/17/2013 2048   GLUCOSEU NEGATIVE 03/17/2013 2048   HGBUR NEGATIVE 03/17/2013 2048   BILIRUBINUR NEGATIVE 03/17/2013 2048   KETONESUR 15 (A) 03/17/2013 2048   PROTEINUR NEGATIVE 03/17/2013 2048   UROBILINOGEN 0.2 03/17/2013 2048   NITRITE NEGATIVE 03/17/2013 2048   LEUKOCYTESUR NEGATIVE 03/17/2013 2048   Sepsis Labs Recent Labs  Lab 11/24/21 1305 11/25/21 0728  WBC 4.7 5.8   Microbiology No results found for this or any previous visit (from the past 240 hour(s)).   Time coordinating discharge: Over 30 minutes  SIGNED:   Shawna Clamp, MD  [facility] 11/25/2021, 4:05 PM Pager   If 7PM-7AM, please contact night-coverage

## 2021-11-25 NOTE — Care Management (Signed)
  Transition of Care (TOC) Screening Note   Patient Details  Name: [name] [name] Date of Birth: [birth year]   Transition of Care [facility]) CM/SW Contact:    Bethena Roys, RN Phone Number: 11/25/2021, 2:41 PM    Transition of Care Department [facility]) has reviewed the patient and no TOC needs have been identified at this time. Case Manager will continue to monitor patient advancement through interdisciplinary progression rounds. If new patient transition needs arise, please place a TOC consult.

## 2021-11-26 ENCOUNTER — Encounter (HOSPITAL_COMMUNITY): Payer: Self-pay | Admitting: Internal Medicine

## 2021-12-16 ENCOUNTER — Telehealth: Payer: Self-pay | Admitting: Cardiovascular Disease

## 2021-12-16 NOTE — Telephone Encounter (Signed)
I ordered the patient a phone monitor.  He should receive it in 3-5 days but told his wife 7-10 business days just in case it still on back order.

## 2021-12-16 NOTE — Telephone Encounter (Signed)
Wife stated patient lost the phone and charger for his heart monitor and she would like to get a replacement.

## 2021-12-19 ENCOUNTER — Emergency Department (HOSPITAL_COMMUNITY): Payer: Medicare PPO

## 2021-12-19 ENCOUNTER — Emergency Department (HOSPITAL_COMMUNITY)
Admission: EM | Admit: 2021-12-19 | Discharge: 2021-12-19 | Disposition: A | Discharge status: Home / Self Care | Payer: Medicare PPO | Attending: Emergency Medicine | Admitting: Emergency Medicine

## 2021-12-19 ENCOUNTER — Encounter (HOSPITAL_COMMUNITY): Payer: Self-pay | Admitting: Emergency Medicine

## 2021-12-19 ENCOUNTER — Other Ambulatory Visit: Payer: Self-pay

## 2021-12-19 DIAGNOSIS — W01198A Fall on same level from slipping, tripping and stumbling with subsequent striking against other object, initial encounter: Secondary | ICD-10-CM | POA: Insufficient documentation

## 2021-12-19 DIAGNOSIS — Z79899 Other long term (current) drug therapy: Secondary | ICD-10-CM | POA: Diagnosis not present

## 2021-12-19 DIAGNOSIS — Z87891 Personal history of nicotine dependence: Secondary | ICD-10-CM | POA: Insufficient documentation

## 2021-12-19 DIAGNOSIS — I6381 Other cerebral infarction due to occlusion or stenosis of small artery: Secondary | ICD-10-CM | POA: Diagnosis not present

## 2021-12-19 DIAGNOSIS — I4891 Unspecified atrial fibrillation: Secondary | ICD-10-CM | POA: Insufficient documentation

## 2021-12-19 DIAGNOSIS — Z043 Encounter for examination and observation following other accident: Secondary | ICD-10-CM | POA: Diagnosis not present

## 2021-12-19 DIAGNOSIS — Y92481 Parking lot as the place of occurrence of the external cause: Secondary | ICD-10-CM | POA: Insufficient documentation

## 2021-12-19 DIAGNOSIS — S299XXA Unspecified injury of thorax, initial encounter: Secondary | ICD-10-CM | POA: Diagnosis not present

## 2021-12-19 DIAGNOSIS — Z91040 Latex allergy status: Secondary | ICD-10-CM | POA: Diagnosis not present

## 2021-12-19 DIAGNOSIS — G309 Alzheimer's disease, unspecified: Secondary | ICD-10-CM | POA: Diagnosis not present

## 2021-12-19 DIAGNOSIS — Z7901 Long term (current) use of anticoagulants: Secondary | ICD-10-CM | POA: Insufficient documentation

## 2021-12-19 DIAGNOSIS — I10 Essential (primary) hypertension: Secondary | ICD-10-CM | POA: Insufficient documentation

## 2021-12-19 DIAGNOSIS — S199XXA Unspecified injury of neck, initial encounter: Secondary | ICD-10-CM | POA: Diagnosis not present

## 2021-12-19 DIAGNOSIS — S0990XA Unspecified injury of head, initial encounter: Secondary | ICD-10-CM | POA: Diagnosis not present

## 2021-12-19 DIAGNOSIS — Z8673 Personal history of transient ischemic attack (TIA), and cerebral infarction without residual deficits: Secondary | ICD-10-CM | POA: Insufficient documentation

## 2021-12-19 DIAGNOSIS — W19XXXA Unspecified fall, initial encounter: Secondary | ICD-10-CM | POA: Diagnosis not present

## 2021-12-19 DIAGNOSIS — I517 Cardiomegaly: Secondary | ICD-10-CM | POA: Diagnosis not present

## 2021-12-19 DIAGNOSIS — Z96641 Presence of right artificial hip joint: Secondary | ICD-10-CM | POA: Insufficient documentation

## 2021-12-19 DIAGNOSIS — S0083XA Contusion of other part of head, initial encounter: Secondary | ICD-10-CM | POA: Insufficient documentation

## 2021-12-19 DIAGNOSIS — R519 Headache, unspecified: Secondary | ICD-10-CM | POA: Diagnosis not present

## 2021-12-19 DIAGNOSIS — R001 Bradycardia, unspecified: Secondary | ICD-10-CM | POA: Diagnosis not present

## 2021-12-19 LAB — COMPREHENSIVE METABOLIC PANEL
ALT: <5 U/L (ref 0–44)
AST: 37 U/L (ref 15–41)
Albumin: 3.5 g/dL (ref 3.5–5.0)
Alkaline Phosphatase: 74 U/L (ref 38–126)
Anion gap: 5 (ref 5–15)
BUN: 15 mg/dL (ref 8–23)
CO2: 27 mmol/L (ref 22–32)
Calcium: 10 mg/dL (ref 8.9–10.3)
Chloride: 108 mmol/L (ref 98–111)
Creatinine, Ser: 1.34 mg/dL — ABNORMAL HIGH (ref 0.61–1.24)
GFR, Estimated: 54 mL/min — ABNORMAL LOW (ref >60)
Glucose, Bld: 100 mg/dL — ABNORMAL HIGH (ref 70–99)
Potassium: 5.1 mmol/L (ref 3.5–5.1)
Sodium: 140 mmol/L (ref 135–145)
Total Bilirubin: 1 mg/dL (ref 0.3–1.2)
Total Protein: 6 g/dL — ABNORMAL LOW (ref 6.5–8.1)

## 2021-12-19 LAB — CBC
HCT: 36.5 % — ABNORMAL LOW (ref 39.0–52.0)
Hemoglobin: 11.4 g/dL — ABNORMAL LOW (ref 13.0–17.0)
MCH: 31.4 pg (ref 26.0–34.0)
MCHC: 31.2 g/dL (ref 30.0–36.0)
MCV: 100.6 fL — ABNORMAL HIGH (ref 80.0–100.0)
Platelets: 117 10*3/uL — ABNORMAL LOW (ref 150–400)
RBC: 3.63 MIL/uL — ABNORMAL LOW (ref 4.22–5.81)
RDW: 13.2 % (ref 11.5–15.5)
WBC: 5.3 10*3/uL (ref 4.0–10.5)
nRBC: 0 % (ref 0.0–0.2)

## 2021-12-19 LAB — I-STAT CHEM 8, ED
BUN: 21 mg/dL (ref 8–23)
Calcium, Ion: 1.31 mmol/L (ref 1.15–1.40)
Chloride: 104 mmol/L (ref 98–111)
Creatinine, Ser: 1.3 mg/dL — ABNORMAL HIGH (ref 0.61–1.24)
Glucose, Bld: 95 mg/dL (ref 70–99)
HCT: 35 % — ABNORMAL LOW (ref 39.0–52.0)
Hemoglobin: 11.9 g/dL — ABNORMAL LOW (ref 13.0–17.0)
Potassium: 5.7 mmol/L — ABNORMAL HIGH (ref 3.5–5.1)
Sodium: 140 mmol/L (ref 135–145)
TCO2: 30 mmol/L (ref 22–32)

## 2021-12-19 LAB — PROTIME-INR
INR: 1.9 — ABNORMAL HIGH (ref 0.8–1.2)
Prothrombin Time: 21.7 seconds — ABNORMAL HIGH (ref 11.4–15.2)

## 2021-12-19 MED ORDER — TETANUS-DIPHTH-ACELL PERTUSSIS 5-2.5-18.5 LF-MCG/0.5 IM SUSY: 0.5000 mL | PREFILLED_SYRINGE | Freq: Once | INTRAMUSCULAR | Status: DC

## 2021-12-19 MED ORDER — SODIUM CHLORIDE 0.9 % IV BOLUS
1000.0000 mL | Freq: Once | INTRAVENOUS | Status: AC
  Administered 2021-12-19: 1000 mL via INTRAVENOUS

## 2021-12-19 NOTE — ED Notes (Signed)
Cleansed pt's head wound and applied ice to the site.

## 2021-12-19 NOTE — ED Triage Notes (Addendum)
Pt arrives via EMS-  fall in doctor's office parking lot. Pt's wife states his walker tripped him up on the sidewalk. Pt hit head- on blood thinner. No LOC. Pt has dementia at baseline.Pt has hematoma with small abrasion to left forehead. Pt had hx of stroke with deficits, right eye drift per EMS. Pt denies feeling dizzy. BP 168/94, 62 HR, 94% on room air.

## 2021-12-19 NOTE — ED Notes (Signed)
Pt transported to CT ?

## 2021-12-19 NOTE — Progress Notes (Signed)
Orthopedic Tech Progress Note Patient Details:  [name] [name] [birth year] [id number]  Level 2 trauma, ortho tech services not needed at this time.  Patient ID: [name] [name], male   DOB: [birth year], 76 y.o.   MRN: [id number]  [name] 12/19/2021, 2:44 PM

## 2021-12-19 NOTE — ED Provider Notes (Addendum)
[facility] EMERGENCY DEPARTMENT Provider Note   CSN: [id number] Arrival date & time: 12/19/21  1358     History  Chief Complaint  Patient presents with   [name] [name] is a 76 y.o. male.  Patient as above with significant medical history as below, including Alzheimer's disease, bipolar disorder, A-fib, HLD, HTN, ambulates with walker who presents to the ED with complaint of fall.  Patient was outside this doctor's office, he was using his walker and missed the sidewalk, fell forward striking left side of his head on the ground.  No LOC.  This was witnessed by family from a few feet away.  No recent behavior changes, normal state of health prior to onset of presentation.  No nausea or vomiting, no chest pain or dyspnea, no change in bowel or bladder function.  Unsure of last tetanus shot.  He is on DOAC for his A-fib     Past Medical History:  Diagnosis Date   Alzheimer disease ([place])    Anxiety    Arthritis    Atrial fibrillation (HCC)    Chronic   Bipolar disorder (HCC)    Chronic low back pain    Dementia (HCC)    Depression    Dizziness    Dysrhythmia    a fib   ED (erectile dysfunction)    External hemorrhoids    Fatigue    HLD (hyperlipidemia)    HTN (hypertension)    Hypogonadism in male    Lipoma of skin    Memory loss    Mixed Alzheimer's and vascular dementia (HCC)    Poor balance    Restless legs syndrome (RLS)    Sleep apnea    cpap   Stroke ([place]) 1991   Tremor     Past Surgical History:  Procedure Laterality Date   COLONOSCOPY     KNEE ARTHROPLASTY Left 06/04/2017   Procedure: LEFT TOTAL KNEE ARTHROPLASTY WITH COMPUTER NAVIGATION;  Surgeon: Rod Can, MD;  Location: [facility];  Service: Orthopedics;  Laterality: Left;  NEEDS RNFA   LOOP RECORDER INSERTION N/A 11/25/2021   Procedure: LOOP RECORDER INSERTION;  Surgeon: Deboraha Sprang, MD;  Location: [place] [facility];  Service: Cardiovascular;  Laterality: N/A;    TONSILLECTOMY AND ADENOIDECTOMY     TOTAL HIP ARTHROPLASTY Right 07/31/2016   Procedure: RIGHT TOTAL HIP ARTHROPLASTY ANTERIOR APPROACH;  Surgeon: Rod Can, MD;  Location: [facility];  Service: Orthopedics;  Laterality: Right;  Requesting RNFA     The history is provided by the patient, the EMS personnel and a relative. No language interpreter was used.  Fall Pertinent negatives include no chest pain, no abdominal pain, no headaches and no shortness of breath.       Home Medications Prior to Admission medications   Medication Sig Start Date End Date Taking? Authorizing Provider  atorvastatin (LIPITOR) 40 MG tablet Take 40 mg by mouth every evening. 11/27/15   [provider]  b complex vitamins tablet Take 1 tablet by mouth daily.    [provider]  carbidopa-levodopa (SINEMET IR) 25-250 MG per tablet Take 1 tablet by mouth 2 (two) times daily.     [provider]  divalproex (DEPAKOTE) 250 MG DR tablet Take 1 tablet by mouth every evening. 10/28/19   [provider]  docusate sodium (COLACE) 100 MG capsule Take 1 capsule (100 mg total) by mouth 2 (two) times daily. 06/05/17   Rod Can, MD  donepezil (  ARICEPT) 10 MG tablet Take 1 tablet (10 mg total) by mouth at bedtime. 08/08/14   Tomi Likens, Adam R, DO  Evolocumab (REPATHA SURECLICK) 841 MG/ML SOAJ Inject 140 mg into the skin every 14 (fourteen) days.    [provider]  fluocinonide gel (LIDEX) 6.60 % Apply 1 application topically 2 (two) times daily.    [provider]  folic acid (FOLVITE) 1 MG tablet Take 1 mg by mouth at bedtime.     [provider]  gabapentin (NEURONTIN) 300 MG capsule Take 1 capsule by mouth 2 (two) times daily. 10/28/19   [provider]  lamoTRIgine (LAMICTAL) 150 MG tablet Take 150 mg by mouth 2 (two) times daily.    [provider]  lisinopril (PRINIVIL,ZESTRIL) 10 MG tablet Take 10 mg by mouth every evening.    [provider]  memantine (NAMENDA) 10 MG tablet Take 10 mg by mouth 2 (two) times daily.     [provider]  metroNIDAZOLE (METROGEL) 0.75 % gel Apply 1 application topically 2 (two) times daily.    [provider]  rivaroxaban (XARELTO) 20 MG TABS tablet Take 20 mg by mouth daily.    [provider]  senna (SENOKOT) 8.6 MG TABS tablet Take 2 tablets (17.2 mg total) by mouth at bedtime. 06/05/17   Swinteck, Aaron Edelman, MD      Allergies    Latex    Review of Systems   Review of Systems  Constitutional:  Negative for chills and fever.  HENT:  Negative for facial swelling and trouble swallowing.   Eyes:  Negative for photophobia and visual disturbance.  Respiratory:  Negative for cough and shortness of breath.   Cardiovascular:  Negative for chest pain and palpitations.  Gastrointestinal:  Negative for abdominal pain, nausea and vomiting.  Endocrine: Negative for polydipsia and polyuria.  Genitourinary:  Negative for difficulty urinating and hematuria.  Musculoskeletal:  Negative for gait problem and joint swelling.  Skin:  Positive for wound. Negative for pallor and rash.  Neurological:  Negative for syncope and headaches.  Psychiatric/Behavioral:  Negative for agitation and confusion.     Physical Exam Updated Vital Signs BP (!) 170/80   Temp (!) 97.3 F (36.3 C) (Oral)   Resp (!) 21   Ht '5\' 11"'$  (1.803 m)   Wt 81.6 kg   SpO2 97%   BMI 25.10 kg/m  Physical Exam Vitals and nursing note reviewed.  Constitutional:      General: He is not in acute distress.    Appearance: Normal appearance. He is well-developed.  HENT:     Head: Normocephalic. Abrasion present. No raccoon eyes or Battle's sign.     Jaw: There is normal jaw occlusion. No trismus.     Comments: Left forehead hematoma     Right Ear: External ear normal.     Left Ear: External ear normal.     Mouth/Throat:     Mouth: Mucous membranes are moist.     Pharynx: Oropharynx is clear. Uvula  midline.  Eyes:     General: No scleral icterus.    Extraocular Movements: Extraocular movements intact.     Pupils: Pupils are equal, round, and reactive to light.     Comments: Pupils 2 to 3 mm briskly reactive bilateral  Cardiovascular:     Rate and Rhythm: Normal rate and regular rhythm.     Pulses: Normal pulses.          Radial pulses are 2+ on the  right side and 2+ on the left side.       Dorsalis pedis pulses are 2+ on the right side and 2+ on the left side.     Heart sounds: Normal heart sounds.  Pulmonary:     Effort: Pulmonary effort is normal. No tachypnea, accessory muscle usage or respiratory distress.     Breath sounds: Normal breath sounds. No decreased breath sounds or wheezing.  Abdominal:     General: Abdomen is flat.     Palpations: Abdomen is soft.     Tenderness: There is no abdominal tenderness. There is no guarding or rebound.  Musculoskeletal:        General: Normal range of motion.     Cervical back: Full passive range of motion without pain and normal range of motion.     Right lower leg: No edema.     Left lower leg: No edema.     Comments: No midline spinous process tenderness to palpation or percussion, no crepitus or step-off.    Pelvis stable to AP pressure  No discomfort with logroll bilateral lower extremities  Moving extremities freely without discomfort  Skin:    General: Skin is warm and dry.     Capillary Refill: Capillary refill takes less than 2 seconds.  Neurological:     Mental Status: He is alert. Mental status is at baseline.     GCS: GCS eye subscore is 4. GCS verbal subscore is 5. GCS motor subscore is 6.     Cranial Nerves: Cranial nerves 2-12 are intact.     Sensory: Sensation is intact.     Motor: Motor function is intact.     Coordination: Coordination is intact.     Comments: Gait not tested secondary to patient safety Aox2 (approx baseline per family)  Psychiatric:        Mood and Affect: Mood normal.        Behavior:  Behavior normal.     ED Results / Procedures / Treatments   Labs (all labs ordered are listed, but only abnormal results are displayed) Labs Reviewed  COMPREHENSIVE METABOLIC PANEL - Abnormal; Notable for the following components:      Result Value   Glucose, Bld 100 (*)    Creatinine, Ser 1.34 (*)    Total Protein 6.0 (*)    GFR, Estimated 54 (*)    All other components within normal limits  CBC - Abnormal; Notable for the following components:   RBC 3.63 (*)    Hemoglobin 11.4 (*)    HCT 36.5 (*)    MCV 100.6 (*)    Platelets 117 (*)    All other components within normal limits  PROTIME-INR - Abnormal; Notable for the following components:   Prothrombin Time 21.7 (*)    INR 1.9 (*)    All other components within normal limits  I-STAT CHEM 8, ED - Abnormal; Notable for the following components:   Potassium 5.7 (*)    Creatinine, Ser 1.30 (*)    Hemoglobin 11.9 (*)    HCT 35.0 (*)    All other components within normal limits    EKG EKG Interpretation  Date/Time:  Thursday December 19 2021 14:06:32 EDT Ventricular Rate:  60 PR Interval:    QRS Duration: 109 QT Interval:  437 QTC Calculation: 437 R Axis:   77 Text Interpretation: Atrial fibrillation Borderline T abnormalities, inferior leads Interpretation limited secondary to artifact Confirmed by Wynona Dove ([id number]) on 12/20/2021 8:00:06 AM  Radiology  DG Chest Port 1 View  Result Date: 12/19/2021 CLINICAL DATA:  Trauma EXAM: PORTABLE CHEST 1 VIEW COMPARISON:  Chest x-ray dated November 24, 2021 FINDINGS: Unchanged cardiomegaly. Interval placement of loop recorder. Ill-defined left hemidiaphragm, likely due to atelectasis. Lungs otherwise clear. No large pleural effusion or evidence of pneumothorax. IMPRESSION: No acute abnormality. Electronically Signed   By: Yetta Glassman M.D.   On: 12/19/2021 14:32   DG Pelvis Portable  Result Date: 12/19/2021 CLINICAL DATA:  Fall. EXAM: PORTABLE PELVIS 1-2 VIEWS COMPARISON:   07/31/2016 FINDINGS: Frontal portable pelvic image demonstrates no visible fracture or diastasis. Right hip arthroplasty demonstrates normal alignment. No evidence of soft tissue foreign body. IMPRESSION: No acute findings. Right hip arthroplasty in normal alignment. Electronically Signed   By: Aletta Edouard M.D.   On: 12/19/2021 14:31    Procedures .Critical Care  Performed by: Jeanell Sparrow, DO Authorized by: Jeanell Sparrow, DO   Critical care provider statement:    Critical care time (minutes):  30   Critical care time was exclusive of:  Separately billable procedures and treating other patients   Critical care was necessary to treat or prevent imminent or life-threatening deterioration of the following conditions:  Trauma   Critical care was time spent personally by me on the following activities:  Development of treatment plan with patient or surrogate, discussions with consultants, evaluation of patient's response to treatment, examination of patient, ordering and review of laboratory studies, ordering and review of radiographic studies, ordering and performing treatments and interventions, pulse oximetry, re-evaluation of patient's condition, review of old charts and obtaining history from patient or surrogate     Medications Ordered in ED Medications  Tdap (BOOSTRIX) injection 0.5 mL (0.5 mLs Intramuscular Not Given 12/19/21 1420)  sodium chloride 0.9 % bolus 1,000 mL (1,000 mLs Intravenous New Bag/Given 12/19/21 1430)    ED Course/ Medical Decision Making/ A&P Clinical Course as of 12/20/21 0711  Thu Dec 19, 2021  1556 CT Head Wo Contrast 1. No CT evidence for acute intracranial abnormality. Atrophy and chronic small vessel ischemic changes of the white matter. Chronic right frontal and parietal infarcts 2. Multilevel degenerative changes throughout the cervical spine. No acute osseous abnormality   [HN]    Clinical Course User Index [HN] Audley Hose, MD                            Medical Decision Making Amount and/or Complexity of Data Reviewed Labs: ordered. Radiology: ordered.  Risk Prescription drug management.   This patient presents to the ED with chief complaint(s) of fall on thinners with pertinent past medical history of dementia which further complicates the presenting complaint. The complaint involves an extensive differential diagnosis and also carries with it a high risk of complications and morbidity.    Differential diagnoses for head trauma includes subdural hematoma, epidural hematoma, acute concussion, traumatic subarachnoid hemorrhage, cerebral contusions, etc.  . Serious etiologies were considered.   The initial plan is to screening labs and imaging  A Airway intact, trachea midline B Lungs clear bilateral, equal chest rise bilateral C Equal pulses in all 4 extremities, HDS, extremities warm well perfused D GCS 15, AO x2/baseline pupils 2 to 3 mm briskly reactive bilateral Hematoma left forehead, no active bleeding.   Additional history obtained: Additional history obtained from family and EMS  Records reviewed previous admission documents and prior labs and imaging, prior consultants including cardiology Of note patient  was admitted last month secondary to bradycardia, syncope with fall.  Slow A-fib.  Loop recorder was inserted, per cardiology PPM will be difficult given advanced dementia  Independent labs interpretation:  The following labs were independently interpreted:  Labs reviewed and are stable  Independent visualization of imaging: - I independently visualized the following imaging with scope of interpretation limited to determining acute life threatening conditions related to emergency care: chest X-ray, pelvis x-ray, CT head, CT cervical spine, which revealed XR unremarkable  Cardiac monitoring was reviewed and interpreted by myself which shows afib EKG with afib, rate 60's, no stemi, similar to prior    Treatment and Reassessment: Boostrix- pt did refuse > symptoms unchanged, he has no complaints   Consultation: - Consulted or discussed management/test interpretation w/ external professional: na  Consideration for admission or further workup: Admission was considered   Pt arrived as level 2 trauma w/ fall w/ head injury on blood thinners. Workup thus far is re-assurring, he is HDS, acting at baseline per family at bedside, appears to have been a mechanical fall. Pt is pending imaging results for dispo, signed out to incoming EDP pending imaging and re-assessment. He uses walker at baseline, family also has wheelchair to use at home as needed.   Social Determinants of health: Social History   Tobacco Use   Smoking status: Never   Smokeless tobacco: Former    Quit date: 07/23/1968  Vaping Use   Vaping Use: Never used  Substance Use Topics   Alcohol use: Yes    Alcohol/week: 1.0 standard drink of alcohol    Types: 1 Shots of liquor per week    Comment: occasional   Drug use: No            Final Clinical Impression(s) / ED Diagnoses Final diagnoses:  Fall, initial encounter  Minor head injury, initial encounter    Rx / DC Orders ED Discharge Orders     None         Jeanell Sparrow, DO 12/20/21 0801    Jeanell Sparrow, DO 12/20/21 3500

## 2021-12-19 NOTE — ED Notes (Signed)
Pt waiting for room to be cleaned and pt to be pulled out so this current pt could be moved in.

## 2021-12-19 NOTE — Discharge Instructions (Addendum)
It was a pleasure caring for you today in the emergency department. Please follow up with your primary care provider in 1-2 weeks. Please utilize your walker or wheelchair for help with getting around.  Please return to the emergency department for any worsening or worrisome symptoms.

## 2021-12-19 NOTE — ED Provider Notes (Signed)
Assumed care of patient from off-going team. For more details, please see note from same day.  In brief, this is a 76 y.o. male who presented with fall.   Plan/Dispo at time of sign-out & ED Course since sign-out: '[ ]'$ CT brain/c-spine  BP 139/77   Pulse 60   Temp (!) 97.3 F (36.3 C) (Oral)   Resp (!) 21   Ht '5\' 11"'$  (1.803 m)   Wt 81.6 kg   SpO2 96%   BMI 25.10 kg/m    ED Course:   Clinical Course as of 12/19/21 1556  Thu Dec 19, 2021  1556 CT Head Wo Contrast 1. No CT evidence for acute intracranial abnormality. Atrophy and chronic small vessel ischemic changes of the white matter. Chronic right frontal and parietal infarcts 2. Multilevel degenerative changes throughout the cervical spine. No acute osseous abnormality   [HN]    Clinical Course User Index [HN] Audley Hose, MD    Patient's imaging negative. Patient feels at baseline and will be discharged to home. Advised to utilize ambulatory aids such as walker or wheelchair. Patient refused tdap from offgoing provider. Advised to f/u with PCP in 1-2 weeks.  Dispo: DC w/ discharge instructions/return precautions ------------------------------- Cindee Lame, MD Emergency Medicine  This note was created using dictation software, which may contain spelling or grammatical errors.   Audley Hose, MD 12/19/21 1600

## 2021-12-19 NOTE — ED Notes (Signed)
Trauma start time 1351 (Trauma start would not allow me to chart)

## 2021-12-25 NOTE — Progress Notes (Signed)
Electrophysiology Office Note Date: 12/25/2021  ID:  [name] [name], DOB 03-05-1945, MRN [id number]  PCP: Deland Pretty, MD Primary Cardiologist: Quay Burow, MD Electrophysiologist: Virl Axe, MD   CC: ILR follow-up  [name] [name] is a 76 y.o. male seen today for Dr. Caryl Comes . he presents today for post hospital follow up.    Admitted 9/24 - 9/25 with collapse. He has chronic slow fib RVR with frequent pauses from 1-3.5 seconds, but neither were felt to be causative. Given his advanced dementia and unclear etiology, loop recorder was placed, as it was not felt PPM would improve his current clinical picture without clear correlation of his symptoms.   Since discharge from hospital the patient reports doing OK from a cardiac perspective. No further syncope. He has balance issues, and staff noted he was very right leaning while walking in with walker. Denies SOB or CP.   Device History: St. Jude loop recorder implanted 11/25/2021 for syncope  Past Medical History:  Diagnosis Date   Alzheimer disease ([place])    Anxiety    Arthritis    Atrial fibrillation ([place])    Chronic   Bipolar disorder (H[facility])    Chronic low back pain    Dementia (HCC)    Depression    Dizziness    Dysrhythmia    a fib   ED (erectile dysfunction)    External hemorrhoids    Fatigue    HLD (hyperlipidemia)    HTN (hypertension)    Hypogonadism in male    Lipoma of skin    Memory loss    Mixed Alzheimer's and vascular dementia (HCC)    Poor balance    Restless legs syndrome (RLS)    Sleep apnea    cpap   Stroke ([place]) 1991   Tremor    Past Surgical History:  Procedure Laterality Date   COLONOSCOPY     KNEE ARTHROPLASTY Left 06/04/2017   Procedure: LEFT TOTAL KNEE ARTHROPLASTY WITH COMPUTER NAVIGATION;  Surgeon: Rod Can, MD;  [facility];  Service: Orthopedics;  Laterality: Left;  NEEDS RNFA   LOOP RECORDER INSERTION N/A 11/25/2021   Procedure: LOOP RECORDER INSERTION;  Surgeon:  Deboraha Sprang, MD;  Location: [place] [facility];  Service: Cardiovascular;  Laterality: N/A;   TONSILLECTOMY AND ADENOIDECTOMY     TOTAL HIP ARTHROPLASTY Right 07/31/2016   Procedure: RIGHT TOTAL HIP ARTHROPLASTY ANTERIOR APPROACH;  Surgeon: Rod Can, MD;  [facility];  Service: Orthopedics;  Laterality: Right;  Requesting RNFA    Current Outpatient Medications  Medication Sig Dispense Refill   atorvastatin (LIPITOR) 40 MG tablet Take 40 mg by mouth every evening.     b complex vitamins tablet Take 1 tablet by mouth daily.     carbidopa-levodopa (SINEMET IR) 25-250 MG per tablet Take 1 tablet by mouth 2 (two) times daily.      divalproex (DEPAKOTE) 250 MG DR tablet Take 1 tablet by mouth every evening.     docusate sodium (COLACE) 100 MG capsule Take 1 capsule (100 mg total) by mouth 2 (two) times daily. 60 capsule 1   donepezil (ARICEPT) 10 MG tablet Take 1 tablet (10 mg total) by mouth at bedtime. 90 tablet 2   Evolocumab (REPATHA SURECLICK) 563 MG/ML SOAJ Inject 140 mg into the skin every 14 (fourteen) days.     fluocinonide gel (LIDEX) 1.49 % Apply 1 application topically 2 (two) times daily.     folic acid (FOLVITE) 1 MG tablet Take  1 mg by mouth at bedtime.      gabapentin (NEURONTIN) 300 MG capsule Take 1 capsule by mouth 2 (two) times daily.     lamoTRIgine (LAMICTAL) 150 MG tablet Take 150 mg by mouth 2 (two) times daily.     lisinopril (PRINIVIL,ZESTRIL) 10 MG tablet Take 10 mg by mouth every evening.     memantine (NAMENDA) 10 MG tablet Take 10 mg by mouth 2 (two) times daily.      metroNIDAZOLE (METROGEL) 0.75 % gel Apply 1 application topically 2 (two) times daily.     rivaroxaban (XARELTO) 20 MG TABS tablet Take 20 mg by mouth daily.     senna (SENOKOT) 8.6 MG TABS tablet Take 2 tablets (17.2 mg total) by mouth at bedtime. 60 each 3   No current facility-administered medications for this visit.    Allergies:   Latex   Social History: Social History    Socioeconomic History   Marital status: Married    Spouse name: Not on file   Number of children: 3   Years of education: Not on file   Highest education level: Not on file  Occupational History   Occupation: retired  Tobacco Use   Smoking status: Never   Smokeless tobacco: Former    Quit date: 07/23/1968  Vaping Use   Vaping Use: Never used  Substance and Sexual Activity   Alcohol use: Yes    Alcohol/week: 1.0 standard drink of alcohol    Types: 1 Shots of liquor per week    Comment: occasional   Drug use: No   Sexual activity: Yes  Other Topics Concern   Not on file  Social History Narrative   Not on file   Social Determinants of Health   Financial Resource Strain: Not on file  Food Insecurity: Not on file  Transportation Needs: Not on file  Physical Activity: Not on file  Stress: Not on file  Social Connections: Not on file  Intimate Partner Violence: Not on file    Family History: Family History  Problem Relation Age of Onset   Suicidality Unknown    Cancer Unknown    Alcohol abuse Unknown    Cancer Mother    Suicidality Father      Review of Systems: All other systems reviewed and are otherwise negative except as noted above.  Physical Exam: There were no vitals filed for this visit.   GEN- The patient is well appearing, alert and oriented x 3 today.   HEENT: normocephalic, atraumatic; sclera clear, conjunctiva pink; hearing intact; oropharynx clear; neck supple  Lungs- Clear to ausculation bilaterally, normal work of breathing.  No wheezes, rales, rhonchi Heart- Regular rate and rhythm, no murmurs, rubs or gallops  GI- soft, non-tender, non-distended, bowel sounds present  Extremities- no clubbing, cyanosis, or edema  MS- no significant deformity or atrophy Skin- warm and dry, no rash or lesion; ILR pocket well healed Psych- euthymic mood, full affect Neuro- strength and sensation are intact  PPM Interrogation- reviewed in detail today,  See  PACEART report  EKG:  EKG is not ordered today.  Recent Labs: 11/24/2021: Magnesium 1.9; TSH 1.759 12/19/2021: ALT <5; BUN 21; Creatinine, Ser 1.30; Hemoglobin 11.9; Platelets 117; Potassium 5.7; Sodium 140   Wt Readings from Last 3 Encounters:  12/19/21 180 lb (81.6 kg)  11/24/21 190 lb (86.2 kg)  09/20/21 215 lb (97.5 kg)     Other studies Reviewed: Additional studies/ records that were reviewed today include: Previous EP office notes,  Previous remote checks, Most recent labwork.   Assessment and Plan:  1. Syncope s/p St. Jude Loop recorder 2. ?Symptomatic bradycardia 3. Carotid sinus hypersensitivity Normal device function See Pace Art report No changes today With profound dementia and unclear etiology of his symptoms given longstanding bradycardia, loop recorder was implanted to not undergo pacing with post and peri-procedural risks given unclear benefit. Currently, he has pause, but not brady detection due to his chronic bradycardia. If he has symptoms not associated to an event, would consider turning on brady detection in the 30 or <40 bpm range.   4. Permanent AF Continue Xarelto for CHA2DS2/VASc of at least 5.  They have misplaced his monitor. Family verbalizes understanding we will NOT receive episodes until his monitor has been replaced. They are awaiting it in the mail.   Current medicines are reviewed at length with the patient today.    Disposition:   Follow up with Dr. Caryl Comes  in 3 Months    Signed, Annamaria Helling  12/25/2021 7:09 PM  [place] [id number] [phone] (office) [phone] (fax)

## 2021-12-26 ENCOUNTER — Encounter: Payer: Self-pay | Admitting: Student

## 2021-12-26 ENCOUNTER — Ambulatory Visit: Payer: Medicare PPO | Attending: Student | Admitting: Student

## 2021-12-26 VITALS — BP 116/72 | HR 65 | Ht 71.0 in | Wt 196.0 lb

## 2021-12-26 DIAGNOSIS — R001 Bradycardia, unspecified: Secondary | ICD-10-CM | POA: Diagnosis not present

## 2021-12-26 DIAGNOSIS — I482 Chronic atrial fibrillation, unspecified: Secondary | ICD-10-CM

## 2021-12-26 DIAGNOSIS — R55 Syncope and collapse: Secondary | ICD-10-CM

## 2021-12-26 LAB — CUP PACEART INCLINIC DEVICE CHECK
Date Time Interrogation Session: 20231026125043
Implantable Pulse Generator Implant Date: 20230925
Pulse Gen Serial Number: 511014105

## 2021-12-26 NOTE — Patient Instructions (Signed)
Medication Instructions:  Your physician recommends that you continue on your current medications as directed. Please refer to the Current Medication list given to you today.  *If you need a refill on your cardiac medications before your next appointment, please call your pharmacy*   Lab Work: None If you have labs (blood work) drawn today and your tests are completely normal, you will receive your results only by: [place] (if you have MyChart) OR A paper copy in the mail If you have any lab test that is abnormal or we need to change your treatment, we will call you to review the results.   Follow-Up: At [facility], you and your health needs are our priority.  As part of our continuing mission to provide you with exceptional heart care, we have created designated Provider Care Teams.  These Care Teams include your primary Cardiologist (physician) and Advanced Practice Providers (APPs -  Physician Assistants and Nurse Practitioners) who all work together to provide you with the care you need, when you need it.  We recommend signing up for the patient portal called "MyChart".  Sign up information is provided on this After Visit Summary.  MyChart is used to connect with patients for Virtual Visits (Telemedicine).  Patients are able to view lab/test results, encounter notes, upcoming appointments, etc.  Non-urgent messages can be sent to your provider as well.   To learn more about what you can do with MyChart, go to [email or web address].    Your next appointment:   3 month(s)  The format for your next appointment:   In Person  Provider:   Virl Axe, MD     Important Information About Sugar

## 2021-12-31 ENCOUNTER — Ambulatory Visit: Payer: Medicare PPO | Admitting: Cardiovascular Disease

## 2022-01-01 DIAGNOSIS — R7303 Prediabetes: Secondary | ICD-10-CM | POA: Diagnosis not present

## 2022-01-01 DIAGNOSIS — I10 Essential (primary) hypertension: Secondary | ICD-10-CM | POA: Diagnosis not present

## 2022-01-01 DIAGNOSIS — Z125 Encounter for screening for malignant neoplasm of prostate: Secondary | ICD-10-CM | POA: Diagnosis not present

## 2022-01-08 DIAGNOSIS — M6281 Muscle weakness (generalized): Secondary | ICD-10-CM | POA: Diagnosis not present

## 2022-01-08 DIAGNOSIS — R2689 Other abnormalities of gait and mobility: Secondary | ICD-10-CM | POA: Diagnosis not present

## 2022-01-08 DIAGNOSIS — M25561 Pain in right knee: Secondary | ICD-10-CM | POA: Diagnosis not present

## 2022-01-10 DIAGNOSIS — M6281 Muscle weakness (generalized): Secondary | ICD-10-CM | POA: Diagnosis not present

## 2022-01-10 DIAGNOSIS — M25561 Pain in right knee: Secondary | ICD-10-CM | POA: Diagnosis not present

## 2022-01-10 DIAGNOSIS — R2689 Other abnormalities of gait and mobility: Secondary | ICD-10-CM | POA: Diagnosis not present

## 2022-01-13 DIAGNOSIS — M6281 Muscle weakness (generalized): Secondary | ICD-10-CM | POA: Diagnosis not present

## 2022-01-13 DIAGNOSIS — M25561 Pain in right knee: Secondary | ICD-10-CM | POA: Diagnosis not present

## 2022-01-13 DIAGNOSIS — R2689 Other abnormalities of gait and mobility: Secondary | ICD-10-CM | POA: Diagnosis not present

## 2022-01-15 DIAGNOSIS — R2689 Other abnormalities of gait and mobility: Secondary | ICD-10-CM | POA: Diagnosis not present

## 2022-01-15 DIAGNOSIS — M6281 Muscle weakness (generalized): Secondary | ICD-10-CM | POA: Diagnosis not present

## 2022-01-15 DIAGNOSIS — M25561 Pain in right knee: Secondary | ICD-10-CM | POA: Diagnosis not present

## 2022-01-16 ENCOUNTER — Encounter: Payer: Self-pay | Admitting: Cardiovascular Disease

## 2022-01-16 ENCOUNTER — Ambulatory Visit: Payer: Medicare PPO | Attending: Cardiovascular Disease | Admitting: Cardiovascular Disease

## 2022-01-16 VITALS — BP 104/82 | HR 64 | Ht 71.0 in | Wt 194.8 lb

## 2022-01-16 DIAGNOSIS — E782 Mixed hyperlipidemia: Secondary | ICD-10-CM

## 2022-01-16 DIAGNOSIS — I482 Chronic atrial fibrillation, unspecified: Secondary | ICD-10-CM | POA: Diagnosis not present

## 2022-01-16 DIAGNOSIS — I10 Essential (primary) hypertension: Secondary | ICD-10-CM

## 2022-01-16 DIAGNOSIS — R001 Bradycardia, unspecified: Secondary | ICD-10-CM

## 2022-01-16 NOTE — Assessment & Plan Note (Signed)
[name] [name] well apparently had a syncopal episode and was hospitalized back in September.  He was seen by Dr. Caryl Comes who placed a loop recorder.  Given his age and dementia he did not feel that placing a permanent transvenous pacemaker first without definitive evidence of an arrhythmogenic cause was appropriate.  He has not had any further episodes.  When EMS was brought in initially called his heart rate was in the 30s.  He did have several long pauses.  He is not on any negative chronotropic medications.

## 2022-01-16 NOTE — Assessment & Plan Note (Signed)
History of chronic A-fib rate controlled on Xarelto oral anticoagulation.  He does have dementia and is a fall risk.  At this point, we will continue his anticoagulation.

## 2022-01-16 NOTE — Progress Notes (Signed)
01/16/2022 [name] [name]   30-Jun-1945  [id number]  Primary Physician Deland Pretty, MD Primary Cardiologist: Lorretta Harp MD [name], [name]  HPI:  [name] [name] is a 76 y.o.  married Caucasian male father of 34, grandfather and one grandchild is accompanied by his wife [name] [name] who is also a patient of mine.   I last saw him in the office 12/01/2019.  He has a history of hypertension and hyperlipidemia as well as chronic atrial fibrillation. I initially saw him back in 1991 when he had a stroke and placed him on Coumadin. He was recently transitioned to Xarelto. He has a history of dementia over the last several years as well as bipolar disorder.  His dementia has apparently progressed over the years.  He has never had a heart attack. He denies chest pain or shortness of breath. He had an uncomplicated total hip replacement recently and left total knee replacement after I saw him.   Since I saw him 2 in the office 2 years ago he has had progressive dementia.  He was hospitalized in September for syncope.  He was found to be bradycardic with long pauses.  He was evaluated by Dr. Caryl Comes who elected to place a loop recorder rather than a permanent transvenous pacemaker in order to determine arrhythmogenic etiology.  He denies chest pain or shortness of breath.  Current Meds  Medication Sig   atorvastatin (LIPITOR) 40 MG tablet Take 40 mg by mouth every evening.   b complex vitamins tablet Take 1 tablet by mouth daily.   carbidopa-levodopa (SINEMET IR) 25-250 MG per tablet Take 1 tablet by mouth 2 (two) times daily.    divalproex (DEPAKOTE) 250 MG DR tablet Take 1 tablet by mouth every evening.   docusate sodium (COLACE) 100 MG capsule Take 1 capsule (100 mg total) by mouth 2 (two) times daily.   donepezil (ARICEPT) 10 MG tablet Take 1 tablet (10 mg total) by mouth at bedtime.   Evolocumab (REPATHA SURECLICK) 008 MG/ML SOAJ Inject 140 mg into the skin every 14 (fourteen)  days.   folic acid (FOLVITE) 1 MG tablet Take 1 mg by mouth at bedtime.    gabapentin (NEURONTIN) 300 MG capsule Take 1 capsule by mouth 2 (two) times daily.   lamoTRIgine (LAMICTAL) 150 MG tablet Take 150 mg by mouth 2 (two) times daily.   lisinopril (PRINIVIL,ZESTRIL) 10 MG tablet Take 10 mg by mouth every evening.   memantine (NAMENDA) 10 MG tablet Take 10 mg by mouth 2 (two) times daily.    metroNIDAZOLE (METROGEL) 0.75 % gel Apply 1 application topically 2 (two) times daily.   rivaroxaban (XARELTO) 20 MG TABS tablet Take 20 mg by mouth daily.     Allergies  Allergen Reactions   Latex Rash and Other (See Comments)    Reaction to knee wraps    Social History   Socioeconomic History   Marital status: Married    Spouse name: Not on file   Number of children: 3   Years of education: Not on file   Highest education level: Not on file  Occupational History   Occupation: retired  Tobacco Use   Smoking status: Never   Smokeless tobacco: Former    Quit date: 07/23/1968  Vaping Use   Vaping Use: Never used  Substance and Sexual Activity   Alcohol use: Yes    Alcohol/week: 1.0 standard drink of alcohol    Types: 1 Shots of liquor per  week    Comment: occasional   Drug use: No   Sexual activity: Yes  Other Topics Concern   Not on file  Social History Narrative   Not on file   Social Determinants of Health   Financial Resource Strain: Not on file  Food Insecurity: Not on file  Transportation Needs: Not on file  Physical Activity: Not on file  Stress: Not on file  Social Connections: Not on file  Intimate Partner Violence: Not on file     Review of Systems: General: negative for chills, fever, night sweats or weight changes.  Cardiovascular: negative for chest pain, dyspnea on exertion, edema, orthopnea, palpitations, paroxysmal nocturnal dyspnea or shortness of breath Dermatological: negative for rash Respiratory: negative for cough or wheezing Urologic: negative for  hematuria Abdominal: negative for nausea, vomiting, diarrhea, bright red blood per rectum, melena, or hematemesis Neurologic: negative for visual changes, syncope, or dizziness All other systems reviewed and are otherwise negative except as noted above.    Blood pressure 104/82, pulse 64, height '5\' 11"'$  (1.803 m), weight 194 lb 12.8 oz (88.4 kg), SpO2 97 %.  General appearance: alert and no distress Neck: no adenopathy, no carotid bruit, no JVD, supple, symmetrical, trachea midline, and thyroid not enlarged, symmetric, no tenderness/mass/nodules Lungs: clear to auscultation bilaterally Heart: irregularly irregular rhythm Extremities: extremities normal, atraumatic, no cyanosis or edema Pulses: 2+ and symmetric Skin: Skin color, texture, turgor normal. No rashes or lesions Neurologic: Grossly normal  EKG not performed today  ASSESSMENT AND PLAN:   Hyperlipidemia History of hyperlipidemia on Repatha with a recent lipid profile performed 12/18/2020 revealing total cholesterol 74, LDL of 1 and HDL of 60.  Chronic atrial fibrillation (HCC) History of chronic A-fib rate controlled on Xarelto oral anticoagulation.  He does have dementia and is a fall risk.  At this point, we will continue his anticoagulation.  Essential hypertension History of essential hypertension with pressure measured today at 104/82.  He is not on antihypertensive medications.  Bradycardia Mr. Lissa Hoard well apparently had a syncopal episode and was hospitalized back in September.  He was seen by Dr. Caryl Comes who placed a loop recorder.  Given his age and dementia he did not feel that placing a permanent transvenous pacemaker first without definitive evidence of an arrhythmogenic cause was appropriate.  He has not had any further episodes.  When EMS was brought in initially called his heart rate was in the 30s.  He did have several long pauses.  He is not on any negative chronotropic medications.     Lorretta Harp MD  FACP,FACC,FAHA, [facility] 01/16/2022 3:30 PM

## 2022-01-16 NOTE — Assessment & Plan Note (Signed)
History of hyperlipidemia on Repatha with a recent lipid profile performed 12/18/2020 revealing total cholesterol 74, LDL of 1 and HDL of 60.

## 2022-01-16 NOTE — Assessment & Plan Note (Signed)
History of essential hypertension with pressure measured today at 104/82.  He is not on antihypertensive medications.

## 2022-01-16 NOTE — Patient Instructions (Signed)
Medication Instructions:  Your physician recommends that you continue on your current medications as directed. Please refer to the Current Medication list given to you today.  *If you need a refill on your cardiac medications before your next appointment, please call your pharmacy*   Follow-Up: At [facility], you and your health needs are our priority.  As part of our continuing mission to provide you with exceptional heart care, we have created designated Provider Care Teams.  These Care Teams include your primary Cardiologist (physician) and Advanced Practice Providers (APPs -  Physician Assistants and Nurse Practitioners) who all work together to provide you with the care you need, when you need it.  We recommend signing up for the patient portal called "MyChart".  Sign up information is provided on this After Visit Summary.  MyChart is used to connect with patients for Virtual Visits (Telemedicine).  Patients are able to view lab/test results, encounter notes, upcoming appointments, etc.  Non-urgent messages can be sent to your provider as well.   To learn more about what you can do with MyChart, go to [email or web address].    Your next appointment:   12 month(s)  The format for your next appointment:   In Person  Provider:   [name] Berry, MD   

## 2022-01-20 DIAGNOSIS — M25561 Pain in right knee: Secondary | ICD-10-CM | POA: Diagnosis not present

## 2022-01-20 DIAGNOSIS — R2689 Other abnormalities of gait and mobility: Secondary | ICD-10-CM | POA: Diagnosis not present

## 2022-01-20 DIAGNOSIS — M6281 Muscle weakness (generalized): Secondary | ICD-10-CM | POA: Diagnosis not present

## 2022-01-30 DIAGNOSIS — G309 Alzheimer's disease, unspecified: Secondary | ICD-10-CM | POA: Diagnosis not present

## 2022-01-30 DIAGNOSIS — I4891 Unspecified atrial fibrillation: Secondary | ICD-10-CM | POA: Diagnosis not present

## 2022-01-30 DIAGNOSIS — I10 Essential (primary) hypertension: Secondary | ICD-10-CM | POA: Diagnosis not present

## 2022-02-07 DIAGNOSIS — M25561 Pain in right knee: Secondary | ICD-10-CM | POA: Diagnosis not present

## 2022-02-07 DIAGNOSIS — M6281 Muscle weakness (generalized): Secondary | ICD-10-CM | POA: Diagnosis not present

## 2022-02-07 DIAGNOSIS — R2689 Other abnormalities of gait and mobility: Secondary | ICD-10-CM | POA: Diagnosis not present

## 2022-02-10 DIAGNOSIS — M25561 Pain in right knee: Secondary | ICD-10-CM | POA: Diagnosis not present

## 2022-02-10 DIAGNOSIS — M6281 Muscle weakness (generalized): Secondary | ICD-10-CM | POA: Diagnosis not present

## 2022-02-10 DIAGNOSIS — R2689 Other abnormalities of gait and mobility: Secondary | ICD-10-CM | POA: Diagnosis not present

## 2022-02-13 DIAGNOSIS — M6281 Muscle weakness (generalized): Secondary | ICD-10-CM | POA: Diagnosis not present

## 2022-02-13 DIAGNOSIS — M25561 Pain in right knee: Secondary | ICD-10-CM | POA: Diagnosis not present

## 2022-02-13 DIAGNOSIS — R2689 Other abnormalities of gait and mobility: Secondary | ICD-10-CM | POA: Diagnosis not present

## 2022-02-17 DIAGNOSIS — M6281 Muscle weakness (generalized): Secondary | ICD-10-CM | POA: Diagnosis not present

## 2022-02-17 DIAGNOSIS — R2689 Other abnormalities of gait and mobility: Secondary | ICD-10-CM | POA: Diagnosis not present

## 2022-02-17 DIAGNOSIS — M25561 Pain in right knee: Secondary | ICD-10-CM | POA: Diagnosis not present

## 2022-02-19 DIAGNOSIS — M25561 Pain in right knee: Secondary | ICD-10-CM | POA: Diagnosis not present

## 2022-02-19 DIAGNOSIS — R2689 Other abnormalities of gait and mobility: Secondary | ICD-10-CM | POA: Diagnosis not present

## 2022-02-19 DIAGNOSIS — M6281 Muscle weakness (generalized): Secondary | ICD-10-CM | POA: Diagnosis not present

## 2022-03-05 DIAGNOSIS — M6281 Muscle weakness (generalized): Secondary | ICD-10-CM | POA: Diagnosis not present

## 2022-03-05 DIAGNOSIS — R2689 Other abnormalities of gait and mobility: Secondary | ICD-10-CM | POA: Diagnosis not present

## 2022-03-05 DIAGNOSIS — M25561 Pain in right knee: Secondary | ICD-10-CM | POA: Diagnosis not present

## 2022-03-07 DIAGNOSIS — R2689 Other abnormalities of gait and mobility: Secondary | ICD-10-CM | POA: Diagnosis not present

## 2022-03-07 DIAGNOSIS — M25561 Pain in right knee: Secondary | ICD-10-CM | POA: Diagnosis not present

## 2022-03-07 DIAGNOSIS — M6281 Muscle weakness (generalized): Secondary | ICD-10-CM | POA: Diagnosis not present

## 2022-03-10 DIAGNOSIS — M25561 Pain in right knee: Secondary | ICD-10-CM | POA: Diagnosis not present

## 2022-03-10 DIAGNOSIS — M6281 Muscle weakness (generalized): Secondary | ICD-10-CM | POA: Diagnosis not present

## 2022-03-10 DIAGNOSIS — R2689 Other abnormalities of gait and mobility: Secondary | ICD-10-CM | POA: Diagnosis not present

## 2022-03-12 ENCOUNTER — Ambulatory Visit: Payer: Medicare PPO | Admitting: Cardiovascular Disease

## 2022-03-12 DIAGNOSIS — M6281 Muscle weakness (generalized): Secondary | ICD-10-CM | POA: Diagnosis not present

## 2022-03-12 DIAGNOSIS — R2689 Other abnormalities of gait and mobility: Secondary | ICD-10-CM | POA: Diagnosis not present

## 2022-03-12 DIAGNOSIS — M25561 Pain in right knee: Secondary | ICD-10-CM | POA: Diagnosis not present

## 2022-03-14 DIAGNOSIS — M6281 Muscle weakness (generalized): Secondary | ICD-10-CM | POA: Diagnosis not present

## 2022-03-14 DIAGNOSIS — R2689 Other abnormalities of gait and mobility: Secondary | ICD-10-CM | POA: Diagnosis not present

## 2022-03-14 DIAGNOSIS — M25561 Pain in right knee: Secondary | ICD-10-CM | POA: Diagnosis not present

## 2022-03-17 DIAGNOSIS — M25561 Pain in right knee: Secondary | ICD-10-CM | POA: Diagnosis not present

## 2022-03-17 DIAGNOSIS — R2689 Other abnormalities of gait and mobility: Secondary | ICD-10-CM | POA: Diagnosis not present

## 2022-03-17 DIAGNOSIS — M6281 Muscle weakness (generalized): Secondary | ICD-10-CM | POA: Diagnosis not present

## 2022-03-18 DIAGNOSIS — M1711 Unilateral primary osteoarthritis, right knee: Secondary | ICD-10-CM | POA: Diagnosis not present

## 2022-03-20 ENCOUNTER — Telehealth: Payer: Self-pay | Admitting: *Deleted

## 2022-03-20 NOTE — Telephone Encounter (Signed)
   Pre-operative Risk Assessment    Patient Name: [name] [name]  DOB: 1945-04-16 MRN: [id number]      Request for Surgical Clearance    Procedure:   RIGHT TOTAL KNEE ARTHROPLASTY  Date of Surgery:  Clearance TBD                                 Surgeon:  DR. Rod Can Surgeon's Group or Practice Name:  [facility] Phone number:  [phone] Fax number:  [phone]   Type of Clearance Requested:   - Pharmacy:  Hold Rivaroxaban (Xarelto) NOT INDICATED HOW LONG   Type of Anesthesia:  Spinal   Additional requests/questions:    [name]   03/20/2022, 12:10 PM

## 2022-03-21 NOTE — Telephone Encounter (Signed)
Patient with diagnosis of afib on Xarelto for anticoagulation.    Procedure: RIGHT TOTAL KNEE ARTHROPLASTY  Date of procedure: TBD   CHA2DS2-VASc Score = 6   This indicates a 9.7% annual risk of stroke. The patient's score is based upon: CHF History: 0 HTN History: 1 Diabetes History: 0 Stroke History: 2 Vascular Disease History: 1 Age Score: 2 Gender Score: 0      CrCl 58 ml/min  Patient will need to hold Xarelto for 3 days prior to procedure due to spinal anesthesia. He has a hx of stroke. He did have his other knee replaced previously, but I cannot find that clearance note. Will confirm with Dr. Gwenlyn Found he is ok with patient holding Xarelto 3 days prior.  **This guidance is not considered finalized until pre-operative APP has relayed final recommendations.**

## 2022-03-24 NOTE — Telephone Encounter (Signed)
   Patient Name: [name] [name]  DOB: [birth year] MRN: [id number]  Primary Cardiologist: Quay Burow, MD  Clinical pharmacists have reviewed the patient's past medical history, labs, and current medications as part of preoperative protocol coverage. The following recommendations have been made:  Patient with diagnosis of afib on Xarelto for anticoagulation.     Procedure: RIGHT TOTAL KNEE ARTHROPLASTY  Date of procedure: TBD     CHA2DS2-VASc Score = 6  This indicates a 9.7% annual risk of stroke. The patient's score is based upon: CHF History: 0 HTN History: 1 Diabetes History: 0 Stroke History: 2 Vascular Disease History: 1 Age Score: 2 Gender Score: 0       CrCl 58 ml/min   Per office protocol, pt may hold Xarelto for 3 days prior to surgery. Please resume Xarelto as soon as possible postprocedure, at the discretion of the surgeon.   I will route this recommendation to the requesting party via Epic fax function and remove from pre-op pool.  Please call with questions.  Lenna Sciara, NP 03/24/2022, 12:02 PM

## 2022-03-25 DIAGNOSIS — G2581 Restless legs syndrome: Secondary | ICD-10-CM | POA: Diagnosis not present

## 2022-03-25 DIAGNOSIS — R262 Difficulty in walking, not elsewhere classified: Secondary | ICD-10-CM | POA: Diagnosis not present

## 2022-03-25 DIAGNOSIS — R5383 Other fatigue: Secondary | ICD-10-CM | POA: Diagnosis not present

## 2022-03-25 DIAGNOSIS — F39 Unspecified mood [affective] disorder: Secondary | ICD-10-CM | POA: Diagnosis not present

## 2022-03-25 DIAGNOSIS — G20A1 Parkinson's disease without dyskinesia, without mention of fluctuations: Secondary | ICD-10-CM | POA: Diagnosis not present

## 2022-03-25 DIAGNOSIS — H532 Diplopia: Secondary | ICD-10-CM | POA: Diagnosis not present

## 2022-03-25 DIAGNOSIS — R413 Other amnesia: Secondary | ICD-10-CM | POA: Diagnosis not present

## 2022-03-26 ENCOUNTER — Telehealth: Payer: Self-pay

## 2022-03-26 NOTE — Telephone Encounter (Signed)
I spoke with the patient wife with [employer] tech support on the phone. She was not at home to trouble shoot the monitor. Jason from [employer] support will give them a call back tomorrow at 2 pm.

## 2022-03-26 NOTE — Telephone Encounter (Signed)
Pt wife called in wanting help with monitor. I insisted we call tech support together because it looks like he should have a phone with the app but she says they have a monitor not a phone. Pt wife was unable to wait for me to call tech support and insists I call her tomorrow afternoon

## 2022-03-28 DIAGNOSIS — M25561 Pain in right knee: Secondary | ICD-10-CM | POA: Diagnosis not present

## 2022-03-28 DIAGNOSIS — R2689 Other abnormalities of gait and mobility: Secondary | ICD-10-CM | POA: Diagnosis not present

## 2022-03-28 DIAGNOSIS — M6281 Muscle weakness (generalized): Secondary | ICD-10-CM | POA: Diagnosis not present

## 2022-03-28 NOTE — Telephone Encounter (Signed)
Monitor did update 03/28/2022

## 2022-03-29 DIAGNOSIS — E785 Hyperlipidemia, unspecified: Secondary | ICD-10-CM | POA: Diagnosis not present

## 2022-03-29 DIAGNOSIS — F0283 Dementia in other diseases classified elsewhere, unspecified severity, with mood disturbance: Secondary | ICD-10-CM | POA: Diagnosis not present

## 2022-03-29 DIAGNOSIS — I4891 Unspecified atrial fibrillation: Secondary | ICD-10-CM | POA: Diagnosis not present

## 2022-03-29 DIAGNOSIS — G309 Alzheimer's disease, unspecified: Secondary | ICD-10-CM | POA: Diagnosis not present

## 2022-03-29 DIAGNOSIS — F319 Bipolar disorder, unspecified: Secondary | ICD-10-CM | POA: Diagnosis not present

## 2022-03-29 DIAGNOSIS — G2581 Restless legs syndrome: Secondary | ICD-10-CM | POA: Diagnosis not present

## 2022-03-29 DIAGNOSIS — I69351 Hemiplegia and hemiparesis following cerebral infarction affecting right dominant side: Secondary | ICD-10-CM | POA: Diagnosis not present

## 2022-03-29 DIAGNOSIS — F0153 Vascular dementia, unspecified severity, with mood disturbance: Secondary | ICD-10-CM | POA: Diagnosis not present

## 2022-03-29 DIAGNOSIS — G20A1 Parkinson's disease without dyskinesia, without mention of fluctuations: Secondary | ICD-10-CM | POA: Diagnosis not present

## 2022-04-02 DIAGNOSIS — G2581 Restless legs syndrome: Secondary | ICD-10-CM | POA: Diagnosis not present

## 2022-04-02 DIAGNOSIS — G309 Alzheimer's disease, unspecified: Secondary | ICD-10-CM | POA: Diagnosis not present

## 2022-04-02 DIAGNOSIS — G20A1 Parkinson's disease without dyskinesia, without mention of fluctuations: Secondary | ICD-10-CM | POA: Diagnosis not present

## 2022-04-02 DIAGNOSIS — I69351 Hemiplegia and hemiparesis following cerebral infarction affecting right dominant side: Secondary | ICD-10-CM | POA: Diagnosis not present

## 2022-04-02 DIAGNOSIS — I4891 Unspecified atrial fibrillation: Secondary | ICD-10-CM | POA: Diagnosis not present

## 2022-04-02 DIAGNOSIS — F319 Bipolar disorder, unspecified: Secondary | ICD-10-CM | POA: Diagnosis not present

## 2022-04-02 DIAGNOSIS — F0153 Vascular dementia, unspecified severity, with mood disturbance: Secondary | ICD-10-CM | POA: Diagnosis not present

## 2022-04-02 DIAGNOSIS — F0283 Dementia in other diseases classified elsewhere, unspecified severity, with mood disturbance: Secondary | ICD-10-CM | POA: Diagnosis not present

## 2022-04-02 DIAGNOSIS — E785 Hyperlipidemia, unspecified: Secondary | ICD-10-CM | POA: Diagnosis not present

## 2022-04-03 DIAGNOSIS — F319 Bipolar disorder, unspecified: Secondary | ICD-10-CM | POA: Diagnosis not present

## 2022-04-03 DIAGNOSIS — I69351 Hemiplegia and hemiparesis following cerebral infarction affecting right dominant side: Secondary | ICD-10-CM | POA: Diagnosis not present

## 2022-04-03 DIAGNOSIS — F0153 Vascular dementia, unspecified severity, with mood disturbance: Secondary | ICD-10-CM | POA: Diagnosis not present

## 2022-04-03 DIAGNOSIS — G309 Alzheimer's disease, unspecified: Secondary | ICD-10-CM | POA: Diagnosis not present

## 2022-04-03 DIAGNOSIS — E785 Hyperlipidemia, unspecified: Secondary | ICD-10-CM | POA: Diagnosis not present

## 2022-04-03 DIAGNOSIS — F0283 Dementia in other diseases classified elsewhere, unspecified severity, with mood disturbance: Secondary | ICD-10-CM | POA: Diagnosis not present

## 2022-04-03 DIAGNOSIS — I4891 Unspecified atrial fibrillation: Secondary | ICD-10-CM | POA: Diagnosis not present

## 2022-04-03 DIAGNOSIS — G2581 Restless legs syndrome: Secondary | ICD-10-CM | POA: Diagnosis not present

## 2022-04-03 DIAGNOSIS — G20A1 Parkinson's disease without dyskinesia, without mention of fluctuations: Secondary | ICD-10-CM | POA: Diagnosis not present

## 2022-04-04 DIAGNOSIS — F0153 Vascular dementia, unspecified severity, with mood disturbance: Secondary | ICD-10-CM | POA: Diagnosis not present

## 2022-04-04 DIAGNOSIS — E785 Hyperlipidemia, unspecified: Secondary | ICD-10-CM | POA: Diagnosis not present

## 2022-04-04 DIAGNOSIS — I4891 Unspecified atrial fibrillation: Secondary | ICD-10-CM | POA: Diagnosis not present

## 2022-04-04 DIAGNOSIS — G309 Alzheimer's disease, unspecified: Secondary | ICD-10-CM | POA: Diagnosis not present

## 2022-04-04 DIAGNOSIS — G20A1 Parkinson's disease without dyskinesia, without mention of fluctuations: Secondary | ICD-10-CM | POA: Diagnosis not present

## 2022-04-04 DIAGNOSIS — F0283 Dementia in other diseases classified elsewhere, unspecified severity, with mood disturbance: Secondary | ICD-10-CM | POA: Diagnosis not present

## 2022-04-04 DIAGNOSIS — I69351 Hemiplegia and hemiparesis following cerebral infarction affecting right dominant side: Secondary | ICD-10-CM | POA: Diagnosis not present

## 2022-04-04 DIAGNOSIS — F319 Bipolar disorder, unspecified: Secondary | ICD-10-CM | POA: Diagnosis not present

## 2022-04-04 DIAGNOSIS — G2581 Restless legs syndrome: Secondary | ICD-10-CM | POA: Diagnosis not present

## 2022-04-07 ENCOUNTER — Ambulatory Visit: Payer: Medicare PPO

## 2022-04-07 DIAGNOSIS — I482 Chronic atrial fibrillation, unspecified: Secondary | ICD-10-CM

## 2022-04-07 DIAGNOSIS — F0153 Vascular dementia, unspecified severity, with mood disturbance: Secondary | ICD-10-CM | POA: Diagnosis not present

## 2022-04-07 DIAGNOSIS — I4891 Unspecified atrial fibrillation: Secondary | ICD-10-CM | POA: Diagnosis not present

## 2022-04-07 DIAGNOSIS — E785 Hyperlipidemia, unspecified: Secondary | ICD-10-CM | POA: Diagnosis not present

## 2022-04-07 DIAGNOSIS — F0283 Dementia in other diseases classified elsewhere, unspecified severity, with mood disturbance: Secondary | ICD-10-CM | POA: Diagnosis not present

## 2022-04-07 DIAGNOSIS — F319 Bipolar disorder, unspecified: Secondary | ICD-10-CM | POA: Diagnosis not present

## 2022-04-07 DIAGNOSIS — G20A1 Parkinson's disease without dyskinesia, without mention of fluctuations: Secondary | ICD-10-CM | POA: Diagnosis not present

## 2022-04-07 DIAGNOSIS — G2581 Restless legs syndrome: Secondary | ICD-10-CM | POA: Diagnosis not present

## 2022-04-07 DIAGNOSIS — I69351 Hemiplegia and hemiparesis following cerebral infarction affecting right dominant side: Secondary | ICD-10-CM | POA: Diagnosis not present

## 2022-04-07 DIAGNOSIS — G309 Alzheimer's disease, unspecified: Secondary | ICD-10-CM | POA: Diagnosis not present

## 2022-04-08 LAB — CUP PACEART REMOTE DEVICE CHECK
Date Time Interrogation Session: 20240204020857
Implantable Pulse Generator Implant Date: 20230925
Pulse Gen Serial Number: 511014105

## 2022-04-10 DIAGNOSIS — G20A1 Parkinson's disease without dyskinesia, without mention of fluctuations: Secondary | ICD-10-CM | POA: Diagnosis not present

## 2022-04-10 DIAGNOSIS — G309 Alzheimer's disease, unspecified: Secondary | ICD-10-CM | POA: Diagnosis not present

## 2022-04-10 DIAGNOSIS — F319 Bipolar disorder, unspecified: Secondary | ICD-10-CM | POA: Diagnosis not present

## 2022-04-10 DIAGNOSIS — E785 Hyperlipidemia, unspecified: Secondary | ICD-10-CM | POA: Diagnosis not present

## 2022-04-10 DIAGNOSIS — I69351 Hemiplegia and hemiparesis following cerebral infarction affecting right dominant side: Secondary | ICD-10-CM | POA: Diagnosis not present

## 2022-04-10 DIAGNOSIS — F0153 Vascular dementia, unspecified severity, with mood disturbance: Secondary | ICD-10-CM | POA: Diagnosis not present

## 2022-04-10 DIAGNOSIS — I4891 Unspecified atrial fibrillation: Secondary | ICD-10-CM | POA: Diagnosis not present

## 2022-04-10 DIAGNOSIS — G2581 Restless legs syndrome: Secondary | ICD-10-CM | POA: Diagnosis not present

## 2022-04-10 DIAGNOSIS — F0283 Dementia in other diseases classified elsewhere, unspecified severity, with mood disturbance: Secondary | ICD-10-CM | POA: Diagnosis not present

## 2022-04-15 ENCOUNTER — Ambulatory Visit: Payer: Medicare PPO | Attending: Internal Medicine | Admitting: Internal Medicine

## 2022-04-15 DIAGNOSIS — G2581 Restless legs syndrome: Secondary | ICD-10-CM | POA: Diagnosis not present

## 2022-04-15 DIAGNOSIS — F0283 Dementia in other diseases classified elsewhere, unspecified severity, with mood disturbance: Secondary | ICD-10-CM | POA: Diagnosis not present

## 2022-04-15 DIAGNOSIS — Z01818 Encounter for other preprocedural examination: Secondary | ICD-10-CM | POA: Diagnosis not present

## 2022-04-15 DIAGNOSIS — I4891 Unspecified atrial fibrillation: Secondary | ICD-10-CM | POA: Diagnosis not present

## 2022-04-15 DIAGNOSIS — F0153 Vascular dementia, unspecified severity, with mood disturbance: Secondary | ICD-10-CM | POA: Diagnosis not present

## 2022-04-15 DIAGNOSIS — R7303 Prediabetes: Secondary | ICD-10-CM | POA: Diagnosis not present

## 2022-04-15 DIAGNOSIS — I10 Essential (primary) hypertension: Secondary | ICD-10-CM | POA: Diagnosis not present

## 2022-04-15 DIAGNOSIS — G309 Alzheimer's disease, unspecified: Secondary | ICD-10-CM | POA: Diagnosis not present

## 2022-04-15 DIAGNOSIS — E785 Hyperlipidemia, unspecified: Secondary | ICD-10-CM | POA: Diagnosis not present

## 2022-04-15 DIAGNOSIS — Z9889 Other specified postprocedural states: Secondary | ICD-10-CM | POA: Insufficient documentation

## 2022-04-15 DIAGNOSIS — I69351 Hemiplegia and hemiparesis following cerebral infarction affecting right dominant side: Secondary | ICD-10-CM | POA: Diagnosis not present

## 2022-04-15 DIAGNOSIS — G20A1 Parkinson's disease without dyskinesia, without mention of fluctuations: Secondary | ICD-10-CM | POA: Diagnosis not present

## 2022-04-15 DIAGNOSIS — F319 Bipolar disorder, unspecified: Secondary | ICD-10-CM | POA: Diagnosis not present

## 2022-04-16 DIAGNOSIS — I4891 Unspecified atrial fibrillation: Secondary | ICD-10-CM | POA: Diagnosis not present

## 2022-04-16 DIAGNOSIS — F0153 Vascular dementia, unspecified severity, with mood disturbance: Secondary | ICD-10-CM | POA: Diagnosis not present

## 2022-04-16 DIAGNOSIS — G309 Alzheimer's disease, unspecified: Secondary | ICD-10-CM | POA: Diagnosis not present

## 2022-04-16 DIAGNOSIS — F319 Bipolar disorder, unspecified: Secondary | ICD-10-CM | POA: Diagnosis not present

## 2022-04-16 DIAGNOSIS — G20A1 Parkinson's disease without dyskinesia, without mention of fluctuations: Secondary | ICD-10-CM | POA: Diagnosis not present

## 2022-04-16 DIAGNOSIS — E785 Hyperlipidemia, unspecified: Secondary | ICD-10-CM | POA: Diagnosis not present

## 2022-04-16 DIAGNOSIS — G2581 Restless legs syndrome: Secondary | ICD-10-CM | POA: Diagnosis not present

## 2022-04-16 DIAGNOSIS — I69351 Hemiplegia and hemiparesis following cerebral infarction affecting right dominant side: Secondary | ICD-10-CM | POA: Diagnosis not present

## 2022-04-16 DIAGNOSIS — F0283 Dementia in other diseases classified elsewhere, unspecified severity, with mood disturbance: Secondary | ICD-10-CM | POA: Diagnosis not present

## 2022-04-17 DIAGNOSIS — G20A1 Parkinson's disease without dyskinesia, without mention of fluctuations: Secondary | ICD-10-CM | POA: Diagnosis not present

## 2022-04-17 DIAGNOSIS — I4891 Unspecified atrial fibrillation: Secondary | ICD-10-CM | POA: Diagnosis not present

## 2022-04-17 DIAGNOSIS — I69351 Hemiplegia and hemiparesis following cerebral infarction affecting right dominant side: Secondary | ICD-10-CM | POA: Diagnosis not present

## 2022-04-17 DIAGNOSIS — F0153 Vascular dementia, unspecified severity, with mood disturbance: Secondary | ICD-10-CM | POA: Diagnosis not present

## 2022-04-17 DIAGNOSIS — F319 Bipolar disorder, unspecified: Secondary | ICD-10-CM | POA: Diagnosis not present

## 2022-04-17 DIAGNOSIS — F0283 Dementia in other diseases classified elsewhere, unspecified severity, with mood disturbance: Secondary | ICD-10-CM | POA: Diagnosis not present

## 2022-04-17 DIAGNOSIS — E785 Hyperlipidemia, unspecified: Secondary | ICD-10-CM | POA: Diagnosis not present

## 2022-04-17 DIAGNOSIS — G309 Alzheimer's disease, unspecified: Secondary | ICD-10-CM | POA: Diagnosis not present

## 2022-04-17 DIAGNOSIS — G2581 Restless legs syndrome: Secondary | ICD-10-CM | POA: Diagnosis not present

## 2022-04-18 DIAGNOSIS — F0283 Dementia in other diseases classified elsewhere, unspecified severity, with mood disturbance: Secondary | ICD-10-CM | POA: Diagnosis not present

## 2022-04-18 DIAGNOSIS — G2581 Restless legs syndrome: Secondary | ICD-10-CM | POA: Diagnosis not present

## 2022-04-18 DIAGNOSIS — F319 Bipolar disorder, unspecified: Secondary | ICD-10-CM | POA: Diagnosis not present

## 2022-04-18 DIAGNOSIS — I4891 Unspecified atrial fibrillation: Secondary | ICD-10-CM | POA: Diagnosis not present

## 2022-04-18 DIAGNOSIS — G20A1 Parkinson's disease without dyskinesia, without mention of fluctuations: Secondary | ICD-10-CM | POA: Diagnosis not present

## 2022-04-18 DIAGNOSIS — E785 Hyperlipidemia, unspecified: Secondary | ICD-10-CM | POA: Diagnosis not present

## 2022-04-18 DIAGNOSIS — I69351 Hemiplegia and hemiparesis following cerebral infarction affecting right dominant side: Secondary | ICD-10-CM | POA: Diagnosis not present

## 2022-04-18 DIAGNOSIS — G309 Alzheimer's disease, unspecified: Secondary | ICD-10-CM | POA: Diagnosis not present

## 2022-04-18 DIAGNOSIS — F0153 Vascular dementia, unspecified severity, with mood disturbance: Secondary | ICD-10-CM | POA: Diagnosis not present

## 2022-04-22 DIAGNOSIS — F319 Bipolar disorder, unspecified: Secondary | ICD-10-CM | POA: Diagnosis not present

## 2022-04-22 DIAGNOSIS — I4891 Unspecified atrial fibrillation: Secondary | ICD-10-CM | POA: Diagnosis not present

## 2022-04-22 DIAGNOSIS — E785 Hyperlipidemia, unspecified: Secondary | ICD-10-CM | POA: Diagnosis not present

## 2022-04-22 DIAGNOSIS — G20A1 Parkinson's disease without dyskinesia, without mention of fluctuations: Secondary | ICD-10-CM | POA: Diagnosis not present

## 2022-04-22 DIAGNOSIS — I69351 Hemiplegia and hemiparesis following cerebral infarction affecting right dominant side: Secondary | ICD-10-CM | POA: Diagnosis not present

## 2022-04-22 DIAGNOSIS — G2581 Restless legs syndrome: Secondary | ICD-10-CM | POA: Diagnosis not present

## 2022-04-22 DIAGNOSIS — F0153 Vascular dementia, unspecified severity, with mood disturbance: Secondary | ICD-10-CM | POA: Diagnosis not present

## 2022-04-22 DIAGNOSIS — G309 Alzheimer's disease, unspecified: Secondary | ICD-10-CM | POA: Diagnosis not present

## 2022-04-22 DIAGNOSIS — F0283 Dementia in other diseases classified elsewhere, unspecified severity, with mood disturbance: Secondary | ICD-10-CM | POA: Diagnosis not present

## 2022-04-23 DIAGNOSIS — F0150 Vascular dementia without behavioral disturbance: Secondary | ICD-10-CM | POA: Diagnosis not present

## 2022-04-23 DIAGNOSIS — F319 Bipolar disorder, unspecified: Secondary | ICD-10-CM | POA: Diagnosis not present

## 2022-04-23 DIAGNOSIS — I6990 Unspecified sequelae of unspecified cerebrovascular disease: Secondary | ICD-10-CM | POA: Diagnosis not present

## 2022-04-23 DIAGNOSIS — G4733 Obstructive sleep apnea (adult) (pediatric): Secondary | ICD-10-CM | POA: Diagnosis not present

## 2022-04-23 DIAGNOSIS — E785 Hyperlipidemia, unspecified: Secondary | ICD-10-CM | POA: Diagnosis not present

## 2022-04-23 DIAGNOSIS — G309 Alzheimer's disease, unspecified: Secondary | ICD-10-CM | POA: Diagnosis not present

## 2022-04-23 DIAGNOSIS — G20A1 Parkinson's disease without dyskinesia, without mention of fluctuations: Secondary | ICD-10-CM | POA: Diagnosis not present

## 2022-04-23 DIAGNOSIS — F0153 Vascular dementia, unspecified severity, with mood disturbance: Secondary | ICD-10-CM | POA: Diagnosis not present

## 2022-04-23 DIAGNOSIS — F0283 Dementia in other diseases classified elsewhere, unspecified severity, with mood disturbance: Secondary | ICD-10-CM | POA: Diagnosis not present

## 2022-04-23 DIAGNOSIS — R351 Nocturia: Secondary | ICD-10-CM | POA: Diagnosis not present

## 2022-04-23 DIAGNOSIS — R2689 Other abnormalities of gait and mobility: Secondary | ICD-10-CM | POA: Diagnosis not present

## 2022-04-23 DIAGNOSIS — I69351 Hemiplegia and hemiparesis following cerebral infarction affecting right dominant side: Secondary | ICD-10-CM | POA: Diagnosis not present

## 2022-04-23 DIAGNOSIS — G2581 Restless legs syndrome: Secondary | ICD-10-CM | POA: Diagnosis not present

## 2022-04-23 DIAGNOSIS — Z0000 Encounter for general adult medical examination without abnormal findings: Secondary | ICD-10-CM | POA: Diagnosis not present

## 2022-04-23 DIAGNOSIS — D649 Anemia, unspecified: Secondary | ICD-10-CM | POA: Diagnosis not present

## 2022-04-23 DIAGNOSIS — I4891 Unspecified atrial fibrillation: Secondary | ICD-10-CM | POA: Diagnosis not present

## 2022-04-25 DIAGNOSIS — G2581 Restless legs syndrome: Secondary | ICD-10-CM | POA: Diagnosis not present

## 2022-04-25 DIAGNOSIS — I4891 Unspecified atrial fibrillation: Secondary | ICD-10-CM | POA: Diagnosis not present

## 2022-04-25 DIAGNOSIS — E785 Hyperlipidemia, unspecified: Secondary | ICD-10-CM | POA: Diagnosis not present

## 2022-04-25 DIAGNOSIS — I69351 Hemiplegia and hemiparesis following cerebral infarction affecting right dominant side: Secondary | ICD-10-CM | POA: Diagnosis not present

## 2022-04-25 DIAGNOSIS — F0153 Vascular dementia, unspecified severity, with mood disturbance: Secondary | ICD-10-CM | POA: Diagnosis not present

## 2022-04-25 DIAGNOSIS — G309 Alzheimer's disease, unspecified: Secondary | ICD-10-CM | POA: Diagnosis not present

## 2022-04-25 DIAGNOSIS — F0283 Dementia in other diseases classified elsewhere, unspecified severity, with mood disturbance: Secondary | ICD-10-CM | POA: Diagnosis not present

## 2022-04-25 DIAGNOSIS — G20A1 Parkinson's disease without dyskinesia, without mention of fluctuations: Secondary | ICD-10-CM | POA: Diagnosis not present

## 2022-04-25 DIAGNOSIS — F319 Bipolar disorder, unspecified: Secondary | ICD-10-CM | POA: Diagnosis not present

## 2022-04-28 DIAGNOSIS — G20A1 Parkinson's disease without dyskinesia, without mention of fluctuations: Secondary | ICD-10-CM | POA: Diagnosis not present

## 2022-04-28 DIAGNOSIS — F0153 Vascular dementia, unspecified severity, with mood disturbance: Secondary | ICD-10-CM | POA: Diagnosis not present

## 2022-04-28 DIAGNOSIS — F319 Bipolar disorder, unspecified: Secondary | ICD-10-CM | POA: Diagnosis not present

## 2022-04-28 DIAGNOSIS — I4891 Unspecified atrial fibrillation: Secondary | ICD-10-CM | POA: Diagnosis not present

## 2022-04-28 DIAGNOSIS — E785 Hyperlipidemia, unspecified: Secondary | ICD-10-CM | POA: Diagnosis not present

## 2022-04-28 DIAGNOSIS — I69351 Hemiplegia and hemiparesis following cerebral infarction affecting right dominant side: Secondary | ICD-10-CM | POA: Diagnosis not present

## 2022-04-28 DIAGNOSIS — F0283 Dementia in other diseases classified elsewhere, unspecified severity, with mood disturbance: Secondary | ICD-10-CM | POA: Diagnosis not present

## 2022-04-28 DIAGNOSIS — G2581 Restless legs syndrome: Secondary | ICD-10-CM | POA: Diagnosis not present

## 2022-04-28 DIAGNOSIS — G309 Alzheimer's disease, unspecified: Secondary | ICD-10-CM | POA: Diagnosis not present

## 2022-04-30 DIAGNOSIS — G309 Alzheimer's disease, unspecified: Secondary | ICD-10-CM | POA: Diagnosis not present

## 2022-04-30 DIAGNOSIS — F0153 Vascular dementia, unspecified severity, with mood disturbance: Secondary | ICD-10-CM | POA: Diagnosis not present

## 2022-04-30 DIAGNOSIS — I4891 Unspecified atrial fibrillation: Secondary | ICD-10-CM | POA: Diagnosis not present

## 2022-04-30 DIAGNOSIS — G20A1 Parkinson's disease without dyskinesia, without mention of fluctuations: Secondary | ICD-10-CM | POA: Diagnosis not present

## 2022-04-30 DIAGNOSIS — F319 Bipolar disorder, unspecified: Secondary | ICD-10-CM | POA: Diagnosis not present

## 2022-04-30 DIAGNOSIS — G2581 Restless legs syndrome: Secondary | ICD-10-CM | POA: Diagnosis not present

## 2022-04-30 DIAGNOSIS — I69351 Hemiplegia and hemiparesis following cerebral infarction affecting right dominant side: Secondary | ICD-10-CM | POA: Diagnosis not present

## 2022-04-30 DIAGNOSIS — E785 Hyperlipidemia, unspecified: Secondary | ICD-10-CM | POA: Diagnosis not present

## 2022-04-30 DIAGNOSIS — F0283 Dementia in other diseases classified elsewhere, unspecified severity, with mood disturbance: Secondary | ICD-10-CM | POA: Diagnosis not present

## 2022-05-02 DIAGNOSIS — I69351 Hemiplegia and hemiparesis following cerebral infarction affecting right dominant side: Secondary | ICD-10-CM | POA: Diagnosis not present

## 2022-05-02 DIAGNOSIS — G309 Alzheimer's disease, unspecified: Secondary | ICD-10-CM | POA: Diagnosis not present

## 2022-05-02 DIAGNOSIS — E785 Hyperlipidemia, unspecified: Secondary | ICD-10-CM | POA: Diagnosis not present

## 2022-05-02 DIAGNOSIS — F0283 Dementia in other diseases classified elsewhere, unspecified severity, with mood disturbance: Secondary | ICD-10-CM | POA: Diagnosis not present

## 2022-05-02 DIAGNOSIS — F0153 Vascular dementia, unspecified severity, with mood disturbance: Secondary | ICD-10-CM | POA: Diagnosis not present

## 2022-05-02 DIAGNOSIS — F319 Bipolar disorder, unspecified: Secondary | ICD-10-CM | POA: Diagnosis not present

## 2022-05-02 DIAGNOSIS — G2581 Restless legs syndrome: Secondary | ICD-10-CM | POA: Diagnosis not present

## 2022-05-02 DIAGNOSIS — G20A1 Parkinson's disease without dyskinesia, without mention of fluctuations: Secondary | ICD-10-CM | POA: Diagnosis not present

## 2022-05-02 DIAGNOSIS — I4891 Unspecified atrial fibrillation: Secondary | ICD-10-CM | POA: Diagnosis not present

## 2022-05-05 DIAGNOSIS — F0283 Dementia in other diseases classified elsewhere, unspecified severity, with mood disturbance: Secondary | ICD-10-CM | POA: Diagnosis not present

## 2022-05-05 DIAGNOSIS — G309 Alzheimer's disease, unspecified: Secondary | ICD-10-CM | POA: Diagnosis not present

## 2022-05-05 DIAGNOSIS — F0153 Vascular dementia, unspecified severity, with mood disturbance: Secondary | ICD-10-CM | POA: Diagnosis not present

## 2022-05-05 DIAGNOSIS — F319 Bipolar disorder, unspecified: Secondary | ICD-10-CM | POA: Diagnosis not present

## 2022-05-05 DIAGNOSIS — I4891 Unspecified atrial fibrillation: Secondary | ICD-10-CM | POA: Diagnosis not present

## 2022-05-05 DIAGNOSIS — G20A1 Parkinson's disease without dyskinesia, without mention of fluctuations: Secondary | ICD-10-CM | POA: Diagnosis not present

## 2022-05-05 DIAGNOSIS — E785 Hyperlipidemia, unspecified: Secondary | ICD-10-CM | POA: Diagnosis not present

## 2022-05-05 DIAGNOSIS — I69351 Hemiplegia and hemiparesis following cerebral infarction affecting right dominant side: Secondary | ICD-10-CM | POA: Diagnosis not present

## 2022-05-05 DIAGNOSIS — G2581 Restless legs syndrome: Secondary | ICD-10-CM | POA: Diagnosis not present

## 2022-05-09 DIAGNOSIS — G20A1 Parkinson's disease without dyskinesia, without mention of fluctuations: Secondary | ICD-10-CM | POA: Diagnosis not present

## 2022-05-09 DIAGNOSIS — I4891 Unspecified atrial fibrillation: Secondary | ICD-10-CM | POA: Diagnosis not present

## 2022-05-09 DIAGNOSIS — I69351 Hemiplegia and hemiparesis following cerebral infarction affecting right dominant side: Secondary | ICD-10-CM | POA: Diagnosis not present

## 2022-05-09 DIAGNOSIS — E785 Hyperlipidemia, unspecified: Secondary | ICD-10-CM | POA: Diagnosis not present

## 2022-05-09 DIAGNOSIS — F0283 Dementia in other diseases classified elsewhere, unspecified severity, with mood disturbance: Secondary | ICD-10-CM | POA: Diagnosis not present

## 2022-05-09 DIAGNOSIS — G309 Alzheimer's disease, unspecified: Secondary | ICD-10-CM | POA: Diagnosis not present

## 2022-05-09 DIAGNOSIS — F319 Bipolar disorder, unspecified: Secondary | ICD-10-CM | POA: Diagnosis not present

## 2022-05-09 DIAGNOSIS — G2581 Restless legs syndrome: Secondary | ICD-10-CM | POA: Diagnosis not present

## 2022-05-09 DIAGNOSIS — F0153 Vascular dementia, unspecified severity, with mood disturbance: Secondary | ICD-10-CM | POA: Diagnosis not present

## 2022-05-12 ENCOUNTER — Ambulatory Visit: Payer: Medicare PPO

## 2022-05-13 DIAGNOSIS — Z6824 Body mass index (BMI) 24.0-24.9, adult: Secondary | ICD-10-CM | POA: Diagnosis not present

## 2022-05-13 DIAGNOSIS — G4733 Obstructive sleep apnea (adult) (pediatric): Secondary | ICD-10-CM | POA: Diagnosis not present

## 2022-05-14 DIAGNOSIS — I69351 Hemiplegia and hemiparesis following cerebral infarction affecting right dominant side: Secondary | ICD-10-CM | POA: Diagnosis not present

## 2022-05-14 DIAGNOSIS — F319 Bipolar disorder, unspecified: Secondary | ICD-10-CM | POA: Diagnosis not present

## 2022-05-14 DIAGNOSIS — G20A1 Parkinson's disease without dyskinesia, without mention of fluctuations: Secondary | ICD-10-CM | POA: Diagnosis not present

## 2022-05-14 DIAGNOSIS — G309 Alzheimer's disease, unspecified: Secondary | ICD-10-CM | POA: Diagnosis not present

## 2022-05-14 DIAGNOSIS — F0153 Vascular dementia, unspecified severity, with mood disturbance: Secondary | ICD-10-CM | POA: Diagnosis not present

## 2022-05-14 DIAGNOSIS — E785 Hyperlipidemia, unspecified: Secondary | ICD-10-CM | POA: Diagnosis not present

## 2022-05-14 DIAGNOSIS — G2581 Restless legs syndrome: Secondary | ICD-10-CM | POA: Diagnosis not present

## 2022-05-14 DIAGNOSIS — F0283 Dementia in other diseases classified elsewhere, unspecified severity, with mood disturbance: Secondary | ICD-10-CM | POA: Diagnosis not present

## 2022-05-14 DIAGNOSIS — I4891 Unspecified atrial fibrillation: Secondary | ICD-10-CM | POA: Diagnosis not present

## 2022-05-20 NOTE — Progress Notes (Signed)
[employer] Loop Recorder  

## 2022-05-21 DIAGNOSIS — H2513 Age-related nuclear cataract, bilateral: Secondary | ICD-10-CM | POA: Diagnosis not present

## 2022-05-22 DIAGNOSIS — F0153 Vascular dementia, unspecified severity, with mood disturbance: Secondary | ICD-10-CM | POA: Diagnosis not present

## 2022-05-22 DIAGNOSIS — G2581 Restless legs syndrome: Secondary | ICD-10-CM | POA: Diagnosis not present

## 2022-05-22 DIAGNOSIS — E785 Hyperlipidemia, unspecified: Secondary | ICD-10-CM | POA: Diagnosis not present

## 2022-05-22 DIAGNOSIS — G309 Alzheimer's disease, unspecified: Secondary | ICD-10-CM | POA: Diagnosis not present

## 2022-05-22 DIAGNOSIS — G20A1 Parkinson's disease without dyskinesia, without mention of fluctuations: Secondary | ICD-10-CM | POA: Diagnosis not present

## 2022-05-22 DIAGNOSIS — I69351 Hemiplegia and hemiparesis following cerebral infarction affecting right dominant side: Secondary | ICD-10-CM | POA: Diagnosis not present

## 2022-05-22 DIAGNOSIS — F0283 Dementia in other diseases classified elsewhere, unspecified severity, with mood disturbance: Secondary | ICD-10-CM | POA: Diagnosis not present

## 2022-05-22 DIAGNOSIS — F319 Bipolar disorder, unspecified: Secondary | ICD-10-CM | POA: Diagnosis not present

## 2022-05-22 DIAGNOSIS — I4891 Unspecified atrial fibrillation: Secondary | ICD-10-CM | POA: Diagnosis not present

## 2022-05-23 DIAGNOSIS — I69351 Hemiplegia and hemiparesis following cerebral infarction affecting right dominant side: Secondary | ICD-10-CM | POA: Diagnosis not present

## 2022-05-23 DIAGNOSIS — G309 Alzheimer's disease, unspecified: Secondary | ICD-10-CM | POA: Diagnosis not present

## 2022-05-23 DIAGNOSIS — F319 Bipolar disorder, unspecified: Secondary | ICD-10-CM | POA: Diagnosis not present

## 2022-05-23 DIAGNOSIS — E785 Hyperlipidemia, unspecified: Secondary | ICD-10-CM | POA: Diagnosis not present

## 2022-05-23 DIAGNOSIS — I4891 Unspecified atrial fibrillation: Secondary | ICD-10-CM | POA: Diagnosis not present

## 2022-05-23 DIAGNOSIS — F0283 Dementia in other diseases classified elsewhere, unspecified severity, with mood disturbance: Secondary | ICD-10-CM | POA: Diagnosis not present

## 2022-05-23 DIAGNOSIS — F0153 Vascular dementia, unspecified severity, with mood disturbance: Secondary | ICD-10-CM | POA: Diagnosis not present

## 2022-05-23 DIAGNOSIS — G20A1 Parkinson's disease without dyskinesia, without mention of fluctuations: Secondary | ICD-10-CM | POA: Diagnosis not present

## 2022-05-23 DIAGNOSIS — G2581 Restless legs syndrome: Secondary | ICD-10-CM | POA: Diagnosis not present

## 2022-06-16 ENCOUNTER — Ambulatory Visit: Payer: Medicare PPO

## 2022-06-24 DIAGNOSIS — R2689 Other abnormalities of gait and mobility: Secondary | ICD-10-CM | POA: Diagnosis not present

## 2022-06-25 ENCOUNTER — Emergency Department (HOSPITAL_COMMUNITY): Payer: Medicare PPO

## 2022-06-25 ENCOUNTER — Other Ambulatory Visit: Payer: Self-pay

## 2022-06-25 ENCOUNTER — Encounter (HOSPITAL_COMMUNITY): Payer: Self-pay

## 2022-06-25 ENCOUNTER — Emergency Department (HOSPITAL_COMMUNITY)
Admission: EM | Admit: 2022-06-25 | Discharge: 2022-06-26 | Disposition: A | Discharge status: Home / Self Care | Payer: Medicare PPO | Attending: Emergency Medicine | Admitting: Emergency Medicine

## 2022-06-25 DIAGNOSIS — R5383 Other fatigue: Secondary | ICD-10-CM | POA: Insufficient documentation

## 2022-06-25 DIAGNOSIS — D696 Thrombocytopenia, unspecified: Secondary | ICD-10-CM | POA: Diagnosis not present

## 2022-06-25 DIAGNOSIS — Z8673 Personal history of transient ischemic attack (TIA), and cerebral infarction without residual deficits: Secondary | ICD-10-CM | POA: Diagnosis not present

## 2022-06-25 DIAGNOSIS — Z91040 Latex allergy status: Secondary | ICD-10-CM | POA: Diagnosis not present

## 2022-06-25 DIAGNOSIS — Z79899 Other long term (current) drug therapy: Secondary | ICD-10-CM | POA: Diagnosis not present

## 2022-06-25 DIAGNOSIS — S0990XA Unspecified injury of head, initial encounter: Secondary | ICD-10-CM | POA: Diagnosis not present

## 2022-06-25 DIAGNOSIS — F0390 Unspecified dementia without behavioral disturbance: Secondary | ICD-10-CM | POA: Insufficient documentation

## 2022-06-25 DIAGNOSIS — Z7901 Long term (current) use of anticoagulants: Secondary | ICD-10-CM | POA: Insufficient documentation

## 2022-06-25 DIAGNOSIS — W19XXXA Unspecified fall, initial encounter: Secondary | ICD-10-CM | POA: Diagnosis not present

## 2022-06-25 DIAGNOSIS — Z1152 Encounter for screening for COVID-19: Secondary | ICD-10-CM | POA: Insufficient documentation

## 2022-06-25 DIAGNOSIS — Z043 Encounter for examination and observation following other accident: Secondary | ICD-10-CM | POA: Diagnosis not present

## 2022-06-25 DIAGNOSIS — I4891 Unspecified atrial fibrillation: Secondary | ICD-10-CM | POA: Diagnosis not present

## 2022-06-25 DIAGNOSIS — I10 Essential (primary) hypertension: Secondary | ICD-10-CM | POA: Insufficient documentation

## 2022-06-25 LAB — COMPREHENSIVE METABOLIC PANEL
ALT: 21 U/L (ref 0–44)
AST: 24 U/L (ref 15–41)
Albumin: 3.9 g/dL (ref 3.5–5.0)
Alkaline Phosphatase: 89 U/L (ref 38–126)
Anion gap: 9 (ref 5–15)
BUN: 37 mg/dL — ABNORMAL HIGH (ref 8–23)
CO2: 25 mmol/L (ref 22–32)
Calcium: 10.4 mg/dL — ABNORMAL HIGH (ref 8.9–10.3)
Chloride: 104 mmol/L (ref 98–111)
Creatinine, Ser: 1.53 mg/dL — ABNORMAL HIGH (ref 0.61–1.24)
GFR, Estimated: 46 mL/min — ABNORMAL LOW (ref >60)
Glucose, Bld: 169 mg/dL — ABNORMAL HIGH (ref 70–99)
Potassium: 4.4 mmol/L (ref 3.5–5.1)
Sodium: 138 mmol/L (ref 135–145)
Total Bilirubin: 0.5 mg/dL (ref 0.3–1.2)
Total Protein: 6.9 g/dL (ref 6.5–8.1)

## 2022-06-25 LAB — CBC WITH DIFFERENTIAL/PLATELET
Abs Immature Granulocytes: 0.01 10*3/uL (ref 0.00–0.07)
Basophils Absolute: 0 10*3/uL (ref 0.0–0.1)
Basophils Relative: 1 %
Eosinophils Absolute: 0.1 10*3/uL (ref 0.0–0.5)
Eosinophils Relative: 3 %
HCT: 38.6 % — ABNORMAL LOW (ref 39.0–52.0)
Hemoglobin: 12.3 g/dL — ABNORMAL LOW (ref 13.0–17.0)
Immature Granulocytes: 0 %
Lymphocytes Relative: 25 %
Lymphs Abs: 1.3 10*3/uL (ref 0.7–4.0)
MCH: 30.8 pg (ref 26.0–34.0)
MCHC: 31.9 g/dL (ref 30.0–36.0)
MCV: 96.7 fL (ref 80.0–100.0)
Monocytes Absolute: 0.4 10*3/uL (ref 0.1–1.0)
Monocytes Relative: 7 %
Neutro Abs: 3.5 10*3/uL (ref 1.7–7.7)
Neutrophils Relative %: 64 %
Platelets: 131 10*3/uL — ABNORMAL LOW (ref 150–400)
RBC: 3.99 MIL/uL — ABNORMAL LOW (ref 4.22–5.81)
RDW: 13.2 % (ref 11.5–15.5)
WBC: 5.4 10*3/uL (ref 4.0–10.5)
nRBC: 0 % (ref 0.0–0.2)

## 2022-06-25 LAB — URINALYSIS, ROUTINE W REFLEX MICROSCOPIC
Bilirubin Urine: NEGATIVE
Glucose, UA: NEGATIVE mg/dL
Hgb urine dipstick: NEGATIVE
Ketones, ur: NEGATIVE mg/dL
Leukocytes,Ua: NEGATIVE
Nitrite: NEGATIVE
Protein, ur: NEGATIVE mg/dL
Specific Gravity, Urine: 1.025 (ref 1.005–1.030)
pH: 5 (ref 5.0–8.0)

## 2022-06-25 MED ORDER — LACTATED RINGERS IV BOLUS
500.0000 mL | Freq: Once | INTRAVENOUS | Status: AC
  Administered 2022-06-25: 500 mL via INTRAVENOUS

## 2022-06-25 NOTE — Discharge Instructions (Addendum)
Please call your family doctor today and let them know about your visit to the emergency department.

## 2022-06-25 NOTE — ED Notes (Signed)
Patient returned from CT at this time.

## 2022-06-25 NOTE — ED Triage Notes (Signed)
Patient BIB EMS from home with complaint of GL fall on blood thinners. Per wife patient fell yesterday hitting head on end table. Wife believes that patient may have fell today but is unable to confirm.   Xarelto  PMHX: Blood thinners Dementia

## 2022-06-25 NOTE — ED Provider Notes (Signed)
[name] EMERGENCY DEPARTMENT AT [facility] Provider Note   CSN: [id number] Arrival date & time: 06/25/22  2037     History  Chief Complaint  Patient presents with   [name] [name] is a 77 y.o. male with dementia, OSA on CPAP, h/o CVA, Afib on xarelto, RLS, bipolar disorder, h/o cardiogenic syncope who presents with fall.   History provided by EMS, and later wife at bedside. Yesterday patient had a GL fall in which he hit his head on a table. No LOC, nausea/vomiting. Wife believes that patient may have fallen today but she is unable to confirm. He does take Xarelto for afib. Wife states that he seemed to be not acting like himself earlier today, much more fatigued than usual. He does have h/o syncope but he didn't pass out prior to or after falling yesterday. The patient has no complaints, does state that he fell yesterday but doesn't know if he fell today, he denies any pain anywhere including head/neck/chest/abdomen/pelvis. Denies urinary symptoms, N/V/D.       Fall       Home Medications Prior to Admission medications   Medication Sig Start Date End Date Taking? Authorizing Provider  atorvastatin (LIPITOR) 40 MG tablet Take 40 mg by mouth every evening. 11/27/15   [provider]  b complex vitamins tablet Take 1 tablet by mouth daily.    [provider]  carbidopa-levodopa (SINEMET IR) 25-250 MG per tablet Take 1 tablet by mouth 2 (two) times daily.     [provider]  divalproex (DEPAKOTE) 250 MG DR tablet Take 1 tablet by mouth every evening. 10/28/19   [provider]  docusate sodium (COLACE) 100 MG capsule Take 1 capsule (100 mg total) by mouth 2 (two) times daily. 06/05/17   Swinteck, Arlys John, MD  donepezil (ARICEPT) 10 MG tablet Take 1 tablet (10 mg total) by mouth at bedtime. 08/08/14   Everlena Cooper, Adam R, DO  Evolocumab (REPATHA SURECLICK) 140 MG/ML SOAJ Inject 140 mg into the skin every 14 (fourteen) days.    [provider]  fluocinonide gel (LIDEX) 0.05 % Apply 1 application topically 2 (two) times daily.    [provider]  folic acid (FOLVITE) 1 MG tablet Take 1 mg by mouth at bedtime.     [provider]  gabapentin (NEURONTIN) 300 MG capsule Take 1 capsule by mouth 2 (two) times daily. 10/28/19   [provider]  lamoTRIgine (LAMICTAL) 150 MG tablet Take 150 mg by mouth 2 (two) times daily.    [provider]  lisinopril (PRINIVIL,ZESTRIL) 10 MG tablet Take 10 mg by mouth every evening.    [provider]  memantine (NAMENDA) 10 MG tablet Take 10 mg by mouth 2 (two) times daily.     [provider]  metroNIDAZOLE (METROGEL) 0.75 % gel Apply 1 application topically 2 (two) times daily.    [provider]  rivaroxaban (XARELTO) 20 MG TABS tablet Take 20 mg by mouth daily.    [provider]      Allergies    Latex    Review of Systems   Review of Systems Review of systems Negative for f/c.  A 10 point review of systems was performed and is negative unless otherwise reported in HPI.  Physical Exam Updated Vital Signs BP 121/79   Pulse (!) 46   Temp 97.9 F (36.6 C) (Oral)   Resp 12   Ht 5\' 11"  (1.803 m)  Wt 79.4 kg   SpO2 96%   BMI 24.41 kg/m  Physical Exam General: Normal appearing male, lying in bed.  HEENT: PERRLA, EOMI, sclera anicteric, dry mucous membranes, trachea midline.  NCAT.  No skull depressions or deformities.  Tongue protrudes midline Cardiology: RRR, no murmurs/rubs/gallops. BL radial and DP pulses equal bilaterally.  No tenderness palpation to the chest wall Resp: Normal respiratory rate and effort. CTAB, no wheezes, rhonchi, crackles.  Abd: Soft, non-tender, non-distended. No rebound tenderness or guarding.  GU: Pelvis stable nontender.  No blood at urethral meatus.. MSK: No peripheral edema or signs of trauma. Extremities without deformity or TTP. No cyanosis or clubbing. Skin: warm, dry. No  rashes or lesions. Back: No midline CT or L-spine tenderness palpation or step-offs Neuro: A&Ox1-2 (baseline per family at bedside), CNs II-XII grossly intact.  5 out of 5 strength in all 4 extremities.. Sensation grossly intact.  Psych: Normal mood and affect.   ED Results / Procedures / Treatments   Labs (all labs ordered are listed, but only abnormal results are displayed) Labs Reviewed  CBC WITH DIFFERENTIAL/PLATELET - Abnormal; Notable for the following components:      Result Value   RBC 3.99 (*)    Hemoglobin 12.3 (*)    HCT 38.6 (*)    Platelets 131 (*)    All other components within normal limits  COMPREHENSIVE METABOLIC PANEL - Abnormal; Notable for the following components:   Glucose, Bld 169 (*)    BUN 37 (*)    Creatinine, Ser 1.53 (*)    Calcium 10.4 (*)    GFR, Estimated 46 (*)    All other components within normal limits  RESP PANEL BY RT-PCR (RSV, FLU A&B, COVID)  RVPGX2  URINALYSIS, ROUTINE W REFLEX MICROSCOPIC    EKG EKG Interpretation  Date/Time:  Wednesday June 25 2022 20:44:57 EDT Ventricular Rate:  55 PR Interval:    QRS Duration: 106 QT Interval:  452 QTC Calculation: 433 R Axis:   78 Text Interpretation: Atrial fibrillation Confirmed by Vivi Barrack [phone]) on 06/25/2022 10:43:17 PM  Radiology See ED course  Procedures Procedures    Medications Ordered in ED Medications  lactated ringers bolus 500 mL (0 mLs Intravenous Stopped 06/25/22 2332)    ED Course/ Medical Decision Making/ A&P                          Medical Decision Making Amount and/or Complexity of Data Reviewed Labs: ordered. Decision-making details documented in ED Course. Radiology: ordered. Decision-making details documented in ED Course.    This patient presents to the ED for concern of fall, this involves an extensive number of treatment options, and is a complaint that carries with it a high risk of complications and morbidity.  I considered the following  differential and admission for this acute, potentially life threatening condition.   MDM:    Patient with dementia who fell before.  He has no complaints at this time and no evidence of trauma.  Given his dementia will obtain basic imaging including CT head, pelvis and chest x-rays to rule out ICH, fractures.  Will also obtain basic labs given that he may have fallen twice in 2 days, consider causes of generalized weakness or fatigue such as anemia, infection like UTI or pneumonia though he reports no symptoms of such, dehydration.  He has no focal neurodeficits to indicate a CVA.  Meds are reviewed and he does not take many sedating  medications, just gabapentin, has not had any recent changes to this medicine, but still could consider polypharmacy.  Clinical Course as of 07/04/22 1205  Wed Jun 25, 2022  2241 Creatinine(!): 1.53 Very mild increase from BL 1.3-1.4 [HN]  2241 BUN(!): 37 Likely dehydrated, will give 500 cc fluid bolus [HN]  2242 Platelets(!): 131 Mild thrombocytopenia c/w previous values [HN]  2242 CT Head Wo Contrast 1. No acute intracranial process. 2. Atrophy with chronic microvascular ischemic changes. 3. Stable old infarcts in the frontal and parietal lobes on the right.   [HN]  2242 DG Pelvis 1-2 Views 1. No acute displaced fracture. 2. Unremarkable right hip arthroplasty. 3. Stable left hip osteoarthritis.  [HN]  2242 DG CHEST PORT 1 VIEW 1. Enlarged cardiac silhouette. 2. No acute intrathoracic process. [HN]    Clinical Course User Index [HN] Loetta Rough, MD    Labs: I Ordered, and personally interpreted labs.  The pertinent results include:  those listed above  Imaging Studies ordered: I ordered imaging studies including CTH, CXR, PXR I independently visualized and interpreted imaging. I agree with the radiologist interpretation  Additional history obtained from EMS, wife, chart review.    Cardiac Monitoring: The patient was maintained on a  cardiac monitor.  I personally viewed and interpreted the cardiac monitored which showed an underlying rhythm of: NSR  Reevaluation: After the interventions noted above, I reevaluated the patient and found that they have :improved  Social Determinants of Health: Patient lives with his wife and family  Disposition: Patient ultimately has very reassuring workup that includes possible dehydration.  Negative traumatic workup.  Patient remains in the ED for greater than 4 hours and continues to endorse that he is asymptomatic.  Wife and child are at bedside and they state that he appears better from earlier after small fluid bolus.  It is possible that he was dehydrated and they report that he does not drink almost any fluids.  On further discussion they relay that he seems to be a fall risk at home but that they would like to continue to care for him at home.  He still has a urine Alysis pending.  Patient will be signed out to the oncoming physician pending a urinalysis as well as a road test to evaluate patient's gait.  If he requires too much assistance at home will do shared decision-making with the family about placement.    Co morbidities that complicate the patient evaluation  Past Medical History:  Diagnosis Date   Alzheimer disease (HCC)    Anxiety    Arthritis    Atrial fibrillation (HCC)    Chronic   Bipolar disorder (HCC)    Chronic low back pain    Dementia (HCC)    Depression    Dizziness    Dysrhythmia    a fib   ED (erectile dysfunction)    External hemorrhoids    Fatigue    HLD (hyperlipidemia)    HTN (hypertension)    Hypogonadism in male    Lipoma of skin    Memory loss    Mixed Alzheimer's and vascular dementia (HCC)    Poor balance    Restless legs syndrome (RLS)    Sleep apnea    cpap   Stroke (HCC) 1991   Tremor      Medicines Meds ordered this encounter  Medications   lactated ringers bolus 500 mL    I have reviewed the patients home medicines and  have made adjustments as  needed  Problem List / ED Course: Problem List Items Addressed This Visit   None Visit Diagnoses     Other fatigue    -  Primary                   This note was created using dictation software, which may contain spelling or grammatical errors.    Loetta Rough, MD 07/04/22 [phone]

## 2022-06-25 NOTE — ED Notes (Signed)
Patient transported to CT by Alycia Rossetti RN trauma

## 2022-06-25 NOTE — ED Notes (Signed)
Trauma Response Nurse Documentation   TORBEN SOLOWAY is a 77 y.o. male arriving to [facility] ED via [facility]  On Xarelto (rivaroxaban) daily. Trauma was activated as a Level 2 by Clide Cliff based on the following trauma criteria Elderly patients > 65 with head trauma on anti-coagulation (excluding ASA).  Patient cleared for CT by Dr. Jearld Fenton. Pt transported to CT with trauma response nurse present to monitor. RN remained with the patient throughout their absence from the department for clinical observation.   GCS 14 (patients baseline).  History   Past Medical History:  Diagnosis Date   Alzheimer disease    Anxiety    Arthritis    Atrial fibrillation    Chronic   Bipolar disorder    Chronic low back pain    Dementia    Depression    Dizziness    Dysrhythmia    a fib   ED (erectile dysfunction)    External hemorrhoids    Fatigue    HLD (hyperlipidemia)    HTN (hypertension)    Hypogonadism in male    Lipoma of skin    Memory loss    Mixed Alzheimer's and vascular dementia    Poor balance    Restless legs syndrome (RLS)    Sleep apnea    cpap   Stroke 1991   Tremor      Past Surgical History:  Procedure Laterality Date   COLONOSCOPY     KNEE ARTHROPLASTY Left 06/04/2017   Procedure: LEFT TOTAL KNEE ARTHROPLASTY WITH COMPUTER NAVIGATION;  Surgeon: Samson Frederic, MD;  Location: [facility];  Service: Orthopedics;  Laterality: Left;  NEEDS RNFA   LOOP RECORDER INSERTION N/A 11/25/2021   Procedure: LOOP RECORDER INSERTION;  Surgeon: Duke Salvia, MD;  Location: [facility] INVASIVE CV LAB;  Service: Cardiovascular;  Laterality: N/A;   TONSILLECTOMY AND ADENOIDECTOMY     TOTAL HIP ARTHROPLASTY Right 07/31/2016   Procedure: RIGHT TOTAL HIP ARTHROPLASTY ANTERIOR APPROACH;  Surgeon: Samson Frederic, MD;  Location: [facility];  Service: Orthopedics;  Laterality: Right;  Requesting RNFA       Initial Focused Assessment (If applicable, or please see trauma  documentation): Airway-- intact, no visible obstruction Breathing-- spontaneous, unlabored Circulation-- no visible bleeding noted  CT's Completed:   CT Head   Interventions:  See event summary  Plan for disposition:  {Trauma Dispo:26867}   Consults completed:  {Trauma Consults:26862} at ***.  Event Summary: Patient brought in by [facility], family reports multiple falls yesterday and possibly today, all unwitnessed. Patient with dementia and is a GCS 14 at baseline. Patient denies any pain, no visible injuries. Manual BP obtained. Lab work obtained. Xray chest and pelvis completed. Patient to CT with TRN. CT head completed.   MTP Summary (If applicable):  N/A  Bedside handoff with ED RN [name].    Leota Sauers  Trauma Response RN  Please call TRN at [phone] for further assistance.

## 2022-06-25 NOTE — Progress Notes (Signed)
Chaplain stopped by for a visit.  Met pt, wife and another person.  A lively conversation ensued.  The pt was very pleasant, happy even.  The wife asked for prayers.  Chaplain invited family to bedside and prayed with them.  Vernell Morgans Chaplain

## 2022-06-26 ENCOUNTER — Ambulatory Visit (INDEPENDENT_AMBULATORY_CARE_PROVIDER_SITE_OTHER): Payer: Medicare PPO

## 2022-06-26 DIAGNOSIS — R55 Syncope and collapse: Secondary | ICD-10-CM

## 2022-06-26 LAB — RESP PANEL BY RT-PCR (RSV, FLU A&B, COVID)  RVPGX2
Influenza A by PCR: NEGATIVE
Influenza B by PCR: NEGATIVE
Resp Syncytial Virus by PCR: NEGATIVE
SARS Coronavirus 2 by RT PCR: NEGATIVE

## 2022-06-26 LAB — CUP PACEART REMOTE DEVICE CHECK
Date Time Interrogation Session: 20240425012013
Implantable Pulse Generator Implant Date: 20230925
Pulse Gen Serial Number: 511014105

## 2022-06-26 NOTE — ED Provider Notes (Signed)
Received patient in turnover from Dr. Jearld Fenton.  Please see their note for further details of Hx, PE.  Briefly patient is a 77 y.o. male with a Fall .  77 year old male with chief complaints of a fall.  Patient unfortunately has frequent falls it seems by history.  He had been also more sleepy today than normal.  Had some difficulty feeding himself per his daughter who is here.  Plan to await UA and attempt ambulation.  Patient was able to ambulate here with a walker.  Patient feels like he is at his baseline.  He would like to go home.  The daughter would like to keep him here at least overnight.  Patient adamantly would like to go home.  Do not feel it is right to hold him against his will.  UA has resulted and is negative for acute infection.Adela Lank, Jesusita Oka, DO 06/26/22 [phone]

## 2022-06-27 DIAGNOSIS — I482 Chronic atrial fibrillation, unspecified: Secondary | ICD-10-CM | POA: Diagnosis not present

## 2022-06-27 DIAGNOSIS — I129 Hypertensive chronic kidney disease with stage 1 through stage 4 chronic kidney disease, or unspecified chronic kidney disease: Secondary | ICD-10-CM | POA: Diagnosis not present

## 2022-06-27 DIAGNOSIS — I69354 Hemiplegia and hemiparesis following cerebral infarction affecting left non-dominant side: Secondary | ICD-10-CM | POA: Diagnosis not present

## 2022-06-27 DIAGNOSIS — F0150 Vascular dementia without behavioral disturbance: Secondary | ICD-10-CM | POA: Diagnosis not present

## 2022-06-27 DIAGNOSIS — F0280 Dementia in other diseases classified elsewhere without behavioral disturbance: Secondary | ICD-10-CM | POA: Diagnosis not present

## 2022-06-27 DIAGNOSIS — G309 Alzheimer's disease, unspecified: Secondary | ICD-10-CM | POA: Diagnosis not present

## 2022-06-27 DIAGNOSIS — G8929 Other chronic pain: Secondary | ICD-10-CM | POA: Diagnosis not present

## 2022-06-27 DIAGNOSIS — N182 Chronic kidney disease, stage 2 (mild): Secondary | ICD-10-CM | POA: Diagnosis not present

## 2022-06-27 DIAGNOSIS — G2581 Restless legs syndrome: Secondary | ICD-10-CM | POA: Diagnosis not present

## 2022-06-27 LAB — COLOGUARD

## 2022-07-02 ENCOUNTER — Telehealth: Payer: Self-pay

## 2022-07-02 NOTE — Telephone Encounter (Signed)
Transition Care Management Follow-up Telephone Call Date of discharge and from where: 4/25 [facility]  How have you been since you were released from the hospital? Doing good  Any questions or concerns? No  Items Reviewed: Did the pt receive and understand the discharge instructions provided? Yes  Medications obtained and verified? No  Other? No  Any new allergies since your discharge? No  Dietary orders reviewed? No Do you have support at home? Yes     Follow up appointments reviewed:  PCP Hospital f/u appt confirmed? Yes  Scheduled to see Merri Brunette MD on  @ . Specialist Hospital f/u appt confirmed?     Scheduled to see  on  @ . Are transportation arrangements needed? No  If their condition worsens, is the pt aware to call PCP or go to the Emergency Dept.? Yes Was the patient provided with contact information for the PCP's office or ED? Yes Was to pt encouraged to call back with questions or concerns? Yes

## 2022-07-03 DIAGNOSIS — G2581 Restless legs syndrome: Secondary | ICD-10-CM | POA: Diagnosis not present

## 2022-07-03 DIAGNOSIS — F0280 Dementia in other diseases classified elsewhere without behavioral disturbance: Secondary | ICD-10-CM | POA: Diagnosis not present

## 2022-07-03 DIAGNOSIS — I69354 Hemiplegia and hemiparesis following cerebral infarction affecting left non-dominant side: Secondary | ICD-10-CM | POA: Diagnosis not present

## 2022-07-03 DIAGNOSIS — I129 Hypertensive chronic kidney disease with stage 1 through stage 4 chronic kidney disease, or unspecified chronic kidney disease: Secondary | ICD-10-CM | POA: Diagnosis not present

## 2022-07-03 DIAGNOSIS — G309 Alzheimer's disease, unspecified: Secondary | ICD-10-CM | POA: Diagnosis not present

## 2022-07-03 DIAGNOSIS — F0150 Vascular dementia without behavioral disturbance: Secondary | ICD-10-CM | POA: Diagnosis not present

## 2022-07-03 DIAGNOSIS — N182 Chronic kidney disease, stage 2 (mild): Secondary | ICD-10-CM | POA: Diagnosis not present

## 2022-07-03 DIAGNOSIS — I482 Chronic atrial fibrillation, unspecified: Secondary | ICD-10-CM | POA: Diagnosis not present

## 2022-07-03 DIAGNOSIS — G8929 Other chronic pain: Secondary | ICD-10-CM | POA: Diagnosis not present

## 2022-07-04 DIAGNOSIS — F0280 Dementia in other diseases classified elsewhere without behavioral disturbance: Secondary | ICD-10-CM | POA: Diagnosis not present

## 2022-07-04 DIAGNOSIS — G309 Alzheimer's disease, unspecified: Secondary | ICD-10-CM | POA: Diagnosis not present

## 2022-07-04 DIAGNOSIS — G8929 Other chronic pain: Secondary | ICD-10-CM | POA: Diagnosis not present

## 2022-07-04 DIAGNOSIS — N182 Chronic kidney disease, stage 2 (mild): Secondary | ICD-10-CM | POA: Diagnosis not present

## 2022-07-04 DIAGNOSIS — G2581 Restless legs syndrome: Secondary | ICD-10-CM | POA: Diagnosis not present

## 2022-07-04 DIAGNOSIS — I482 Chronic atrial fibrillation, unspecified: Secondary | ICD-10-CM | POA: Diagnosis not present

## 2022-07-04 DIAGNOSIS — F0150 Vascular dementia without behavioral disturbance: Secondary | ICD-10-CM | POA: Diagnosis not present

## 2022-07-04 DIAGNOSIS — I69354 Hemiplegia and hemiparesis following cerebral infarction affecting left non-dominant side: Secondary | ICD-10-CM | POA: Diagnosis not present

## 2022-07-04 DIAGNOSIS — I129 Hypertensive chronic kidney disease with stage 1 through stage 4 chronic kidney disease, or unspecified chronic kidney disease: Secondary | ICD-10-CM | POA: Diagnosis not present

## 2022-07-07 DIAGNOSIS — G2581 Restless legs syndrome: Secondary | ICD-10-CM | POA: Diagnosis not present

## 2022-07-07 DIAGNOSIS — F0150 Vascular dementia without behavioral disturbance: Secondary | ICD-10-CM | POA: Diagnosis not present

## 2022-07-07 DIAGNOSIS — G309 Alzheimer's disease, unspecified: Secondary | ICD-10-CM | POA: Diagnosis not present

## 2022-07-07 DIAGNOSIS — F0280 Dementia in other diseases classified elsewhere without behavioral disturbance: Secondary | ICD-10-CM | POA: Diagnosis not present

## 2022-07-07 DIAGNOSIS — N182 Chronic kidney disease, stage 2 (mild): Secondary | ICD-10-CM | POA: Diagnosis not present

## 2022-07-07 DIAGNOSIS — I482 Chronic atrial fibrillation, unspecified: Secondary | ICD-10-CM | POA: Diagnosis not present

## 2022-07-07 DIAGNOSIS — I69354 Hemiplegia and hemiparesis following cerebral infarction affecting left non-dominant side: Secondary | ICD-10-CM | POA: Diagnosis not present

## 2022-07-07 DIAGNOSIS — I129 Hypertensive chronic kidney disease with stage 1 through stage 4 chronic kidney disease, or unspecified chronic kidney disease: Secondary | ICD-10-CM | POA: Diagnosis not present

## 2022-07-07 DIAGNOSIS — G8929 Other chronic pain: Secondary | ICD-10-CM | POA: Diagnosis not present

## 2022-07-09 DIAGNOSIS — F0150 Vascular dementia without behavioral disturbance: Secondary | ICD-10-CM | POA: Diagnosis not present

## 2022-07-09 DIAGNOSIS — I69354 Hemiplegia and hemiparesis following cerebral infarction affecting left non-dominant side: Secondary | ICD-10-CM | POA: Diagnosis not present

## 2022-07-09 DIAGNOSIS — F0280 Dementia in other diseases classified elsewhere without behavioral disturbance: Secondary | ICD-10-CM | POA: Diagnosis not present

## 2022-07-09 DIAGNOSIS — G309 Alzheimer's disease, unspecified: Secondary | ICD-10-CM | POA: Diagnosis not present

## 2022-07-15 DIAGNOSIS — G8929 Other chronic pain: Secondary | ICD-10-CM | POA: Diagnosis not present

## 2022-07-15 DIAGNOSIS — F0150 Vascular dementia without behavioral disturbance: Secondary | ICD-10-CM | POA: Diagnosis not present

## 2022-07-15 DIAGNOSIS — N182 Chronic kidney disease, stage 2 (mild): Secondary | ICD-10-CM | POA: Diagnosis not present

## 2022-07-15 DIAGNOSIS — I69354 Hemiplegia and hemiparesis following cerebral infarction affecting left non-dominant side: Secondary | ICD-10-CM | POA: Diagnosis not present

## 2022-07-15 DIAGNOSIS — F0280 Dementia in other diseases classified elsewhere without behavioral disturbance: Secondary | ICD-10-CM | POA: Diagnosis not present

## 2022-07-15 DIAGNOSIS — G2581 Restless legs syndrome: Secondary | ICD-10-CM | POA: Diagnosis not present

## 2022-07-15 DIAGNOSIS — G309 Alzheimer's disease, unspecified: Secondary | ICD-10-CM | POA: Diagnosis not present

## 2022-07-15 DIAGNOSIS — I129 Hypertensive chronic kidney disease with stage 1 through stage 4 chronic kidney disease, or unspecified chronic kidney disease: Secondary | ICD-10-CM | POA: Diagnosis not present

## 2022-07-15 DIAGNOSIS — I482 Chronic atrial fibrillation, unspecified: Secondary | ICD-10-CM | POA: Diagnosis not present

## 2022-07-21 ENCOUNTER — Ambulatory Visit: Payer: Medicare PPO

## 2022-07-22 LAB — CUP PACEART REMOTE DEVICE CHECK
Date Time Interrogation Session: 20240520020534
Implantable Pulse Generator Implant Date: 20230925
Pulse Gen Serial Number: 511014105

## 2022-07-22 NOTE — Progress Notes (Signed)
Carelink Summary Report / Loop Recorder 

## 2022-07-23 DIAGNOSIS — I69354 Hemiplegia and hemiparesis following cerebral infarction affecting left non-dominant side: Secondary | ICD-10-CM | POA: Diagnosis not present

## 2022-07-23 DIAGNOSIS — F0280 Dementia in other diseases classified elsewhere without behavioral disturbance: Secondary | ICD-10-CM | POA: Diagnosis not present

## 2022-07-23 DIAGNOSIS — I129 Hypertensive chronic kidney disease with stage 1 through stage 4 chronic kidney disease, or unspecified chronic kidney disease: Secondary | ICD-10-CM | POA: Diagnosis not present

## 2022-07-23 DIAGNOSIS — G2581 Restless legs syndrome: Secondary | ICD-10-CM | POA: Diagnosis not present

## 2022-07-23 DIAGNOSIS — I482 Chronic atrial fibrillation, unspecified: Secondary | ICD-10-CM | POA: Diagnosis not present

## 2022-07-23 DIAGNOSIS — G309 Alzheimer's disease, unspecified: Secondary | ICD-10-CM | POA: Diagnosis not present

## 2022-07-23 DIAGNOSIS — G8929 Other chronic pain: Secondary | ICD-10-CM | POA: Diagnosis not present

## 2022-07-23 DIAGNOSIS — F0150 Vascular dementia without behavioral disturbance: Secondary | ICD-10-CM | POA: Diagnosis not present

## 2022-07-23 DIAGNOSIS — N182 Chronic kidney disease, stage 2 (mild): Secondary | ICD-10-CM | POA: Diagnosis not present

## 2022-07-29 ENCOUNTER — Ambulatory Visit (INDEPENDENT_AMBULATORY_CARE_PROVIDER_SITE_OTHER): Payer: Medicare PPO

## 2022-07-29 DIAGNOSIS — R55 Syncope and collapse: Secondary | ICD-10-CM | POA: Diagnosis not present

## 2022-08-04 DIAGNOSIS — I129 Hypertensive chronic kidney disease with stage 1 through stage 4 chronic kidney disease, or unspecified chronic kidney disease: Secondary | ICD-10-CM | POA: Diagnosis not present

## 2022-08-04 DIAGNOSIS — G8929 Other chronic pain: Secondary | ICD-10-CM | POA: Diagnosis not present

## 2022-08-04 DIAGNOSIS — F0150 Vascular dementia without behavioral disturbance: Secondary | ICD-10-CM | POA: Diagnosis not present

## 2022-08-04 DIAGNOSIS — G2581 Restless legs syndrome: Secondary | ICD-10-CM | POA: Diagnosis not present

## 2022-08-04 DIAGNOSIS — F0280 Dementia in other diseases classified elsewhere without behavioral disturbance: Secondary | ICD-10-CM | POA: Diagnosis not present

## 2022-08-04 DIAGNOSIS — I482 Chronic atrial fibrillation, unspecified: Secondary | ICD-10-CM | POA: Diagnosis not present

## 2022-08-04 DIAGNOSIS — G309 Alzheimer's disease, unspecified: Secondary | ICD-10-CM | POA: Diagnosis not present

## 2022-08-04 DIAGNOSIS — I69354 Hemiplegia and hemiparesis following cerebral infarction affecting left non-dominant side: Secondary | ICD-10-CM | POA: Diagnosis not present

## 2022-08-04 DIAGNOSIS — N182 Chronic kidney disease, stage 2 (mild): Secondary | ICD-10-CM | POA: Diagnosis not present

## 2022-08-13 DIAGNOSIS — I69354 Hemiplegia and hemiparesis following cerebral infarction affecting left non-dominant side: Secondary | ICD-10-CM | POA: Diagnosis not present

## 2022-08-13 DIAGNOSIS — G2581 Restless legs syndrome: Secondary | ICD-10-CM | POA: Diagnosis not present

## 2022-08-13 DIAGNOSIS — F0150 Vascular dementia without behavioral disturbance: Secondary | ICD-10-CM | POA: Diagnosis not present

## 2022-08-13 DIAGNOSIS — I482 Chronic atrial fibrillation, unspecified: Secondary | ICD-10-CM | POA: Diagnosis not present

## 2022-08-13 DIAGNOSIS — F0280 Dementia in other diseases classified elsewhere without behavioral disturbance: Secondary | ICD-10-CM | POA: Diagnosis not present

## 2022-08-13 DIAGNOSIS — G309 Alzheimer's disease, unspecified: Secondary | ICD-10-CM | POA: Diagnosis not present

## 2022-08-13 DIAGNOSIS — I129 Hypertensive chronic kidney disease with stage 1 through stage 4 chronic kidney disease, or unspecified chronic kidney disease: Secondary | ICD-10-CM | POA: Diagnosis not present

## 2022-08-13 DIAGNOSIS — G8929 Other chronic pain: Secondary | ICD-10-CM | POA: Diagnosis not present

## 2022-08-13 DIAGNOSIS — N182 Chronic kidney disease, stage 2 (mild): Secondary | ICD-10-CM | POA: Diagnosis not present

## 2022-08-20 DIAGNOSIS — N182 Chronic kidney disease, stage 2 (mild): Secondary | ICD-10-CM | POA: Diagnosis not present

## 2022-08-20 DIAGNOSIS — I69354 Hemiplegia and hemiparesis following cerebral infarction affecting left non-dominant side: Secondary | ICD-10-CM | POA: Diagnosis not present

## 2022-08-20 DIAGNOSIS — I482 Chronic atrial fibrillation, unspecified: Secondary | ICD-10-CM | POA: Diagnosis not present

## 2022-08-20 DIAGNOSIS — G309 Alzheimer's disease, unspecified: Secondary | ICD-10-CM | POA: Diagnosis not present

## 2022-08-20 DIAGNOSIS — F0280 Dementia in other diseases classified elsewhere without behavioral disturbance: Secondary | ICD-10-CM | POA: Diagnosis not present

## 2022-08-20 DIAGNOSIS — G8929 Other chronic pain: Secondary | ICD-10-CM | POA: Diagnosis not present

## 2022-08-20 DIAGNOSIS — I129 Hypertensive chronic kidney disease with stage 1 through stage 4 chronic kidney disease, or unspecified chronic kidney disease: Secondary | ICD-10-CM | POA: Diagnosis not present

## 2022-08-20 DIAGNOSIS — F0150 Vascular dementia without behavioral disturbance: Secondary | ICD-10-CM | POA: Diagnosis not present

## 2022-08-20 DIAGNOSIS — G2581 Restless legs syndrome: Secondary | ICD-10-CM | POA: Diagnosis not present

## 2022-08-21 ENCOUNTER — Ambulatory Visit (INDEPENDENT_AMBULATORY_CARE_PROVIDER_SITE_OTHER): Payer: Medicare PPO

## 2022-08-21 DIAGNOSIS — R55 Syncope and collapse: Secondary | ICD-10-CM

## 2022-08-21 LAB — CUP PACEART REMOTE DEVICE CHECK
Date Time Interrogation Session: 20240620021921
Implantable Pulse Generator Implant Date: 20230925
Pulse Gen Serial Number: 511014105

## 2022-08-21 NOTE — Progress Notes (Signed)
Merlin Loop Recorder 

## 2022-08-25 ENCOUNTER — Ambulatory Visit: Payer: Medicare PPO

## 2022-08-25 DIAGNOSIS — G309 Alzheimer's disease, unspecified: Secondary | ICD-10-CM | POA: Diagnosis not present

## 2022-08-25 DIAGNOSIS — I69354 Hemiplegia and hemiparesis following cerebral infarction affecting left non-dominant side: Secondary | ICD-10-CM | POA: Diagnosis not present

## 2022-08-25 DIAGNOSIS — I482 Chronic atrial fibrillation, unspecified: Secondary | ICD-10-CM | POA: Diagnosis not present

## 2022-08-25 DIAGNOSIS — N182 Chronic kidney disease, stage 2 (mild): Secondary | ICD-10-CM | POA: Diagnosis not present

## 2022-08-25 DIAGNOSIS — G8929 Other chronic pain: Secondary | ICD-10-CM | POA: Diagnosis not present

## 2022-08-25 DIAGNOSIS — F0280 Dementia in other diseases classified elsewhere without behavioral disturbance: Secondary | ICD-10-CM | POA: Diagnosis not present

## 2022-08-25 DIAGNOSIS — F0150 Vascular dementia without behavioral disturbance: Secondary | ICD-10-CM | POA: Diagnosis not present

## 2022-08-25 DIAGNOSIS — I129 Hypertensive chronic kidney disease with stage 1 through stage 4 chronic kidney disease, or unspecified chronic kidney disease: Secondary | ICD-10-CM | POA: Diagnosis not present

## 2022-08-25 DIAGNOSIS — G2581 Restless legs syndrome: Secondary | ICD-10-CM | POA: Diagnosis not present

## 2022-09-01 ENCOUNTER — Ambulatory Visit: Payer: Medicare PPO

## 2022-09-10 DIAGNOSIS — D171 Benign lipomatous neoplasm of skin and subcutaneous tissue of trunk: Secondary | ICD-10-CM | POA: Diagnosis not present

## 2022-09-10 DIAGNOSIS — D2372 Other benign neoplasm of skin of left lower limb, including hip: Secondary | ICD-10-CM | POA: Diagnosis not present

## 2022-09-10 DIAGNOSIS — L821 Other seborrheic keratosis: Secondary | ICD-10-CM | POA: Diagnosis not present

## 2022-09-10 DIAGNOSIS — B351 Tinea unguium: Secondary | ICD-10-CM | POA: Diagnosis not present

## 2022-09-10 DIAGNOSIS — D225 Melanocytic nevi of trunk: Secondary | ICD-10-CM | POA: Diagnosis not present

## 2022-09-10 DIAGNOSIS — L570 Actinic keratosis: Secondary | ICD-10-CM | POA: Diagnosis not present

## 2022-09-10 DIAGNOSIS — L814 Other melanin hyperpigmentation: Secondary | ICD-10-CM | POA: Diagnosis not present

## 2022-09-10 NOTE — Progress Notes (Signed)
Carelink Summary Report / Loop Recorder 

## 2022-09-23 ENCOUNTER — Ambulatory Visit (INDEPENDENT_AMBULATORY_CARE_PROVIDER_SITE_OTHER): Payer: Medicare PPO

## 2022-09-23 DIAGNOSIS — E7800 Pure hypercholesterolemia, unspecified: Secondary | ICD-10-CM | POA: Diagnosis not present

## 2022-09-23 DIAGNOSIS — I4891 Unspecified atrial fibrillation: Secondary | ICD-10-CM | POA: Diagnosis not present

## 2022-09-23 DIAGNOSIS — R55 Syncope and collapse: Secondary | ICD-10-CM | POA: Diagnosis not present

## 2022-09-23 DIAGNOSIS — I10 Essential (primary) hypertension: Secondary | ICD-10-CM | POA: Diagnosis not present

## 2022-09-24 LAB — CUP PACEART REMOTE DEVICE CHECK
Date Time Interrogation Session: 20240721020358
Implantable Pulse Generator Implant Date: 20230925
Pulse Gen Serial Number: 511014105

## 2022-09-26 DIAGNOSIS — R55 Syncope and collapse: Secondary | ICD-10-CM

## 2022-09-29 ENCOUNTER — Ambulatory Visit: Payer: Medicare PPO

## 2022-09-30 DIAGNOSIS — Z1211 Encounter for screening for malignant neoplasm of colon: Secondary | ICD-10-CM | POA: Diagnosis not present

## 2022-09-30 DIAGNOSIS — Z1212 Encounter for screening for malignant neoplasm of rectum: Secondary | ICD-10-CM | POA: Diagnosis not present

## 2022-10-06 ENCOUNTER — Ambulatory Visit: Payer: Medicare PPO

## 2022-10-08 NOTE — Progress Notes (Signed)
Carelink Summary Report / Loop Recorder 

## 2022-10-23 LAB — CUP PACEART REMOTE DEVICE CHECK
Date Time Interrogation Session: 20240821020235
Implantable Pulse Generator Implant Date: 20230925
Pulse Gen Serial Number: 511014105

## 2022-10-23 LAB — COLOGUARD: COLOGUARD: NEGATIVE

## 2022-10-27 ENCOUNTER — Ambulatory Visit (INDEPENDENT_AMBULATORY_CARE_PROVIDER_SITE_OTHER): Payer: Medicare PPO

## 2022-10-27 DIAGNOSIS — R55 Syncope and collapse: Secondary | ICD-10-CM | POA: Diagnosis not present

## 2022-10-31 ENCOUNTER — Telehealth: Payer: Self-pay | Admitting: Cardiovascular Disease

## 2022-10-31 MED ORDER — LISINOPRIL 10 MG PO TABS: 10.0000 mg | ORAL_TABLET | Freq: Every evening | ORAL | 3 refills | Status: DC

## 2022-10-31 MED ORDER — RIVAROXABAN 20 MG PO TABS: 20.0000 mg | ORAL_TABLET | Freq: Every day | ORAL | 6 refills | Status: DC

## 2022-10-31 NOTE — Telephone Encounter (Signed)
Returned call to pt DPR to inform medications have been sent to [facility].

## 2022-10-31 NOTE — Telephone Encounter (Signed)
Spoke with wife per DPR she states [facility] is closing down on 9/6 an they are switching to [facility]. She needs new Rx for lisinopril 10 mg and xarelto 20 mg tabs.  Is it ok to send. Med list just say historical provider

## 2022-10-31 NOTE — Telephone Encounter (Signed)
Pt c/o medication issue:  1. Name of Medication:  lisinopril (PRINIVIL,ZESTRIL) 10 MG tablet       rivaroxaban (XARELTO) 20 MG TABS tablet  2. How are you currently taking this medication (dosage and times per day)?   3. Are you having a reaction (difficulty breathing--STAT)?   4. What is your medication issue?   Patient's wife states the patient is switching to [facility] and she needs to have his prescriptions transferred from [facility]. Patient's wife states she knows he needs prescriptions for Lisinopril and Xarelto, but her son threw out the box with other prescriptions and she does not know the names of them. Please clarify and send prescriptions to [facility] if possible.

## 2022-11-04 ENCOUNTER — Ambulatory Visit: Payer: Medicare PPO

## 2022-11-05 NOTE — Progress Notes (Signed)
[employer] Loop Recorder  

## 2022-11-10 ENCOUNTER — Ambulatory Visit: Payer: Medicare PPO

## 2022-11-26 LAB — CUP PACEART REMOTE DEVICE CHECK
Date Time Interrogation Session: 20240921020346
Implantable Pulse Generator Implant Date: 20230925
Pulse Gen Serial Number: 511014105

## 2022-12-01 ENCOUNTER — Ambulatory Visit (INDEPENDENT_AMBULATORY_CARE_PROVIDER_SITE_OTHER): Payer: Medicare PPO

## 2022-12-01 DIAGNOSIS — R55 Syncope and collapse: Secondary | ICD-10-CM | POA: Diagnosis not present

## 2022-12-08 ENCOUNTER — Ambulatory Visit: Payer: Medicare PPO

## 2022-12-13 DIAGNOSIS — D696 Thrombocytopenia, unspecified: Secondary | ICD-10-CM | POA: Diagnosis not present

## 2022-12-13 DIAGNOSIS — D6869 Other thrombophilia: Secondary | ICD-10-CM | POA: Diagnosis not present

## 2022-12-13 DIAGNOSIS — I482 Chronic atrial fibrillation, unspecified: Secondary | ICD-10-CM | POA: Diagnosis not present

## 2022-12-13 DIAGNOSIS — G4733 Obstructive sleep apnea (adult) (pediatric): Secondary | ICD-10-CM | POA: Diagnosis not present

## 2022-12-13 DIAGNOSIS — G300 Alzheimer's disease with early onset: Secondary | ICD-10-CM | POA: Diagnosis not present

## 2022-12-13 DIAGNOSIS — F02A0 Dementia in other diseases classified elsewhere, mild, without behavioral disturbance, psychotic disturbance, mood disturbance, and anxiety: Secondary | ICD-10-CM | POA: Diagnosis not present

## 2022-12-13 DIAGNOSIS — E785 Hyperlipidemia, unspecified: Secondary | ICD-10-CM | POA: Diagnosis not present

## 2022-12-13 DIAGNOSIS — E782 Mixed hyperlipidemia: Secondary | ICD-10-CM | POA: Diagnosis not present

## 2022-12-13 DIAGNOSIS — D638 Anemia in other chronic diseases classified elsewhere: Secondary | ICD-10-CM | POA: Diagnosis not present

## 2022-12-13 DIAGNOSIS — F0280 Dementia in other diseases classified elsewhere without behavioral disturbance: Secondary | ICD-10-CM | POA: Diagnosis not present

## 2022-12-13 DIAGNOSIS — Z8673 Personal history of transient ischemic attack (TIA), and cerebral infarction without residual deficits: Secondary | ICD-10-CM | POA: Diagnosis not present

## 2022-12-13 DIAGNOSIS — G309 Alzheimer's disease, unspecified: Secondary | ICD-10-CM | POA: Diagnosis not present

## 2022-12-13 DIAGNOSIS — R4182 Altered mental status, unspecified: Secondary | ICD-10-CM | POA: Diagnosis not present

## 2022-12-13 DIAGNOSIS — G9389 Other specified disorders of brain: Secondary | ICD-10-CM | POA: Diagnosis not present

## 2022-12-13 DIAGNOSIS — Z043 Encounter for examination and observation following other accident: Secondary | ICD-10-CM | POA: Diagnosis not present

## 2022-12-13 DIAGNOSIS — R946 Abnormal results of thyroid function studies: Secondary | ICD-10-CM | POA: Diagnosis not present

## 2022-12-13 DIAGNOSIS — I517 Cardiomegaly: Secondary | ICD-10-CM | POA: Diagnosis not present

## 2022-12-13 DIAGNOSIS — F319 Bipolar disorder, unspecified: Secondary | ICD-10-CM | POA: Diagnosis not present

## 2022-12-13 DIAGNOSIS — Z96652 Presence of left artificial knee joint: Secondary | ICD-10-CM | POA: Diagnosis not present

## 2022-12-13 DIAGNOSIS — Z87891 Personal history of nicotine dependence: Secondary | ICD-10-CM | POA: Diagnosis not present

## 2022-12-13 DIAGNOSIS — Z8679 Personal history of other diseases of the circulatory system: Secondary | ICD-10-CM | POA: Diagnosis not present

## 2022-12-13 DIAGNOSIS — Z950 Presence of cardiac pacemaker: Secondary | ICD-10-CM | POA: Diagnosis not present

## 2022-12-13 DIAGNOSIS — S7002XA Contusion of left hip, initial encounter: Secondary | ICD-10-CM | POA: Diagnosis not present

## 2022-12-13 DIAGNOSIS — Z7901 Long term (current) use of anticoagulants: Secondary | ICD-10-CM | POA: Diagnosis not present

## 2022-12-13 DIAGNOSIS — Z96641 Presence of right artificial hip joint: Secondary | ICD-10-CM | POA: Diagnosis not present

## 2022-12-13 DIAGNOSIS — Z79899 Other long term (current) drug therapy: Secondary | ICD-10-CM | POA: Diagnosis not present

## 2022-12-13 DIAGNOSIS — T796XXA Traumatic ischemia of muscle, initial encounter: Secondary | ICD-10-CM | POA: Diagnosis not present

## 2022-12-13 DIAGNOSIS — R531 Weakness: Secondary | ICD-10-CM | POA: Diagnosis present

## 2022-12-13 DIAGNOSIS — I10 Essential (primary) hypertension: Secondary | ICD-10-CM | POA: Diagnosis not present

## 2022-12-13 DIAGNOSIS — R0602 Shortness of breath: Secondary | ICD-10-CM | POA: Diagnosis not present

## 2022-12-13 DIAGNOSIS — Y92009 Unspecified place in unspecified non-institutional (private) residence as the place of occurrence of the external cause: Secondary | ICD-10-CM | POA: Diagnosis not present

## 2022-12-13 DIAGNOSIS — W19XXXA Unspecified fall, initial encounter: Secondary | ICD-10-CM | POA: Diagnosis not present

## 2022-12-13 DIAGNOSIS — Y9289 Other specified places as the place of occurrence of the external cause: Secondary | ICD-10-CM | POA: Diagnosis not present

## 2022-12-13 DIAGNOSIS — N179 Acute kidney failure, unspecified: Secondary | ICD-10-CM | POA: Diagnosis not present

## 2022-12-13 DIAGNOSIS — Z91040 Latex allergy status: Secondary | ICD-10-CM | POA: Diagnosis not present

## 2022-12-13 DIAGNOSIS — G9340 Encephalopathy, unspecified: Secondary | ICD-10-CM | POA: Diagnosis not present

## 2022-12-14 DIAGNOSIS — Z96641 Presence of right artificial hip joint: Secondary | ICD-10-CM | POA: Diagnosis not present

## 2022-12-14 DIAGNOSIS — I517 Cardiomegaly: Secondary | ICD-10-CM | POA: Diagnosis not present

## 2022-12-14 DIAGNOSIS — Z043 Encounter for examination and observation following other accident: Secondary | ICD-10-CM | POA: Diagnosis not present

## 2022-12-15 ENCOUNTER — Ambulatory Visit: Payer: Medicare PPO

## 2022-12-15 NOTE — Progress Notes (Signed)
Merlin Loop [employer]

## 2022-12-16 ENCOUNTER — Other Ambulatory Visit (HOSPITAL_COMMUNITY): Payer: Self-pay

## 2022-12-16 DIAGNOSIS — R0602 Shortness of breath: Secondary | ICD-10-CM | POA: Diagnosis not present

## 2022-12-16 DIAGNOSIS — I517 Cardiomegaly: Secondary | ICD-10-CM | POA: Diagnosis not present

## 2022-12-17 ENCOUNTER — Other Ambulatory Visit: Payer: Self-pay

## 2022-12-17 ENCOUNTER — Encounter (HOSPITAL_COMMUNITY): Payer: Self-pay | Admitting: Internal Medicine

## 2022-12-17 ENCOUNTER — Observation Stay (HOSPITAL_COMMUNITY)
Admission: EM | Admit: 2022-12-17 | Discharge: 2022-12-19 | Disposition: A | Discharge status: Skilled Nursing Facility | Payer: Medicare PPO | Attending: Internal Medicine | Admitting: Internal Medicine

## 2022-12-17 ENCOUNTER — Inpatient Hospital Stay (HOSPITAL_COMMUNITY): Payer: Medicare PPO

## 2022-12-17 DIAGNOSIS — Z8673 Personal history of transient ischemic attack (TIA), and cerebral infarction without residual deficits: Secondary | ICD-10-CM | POA: Diagnosis not present

## 2022-12-17 DIAGNOSIS — R946 Abnormal results of thyroid function studies: Secondary | ICD-10-CM | POA: Diagnosis not present

## 2022-12-17 DIAGNOSIS — Z7901 Long term (current) use of anticoagulants: Secondary | ICD-10-CM | POA: Insufficient documentation

## 2022-12-17 DIAGNOSIS — W19XXXA Unspecified fall, initial encounter: Secondary | ICD-10-CM | POA: Diagnosis not present

## 2022-12-17 DIAGNOSIS — Y92009 Unspecified place in unspecified non-institutional (private) residence as the place of occurrence of the external cause: Secondary | ICD-10-CM

## 2022-12-17 DIAGNOSIS — Y9289 Other specified places as the place of occurrence of the external cause: Secondary | ICD-10-CM | POA: Diagnosis not present

## 2022-12-17 DIAGNOSIS — D696 Thrombocytopenia, unspecified: Secondary | ICD-10-CM | POA: Insufficient documentation

## 2022-12-17 DIAGNOSIS — R531 Weakness: Principal | ICD-10-CM

## 2022-12-17 DIAGNOSIS — Z950 Presence of cardiac pacemaker: Secondary | ICD-10-CM | POA: Diagnosis not present

## 2022-12-17 DIAGNOSIS — I10 Essential (primary) hypertension: Secondary | ICD-10-CM | POA: Diagnosis present

## 2022-12-17 DIAGNOSIS — G309 Alzheimer's disease, unspecified: Secondary | ICD-10-CM | POA: Insufficient documentation

## 2022-12-17 DIAGNOSIS — F0280 Dementia in other diseases classified elsewhere without behavioral disturbance: Secondary | ICD-10-CM | POA: Diagnosis not present

## 2022-12-17 DIAGNOSIS — Z96641 Presence of right artificial hip joint: Secondary | ICD-10-CM | POA: Diagnosis not present

## 2022-12-17 DIAGNOSIS — I482 Chronic atrial fibrillation, unspecified: Secondary | ICD-10-CM | POA: Diagnosis present

## 2022-12-17 DIAGNOSIS — D6869 Other thrombophilia: Secondary | ICD-10-CM | POA: Insufficient documentation

## 2022-12-17 DIAGNOSIS — D638 Anemia in other chronic diseases classified elsewhere: Secondary | ICD-10-CM | POA: Diagnosis not present

## 2022-12-17 DIAGNOSIS — Z91040 Latex allergy status: Secondary | ICD-10-CM | POA: Insufficient documentation

## 2022-12-17 DIAGNOSIS — F319 Bipolar disorder, unspecified: Secondary | ICD-10-CM | POA: Diagnosis present

## 2022-12-17 DIAGNOSIS — E785 Hyperlipidemia, unspecified: Secondary | ICD-10-CM | POA: Diagnosis present

## 2022-12-17 DIAGNOSIS — Z87891 Personal history of nicotine dependence: Secondary | ICD-10-CM | POA: Diagnosis not present

## 2022-12-17 DIAGNOSIS — G4733 Obstructive sleep apnea (adult) (pediatric): Secondary | ICD-10-CM | POA: Diagnosis present

## 2022-12-17 DIAGNOSIS — Z96652 Presence of left artificial knee joint: Secondary | ICD-10-CM | POA: Insufficient documentation

## 2022-12-17 DIAGNOSIS — E782 Mixed hyperlipidemia: Secondary | ICD-10-CM

## 2022-12-17 DIAGNOSIS — Z79899 Other long term (current) drug therapy: Secondary | ICD-10-CM | POA: Diagnosis not present

## 2022-12-17 LAB — COMPREHENSIVE METABOLIC PANEL
ALT: 34 U/L (ref 0–44)
AST: 33 U/L (ref 15–41)
Albumin: 3.6 g/dL (ref 3.5–5.0)
Alkaline Phosphatase: 87 U/L (ref 38–126)
Anion gap: 12 (ref 5–15)
BUN: 19 mg/dL (ref 8–23)
CO2: 25 mmol/L (ref 22–32)
Calcium: 9.4 mg/dL (ref 8.9–10.3)
Chloride: 104 mmol/L (ref 98–111)
Creatinine, Ser: 1.04 mg/dL (ref 0.61–1.24)
GFR, Estimated: >60 mL/min (ref >60)
Glucose, Bld: 107 mg/dL — ABNORMAL HIGH (ref 70–99)
Potassium: 3.7 mmol/L (ref 3.5–5.1)
Sodium: 141 mmol/L (ref 135–145)
Total Bilirubin: 0.9 mg/dL (ref 0.3–1.2)
Total Protein: 7 g/dL (ref 6.5–8.1)

## 2022-12-17 LAB — CBC WITH DIFFERENTIAL/PLATELET
Abs Immature Granulocytes: 0.02 10*3/uL (ref 0.00–0.07)
Basophils Absolute: 0 10*3/uL (ref 0.0–0.1)
Basophils Relative: 1 %
Eosinophils Absolute: 0.2 10*3/uL (ref 0.0–0.5)
Eosinophils Relative: 4 %
HCT: 37.9 % — ABNORMAL LOW (ref 39.0–52.0)
Hemoglobin: 12.1 g/dL — ABNORMAL LOW (ref 13.0–17.0)
Immature Granulocytes: 0 %
Lymphocytes Relative: 14 %
Lymphs Abs: 0.7 10*3/uL (ref 0.7–4.0)
MCH: 31.1 pg (ref 26.0–34.0)
MCHC: 31.9 g/dL (ref 30.0–36.0)
MCV: 97.4 fL (ref 80.0–100.0)
Monocytes Absolute: 0.3 10*3/uL (ref 0.1–1.0)
Monocytes Relative: 5 %
Neutro Abs: 4.2 10*3/uL (ref 1.7–7.7)
Neutrophils Relative %: 76 %
Platelets: 105 10*3/uL — ABNORMAL LOW (ref 150–400)
RBC: 3.89 MIL/uL — ABNORMAL LOW (ref 4.22–5.81)
RDW: 13.7 % (ref 11.5–15.5)
WBC: 5.5 10*3/uL (ref 4.0–10.5)
nRBC: 0 % (ref 0.0–0.2)

## 2022-12-17 LAB — URINALYSIS, ROUTINE W REFLEX MICROSCOPIC
Bilirubin Urine: NEGATIVE
Glucose, UA: NEGATIVE mg/dL
Hgb urine dipstick: NEGATIVE
Ketones, ur: 20 mg/dL — AB
Leukocytes,Ua: NEGATIVE
Nitrite: NEGATIVE
Protein, ur: 30 mg/dL — AB
Specific Gravity, Urine: 1.028 (ref 1.005–1.030)
pH: 5 (ref 5.0–8.0)

## 2022-12-17 LAB — CK: Total CK: 339 U/L (ref 49–397)

## 2022-12-17 LAB — CBG MONITORING, ED: Glucose-Capillary: 92 mg/dL (ref 70–99)

## 2022-12-17 LAB — MAGNESIUM: Magnesium: 2.1 mg/dL (ref 1.7–2.4)

## 2022-12-17 LAB — TSH: TSH: 5.182 u[IU]/mL — ABNORMAL HIGH (ref 0.350–4.500)

## 2022-12-17 LAB — VITAMIN B12: Vitamin B-12: 945 pg/mL — ABNORMAL HIGH (ref 180–914)

## 2022-12-17 LAB — C-REACTIVE PROTEIN: CRP: 12.3 mg/dL — ABNORMAL HIGH (ref <1.0)

## 2022-12-17 MED ORDER — MELATONIN 3 MG PO TABS: 3.0000 mg | ORAL_TABLET | Freq: Every evening | ORAL | Status: DC | PRN

## 2022-12-17 MED ORDER — LORAZEPAM 2 MG/ML IJ SOLN
1.0000 mg | Freq: Once | INTRAMUSCULAR | Status: AC | PRN
  Administered 2022-12-17: 1 mg via INTRAVENOUS
  Filled 2022-12-17: qty 1

## 2022-12-17 MED ORDER — ACETAMINOPHEN 650 MG RE SUPP: 650.0000 mg | Freq: Four times a day (QID) | RECTAL | Status: DC | PRN

## 2022-12-17 MED ORDER — DIVALPROEX SODIUM 250 MG PO DR TAB
250.0000 mg | DELAYED_RELEASE_TABLET | Freq: Every evening | ORAL | Status: DC
  Administered 2022-12-17 – 2022-12-18 (×2): 250 mg via ORAL
  Filled 2022-12-17 (×2): qty 1

## 2022-12-17 MED ORDER — INFLUENZA VAC A&B SURF ANT ADJ 0.5 ML IM SUSY
0.5000 mL | PREFILLED_SYRINGE | INTRAMUSCULAR | Status: DC
  Filled 2022-12-17: qty 0.5

## 2022-12-17 MED ORDER — ACETAMINOPHEN 325 MG PO TABS
650.0000 mg | ORAL_TABLET | Freq: Four times a day (QID) | ORAL | Status: DC | PRN
  Administered 2022-12-18: 650 mg via ORAL
  Filled 2022-12-17: qty 2

## 2022-12-17 MED ORDER — LACTATED RINGERS IV BOLUS
500.0000 mL | Freq: Once | INTRAVENOUS | Status: AC
  Administered 2022-12-17: 500 mL via INTRAVENOUS

## 2022-12-17 MED ORDER — ATORVASTATIN CALCIUM 40 MG PO TABS
40.0000 mg | ORAL_TABLET | Freq: Every evening | ORAL | Status: DC
  Administered 2022-12-17 – 2022-12-18 (×2): 40 mg via ORAL
  Filled 2022-12-17 (×2): qty 1

## 2022-12-17 MED ORDER — ONDANSETRON HCL 4 MG/2ML IJ SOLN: 4.0000 mg | Freq: Four times a day (QID) | INTRAMUSCULAR | Status: DC | PRN

## 2022-12-17 MED ORDER — CARBIDOPA-LEVODOPA 25-250 MG PO TABS
1.0000 | ORAL_TABLET | Freq: Two times a day (BID) | ORAL | Status: DC
  Administered 2022-12-18 – 2022-12-19 (×4): 1 via ORAL
  Filled 2022-12-17 (×6): qty 1

## 2022-12-17 MED ORDER — LISINOPRIL 10 MG PO TABS
10.0000 mg | ORAL_TABLET | Freq: Every evening | ORAL | Status: DC
  Administered 2022-12-17 – 2022-12-18 (×2): 10 mg via ORAL
  Filled 2022-12-17 (×2): qty 1

## 2022-12-17 MED ORDER — MEMANTINE HCL 10 MG PO TABS
10.0000 mg | ORAL_TABLET | Freq: Two times a day (BID) | ORAL | Status: DC
  Administered 2022-12-17 – 2022-12-19 (×4): 10 mg via ORAL
  Filled 2022-12-17 (×4): qty 1

## 2022-12-17 MED ORDER — RIVAROXABAN 20 MG PO TABS
20.0000 mg | ORAL_TABLET | Freq: Every day | ORAL | Status: DC
  Administered 2022-12-18 – 2022-12-19 (×2): 20 mg via ORAL
  Filled 2022-12-17 (×2): qty 1

## 2022-12-17 MED ORDER — LAMOTRIGINE 25 MG PO TABS
150.0000 mg | ORAL_TABLET | Freq: Two times a day (BID) | ORAL | Status: DC
  Administered 2022-12-17 – 2022-12-19 (×4): 150 mg via ORAL
  Filled 2022-12-17 (×4): qty 2

## 2022-12-17 MED ORDER — DONEPEZIL HCL 10 MG PO TABS
10.0000 mg | ORAL_TABLET | Freq: Every day | ORAL | Status: DC
  Administered 2022-12-17 – 2022-12-18 (×2): 10 mg via ORAL
  Filled 2022-12-17 (×2): qty 1

## 2022-12-17 NOTE — Progress Notes (Signed)
Patient was brought to MRI w/o any family present, multiple attempts were made to reach spouse, was able to contact daughter.  The daughter did not want to answer any questions related to patient's implants or history at this time.  She stated that the MRI can wait until morning due to them having a long day of traveling from [place] here.  Sent patient back to room, will f/u on dayshift tomorrow.

## 2022-12-17 NOTE — H&P (Signed)
History and Physical      [name] WGN:[id number] DOB: 1946-02-02 DOA: 12/17/2022; DOS: 12/17/2022  PCP: Merri Brunette, MD *** Patient coming from: home ***  I have personally briefly reviewed patient's old medical records in [facility]  Chief Complaint: ***  HPI: [name] [name] is a 77 y.o. male with medical history significant for *** who is admitted to [facility] on 12/17/2022 with *** after presenting from home*** to [facility] ED complaining of ***.    ***       ***   ED Course:  Vital signs in the ED were notable for the following: ***  Labs were notable for the following: ***  Per my interpretation, EKG in ED demonstrated the following:  ***  Imaging in the ED, per corresponding formal radiology read, was notable for the following:  ***  While in the ED, the following were administered: ***  Subsequently, the patient was admitted  ***  ***red    Review of Systems: As per HPI otherwise 10 point review of systems negative.   Past Medical History:  Diagnosis Date   Alzheimer disease (HCC)    Anxiety    Arthritis    Atrial fibrillation (HCC)    Chronic   Bipolar disorder (HCC)    Chronic low back pain    Dementia (HCC)    Depression    Dizziness    Dysrhythmia    a fib   ED (erectile dysfunction)    External hemorrhoids    Fatigue    HLD (hyperlipidemia)    HTN (hypertension)    Hypogonadism in male    Lipoma of skin    Memory loss    Mixed Alzheimer's and vascular dementia (HCC)    Poor balance    Restless legs syndrome (RLS)    Sleep apnea    cpap   Stroke (HCC) 1991   Tremor     Past Surgical History:  Procedure Laterality Date   COLONOSCOPY     KNEE ARTHROPLASTY Left 06/04/2017   Procedure: LEFT TOTAL KNEE ARTHROPLASTY WITH COMPUTER NAVIGATION;  Surgeon: Samson Frederic, MD;  Location: [facility];  Service: Orthopedics;  Laterality: Left;  NEEDS RNFA   LOOP RECORDER INSERTION N/A 11/25/2021   Procedure: LOOP RECORDER  INSERTION;  Surgeon: Duke Salvia, MD;  Location: [facility] INVASIVE CV LAB;  Service: Cardiovascular;  Laterality: N/A;   TONSILLECTOMY AND ADENOIDECTOMY     TOTAL HIP ARTHROPLASTY Right 07/31/2016   Procedure: RIGHT TOTAL HIP ARTHROPLASTY ANTERIOR APPROACH;  Surgeon: Samson Frederic, MD;  Location: [facility];  Service: Orthopedics;  Laterality: Right;  Requesting RNFA    Social History:  reports that he has never smoked. He quit smokeless tobacco use about 54 years ago. He reports current alcohol use of about 1.0 standard drink of alcohol per week. He reports that he does not use drugs.   Allergies  Allergen Reactions   Latex Rash and Other (See Comments)    Reaction to knee wraps    Family History  Problem Relation Age of Onset   Suicidality Unknown    Cancer Unknown    Alcohol abuse Unknown    Cancer Mother    Suicidality Father     Family history reviewed and not pertinent ***   Prior to Admission medications   Medication Sig Start Date End Date Taking? Authorizing Provider  atorvastatin (LIPITOR) 40 MG tablet Take 40 mg by mouth every evening. 11/27/15  Yes [provider]  Azelaic  Acid 15 % gel Apply 1 Application topically 2 (two) times daily.   Yes [provider]  b complex vitamins tablet Take 1 tablet by mouth daily.   Yes [provider]  carbidopa-levodopa (SINEMET IR) 25-250 MG per tablet Take 1 tablet by mouth 2 (two) times daily.    Yes [provider]  divalproex (DEPAKOTE) 250 MG DR tablet Take 1 tablet by mouth every evening. 10/28/19  Yes [provider]  donepezil (ARICEPT) 10 MG tablet Take 1 tablet (10 mg total) by mouth at bedtime. 08/08/14  Yes Jaffe, Adam R, DO  Evolocumab (REPATHA SURECLICK) 140 MG/ML SOAJ Inject 140 mg into the skin every 14 (fourteen) days.   Yes [provider]  folic acid (FOLVITE) 1 MG tablet Take 1 mg by mouth at bedtime.    Yes [provider]  lamoTRIgine (LAMICTAL) 150 MG  tablet Take 150 mg by mouth 2 (two) times daily.   Yes [provider]  lisinopril (ZESTRIL) 10 MG tablet Take 1 tablet (10 mg total) by mouth every evening. 10/31/22  Yes Runell Gess, MD  memantine (NAMENDA) 10 MG tablet Take 10 mg by mouth 2 (two) times daily.    Yes [provider]  mirtazapine (REMERON) 15 MG tablet Take 15 mg by mouth at bedtime.   Yes [provider]  rivaroxaban (XARELTO) 20 MG TABS tablet Take 1 tablet (20 mg total) by mouth daily. 10/31/22  Yes Runell Gess, MD  tamsulosin (FLOMAX) 0.4 MG CAPS capsule Take 0.4 mg by mouth daily. 12/14/22 12/17/22 Yes [provider]     Objective    Physical Exam: Vitals:   12/17/22 1709  BP: (!) 142/83  Pulse: 86  Resp: 16  Temp: 97.6 F (36.4 C)  TempSrc: Oral  SpO2: 97%    General: appears to be stated age; alert, oriented Skin: warm, dry, no rash Head:  AT/[place] Mouth:  Oral mucosa membranes appear moist, normal dentition Neck: supple; trachea midline Heart:  RRR; did not appreciate any M/R/G Lungs: CTAB, did not appreciate any wheezes, rales, or rhonchi Abdomen: + BS; soft, ND, NT Vascular: 2+ pedal pulses b/l; 2+ radial pulses b/l Extremities: no peripheral edema, no muscle wasting Neuro: strength and sensation intact in upper and lower extremities b/l ***   *** Neuro: 5/5 strength of the proximal and distal flexors and extensors of the upper and lower extremities bilaterally; sensation intact in upper and lower extremities b/l; cranial nerves II through XII grossly intact; no pronator drift; no evidence suggestive of slurred speech, dysarthria, or facial droop; Normal muscle tone. No tremors.  *** Neuro: In the setting of the patient's current mental status and associated inability to follow instructions, unable to perform full neurologic exam at this time.  As such, assessment of strength, sensation, and cranial nerves is limited at this time. Patient noted to  spontaneously move all 4 extremities. No tremors.  ***    Labs on Admission: I have personally reviewed following labs and imaging studies  CBC: Recent Labs  Lab 12/17/22 1725  WBC 5.5  NEUTROABS 4.2  HGB 12.1*  HCT 37.9*  MCV 97.4  PLT 105*   Basic Metabolic Panel: Recent Labs  Lab 12/17/22 1725  NA 141  K 3.7  CL 104  CO2 25  GLUCOSE 107*  BUN 19  CREATININE 1.04  CALCIUM 9.4   GFR: CrCl cannot be calculated (Unknown ideal weight.). Liver Function Tests: Recent Labs  Lab 12/17/22 1725  AST 33  ALT 34  ALKPHOS 87  BILITOT 0.9  PROT 7.0  ALBUMIN 3.6   No results for input(s): "LIPASE", "AMYLASE" in the last 168 hours. No results for input(s): "AMMONIA" in the last 168 hours. Coagulation Profile: No results for input(s): "INR", "PROTIME" in the last 168 hours. Cardiac Enzymes: Recent Labs  Lab 12/17/22 1725  CKTOTAL 339   BNP (last 3 results) No results for input(s): "PROBNP" in the last 8760 hours. HbA1C: No results for input(s): "HGBA1C" in the last 72 hours. CBG: Recent Labs  Lab 12/17/22 1731  GLUCAP 92   Lipid Profile: No results for input(s): "CHOL", "HDL", "LDLCALC", "TRIG", "CHOLHDL", "LDLDIRECT" in the last 72 hours. Thyroid Function Tests: No results for input(s): "TSH", "T4TOTAL", "FREET4", "T3FREE", "THYROIDAB" in the last 72 hours. Anemia Panel: No results for input(s): "VITAMINB12", "FOLATE", "FERRITIN", "TIBC", "IRON", "RETICCTPCT" in the last 72 hours. Urine analysis:    Component Value Date/Time   COLORURINE YELLOW 06/25/2022 2330   APPEARANCEUR CLEAR 06/25/2022 2330   LABSPEC 1.025 06/25/2022 2330   PHURINE 5.0 06/25/2022 2330   GLUCOSEU NEGATIVE 06/25/2022 2330   HGBUR NEGATIVE 06/25/2022 2330   BILIRUBINUR NEGATIVE 06/25/2022 2330   KETONESUR NEGATIVE 06/25/2022 2330   PROTEINUR NEGATIVE 06/25/2022 2330   UROBILINOGEN 0.2 03/17/2013 2048   NITRITE NEGATIVE 06/25/2022 2330   LEUKOCYTESUR NEGATIVE 06/25/2022 2330     Radiological Exams on Admission: No results found.    Assessment/Plan   Principal Problem:   Generalized weakness   ***            ***                ***                 ***               ***               ***               ***                ***               ***               ***               ***               ***              ***          ***  DVT prophylaxis: SCD's ***  Code Status: Full code*** Family Communication: none*** Disposition Plan: Per Rounding Team Consults called: none***;  Admission status: ***     I SPENT GREATER THAN 75 *** MINUTES IN CLINICAL CARE TIME/MEDICAL DECISION-MAKING IN COMPLETING THIS ADMISSION.      Chaney Born Kamarri Fischetti DO [facility]  From 7PM - 7AM   12/17/2022, 8:15 PM   ***

## 2022-12-17 NOTE — ED Triage Notes (Signed)
PT arrives via POV with his wife. Pt fell out of his bed on Friday night while they were out of town, wife didn't hear him fall. Pt had difficulty walking the next day. Pt was seen and admitted to an ICU in [place] for rhabdomyolysis and released this morning. Wife states he has improved but is still having difficulty walking and trouble taking his medications. Pt does have dementia. He is on xarelto.

## 2022-12-17 NOTE — ED Provider Notes (Signed)
[name] [name]   CSN: [id number] Arrival date & time: 12/17/22  1659     History {Add pertinent medical, surgical, social history, OB history to HPI:1} Chief Complaint  Patient presents with   Fall   Weakness    [name] [name] is a 77 y.o. male.   Fall  Weakness 77 year old male history of Alzheimer disease, atrial fibrillation on Xarelto, hypertension, hyperlipidemia presenting for recent fall.  Patient's wife is here.  Wife states she tried to take patient to a college reunion in [place].  On Friday patient fell out of his bed in the hotel and was stuck on the floor for about 12 hours.  He was seen in the ED and a small hospital there called [facility] in [place].  He had a CT head which showed a frontal lobe hypodensity without prior to compare to.  CK was also elevated to about 1900.  He was admitted to the hospital over the weekend.  Reportedly failed multiple swallow test there and was n.p.o. and unable to eat which is new for him.  He is more confused there as well.  He received IV fluids.  Unclear exactly what transpired this morning, however wife stated she want to get the patient back to [place] where they live as they were staying in a hotel there.  She stated that they requested to be discharged this morning so they could drive here and come to the hospital.  The other hospital knew the plan was to come to the ED here for evaluation.  She states patient has not been able to walk since his fall on Friday, has not been able to swallow or eat which are both new for him.  He was more confused this morning but is now back to his baseline consistent with his dementia.  Is not any new falls or fevers or any other change in behavior during the day today.  Patient denies any symptoms including no pain, shortness of breath, chest pain.     Home Medications Prior to Admission medications   Medication Sig  Start Date End Date Taking? Authorizing Provider  atorvastatin (LIPITOR) 40 MG tablet Take 40 mg by mouth every evening. 11/27/15   [provider]  b complex vitamins tablet Take 1 tablet by mouth daily.    [provider]  carbidopa-levodopa (SINEMET IR) 25-250 MG per tablet Take 1 tablet by mouth 2 (two) times daily.     [provider]  divalproex (DEPAKOTE) 250 MG DR tablet Take 1 tablet by mouth every evening. 10/28/19   [provider]  docusate sodium (COLACE) 100 MG capsule Take 1 capsule (100 mg total) by mouth 2 (two) times daily. 06/05/17   Swinteck, Arlys John, MD  donepezil (ARICEPT) 10 MG tablet Take 1 tablet (10 mg total) by mouth at bedtime. 08/08/14   Everlena Cooper, Adam R, DO  Evolocumab (REPATHA SURECLICK) 140 MG/ML SOAJ Inject 140 mg into the skin every 14 (fourteen) days.    [provider]  fluocinonide gel (LIDEX) 0.05 % Apply 1 application topically 2 (two) times daily.    [provider]  folic acid (FOLVITE) 1 MG tablet Take 1 mg by mouth at bedtime.     [provider]  gabapentin (NEURONTIN) 300 MG capsule Take 1 capsule by mouth 2 (two) times daily. 10/28/19   [provider]  lamoTRIgine (LAMICTAL) 150 MG tablet Take 150 mg by mouth 2 (two) times  daily.    [provider]  lisinopril (ZESTRIL) 10 MG tablet Take 1 tablet (10 mg total) by mouth every evening. 10/31/22   Runell Gess, MD  memantine (NAMENDA) 10 MG tablet Take 10 mg by mouth 2 (two) times daily.     [provider]  metroNIDAZOLE (METROGEL) 0.75 % gel Apply 1 application topically 2 (two) times daily.    [provider]  rivaroxaban (XARELTO) 20 MG TABS tablet Take 1 tablet (20 mg total) by mouth daily. 10/31/22   Runell Gess, MD      Allergies    Latex    Review of Systems   Review of Systems  Neurological:  Positive for weakness.  Review of systems completed and notable as per HPI.  ROS otherwise  negative.   Physical Exam Updated Vital Signs BP (!) 142/83   Pulse 86   Temp 97.6 F (36.4 C) (Oral)   Resp 16   SpO2 97%  Physical Exam Vitals and nursing [name] reviewed.  Constitutional:      General: He is not in acute distress.    Appearance: He is well-developed.  HENT:     Head: Normocephalic and atraumatic.     Nose: Nose normal.     Mouth/Throat:     Pharynx: Oropharynx is clear.  Eyes:     Extraocular Movements: Extraocular movements intact.     Conjunctiva/sclera: Conjunctivae normal.     Pupils: Pupils are equal, round, and reactive to light.  Cardiovascular:     Rate and Rhythm: Normal rate and regular rhythm.     Pulses: Normal pulses.     Heart sounds: Normal heart sounds. No murmur heard. Pulmonary:     Effort: Pulmonary effort is normal. No respiratory distress.     Breath sounds: Normal breath sounds.  Abdominal:     Palpations: Abdomen is soft.     Tenderness: There is no abdominal tenderness.  Musculoskeletal:        General: No swelling.     Cervical back: Normal range of motion and neck supple. No rigidity or tenderness.     Right lower leg: No edema.     Left lower leg: No edema.  Skin:    General: Skin is warm and dry.     Capillary Refill: Capillary refill takes less than 2 seconds.  Neurological:     General: No focal deficit present.     Mental Status: He is alert. Mental status is at baseline.     Cranial Nerves: No cranial nerve deficit.     Motor: No weakness.  Psychiatric:        Mood and Affect: Mood normal.     ED Results / Procedures / Treatments   Labs (all labs ordered are listed, but only abnormal results are displayed) Labs Reviewed  COMPREHENSIVE METABOLIC PANEL - Abnormal; Notable for the following components:      Result Value   Glucose, Bld 107 (*)    All other components within normal limits  CBC WITH DIFFERENTIAL/PLATELET - Abnormal; Notable for the following components:   RBC 3.89 (*)    Hemoglobin 12.1 (*)     HCT 37.9 (*)    Platelets 105 (*)    All other components within normal limits  C-REACTIVE PROTEIN - Abnormal; Notable for the following components:   CRP 12.3 (*)    All other components within normal limits  CK  CBG MONITORING, ED    EKG None  Radiology No  results found.  Procedures Procedures  {Document cardiac monitor, telemetry assessment procedure when appropriate:1}  Medications Ordered in ED Medications - No data to display  ED Course/ Medical Decision Making/ A&P Clinical Course as of 12/17/22 1934  Wed Dec 17, 2022  1929 CT head with frontal lobe hypodensity without prior to compare to. [JD]    Clinical Course User Index [JD] Laurence Spates, MD   {   Click here for ABCD2, HEART and other calculatorsREFRESH [name] before signing :1}                              Medical Decision Making Amount and/or Complexity of Data Reviewed Labs: ordered.   Medical Decision Making:   [name] [name] is a 77 y.o. male who presented to the ED today with recent fall and inability to swallow.  Vital signs reviewed.  History somewhat confusing, was able to find some records from recent hospitalization.  Patient apparently presented after fall and being found on the ground for period of time here is more confused than his baseline dementia on Friday.  Had a CT scan which showed right frontal lobe hypodensity, no prior to compare to at outside hospital although may be a chronic finding that was noted on CT head from April of this year.  However he reportedly failed multiple swallow test there.  He had mild CK elevation which has normalized here.  His lab work here is otherwise grossly unremarkable.  Will add on a urine.  Family states he is now at his baseline mental status and has proved from this morning.  He has no signs of traumatic injury however appears to be deconditioned and is still not able to pass swallow studies with speech at outside hospital.  Appears she is likely to need  PT, OT, speech therapy to evaluate his ability to tolerate p.o. and, for safe disposition.  He may need MRI to further evaluate if this hypodensity is new which could be contributing to his difficulty swallowing.   {crccomplexity:27900} Reviewed and confirmed nursing documentation for past medical history, family history, social history.  Reassessment and Plan:   ***    Patient's presentation is most consistent with {EM COPA:27473}     {Document critical care time when appropriate:1} {Document review of labs and clinical decision tools ie heart score, Chads2Vasc2 etc:1}  {Document your independent review of radiology images, and any outside records:1} {Document your discussion with family members, caretakers, and with consultants:1} {Document social determinants of health affecting pt's care:1} {Document your decision making why or why not admission, treatments were needed:1} Final Clinical Impression(s) / ED Diagnoses Final diagnoses:  None    Rx / DC Orders ED Discharge Orders     None

## 2022-12-17 NOTE — ED Notes (Signed)
ED TO INPATIENT HANDOFF REPORT  ED Nurse Name and Phone #: Joneen Boers, Paramedic / [phone]  S Name/Age/Gender [name] [name] Eastridge 77 y.o. male Room/Bed: 002C/002C  Code Status   Code Status: Full Code  Home/SNF/Other Home Patient oriented to: self, time, and situation Is this baseline? Yes   Triage Complete: Triage complete  Chief Complaint Generalized weakness [R53.1]  Triage Note PT arrives via POV with his wife. Pt fell out of his bed on Friday night while they were out of town, wife didn't hear him fall. Pt had difficulty walking the next day. Pt was seen and admitted to an ICU in [place] for rhabdomyolysis and released this morning. Wife states he has improved but is still having difficulty walking and trouble taking his medications. Pt does have dementia. He is on xarelto.   Allergies Allergies  Allergen Reactions   Latex Rash and Other (See Comments)    Reaction to knee wraps    Level of Care/Admitting Diagnosis ED Disposition     ED Disposition  Admit   Condition  --   Comment  Hospital Area: [facility] [[id number]]  Level of Care: Telemetry Medical [104]  May admit patient to [facility] or [facility] if equivalent level of care is available:: No  Covid Evaluation: Asymptomatic - no recent exposure (last 10 days) testing not required  Diagnosis: Generalized weakness [147829]  Admitting Physician: Angie Fava [[id number]]  Attending Physician: Angie Fava [[id number]]  Certification:: I certify this patient will need inpatient services for at least 2 midnights  Expected Medical Readiness: 12/19/2022          B Medical/Surgery History Past Medical History:  Diagnosis Date   Alzheimer disease (HCC)    Anxiety    Arthritis    Atrial fibrillation (HCC)    Chronic   Bipolar disorder (HCC)    Chronic low back pain    Dementia (HCC)    Depression    Dizziness    Dysrhythmia    a fib   ED (erectile dysfunction)     External hemorrhoids    Fatigue    HLD (hyperlipidemia)    HTN (hypertension)    Hypogonadism in male    Lipoma of skin    Memory loss    Mixed Alzheimer's and vascular dementia (HCC)    Poor balance    Restless legs syndrome (RLS)    Sleep apnea    cpap   Stroke (HCC) 1991   Tremor    Past Surgical History:  Procedure Laterality Date   COLONOSCOPY     KNEE ARTHROPLASTY Left 06/04/2017   Procedure: LEFT TOTAL KNEE ARTHROPLASTY WITH COMPUTER NAVIGATION;  Surgeon: Samson Frederic, MD;  Location: [facility];  Service: Orthopedics;  Laterality: Left;  NEEDS RNFA   LOOP RECORDER INSERTION N/A 11/25/2021   Procedure: LOOP RECORDER INSERTION;  Surgeon: Duke Salvia, MD;  Location: [facility] INVASIVE CV LAB;  Service: Cardiovascular;  Laterality: N/A;   TONSILLECTOMY AND ADENOIDECTOMY     TOTAL HIP ARTHROPLASTY Right 07/31/2016   Procedure: RIGHT TOTAL HIP ARTHROPLASTY ANTERIOR APPROACH;  Surgeon: Samson Frederic, MD;  Location: [facility];  Service: Orthopedics;  Laterality: Right;  Requesting RNFA     A IV Location/Drains/Wounds Patient Lines/Drains/Airways Status     Active Line/Drains/Airways     Name Placement date Placement time Site Days   Peripheral IV 12/17/22 20 G Anterior;Distal;Right;Upper Arm 12/17/22  2033  Arm  less than 1  Intake/Output Last 24 hours No intake or output data in the 24 hours ending 12/17/22 2124  Labs/Imaging Results for orders placed or performed during the hospital encounter of 12/17/22 (from the past 48 hour(s))  Comprehensive metabolic panel     Status: Abnormal   Collection Time: 12/17/22  5:25 PM  Result Value Ref Range   Sodium 141 135 - 145 mmol/L   Potassium 3.7 3.5 - 5.1 mmol/L   Chloride 104 98 - 111 mmol/L   CO2 25 22 - 32 mmol/L   Glucose, Bld 107 (H) 70 - 99 mg/dL    Comment: Glucose reference range applies only to samples taken after fasting for at least 8 hours.   BUN 19 8 - 23 mg/dL   Creatinine, Ser 1.61 0.61 - 1.24 mg/dL    Calcium 9.4 8.9 - 09.6 mg/dL   Total Protein 7.0 6.5 - 8.1 g/dL   Albumin 3.6 3.5 - 5.0 g/dL   AST 33 15 - 41 U/L   ALT 34 0 - 44 U/L   Alkaline Phosphatase 87 38 - 126 U/L   Total Bilirubin 0.9 0.3 - 1.2 mg/dL   GFR, Estimated >04 >54 mL/min    Comment: (NOTE) Calculated using the CKD-EPI Creatinine Equation (2021)    Anion gap 12 5 - 15    Comment: Performed at [facility], [address], [place], [place] [address]  CBC with Differential     Status: Abnormal   Collection Time: 12/17/22  5:25 PM  Result Value Ref Range   WBC 5.5 4.0 - 10.5 K/uL   RBC 3.89 (L) 4.22 - 5.81 MIL/uL   Hemoglobin 12.1 (L) 13.0 - 17.0 g/dL   HCT 91.4 (L) 78.2 - 95.6 %   MCV 97.4 80.0 - 100.0 fL   MCH 31.1 26.0 - 34.0 pg   MCHC 31.9 30.0 - 36.0 g/dL   RDW 21.3 08.6 - 57.8 %   Platelets 105 (L) 150 - 400 K/uL    Comment: REPEATED TO VERIFY   nRBC 0.0 0.0 - 0.2 %   Neutrophils Relative % 76 %   Neutro Abs 4.2 1.7 - 7.7 K/uL   Lymphocytes Relative 14 %   Lymphs Abs 0.7 0.7 - 4.0 K/uL   Monocytes Relative 5 %   Monocytes Absolute 0.3 0.1 - 1.0 K/uL   Eosinophils Relative 4 %   Eosinophils Absolute 0.2 0.0 - 0.5 K/uL   Basophils Relative 1 %   Basophils Absolute 0.0 0.0 - 0.1 K/uL   Immature Granulocytes 0 %   Abs Immature Granulocytes 0.02 0.00 - 0.07 K/uL    Comment: Performed at [facility], [address], [place], [place] [address]  CK     Status: None   Collection Time: 12/17/22  5:25 PM  Result Value Ref Range   Total CK 339 49 - 397 U/L    Comment: Performed at [facility], [address], [place], [place] [address]  C-reactive protein     Status: Abnormal   Collection Time: 12/17/22  5:25 PM  Result Value Ref Range   CRP 12.3 (H) <1.0 mg/dL    Comment: Performed at [facility], [address], [place], [place] [address]  Magnesium     Status: None   Collection Time: 12/17/22  5:25 PM  Result Value Ref Range   Magnesium 2.1 1.7 - 2.4 mg/dL    Comment:  Performed at [facility], [address] [place], [place] [address]  POC CBG, ED     Status: None   Collection Time: 12/17/22  5:31 PM  Result Value Ref Range   Glucose-Capillary 92 70 - 99 mg/dL    Comment: Glucose reference range applies only to samples taken after fasting for at least 8 hours.   No results found.  Pending Labs Unresulted Labs (From admission, onward)     Start     Ordered   12/18/22 0500  CBC with Differential/Platelet  Tomorrow morning,   R        12/17/22 2006   12/18/22 0500  Comprehensive metabolic panel  Tomorrow morning,   R        12/17/22 2006   12/18/22 0500  Magnesium  Tomorrow morning,   R        12/17/22 2006   12/17/22 1929  Urinalysis, Routine w reflex microscopic -Urine, Clean Catch  Once,   URGENT       Question:  Specimen Source  Answer:  Urine, Clean Catch   12/17/22 1928            Vitals/Pain Today's Vitals   12/17/22 1709 12/17/22 1830 12/17/22 2100 12/17/22 2123  BP: (!) 142/83  (!) 174/76   Pulse: 86  73   Resp: 16  18   Temp: 97.6 F (36.4 C)     TempSrc: Oral     SpO2: 97%  97%   PainSc:  2   0-No pain    Isolation Precautions No active isolations  Medications Medications  acetaminophen (TYLENOL) tablet 650 mg (has no administration in time range)    Or  acetaminophen (TYLENOL) suppository 650 mg (has no administration in time range)  melatonin tablet 3 mg (has no administration in time range)  ondansetron (ZOFRAN) injection 4 mg (has no administration in time range)  influenza vaccine adjuvanted (FLUAD) injection 0.5 mL (has no administration in time range)  lactated ringers bolus 500 mL (500 mLs Intravenous New Bag/Given 12/17/22 2122)    Mobility walks with device     Focused Assessments Cardiac Assessment Handoff:    Lab Results  Component Value Date   CKTOTAL 339 12/17/2022   CKMB 4.6 (H) 08/27/2011   TROPONINI <0.30 03/17/2013   Lab Results  Component Value Date   DDIMER 0.34 11/24/2021    Does the Patient currently have chest pain? No    R Recommendations: See Admitting Provider Note  Report given to:   Additional Notes:  PT has hx of confusion at baseline, family at bedside.

## 2022-12-18 ENCOUNTER — Inpatient Hospital Stay (HOSPITAL_COMMUNITY): Payer: Medicare PPO

## 2022-12-18 DIAGNOSIS — R269 Unspecified abnormalities of gait and mobility: Secondary | ICD-10-CM | POA: Diagnosis not present

## 2022-12-18 DIAGNOSIS — R531 Weakness: Secondary | ICD-10-CM | POA: Diagnosis not present

## 2022-12-18 DIAGNOSIS — W19XXXA Unspecified fall, initial encounter: Secondary | ICD-10-CM

## 2022-12-18 DIAGNOSIS — I639 Cerebral infarction, unspecified: Secondary | ICD-10-CM | POA: Diagnosis not present

## 2022-12-18 DIAGNOSIS — I6782 Cerebral ischemia: Secondary | ICD-10-CM | POA: Diagnosis not present

## 2022-12-18 DIAGNOSIS — D638 Anemia in other chronic diseases classified elsewhere: Secondary | ICD-10-CM | POA: Diagnosis present

## 2022-12-18 LAB — CBC WITH DIFFERENTIAL/PLATELET
Abs Immature Granulocytes: 0.03 10*3/uL (ref 0.00–0.07)
Basophils Absolute: 0 10*3/uL (ref 0.0–0.1)
Basophils Relative: 1 %
Eosinophils Absolute: 0.3 10*3/uL (ref 0.0–0.5)
Eosinophils Relative: 6 %
HCT: 33 % — ABNORMAL LOW (ref 39.0–52.0)
Hemoglobin: 10.8 g/dL — ABNORMAL LOW (ref 13.0–17.0)
Immature Granulocytes: 1 %
Lymphocytes Relative: 17 %
Lymphs Abs: 0.9 10*3/uL (ref 0.7–4.0)
MCH: 31.3 pg (ref 26.0–34.0)
MCHC: 32.7 g/dL (ref 30.0–36.0)
MCV: 95.7 fL (ref 80.0–100.0)
Monocytes Absolute: 0.4 10*3/uL (ref 0.1–1.0)
Monocytes Relative: 8 %
Neutro Abs: 3.5 10*3/uL (ref 1.7–7.7)
Neutrophils Relative %: 67 %
Platelets: 95 10*3/uL — ABNORMAL LOW (ref 150–400)
RBC: 3.45 MIL/uL — ABNORMAL LOW (ref 4.22–5.81)
RDW: 13.6 % (ref 11.5–15.5)
WBC: 5.1 10*3/uL (ref 4.0–10.5)
nRBC: 0 % (ref 0.0–0.2)

## 2022-12-18 LAB — T4, FREE: Free T4: 1.48 ng/dL — ABNORMAL HIGH (ref 0.61–1.12)

## 2022-12-18 LAB — COMPREHENSIVE METABOLIC PANEL
ALT: 8 U/L (ref 0–44)
AST: 33 U/L (ref 15–41)
Albumin: 3.2 g/dL — ABNORMAL LOW (ref 3.5–5.0)
Alkaline Phosphatase: 78 U/L (ref 38–126)
Anion gap: 13 (ref 5–15)
BUN: 17 mg/dL (ref 8–23)
CO2: 25 mmol/L (ref 22–32)
Calcium: 9.7 mg/dL (ref 8.9–10.3)
Chloride: 105 mmol/L (ref 98–111)
Creatinine, Ser: 1 mg/dL (ref 0.61–1.24)
GFR, Estimated: >60 mL/min (ref >60)
Glucose, Bld: 99 mg/dL (ref 70–99)
Potassium: 3.7 mmol/L (ref 3.5–5.1)
Sodium: 143 mmol/L (ref 135–145)
Total Bilirubin: 1 mg/dL (ref 0.3–1.2)
Total Protein: 6.5 g/dL (ref 6.5–8.1)

## 2022-12-18 LAB — MAGNESIUM: Magnesium: 1.8 mg/dL (ref 1.7–2.4)

## 2022-12-18 LAB — VALPROIC ACID LEVEL: Valproic Acid Lvl: <10 ug/mL — ABNORMAL LOW (ref 50.0–100.0)

## 2022-12-18 MED ORDER — HALOPERIDOL LACTATE 5 MG/ML IJ SOLN
1.0000 mg | Freq: Once | INTRAMUSCULAR | Status: AC | PRN
  Administered 2022-12-18: 1 mg via INTRAVENOUS
  Filled 2022-12-18: qty 1

## 2022-12-18 MED ORDER — FOLIC ACID 1 MG PO TABS
1.0000 mg | ORAL_TABLET | Freq: Every day | ORAL | Status: DC
  Administered 2022-12-18: 1 mg via ORAL
  Filled 2022-12-18: qty 1

## 2022-12-18 NOTE — Care Management CC44 (Signed)
Condition Code 44 Documentation Completed  Patient Details  Name: [name] [name] MRN: [id number] Date of Birth: 04-Dec-1945   Condition Code 44 given:  Yes Patient signature on Condition Code 44 notice:  Yes Documentation of 2 MD's agreement:  Yes Code 44 added to claim:  Yes    Baldemar Lenis, LCSW 12/18/2022, 3:45 PM

## 2022-12-18 NOTE — Progress Notes (Addendum)
PROGRESS NOTE   [name] [name]  KGM:[id number]    DOB: 1945-06-30    DOA: 12/17/2022  PCP: Merri Brunette, MD   I have briefly reviewed patients previous medical records in [facility].  Chief Complaint  Patient presents with   Fall   Weakness    Brief Hospital Course:  77 year old married male, PMH of Alzheimer's dementia, chronic A-fib on Xarelto, HTN, HLD, OSA on nocturnal CPAP, bipolar disorder, anemia of chronic disease, recent travel and hospitalization in [place] after he sustained a ground-level fall on 12/12/2022, no reported head injury or LOC, laid on the hotel room floor for approximately 12 hours, subsequently hospitalized for generalized weakness and ambulatory dysfunction, where CT head showed an age-indeterminate right frontal lobe hypodensity, they voluntarily underwent discharge on 12/17/2022 and presented to [facility] for further evaluation and management.  Admitted for evaluation and management of generalized weakness, ambulatory dysfunction and CT brain abnormality at OSH.   Assessment & Plan:  Principal Problem:   Generalized weakness Active Problems:   Chronic atrial fibrillation (HCC)   Essential hypertension   Bipolar disorder (HCC)   Hyperlipidemia   Obstructive sleep apnea   Fall at home, initial encounter   Anemia of chronic disease   Generalized weakness and ambulatory dysfunction/mechanical fall and ongoing fall risk: MRI brain with no acute abnormality.  Shows moderate small vessel disease and multiple old right-sided infarcts. Suspect that this is due to very advanced age, advanced dementia and physical deconditioning.  No obvious metabolic or infectious etiology noted..  Mildly orthostatic this morning with SBP dropping from 148 lying to 122 standing after 3 minutes which could certainly contribute.  Encourage oral hydration. Depakote level less than 10.  Chest x-ray negative.  UA not suggestive of UTI. PT evaluated and recommend SNF.   TOC consulted.  Abnormal TFTs TSH 5.182, free T4: 1.48.  Clinically does not appear hyper or hypothyroid. ?  Sick euthyroid. Recommend repeating a full TFTs in 4 to 6 weeks.  Chronic atrial fibrillation with secondary hypercoagulable state Controlled ventricular rate on telemetry.  DC telemetry to minimize patient agitation Continue home Xarelto.  However if increased fall risk, this would be detrimental and will need to be reassessed regarding continuation based on risk versus benefit.  Essential hypertension: Controlled on home dose of lisinopril 10 Mg daily  Hyperlipidemia: Continue home dose of statins  OSA on nightly CPAP Continue CPAP  Bipolar disorder Continue Depakote and Lamictal.  Depakote level is low,?  Compliance.  Lamictal level pending.  Dementia, possibly Alzheimer's/vascular Continue Namenda, Aricept and may be on Depakote for behavioral issues.  Parkinson's disease Continue Sinemet.  May have Parkinson's plus syndrome.  Anemia and thrombocytopenia Chronic and stable.  Body mass index is 24.77 kg/m.   DVT prophylaxis: SCDs Start: 12/17/22 2006     Code Status: Full Code:  Family Communication: Daughter via phone. Disposition:  Despite care expected to cross 2 midnights, lacks inpatient medical intensity.  Appropriate to change to observation.  Discussed with [facility] RN.     Consultants:     Procedures:     Antimicrobials:      Subjective:  Per nursing report, overnight was unable to do MRI of the brain due to ongoing agitation despite IV Ativan.  Seen this morning along with PT who was working with patient.  Reportedly ambulated unsteadily 60 feet with PT assistance with significant fall risk.  Patient pleasantly confused, alert and oriented only to self, feels that he is in [place].  Follows occasional simple instructions.  Objective:   Vitals:   12/18/22 0316 12/18/22 0457 12/18/22 0724 12/18/22 1044  BP: (!) 148/59  (!) 151/61 (!) 131/55   Pulse: (!) 51  (!) 59 76  Resp:   19 17  Temp: 98.4 F (36.9 C)  97.8 F (36.6 C) 97.6 F (36.4 C)  TempSrc: Axillary  Oral Oral  SpO2:   98% 95%  Weight:  78.3 kg    Height:        General exam: Elderly male, moderately built and frail sitting up in reclining chair with PT assistance. Respiratory system: Clear to auscultation. Respiratory effort normal. Cardiovascular system: S1 & S2 heard, RRR. No JVD, murmurs, rubs, gallops or clicks. No pedal edema.  Telemetry personally reviewed: A-fib with controlled ventricular rate. Gastrointestinal system: Abdomen is nondistended, soft and nontender. No organomegaly or masses felt. Normal bowel sounds heard. Central nervous system: Alert and oriented only to self. No focal neurological deficits. Extremities: Symmetric 5 x 5 power. Skin: No rashes, lesions or ulcers Psychiatry: Judgement and insight impaired. Mood & affect pleasantly confused.     Data Reviewed:   I have personally reviewed following labs and imaging studies   CBC: Recent Labs  Lab 12/17/22 1725 12/18/22 0458  WBC 5.5 5.1  NEUTROABS 4.2 3.5  HGB 12.1* 10.8*  HCT 37.9* 33.0*  MCV 97.4 95.7  PLT 105* 95*    Basic Metabolic Panel: Recent Labs  Lab 12/17/22 1725 12/18/22 0458  NA 141 143  K 3.7 3.7  CL 104 105  CO2 25 25  GLUCOSE 107* 99  BUN 19 17  CREATININE 1.04 1.00  CALCIUM 9.4 9.7  MG 2.1 1.8    Liver Function Tests: Recent Labs  Lab 12/17/22 1725 12/18/22 0458  AST 33 33  ALT 34 8  ALKPHOS 87 78  BILITOT 0.9 1.0  PROT 7.0 6.5  ALBUMIN 3.6 3.2*    CBG: Recent Labs  Lab 12/17/22 1731  GLUCAP 92    Microbiology Studies:  No results found for this or any previous visit (from the past 240 hour(s)).  Radiology Studies:  DG Chest Port 1 View  Result Date: 12/18/2022 CLINICAL DATA:  Fall, generalized weakness EXAM: PORTABLE CHEST 1 VIEW COMPARISON:  06/25/2022 FINDINGS: Loop recorder projects over the left chest. Cardiomegaly. No  confluent opacities, effusions or edema. No acute bony abnormality. IMPRESSION: Cardiomegaly.  No active disease. Electronically Signed   By: Charlett Nose M.D.   On: 12/18/2022 01:11    Scheduled Meds:    atorvastatin  40 mg Oral QPM   carbidopa-levodopa  1 tablet Oral BID   divalproex  250 mg Oral QPM   donepezil  10 mg Oral QHS   influenza vaccine adjuvanted  0.5 mL Intramuscular Tomorrow-1000   lamoTRIgine  150 mg Oral BID   lisinopril  10 mg Oral QPM   memantine  10 mg Oral BID   rivaroxaban  20 mg Oral Daily    Continuous Infusions:     LOS: 1 day     Marcellus Scott, MD,  FACP, [facility], [place], [facility]   [facility] Advisor [place]     To contact the attending provider between 7A-7P or the covering provider during after hours 7P-7A, please log into the web site [email or web address] and access using universal [place] password for that web site. If you do not have the password, please call the hospital operator.  12/18/2022, 2:00 PM

## 2022-12-18 NOTE — Evaluation (Addendum)
Physical Therapy Evaluation Patient Details Name: [name] [name] MRN: [id number] DOB: 1945/07/22 Today's Date: 12/18/2022  History of Present Illness  77 y.o. male admitted to [facility] on 12/17/2022 with generalized weakness after presenting from home to [facility] ED complaining of generalized weakness. Recently admitted and d/c from a hospital in [place] for a fall and rhabdomyolysis. PMHx: Alzheimer dementia, chronic atrial fibrillation for which she is chronically anticoagulated on Xarelto, hypertension, hyperlipidemia, obstructive sleep apnea on nocturnal CPAP, bipolar disorder, anemia of chronic disease associated baseline hemoglobin 10-12.5.  Clinical Impression  Pt admitted with above diagnosis. Limited hx, taken from ED notes and prior admission for home set-up. Pt oriented only to self. Required up to mod assist for several significant and spontaneous epsoides of LOB while ambulating with RW today. Min assist for bed mobility and transfers from various surfaces. Max VC throughout for direction and tasks. Pt currently with functional limitations due to the deficits listed below (see PT Problem List). Pt will benefit from acute skilled PT to increase their independence and safety with mobility to allow discharge.       Addendum 1300: Spoke with OT who confirmed PLOF with family this afternoon. Pt essentially dependent with ADLs PTA, more appropriate for SNF vs Memory care. See OT note for further baseline details.    If plan is discharge home, recommend the following: A lot of help with walking and/or transfers;A lot of help with bathing/dressing/bathroom;Assistance with cooking/housework;Direct supervision/assist for medications management;Direct supervision/assist for financial management;Assist for transportation;Help with stairs or ramp for entrance;Supervision due to cognitive status   Can travel by private vehicle        Equipment Recommendations None recommended by PT   Recommendations for Other Services  Rehab consult    Functional Status Assessment Patient has had a recent decline in their functional status and demonstrates the ability to make significant improvements in function in a reasonable and predictable amount of time.     Precautions / Restrictions Precautions Precautions: Fall Restrictions Weight Bearing Restrictions: No      Mobility  Bed Mobility Overal bed mobility: Needs Assistance Bed Mobility: Supine to Sit     Supine to sit: Min assist     General bed mobility comments: Min assist for trunk support. Max VC for sequencing to EOB.    Transfers Overall transfer level: Needs assistance Equipment used: Rolling walker (2 wheels) Transfers: Sit to/from Stand Sit to Stand: Min assist           General transfer comment: Min assist for boost to stand from bed x2 and from toilet. VC for hand placement.    Ambulation/Gait Ambulation/Gait assistance: Mod assist Gait Distance (Feet): 60 Feet (+15) Assistive device: Rolling walker (2 wheels) Gait Pattern/deviations: Step-through pattern, Decreased stride length, Staggering left, Drifts right/left, Trunk flexed, Festinating, Shuffle, Knee flexed in stance - left, Knee flexed in stance - right Gait velocity: dec Gait velocity interpretation: <1.31 ft/sec, indicative of household ambulator   General Gait Details: Educated on safe AD use with RW for support. Pt with several significant episodes of LOB towards Lt. Unsafe use of RW, cues to keep placement on the ground at all times. Difficulty navigating in congested areas. Shuffled festinating steps, cues for upright posture, larger stride. No overt buckling.  Stairs            Wheelchair Mobility     Tilt Bed    Modified Rankin (Stroke Patients Only) Modified Rankin (Stroke Patients Only) Pre-Morbid Rankin Score: No significant  disability Modified Rankin: Moderately severe disability     Balance                                              Pertinent Vitals/Pain Pain Assessment Pain Assessment: No/denies pain    Home Living Family/patient expects to be discharged to:: Private residence Living Arrangements: Spouse/significant other Available Help at Discharge: Family;Available 24 hours/day Type of Home: House Home Access: Stairs to enter Entrance Stairs-Rails: None Entrance Stairs-Number of Steps: 1 Alternate Level Stairs-Number of Steps: 1 flight Home Layout: Two level;Able to live on main level with bedroom/bathroom Home Equipment: Rolling Walker (2 wheels);Cane - single point;BSC/3in1;Grab bars - tub/shower Additional Comments: Info taken from prior admission: Pt poor historian this date.    Prior Function Prior Level of Function : Patient poor historian/Family not available             Mobility Comments: Per ED notes, pt ind with [facility] ADLs Comments: Per ED notes, pt ind with [facility]     Extremity/Trunk Assessment   Upper Extremity Assessment Upper Extremity Assessment: Defer to OT evaluation    Lower Extremity Assessment Lower Extremity Assessment: Generalized weakness (Contusion Rt shin and knee.)       Communication   Communication Communication: No apparent difficulties Cueing Techniques: Verbal cues;Gestural cues;Tactile cues;Visual cues  Cognition Arousal: Lethargic Behavior During Therapy: WFL for tasks assessed/performed Overall Cognitive Status: No family/caregiver present to determine baseline cognitive functioning Area of Impairment: Orientation, Memory, Following commands, Safety/judgement, Problem solving                 Orientation Level: Disoriented to, Time, Place, Situation   Memory: Decreased recall of precautions, Decreased short-term memory Following Commands: Follows one step commands inconsistently, Follows one step commands with increased time Safety/Judgement: Decreased awareness of safety, Decreased awareness of deficits    Problem Solving: Slow processing, Decreased initiation, Difficulty sequencing, Requires verbal cues, Requires tactile cues General Comments: Unsure of month, year, location. Thought that he was in [place]. Disoriented to Situation.        General Comments      Exercises     Assessment/Plan    PT Assessment Patient needs continued PT services  PT Problem List Decreased strength;Decreased activity tolerance;Decreased balance;Decreased mobility;Decreased coordination;Decreased cognition;Decreased knowledge of use of DME;Decreased safety awareness;Decreased knowledge of precautions       PT Treatment Interventions DME instruction;Gait training;Stair training;Functional mobility training;Therapeutic activities;Therapeutic exercise;Balance training;Neuromuscular re-education;Patient/family education;Cognitive remediation    PT Goals (Current goals can be found in the Care Plan section)  Acute Rehab PT Goals Patient Stated Goal: none stated PT Goal Formulation: Patient unable to participate in goal setting Time For Goal Achievement: 01/01/23 Potential to Achieve Goals: Good    Frequency Min 1X/week     Co-evaluation               AM-PAC PT "6 Clicks" Mobility  Outcome Measure Help needed turning from your back to your side while in a flat bed without using bedrails?: A Little Help needed moving from lying on your back to sitting on the side of a flat bed without using bedrails?: A Little Help needed moving to and from a bed to a chair (including a wheelchair)?: A Little Help needed standing up from a chair using your arms (e.g., wheelchair or bedside chair)?: A Little Help needed to walk in hospital room?:  A Lot Help needed climbing 3-5 steps with a railing? : Total 6 Click Score: 15    End of Session Equipment Utilized During Treatment: Gait belt Activity Tolerance: Patient tolerated treatment well Patient left: in chair;with call bell/phone within reach;with chair  alarm set (posey waist alarm) Nurse Communication: Mobility status PT Visit Diagnosis: Unsteadiness on feet (R26.81);Other abnormalities of gait and mobility (R26.89);Repeated falls (R29.6);Muscle weakness (generalized) (M62.81);History of falling (Z91.81);Difficulty in walking, not elsewhere classified (R26.2);Other symptoms and signs involving the nervous system (R29.898)    Time: 1610-9604 PT Time Calculation (min) (ACUTE ONLY): 30 min   Charges:   PT Evaluation $PT Eval Moderate Complexity: 1 Mod PT Treatments $Gait Training: 8-22 mins PT General Charges $$ ACUTE PT VISIT: 1 Visit         Kathlyn Sacramento, PT, DPT [facility]  Rehabilitation Services Physical Therapist Office: [phone] Website: [email or web address]   Berton Mount 12/18/2022, 10:06 AM

## 2022-12-18 NOTE — TOC Initial Note (Addendum)
Transition of Care [facility]) - Initial/Assessment Note    Patient Details  Name: [name] [name] MRN: [id number] Date of Birth: 04-16-1945  Transition of Care [facility]) CM/SW Contact:    Baldemar Lenis, LCSW Phone Number: 12/18/2022, 2:45 PM  Clinical Narrative:         CSW met with patient's daughter, Gloris Manchester, at bedside to discuss SNF recommendation. Traci was hopeful for CIR, but CSW explained that was not recommended for the patient as he would not qualify. Family is also working towards placement at [facility] for long term, but would like SNF first for rehab. CSW completed referral and faxed out, will follow with bed offers.         UPDATE: CSW met with patient's spouse, [name], at bedside to answer questions and provide bed offers. [facility] chose [facility]. CSW confirmed bed availability with [facility] and requested CMA to initiate insurance authorization. CSW to follow.   Expected Discharge Plan: Skilled Nursing Facility Barriers to Discharge: Continued Medical Work up, English as a second language teacher   Patient Goals and CMS Choice Patient states their goals for this hospitalization and ongoing recovery are:: patient unable to participate in goal setting, not fully oriented CMS Medicare.gov Compare Post Acute Care list provided to:: Patient Represenative (must comment) Choice offered to / list presented to : Adult Children [place] ownership interest in [facility].provided to:: Adult Children    Expected Discharge Plan and Services     Post Acute Care Choice: Skilled Nursing Facility Living arrangements for the past 2 months: Single Family Home                                      Prior Living Arrangements/Services Living arrangements for the past 2 months: Single Family Home Lives with:: Spouse Patient language and need for interpreter reviewed:: No Do you feel safe going back to the place where you live?: Yes      Need for Family  Participation in Patient Care: Yes (Comment) Care giver support system in place?: No (comment)   Criminal Activity/Legal Involvement Pertinent to Current Situation/Hospitalization: No - Comment as needed  Activities of Daily Living   ADL Screening (condition at time of admission) Independently performs ADLs?: No Does the patient have a NEW difficulty with bathing/dressing/toileting/self-feeding that is expected to last >3 days?: Yes (Initiates electronic notice to provider for possible OT consult) Does the patient have a NEW difficulty with getting in/out of bed, walking, or climbing stairs that is expected to last >3 days?: Yes (Initiates electronic notice to provider for possible PT consult) Does the patient have a NEW difficulty with communication that is expected to last >3 days?: Yes (Initiates electronic notice to provider for possible SLP consult) Is the patient deaf or have difficulty hearing?: No Does the patient have difficulty seeing, even when wearing glasses/contacts?: No Does the patient have difficulty concentrating, remembering, or making decisions?: No  Permission Sought/Granted Permission sought to share information with : Facility Medical sales representative, Family Supports Permission granted to share information with : Yes, Verbal Permission Granted  Share Information with NAME: Lajoyce Corners  Permission granted to share info w AGENCY: SNF  Permission granted to share info w Relationship: Daughter, Spouse     Emotional Assessment Appearance:: Appears stated age Attitude/Demeanor/Rapport: Unable to Assess Affect (typically observed): Unable to Assess Orientation: : Oriented to Self Alcohol / Substance Use: Not Applicable Psych Involvement: No (comment)  Admission diagnosis:  Generalized weakness [R53.1] Patient Active Problem List   Diagnosis Date Noted   Fall at home, initial encounter 12/18/2022   Anemia of chronic disease 12/18/2022   Generalized weakness  12/17/2022   History of loop recorder - STJ 04/15/2022   Syncope, cardiogenic 11/24/2021   Bradycardia 11/24/2021   Obstructive sleep apnea 02/14/2021   Urinary urgency 07/11/2020   History of total knee arthroplasty 12/16/2018   Osteoarthritis of left knee 06/04/2017   B12 deficiency 03/19/2017   Loss of memory 03/18/2017   Diplopia 08/06/2016   History of atrial fibrillation 08/06/2016   History of bipolar disorder 08/06/2016   History of restless legs syndrome 08/06/2016   Weakness 02/10/2016   Essential tremor 07/17/2015   Mixed Alzheimer's and vascular dementia (HCC) 10/28/2013   Parkinsonian features 03/31/2013   History of stroke 03/23/2013   Bipolar disorder (HCC) 03/20/2013   Hyperlipidemia 03/20/2013   Brain TIA 03/18/2013   Gait instability 03/17/2013   Chronic atrial fibrillation (HCC) 03/17/2013   Essential hypertension 03/17/2013   PCP:  Merri Brunette, MD Pharmacy:   [facility] - [place], [place] - [address] [place] [address] Phone: [phone] Fax: [phone]  [place] [facility] [address] [place] [address] Phone: [phone] Fax: [phone]     Social Determinants of Health (SDOH) Social History: SDOH Screenings   Food Insecurity: No Food Insecurity (12/17/2022)  Housing: Low Risk  (12/17/2022)  Transportation Needs: No Transportation Needs (12/17/2022)  Utilities: Not At Risk (12/17/2022)  Tobacco Use: Medium Risk (12/17/2022)   SDOH Interventions:     Readmission Risk Interventions     No data to display

## 2022-12-18 NOTE — Progress Notes (Signed)
Occupatonal Therapy Note  Pt returned from MRI however very lethargic from medication. Spoke with wife and daughter regarding PLOF. At baseline, pt ambulates without an assistive device. Family has tried to get him to use a cane, however he does not use one due to cognitive deficits. Family reports at least 3 falls in the last month and requiring max to total A with all ADL/IADL tasks due to cognitive deficits. Family reports that Abdirahman "has a bed " at [facility] care unit - discussed with SW. Will return later time to attempt mobility. Pt will most likely benefit from continued inpatient follow up therapy, <3 hours/day. Full note to follow.     12/18/22 1250  OT Visit Information  Last OT Received On 12/18/22  Assistance Needed +1  History of Present Illness 77 y.o. male admitted to [facility] on 12/17/2022 with generalized weakness after presenting from home to [facility] ED complaining of generalized weakness. Recently admitted and d/c from a hospital in [place] for a fall and rhabdomyolysis. PMHx: Alzheimer dementia, chronic atrial fibrillation for which she is chronically anticoagulated on Xarelto, hypertension, hyperlipidemia, obstructive sleep apnea on nocturnal CPAP, bipolar disorder, anemia of chronic disease associated baseline hemoglobin 10-12.5.  OT Time Calculation  OT Start Time (ACUTE ONLY) 1240  OT Stop Time (ACUTE ONLY) 1253  OT Time Calculation (min) 13 min  OT General Charges  $OT Visit 1 Visit   Luisa Dago, OT/L   Acute OT Clinical Specialist Acute Rehabilitation Services Pager [phone] Office [phone]

## 2022-12-18 NOTE — Care Management Obs Status (Signed)
MEDICARE OBSERVATION STATUS NOTIFICATION   Patient Details  Name: BRANDEN SHALLENBERGER MRN: [id number] Date of Birth: 02-22-1946   Medicare Observation Status Notification Given:  Yes    Baldemar Lenis, LCSW 12/18/2022, 3:45 PM

## 2022-12-18 NOTE — Progress Notes (Signed)
? ?  Inpatient Rehab Admissions Coordinator : ? ?Per therapy recommendations patient was screened for CIR candidacy by Ottie Glazier RN MSN. Patient does not appear to demonstrate the medical neccesity for a Hospital Rehabilitation /CIR admit. I will not place a Rehab Consult. Recommend other Rehab Venues to be pursued. Please contact me with any questions. ? ?Ottie Glazier RN MSN ?Admissions Coordinator ?[phone]  ?

## 2022-12-18 NOTE — NC FL2 (Addendum)
[place] MEDICAID FL2 LEVEL OF CARE FORM     IDENTIFICATION  Patient Name: [name] [name] Birthdate: 06-Jan-1946 Sex: male Admission Date (Current Location): 12/17/2022  [facility] and [place] Number:  Producer, television/film/video and Address:  The . [facility], [address] [place], [place] [address]      Provider Number: [id number]  Attending Physician Name and Address:  Elease Etienne, MD  Relative Name and Phone Number:       Current Level of Care: Hospital Recommended Level of Care: Skilled Nursing Facility Prior Approval Number:    Date Approved/Denied:   PASRR Number: [id number]  Discharge Plan: SNF    Current Diagnoses: Patient Active Problem List   Diagnosis Date Noted   Fall at home, initial encounter 12/18/2022   Anemia of chronic disease 12/18/2022   Generalized weakness 12/17/2022   History of loop recorder - STJ 04/15/2022   Syncope, cardiogenic 11/24/2021   Bradycardia 11/24/2021   Obstructive sleep apnea 02/14/2021   Urinary urgency 07/11/2020   History of total knee arthroplasty 12/16/2018   Osteoarthritis of left knee 06/04/2017   B12 deficiency 03/19/2017   Loss of memory 03/18/2017   Diplopia 08/06/2016   History of atrial fibrillation 08/06/2016   History of bipolar disorder 08/06/2016   History of restless legs syndrome 08/06/2016   Weakness 02/10/2016   Essential tremor 07/17/2015   Mixed Alzheimer's and vascular dementia (HCC) 10/28/2013   Parkinsonian features 03/31/2013   History of stroke 03/23/2013   Bipolar disorder (HCC) 03/20/2013   Hyperlipidemia 03/20/2013   Brain TIA 03/18/2013   Gait instability 03/17/2013   Chronic atrial fibrillation (HCC) 03/17/2013   Essential hypertension 03/17/2013    Orientation RESPIRATION BLADDER Height & Weight     Self  Normal Continent Weight: 172 lb 9.9 oz (78.3 kg) Height:  5\' 10"  (177.8 cm)  BEHAVIORAL SYMPTOMS/MOOD NEUROLOGICAL BOWEL NUTRITION STATUS      Continent  Diet (Regular)  AMBULATORY STATUS COMMUNICATION OF NEEDS Skin   Extensive Assist Verbally Normal                       Personal Care Assistance Level of Assistance  Bathing, Feeding, Dressing Bathing Assistance: Limited assistance Feeding assistance: Limited assistance Dressing Assistance: Limited assistance     Functional Limitations Info  Sight Sight Info: Impaired        SPECIAL CARE FACTORS FREQUENCY  PT (By licensed PT), OT (By licensed OT)     PT Frequency: 5x/week OT Frequency: 5x/week            Contractures      Additional Factors Info  Code Status, Allergies Code Status Info: Full Allergies Info: Latex           Current Medications (12/18/2022):  This is the current hospital active medication list Current Facility-Administered Medications  Medication Dose Route Frequency Provider Last Rate Last Admin   acetaminophen (TYLENOL) tablet 650 mg  650 mg Oral Q6H PRN Howerter, Justin B, DO       Or   acetaminophen (TYLENOL) suppository 650 mg  650 mg Rectal Q6H PRN Howerter, Justin B, DO       atorvastatin (LIPITOR) tablet 40 mg  40 mg Oral QPM Howerter, Justin B, DO   40 mg at 12/17/22 2256   carbidopa-levodopa (SINEMET IR) 25-250 MG per tablet immediate release 1 tablet  1 tablet Oral BID Howerter, Justin B, DO   1 tablet at 12/18/22 [phone]  divalproex (DEPAKOTE) DR tablet 250 mg  250 mg Oral QPM Howerter, Justin B, DO   250 mg at 12/17/22 2256   donepezil (ARICEPT) tablet 10 mg  10 mg Oral QHS Howerter, Justin B, DO   10 mg at 12/17/22 2256   folic acid (FOLVITE) tablet 1 mg  1 mg Oral QHS Hongalgi, Anand D, MD       influenza vaccine adjuvanted (FLUAD) injection 0.5 mL  0.5 mL Intramuscular Tomorrow-1000 Howerter, Justin B, DO       lamoTRIgine (LAMICTAL) tablet 150 mg  150 mg Oral BID Howerter, Justin B, DO   150 mg at 12/18/22 0942   lisinopril (ZESTRIL) tablet 10 mg  10 mg Oral QPM Howerter, Justin B, DO   10 mg at 12/17/22 2256   melatonin tablet 3  mg  3 mg Oral QHS PRN Howerter, Justin B, DO       memantine (NAMENDA) tablet 10 mg  10 mg Oral BID Howerter, Justin B, DO   10 mg at 12/18/22 0942   ondansetron (ZOFRAN) injection 4 mg  4 mg Intravenous Q6H PRN Howerter, Justin B, DO       rivaroxaban (XARELTO) tablet 20 mg  20 mg Oral Daily Howerter, Justin B, DO   20 mg at 12/18/22 4742     Discharge Medications: Please see discharge summary for a list of discharge medications.  Relevant Imaging Results:  Relevant Lab Results:   Additional Information ss#[id number]  Antion Felipa Emory, Student-Social Work

## 2022-12-18 NOTE — Evaluation (Signed)
Occupational Therapy Evaluation Patient Details Name: [name] [name] MRN: [id number] DOB: [birth year] Today's Date: 12/18/2022   History of Present Illness 77 y.o. male admitted to [facility] on 12/17/2022 with generalized weakness after presenting from home to [facility] ED complaining of generalized weakness. Recently admitted and d/c from a hospital in [place] for a fall and rhabdomyolysis. PMHx: Alzheimer dementia, chronic atrial fibrillation for which she is chronically anticoagulated on Xarelto, hypertension, hyperlipidemia, obstructive sleep apnea on nocturnal CPAP, bipolar disorder, anemia of chronic disease associated baseline hemoglobin 10-12.5.   Clinical Impression   PTA pt living at home with his wife and requires max to total care with ADL /IADL tasks due to cognitive deficits. Pt ambulates with a shuffling gait and does not use an AD.  Pt has had several falls PTA and demonstrates a decline in functional mobility. Patient will benefit from continued inpatient follow up therapy, <3 hours/day. Acute OT to follow.       If plan is discharge home, recommend the following: Two people to help with walking and/or transfers;Two people to help with bathing/dressing/bathroom    Functional Status Assessment  Patient has had a recent decline in their functional status and demonstrates the ability to make significant improvements in function in a reasonable and predictable amount of time.  Equipment Recommendations  None recommended by OT    Recommendations for Other Services       Precautions / Restrictions Precautions Precautions: Fall      Mobility Bed Mobility Overal bed mobility: Needs Assistance Bed Mobility: Supine to Sit     Supine to sit: Min assist          Transfers Overall transfer level: Needs assistance Equipment used: 1 person hand held assist Transfers: Sit to/from Stand, Bed to chair/wheelchair/BSC Sit to Stand: Mod assist                   Balance Overall balance assessment: History of Falls, Needs assistance   Sitting balance-Leahy Scale: Poor Sitting balance - Comments: leaning posteriorly     Standing balance-Leahy Scale: Poor                             ADL either performed or assessed with clinical judgement   ADL Overall ADL's : Needs assistance/impaired Eating/Feeding: Maximal assistance   Grooming: Moderate assistance   Upper Body Bathing: Maximal assistance   Lower Body Bathing: Total assistance   Upper Body Dressing : Maximal assistance   Lower Body Dressing: Total assistance   Toilet Transfer: Moderate assistance   Toileting- Clothing Manipulation and Hygiene: Maximal assistance       Functional mobility during ADLs: Moderate assistance       Vision Baseline Vision/History: 1 Wears glasses       Perception         Praxis         Pertinent Vitals/Pain Pain Assessment Pain Assessment: Faces Faces Pain Scale: No hurt     Extremity/Trunk Assessment Upper Extremity Assessment Upper Extremity Assessment: Generalized weakness   Lower Extremity Assessment Lower Extremity Assessment: Defer to PT evaluation   Cervical / Trunk Assessment Cervical / Trunk Assessment: Other exceptions (posterior bias)   Communication Communication Communication: No apparent difficulties   Cognition Arousal: Lethargic, Suspect due to medications Behavior During Therapy: WFL for tasks assessed/performed Overall Cognitive Status: Impaired/Different from baseline Area of Impairment: Orientation, Attention, Memory, Following commands, Safety/judgement, Awareness, Problem solving  Orientation Level: Disoriented to, Place, Time, Situation Current Attention Level: Sustained Memory: Decreased short-term memory Following Commands: Follows one step commands inconsistently Safety/Judgement: Decreased awareness of safety, Decreased awareness of deficits Awareness:  Intellectual Problem Solving: Slow processing, Decreased initiation, Difficulty sequencing, Requires verbal cues, Requires tactile cues       General Comments       Exercises     Shoulder Instructions      Home Living Family/patient expects to be discharged to:: Private residence Living Arrangements: Spouse/significant other Available Help at Discharge: Family;Available 24 hours/day Type of Home: House Home Access: Stairs to enter [employer] of Steps: 1 Entrance Stairs-Rails: None Home Layout: Two level;Able to live on main level with bedroom/bathroom Alternate Level Stairs-Number of Steps: 1 flight Alternate Level Stairs-Rails: Right;Left Bathroom Shower/Tub: Producer, television/film/video: Standard Bathroom Accessibility: Yes How Accessible: Accessible via walker Home Equipment: Rolling Walker (2 wheels);Cane - single point;BSC/3in1;Grab bars - tub/shower          Prior Functioning/Environment Prior Level of Function : Needs assist  Cognitive Assist : Mobility (cognitive);ADLs (cognitive) Mobility (Cognitive): Set up cues ADLs (Cognitive): Step by step cues Physical Assist : ADLs (physical)   ADLs (physical): Grooming;Dressing;Bathing;Toileting;IADLs;Feeding Mobility Comments: multiple falls; does not use an AD ADLs Comments: Max to total A with ADL tasks; wife bahtes and dresses him; able to feedhimself if given the corect utensil. Walks to the bathroom however poor peri hygiene per wife        OT Problem List: Decreased strength;Decreased range of motion;Decreased activity tolerance;Impaired balance (sitting and/or standing);Decreased coordination;Decreased cognition;Decreased safety awareness      OT Treatment/Interventions: Self-care/ADL training;Therapeutic exercise;DME and/or AE instruction;Therapeutic activities;Cognitive remediation/compensation;Visual/perceptual remediation/compensation;Patient/family education;Balance training    OT  Goals(Current goals can be found in the care plan section) Acute Rehab OT Goals Patient Stated Goal: per daughter  - for her Dad to mobilize OT Goal Formulation: With patient/family Time For Goal Achievement: 01/01/23 Potential to Achieve Goals: Good  OT Frequency: Min 1X/week    Co-evaluation              AM-PAC OT "6 Clicks" Daily Activity     Outcome Measure Help from another person eating meals?: A Lot Help from another person taking care of personal grooming?: A Lot Help from another person toileting, which includes using toliet, bedpan, or urinal?: Total Help from another person bathing (including washing, rinsing, drying)?: Total Help from another person to put on and taking off regular upper body clothing?: A Lot Help from another person to put on and taking off regular lower body clothing?: Total 6 Click Score: 9   End of Session Equipment Utilized During Treatment: Gait belt Nurse Communication: Mobility status  Activity Tolerance: Patient tolerated treatment well Patient left: in chair;with call bell/phone within reach;with chair alarm set;with family/visitor present  OT Visit Diagnosis: Unsteadiness on feet (R26.81);Other abnormalities of gait and mobility (R26.89);Muscle weakness (generalized) (M62.81);Repeated falls (R29.6);Other symptoms and signs involving cognitive function                Time: 1429-1449 OT Time Calculation (min): 20 min Charges:  OT General Charges $OT Visit: 1 Visit OT Evaluation $OT Eval Moderate Complexity: 1 Mod  Manraj Yeo, OT/L   Acute OT Clinical Specialist Acute Rehabilitation Services Pager [phone] Office [phone]   [facility] 12/18/2022, 3:03 PM

## 2022-12-18 NOTE — Progress Notes (Signed)
Pt. Off unit around 1205 to MRI with Transport. No C/O pain, transfer self from chair to Bed, A&O x 2.

## 2022-12-18 NOTE — Progress Notes (Signed)
Pt. Was found on the floor at 1852, S/P pulling posy belt off, jumped out of chair, walk around foot of bed and landed on the floor. No new neuro changes noted. VS and neuro checks WNL, no C/O pain. MDOC and family notified and no new orders.

## 2022-12-18 NOTE — Plan of Care (Signed)

## 2022-12-19 DIAGNOSIS — Z79899 Other long term (current) drug therapy: Secondary | ICD-10-CM | POA: Diagnosis not present

## 2022-12-19 DIAGNOSIS — I4819 Other persistent atrial fibrillation: Secondary | ICD-10-CM | POA: Diagnosis present

## 2022-12-19 DIAGNOSIS — F319 Bipolar disorder, unspecified: Secondary | ICD-10-CM | POA: Diagnosis present

## 2022-12-19 DIAGNOSIS — R402222 Coma scale, best verbal response, incomprehensible words, at arrival to emergency department: Secondary | ICD-10-CM | POA: Diagnosis present

## 2022-12-19 DIAGNOSIS — I2510 Atherosclerotic heart disease of native coronary artery without angina pectoris: Secondary | ICD-10-CM | POA: Diagnosis present

## 2022-12-19 DIAGNOSIS — F411 Generalized anxiety disorder: Secondary | ICD-10-CM | POA: Diagnosis not present

## 2022-12-19 DIAGNOSIS — I672 Cerebral atherosclerosis: Secondary | ICD-10-CM | POA: Diagnosis not present

## 2022-12-19 DIAGNOSIS — R456 Violent behavior: Secondary | ICD-10-CM | POA: Diagnosis present

## 2022-12-19 DIAGNOSIS — Z7401 Bed confinement status: Secondary | ICD-10-CM | POA: Diagnosis not present

## 2022-12-19 DIAGNOSIS — R739 Hyperglycemia, unspecified: Secondary | ICD-10-CM | POA: Diagnosis not present

## 2022-12-19 DIAGNOSIS — R4182 Altered mental status, unspecified: Secondary | ICD-10-CM | POA: Diagnosis not present

## 2022-12-19 DIAGNOSIS — M25559 Pain in unspecified hip: Secondary | ICD-10-CM | POA: Diagnosis not present

## 2022-12-19 DIAGNOSIS — I482 Chronic atrial fibrillation, unspecified: Secondary | ICD-10-CM | POA: Diagnosis not present

## 2022-12-19 DIAGNOSIS — S0990XA Unspecified injury of head, initial encounter: Secondary | ICD-10-CM | POA: Diagnosis not present

## 2022-12-19 DIAGNOSIS — R1909 Other intra-abdominal and pelvic swelling, mass and lump: Secondary | ICD-10-CM | POA: Diagnosis not present

## 2022-12-19 DIAGNOSIS — F03918 Unspecified dementia, unspecified severity, with other behavioral disturbance: Secondary | ICD-10-CM | POA: Diagnosis not present

## 2022-12-19 DIAGNOSIS — Z515 Encounter for palliative care: Secondary | ICD-10-CM | POA: Diagnosis not present

## 2022-12-19 DIAGNOSIS — S3993XA Unspecified injury of pelvis, initial encounter: Secondary | ICD-10-CM | POA: Diagnosis not present

## 2022-12-19 DIAGNOSIS — R051 Acute cough: Secondary | ICD-10-CM | POA: Diagnosis not present

## 2022-12-19 DIAGNOSIS — Y92129 Unspecified place in nursing home as the place of occurrence of the external cause: Secondary | ICD-10-CM | POA: Diagnosis not present

## 2022-12-19 DIAGNOSIS — I4891 Unspecified atrial fibrillation: Secondary | ICD-10-CM | POA: Diagnosis not present

## 2022-12-19 DIAGNOSIS — R1903 Right lower quadrant abdominal swelling, mass and lump: Secondary | ICD-10-CM | POA: Diagnosis present

## 2022-12-19 DIAGNOSIS — S060X0A Concussion without loss of consciousness, initial encounter: Secondary | ICD-10-CM | POA: Diagnosis present

## 2022-12-19 DIAGNOSIS — R062 Wheezing: Secondary | ICD-10-CM | POA: Diagnosis not present

## 2022-12-19 DIAGNOSIS — R402112 Coma scale, eyes open, never, at arrival to emergency department: Secondary | ICD-10-CM | POA: Diagnosis present

## 2022-12-19 DIAGNOSIS — R269 Unspecified abnormalities of gait and mobility: Secondary | ICD-10-CM | POA: Diagnosis not present

## 2022-12-19 DIAGNOSIS — I517 Cardiomegaly: Secondary | ICD-10-CM | POA: Diagnosis not present

## 2022-12-19 DIAGNOSIS — F02B18 Dementia in other diseases classified elsewhere, moderate, with other behavioral disturbance: Secondary | ICD-10-CM | POA: Diagnosis not present

## 2022-12-19 DIAGNOSIS — F339 Major depressive disorder, recurrent, unspecified: Secondary | ICD-10-CM | POA: Diagnosis not present

## 2022-12-19 DIAGNOSIS — R1310 Dysphagia, unspecified: Secondary | ICD-10-CM | POA: Diagnosis present

## 2022-12-19 DIAGNOSIS — Z91040 Latex allergy status: Secondary | ICD-10-CM | POA: Diagnosis not present

## 2022-12-19 DIAGNOSIS — D631 Anemia in chronic kidney disease: Secondary | ICD-10-CM | POA: Diagnosis present

## 2022-12-19 DIAGNOSIS — M6281 Muscle weakness (generalized): Secondary | ICD-10-CM | POA: Diagnosis not present

## 2022-12-19 DIAGNOSIS — Z96652 Presence of left artificial knee joint: Secondary | ICD-10-CM | POA: Diagnosis not present

## 2022-12-19 DIAGNOSIS — H00013 Hordeolum externum right eye, unspecified eyelid: Secondary | ICD-10-CM | POA: Diagnosis not present

## 2022-12-19 DIAGNOSIS — F419 Anxiety disorder, unspecified: Secondary | ICD-10-CM | POA: Diagnosis not present

## 2022-12-19 DIAGNOSIS — Z7901 Long term (current) use of anticoagulants: Secondary | ICD-10-CM | POA: Diagnosis not present

## 2022-12-19 DIAGNOSIS — F0280 Dementia in other diseases classified elsewhere without behavioral disturbance: Secondary | ICD-10-CM | POA: Diagnosis not present

## 2022-12-19 DIAGNOSIS — Z043 Encounter for examination and observation following other accident: Secondary | ICD-10-CM | POA: Diagnosis not present

## 2022-12-19 DIAGNOSIS — Z8673 Personal history of transient ischemic attack (TIA), and cerebral infarction without residual deficits: Secondary | ICD-10-CM | POA: Diagnosis not present

## 2022-12-19 DIAGNOSIS — Z96641 Presence of right artificial hip joint: Secondary | ICD-10-CM | POA: Diagnosis not present

## 2022-12-19 DIAGNOSIS — I959 Hypotension, unspecified: Secondary | ICD-10-CM | POA: Diagnosis not present

## 2022-12-19 DIAGNOSIS — I10 Essential (primary) hypertension: Secondary | ICD-10-CM | POA: Diagnosis present

## 2022-12-19 DIAGNOSIS — J189 Pneumonia, unspecified organism: Secondary | ICD-10-CM | POA: Diagnosis not present

## 2022-12-19 DIAGNOSIS — N182 Chronic kidney disease, stage 2 (mild): Secondary | ICD-10-CM | POA: Diagnosis not present

## 2022-12-19 DIAGNOSIS — R296 Repeated falls: Secondary | ICD-10-CM | POA: Diagnosis present

## 2022-12-19 DIAGNOSIS — F0390 Unspecified dementia without behavioral disturbance: Secondary | ICD-10-CM | POA: Diagnosis not present

## 2022-12-19 DIAGNOSIS — L708 Other acne: Secondary | ICD-10-CM | POA: Diagnosis not present

## 2022-12-19 DIAGNOSIS — I6782 Cerebral ischemia: Secondary | ICD-10-CM | POA: Diagnosis not present

## 2022-12-19 DIAGNOSIS — Z7189 Other specified counseling: Secondary | ICD-10-CM | POA: Diagnosis not present

## 2022-12-19 DIAGNOSIS — F0283 Dementia in other diseases classified elsewhere, unspecified severity, with mood disturbance: Secondary | ICD-10-CM | POA: Diagnosis present

## 2022-12-19 DIAGNOSIS — S0003XA Contusion of scalp, initial encounter: Secondary | ICD-10-CM | POA: Diagnosis not present

## 2022-12-19 DIAGNOSIS — I469 Cardiac arrest, cause unspecified: Secondary | ICD-10-CM | POA: Diagnosis not present

## 2022-12-19 DIAGNOSIS — M1612 Unilateral primary osteoarthritis, left hip: Secondary | ICD-10-CM | POA: Diagnosis not present

## 2022-12-19 DIAGNOSIS — G309 Alzheimer's disease, unspecified: Secondary | ICD-10-CM | POA: Diagnosis present

## 2022-12-19 DIAGNOSIS — S2241XA Multiple fractures of ribs, right side, initial encounter for closed fracture: Secondary | ICD-10-CM | POA: Diagnosis not present

## 2022-12-19 DIAGNOSIS — R41841 Cognitive communication deficit: Secondary | ICD-10-CM | POA: Diagnosis not present

## 2022-12-19 DIAGNOSIS — E539 Vitamin B deficiency, unspecified: Secondary | ICD-10-CM | POA: Diagnosis not present

## 2022-12-19 DIAGNOSIS — R410 Disorientation, unspecified: Secondary | ICD-10-CM | POA: Diagnosis not present

## 2022-12-19 DIAGNOSIS — S060XAA Concussion with loss of consciousness status unknown, initial encounter: Secondary | ICD-10-CM | POA: Diagnosis present

## 2022-12-19 DIAGNOSIS — F0150 Vascular dementia without behavioral disturbance: Secondary | ICD-10-CM | POA: Diagnosis not present

## 2022-12-19 DIAGNOSIS — R059 Cough, unspecified: Secondary | ICD-10-CM | POA: Diagnosis not present

## 2022-12-19 DIAGNOSIS — T17308A Unspecified foreign body in larynx causing other injury, initial encounter: Secondary | ICD-10-CM | POA: Diagnosis not present

## 2022-12-19 DIAGNOSIS — R531 Weakness: Secondary | ICD-10-CM | POA: Diagnosis not present

## 2022-12-19 DIAGNOSIS — S2249XA Multiple fractures of ribs, unspecified side, initial encounter for closed fracture: Secondary | ICD-10-CM | POA: Diagnosis not present

## 2022-12-19 DIAGNOSIS — W19XXXA Unspecified fall, initial encounter: Secondary | ICD-10-CM | POA: Diagnosis present

## 2022-12-19 DIAGNOSIS — F03C18 Unspecified dementia, severe, with other behavioral disturbance: Secondary | ICD-10-CM | POA: Diagnosis not present

## 2022-12-19 DIAGNOSIS — R1312 Dysphagia, oropharyngeal phase: Secondary | ICD-10-CM | POA: Diagnosis not present

## 2022-12-19 DIAGNOSIS — R402362 Coma scale, best motor response, obeys commands, at arrival to emergency department: Secondary | ICD-10-CM | POA: Diagnosis present

## 2022-12-19 DIAGNOSIS — G20A1 Parkinson's disease without dyskinesia, without mention of fluctuations: Secondary | ICD-10-CM | POA: Diagnosis not present

## 2022-12-19 DIAGNOSIS — S199XXA Unspecified injury of neck, initial encounter: Secondary | ICD-10-CM | POA: Diagnosis not present

## 2022-12-19 DIAGNOSIS — F3112 Bipolar disorder, current episode manic without psychotic features, moderate: Secondary | ICD-10-CM | POA: Diagnosis not present

## 2022-12-19 DIAGNOSIS — S2242XA Multiple fractures of ribs, left side, initial encounter for closed fracture: Secondary | ICD-10-CM | POA: Diagnosis present

## 2022-12-19 DIAGNOSIS — D6489 Other specified anemias: Secondary | ICD-10-CM | POA: Diagnosis not present

## 2022-12-19 DIAGNOSIS — G4733 Obstructive sleep apnea (adult) (pediatric): Secondary | ICD-10-CM | POA: Diagnosis present

## 2022-12-19 DIAGNOSIS — R1319 Other dysphagia: Secondary | ICD-10-CM | POA: Diagnosis not present

## 2022-12-19 DIAGNOSIS — R451 Restlessness and agitation: Secondary | ICD-10-CM | POA: Diagnosis not present

## 2022-12-19 DIAGNOSIS — R590 Localized enlarged lymph nodes: Secondary | ICD-10-CM | POA: Diagnosis not present

## 2022-12-19 DIAGNOSIS — E785 Hyperlipidemia, unspecified: Secondary | ICD-10-CM | POA: Diagnosis present

## 2022-12-19 DIAGNOSIS — G2581 Restless legs syndrome: Secondary | ICD-10-CM | POA: Diagnosis not present

## 2022-12-19 DIAGNOSIS — D649 Anemia, unspecified: Secondary | ICD-10-CM | POA: Diagnosis not present

## 2022-12-19 DIAGNOSIS — R9431 Abnormal electrocardiogram [ECG] [EKG]: Secondary | ICD-10-CM | POA: Diagnosis not present

## 2022-12-19 DIAGNOSIS — I700 Atherosclerosis of aorta: Secondary | ICD-10-CM | POA: Diagnosis not present

## 2022-12-19 LAB — CBC
HCT: 32.8 % — ABNORMAL LOW (ref 39.0–52.0)
Hemoglobin: 10.7 g/dL — ABNORMAL LOW (ref 13.0–17.0)
MCH: 31.3 pg (ref 26.0–34.0)
MCHC: 32.6 g/dL (ref 30.0–36.0)
MCV: 95.9 fL (ref 80.0–100.0)
Platelets: 122 10*3/uL — ABNORMAL LOW (ref 150–400)
RBC: 3.42 MIL/uL — ABNORMAL LOW (ref 4.22–5.81)
RDW: 13.3 % (ref 11.5–15.5)
WBC: 4.7 10*3/uL (ref 4.0–10.5)
nRBC: 0 % (ref 0.0–0.2)

## 2022-12-19 LAB — T3, FREE: T3, Free: 2.7 pg/mL (ref 2.0–4.4)

## 2022-12-19 NOTE — Plan of Care (Signed)
  Problem: Education: Goal: Knowledge of General Education information will improve Description Including pain rating scale, medication(s)/side effects and non-pharmacologic comfort measures Outcome: Progressing   

## 2022-12-19 NOTE — Plan of Care (Signed)

## 2022-12-19 NOTE — Discharge Summary (Signed)
5      Physician Discharge Summary  [name] [name] XBJ:[id number] DOB: August 31, 1945  PCP: Merri Brunette, MD  Admitted from: Home Discharged to: SNF  Admit date: 12/17/2022 Discharge date: 12/19/2022  Recommendations for Outpatient Follow-up:    Follow-up Information     MD at SNF Follow up.   Why: To be seen in 3 to 4 days with repeat labs (CBC & BMP).        Merri Brunette, MD Follow up.   Specialty: Internal Medicine Why: To be seen upon DC from SNF. Contact information: [address] [place] [address] [phone]                  Home Health: None    Equipment/Devices: TBD at SNF    Discharge Condition: Improved and stable.   Code Status: Full Code Diet recommendation:  Discharge Diet Orders (From admission, onward)     Start     Ordered   12/19/22 0000  Diet - low sodium heart healthy        12/19/22 1147             Discharge Diagnoses:  Principal Problem:   Generalized weakness Active Problems:   Chronic atrial fibrillation (HCC)   Essential hypertension   Bipolar disorder (HCC)   Hyperlipidemia   Obstructive sleep apnea   Fall at home, initial encounter   Anemia of chronic disease   Brief Summary: 77 year old married male, PMH of Alzheimer's dementia, chronic A-fib on Xarelto, HTN, HLD, OSA on nocturnal CPAP, bipolar disorder, anemia of chronic disease, recent travel and hospitalization in [place] after he sustained a ground-level fall on 12/12/2022, no reported head injury or LOC, laid on the hotel room floor for approximately 12 hours, subsequently hospitalized for generalized weakness and ambulatory dysfunction, where CT head showed an age-indeterminate right frontal lobe hypodensity, they voluntarily underwent discharge on 12/17/2022 and presented to [facility] for further evaluation and management.  Admitted for evaluation and management of generalized weakness, ambulatory dysfunction and CT brain  abnormality at OSH.     Assessment & Plan:   Generalized weakness and ambulatory dysfunction/mechanical fall and ongoing fall risk: MRI brain with no acute abnormality.  Shows moderate small vessel disease and multiple old right-sided infarcts. Suspect that this is due to very advanced age, advanced dementia, reported tremors versus?  Parkinson's disease and physical deconditioning.  No obvious metabolic or infectious etiology noted..  Mildly orthostatic yesterday morning with SBP dropping from 148 lying to 122 standing after 3 minutes which could certainly contribute.  Encourage oral hydration. Depakote level less than 10.  Chest x-ray negative.  UA not suggestive of UTI. PT evaluated and recommend SNF.  TOC consulted.   Abnormal TFTs TSH 5.182, free T4: 1.48.  Clinically does not appear hyper or hypothyroid. ?  Sick euthyroid. Recommend repeating a full TFTs in 4 to 6 weeks.   Chronic atrial fibrillation with secondary hypercoagulable state Controlled ventricular rate on telemetry.  DC telemetry to minimize patient agitation Continue home Xarelto.  However if increased fall risk, this would be detrimental and will need to be reassessed regarding continuation based on risk versus benefit.  This was discussed in detail with patient's daughter yesterday and she verbalized understanding.   Essential hypertension: Controlled on home dose of lisinopril 10 Mg daily   Hyperlipidemia: Continue home dose of statins   OSA on nightly CPAP Continue CPAP   Bipolar disorder Continue Depakote and Lamictal.  Depakote level is  low,?  Compliance.  Lamictal level pending and can be followed up at SNF.  If his mental status changes i.e. agitation etc. then could consider increasing Depakote dose and monitoring LFTs and CBCs closely.   Dementia, possibly Alzheimer's/vascular Continue Namenda, Aricept and may be on Depakote for behavioral issues.   Tremors versus?  Parkinson's disease Continue  Sinemet.  May have Parkinson's plus syndrome.  However daughter states that he is on Sinemet due to tremors.   Anemia and thrombocytopenia Chronic and stable.   Body mass index is 24.77 kg/m.  Consultants:    None   Procedures:    None   Discharge Instructions  Discharge Instructions     Call MD for:  difficulty breathing, headache or visual disturbances   Complete by: As directed    Call MD for:  extreme fatigue   Complete by: As directed    Call MD for:  persistant dizziness or light-headedness   Complete by: As directed    Call MD for:  persistant nausea and vomiting   Complete by: As directed    Call MD for:  severe uncontrolled pain   Complete by: As directed    Call MD for:  temperature >100.4   Complete by: As directed    Diet - low sodium heart healthy   Complete by: As directed    Increase activity slowly   Complete by: As directed         Medication List     STOP taking these medications    tamsulosin 0.4 MG Caps capsule Commonly known as: FLOMAX       TAKE these medications    atorvastatin 40 MG tablet Commonly known as: LIPITOR Take 40 mg by mouth every evening.   Azelaic Acid 15 % gel Apply 1 Application topically 2 (two) times daily.   b complex vitamins tablet Take 1 tablet by mouth daily.   carbidopa-levodopa 25-250 MG tablet Commonly known as: SINEMET IR Take 1 tablet by mouth 2 (two) times daily.   divalproex 250 MG DR tablet Commonly known as: DEPAKOTE Take 1 tablet by mouth every evening.   donepezil 10 MG tablet Commonly known as: ARICEPT Take 1 tablet (10 mg total) by mouth at bedtime.   folic acid 1 MG tablet Commonly known as: FOLVITE Take 1 mg by mouth at bedtime.   lamoTRIgine 150 MG tablet Commonly known as: LAMICTAL Take 150 mg by mouth 2 (two) times daily.   lisinopril 10 MG tablet Commonly known as: ZESTRIL Take 1 tablet (10 mg total) by mouth every evening.   memantine 10 MG tablet Commonly known as:  NAMENDA Take 10 mg by mouth 2 (two) times daily.   mirtazapine 15 MG tablet Commonly known as: REMERON Take 15 mg by mouth at bedtime.   Repatha SureClick 140 MG/ML Soaj Generic drug: Evolocumab Inject 140 mg into the skin every 14 (fourteen) days.   rivaroxaban 20 MG Tabs tablet Commonly known as: XARELTO Take 1 tablet (20 mg total) by mouth daily.       Allergies  Allergen Reactions   Latex Rash and Other (See Comments)    Reaction to knee wraps      Procedures/Studies: MR BRAIN WO CONTRAST  Result Date: 12/18/2022 CLINICAL DATA:  Stroke, follow up age-indeterminant hypodensity on recent CT head performed at outside hospital; no acute focal neuro deficits. EXAM: MRI HEAD WITHOUT CONTRAST TECHNIQUE: Multiplanar, multiecho pulse sequences of the brain and surrounding structures were obtained without intravenous contrast. COMPARISON:  MRI brain 02/10/2016.  Head CT 06/25/2022. FINDINGS: Brain: No acute infarct or hemorrhage. Background of moderate chronic small-vessel disease with old infarcts in the posterior right frontal lobe, inferior right parietal lobe and right midbrain/cerebral peduncle. No hydrocephalus or extra-axial collection. No mass or midline shift. Vascular: Normal flow voids. Skull and upper cervical spine: Normal marrow signal. Sinuses/Orbits: No acute findings. Other: None. IMPRESSION: 1. No acute intracranial process. 2. Background of moderate chronic small-vessel disease with old infarcts in the posterior right frontal lobe, inferior right parietal lobe and right midbrain/cerebral peduncle. Electronically Signed   By: Orvan Falconer M.D.   On: 12/18/2022 14:04   DG Chest Port 1 View  Result Date: 12/18/2022 CLINICAL DATA:  Fall, generalized weakness EXAM: PORTABLE CHEST 1 VIEW COMPARISON:  06/25/2022 FINDINGS: Loop recorder projects over the left chest. Cardiomegaly. No confluent opacities, effusions or edema. No acute bony abnormality. IMPRESSION:  Cardiomegaly.  No active disease. Electronically Signed   By: Charlett Nose M.D.   On: 12/18/2022 01:11     Subjective: Pleasantly confused.  Oriented only to self.  Still tells me that he is not [place].  No pain reported.  As per discussion with patient's RN today, same RN who had him yesterday, sustained a fall around 7 PM last night when he got out of the chair.  Did not have any neurological changes.  Vital signs and neurochecks were normal and no pain reported.  RN reported to provider on-call and updated family.  Since then, no acute changes  Discharge Exam:  Vitals:   12/19/22 0115 12/19/22 0508 12/19/22 0747 12/19/22 1112  BP: 131/73 (!) 159/81 (!) 165/83 132/66  Pulse: 88 65 78 94  Resp:  17 17 18   Temp: 97.6 F (36.4 C) (!) 97.3 F (36.3 C) 98.3 F (36.8 C) 98.5 F (36.9 C)  TempSrc: Oral Oral Oral Oral  SpO2: 96% 98% 96% 95%  Weight:      Height:        General exam: Elderly male, moderately built and frail sitting up comfortably in chair eating breakfast this morning. Respiratory system: Clear to auscultation. Respiratory effort normal. Cardiovascular system: S1 & S2 heard, RRR. No JVD, murmurs, rubs, gallops or clicks. No pedal edema.  Taken off telemetry yesterday. Gastrointestinal system: Abdomen is nondistended, soft and nontender. No organomegaly or masses felt. Normal bowel sounds heard. Central nervous system: Alert and oriented only to self. No focal neurological deficits. Extremities: Symmetric 5 x 5 power. Skin: No rashes, lesions or ulcers.  Superficial scratch on the left lower lateral distal forearm.  Old appearing bruising on right medial proximal leg/shin. Psychiatry: Judgement and insight impaired. Mood & affect pleasantly confused.     The results of significant diagnostics from this hospitalization (including imaging, microbiology, ancillary and laboratory) are listed below for reference.     Microbiology: No results found for this or any  previous visit (from the past 240 hour(s)).   Labs: CBC: Recent Labs  Lab 12/17/22 1725 12/18/22 0458 12/19/22 0804  WBC 5.5 5.1 4.7  NEUTROABS 4.2 3.5  --   HGB 12.1* 10.8* 10.7*  HCT 37.9* 33.0* 32.8*  MCV 97.4 95.7 95.9  PLT 105* 95* 122*    Basic Metabolic Panel: Recent Labs  Lab 12/17/22 1725 12/18/22 0458  NA 141 143  K 3.7 3.7  CL 104 105  CO2 25 25  GLUCOSE 107* 99  BUN 19 17  CREATININE 1.04 1.00  CALCIUM 9.4 9.7  MG 2.1 1.8  Liver Function Tests: Recent Labs  Lab 12/17/22 1725 12/18/22 0458  AST 33 33  ALT 34 8  ALKPHOS 87 78  BILITOT 0.9 1.0  PROT 7.0 6.5  ALBUMIN 3.6 3.2*    CBG: Recent Labs  Lab 12/17/22 1731  GLUCAP 92    Thyroid function studies Recent Labs    12/17/22 1735  TSH 5.182*    Anemia work up Recent Labs    12/17/22 1725  VITAMINB12 945*    Urinalysis    Component Value Date/Time   COLORURINE AMBER (A) 12/17/2022 2127   APPEARANCEUR CLEAR 12/17/2022 2127   LABSPEC 1.028 12/17/2022 2127   PHURINE 5.0 12/17/2022 2127   GLUCOSEU NEGATIVE 12/17/2022 2127   HGBUR NEGATIVE 12/17/2022 2127   BILIRUBINUR NEGATIVE 12/17/2022 2127   KETONESUR 20 (A) 12/17/2022 2127   PROTEINUR 30 (A) 12/17/2022 2127   UROBILINOGEN 0.2 03/17/2013 2048   NITRITE NEGATIVE 12/17/2022 2127   LEUKOCYTESUR NEGATIVE 12/17/2022 2127      Time coordinating discharge: 25 minutes  SIGNED:  Marcellus Scott, MD,  FACP, [facility], [facility], [facility]   [facility] Physician Advisor [place]     To contact the attending provider between 7A-7P or the covering provider during after hours 7P-7A, please log into the web site [email or web address] and access using universal [place] password for that web site. If you do not have the password, please call the hospital operator.

## 2022-12-19 NOTE — TOC Transition Note (Signed)
Transition of Care [facility]) - CM/SW Discharge Note   Patient Details  Name: [name] [name] MRN: [id number] Date of Birth: 20-Aug-1945  Transition of Care [facility]) CM/SW Contact:  Plummer Matich Felipa Emory, Student-Social Work Phone Number: 12/19/2022, 1:48 PM   Clinical Narrative:   MSW Student received insurance approval for patient to admit to [facility]. MSW Student confirmed with MD that patient is medically stable for discharge. MSW Student notified patients wife [name] and they are in agreement with discharge. MSW confirmed bed is available at [facility]. Transport arranged with wife [name] for transport to [facility].   Number to call report- [phone] [id number]   Final next level of care: Skilled Nursing Facility Barriers to Discharge: Barriers Resolved   Patient Goals and CMS Choice CMS Medicare.gov Compare Post Acute Care list provided to:: Patient Represenative (must comment) Choice offered to / list presented to : Adult Children  Discharge Placement                Patient chooses bed at: [facility] Patient to be transferred to facility by: Family Name of family member notified: [name] Patient and family notified of of transfer: 12/19/22  Discharge Plan and Services Additional resources added to the After Visit Summary for       Post Acute Care Choice: Skilled Nursing Facility                               Social Determinants of Health (SDOH) Interventions SDOH Screenings   Food Insecurity: No Food Insecurity (12/17/2022)  Housing: Low Risk  (12/17/2022)  Transportation Needs: No Transportation Needs (12/17/2022)  Utilities: Not At Risk (12/17/2022)  Tobacco Use: Medium Risk (12/17/2022)     Readmission Risk Interventions     No data to display

## 2022-12-19 NOTE — Discharge Instructions (Signed)

## 2022-12-19 NOTE — Plan of Care (Signed)
  Problem: Education: Goal: Knowledge of General Education information will improve Description: Including pain rating scale, medication(s)/side effects and non-pharmacologic comfort measures 12/19/2022 1649 by Beryle Flock, RN Outcome: Adequate for Discharge 12/19/2022 1154 by Beryle Flock, RN Outcome: Progressing

## 2022-12-19 NOTE — Progress Notes (Signed)
[employer]  called and report given to Truitt Leep, Charity fundraiser. Pt. Transported to wife's car, A&O to self and no C/O pain.

## 2022-12-19 NOTE — TOC Progression Note (Signed)
Transition of Care [facility]) - Progression Note    Patient Details  Name: [name] [name] MRN: [id number] Date of Birth: [birth year]  Transition of Care [facility]) CM/SW Contact  Baldemar Lenis, [place] Phone Number: 12/19/2022, 11:06 AM  Clinical Narrative:   CSW received insurance approval for patient to admit to SNF, wife will provide transportation. CSW updated MD.    Expected Discharge Plan: Skilled Nursing Facility Barriers to Discharge: SNF Pending discharge summary, SNF Pending discharge orders  Expected Discharge Plan and Services     Post Acute Care Choice: Skilled Nursing Facility Living arrangements for the past 2 months: Single Family Home                                       Social Determinants of Health (SDOH) Interventions SDOH Screenings   Food Insecurity: No Food Insecurity (12/17/2022)  Housing: Low Risk  (12/17/2022)  Transportation Needs: No Transportation Needs (12/17/2022)  Utilities: Not At Risk (12/17/2022)  Tobacco Use: Medium Risk (12/17/2022)    Readmission Risk Interventions     No data to display

## 2022-12-19 NOTE — H&P (Shared)
[employer]  called and report given to Truitt Leep, RN

## 2022-12-20 DIAGNOSIS — F319 Bipolar disorder, unspecified: Secondary | ICD-10-CM | POA: Diagnosis not present

## 2022-12-20 DIAGNOSIS — R051 Acute cough: Secondary | ICD-10-CM | POA: Diagnosis not present

## 2022-12-20 DIAGNOSIS — I482 Chronic atrial fibrillation, unspecified: Secondary | ICD-10-CM | POA: Diagnosis not present

## 2022-12-20 DIAGNOSIS — I10 Essential (primary) hypertension: Secondary | ICD-10-CM | POA: Diagnosis not present

## 2022-12-20 DIAGNOSIS — E785 Hyperlipidemia, unspecified: Secondary | ICD-10-CM | POA: Diagnosis not present

## 2022-12-20 DIAGNOSIS — R1310 Dysphagia, unspecified: Secondary | ICD-10-CM | POA: Diagnosis not present

## 2022-12-20 DIAGNOSIS — F0280 Dementia in other diseases classified elsewhere without behavioral disturbance: Secondary | ICD-10-CM | POA: Diagnosis not present

## 2022-12-20 DIAGNOSIS — R531 Weakness: Secondary | ICD-10-CM | POA: Diagnosis not present

## 2022-12-20 DIAGNOSIS — G2581 Restless legs syndrome: Secondary | ICD-10-CM | POA: Diagnosis not present

## 2022-12-21 DIAGNOSIS — R1310 Dysphagia, unspecified: Secondary | ICD-10-CM | POA: Diagnosis not present

## 2022-12-21 DIAGNOSIS — R059 Cough, unspecified: Secondary | ICD-10-CM | POA: Diagnosis not present

## 2022-12-21 DIAGNOSIS — F0280 Dementia in other diseases classified elsewhere without behavioral disturbance: Secondary | ICD-10-CM | POA: Diagnosis not present

## 2022-12-21 DIAGNOSIS — R531 Weakness: Secondary | ICD-10-CM | POA: Diagnosis not present

## 2022-12-21 DIAGNOSIS — R062 Wheezing: Secondary | ICD-10-CM | POA: Diagnosis not present

## 2022-12-22 DIAGNOSIS — D649 Anemia, unspecified: Secondary | ICD-10-CM | POA: Diagnosis not present

## 2022-12-22 DIAGNOSIS — R531 Weakness: Secondary | ICD-10-CM | POA: Diagnosis not present

## 2022-12-22 DIAGNOSIS — G309 Alzheimer's disease, unspecified: Secondary | ICD-10-CM | POA: Diagnosis not present

## 2022-12-22 DIAGNOSIS — F0280 Dementia in other diseases classified elsewhere without behavioral disturbance: Secondary | ICD-10-CM | POA: Diagnosis not present

## 2022-12-22 DIAGNOSIS — R739 Hyperglycemia, unspecified: Secondary | ICD-10-CM | POA: Diagnosis not present

## 2022-12-22 DIAGNOSIS — H00013 Hordeolum externum right eye, unspecified eyelid: Secondary | ICD-10-CM | POA: Diagnosis not present

## 2022-12-22 DIAGNOSIS — R059 Cough, unspecified: Secondary | ICD-10-CM | POA: Diagnosis not present

## 2022-12-22 LAB — LAMOTRIGINE LEVEL: Lamotrigine Lvl: 7.2 ug/mL (ref 2.0–20.0)

## 2022-12-23 DIAGNOSIS — R451 Restlessness and agitation: Secondary | ICD-10-CM | POA: Diagnosis not present

## 2022-12-23 DIAGNOSIS — Z79899 Other long term (current) drug therapy: Secondary | ICD-10-CM | POA: Diagnosis not present

## 2022-12-23 DIAGNOSIS — G309 Alzheimer's disease, unspecified: Secondary | ICD-10-CM | POA: Diagnosis not present

## 2022-12-24 ENCOUNTER — Other Ambulatory Visit (HOSPITAL_COMMUNITY): Payer: Self-pay

## 2022-12-24 DIAGNOSIS — F319 Bipolar disorder, unspecified: Secondary | ICD-10-CM | POA: Diagnosis not present

## 2022-12-24 DIAGNOSIS — R531 Weakness: Secondary | ICD-10-CM | POA: Diagnosis not present

## 2022-12-24 DIAGNOSIS — D649 Anemia, unspecified: Secondary | ICD-10-CM | POA: Diagnosis not present

## 2022-12-24 DIAGNOSIS — F0390 Unspecified dementia without behavioral disturbance: Secondary | ICD-10-CM | POA: Diagnosis not present

## 2022-12-24 DIAGNOSIS — G309 Alzheimer's disease, unspecified: Secondary | ICD-10-CM | POA: Diagnosis not present

## 2022-12-24 DIAGNOSIS — E539 Vitamin B deficiency, unspecified: Secondary | ICD-10-CM | POA: Diagnosis not present

## 2022-12-25 ENCOUNTER — Other Ambulatory Visit (HOSPITAL_COMMUNITY): Payer: Self-pay

## 2022-12-25 ENCOUNTER — Other Ambulatory Visit: Payer: Self-pay

## 2022-12-25 DIAGNOSIS — R531 Weakness: Secondary | ICD-10-CM | POA: Diagnosis not present

## 2022-12-25 DIAGNOSIS — L708 Other acne: Secondary | ICD-10-CM | POA: Diagnosis not present

## 2022-12-25 DIAGNOSIS — F0280 Dementia in other diseases classified elsewhere without behavioral disturbance: Secondary | ICD-10-CM | POA: Diagnosis not present

## 2022-12-25 DIAGNOSIS — W19XXXA Unspecified fall, initial encounter: Secondary | ICD-10-CM | POA: Diagnosis not present

## 2022-12-25 MED ORDER — RIVAROXABAN 20 MG PO TABS
20.0000 mg | ORAL_TABLET | Freq: Every day | ORAL | 5 refills | Status: DC
  Filled 2022-12-25: qty 30, 30d supply, fill #0

## 2022-12-25 MED ORDER — LISINOPRIL 10 MG PO TABS
10.0000 mg | ORAL_TABLET | Freq: Every evening | ORAL | 1 refills | Status: DC
  Filled 2022-12-25: qty 30, 30d supply, fill #0

## 2022-12-25 MED ORDER — ATORVASTATIN CALCIUM 40 MG PO TABS
40.0000 mg | ORAL_TABLET | Freq: Every evening | ORAL | 1 refills | Status: DC
  Filled 2022-12-25: qty 30, 30d supply, fill #0

## 2022-12-25 NOTE — Telephone Encounter (Signed)
Prescription refill request for Xarelto received.  Indication: AF Last office visit: 01/16/22  Erlene Quan MD Weight: 88.4kg Age: 77 Scr: 0.90 on 12/21/22  Epic CrCl: 83.22  Based on above findings Xarelto 20mg  daily is the appropriate dose.  Refill approved.  Pt due to see Dr Allyson Sabal 01/12/23.  Message sent to schedulers.

## 2022-12-26 DIAGNOSIS — F0280 Dementia in other diseases classified elsewhere without behavioral disturbance: Secondary | ICD-10-CM | POA: Diagnosis not present

## 2022-12-26 DIAGNOSIS — R1310 Dysphagia, unspecified: Secondary | ICD-10-CM | POA: Diagnosis not present

## 2022-12-26 DIAGNOSIS — T17308A Unspecified foreign body in larynx causing other injury, initial encounter: Secondary | ICD-10-CM | POA: Diagnosis not present

## 2022-12-27 DIAGNOSIS — R1310 Dysphagia, unspecified: Secondary | ICD-10-CM | POA: Diagnosis not present

## 2022-12-28 DIAGNOSIS — F419 Anxiety disorder, unspecified: Secondary | ICD-10-CM | POA: Diagnosis not present

## 2022-12-28 DIAGNOSIS — W19XXXA Unspecified fall, initial encounter: Secondary | ICD-10-CM | POA: Diagnosis not present

## 2022-12-28 DIAGNOSIS — G309 Alzheimer's disease, unspecified: Secondary | ICD-10-CM | POA: Diagnosis not present

## 2022-12-28 DIAGNOSIS — R531 Weakness: Secondary | ICD-10-CM | POA: Diagnosis not present

## 2022-12-28 DIAGNOSIS — R451 Restlessness and agitation: Secondary | ICD-10-CM | POA: Diagnosis not present

## 2022-12-29 DIAGNOSIS — R451 Restlessness and agitation: Secondary | ICD-10-CM | POA: Diagnosis not present

## 2022-12-29 DIAGNOSIS — R531 Weakness: Secondary | ICD-10-CM | POA: Diagnosis not present

## 2022-12-29 DIAGNOSIS — G309 Alzheimer's disease, unspecified: Secondary | ICD-10-CM | POA: Diagnosis not present

## 2022-12-30 DIAGNOSIS — R451 Restlessness and agitation: Secondary | ICD-10-CM | POA: Diagnosis not present

## 2022-12-30 DIAGNOSIS — R531 Weakness: Secondary | ICD-10-CM | POA: Diagnosis not present

## 2022-12-30 DIAGNOSIS — G309 Alzheimer's disease, unspecified: Secondary | ICD-10-CM | POA: Diagnosis not present

## 2022-12-31 DIAGNOSIS — R062 Wheezing: Secondary | ICD-10-CM | POA: Diagnosis not present

## 2022-12-31 DIAGNOSIS — G309 Alzheimer's disease, unspecified: Secondary | ICD-10-CM | POA: Diagnosis not present

## 2022-12-31 DIAGNOSIS — R531 Weakness: Secondary | ICD-10-CM | POA: Diagnosis not present

## 2022-12-31 DIAGNOSIS — R451 Restlessness and agitation: Secondary | ICD-10-CM | POA: Diagnosis not present

## 2023-01-01 ENCOUNTER — Other Ambulatory Visit (HOSPITAL_COMMUNITY): Payer: Self-pay

## 2023-01-01 DIAGNOSIS — D649 Anemia, unspecified: Secondary | ICD-10-CM | POA: Diagnosis not present

## 2023-01-01 DIAGNOSIS — R531 Weakness: Secondary | ICD-10-CM | POA: Diagnosis not present

## 2023-01-01 DIAGNOSIS — G309 Alzheimer's disease, unspecified: Secondary | ICD-10-CM | POA: Diagnosis not present

## 2023-01-01 DIAGNOSIS — N182 Chronic kidney disease, stage 2 (mild): Secondary | ICD-10-CM | POA: Diagnosis not present

## 2023-01-01 DIAGNOSIS — R451 Restlessness and agitation: Secondary | ICD-10-CM | POA: Diagnosis not present

## 2023-01-02 DIAGNOSIS — R531 Weakness: Secondary | ICD-10-CM | POA: Diagnosis not present

## 2023-01-02 DIAGNOSIS — G309 Alzheimer's disease, unspecified: Secondary | ICD-10-CM | POA: Diagnosis not present

## 2023-01-03 ENCOUNTER — Emergency Department (HOSPITAL_COMMUNITY): Payer: Medicare PPO

## 2023-01-03 ENCOUNTER — Encounter (HOSPITAL_COMMUNITY): Payer: Self-pay

## 2023-01-03 ENCOUNTER — Emergency Department (HOSPITAL_COMMUNITY)
Admission: EM | Admit: 2023-01-03 | Discharge: 2023-01-04 | Disposition: A | Discharge status: Home / Self Care | Payer: Medicare PPO | Attending: Emergency Medicine | Admitting: Emergency Medicine

## 2023-01-03 DIAGNOSIS — Z7901 Long term (current) use of anticoagulants: Secondary | ICD-10-CM | POA: Insufficient documentation

## 2023-01-03 DIAGNOSIS — R030 Elevated blood-pressure reading, without diagnosis of hypertension: Secondary | ICD-10-CM

## 2023-01-03 DIAGNOSIS — F03918 Unspecified dementia, unspecified severity, with other behavioral disturbance: Secondary | ICD-10-CM | POA: Diagnosis not present

## 2023-01-03 DIAGNOSIS — F0280 Dementia in other diseases classified elsewhere without behavioral disturbance: Secondary | ICD-10-CM | POA: Diagnosis not present

## 2023-01-03 DIAGNOSIS — Z79899 Other long term (current) drug therapy: Secondary | ICD-10-CM | POA: Diagnosis not present

## 2023-01-03 DIAGNOSIS — R456 Violent behavior: Secondary | ICD-10-CM | POA: Diagnosis not present

## 2023-01-03 DIAGNOSIS — R531 Weakness: Secondary | ICD-10-CM | POA: Insufficient documentation

## 2023-01-03 DIAGNOSIS — I517 Cardiomegaly: Secondary | ICD-10-CM | POA: Diagnosis not present

## 2023-01-03 DIAGNOSIS — R451 Restlessness and agitation: Secondary | ICD-10-CM | POA: Diagnosis not present

## 2023-01-03 DIAGNOSIS — I672 Cerebral atherosclerosis: Secondary | ICD-10-CM | POA: Diagnosis not present

## 2023-01-03 DIAGNOSIS — I6782 Cerebral ischemia: Secondary | ICD-10-CM | POA: Diagnosis not present

## 2023-01-03 DIAGNOSIS — I10 Essential (primary) hypertension: Secondary | ICD-10-CM | POA: Insufficient documentation

## 2023-01-03 DIAGNOSIS — R4182 Altered mental status, unspecified: Secondary | ICD-10-CM | POA: Diagnosis not present

## 2023-01-03 DIAGNOSIS — I4891 Unspecified atrial fibrillation: Secondary | ICD-10-CM | POA: Diagnosis not present

## 2023-01-03 DIAGNOSIS — G309 Alzheimer's disease, unspecified: Secondary | ICD-10-CM | POA: Diagnosis not present

## 2023-01-03 DIAGNOSIS — R410 Disorientation, unspecified: Secondary | ICD-10-CM | POA: Diagnosis not present

## 2023-01-03 DIAGNOSIS — Z91040 Latex allergy status: Secondary | ICD-10-CM | POA: Insufficient documentation

## 2023-01-03 DIAGNOSIS — F0150 Vascular dementia without behavioral disturbance: Secondary | ICD-10-CM | POA: Diagnosis present

## 2023-01-03 LAB — CBG MONITORING, ED: Glucose-Capillary: 116 mg/dL — ABNORMAL HIGH (ref 70–99)

## 2023-01-03 MED ORDER — HALOPERIDOL LACTATE 5 MG/ML IJ SOLN
INTRAMUSCULAR | Status: AC
  Administered 2023-01-03: 2.5 mg
  Filled 2023-01-03: qty 1

## 2023-01-03 MED ORDER — HALOPERIDOL LACTATE 5 MG/ML IJ SOLN
2.5000 mg | Freq: Once | INTRAMUSCULAR | Status: AC
  Administered 2023-01-04: 2.5 mg via INTRAMUSCULAR
  Filled 2023-01-03: qty 1

## 2023-01-03 NOTE — ED Notes (Signed)
Patient is alert and oriented to his name birthday. Patient denies pain and states is "a little tired".

## 2023-01-03 NOTE — ED Provider Notes (Signed)
Handoff from Dr. Adline Mango, hx of Alztheimer's. Increased violence at facility. Was given IM ativan at facility. Has been in facility restraints. Pending head CT and labs. Have psychiatry evaluate.   Physical Exam  BP (!) 155/83   Pulse 93   Temp 97.7 F (36.5 C) (Oral)   Resp 18   SpO2 100%   Physical Exam Vitals and nursing note reviewed.  Constitutional:      General: He is not in acute distress.    Appearance: He is well-developed.  HENT:     Head: Normocephalic and atraumatic.  Eyes:     Conjunctiva/sclera: Conjunctivae normal.  Cardiovascular:     Rate and Rhythm: Normal rate and regular rhythm.     Heart sounds: No murmur heard. Pulmonary:     Effort: Pulmonary effort is normal. No respiratory distress.     Breath sounds: Normal breath sounds.  Abdominal:     Palpations: Abdomen is soft.     Tenderness: There is no abdominal tenderness.  Musculoskeletal:        General: No swelling.     Cervical back: Neck supple.  Skin:    General: Skin is warm and dry.     Capillary Refill: Capillary refill takes less than 2 seconds.  Neurological:     Mental Status: He is alert. Mental status is at baseline. He is disoriented.     Comments: Confused at baseline  Psychiatric:        Mood and Affect: Mood normal.     Procedures  Procedures  ED Course / MDM    Medical Decision Making Amount and/or Complexity of Data Reviewed Labs: ordered. Radiology: ordered.  Risk Prescription drug management.

## 2023-01-03 NOTE — ED Provider Notes (Signed)
[place] EMERGENCY DEPARTMENT AT [facility] Provider Note   CSN: [id number] Arrival date & time: 01/03/23  2055     History  No chief complaint on file.   [name] [name] is a 77 y.o. male with past medical history of Alzheimer's dementia, chronic A-fib on Xarelto, hypertension, hyperlipidemia, OSA on nightly CPAP, bipolar disorder, anemia of chronic disease, recent hospitalization within the past month for generalized weakness who presents to the ED today from his facility for aggression at facility.  Patient has reportedly been aggressive, hitting staff, and wandering into peoples rooms.  He was given IM Ativan at the facility at an unknown time.  He normally requires a wheelchair.  No recent injuries or falls per facility. PRN Haldol and Ativan seen on his facility med list.    The history is provided by the patient, the nursing home, the EMS personnel and medical records.       Home Medications Prior to Admission medications   Medication Sig Start Date End Date Taking? Authorizing Provider  atorvastatin (LIPITOR) 40 MG tablet Take 1 tablet (40 mg total) by mouth every evening. Please call our office to schedule an yearly appointment with Dr. Allyson Sabal for November 2024 before anymore refills. [phone] 12/25/22   Runell Gess, MD  Azelaic Acid 15 % gel Apply 1 Application topically 2 (two) times daily.    [provider]  b complex vitamins tablet Take 1 tablet by mouth daily.    [provider]  carbidopa-levodopa (SINEMET IR) 25-250 MG per tablet Take 1 tablet by mouth 2 (two) times daily.     [provider]  divalproex (DEPAKOTE) 250 MG DR tablet Take 1 tablet by mouth every evening. 10/28/19   [provider]  donepezil (ARICEPT) 10 MG tablet Take 1 tablet (10 mg total) by mouth at bedtime. 08/08/14   Everlena Cooper, Adam R, DO  Evolocumab (REPATHA SURECLICK) 140 MG/ML SOAJ Inject 140 mg into the skin every 14 (fourteen) days.     [provider]  folic acid (FOLVITE) 1 MG tablet Take 1 mg by mouth at bedtime.     [provider]  lamoTRIgine (LAMICTAL) 150 MG tablet Take 150 mg by mouth 2 (two) times daily.    [provider]  lisinopril (ZESTRIL) 10 MG tablet Take 1 tablet (10 mg total) by mouth every evening. Please call our office to schedule an yearly appointment with Dr. Allyson Sabal for November 2024 before anymore refills. [phone]. Thank you 1st attempt 12/25/22   Runell Gess, MD  memantine (NAMENDA) 10 MG tablet Take 10 mg by mouth 2 (two) times daily.     [provider]  mirtazapine (REMERON) 15 MG tablet Take 15 mg by mouth at bedtime.    [provider]  rivaroxaban (XARELTO) 20 MG TABS tablet Take 1 tablet (20 mg total) by mouth daily. 12/25/22   Runell Gess, MD      Allergies    Latex    Review of Systems   Review of Systems unable to obtain 2/2 patient condition  Physical Exam Updated Vital Signs BP (!) 155/83   Pulse 93   Temp 97.7 F (36.5 C) (Oral)   Resp 18   SpO2 100%  Physical Exam Constitutional:      Comments: agitated  HENT:     Head: Normocephalic and atraumatic.     Nose: Nose normal.     Mouth/Throat:     Mouth: Mucous membranes are moist.  Eyes:     Extraocular Movements: Extraocular movements intact.     Pupils: Pupils are equal, round, and reactive to light.  Cardiovascular:     Rate and Rhythm: Normal rate and regular rhythm.     Pulses: Normal pulses.     Heart sounds: Normal heart sounds.  Pulmonary:     Effort: Pulmonary effort is normal.     Breath sounds: Normal breath sounds.  Abdominal:     General: There is no distension.     Palpations: Abdomen is soft.     Tenderness: There is no abdominal tenderness. There is no guarding.  Musculoskeletal:        General: No signs of injury.     Cervical back: Neck supple.     Right lower leg: No edema.     Left lower leg: No edema.  Skin:    General: Skin is  warm.     Capillary Refill: Capillary refill takes less than 2 seconds.  Neurological:     Mental Status: He is alert.     Sensory: No sensory deficit.     Motor: No weakness.     Comments: agitated     ED Results / Procedures / Treatments   Labs (all labs ordered are listed, but only abnormal results are displayed) Labs Reviewed  CBG MONITORING, ED - Abnormal; Notable for the following components:      Result Value   Glucose-Capillary 116 (*)    All other components within normal limits  CBC WITH DIFFERENTIAL/PLATELET  COMPREHENSIVE METABOLIC PANEL  MAGNESIUM  URINALYSIS, ROUTINE W REFLEX MICROSCOPIC  VALPROIC ACID LEVEL    EKG None  Radiology DG Chest Portable 1 View  Result Date: 01/03/2023 CLINICAL DATA:  Altered mental status EXAM: PORTABLE CHEST 1 VIEW COMPARISON:  12/17/2022 FINDINGS: Lungs are clear.  No pleural effusion or pneumothorax. The heart is mildly enlarged, unchanged. IMPRESSION: Stable mild cardiomegaly. No acute cardiopulmonary disease. Electronically Signed   By: Charline Bills M.D.   On: 01/03/2023 22:15    Procedures Procedures    Medications Ordered in ED Medications  haloperidol lactate (HALDOL) injection 2.5 mg (has no administration in time range)  haloperidol lactate (HALDOL) 5 MG/ML injection (2.5 mg  Given 01/03/23 2137)    ED Course/ Medical Decision Making/ A&P                                 Medical Decision Making Amount and/or Complexity of Data Reviewed Labs: ordered. Radiology: ordered.  Risk Prescription drug management.   77 year old male resident of the facility who presents for aggression at his facility.  On arrival here he is hemodynamically stable, afebrile, without signs of systemic illness.  On my initial evaluation he is aggressive with staff and requires multiple people to return him to his room get him into his bed.  If you still allow nursing staff to apply telemetry leads.  Was not redirectable verbally.   Required chemical restraint with 2.5 mg IM haldol and physical restraints (soft wrist and ankle) due to continued attempts to get out of bed.    DDX for his agitation includes behavioral outburst related to underlying cognitive impairment, sepsis or acute infection with infectious encephalopathy, metabolic encephalopathy secondary to hypoglycemia or electrolyte derangement, ACS, stroke.  Evaluated in the ED with labs, EKG, chest x-ray, CT head without contrast. Valproic acid level checked (as this is on his med list from facility).  This workup was in process at time of signout. Plan for medical clearance, behavioral health consult for medication management if workup unrevealing/behavioral etiology suspected. Care handed to oncoming APP. See their note for further detail and MDM.             Final Clinical Impression(s) / ED Diagnoses Final diagnoses:  Agitation    Rx / DC Orders ED Discharge Orders     None         Karmen Stabs, MD 01/03/23 2343    Elayne Snare K, DO 01/03/23 2358

## 2023-01-03 NOTE — ED Triage Notes (Addendum)
Patient brought in via EMS from [facility] center for aggressive behavior. Staff reports the patient has been aggressive, hitting staff, and wandering into peoples room. Patient was given ativan IM (unknown amount unknown time). Patient is normally in a wheelchair. Facility denies injury or falls. History of afib dementia. Takes Xarelto.

## 2023-01-04 ENCOUNTER — Encounter (HOSPITAL_COMMUNITY): Payer: Self-pay | Admitting: Psychiatry

## 2023-01-04 ENCOUNTER — Other Ambulatory Visit: Payer: Self-pay

## 2023-01-04 ENCOUNTER — Emergency Department (HOSPITAL_COMMUNITY): Payer: Medicare PPO

## 2023-01-04 DIAGNOSIS — F03918 Unspecified dementia, unspecified severity, with other behavioral disturbance: Secondary | ICD-10-CM | POA: Diagnosis present

## 2023-01-04 DIAGNOSIS — G309 Alzheimer's disease, unspecified: Secondary | ICD-10-CM | POA: Diagnosis not present

## 2023-01-04 DIAGNOSIS — F0280 Dementia in other diseases classified elsewhere without behavioral disturbance: Secondary | ICD-10-CM | POA: Diagnosis not present

## 2023-01-04 DIAGNOSIS — F0150 Vascular dementia without behavioral disturbance: Secondary | ICD-10-CM | POA: Diagnosis not present

## 2023-01-04 LAB — CBC WITH DIFFERENTIAL/PLATELET
Abs Immature Granulocytes: 0.03 10*3/uL (ref 0.00–0.07)
Basophils Absolute: 0.1 10*3/uL (ref 0.0–0.1)
Basophils Relative: 1 %
Eosinophils Absolute: 0.1 10*3/uL (ref 0.0–0.5)
Eosinophils Relative: 2 %
HCT: 38.5 % — ABNORMAL LOW (ref 39.0–52.0)
Hemoglobin: 12.2 g/dL — ABNORMAL LOW (ref 13.0–17.0)
Immature Granulocytes: 1 %
Lymphocytes Relative: 15 %
Lymphs Abs: 1 10*3/uL (ref 0.7–4.0)
MCH: 30.6 pg (ref 26.0–34.0)
MCHC: 31.7 g/dL (ref 30.0–36.0)
MCV: 96.5 fL (ref 80.0–100.0)
Monocytes Absolute: 0.4 10*3/uL (ref 0.1–1.0)
Monocytes Relative: 6 %
Neutro Abs: 4.8 10*3/uL (ref 1.7–7.7)
Neutrophils Relative %: 75 %
Platelets: 302 10*3/uL (ref 150–400)
RBC: 3.99 MIL/uL — ABNORMAL LOW (ref 4.22–5.81)
RDW: 13.3 % (ref 11.5–15.5)
WBC: 6.4 10*3/uL (ref 4.0–10.5)
nRBC: 0 % (ref 0.0–0.2)

## 2023-01-04 LAB — COMPREHENSIVE METABOLIC PANEL
ALT: 20 U/L (ref 0–44)
AST: 40 U/L (ref 15–41)
Albumin: 3.8 g/dL (ref 3.5–5.0)
Alkaline Phosphatase: 122 U/L (ref 38–126)
Anion gap: 8 (ref 5–15)
BUN: 25 mg/dL — ABNORMAL HIGH (ref 8–23)
CO2: 28 mmol/L (ref 22–32)
Calcium: 10.3 mg/dL (ref 8.9–10.3)
Chloride: 103 mmol/L (ref 98–111)
Creatinine, Ser: 1.33 mg/dL — ABNORMAL HIGH (ref 0.61–1.24)
GFR, Estimated: 54 mL/min — ABNORMAL LOW (ref >60)
Glucose, Bld: 118 mg/dL — ABNORMAL HIGH (ref 70–99)
Potassium: 4.7 mmol/L (ref 3.5–5.1)
Sodium: 139 mmol/L (ref 135–145)
Total Bilirubin: 0.7 mg/dL (ref 0.3–1.2)
Total Protein: 7.1 g/dL (ref 6.5–8.1)

## 2023-01-04 LAB — URINALYSIS, ROUTINE W REFLEX MICROSCOPIC
Bilirubin Urine: NEGATIVE
Glucose, UA: NEGATIVE mg/dL
Hgb urine dipstick: NEGATIVE
Ketones, ur: NEGATIVE mg/dL
Leukocytes,Ua: NEGATIVE
Nitrite: NEGATIVE
Protein, ur: NEGATIVE mg/dL
Specific Gravity, Urine: 1.013 (ref 1.005–1.030)
pH: 7 (ref 5.0–8.0)

## 2023-01-04 LAB — VALPROIC ACID LEVEL: Valproic Acid Lvl: 12 ug/mL — ABNORMAL LOW (ref 50.0–100.0)

## 2023-01-04 LAB — MAGNESIUM: Magnesium: 2.2 mg/dL (ref 1.7–2.4)

## 2023-01-04 MED ORDER — SODIUM CHLORIDE 0.9 % IV BOLUS: 1000.0000 mL | Freq: Once | INTRAVENOUS | Status: DC

## 2023-01-04 NOTE — ED Notes (Signed)
Unable to give pt report at this time.

## 2023-01-04 NOTE — ED Notes (Signed)
Pt report given to [facility].

## 2023-01-04 NOTE — ED Notes (Signed)
In and out cath attempted with paramedic Karoline Caldwell, and [facility], with no success.

## 2023-01-04 NOTE — Discharge Instructions (Signed)
It was our pleasure to provide your ER care today - we hope that you feel better.  Increase your depakote to taking 250 mg in AM, and 500 mg in PM.  Follow up with primary care doctor this coming week for recheck, recheck of blood pressure as it is high today, as well as for your ongoing medication management related to your behavioral symptoms.   Return to ER if worse, new symptoms, fevers, chest pain, trouble breathing, or other emergency concern.

## 2023-01-04 NOTE — Consult Note (Addendum)
BH ED ASSESSMENT   Reason for Consult: Psych Consult, "agitation/aggression"  Referring Physician:   Pete Pelt, PA   Patient Identification: [name] [name] MRN:  [id number] ED Chief Complaint: Dementia with behavioral disturbance [facility])  Diagnosis:  Principal Problem:   Dementia with behavioral disturbance (HCC) Active Problems:   Mixed Alzheimer's and vascular dementia [facility])   ED Assessment Time Calculation: Start Time: 0800 Stop Time: 0846 Total Time in Minutes (Assessment Completion): 46   Subjective:    [name] [name] is a 77 y.o. caucasian male with a past psychiatric history of bipolar affective disorder, unspecified mood disorder, and unspecified depression, with pertinent medical comorbidities that include Alzheimer's and vascular dementia, chronic A-fib on Xarelto, hypertension, hyperlipidemia, OSA on nightly CPAP, and anemia of chronic disease, who presented this encounter by way of EMS from his assisted living facility due to worsening aggressive and inappropriate behavior in the form of hitting at staff who attempt to provide care for him and wandering into other peoples rooms at the facility. Psychiatry was consulted to make medication adjustments to help facilitate a reduction in recent increases in aggression and agitations, as well as inappropriate behaviors that have been of concern. Patient currently is medically cleared per EDP team and voluntary at this time.  HPI:   Patient seen today at the [facility] emergency department for face-to-face psychiatric evaluation.  Upon evaluation, patient engagement largely characterized by atypical and oddly related interpersonal style, absent eye contact, garbled and poor articulation of speech with largely disorganized and irrelevant thought process (outside of superficial answering of questions intermittently), lack of orientation but without concerns for fluctuations in consciousness, poor attentiveness with restricted  affect, and largely limited ability to provide meaningful participation in evaluation, though was appreciably calm and not hyperactive, not aggressive, and/or presenting with agitations.   Patient was only oriented to self during evaluation, stated that he was in [place] currently, the month was 1920, that the year was June, and that it was lunch and breakfast time.  Patient did not appear to be presenting with psychotic features, as well as was able to articulate that he was not hearing or seeing things.  Patient endorsed his mood was, "I think I feel pretty good".  Patient endorsed no suicidal or homicidal ideations.  Patient endorsed that he was not in any physical pain and appreciably did not appear in distress.  Patient was attempted to be evaluated using [facility] cognitive assessment, but appreciably had no ability to participate in evaluation, given the progression of the patient's chronic illness course of dementia.  Past Psychiatric History: Bipolar affective disorder, mood disorder, unspecified depression  Risk to Self or Others: Is the patient at risk to self? No Has the patient been a risk to self in the past 6 months? No Has the patient been a risk to self within the distant past? No Is the patient a risk to others? No Has the patient been a risk to others in the past 6 months? No Has the patient been a risk to others within the distant past? No  [place] Scale:  Flowsheet Row ED from 01/03/2023 in [facility] Emergency Department at [facility] ED to Hosp-Admission (Discharged) from 12/17/2022 in [facility] ED from 06/25/2022 in [facility] Emergency Department at [facility]  C-SSRS RISK CATEGORY No Risk No Risk No Risk       Substance Abuse: None endorsed or reported  Past Medical History:  Past Medical History:  Diagnosis Date   Alzheimer disease (HCC)    Anxiety    Arthritis    Atrial fibrillation (HCC)    Chronic   Bipolar disorder  (HCC)    Chronic low back pain    Dementia (HCC)    Depression    Dizziness    Dysrhythmia    a fib   ED (erectile dysfunction)    External hemorrhoids    Fatigue    HLD (hyperlipidemia)    HTN (hypertension)    Hypogonadism in male    Lipoma of skin    Memory loss    Mixed Alzheimer's and vascular dementia (HCC)    Poor balance    Restless legs syndrome (RLS)    Sleep apnea    cpap   Stroke [facility]) 1991   Tremor     Past Surgical History:  Procedure Laterality Date   COLONOSCOPY     KNEE ARTHROPLASTY Left 06/04/2017   Procedure: LEFT TOTAL KNEE ARTHROPLASTY WITH COMPUTER NAVIGATION;  Surgeon: Samson Frederic, MD;  Location: [facility] ORS;  Service: Orthopedics;  Laterality: Left;  NEEDS RNFA   LOOP RECORDER INSERTION N/A 11/25/2021   Procedure: LOOP RECORDER INSERTION;  Surgeon: Duke Salvia, MD;  Location: [facility] INVASIVE CV LAB;  Service: Cardiovascular;  Laterality: N/A;   TONSILLECTOMY AND ADENOIDECTOMY     TOTAL HIP ARTHROPLASTY Right 07/31/2016   Procedure: RIGHT TOTAL HIP ARTHROPLASTY ANTERIOR APPROACH;  Surgeon: Samson Frederic, MD;  Location: [facility] ORS;  Service: Orthopedics;  Laterality: Right;  Requesting RNFA   Family History:  Family History  Problem Relation Age of Onset   Suicidality Unknown    Cancer Unknown    Alcohol abuse Unknown    Cancer Mother    Suicidality Father    Family Psychiatric  History: None endorsed or reported Social History:  Social History   Substance and Sexual Activity  Alcohol Use Yes   Alcohol/week: 1.0 standard drink of alcohol   Types: 1 Shots of liquor per week   Comment: occasional     Social History   Substance and Sexual Activity  Drug Use No    Social History   Socioeconomic History   Marital status: Married    Spouse name: Not on file   Number of children: 3   Years of education: Not on file   Highest education level: Not on file  Occupational History   Occupation: retired  Tobacco Use   Smoking status: Never    Smokeless tobacco: Former    Quit date: 07/23/1968  Vaping Use   Vaping status: Never Used  Substance and Sexual Activity   Alcohol use: Yes    Alcohol/week: 1.0 standard drink of alcohol    Types: 1 Shots of liquor per week    Comment: occasional   Drug use: No   Sexual activity: Yes  Other Topics Concern   Not on file  Social History Narrative   Not on file   Social Determinants of Health   Financial Resource Strain: Not on file  Food Insecurity: No Food Insecurity (12/17/2022)   Hunger Vital Sign    Worried About Running Out of Food in the Last Year: Never true    Ran Out of Food in the Last Year: Never true  Transportation Needs: No Transportation Needs (12/17/2022)   PRAPARE - Administrator, Civil Service (Medical): No    Lack of Transportation (Non-Medical): No  Physical Activity: Not on file  Stress: Not on file  Social Connections:  Not on file   Additional Social History:    Allergies:   Allergies  Allergen Reactions   Latex Rash and Other (See Comments)    Reaction to knee wraps    Labs:  Results for orders placed or performed during the hospital encounter of 01/03/23 (from the past 48 hour(s))  CBG monitoring, ED     Status: Abnormal   Collection Time: 01/03/23  9:05 PM  Result Value Ref Range   Glucose-Capillary 116 (H) 70 - 99 mg/dL    Comment: Glucose reference range applies only to samples taken after fasting for at least 8 hours.  CBC with Differential     Status: Abnormal   Collection Time: 01/03/23 11:48 PM  Result Value Ref Range   WBC 6.4 4.0 - 10.5 K/uL   RBC 3.99 (L) 4.22 - 5.81 MIL/uL   Hemoglobin 12.2 (L) 13.0 - 17.0 g/dL   HCT 16.1 (L) 09.6 - 04.5 %   MCV 96.5 80.0 - 100.0 fL   MCH 30.6 26.0 - 34.0 pg   MCHC 31.7 30.0 - 36.0 g/dL   RDW 40.9 81.1 - 91.4 %   Platelets 302 150 - 400 K/uL   nRBC 0.0 0.0 - 0.2 %   Neutrophils Relative % 75 %   Neutro Abs 4.8 1.7 - 7.7 K/uL   Lymphocytes Relative 15 %   Lymphs Abs 1.0 0.7 -  4.0 K/uL   Monocytes Relative 6 %   Monocytes Absolute 0.4 0.1 - 1.0 K/uL   Eosinophils Relative 2 %   Eosinophils Absolute 0.1 0.0 - 0.5 K/uL   Basophils Relative 1 %   Basophils Absolute 0.1 0.0 - 0.1 K/uL   Immature Granulocytes 1 %   Abs Immature Granulocytes 0.03 0.00 - 0.07 K/uL    Comment: Performed at [facility], [address], [place], [place] [address]  Comprehensive metabolic panel     Status: Abnormal   Collection Time: 01/03/23 11:48 PM  Result Value Ref Range   Sodium 139 135 - 145 mmol/L   Potassium 4.7 3.5 - 5.1 mmol/L   Chloride 103 98 - 111 mmol/L   CO2 28 22 - 32 mmol/L   Glucose, Bld 118 (H) 70 - 99 mg/dL    Comment: Glucose reference range applies only to samples taken after fasting for at least 8 hours.   BUN 25 (H) 8 - 23 mg/dL   Creatinine, Ser 6.21 (H) 0.61 - 1.24 mg/dL   Calcium 30.8 8.9 - 65.7 mg/dL   Total Protein 7.1 6.5 - 8.1 g/dL   Albumin 3.8 3.5 - 5.0 g/dL   AST 40 15 - 41 U/L   ALT 20 0 - 44 U/L   Alkaline Phosphatase 122 38 - 126 U/L   Total Bilirubin 0.7 0.3 - 1.2 mg/dL   GFR, Estimated 54 (L) >60 mL/min    Comment: (NOTE) Calculated using the CKD-EPI Creatinine Equation (2021)    Anion gap 8 5 - 15    Comment: Performed at [facility], [address] [place], [place] [address]  Magnesium     Status: None   Collection Time: 01/03/23 11:48 PM  Result Value Ref Range   Magnesium 2.2 1.7 - 2.4 mg/dL    Comment: Performed at [facility], [address] [place], [place] [address]  Valproic acid level     Status: Abnormal   Collection Time: 01/03/23 11:48 PM  Result Value Ref Range   Valproic Acid Lvl  12 (L) 50.0 - 100.0 ug/mL    Comment: Performed at [facility], [address], [place], [place] [address]  Urinalysis, Routine w reflex microscopic -Urine, Catheterized     Status: None   Collection Time: 01/04/23  3:33 AM  Result Value Ref Range   Color, Urine YELLOW YELLOW   APPearance CLEAR CLEAR    Specific Gravity, Urine 1.013 1.005 - 1.030   pH 7.0 5.0 - 8.0   Glucose, UA NEGATIVE NEGATIVE mg/dL   Hgb urine dipstick NEGATIVE NEGATIVE   Bilirubin Urine NEGATIVE NEGATIVE   Ketones, ur NEGATIVE NEGATIVE mg/dL   Protein, ur NEGATIVE NEGATIVE mg/dL   Nitrite NEGATIVE NEGATIVE   Leukocytes,Ua NEGATIVE NEGATIVE    Comment: Performed at [facility], [address] [place], [place] [address]    No current facility-administered medications for this encounter.   Current Outpatient Medications  Medication Sig Dispense Refill   atorvastatin (LIPITOR) 40 MG tablet Take 1 tablet (40 mg total) by mouth every evening. Please call our office to schedule an yearly appointment with Dr. Allyson Sabal for November 2024 before anymore refills. [phone] 30 tablet 1   Azelaic Acid 15 % gel Apply 1 Application topically 2 (two) times daily.     b complex vitamins tablet Take 1 tablet by mouth daily.     carbidopa-levodopa (SINEMET IR) 25-250 MG per tablet Take 1 tablet by mouth 2 (two) times daily.      divalproex (DEPAKOTE) 125 MG DR tablet Take 125-250 mg by mouth See admin instructions. Give 125mg  (1 tablet) by mouth in the morning and 250mg  (2 tablets) by mouth at bedtime.     donepezil (ARICEPT) 10 MG tablet Take 1 tablet (10 mg total) by mouth at bedtime. 90 tablet 2   Evolocumab (REPATHA SURECLICK) 140 MG/ML SOAJ Inject 140 mg into the skin every 14 (fourteen) days.     folic acid (FOLVITE) 1 MG tablet Take 1 mg by mouth at bedtime.      haloperidol lactate (HALDOL) 5 MG/ML injection Inject 2.5 mg into the muscle once.     hydrOXYzine (ATARAX) 25 MG tablet Take 25 mg by mouth See admin instructions. Give 25mg  by mouth every evening for 3 days starting 01/03/23 and give 25mg  by mouth every 8 hours as needed for anxiety for 14 days starting 01/03/23.     lamoTRIgine (LAMICTAL) 150 MG tablet Take 150 mg by mouth 2 (two) times daily.     lisinopril (ZESTRIL) 10 MG tablet Take 1 tablet (10 mg total)  by mouth every evening. Please call our office to schedule an yearly appointment with Dr. Allyson Sabal for November 2024 before anymore refills. [phone]. Thank you 1st attempt 30 tablet 1   memantine (NAMENDA) 10 MG tablet Take 10 mg by mouth 2 (two) times daily.      mirtazapine (REMERON) 15 MG tablet Take 15 mg by mouth at bedtime.     rivaroxaban (XARELTO) 20 MG TABS tablet Take 1 tablet (20 mg total) by mouth daily. 30 tablet 5    Musculoskeletal: Strength & Muscle Tone: decreased Gait & Station:  Wheelchair Patient leans: N/A   Psychiatric Specialty Exam: Presentation  General Appearance:  Other (comment) (Atypical and oddly related interpersonal style)  Eye Contact: Absent  Speech: Garbled  Speech Volume: Decreased  Handedness: Right   Mood and Affect  Mood: -- ("I think I'm feeling pretty good")  Affect: Restricted   Thought Process  Thought Processes: Disorganized  Descriptions of Associations:Tangential  Orientation:Other (comment) (Oriented to self)  Thought Content:Other (comment) (Disorganized)  History of Schizophrenia/Schizoaffective disorder:No data recorded Duration of Psychotic Symptoms:No data recorded Hallucinations:Hallucinations: None  Ideas of Reference:None  Suicidal Thoughts:Suicidal Thoughts: No  Homicidal Thoughts:Homicidal Thoughts: No   Sensorium  Memory: Immediate Poor; Recent Poor; Remote Poor  Judgment: Other (comment) (Chronically impaired and poor d/t dementia)  Insight: None   Executive Functions  Concentration: Poor  Attention Span: Poor  Recall: Poor  Fund of Knowledge: Poor  Language: Poor   Psychomotor Activity  Psychomotor Activity: Psychomotor Activity: Normal   Assets  Assets: Housing    Sleep  Sleep: Sleep: Good   Physical Exam: Physical Exam Vitals and nursing note reviewed.  Constitutional:      General: He is not in acute distress.    Appearance: He is ill-appearing.  He is not toxic-appearing or diaphoretic.     Comments: Atypical and oddly related interpersonal style   Pulmonary:     Effort: Pulmonary effort is normal.  Skin:    General: Skin is warm and dry.  Neurological:     Mental Status: He is disoriented.     Motor: No tremor.     Comments: Only oriented to self   Psychiatric:        Attention and Perception: He is inattentive.        Mood and Affect: Mood normal. Affect is inappropriate (Constricted).        Speech: Speech is tangential (Disorganized).        Behavior: Behavior is withdrawn. Behavior is not agitated, slowed, aggressive, hyperactive or combative.        Thought Content: Thought content is not paranoid or delusional. Thought content does not include homicidal or suicidal ideation.        Cognition and Memory: Cognition is impaired. Memory is impaired.        Judgment: Judgment is impulsive and inappropriate.    Review of Systems  Unable to perform ROS: Dementia   Blood pressure (!) 196/83, pulse 86, temperature 98 F (36.7 C), temperature source Oral, resp. rate 15, SpO2 97%. There is no height or weight on file to calculate BMI.  Medical Decision Making:  Patient presented this encounter by way of EMS from his assisted living facility due to worsening aggressive and inappropriate behavior in the form of hitting at staff who attempt to provide care for him and wandering into other peoples rooms at the facility.  Psychiatry was consulted to make medication adjustments to help facilitate a reduction in recent increases in aggression and agitations, as well as inappropriate behaviors that have been of concern.  Upon evaluation, patient presents with symptomology that is most consistent with the patient's historical, chronic, and current diagnosis of Alzheimer's and vascular dementia.  From chart review of the patient's encounters with his outpatient neurology office at the [facility] clinic, patient has appreciably only done well  and had reductions in behavioral disturbances on higher doses of Depakote (I.e., 250mg  PO QAM/375mg  PO QHS), thus at this time will increase the patient's Depakote back to this from its current dosage at 125mg  po QAM/250mg  PO at bedtime (per nursing home reports).  Outside of this, given the chronic, progressive, and expected decline as a part of the patient's illness course, patient does not meet inpatient criteria for mental health stabilization, nor does the patient present meeting [facility] IVC criteria, as he has facility placement and staff support at his current residence he is apart of in assisted living, thus  the recommendation at this time is for psychiatric clearance, as well as the additional recommendations listed below.  Recommendations-psychiatrically cleared  #Dementia with behavioral disturbance (HCC) #Mixed Alzheimer's and vascular dementia (HCC)  -Recommend discharge back to assisted living facility at [facility] -Recommend [facility] consult to facilitate transition back to assisted living -Recommend close follow-up at patient's living facility with Neurology  -Recommend continue outpatient medications, but with adjustments listed below: --> Increase Depakote from 125 mg p.o. every morning and 250 mg p.o. nightly to Depakote to 250 mg p.o. every morning and 375 mg p.o. nightly  Disposition: No evidence of imminent risk to self or others at present.   Patient does not meet criteria for psychiatric inpatient admission.  Lenox Ponds, NP 01/04/2023 8:46 AM

## 2023-01-04 NOTE — ED Provider Notes (Signed)
Emergency Medicine Observation Re-evaluation Note  [name] [name] is a 77 y.o. male, seen on rounds today.  Pt initially presented to the ED for complaints of history of dementia, with episodic agitated behavior pt currently calm, resting, easily aroused. No new c/o currently.   Physical Exam  BP (!) 196/83   Pulse 86   Temp 98 F (36.7 C) (Oral)   Resp 15   SpO2 97%  Physical Exam General: resting. Cardiac: regular rate.  Lungs: breathing comfortably. Psych: calm, not agitated.   ED Course / MDM    I have reviewed the labs performed to date as well as medications administered while in observation.  Recent changes in the last 24 hours include ED obs, reassessment.   Plan  Pt has history of advanced dementia with behavioral symptoms - current presentation appears c/w history. No fevers. Recent ED workup appears complete including ct head neg for acute process, chem/labs that are unremarkable, and ua neg for uti.   Pt is on very low dose depakote. Will increase to 250 mg in am and 500 mg in pm.  Rec close pcp follow up, including for patients ongoing  and chronic medication management.   Pt currently appears stable for d/c from ED back to his facility. Return precautions provided.     Cathren Laine, MD 01/04/23 [phone]

## 2023-01-04 NOTE — ED Notes (Signed)
ATTEMPTED IN/OUT CATH WITHOUT SUCCESS. PLACED CONDOM CATH FOR UA

## 2023-01-04 NOTE — ED Notes (Signed)
Daughter updated as to patient's status. 

## 2023-01-05 ENCOUNTER — Ambulatory Visit: Payer: Medicare PPO

## 2023-01-05 DIAGNOSIS — R531 Weakness: Secondary | ICD-10-CM | POA: Diagnosis not present

## 2023-01-05 DIAGNOSIS — G309 Alzheimer's disease, unspecified: Secondary | ICD-10-CM | POA: Diagnosis not present

## 2023-01-05 DIAGNOSIS — R451 Restlessness and agitation: Secondary | ICD-10-CM | POA: Diagnosis not present

## 2023-01-06 DIAGNOSIS — F03C18 Unspecified dementia, severe, with other behavioral disturbance: Secondary | ICD-10-CM | POA: Diagnosis not present

## 2023-01-09 ENCOUNTER — Emergency Department (HOSPITAL_COMMUNITY): Payer: Medicare PPO

## 2023-01-09 ENCOUNTER — Other Ambulatory Visit: Payer: Self-pay

## 2023-01-09 ENCOUNTER — Inpatient Hospital Stay (HOSPITAL_COMMUNITY)
Admission: EM | Admit: 2023-01-09 | Discharge: 2023-01-23 | DRG: 183 | Disposition: A | Discharge status: Skilled Nursing Facility | Payer: Medicare PPO | Source: Skilled Nursing Facility | Attending: General Surgery | Admitting: General Surgery

## 2023-01-09 ENCOUNTER — Encounter (HOSPITAL_COMMUNITY): Payer: Self-pay | Admitting: General Surgery

## 2023-01-09 DIAGNOSIS — E785 Hyperlipidemia, unspecified: Secondary | ICD-10-CM | POA: Diagnosis present

## 2023-01-09 DIAGNOSIS — I4819 Other persistent atrial fibrillation: Secondary | ICD-10-CM | POA: Diagnosis not present

## 2023-01-09 DIAGNOSIS — Y92129 Unspecified place in nursing home as the place of occurrence of the external cause: Secondary | ICD-10-CM | POA: Diagnosis not present

## 2023-01-09 DIAGNOSIS — R402362 Coma scale, best motor response, obeys commands, at arrival to emergency department: Secondary | ICD-10-CM | POA: Diagnosis present

## 2023-01-09 DIAGNOSIS — S060X0A Concussion without loss of consciousness, initial encounter: Secondary | ICD-10-CM | POA: Diagnosis present

## 2023-01-09 DIAGNOSIS — R590 Localized enlarged lymph nodes: Secondary | ICD-10-CM | POA: Diagnosis not present

## 2023-01-09 DIAGNOSIS — G4733 Obstructive sleep apnea (adult) (pediatric): Secondary | ICD-10-CM | POA: Diagnosis present

## 2023-01-09 DIAGNOSIS — R1310 Dysphagia, unspecified: Secondary | ICD-10-CM | POA: Diagnosis present

## 2023-01-09 DIAGNOSIS — I4891 Unspecified atrial fibrillation: Secondary | ICD-10-CM | POA: Insufficient documentation

## 2023-01-09 DIAGNOSIS — D631 Anemia in chronic kidney disease: Secondary | ICD-10-CM | POA: Diagnosis present

## 2023-01-09 DIAGNOSIS — S2242XA Multiple fractures of ribs, left side, initial encounter for closed fracture: Secondary | ICD-10-CM | POA: Diagnosis not present

## 2023-01-09 DIAGNOSIS — M1612 Unilateral primary osteoarthritis, left hip: Secondary | ICD-10-CM | POA: Diagnosis not present

## 2023-01-09 DIAGNOSIS — S0990XA Unspecified injury of head, initial encounter: Secondary | ICD-10-CM | POA: Diagnosis not present

## 2023-01-09 DIAGNOSIS — R1909 Other intra-abdominal and pelvic swelling, mass and lump: Secondary | ICD-10-CM | POA: Diagnosis not present

## 2023-01-09 DIAGNOSIS — R1903 Right lower quadrant abdominal swelling, mass and lump: Secondary | ICD-10-CM | POA: Diagnosis present

## 2023-01-09 DIAGNOSIS — S0003XA Contusion of scalp, initial encounter: Secondary | ICD-10-CM | POA: Diagnosis not present

## 2023-01-09 DIAGNOSIS — Z7189 Other specified counseling: Secondary | ICD-10-CM | POA: Diagnosis not present

## 2023-01-09 DIAGNOSIS — R9431 Abnormal electrocardiogram [ECG] [EKG]: Secondary | ICD-10-CM | POA: Diagnosis not present

## 2023-01-09 DIAGNOSIS — Z91040 Latex allergy status: Secondary | ICD-10-CM | POA: Diagnosis not present

## 2023-01-09 DIAGNOSIS — J9690 Respiratory failure, unspecified, unspecified whether with hypoxia or hypercapnia: Secondary | ICD-10-CM | POA: Diagnosis not present

## 2023-01-09 DIAGNOSIS — F0283 Dementia in other diseases classified elsewhere, unspecified severity, with mood disturbance: Secondary | ICD-10-CM | POA: Diagnosis present

## 2023-01-09 DIAGNOSIS — S199XXA Unspecified injury of neck, initial encounter: Secondary | ICD-10-CM | POA: Diagnosis not present

## 2023-01-09 DIAGNOSIS — S060XAA Concussion with loss of consciousness status unknown, initial encounter: Secondary | ICD-10-CM | POA: Diagnosis present

## 2023-01-09 DIAGNOSIS — I25810 Atherosclerosis of coronary artery bypass graft(s) without angina pectoris: Secondary | ICD-10-CM | POA: Insufficient documentation

## 2023-01-09 DIAGNOSIS — I2510 Atherosclerotic heart disease of native coronary artery without angina pectoris: Secondary | ICD-10-CM | POA: Diagnosis present

## 2023-01-09 DIAGNOSIS — S2242XD Multiple fractures of ribs, left side, subsequent encounter for fracture with routine healing: Secondary | ICD-10-CM | POA: Diagnosis not present

## 2023-01-09 DIAGNOSIS — F03918 Unspecified dementia, unspecified severity, with other behavioral disturbance: Secondary | ICD-10-CM | POA: Diagnosis not present

## 2023-01-09 DIAGNOSIS — I6782 Cerebral ischemia: Secondary | ICD-10-CM | POA: Diagnosis not present

## 2023-01-09 DIAGNOSIS — I10 Essential (primary) hypertension: Secondary | ICD-10-CM | POA: Insufficient documentation

## 2023-01-09 DIAGNOSIS — Z515 Encounter for palliative care: Secondary | ICD-10-CM | POA: Diagnosis not present

## 2023-01-09 DIAGNOSIS — F319 Bipolar disorder, unspecified: Secondary | ICD-10-CM | POA: Diagnosis not present

## 2023-01-09 DIAGNOSIS — W19XXXA Unspecified fall, initial encounter: Secondary | ICD-10-CM | POA: Diagnosis present

## 2023-01-09 DIAGNOSIS — I482 Chronic atrial fibrillation, unspecified: Secondary | ICD-10-CM | POA: Diagnosis not present

## 2023-01-09 DIAGNOSIS — Z7901 Long term (current) use of anticoagulants: Secondary | ICD-10-CM | POA: Diagnosis not present

## 2023-01-09 DIAGNOSIS — G309 Alzheimer's disease, unspecified: Secondary | ICD-10-CM | POA: Diagnosis not present

## 2023-01-09 DIAGNOSIS — R402222 Coma scale, best verbal response, incomprehensible words, at arrival to emergency department: Secondary | ICD-10-CM | POA: Diagnosis present

## 2023-01-09 DIAGNOSIS — S2249XA Multiple fractures of ribs, unspecified side, initial encounter for closed fracture: Principal | ICD-10-CM

## 2023-01-09 DIAGNOSIS — Z7401 Bed confinement status: Secondary | ICD-10-CM | POA: Diagnosis not present

## 2023-01-09 DIAGNOSIS — R531 Weakness: Secondary | ICD-10-CM | POA: Diagnosis not present

## 2023-01-09 DIAGNOSIS — S3993XA Unspecified injury of pelvis, initial encounter: Secondary | ICD-10-CM | POA: Diagnosis not present

## 2023-01-09 DIAGNOSIS — R296 Repeated falls: Secondary | ICD-10-CM | POA: Diagnosis present

## 2023-01-09 DIAGNOSIS — R41841 Cognitive communication deficit: Secondary | ICD-10-CM | POA: Diagnosis not present

## 2023-01-09 DIAGNOSIS — R402112 Coma scale, eyes open, never, at arrival to emergency department: Secondary | ICD-10-CM | POA: Diagnosis not present

## 2023-01-09 DIAGNOSIS — I959 Hypotension, unspecified: Secondary | ICD-10-CM | POA: Diagnosis not present

## 2023-01-09 DIAGNOSIS — D6489 Other specified anemias: Secondary | ICD-10-CM | POA: Diagnosis not present

## 2023-01-09 DIAGNOSIS — F339 Major depressive disorder, recurrent, unspecified: Secondary | ICD-10-CM | POA: Diagnosis not present

## 2023-01-09 DIAGNOSIS — Z043 Encounter for examination and observation following other accident: Secondary | ICD-10-CM | POA: Diagnosis not present

## 2023-01-09 DIAGNOSIS — R1319 Other dysphagia: Secondary | ICD-10-CM | POA: Diagnosis not present

## 2023-01-09 DIAGNOSIS — I517 Cardiomegaly: Secondary | ICD-10-CM | POA: Diagnosis not present

## 2023-01-09 DIAGNOSIS — S2241XA Multiple fractures of ribs, right side, initial encounter for closed fracture: Secondary | ICD-10-CM | POA: Diagnosis not present

## 2023-01-09 DIAGNOSIS — M6281 Muscle weakness (generalized): Secondary | ICD-10-CM | POA: Diagnosis not present

## 2023-01-09 DIAGNOSIS — I700 Atherosclerosis of aorta: Secondary | ICD-10-CM | POA: Diagnosis not present

## 2023-01-09 DIAGNOSIS — M25559 Pain in unspecified hip: Secondary | ICD-10-CM | POA: Diagnosis not present

## 2023-01-09 DIAGNOSIS — R1312 Dysphagia, oropharyngeal phase: Secondary | ICD-10-CM | POA: Diagnosis not present

## 2023-01-09 DIAGNOSIS — F0390 Unspecified dementia without behavioral disturbance: Secondary | ICD-10-CM | POA: Diagnosis not present

## 2023-01-09 DIAGNOSIS — Z96641 Presence of right artificial hip joint: Secondary | ICD-10-CM | POA: Diagnosis not present

## 2023-01-09 LAB — URINALYSIS, ROUTINE W REFLEX MICROSCOPIC
Bilirubin Urine: NEGATIVE
Glucose, UA: NEGATIVE mg/dL
Hgb urine dipstick: NEGATIVE
Ketones, ur: 20 mg/dL — AB
Nitrite: POSITIVE — AB
Protein, ur: NEGATIVE mg/dL
Specific Gravity, Urine: >1.046 — ABNORMAL HIGH (ref 1.005–1.030)
pH: 6 (ref 5.0–8.0)

## 2023-01-09 LAB — CBC
HCT: 36 % — ABNORMAL LOW (ref 39.0–52.0)
Hemoglobin: 11.8 g/dL — ABNORMAL LOW (ref 13.0–17.0)
MCH: 30.7 pg (ref 26.0–34.0)
MCHC: 32.8 g/dL (ref 30.0–36.0)
MCV: 93.8 fL (ref 80.0–100.0)
Platelets: 120 10*3/uL — ABNORMAL LOW (ref 150–400)
RBC: 3.84 MIL/uL — ABNORMAL LOW (ref 4.22–5.81)
RDW: 13.2 % (ref 11.5–15.5)
WBC: 13.7 10*3/uL — ABNORMAL HIGH (ref 4.0–10.5)
nRBC: 0 % (ref 0.0–0.2)

## 2023-01-09 LAB — COMPREHENSIVE METABOLIC PANEL
ALT: 10 U/L (ref 0–44)
AST: 22 U/L (ref 15–41)
Albumin: 3.2 g/dL — ABNORMAL LOW (ref 3.5–5.0)
Alkaline Phosphatase: 118 U/L (ref 38–126)
Anion gap: 10 (ref 5–15)
BUN: 25 mg/dL — ABNORMAL HIGH (ref 8–23)
CO2: 27 mmol/L (ref 22–32)
Calcium: 10.1 mg/dL (ref 8.9–10.3)
Chloride: 101 mmol/L (ref 98–111)
Creatinine, Ser: 1.44 mg/dL — ABNORMAL HIGH (ref 0.61–1.24)
GFR, Estimated: 49 mL/min — ABNORMAL LOW (ref >60)
Glucose, Bld: 136 mg/dL — ABNORMAL HIGH (ref 70–99)
Potassium: 4.6 mmol/L (ref 3.5–5.1)
Sodium: 138 mmol/L (ref 135–145)
Total Bilirubin: 1 mg/dL (ref <1.2)
Total Protein: 6.7 g/dL (ref 6.5–8.1)

## 2023-01-09 LAB — I-STAT CHEM 8, ED
BUN: 26 mg/dL — ABNORMAL HIGH (ref 8–23)
Calcium, Ion: 1.34 mmol/L (ref 1.15–1.40)
Chloride: 99 mmol/L (ref 98–111)
Creatinine, Ser: 1.3 mg/dL — ABNORMAL HIGH (ref 0.61–1.24)
Glucose, Bld: 139 mg/dL — ABNORMAL HIGH (ref 70–99)
HCT: 38 % — ABNORMAL LOW (ref 39.0–52.0)
Hemoglobin: 12.9 g/dL — ABNORMAL LOW (ref 13.0–17.0)
Potassium: 4.5 mmol/L (ref 3.5–5.1)
Sodium: 138 mmol/L (ref 135–145)
TCO2: 26 mmol/L (ref 22–32)

## 2023-01-09 LAB — SAMPLE TO BLOOD BANK

## 2023-01-09 LAB — PROTIME-INR
INR: 2.5 — ABNORMAL HIGH (ref 0.8–1.2)
Prothrombin Time: 26.9 s — ABNORMAL HIGH (ref 11.4–15.2)

## 2023-01-09 LAB — ETHANOL: Alcohol, Ethyl (B): <10 mg/dL (ref <10)

## 2023-01-09 LAB — I-STAT CG4 LACTIC ACID, ED: Lactic Acid, Venous: 1.4 mmol/L (ref 0.5–1.9)

## 2023-01-09 MED ORDER — HYDRALAZINE HCL 20 MG/ML IJ SOLN: 10.0000 mg | INTRAMUSCULAR | Status: DC | PRN

## 2023-01-09 MED ORDER — PANTOPRAZOLE SODIUM 40 MG IV SOLR
40.0000 mg | Freq: Every day | INTRAVENOUS | Status: DC
  Administered 2023-01-09 – 2023-01-22 (×7): 40 mg via INTRAVENOUS
  Filled 2023-01-09 (×8): qty 10

## 2023-01-09 MED ORDER — DOCUSATE SODIUM 100 MG PO CAPS
100.0000 mg | ORAL_CAPSULE | Freq: Two times a day (BID) | ORAL | Status: DC
  Administered 2023-01-10 – 2023-01-15 (×8): 100 mg via ORAL
  Filled 2023-01-09 (×10): qty 1

## 2023-01-09 MED ORDER — METHOCARBAMOL 1000 MG/10ML IJ SOLN
500.0000 mg | Freq: Three times a day (TID) | INTRAMUSCULAR | Status: DC
  Administered 2023-01-09 – 2023-01-21 (×36): 500 mg via INTRAVENOUS
  Filled 2023-01-09 (×37): qty 10

## 2023-01-09 MED ORDER — HALOPERIDOL LACTATE 5 MG/ML IJ SOLN
5.0000 mg | Freq: Four times a day (QID) | INTRAMUSCULAR | Status: DC | PRN
  Administered 2023-01-09 – 2023-01-23 (×12): 5 mg via INTRAVENOUS
  Filled 2023-01-09 (×12): qty 1

## 2023-01-09 MED ORDER — ACETAMINOPHEN 500 MG PO TABS
1000.0000 mg | ORAL_TABLET | Freq: Four times a day (QID) | ORAL | Status: DC
  Administered 2023-01-10 – 2023-01-23 (×32): 1000 mg via ORAL
  Filled 2023-01-09 (×39): qty 2

## 2023-01-09 MED ORDER — KCL IN DEXTROSE-NACL 20-5-0.45 MEQ/L-%-% IV SOLN
INTRAVENOUS | Status: AC
  Filled 2023-01-09 (×2): qty 1000

## 2023-01-09 MED ORDER — ONDANSETRON HCL 4 MG/2ML IJ SOLN: 4.0000 mg | Freq: Four times a day (QID) | INTRAMUSCULAR | Status: DC | PRN

## 2023-01-09 MED ORDER — POLYETHYLENE GLYCOL 3350 17 G PO PACK: 17.0000 g | PACK | Freq: Every day | ORAL | Status: DC | PRN

## 2023-01-09 MED ORDER — ONDANSETRON 4 MG PO TBDP: 4.0000 mg | ORAL_TABLET | Freq: Four times a day (QID) | ORAL | Status: DC | PRN

## 2023-01-09 MED ORDER — MORPHINE SULFATE (PF) 4 MG/ML IV SOLN: 4.0000 mg | INTRAVENOUS | Status: DC | PRN

## 2023-01-09 MED ORDER — MORPHINE SULFATE (PF) 2 MG/ML IV SOLN: 2.0000 mg | INTRAVENOUS | Status: DC | PRN

## 2023-01-09 MED ORDER — PANTOPRAZOLE SODIUM 40 MG PO TBEC
40.0000 mg | DELAYED_RELEASE_TABLET | Freq: Every day | ORAL | Status: DC
  Administered 2023-01-11 – 2023-01-23 (×8): 40 mg via ORAL
  Filled 2023-01-09 (×10): qty 1

## 2023-01-09 MED ORDER — METOPROLOL TARTRATE 5 MG/5ML IV SOLN: 5.0000 mg | Freq: Four times a day (QID) | INTRAVENOUS | Status: DC | PRN

## 2023-01-09 MED ORDER — ENOXAPARIN SODIUM 30 MG/0.3ML IJ SOSY
30.0000 mg | PREFILLED_SYRINGE | Freq: Two times a day (BID) | INTRAMUSCULAR | Status: DC
  Administered 2023-01-10 – 2023-01-23 (×27): 30 mg via SUBCUTANEOUS
  Filled 2023-01-09 (×27): qty 0.3

## 2023-01-09 MED ORDER — IOHEXOL 350 MG/ML SOLN
75.0000 mL | Freq: Once | INTRAVENOUS | Status: DC | PRN
Start: 2023-01-09 — End: 2023-01-23

## 2023-01-09 MED ORDER — IOHEXOL 350 MG/ML SOLN
75.0000 mL | Freq: Once | INTRAVENOUS | Status: AC | PRN
Start: 2023-01-09 — End: 2023-01-09
  Administered 2023-01-09: 75 mL via INTRAVENOUS

## 2023-01-09 NOTE — ED Notes (Signed)
X-ray at bedside

## 2023-01-09 NOTE — Progress Notes (Addendum)
Patient is freq. Trying to get out of bed and setting the bed alarm off and pulling at lines.  Patient  heart rate goes up to 140's with any activity At. Fib.Patient is confused. . Several attempts at trying to  orientate patient to situation . Tim R.N. aware. Dr Clarene Critchley text page the above. See new orders for Haldol 5 mg I.V. Wife is aware of the above. Haldol 5mg   IV given patient is resting comfort. Cont. To monitor patient.

## 2023-01-09 NOTE — ED Triage Notes (Signed)
Pt BIB [facility] from [facility] as Level 1 FOT, takes Xarelto. Pt had an unwitnessed fall at 0900 & staff there reports finding him down & his Neuro exam was at his baseline. He does have a hematoma to the back of his head & began getting lethargic & hard to rouse & is not conversing at his usual baseline (Hx of dementia). Rt poupil is a 2 & Lt pupil is a 3 (per EMS) & reports he has a very strong urine smell odor to him when they arrived. 110/47, 80 bpm, 99% on RA, CBG 173, 20g Rt FA.

## 2023-01-09 NOTE — ED Notes (Signed)
Transported to CT 

## 2023-01-09 NOTE — ED Notes (Signed)
ED TO INPATIENT HANDOFF REPORT  ED Nurse Name and Phone #: Beatris Ship RN [phone]  S Name/Age/Gender [name] [name] 77 y.o. male Room/Bed: RESUSC/RESUSC  Code Status   Code Status: Full Code  Home/SNF/Other Skilled nursing facility Patient oriented to: self Is this baseline? Yes   Triage Complete: Triage complete  Chief Complaint Concussion [S06.0XAA]  Triage Note Pt BIB [facility] from [facility] as Level 1 FOT, takes Xarelto. Pt had an unwitnessed fall at 0900 & staff there reports finding him down & his Neuro exam was at his baseline. He does have a hematoma to the back of his head & began getting lethargic & hard to rouse & is not conversing at his usual baseline (Hx of dementia). Rt poupil is a 2 & Lt pupil is a 3 (per EMS) & reports he has a very strong urine smell odor to him when they arrived. 110/47, 80 bpm, 99% on RA, CBG 173, 20g Rt FA.    Allergies Allergies  Allergen Reactions   Latex     Level of Care/Admitting Diagnosis ED Disposition     ED Disposition  Admit   Condition  --   Comment  Hospital Area: [facility] [[id number]]  Level of Care: Progressive [102]  Admit to Progressive based on following criteria: Other see comments  Comments: trauma  May admit patient to [facility] or [facility] if equivalent level of care is available:: No  Covid Evaluation: Asymptomatic - no recent exposure (last 10 days) testing not required  Diagnosis: Concussion [027253]  Admitting Physician: Violeta Gelinas [[id number]]  Attending Physician: TRAUMA MD [[id number]]  Bed request comments: 4NP or 4E  Certification:: I certify this patient will need inpatient services for at least 2 midnights  Estimated Length of Stay: 2          B Medical/Surgery History History reviewed. No pertinent past medical history. History reviewed. No pertinent surgical history.   A IV Location/Drains/Wounds Patient Lines/Drains/Airways Status     Active Line/Drains/Airways      None            Intake/Output Last 24 hours No intake or output data in the 24 hours ending 01/09/23 1657  Labs/Imaging No results found for this or any previous visit (from the past 48 hour(s)). CT CHEST ABDOMEN PELVIS W CONTRAST  Result Date: 01/09/2023 CLINICAL DATA:  Unwitnessed fall. EXAM: CT CHEST, ABDOMEN, AND PELVIS WITH CONTRAST TECHNIQUE: Multidetector CT imaging of the chest, abdomen and pelvis was performed following the standard protocol during bolus administration of intravenous contrast. RADIATION DOSE REDUCTION: This exam was performed according to the departmental dose-optimization program which includes automated exposure control, adjustment of the mA and/or kV according to patient size and/or use of iterative reconstruction technique. CONTRAST:  75mL OMNIPAQUE IOHEXOL 350 MG/ML SOLN, <See Chart> OMNIPAQUE IOHEXOL 350 MG/ML SOLN COMPARISON:  None Available. FINDINGS: CT CHEST FINDINGS Cardiovascular: Mild cardiac enlargement aortic atherosclerosis. Three vessel coronary artery calcifications. No pericardial effusion. Mediastinum/Nodes: Calcified mediastinal and hilar lymph nodes. Thyroid gland, trachea and esophagus appear normal. Lungs/Pleura: No pleural effusion. Subsegmental atelectasis noted in the posterior lung bases, left greater than right. Non solid nodule in the superior segment of the left lower lobe measures 1.6 cm, image 75/8. Calcified nodules within the lingula, image 88/8. Within the superior segment of the left lower lobe there is a 9 mm ground-glass nodule, image 62/8. No pneumothorax identified. Musculoskeletal: The fractures are identified involving the lateral and aspect of the left tenth,  ninth and eighth ribs. CT ABDOMEN PELVIS FINDINGS Hepatobiliary: No hepatic injury or perihepatic hematoma. Gallbladder is unremarkable. Pancreas: Unremarkable. No pancreatic ductal dilatation or surrounding inflammatory changes. Spleen: No splenic injury or perisplenic  hematoma. Calcified granulomas identified. Adrenals/Urinary Tract: Normal appearance of the adrenal glands. Simple appearing exophytic cyst arises off the anterolateral cortex of the left kidney measuring 3.5 cm, image 69/7. There is a small amount of fluid adjacent to the lower pole of the right kidney, image 78/2. No signs of renal laceration or contusion. Asymmetric left renal cortical scarring and volume loss. Mild bladder wall thickening with surrounding haziness. Stomach/Bowel: The stomach appears within normal limits. There is no pathologic dilatation of the large or small bowel loops. Bowel anatomy in the right lower quadrant is difficult to evaluate reflecting lack of enteric contrast material. There is a suspected filling defect within the either the cecum or distal ileum which appears hyperdense and may represent an underlying neoplasm. Vascular/Lymphatic: Aortic atherosclerosis. No aneurysm. The abdominal vascularity appears patent. In the right lower quadrant of the abdomen adjacent to the ileum and cecum there is a soft tissue mass containing internal calcifications measuring 3.3 x 1.9 by 2.7 cm. No retroperitoneal or pelvic adenopathy. Reproductive: Prostate gland enlargement. Other: No significant free fluid or fluid collections. No signs of pneumoperitoneum. Musculoskeletal: Previous right hip arthroplasty. Bones appear diffusely osteopenic. Multilevel lumbar degenerative disc disease. Lumbar vertebral body heights are well preserved without signs of fracture. IMPRESSION: 1. Acute fractures involving the lateral aspects of the left tenth, ninth and eighth ribs. No signs of pneumothorax. 2. Subtle, right anterior superior endplate deformity involving the T3 vertebra compatible with an age-indeterminate fracture. 3. Small amount of low-attenuation fluid adjacent to the lower pole of the right kidney. No signs of renal laceration or contusion. 4. In the right lower quadrant of the abdomen, adjacent to  the ileum and cecum there is a soft tissue mass containing internal calcifications measuring 3.3 x 1.9 x 2.7 cm. Cannot rule out underlying carcinoid tumor. 5. Bowel anatomy in the right lower quadrant is difficult to evaluate reflecting lack of enteric contrast material. There is a suspected filling defect within the either the cecum or distal ileum which appears hyperdense and may represent an underlying neoplasm. Recommend follow-up imaging with CT of the abdomen pelvis with oral and IV contrast material as well as visualization with colonoscopy. 6. Two non solid nodules in the left lower lobe are noted measuring up to 1.6 cm. Non-contrast chest CT at 3-6 months is recommended. If nodules persist, subsequent management will be based upon the most suspicious nodule(s). This recommendation follows the consensus statement: Guidelines for Management of Incidental Pulmonary Nodules Detected on CT Images: From the Fleischner Society 2017; Radiology 2017; 284:228-243. 7. Mild bladder wall thickening with surrounding 2 haziness. Correlate for any signs/symptoms of cystitis. 8. Coronary artery calcifications. 9. Prostate gland enlargement. 10.  Aortic Atherosclerosis (ICD10-I70.0). These results were called by telephone at the time of interpretation on 01/09/2023 at 4:48 pm to provider Violeta Gelinas, MD, who acknowledged these results. Electronically Signed   By: Signa Kell M.D.   On: 01/09/2023 16:49    Pending Labs Unresulted Labs (From admission, onward)     Start     Ordered   01/09/23 1544  Comprehensive metabolic panel  [facility] ED TRAUMA PANEL [facility])  Once,   STAT        01/09/23 1545   01/09/23 1544  CBC  ([facility] ED TRAUMA PANEL [facility])  Once,  STAT        01/09/23 1545   01/09/23 1544  Ethanol  ([facility] ED TRAUMA PANEL [facility])  Once,   URGENT        01/09/23 1545   01/09/23 1544  Urinalysis, Routine w reflex microscopic -Urine, Clean Catch  [facility] ED TRAUMA PANEL [facility])  Once,   URGENT       Question:   Specimen Source  Answer:  Urine, Clean Catch   01/09/23 1545   01/09/23 1544  Protime-INR  ([facility] ED TRAUMA PANEL [facility])  Once,   STAT        01/09/23 1545   01/09/23 1544  Sample to Blood Bank  [facility] ED TRAUMA PANEL [facility])  Once,   URGENT        01/09/23 1545   Signed and Held  CBC  Tomorrow morning,   R        Signed and Held   Signed and Held  Basic metabolic panel  Tomorrow morning,   R        Signed and Held            Vitals/Pain Today's Vitals   01/09/23 1615 01/09/23 1630 01/09/23 1639 01/09/23 1644  BP: 126/61 125/72    Pulse: 76 74    Resp: (!) 25 (!) 26    SpO2: 100% 100%    Weight:   70.3 kg   Height:   5\' 11"  (1.803 m)   PainSc:    0-No pain    Isolation Precautions No active isolations  Medications Medications  iohexol (OMNIPAQUE) 350 MG/ML injection 75 mL ( Intravenous Canceled Entry 01/09/23 1619)  iohexol (OMNIPAQUE) 350 MG/ML injection 75 mL (75 mLs Intravenous Contrast Given 01/09/23 1619)    Mobility walks with person assist at baseline, no ambulation since arrival to ED.      R Recommendations: See Admitting Provider Note  Report given to: [phone]

## 2023-01-09 NOTE — ED Provider Notes (Signed)
[facility] AT [facility] Provider Note   CSN: [id number] Arrival date & time: 01/09/23  1535     History  Chief Complaint  Patient presents with   Level 1 FOT    [name] [name] is a 77 y.o. male.  Patient is a 77 year old male with a past medical history of A-fib on Xarelto, dementia ANO x 1 and conversational at baseline presenting to the emergency department after a fall.  Per EMS, the patient had an unwitnessed fall around 9 AM this morning.  Did note to have a hematoma to his posterior scalp but had a normal neurologic exam at that time.  Nursing home reports that throughout the day he has become increasingly lethargic and hard to wake up which is when they called 911.  Per his records he did receive his Xarelto this morning. Per EMS, he is full code.  The history is provided by the EMS personnel and medical records. The history is limited by the condition of the patient.       Home Medications Prior to Admission medications   Not on File      Allergies    Latex    Review of Systems   Review of Systems  Physical Exam Updated Vital Signs BP 125/72   Pulse 74   Resp (!) 26   Ht 5\' 11"  (1.803 m)   Wt 70.3 kg   SpO2 100%   BMI 21.62 kg/m  Physical Exam Vitals and nursing note reviewed.  Constitutional:      General: He is not in acute distress.    Comments: Drowsy, arousable to verbal stimulation  HENT:     Head: Normocephalic.     Comments: Posterior scalp hematoma    Nose: Nose normal.     Mouth/Throat:     Mouth: Mucous membranes are moist.     Pharynx: Oropharynx is clear.  Eyes:     Comments: Right pupil 2 mm, left pupil 3 mm, reactive bilaterally  Neck:     Comments: No midline neck tenderness, c-collar in place Cardiovascular:     Rate and Rhythm: Normal rate and regular rhythm.     Heart sounds: Normal heart sounds.  Pulmonary:     Effort: Pulmonary effort is normal.     Breath sounds: Normal breath sounds.   Abdominal:     General: Abdomen is flat.     Palpations: Abdomen is soft.     Tenderness: There is no abdominal tenderness.  Musculoskeletal:     Comments: No midline back tenderness No bony tenderness to bilateral upper and lower extremities  Skin:    General: Skin is warm.  Neurological:     Comments: Oriented to person only, GCS 11 (E1, V4, M6)     ED Results / Procedures / Treatments   Labs (all labs ordered are listed, but only abnormal results are displayed) Labs Reviewed  COMPREHENSIVE METABOLIC PANEL  CBC  ETHANOL  URINALYSIS, ROUTINE W REFLEX MICROSCOPIC  PROTIME-INR  I-STAT CHEM 8, ED  I-STAT CG4 LACTIC ACID, ED  SAMPLE TO BLOOD BANK    EKG EKG Interpretation Date/Time:  Friday January 09 2023 16:35:55 EST Ventricular Rate:  79 PR Interval:    QRS Duration:  106 QT Interval:  389 QTC Calculation: 446 R Axis:   102  Text Interpretation: Atrial fibrillation Right axis deviation No previous ECGs available Confirmed by Elayne Snare ([id number]) on 01/09/2023 4:43:25 PM  Radiology CT CHEST ABDOMEN PELVIS W  CONTRAST  Result Date: 01/09/2023 CLINICAL DATA:  Unwitnessed fall. EXAM: CT CHEST, ABDOMEN, AND PELVIS WITH CONTRAST TECHNIQUE: Multidetector CT imaging of the chest, abdomen and pelvis was performed following the standard protocol during bolus administration of intravenous contrast. RADIATION DOSE REDUCTION: This exam was performed according to the departmental dose-optimization program which includes automated exposure control, adjustment of the mA and/or kV according to patient size and/or use of iterative reconstruction technique. CONTRAST:  75mL OMNIPAQUE IOHEXOL 350 MG/ML SOLN, <See Chart> OMNIPAQUE IOHEXOL 350 MG/ML SOLN COMPARISON:  None Available. FINDINGS: CT CHEST FINDINGS Cardiovascular: Mild cardiac enlargement aortic atherosclerosis. Three vessel coronary artery calcifications. No pericardial effusion. Mediastinum/Nodes: Calcified mediastinal and  hilar lymph nodes. Thyroid gland, trachea and esophagus appear normal. Lungs/Pleura: No pleural effusion. Subsegmental atelectasis noted in the posterior lung bases, left greater than right. Non solid nodule in the superior segment of the left lower lobe measures 1.6 cm, image 75/8. Calcified nodules within the lingula, image 88/8. Within the superior segment of the left lower lobe there is a 9 mm ground-glass nodule, image 62/8. No pneumothorax identified. Musculoskeletal: The fractures are identified involving the lateral and aspect of the left tenth, ninth and eighth ribs. CT ABDOMEN PELVIS FINDINGS Hepatobiliary: No hepatic injury or perihepatic hematoma. Gallbladder is unremarkable. Pancreas: Unremarkable. No pancreatic ductal dilatation or surrounding inflammatory changes. Spleen: No splenic injury or perisplenic hematoma. Calcified granulomas identified. Adrenals/Urinary Tract: Normal appearance of the adrenal glands. Simple appearing exophytic cyst arises off the anterolateral cortex of the left kidney measuring 3.5 cm, image 69/7. There is a small amount of fluid adjacent to the lower pole of the right kidney, image 78/2. No signs of renal laceration or contusion. Asymmetric left renal cortical scarring and volume loss. Mild bladder wall thickening with surrounding haziness. Stomach/Bowel: The stomach appears within normal limits. There is no pathologic dilatation of the large or small bowel loops. Bowel anatomy in the right lower quadrant is difficult to evaluate reflecting lack of enteric contrast material. There is a suspected filling defect within the either the cecum or distal ileum which appears hyperdense and may represent an underlying neoplasm. Vascular/Lymphatic: Aortic atherosclerosis. No aneurysm. The abdominal vascularity appears patent. In the right lower quadrant of the abdomen adjacent to the ileum and cecum there is a soft tissue mass containing internal calcifications measuring 3.3 x 1.9  by 2.7 cm. No retroperitoneal or pelvic adenopathy. Reproductive: Prostate gland enlargement. Other: No significant free fluid or fluid collections. No signs of pneumoperitoneum. Musculoskeletal: Previous right hip arthroplasty. Bones appear diffusely osteopenic. Multilevel lumbar degenerative disc disease. Lumbar vertebral body heights are well preserved without signs of fracture. IMPRESSION: 1. Acute fractures involving the lateral aspects of the left tenth, ninth and eighth ribs. No signs of pneumothorax. 2. Subtle, right anterior superior endplate deformity involving the T3 vertebra compatible with an age-indeterminate fracture. 3. Small amount of low-attenuation fluid adjacent to the lower pole of the right kidney. No signs of renal laceration or contusion. 4. In the right lower quadrant of the abdomen, adjacent to the ileum and cecum there is a soft tissue mass containing internal calcifications measuring 3.3 x 1.9 x 2.7 cm. Cannot rule out underlying carcinoid tumor. 5. Bowel anatomy in the right lower quadrant is difficult to evaluate reflecting lack of enteric contrast material. There is a suspected filling defect within the either the cecum or distal ileum which appears hyperdense and may represent an underlying neoplasm. Recommend follow-up imaging with CT of the abdomen pelvis with oral and IV  contrast material as well as visualization with colonoscopy. 6. Two non solid nodules in the left lower lobe are noted measuring up to 1.6 cm. Non-contrast chest CT at 3-6 months is recommended. If nodules persist, subsequent management will be based upon the most suspicious nodule(s). This recommendation follows the consensus statement: Guidelines for Management of Incidental Pulmonary Nodules Detected on CT Images: From the Fleischner Society 2017; Radiology 2017; 284:228-243. 7. Mild bladder wall thickening with surrounding 2 haziness. Correlate for any signs/symptoms of cystitis. 8. Coronary artery  calcifications. 9. Prostate gland enlargement. 10.  Aortic Atherosclerosis (ICD10-I70.0). These results were called by telephone at the time of interpretation on 01/09/2023 at 4:48 pm to provider Violeta Gelinas, MD, who acknowledged these results. Electronically Signed   By: Signa Kell M.D.   On: 01/09/2023 16:49    Procedures Procedures    Medications Ordered in ED Medications  iohexol (OMNIPAQUE) 350 MG/ML injection 75 mL ( Intravenous Canceled Entry 01/09/23 1619)  iohexol (OMNIPAQUE) 350 MG/ML injection 75 mL (75 mLs Intravenous Contrast Given 01/09/23 1619)    ED Course/ Medical Decision Making/ A&P Clinical Course as of 01/09/23 1651  Fri Jan 09, 2023  1650 CT with multiple rib fractures. Trauma plans to admit for pain control. [VK]    Clinical Course User Index [VK] Rexford Maus, DO                                 Medical Decision Making This patient presents to the ED with chief complaint(s) of fall with pertinent past medical history of A fib on Xarelto, dementia which further complicates the presenting complaint. The complaint involves an extensive differential diagnosis and also carries with it a high risk of complications and morbidity.    The differential diagnosis includes ICH, mass effect, cervical spine fracture, blunt thoracic or abdominal trauma, syncope, arrhythmia, anemia, dehydration, electrolyte abnormality, infection  Additional history obtained: Additional history obtained from EMS  Records reviewed Nursing Home Documents  ED Course and Reassessment: Patient was made a prehospital arrival level 1 trauma and was immediately present at bedside, trauma surgery arrived currently after patient's arrival.  Primary survey was intact.  Secondary survey was significant for GCS of 11, mildly unequal pupil side and a posterior scalp hematoma.  Patient was placed in c-collar on arrival.  Patient will have CT head, C-spine and chest abdomen and pelvis to evaluate  for traumatic injuries as well as have labs and EKG to evaluate for cause of his fall and he will be closely reassessed.  Independent labs interpretation:  The following labs were independently interpreted: pending  Independent visualization of imaging: - I independently visualized the following imaging with scope of interpretation limited to determining acute life threatening conditions related to emergency care: CTH/C-spine, CT CAP, which revealed multiple rib fractures  Consultation: - Consulted or discussed management/test interpretation w/ external professional: trauma  Consideration for admission or further workup: patient requires admission for pain control and monitoring Social Determinants of health: N/A    Amount and/or Complexity of Data Reviewed Labs: ordered. Radiology: ordered.  Risk Prescription drug management. Decision regarding hospitalization.          Final Clinical Impression(s) / ED Diagnoses Final diagnoses:  Closed fracture of multiple ribs, unspecified laterality, initial encounter  Fall, initial encounter    Rx / DC Orders ED Discharge Orders     None  Rexford Maus, DO 01/09/23 1651

## 2023-01-09 NOTE — H&P (Signed)
Mosses Mcdill Rothenberger Oct 15, 1945  [id number].    Chief Complaint/Reason for Consult: Fall, unwitnessed  HPI:  This is a 77 yo white male who currently resides at [facility] has a history of Alzheimer's dementia, CAD, a fib on Xarelto, HTN, HLD, OSA on CPAP, bipolar disorder, and anemia who recently travelled to [facility] and had a fall and was admitted and found to have no injuries and generalized weakness.  He was discharged but then admitted to [facility] two days later secondary to another fall.  He was then discharged to [facility]/SNF.  He apparently has been having behavioral issues related to his dementia and the recommendation has been for DC from SNF to a memory care unit.    Today around 9am he was found on the floor after and unwitnessed fall.  He was neurologically at his baseline at that time.  Throughout the day he has become more lethargic.  He was brought into the ED as a level 1 secondary to mechanism and on anticoagulation.  He was given his Xarelto this morning according to the notes from [facility].  He arrives with a GCS of 9.  As he has been here longer this has improved and he is now spontaneously opening his eyes and now intelligibly speaking at times.  He is still fairly lethargic otherwise though.  Trauma work up has revealed L 8-10 rib fxs, and several other incidentalomas.    ROS: ROS: unable due to mental status  History reviewed. No pertinent family history.  History reviewed. No pertinent past medical history. See above.  History reviewed from SNF charting  History reviewed. No pertinent surgical history.  Social History:  has no history on file for tobacco use, alcohol use, and drug use.  Allergies:  Allergies  Allergen Reactions   Latex     (Not in a hospital admission)    Physical Exam: Blood pressure 125/72, pulse 74, resp. rate (!) 26, height 5\' 11"  (1.803 m), weight 70.3 kg, SpO2 100%. General: elderly, lethargic, white male who is laying in bed in NAD HEENT:  head is normocephalic, with small scalp hematoma over his central occiput.  Scab is present over this so this may be subacute.  Sclera are noninjected.  PERRL, but 2mm in size.  Ears and nose without any masses or lesions.  Mouth is pink and moist Neck: c-collar in place in trauma bay, removed in bay after imaging. Heart: irregular, rate controlled.  Normal s1,s2. No obvious murmurs, gallops, or rubs noted.  Palpable radial and pedal pulses bilaterally Lungs: CTAB, no wheezes, rhonchi, or rales noted.  Respiratory effort nonlabored.  Completely nontedner on exam, no bruising noted. Abd: soft, NT, ND, +BS, no masses, hernias, or organomegaly MS: all 4 extremities are symmetrical with no cyanosis, clubbing, or edema. Skin: warm and dry with no masses, lesions, or rashes Neuro: GCS 9 on arrival, improved to GCS of 14 but still lethargic and goes to sleep very quickly. Psych: unable   No results found for this or any previous visit (from the past 48 hour(s)). No results found.    Assessment/Plan Unwitnessed Fall Possible concussion - TBI therapies L 8-10 rib fxs - IS, pulm toilet, pain control, muscle relaxers prn OSA - CPAP at night Age indeterminate T3 vertebral endplate deformity - no intervention Small fluid around R kidney - no evidence of injury Soft tissue mass in RLQ, can't rule out carcinoid - outpatient follow up ? Mass in TI/cecum - outpatient work up  2 non solid nodules in LLL - follow up CT outpatient in 3-6 months CAD A fib - on Xarelto at home, on hold for now HTN HLD Anemia Bipolar disorder Dysphagia - SLP to see, wife states he is on pureed diet at the facility, pills with applesauce FEN - npo, IVFs, SLP to see VTE - Lovenox, Xarelto on hold ID - none needed Admit - observation, med-surg  I reviewed last 24 h vitals and pain scores, last 48 h intake and output, last 24 h labs and trends, and last 24 h imaging results.  Letha Cape, [facility]  Surgery 01/09/2023, 4:44 PM Please see Amion for pager number during day hours 7:00am-4:30pm or 7:00am -11:30am on weekends

## 2023-01-10 ENCOUNTER — Inpatient Hospital Stay (HOSPITAL_COMMUNITY): Payer: Medicare PPO

## 2023-01-10 LAB — BASIC METABOLIC PANEL
Anion gap: 6 (ref 5–15)
BUN: 22 mg/dL (ref 8–23)
CO2: 27 mmol/L (ref 22–32)
Calcium: 9.5 mg/dL (ref 8.9–10.3)
Chloride: 106 mmol/L (ref 98–111)
Creatinine, Ser: 1.19 mg/dL (ref 0.61–1.24)
GFR, Estimated: >60 mL/min (ref >60)
Glucose, Bld: 116 mg/dL — ABNORMAL HIGH (ref 70–99)
Potassium: 4.2 mmol/L (ref 3.5–5.1)
Sodium: 139 mmol/L (ref 135–145)

## 2023-01-10 LAB — CBC
HCT: 32 % — ABNORMAL LOW (ref 39.0–52.0)
Hemoglobin: 10.9 g/dL — ABNORMAL LOW (ref 13.0–17.0)
MCH: 31.7 pg (ref 26.0–34.0)
MCHC: 34.1 g/dL (ref 30.0–36.0)
MCV: 93 fL (ref 80.0–100.0)
Platelets: 117 10*3/uL — ABNORMAL LOW (ref 150–400)
RBC: 3.44 MIL/uL — ABNORMAL LOW (ref 4.22–5.81)
RDW: 13.2 % (ref 11.5–15.5)
WBC: 12.1 10*3/uL — ABNORMAL HIGH (ref 4.0–10.5)
nRBC: 0 % (ref 0.0–0.2)

## 2023-01-10 MED ORDER — DIVALPROEX SODIUM 250 MG PO DR TAB
250.0000 mg | DELAYED_RELEASE_TABLET | Freq: Every day | ORAL | Status: DC
  Administered 2023-01-11 – 2023-01-12 (×2): 250 mg via ORAL
  Filled 2023-01-10 (×2): qty 1

## 2023-01-10 MED ORDER — DIVALPROEX SODIUM 500 MG PO DR TAB
500.0000 mg | DELAYED_RELEASE_TABLET | Freq: Every day | ORAL | Status: DC
  Administered 2023-01-10 – 2023-01-11 (×2): 500 mg via ORAL
  Filled 2023-01-10 (×3): qty 1

## 2023-01-10 MED ORDER — LAMOTRIGINE 150 MG PO TABS
150.0000 mg | ORAL_TABLET | Freq: Two times a day (BID) | ORAL | Status: DC
  Administered 2023-01-10 – 2023-01-23 (×24): 150 mg via ORAL
  Filled 2023-01-10 (×27): qty 1

## 2023-01-10 NOTE — Evaluation (Signed)
Clinical/Bedside Swallow Evaluation Patient Details  Name: [name] [name] MRN: [id number] Date of Birth: December 21, 1945  Today's Date: 01/10/2023 Time: SLP Start Time (ACUTE ONLY): 0945 SLP Stop Time (ACUTE ONLY): 1000 SLP Time Calculation (min) (ACUTE ONLY): 15 min  HPI:  This is a 77 yo white male who currently resides at [facility] has a history of Alzheimer's dementia, CAD, a fib on Xarelto, HTN, HLD, OSA on CPAP, bipolar disorder, and anemia who recently travelled to [facility] and had a fall and was admitted and found to have no injuries and generalized weakness.  He was discharged but then admitted to [facility] two days later secondary to another fall.  He was then discharged to [facility]/SNF.  He apparently has been having behavioral issues related to his dementia and the recommendation has been for DC from SNF to a memory care unit.  On 01/09/23 around 9am he was found on the floor after and unwitnessed fall.  He was neurologically at his baseline at that time.  Throughout the day he has become more lethargic.  He was brought into the ED as a level 1 trauma.  He arrives with a GCS of 9.  As he has been here longer this has improved and he is now spontaneously opening his eyes and now intelligibly speaking at times.  He is still fairly lethargic otherwise though.  Trauma work up has revealed L 8-10 rib fxs, and concussion.  ST consulted for swallow evaluation.  Pt currently NPO.    Assessment / Plan / Recommendation  Clinical Impression  Pt seen for clinical swallow evaluation with impaired mentation and max visual/tactile cues provided to maintain alertness level during assessment.  Pt demonstrated decreased oral manipulation, but did masticate ice chips given max cues by SLP/PT to maintain alertness level.  Oral holding noted with all consistencies including ice chips (single), thin via cup/straw and puree.  Suspected delay in the initiation of the swallow observed, but cognitive-based dysphagia likely at  baseline.  No solids attempted d/t impaired mentation.  Pt was noted to be impulsive with straw sips of thin liquids despite mod cues provided by SLP to reduce rate and take smaller sips.  No overt s/sx of aspiration observed, but pt is at risk for aspiration d/t decreased alertness level, hx of cognitive impairment and medical concerns related to fall.  Recommend medications wholein puree with small sips of thin/ice chips provided with FULL supervision by nursing staff.  ST will continue to f/u for po readiness in acute setting.  Thank you for this consult. SLP Visit Diagnosis: Dysphagia, oropharyngeal phase (R13.12)    Aspiration Risk  Moderate aspiration risk    Diet Recommendation   NPO (sips of water, ice chips provided with FULL supervision; meds whole in puree)  Medication Administration: Whole meds with puree    Other  Recommendations Oral Care Recommendations: Oral care prior to ice chip/H20;Oral care QID;Staff/trained caregiver to provide oral care    Recommendations for follow up therapy are one component of a multi-disciplinary discharge planning process, led by the attending physician.  Recommendations may be updated based on patient status, additional functional criteria and insurance authorization.  Follow up Recommendations Skilled nursing-short term rehab (<3 hours/day) (memory care unit pending)      Assistance Recommended at Discharge  FULL  Functional Status Assessment Patient has had a recent decline in their functional status and/or demonstrates limited ability to make significant improvements in function in a reasonable and predictable amount of time  Frequency  and Duration min 2x/week  1 week       Prognosis Prognosis for improved oropharyngeal function: Fair Barriers to Reach Goals: Cognitive deficits;Severity of deficits      Swallow Study   General Date of Onset: 01/09/23 HPI: This is a 77 yo white male who currently resides at [facility] has a  history of Alzheimer's dementia, CAD, a fib on Xarelto, HTN, HLD, OSA on CPAP, bipolar disorder, and anemia who recently travelled to [facility] and had a fall and was admitted and found to have no injuries and generalized weakness.  He was discharged but then admitted to [facility] two days later secondary to another fall.  He was then discharged to [facility]SNF.  He apparently has been having behavioral issues related to his dementia and the recommendation has been for DC from SNF to a memory care unit.  On 01/09/23 around 9am he was found on the floor after and unwitnessed fall.  He was neurologically at his baseline at that time.  Throughout the day he has become more lethargic.  He was brought into the ED as a level 1 trauma.  He arrives with a GCS of 9.  As he has been here longer this has improved and he is now spontaneously opening his eyes and now intelligibly speaking at times.  He is still fairly lethargic otherwise though.  Trauma work up has revealed L 8-10 rib fxs, and concussion.  ST consulted for swallow evaluation.  Pt currently NPO. Type of Study: Bedside Swallow Evaluation Previous Swallow Assessment: n/a Diet Prior to this Study: NPO Temperature Spikes Noted: No Respiratory Status: Room air History of Recent Intubation: No Behavior/Cognition: Confused;Lethargic/Drowsy;Requires cueing Oral Cavity Assessment: Other (comment) (DTA d/t impaired cognition/lethargy) Oral Care Completed by SLP: No Oral Cavity - Dentition: Missing dentition Self-Feeding Abilities: Needs assist Patient Positioning: Upright in bed Baseline Vocal Quality: Low vocal intensity Volitional Cough: Cognitively unable to elicit Volitional Swallow: Unable to elicit    Oral/Motor/Sensory Function Overall Oral Motor/Sensory Function: Generalized oral weakness   Ice Chips Ice chips: Impaired Presentation: Spoon Oral Phase Impairments: Reduced lingual movement/coordination Oral Phase Functional Implications: Oral holding;Prolonged  oral transit Pharyngeal Phase Impairments: Suspected delayed Swallow   Thin Liquid Thin Liquid: Impaired Presentation: Cup;Straw;Spoon Oral Phase Functional Implications: Oral holding Pharyngeal  Phase Impairments: Suspected delayed Swallow;Other (comments) (impulsive)    Nectar Thick Nectar Thick Liquid: Not tested   Honey Thick Honey Thick Liquid: Not tested   Puree Puree: Impaired Presentation: Spoon Oral Phase Impairments: Reduced lingual movement/coordination Oral Phase Functional Implications: Prolonged oral transit;Oral holding Pharyngeal Phase Impairments: Suspected delayed Swallow   Solid     Solid: Not tested      Pat Welda Azzarello,M.S.,CCC-SLP 01/10/2023,10:35 AM

## 2023-01-10 NOTE — Progress Notes (Signed)
Patient's wife is at the bedside, reports at SNF patient is on a puree diet and thickened liquids. Patient was able to take PO medications crushed in puree. Slow to swallow and required multiple reminders to swallow

## 2023-01-10 NOTE — Evaluation (Signed)
Physical Therapy Evaluation Patient Details Name: [name] [name] MRN: [id number] DOB: 05-08-1945 Today's Date: 01/10/2023  History of Present Illness  Pt is a 77 yo male admitted 11/8 for unwitnessed fall from [facility], CT scan 11/8 no signs of cervical fx, acute fx L ribs 8-10. PMH: Alzheimer's dementia, CAD, Afib on Xarelto, HTN, HLD, OSA on CPAP, bipolar disorder, and anemia  Clinical Impression  Pt very lethargic throughout session. Pt required assistance in all aspects of mobility. Bed mobility required LE movement by therapist OOB and modA to pivot up to seated EOB. minA during STS and ambulation. Ambulated using 2 person hand held assist, pt attempted to with RW, he had difficulty staying within bounds of RW and controlling it during propulsion. Pt exhibits slow and unstable gait, at risk for further falls. Pt was left in bed due to nursing request prior to session. Pt would benefit from continued skilled acute physical therapy to increase function and safety. Patient will benefit from continued inpatient follow up therapy, <3 hours/day if different from baseline, no family present and patient unable to clarify PLOF.         If plan is discharge home, recommend the following: Two people to help with walking and/or transfers;Two people to help with bathing/dressing/bathroom;Assistance with cooking/housework;Assist for transportation;Help with stairs or ramp for entrance;Supervision due to cognitive status   Can travel by private vehicle   Yes    Equipment Recommendations None recommended by PT  Recommendations for Other Services       Functional Status Assessment Patient has had a recent decline in their functional status and demonstrates the ability to make significant improvements in function in a reasonable and predictable amount of time.     Precautions / Restrictions Precautions Precautions: Fall Restrictions Weight Bearing Restrictions: No      Mobility  Bed  Mobility Overal bed mobility: Needs Assistance Bed Mobility: Supine to Sit     Supine to sit: HOB elevated, Mod assist     General bed mobility comments: required assistance getting legs OOB, low arousal in bed required modA to pivot up into sitting, would likely need less assistance if more alert    Transfers Overall transfer level: Needs assistance Equipment used: Rolling walker (2 wheels) Transfers: Sit to/from Stand Sit to Stand: Min assist           General transfer comment: required cueing for initiation of task, minA for facilitation of stand    Ambulation/Gait Ambulation/Gait assistance: Min assist Gait Distance (Feet): 150 Feet Assistive device: 2 person hand held assist Gait Pattern/deviations: Decreased step length - left, Decreased step length - right, Decreased stride length, Knee flexed in stance - left, Knee flexed in stance - right, Shuffle, Drifts right/left, Narrow base of support Gait velocity: decreased     General Gait Details: pt had forward lean in trunk, trialed RW for initial ambulation pt was unable to stay within bounds of RW consistantly and had difficulty controlling it  Careers information officer     Tilt Bed    Modified Rankin (Stroke Patients Only)       Balance Overall balance assessment: Needs assistance Sitting-balance support: Feet unsupported, Feet supported, No upper extremity supported, Bilateral upper extremity supported Sitting balance-Leahy Scale: Fair Sitting balance - Comments: Seated EOB during SLP evaluation with so support   Standing balance support: Reliant on assistive device for balance, During functional activity, Bilateral upper extremity supported Standing  balance-Leahy Scale: Poor Standing balance comment: extremely narrow base of support, requires assistance to stay stable in standing                             Pertinent Vitals/Pain Pain Assessment Pain Assessment: No/denies  pain    Home Living Family/patient expects to be discharged to:: Skilled nursing facility                   Additional Comments: No family present to provide PLOF at SNF    Prior Function Prior Level of Function : Patient poor historian/Family not available                     Extremity/Trunk Assessment   Upper Extremity Assessment Upper Extremity Assessment: Defer to OT evaluation;Overall [facility] for tasks assessed    Lower Extremity Assessment Lower Extremity Assessment: Overall WFL for tasks assessed    Cervical / Trunk Assessment Cervical / Trunk Assessment: Kyphotic  Communication   Communication Communication: Difficulty communicating thoughts/reduced clarity of speech;Difficulty following commands/understanding Following commands: Follows one step commands with increased time Cueing Techniques: Verbal cues;Gestural cues;Tactile cues  Cognition Arousal: Lethargic Behavior During Therapy: Flat affect Overall Cognitive Status: History of cognitive impairments - at baseline                                 General Comments: Extremely lethargic, pt was given haldol prior to evaluation, would keep eyes closed during majority of session, oriented to self, would answer most questions with short responses        General Comments      Exercises     Assessment/Plan    PT Assessment Patient needs continued PT services  PT Problem List Decreased strength;Decreased balance;Decreased cognition;Decreased mobility;Decreased range of motion;Decreased activity tolerance;Decreased coordination;Decreased safety awareness       PT Treatment Interventions DME instruction;Therapeutic activities;Therapeutic exercise;Gait training;Stair training;Balance training;Functional mobility training    PT Goals (Current goals can be found in the Care Plan section)  Acute Rehab PT Goals PT Goal Formulation: Patient unable to participate in goal setting    Frequency  Min 1X/week     Co-evaluation               AM-PAC PT "6 Clicks" Mobility  Outcome Measure Help needed turning from your back to your side while in a flat bed without using bedrails?: A Little Help needed moving from lying on your back to sitting on the side of a flat bed without using bedrails?: A Lot Help needed moving to and from a bed to a chair (including a wheelchair)?: A Lot Help needed standing up from a chair using your arms (e.g., wheelchair or bedside chair)?: A Little Help needed to walk in hospital room?: A Lot Help needed climbing 3-5 steps with a railing? : Total 6 Click Score: 13    End of Session Equipment Utilized During Treatment: Gait belt Activity Tolerance: Patient limited by lethargy Patient left: in bed;with bed alarm set;with restraints reapplied;with SCD's reapplied;with call bell/phone within reach Nurse Communication: Mobility status PT Visit Diagnosis: Unsteadiness on feet (R26.81);Other abnormalities of gait and mobility (R26.89);Repeated falls (R29.6);History of falling (Z91.81);Muscle weakness (generalized) (M62.81);Difficulty in walking, not elsewhere classified (R26.2)    Time: 5284-1324 PT Time Calculation (min) (ACUTE ONLY): 32 min   Charges:   PT Evaluation $PT Eval Moderate Complexity: 1  Mod PT Treatments $Gait Training: 8-22 mins PT General Charges $$ ACUTE PT VISIT: 1 Visit         Andrey Farmer. SPT Secure chat preferred   Darlin Drop 01/10/2023, 10:54 AM

## 2023-01-10 NOTE — TOC CAGE-AID Note (Signed)
Transition of Care [facility]) - CAGE-AID Screening   Patient Details  Name: [name] [name] MRN: [id number] Date of Birth: 05/22/1945   Hewitt Shorts, RN Trauma Response Nurse Phone Number: [phone] 01/10/2023, 10:53 AM    CAGE-AID Screening: Substance Abuse Screening unable to be completed due to: : Patient unable to participate (pt is confused)

## 2023-01-10 NOTE — Progress Notes (Signed)
[facility] Progress Note     Subjective: CC:   Haldol overnight bc trying to get OOB. Arouses to voice today temporarily before falling back asleep. Oriented to self and hospital. Denies pain   AFVSS   Objective: Vital signs in last 24 hours: Temp:  [98.2 F (36.8 C)-98.5 F (36.9 C)] 98.5 F (36.9 C) (11/09 0853) Pulse Rate:  [72-88] 80 (11/09 0853) Resp:  [15-26] 17 (11/09 0853) BP: (98-136)/(55-72) 114/61 (11/09 0853) SpO2:  [94 %-100 %] 95 % (11/09 0853) Weight:  [70.3 kg] 70.3 kg (11/08 1639) Last BM Date :  (unk)  Intake/Output from previous day: 11/08 0701 - 11/09 0700 In: 825 [I.V.:825] Out: 150 [Urine:150] Intake/Output this shift: No intake/output data recorded.  PE: Gen:  Alert, NAD, pleasant Card: irregular - afib on monitor, HR 80's, pedal pulses 2+ BL Pulm:  Normal effort on room air, clear to auscultation bilaterally Abd: Soft, non-tender, non-distended, +BS Skin: warm and dry, no rashes  Psych: A&Ox3   Lab Results:  Recent Labs    01/09/23 1651 01/09/23 1703 01/10/23 0656  WBC 13.7*  --  12.1*  HGB 11.8* 12.9* 10.9*  HCT 36.0* 38.0* 32.0*  PLT 120*  --  117*   BMET Recent Labs    01/09/23 1651 01/09/23 1703 01/10/23 0656  NA 138 138 139  K 4.6 4.5 4.2  CL 101 99 106  CO2 27  --  27  GLUCOSE 136* 139* 116*  BUN 25* 26* 22  CREATININE 1.44* 1.30* 1.19  CALCIUM 10.1  --  9.5   PT/INR Recent Labs    01/09/23 1651  LABPROT 26.9*  INR 2.5*   CMP     Component Value Date/Time   NA 139 01/10/2023 0656   K 4.2 01/10/2023 0656   CL 106 01/10/2023 0656   CO2 27 01/10/2023 0656   GLUCOSE 116 (H) 01/10/2023 0656   BUN 22 01/10/2023 0656   CREATININE 1.19 01/10/2023 0656   CALCIUM 9.5 01/10/2023 0656   PROT 6.7 01/09/2023 1651   ALBUMIN 3.2 (L) 01/09/2023 1651   AST 22 01/09/2023 1651   ALT 10 01/09/2023 1651   ALKPHOS 118 01/09/2023 1651   BILITOT 1.0 01/09/2023 1651   GFRNONAA >60 01/10/2023 0656   Lipase   No results found for: "LIPASE"     Studies/Results: DG CHEST PORT 1 VIEW  Result Date: 01/10/2023 CLINICAL DATA:  Multiple left rib fractures. EXAM: PORTABLE CHEST 1 VIEW COMPARISON:  Chest CT 01/09/2023 FINDINGS: Low volume film. The lungs are clear without focal pneumonia, edema, pneumothorax or pleural effusion. Cardiopericardial silhouette is at upper limits of normal for size. Telemetry leads overlie the chest. The multiple anterior left rib fracture seen on chest CT are not evident by x-ray. IMPRESSION: 1. Low volume film without acute cardiopulmonary findings. 2. Multiple anterior left rib fractures seen on chest CT are not evident by x-ray. No evidence for pneumothorax. Electronically Signed   By: Kennith Center M.D.   On: 01/10/2023 07:25   DG Pelvis Portable  Result Date: 01/09/2023 CLINICAL DATA:  Trauma, pain. EXAM: PORTABLE PELVIS 1-2 VIEWS COMPARISON:  Subsequent CT reformats, available at time of radiograph interpretation. FINDINGS: Left iliac crest is excluded from the field of view. No acute pelvic fracture. Intact right hip arthroplasty. Pubic rami are intact. Left hip osteoarthritis. No diastasis of pubic symphysis or sacroiliac joints. IMPRESSION: 1. No acute pelvic fracture. 2. Intact right hip arthroplasty. 3. Left hip osteoarthritis. Electronically Signed   By:  Narda Rutherford M.D.   On: 01/09/2023 19:01   DG Chest Port 1 View  Result Date: 01/09/2023 CLINICAL DATA:  Trauma, pain. EXAM: PORTABLE CHEST 1 VIEW COMPARISON:  Subsequent chest CT, available at time of radiograph interpretation. Chest radiograph 01/03/2023. FINDINGS: Stable heart size and mediastinal contours. Left chest wall loop recorder. No pneumothorax. Calcified mediastinal adenopathy. Left rib fractures on CT are not well demonstrated on the current exam. IMPRESSION: Stable cardiomegaly. Left rib fractures on subsequent CT are not well demonstrated on the current exam. Electronically Signed   By: Narda Rutherford M.D.   On: 01/09/2023 19:00   CT HEAD WO CONTRAST  Result Date: 01/09/2023 CLINICAL DATA:  Head trauma, moderate-severe; Polytrauma, blunt. EXAM: CT HEAD WITHOUT CONTRAST CT CERVICAL SPINE WITHOUT CONTRAST TECHNIQUE: Multidetector CT imaging of the head and cervical spine was performed following the standard protocol without intravenous contrast. Multiplanar CT image reconstructions of the cervical spine were also generated. RADIATION DOSE REDUCTION: This exam was performed according to the departmental dose-optimization program which includes automated exposure control, adjustment of the mA and/or kV according to patient size and/or use of iterative reconstruction technique. COMPARISON:  CT head 01/04/2023.  CT cervical spine 12/19/2021. FINDINGS: CT HEAD FINDINGS Brain: There is no evidence of an acute infarct, intracranial hemorrhage, mass, midline shift, or extra-axial fluid collection. Chronic right MCA territory infarcts are again noted involving the frontal and parietal lobes. Hypodensities in the cerebral white matter bilaterally are unchanged and nonspecific but compatible with moderate chronic small vessel ischemic disease. A chronic infarct is also again noted in the right midbrain/cerebral peduncle. There is mild-to-moderate cerebral atrophy. Vascular: No hyperdense vessel. Skull: No acute fracture or suspicious osseous lesion. Sinuses/Orbits: Visualized paranasal sinuses and mastoid air cells are clear. Unremarkable orbits. Other: Parietal scalp contusion. CT CERVICAL SPINE FINDINGS Alignment: Chronic straightening of the normal cervical lordosis and grade 1 anterolisthesis of C4 on C5. Skull base and vertebrae: No acute fracture or suspicious osseous lesion. Asymmetrically severe right-sided C1-2 arthropathy. Soft tissues and spinal canal: No prevertebral fluid or swelling. No visible canal hematoma. Disc levels: Bulky anterior vertebral osteophytosis with interbody ankylosis from C2-T1.  Facet ankylosis on the left at C2-3 and on the right at C3-4. Upper chest: Reported separately. Other: None. Preliminary findings were communicated in person to Dr. Violeta Gelinas on 01/09/2023 at 4:17 p.m. IMPRESSION: 1. No evidence of acute intracranial abnormality or acute cervical spine fracture. 2. Chronic ischemia with multiple old infarcts. Electronically Signed   By: Sebastian Ache M.D.   On: 01/09/2023 18:02   CT CERVICAL SPINE WO CONTRAST  Result Date: 01/09/2023 CLINICAL DATA:  Head trauma, moderate-severe; Polytrauma, blunt. EXAM: CT HEAD WITHOUT CONTRAST CT CERVICAL SPINE WITHOUT CONTRAST TECHNIQUE: Multidetector CT imaging of the head and cervical spine was performed following the standard protocol without intravenous contrast. Multiplanar CT image reconstructions of the cervical spine were also generated. RADIATION DOSE REDUCTION: This exam was performed according to the departmental dose-optimization program which includes automated exposure control, adjustment of the mA and/or kV according to patient size and/or use of iterative reconstruction technique. COMPARISON:  CT head 01/04/2023.  CT cervical spine 12/19/2021. FINDINGS: CT HEAD FINDINGS Brain: There is no evidence of an acute infarct, intracranial hemorrhage, mass, midline shift, or extra-axial fluid collection. Chronic right MCA territory infarcts are again noted involving the frontal and parietal lobes. Hypodensities in the cerebral white matter bilaterally are unchanged and nonspecific but compatible with moderate chronic small vessel ischemic disease. A chronic  infarct is also again noted in the right midbrain/cerebral peduncle. There is mild-to-moderate cerebral atrophy. Vascular: No hyperdense vessel. Skull: No acute fracture or suspicious osseous lesion. Sinuses/Orbits: Visualized paranasal sinuses and mastoid air cells are clear. Unremarkable orbits. Other: Parietal scalp contusion. CT CERVICAL SPINE FINDINGS Alignment: Chronic  straightening of the normal cervical lordosis and grade 1 anterolisthesis of C4 on C5. Skull base and vertebrae: No acute fracture or suspicious osseous lesion. Asymmetrically severe right-sided C1-2 arthropathy. Soft tissues and spinal canal: No prevertebral fluid or swelling. No visible canal hematoma. Disc levels: Bulky anterior vertebral osteophytosis with interbody ankylosis from C2-T1. Facet ankylosis on the left at C2-3 and on the right at C3-4. Upper chest: Reported separately. Other: None. Preliminary findings were communicated in person to Dr. Violeta Gelinas on 01/09/2023 at 4:17 p.m. IMPRESSION: 1. No evidence of acute intracranial abnormality or acute cervical spine fracture. 2. Chronic ischemia with multiple old infarcts. Electronically Signed   By: Sebastian Ache M.D.   On: 01/09/2023 18:02   CT CHEST ABDOMEN PELVIS W CONTRAST  Result Date: 01/09/2023 CLINICAL DATA:  Unwitnessed fall. EXAM: CT CHEST, ABDOMEN, AND PELVIS WITH CONTRAST TECHNIQUE: Multidetector CT imaging of the chest, abdomen and pelvis was performed following the standard protocol during bolus administration of intravenous contrast. RADIATION DOSE REDUCTION: This exam was performed according to the departmental dose-optimization program which includes automated exposure control, adjustment of the mA and/or kV according to patient size and/or use of iterative reconstruction technique. CONTRAST:  75mL OMNIPAQUE IOHEXOL 350 MG/ML SOLN, <See Chart> OMNIPAQUE IOHEXOL 350 MG/ML SOLN COMPARISON:  None Available. FINDINGS: CT CHEST FINDINGS Cardiovascular: Mild cardiac enlargement aortic atherosclerosis. Three vessel coronary artery calcifications. No pericardial effusion. Mediastinum/Nodes: Calcified mediastinal and hilar lymph nodes. Thyroid gland, trachea and esophagus appear normal. Lungs/Pleura: No pleural effusion. Subsegmental atelectasis noted in the posterior lung bases, left greater than right. Non solid nodule in the superior  segment of the left lower lobe measures 1.6 cm, image 75/8. Calcified nodules within the lingula, image 88/8. Within the superior segment of the left lower lobe there is a 9 mm ground-glass nodule, image 62/8. No pneumothorax identified. Musculoskeletal: The fractures are identified involving the lateral and aspect of the left tenth, ninth and eighth ribs. CT ABDOMEN PELVIS FINDINGS Hepatobiliary: No hepatic injury or perihepatic hematoma. Gallbladder is unremarkable. Pancreas: Unremarkable. No pancreatic ductal dilatation or surrounding inflammatory changes. Spleen: No splenic injury or perisplenic hematoma. Calcified granulomas identified. Adrenals/Urinary Tract: Normal appearance of the adrenal glands. Simple appearing exophytic cyst arises off the anterolateral cortex of the left kidney measuring 3.5 cm, image 69/7. There is a small amount of fluid adjacent to the lower pole of the right kidney, image 78/2. No signs of renal laceration or contusion. Asymmetric left renal cortical scarring and volume loss. Mild bladder wall thickening with surrounding haziness. Stomach/Bowel: The stomach appears within normal limits. There is no pathologic dilatation of the large or small bowel loops. Bowel anatomy in the right lower quadrant is difficult to evaluate reflecting lack of enteric contrast material. There is a suspected filling defect within the either the cecum or distal ileum which appears hyperdense and may represent an underlying neoplasm. Vascular/Lymphatic: Aortic atherosclerosis. No aneurysm. The abdominal vascularity appears patent. In the right lower quadrant of the abdomen adjacent to the ileum and cecum there is a soft tissue mass containing internal calcifications measuring 3.3 x 1.9 by 2.7 cm. No retroperitoneal or pelvic adenopathy. Reproductive: Prostate gland enlargement. Other: No significant free fluid or fluid  collections. No signs of pneumoperitoneum. Musculoskeletal: Previous right hip  arthroplasty. Bones appear diffusely osteopenic. Multilevel lumbar degenerative disc disease. Lumbar vertebral body heights are well preserved without signs of fracture. IMPRESSION: 1. Acute fractures involving the lateral aspects of the left tenth, ninth and eighth ribs. No signs of pneumothorax. 2. Subtle, right anterior superior endplate deformity involving the T3 vertebra compatible with an age-indeterminate fracture. 3. Small amount of low-attenuation fluid adjacent to the lower pole of the right kidney. No signs of renal laceration or contusion. 4. In the right lower quadrant of the abdomen, adjacent to the ileum and cecum there is a soft tissue mass containing internal calcifications measuring 3.3 x 1.9 x 2.7 cm. Cannot rule out underlying carcinoid tumor. 5. Bowel anatomy in the right lower quadrant is difficult to evaluate reflecting lack of enteric contrast material. There is a suspected filling defect within the either the cecum or distal ileum which appears hyperdense and may represent an underlying neoplasm. Recommend follow-up imaging with CT of the abdomen pelvis with oral and IV contrast material as well as visualization with colonoscopy. 6. Two non solid nodules in the left lower lobe are noted measuring up to 1.6 cm. Non-contrast chest CT at 3-6 months is recommended. If nodules persist, subsequent management will be based upon the most suspicious nodule(s). This recommendation follows the consensus statement: Guidelines for Management of Incidental Pulmonary Nodules Detected on CT Images: From the Fleischner Society 2017; Radiology 2017; 284:228-243. 7. Mild bladder wall thickening with surrounding 2 haziness. Correlate for any signs/symptoms of cystitis. 8. Coronary artery calcifications. 9. Prostate gland enlargement. 10.  Aortic Atherosclerosis (ICD10-I70.0). These results were called by telephone at the time of interpretation on 01/09/2023 at 4:48 pm to provider Violeta Gelinas, MD, who  acknowledged these results. Electronically Signed   By: Signa Kell M.D.   On: 01/09/2023 16:49    Anti-infectives: Anti-infectives (From admission, onward)    None        Assessment/Plan  Unwitnessed Fall Possible concussion - TBI therapies L 8-10 rib fxs - IS, pulm toilet, pain control, muscle relaxers prn OSA - CPAP at night Age indeterminate T3 vertebral endplate deformity - no intervention Small fluid around R kidney - no evidence of injury Soft tissue mass in RLQ, can't rule out carcinoid - outpatient follow up ? Mass in TI/cecum - outpatient work up 2 non solid nodules in LLL - follow up CT outpatient in 3-6 months CAD A fib - on Xarelto at home, on hold for now; hgb 10.9 from 11.8 yesterday. Plts 117 from 120.  HTN HLD Anemia Bipolar disorder Dysphagia - SLP to see, wife states he is on pureed diet at the facility, pills with applesauce  FEN - npo, IVFs at 75 mL/hr, SLP to see VTE - Lovenox, Xarelto on hold ID - none needed Admit - observation, med-surg, therapies.    LOS: 1 day   I reviewed nursing notes, ED provider notes, last 24 h vitals and pain scores, last 48 h intake and output, last 24 h labs and trends, and last 24 h imaging results.  This care required moderate level of medical decision making.   Hosie Spangle, PA-C [facility] Please see Amion for pager number during day hours 7:00am-4:30pm

## 2023-01-10 NOTE — Evaluation (Addendum)
Occupational Therapy Evaluation Patient Details Name: [name] [name] MRN: [id number] DOB: January 27, 1946 Today's Date: 01/10/2023   History of Present Illness Pt is a 77 yo male admitted 11/8 for unwitnessed fall from [facility], CT scan 11/8 no signs of cervical fx, acute fx L ribs 8-10. PMH: Alzheimer's dementia, CAD, Afib on Xarelto, HTN, HLD, OSA on CPAP, bipolar disorder, and anemia, recent hospitalization in October 2024 due to increased generalized weakness   Clinical Impression   At baseline, pt self-feeds with Supervision and cues, performs grooming tasks with Contact guard to Min assist with cues, and performs bathing, dressing, and toileting hygiene/clothing management with Max to Total assist with cues. At baseline, pt completes functional transfers/mobility Mod I to hand held assist +1. Pt now presents with obtunded alertness, decreased activity tolerance, decreased ability to follow 1-step instructions, generalized B UE weakness, and decreased safety and independence with functional tasks. Pt will benefit from acute skilled OT services to address deficits outlined below, decrease caregiver burden, and increase safety and independence with functional tasks, including self-feeding once pt's diet has been upgraded. Post acute discharge, pt will from continued intensive inpatient skilled rehab services < 3 hours per day to maximize rehab potential.        If plan is discharge home, recommend the following: Two people to help with walking and/or transfers;A lot of help with bathing/dressing/bathroom;Assistance with cooking/housework;Assistance with feeding;Direct supervision/assist for medications management;Direct supervision/assist for financial management;Assist for transportation;Help with stairs or ramp for entrance;Supervision due to cognitive status    Functional Status Assessment  Patient has had a recent decline in their functional status and demonstrates the ability to make  significant improvements in function in a reasonable and predictable amount of time.  Equipment Recommendations  Other (comment) (defer to next level of care)    Recommendations for Other Services       Precautions / Restrictions Precautions Precautions: Fall Restrictions Weight Bearing Restrictions: No      Mobility Bed Mobility Overal bed mobility: Needs Assistance Bed Mobility: Rolling Rolling: Mod assist, Used rails (with Max verbal and tactile cues)         General bed mobility comments: deferred sit<->supine this session for pt/therapist safety due to pt's current decreased level of alertness    Transfers Overall transfer level: Needs assistance                 General transfer comment: deferred transfers this session for pt/therapist safety due to pt's current decreased level of alertness; earlier this day, pt performs sit<->stand transfer with a RW with PT with Min assist      Balance                                           ADL either performed or assessed with clinical judgement   ADL Overall ADL's : Needs assistance/impaired Eating/Feeding: NPO   Grooming: Maximal assistance;Wash/dry hands;Wash/dry face;Cueing for sequencing;Bed level (use of oral swab; cues for initation; with increased time)   Upper Body Bathing: Maximal assistance;Total assistance;Bed level;Cueing for sequencing (cues for initation; with increased time) Upper Body Bathing Details (indicate cue type and reason): Pt largely Total assist with pt making good attempt to assist. Lower Body Bathing: Total assistance;Bed level   Upper Body Dressing : Maximal assistance;Cueing for sequencing;Bed level (cues for initation; with increased time)   Lower Body Dressing: Total assistance;Bed level  Toilet Transfer Details (indicate cue type and reason): deferred this session Toileting- Clothing Manipulation and Hygiene: Total assistance;Bed level         General  ADL Comments: Pt limited by level of alertness this session but made good attempts to participate, such as holding wash clothing and actively attempting to use  mouth swab with hand-over-hand assist.     Vision Baseline Vision/History: 1 Wears glasses (per chart review) Patient Visual Report: Other (comment) (pt unable to state)       Perception         Praxis         Pertinent Vitals/Pain Pain Assessment Pain Assessment: No/denies pain     Extremity/Trunk Assessment Upper Extremity Assessment Upper Extremity Assessment: Generalized weakness;LUE deficits/detail;RUE deficits/detail RUE Deficits / Details: generalized weakness; decreased coordination; PROM/AAROM WFL RUE Coordination: decreased fine motor;decreased gross motor LUE Deficits / Details: generalized weakness; decreased coordination; pt with PROM/AAROM shoulder flexion to approx. 80 degrees, PROM/AAROM otherwise WFL LUE Coordination: decreased fine motor;decreased gross motor   Lower Extremity Assessment Lower Extremity Assessment: Defer to PT evaluation   Cervical / Trunk Assessment Cervical / Trunk Assessment: Kyphotic   Communication Communication Communication: Difficulty communicating thoughts/reduced clarity of speech;Difficulty following commands/understanding Following commands: Follows one step commands inconsistently;Follows one step commands with increased time Cueing Techniques: Verbal cues;Tactile cues   Cognition Arousal: Obtunded, Suspect due to medications (Per RN, pt received haladol earlier this day.) Behavior During Therapy: Flat affect Overall Cognitive Status: History of cognitive impairments - at baseline                                 General Comments: Pt made good attempt to participate but was limited by currrent level of alertness. Pt oriented to self and that he is in a hospital. Pt kept eyes closed through majority of session. Pt largely providing 1-2 word responses to  questions with occasional short sentences used when stating he had been married to his wife for 40 years (per wife, they have been married 59 years) and that he played college basketball (per wife, pt also played college baseball). Pt able to follow 1-step instructions inconsistently with increased time and occasional verbal or tactile cues.     General Comments  VSS on RA throughout session    Exercises     Shoulder Instructions      Home Living Family/patient expects to be discharged to:: Skilled nursing facility                                 Additional Comments: Per chart review, prior to October 2024, pt lived at home with wife. Following hospitalization in October 2024, pt has been at [facility] for short-term rehab with plan to transfer to memory care at the same facility. Pt's wife and son present after eval complete and confirm history and PLOF during brief conversation.      Prior Functioning/Environment Prior Level of Function : Patient poor historian/Family not available;History of Falls (last six months);Needs assist;Other (comment) (Pt's wife and son present after eval complete and confirm history and PLOF during brief conversation.)             Mobility Comments: Per chart review, in October 2024, pt was able to ambulate to the bathroom in his home without an AD with Supervision. At that time, pt's wife reported  multiple falls in prior month. ADLs Comments: Per chart review, in October 2024, pt lived at home with wife who provided Max to Total assist with dressing, bathing, and toileting at baseline. At that time, pt able to self feed with set up and initiation cues.        OT Problem List: Decreased strength;Decreased activity tolerance;Decreased coordination      OT Treatment/Interventions: Self-care/ADL training;Cognitive remediation/compensation;Therapeutic activities;Patient/family education;DME and/or AE instruction    OT Goals(Current  goals can be found in the care plan section) Acute Rehab OT Goals Patient Stated Goal: Pt would like to see his wife. OT Goal Formulation: With patient/family Time For Goal Achievement: 01/24/23 Potential to Achieve Goals: Fair ADL Goals Pt Will Perform Eating: with supervision;sitting (with adaptive utensils as needed; with cues for initation, sequencing, and attention to task) Pt Will Perform Grooming: with contact guard assist;sitting (with adaptive equipment as needed; with cues for initation, sequencing, and attention to task) Pt Will Transfer to Toilet: with contact guard assist;ambulating;regular height toilet;grab bars (with cues for initation, sequencing, safety, and attention to task) Additional ADL Goal #1: Patient will demonstrate ability to perform bed mobility during/in preparation for functional tasks with Contact guard assist and cues for initation, sequencing, and attention to task.  OT Frequency: Min 1X/week    Co-evaluation              AM-PAC OT "6 Clicks" Daily Activity     Outcome Measure Help from another person eating meals?: Total (NPO) Help from another person taking care of personal grooming?: A Lot Help from another person toileting, which includes using toliet, bedpan, or urinal?: Total Help from another person bathing (including washing, rinsing, drying)?: Total Help from another person to put on and taking off regular upper body clothing?: A Lot Help from another person to put on and taking off regular lower body clothing?: Total 6 Click Score: 8   End of Session Nurse Communication: Mobility status;Other (comment) (Pt with obtunded alertness throughout session. Pt made good attempt to participate.)  Activity Tolerance: Patient limited by lethargy Patient left: in bed;with call bell/phone within reach;with bed alarm set  OT Visit Diagnosis: Ataxia, unspecified (R27.0);History of falling (Z91.81);Muscle weakness (generalized) (M62.81)                 Time: 1550-1606 OT Time Calculation (min): 16 min Charges:  OT General Charges $OT Visit: 1 Visit OT Evaluation $OT Eval Moderate Complexity: 1 Mod  [address] OTR/L, [place] Acute Rehab [phone]   Lendon Colonel 01/10/2023, 5:12 PM

## 2023-01-11 ENCOUNTER — Encounter (HOSPITAL_COMMUNITY): Payer: Self-pay | Admitting: General Surgery

## 2023-01-11 LAB — CBC
HCT: 31.9 % — ABNORMAL LOW (ref 39.0–52.0)
Hemoglobin: 10.3 g/dL — ABNORMAL LOW (ref 13.0–17.0)
MCH: 30.7 pg (ref 26.0–34.0)
MCHC: 32.3 g/dL (ref 30.0–36.0)
MCV: 94.9 fL (ref 80.0–100.0)
Platelets: 108 10*3/uL — ABNORMAL LOW (ref 150–400)
RBC: 3.36 MIL/uL — ABNORMAL LOW (ref 4.22–5.81)
RDW: 13.1 % (ref 11.5–15.5)
WBC: 8.5 10*3/uL (ref 4.0–10.5)
nRBC: 0 % (ref 0.0–0.2)

## 2023-01-11 LAB — BASIC METABOLIC PANEL
Anion gap: 9 (ref 5–15)
BUN: 19 mg/dL (ref 8–23)
CO2: 25 mmol/L (ref 22–32)
Calcium: 9.9 mg/dL (ref 8.9–10.3)
Chloride: 104 mmol/L (ref 98–111)
Creatinine, Ser: 1.14 mg/dL (ref 0.61–1.24)
GFR, Estimated: >60 mL/min (ref >60)
Glucose, Bld: 91 mg/dL (ref 70–99)
Potassium: 4.4 mmol/L (ref 3.5–5.1)
Sodium: 138 mmol/L (ref 135–145)

## 2023-01-11 LAB — MAGNESIUM: Magnesium: 2 mg/dL (ref 1.7–2.4)

## 2023-01-11 LAB — GLUCOSE, CAPILLARY
Glucose-Capillary: 112 mg/dL — ABNORMAL HIGH (ref 70–99)
Glucose-Capillary: 133 mg/dL — ABNORMAL HIGH (ref 70–99)

## 2023-01-11 MED ORDER — DEXTROSE-SODIUM CHLORIDE 5-0.9 % IV SOLN: INTRAVENOUS | Status: AC

## 2023-01-11 NOTE — Progress Notes (Signed)
[facility] Progress Note     Subjective: CC:   NAEO. Oriented to self. States he is doing alright. NPO per SLP and tolerating meds crushed in puree.   AFVSS   Objective: Vital signs in last 24 hours: Temp:  [97.7 F (36.5 C)-98.3 F (36.8 C)] 97.7 F (36.5 C) (11/10 0749) Pulse Rate:  [63-92] 77 (11/10 0749) Resp:  [16-20] 16 (11/10 0749) BP: (112-144)/(59-85) 143/71 (11/10 0749) SpO2:  [97 %-100 %] 100 % (11/10 0749) Last BM Date :  (unk)  Intake/Output from previous day: 11/09 0701 - 11/10 0700 In: 760.8 [I.V.:760.8] Out: 525 [Urine:525] Intake/Output this shift: No intake/output data recorded.  PE: Gen:  somnolent, just waking up, oriented to self Card: irregular - afib on monitor, HR 80's, pedal pulses 2+ BL Pulm:  Normal effort on CPAP this AM from overnight, snoring.  Abd: Soft, non-tender, non-distended, +BS Skin: warm and dry, no rashes  Psych: A&Ox1  Lab Results:  Recent Labs    01/10/23 0656 01/11/23 0816  WBC 12.1* 8.5  HGB 10.9* 10.3*  HCT 32.0* 31.9*  PLT 117* 108*   BMET Recent Labs    01/10/23 0656 01/11/23 0816  NA 139 138  K 4.2 4.4  CL 106 104  CO2 27 25  GLUCOSE 116* 91  BUN 22 19  CREATININE 1.19 1.14  CALCIUM 9.5 9.9   PT/INR Recent Labs    01/09/23 1651  LABPROT 26.9*  INR 2.5*   CMP     Component Value Date/Time   NA 138 01/11/2023 0816   K 4.4 01/11/2023 0816   CL 104 01/11/2023 0816   CO2 25 01/11/2023 0816   GLUCOSE 91 01/11/2023 0816   BUN 19 01/11/2023 0816   CREATININE 1.14 01/11/2023 0816   CALCIUM 9.9 01/11/2023 0816   PROT 6.7 01/09/2023 1651   ALBUMIN 3.2 (L) 01/09/2023 1651   AST 22 01/09/2023 1651   ALT 10 01/09/2023 1651   ALKPHOS 118 01/09/2023 1651   BILITOT 1.0 01/09/2023 1651   GFRNONAA >60 01/11/2023 0816   Lipase  No results found for: "LIPASE"     Studies/Results: DG CHEST PORT 1 VIEW  Result Date: 01/10/2023 CLINICAL DATA:  Multiple left rib fractures. EXAM:  PORTABLE CHEST 1 VIEW COMPARISON:  Chest CT 01/09/2023 FINDINGS: Low volume film. The lungs are clear without focal pneumonia, edema, pneumothorax or pleural effusion. Cardiopericardial silhouette is at upper limits of normal for size. Telemetry leads overlie the chest. The multiple anterior left rib fracture seen on chest CT are not evident by x-ray. IMPRESSION: 1. Low volume film without acute cardiopulmonary findings. 2. Multiple anterior left rib fractures seen on chest CT are not evident by x-ray. No evidence for pneumothorax. Electronically Signed   By: Kennith Center M.D.   On: 01/10/2023 07:25   DG Pelvis Portable  Result Date: 01/09/2023 CLINICAL DATA:  Trauma, pain. EXAM: PORTABLE PELVIS 1-2 VIEWS COMPARISON:  Subsequent CT reformats, available at time of radiograph interpretation. FINDINGS: Left iliac crest is excluded from the field of view. No acute pelvic fracture. Intact right hip arthroplasty. Pubic rami are intact. Left hip osteoarthritis. No diastasis of pubic symphysis or sacroiliac joints. IMPRESSION: 1. No acute pelvic fracture. 2. Intact right hip arthroplasty. 3. Left hip osteoarthritis. Electronically Signed   By: Narda Rutherford M.D.   On: 01/09/2023 19:01   DG Chest Port 1 View  Result Date: 01/09/2023 CLINICAL DATA:  Trauma, pain. EXAM: PORTABLE CHEST 1 VIEW COMPARISON:  Subsequent chest  CT, available at time of radiograph interpretation. Chest radiograph 01/03/2023. FINDINGS: Stable heart size and mediastinal contours. Left chest wall loop recorder. No pneumothorax. Calcified mediastinal adenopathy. Left rib fractures on CT are not well demonstrated on the current exam. IMPRESSION: Stable cardiomegaly. Left rib fractures on subsequent CT are not well demonstrated on the current exam. Electronically Signed   By: Narda Rutherford M.D.   On: 01/09/2023 19:00   CT HEAD WO CONTRAST  Result Date: 01/09/2023 CLINICAL DATA:  Head trauma, moderate-severe; Polytrauma, blunt. EXAM: CT  HEAD WITHOUT CONTRAST CT CERVICAL SPINE WITHOUT CONTRAST TECHNIQUE: Multidetector CT imaging of the head and cervical spine was performed following the standard protocol without intravenous contrast. Multiplanar CT image reconstructions of the cervical spine were also generated. RADIATION DOSE REDUCTION: This exam was performed according to the departmental dose-optimization program which includes automated exposure control, adjustment of the mA and/or kV according to patient size and/or use of iterative reconstruction technique. COMPARISON:  CT head 01/04/2023.  CT cervical spine 12/19/2021. FINDINGS: CT HEAD FINDINGS Brain: There is no evidence of an acute infarct, intracranial hemorrhage, mass, midline shift, or extra-axial fluid collection. Chronic right MCA territory infarcts are again noted involving the frontal and parietal lobes. Hypodensities in the cerebral white matter bilaterally are unchanged and nonspecific but compatible with moderate chronic small vessel ischemic disease. A chronic infarct is also again noted in the right midbrain/cerebral peduncle. There is mild-to-moderate cerebral atrophy. Vascular: No hyperdense vessel. Skull: No acute fracture or suspicious osseous lesion. Sinuses/Orbits: Visualized paranasal sinuses and mastoid air cells are clear. Unremarkable orbits. Other: Parietal scalp contusion. CT CERVICAL SPINE FINDINGS Alignment: Chronic straightening of the normal cervical lordosis and grade 1 anterolisthesis of C4 on C5. Skull base and vertebrae: No acute fracture or suspicious osseous lesion. Asymmetrically severe right-sided C1-2 arthropathy. Soft tissues and spinal canal: No prevertebral fluid or swelling. No visible canal hematoma. Disc levels: Bulky anterior vertebral osteophytosis with interbody ankylosis from C2-T1. Facet ankylosis on the left at C2-3 and on the right at C3-4. Upper chest: Reported separately. Other: None. Preliminary findings were communicated in person to  Dr. Violeta Gelinas on 01/09/2023 at 4:17 p.m. IMPRESSION: 1. No evidence of acute intracranial abnormality or acute cervical spine fracture. 2. Chronic ischemia with multiple old infarcts. Electronically Signed   By: Sebastian Ache M.D.   On: 01/09/2023 18:02   CT CERVICAL SPINE WO CONTRAST  Result Date: 01/09/2023 CLINICAL DATA:  Head trauma, moderate-severe; Polytrauma, blunt. EXAM: CT HEAD WITHOUT CONTRAST CT CERVICAL SPINE WITHOUT CONTRAST TECHNIQUE: Multidetector CT imaging of the head and cervical spine was performed following the standard protocol without intravenous contrast. Multiplanar CT image reconstructions of the cervical spine were also generated. RADIATION DOSE REDUCTION: This exam was performed according to the departmental dose-optimization program which includes automated exposure control, adjustment of the mA and/or kV according to patient size and/or use of iterative reconstruction technique. COMPARISON:  CT head 01/04/2023.  CT cervical spine 12/19/2021. FINDINGS: CT HEAD FINDINGS Brain: There is no evidence of an acute infarct, intracranial hemorrhage, mass, midline shift, or extra-axial fluid collection. Chronic right MCA territory infarcts are again noted involving the frontal and parietal lobes. Hypodensities in the cerebral white matter bilaterally are unchanged and nonspecific but compatible with moderate chronic small vessel ischemic disease. A chronic infarct is also again noted in the right midbrain/cerebral peduncle. There is mild-to-moderate cerebral atrophy. Vascular: No hyperdense vessel. Skull: No acute fracture or suspicious osseous lesion. Sinuses/Orbits: Visualized paranasal sinuses and mastoid air  cells are clear. Unremarkable orbits. Other: Parietal scalp contusion. CT CERVICAL SPINE FINDINGS Alignment: Chronic straightening of the normal cervical lordosis and grade 1 anterolisthesis of C4 on C5. Skull base and vertebrae: No acute fracture or suspicious osseous lesion.  Asymmetrically severe right-sided C1-2 arthropathy. Soft tissues and spinal canal: No prevertebral fluid or swelling. No visible canal hematoma. Disc levels: Bulky anterior vertebral osteophytosis with interbody ankylosis from C2-T1. Facet ankylosis on the left at C2-3 and on the right at C3-4. Upper chest: Reported separately. Other: None. Preliminary findings were communicated in person to Dr. Violeta Gelinas on 01/09/2023 at 4:17 p.m. IMPRESSION: 1. No evidence of acute intracranial abnormality or acute cervical spine fracture. 2. Chronic ischemia with multiple old infarcts. Electronically Signed   By: Sebastian Ache M.D.   On: 01/09/2023 18:02   CT CHEST ABDOMEN PELVIS W CONTRAST  Result Date: 01/09/2023 CLINICAL DATA:  Unwitnessed fall. EXAM: CT CHEST, ABDOMEN, AND PELVIS WITH CONTRAST TECHNIQUE: Multidetector CT imaging of the chest, abdomen and pelvis was performed following the standard protocol during bolus administration of intravenous contrast. RADIATION DOSE REDUCTION: This exam was performed according to the departmental dose-optimization program which includes automated exposure control, adjustment of the mA and/or kV according to patient size and/or use of iterative reconstruction technique. CONTRAST:  75mL OMNIPAQUE IOHEXOL 350 MG/ML SOLN, <See Chart> OMNIPAQUE IOHEXOL 350 MG/ML SOLN COMPARISON:  None Available. FINDINGS: CT CHEST FINDINGS Cardiovascular: Mild cardiac enlargement aortic atherosclerosis. Three vessel coronary artery calcifications. No pericardial effusion. Mediastinum/Nodes: Calcified mediastinal and hilar lymph nodes. Thyroid gland, trachea and esophagus appear normal. Lungs/Pleura: No pleural effusion. Subsegmental atelectasis noted in the posterior lung bases, left greater than right. Non solid nodule in the superior segment of the left lower lobe measures 1.6 cm, image 75/8. Calcified nodules within the lingula, image 88/8. Within the superior segment of the left lower lobe there  is a 9 mm ground-glass nodule, image 62/8. No pneumothorax identified. Musculoskeletal: The fractures are identified involving the lateral and aspect of the left tenth, ninth and eighth ribs. CT ABDOMEN PELVIS FINDINGS Hepatobiliary: No hepatic injury or perihepatic hematoma. Gallbladder is unremarkable. Pancreas: Unremarkable. No pancreatic ductal dilatation or surrounding inflammatory changes. Spleen: No splenic injury or perisplenic hematoma. Calcified granulomas identified. Adrenals/Urinary Tract: Normal appearance of the adrenal glands. Simple appearing exophytic cyst arises off the anterolateral cortex of the left kidney measuring 3.5 cm, image 69/7. There is a small amount of fluid adjacent to the lower pole of the right kidney, image 78/2. No signs of renal laceration or contusion. Asymmetric left renal cortical scarring and volume loss. Mild bladder wall thickening with surrounding haziness. Stomach/Bowel: The stomach appears within normal limits. There is no pathologic dilatation of the large or small bowel loops. Bowel anatomy in the right lower quadrant is difficult to evaluate reflecting lack of enteric contrast material. There is a suspected filling defect within the either the cecum or distal ileum which appears hyperdense and may represent an underlying neoplasm. Vascular/Lymphatic: Aortic atherosclerosis. No aneurysm. The abdominal vascularity appears patent. In the right lower quadrant of the abdomen adjacent to the ileum and cecum there is a soft tissue mass containing internal calcifications measuring 3.3 x 1.9 by 2.7 cm. No retroperitoneal or pelvic adenopathy. Reproductive: Prostate gland enlargement. Other: No significant free fluid or fluid collections. No signs of pneumoperitoneum. Musculoskeletal: Previous right hip arthroplasty. Bones appear diffusely osteopenic. Multilevel lumbar degenerative disc disease. Lumbar vertebral body heights are well preserved without signs of fracture.  IMPRESSION: 1. Acute  fractures involving the lateral aspects of the left tenth, ninth and eighth ribs. No signs of pneumothorax. 2. Subtle, right anterior superior endplate deformity involving the T3 vertebra compatible with an age-indeterminate fracture. 3. Small amount of low-attenuation fluid adjacent to the lower pole of the right kidney. No signs of renal laceration or contusion. 4. In the right lower quadrant of the abdomen, adjacent to the ileum and cecum there is a soft tissue mass containing internal calcifications measuring 3.3 x 1.9 x 2.7 cm. Cannot rule out underlying carcinoid tumor. 5. Bowel anatomy in the right lower quadrant is difficult to evaluate reflecting lack of enteric contrast material. There is a suspected filling defect within the either the cecum or distal ileum which appears hyperdense and may represent an underlying neoplasm. Recommend follow-up imaging with CT of the abdomen pelvis with oral and IV contrast material as well as visualization with colonoscopy. 6. Two non solid nodules in the left lower lobe are noted measuring up to 1.6 cm. Non-contrast chest CT at 3-6 months is recommended. If nodules persist, subsequent management will be based upon the most suspicious nodule(s). This recommendation follows the consensus statement: Guidelines for Management of Incidental Pulmonary Nodules Detected on CT Images: From the Fleischner Society 2017; Radiology 2017; 284:228-243. 7. Mild bladder wall thickening with surrounding 2 haziness. Correlate for any signs/symptoms of cystitis. 8. Coronary artery calcifications. 9. Prostate gland enlargement. 10.  Aortic Atherosclerosis (ICD10-I70.0). These results were called by telephone at the time of interpretation on 01/09/2023 at 4:48 pm to provider Violeta Gelinas, MD, who acknowledged these results. Electronically Signed   By: Signa Kell M.D.   On: 01/09/2023 16:49    Anti-infectives: Anti-infectives (From admission, onward)    None         Assessment/Plan  Unwitnessed Fall Possible concussion - TBI therapies L 8-10 rib fxs - IS, pulm toilet, pain control, muscle relaxers prn OSA - CPAP at night Age indeterminate T3 vertebral endplate deformity - no intervention Small fluid around R kidney - no evidence of injury Soft tissue mass in RLQ, can't rule out carcinoid - outpatient follow up ? Mass in TI/cecum - outpatient work up 2 non solid nodules in LLL - follow up CT outpatient in 3-6 months CAD A fib - on Xarelto at home, on hold for now; hgb stablizing 10.3 from 10.9. plts 108 HTN HLD Anemia Bipolar disorder Dysphagia - SLP to see, wife states he is on pureed diet at the facility, pills with applesauce  FEN - npo per SLP, IVFs at 75 mL/hr ?may need a cortrak pending progress with SLP VTE - Lovenox, Xarelto on hold ID - none needed Admit - observation, med-surg, therapies.    LOS: 2 days   I reviewed nursing notes, ED provider notes, last 24 h vitals and pain scores, last 48 h intake and output, last 24 h labs and trends, and last 24 h imaging results.  This care required moderate level of medical decision making.   Hosie Spangle, PA-C [facility] Please see Amion for pager number during day hours 7:00am-4:30pm

## 2023-01-11 NOTE — NC FL2 (Signed)
[place] MEDICAID FL2 LEVEL OF CARE FORM     IDENTIFICATION  Patient Name: [name] [name] Birthdate: 1945/09/10 Sex: male Admission Date (Current Location): 01/09/2023  [facility] [place] Number:  Producer, television/film/video and Address:  [facility], [address], [place], [place] [address]      Provider Number: [id number]  Attending Physician Name and Address:  Md, Trauma, MD  Relative Name and Phone Number:       Current Level of Care: Hospital Recommended Level of Care: Skilled Nursing Facility Prior Approval Number:    Date Approved/Denied:   PASRR Number:    Discharge Plan: SNF    Current Diagnoses: Patient Active Problem List   Diagnosis Date Noted   Concussion 01/09/2023   OSA (obstructive sleep apnea) 01/09/2023   CAD (coronary artery disease) of artery bypass graft 01/09/2023   Dementia with behavioral disturbance (HCC) 01/09/2023   Atrial fibrillation (HCC) 01/09/2023   Chronic anticoagulation 01/09/2023   HTN (hypertension) 01/09/2023   Bipolar disorder (HCC) 01/09/2023    Orientation RESPIRATION BLADDER Height & Weight     Self   (CPAP) Incontinent Weight: 155 lb (70.3 kg) Height:  5\' 11"  (180.3 cm)  BEHAVIORAL SYMPTOMS/MOOD NEUROLOGICAL BOWEL NUTRITION STATUS      Incontinent  (to be determined)  AMBULATORY STATUS COMMUNICATION OF NEEDS Skin   Extensive Assist Verbally Other (Comment) (open wound, incontinence associated dermatitis sacrum medial mid buttocks)                       Personal Care Assistance Level of Assistance  Feeding, Bathing, Dressing Bathing Assistance: Limited assistance Feeding assistance: Limited assistance Dressing Assistance: Limited assistance     Functional Limitations Info  Sight, Hearing, Speech Sight Info: Adequate Hearing Info: Adequate Speech Info: Adequate    SPECIAL CARE FACTORS FREQUENCY  PT (By licensed PT), OT (By licensed OT)     PT Frequency: 3x OT Frequency: 3x             Contractures Contractures Info: Not present    Additional Factors Info  Code Status, Allergies Code Status Info: full code Allergies Info: latex           Current Medications (01/11/2023):  This is the current hospital active medication list Current Facility-Administered Medications  Medication Dose Route Frequency Provider Last Rate Last Admin   acetaminophen (TYLENOL) tablet 1,000 mg  1,000 mg Oral Q6H Violeta Gelinas, MD   1,000 mg at 01/11/23 0539   dextrose 5 %-0.9 % sodium chloride infusion   Intravenous Continuous Simaan, Elizabeth S, PA-C       divalproex (DEPAKOTE) DR tablet 250 mg  250 mg Oral Q breakfast Violeta Gelinas, MD       divalproex (DEPAKOTE) DR tablet 500 mg  500 mg Oral QHS Violeta Gelinas, MD   500 mg at 01/10/23 2049   docusate sodium (COLACE) capsule 100 mg  100 mg Oral BID Violeta Gelinas, MD   100 mg at 01/10/23 2050   enoxaparin (LOVENOX) injection 30 mg  30 mg Subcutaneous Q12H Violeta Gelinas, MD   30 mg at 01/10/23 2049   haloperidol lactate (HALDOL) injection 5 mg  5 mg Intravenous Q6H PRN Moise Boring, MD   5 mg at 01/10/23 2231   hydrALAZINE (APRESOLINE) injection 10 mg  10 mg Intravenous Q2H PRN Violeta Gelinas, MD       iohexol (OMNIPAQUE) 350 MG/ML injection 75 mL  75 mL Intravenous Once  PRN Violeta Gelinas, MD       lamoTRIgine (LAMICTAL) tablet 150 mg  150 mg Oral BID Violeta Gelinas, MD   150 mg at 01/10/23 2050   methocarbamol (ROBAXIN) injection 500 mg  500 mg Intravenous Q8H Violeta Gelinas, MD   500 mg at 01/11/23 0539   metoprolol tartrate (LOPRESSOR) injection 5 mg  5 mg Intravenous Q6H PRN Violeta Gelinas, MD       morphine (PF) 2 MG/ML injection 2 mg  2 mg Intravenous Q2H PRN Violeta Gelinas, MD       morphine (PF) 4 MG/ML injection 4 mg  4 mg Intravenous Q2H PRN Violeta Gelinas, MD       ondansetron (ZOFRAN-ODT) disintegrating tablet 4 mg  4 mg Oral Q6H PRN Violeta Gelinas, MD       Or   ondansetron [facility]) injection 4  mg  4 mg Intravenous Q6H PRN Violeta Gelinas, MD       pantoprazole (PROTONIX) EC tablet 40 mg  40 mg Oral Daily Violeta Gelinas, MD       Or   pantoprazole (PROTONIX) injection 40 mg  40 mg Intravenous Daily Violeta Gelinas, MD   40 mg at 01/10/23 0903   polyethylene glycol (MIRALAX / GLYCOLAX) packet 17 g  17 g Oral Daily PRN Violeta Gelinas, MD         Discharge Medications: Please see discharge summary for a list of discharge medications.  Relevant Imaging Results:  Relevant Lab Results:   Additional Information    Dalyn Kjos A Lamarco Gudiel, LCSW

## 2023-01-11 NOTE — Progress Notes (Signed)
Speech Language Pathology Treatment: Dysphagia  Patient Details Name: [name] [name] MRN: [id number] DOB: 23-May-1945 Today's Date: 01/11/2023 Time: 8657-8469 SLP Time Calculation (min) (ACUTE ONLY): 13 min  Assessment / Plan / Recommendation Clinical Impression  F/u after yesterday's assessment. [name] [name] was enthusiastic and interactive. His family was at the bedside. He accepted sips of water from a straw and ate most of a container of pudding with oral holding and "swishing" of purees within in his mouth until he would eventually swallow. His daughter stated that he has been eating this way for a while.  There were no concerns for aspiration.  Recommend resuming his baseline diet of pureed/dysphagia 1 and thin liquids. Crush meds for now. SLP will follow for potential for diet advancement and speech/cognitive assessment. He will need assistance with feeding. D/W nurse.   HPI HPI: This is a 77 yo white male who currently resides at [facility] has a history of Alzheimer's dementia, CAD, a fib on Xarelto, HTN, HLD, OSA on CPAP, bipolar disorder, and anemia who recently travelled to [facility] and had a fall and was admitted and found to have no injuries and generalized weakness.  He was discharged but then admitted to [facility] two days later secondary to another fall.  He was then discharged to [facility]/SNF.  He apparently has been having behavioral issues related to his dementia and the recommendation has been for DC from SNF to a memory care unit.  On 01/09/23 around 9am he was found on the floor after and unwitnessed fall.  He was neurologically at his baseline at that time.  Throughout the day he has become more lethargic.  He was brought into the ED as a level 1 trauma.  He arrives with a GCS of 9.  As he has been here longer this has improved and he is now spontaneously opening his eyes and now intelligibly speaking at times.  He is still fairly lethargic otherwise though.  Trauma work up has revealed L  8-10 rib fxs, and concussion.  ST consulted for swallow evaluation.  Pt currently NPO.      SLP Plan  Continue with current plan of care      Recommendations for follow up therapy are one component of a multi-disciplinary discharge planning process, led by the attending physician.  Recommendations may be updated based on patient status, additional functional criteria and insurance authorization.    Recommendations  Diet recommendations: Dysphagia 1 (puree);Thin liquid Liquids provided via: Cup;Straw Medication Administration: Crushed with puree Supervision: Staff to assist with self feeding Compensations: Minimize environmental distractions Postural Changes and/or Swallow Maneuvers: Seated upright 90 degrees                  Oral care BID   Frequent or constant Supervision/Assistance Dysphagia, oropharyngeal phase (R13.12)     Continue with current plan of care   Donovyn Guidice L. Samson Frederic, MA CCC/SLP Clinical Specialist - Acute Care SLP Acute Rehabilitation Services Office number [phone]   [name]  01/11/2023, 4:19 PM

## 2023-01-11 NOTE — TOC Initial Note (Signed)
Transition of Care [facility]) - Initial/Assessment Note    Patient Details  Name: [name] [name] MRN: [id number] Date of Birth: 04-07-1945  Transition of Care [facility]) CM/SW Contact:    Maree Krabbe, LCSW Phone Number: 01/11/2023, 9:36 AM  Clinical Narrative:    SW spoke with pt's spouse. Pt's spouse confirmed the plan is for pt to return to [facility] at discharge. Pt is STR there.               Expected Discharge Plan: Skilled Nursing Facility Barriers to Discharge: Continued Medical Work up   Patient Goals and CMS Choice            Expected Discharge Plan and Services In-house Referral: Clinical Social Work   Post Acute Care Choice: Skilled Nursing Facility Living arrangements for the past 2 months: Skilled Nursing Facility                                      Prior Living Arrangements/Services Living arrangements for the past 2 months: Skilled Nursing Facility Lives with:: Facility Resident Patient language and need for interpreter reviewed:: Yes Do you feel safe going back to the place where you live?: Yes      Need for Family Participation in Patient Care: Yes (Comment) Care giver support system in place?: Yes (comment)   Criminal Activity/Legal Involvement Pertinent to Current Situation/Hospitalization: No - Comment as needed  Activities of Daily Living      Permission Sought/Granted Permission sought to share information with : Family Supports Permission granted to share information with : Yes, Verbal Permission Granted              Emotional Assessment Appearance:: Appears stated age Attitude/Demeanor/Rapport: Unable to Assess Affect (typically observed): Unable to Assess Orientation: : Oriented to Self Alcohol / Substance Use: Not Applicable Psych Involvement: No (comment)  Admission diagnosis:  Concussion [S06.0XAA] Fall, initial encounter [id number]] Closed fracture of multiple ribs, unspecified laterality, initial encounter  [S22.49XA] Patient Active Problem List   Diagnosis Date Noted   Concussion 01/09/2023   OSA (obstructive sleep apnea) 01/09/2023   CAD (coronary artery disease) of artery bypass graft 01/09/2023   Dementia with behavioral disturbance (HCC) 01/09/2023   Atrial fibrillation (HCC) 01/09/2023   Chronic anticoagulation 01/09/2023   HTN (hypertension) 01/09/2023   Bipolar disorder (HCC) 01/09/2023   PCP:  Merri Brunette, MD Pharmacy:  No Pharmacies Listed    Social Determinants of Health (SDOH) Social History:   SDOH Interventions:     Readmission Risk Interventions     No data to display

## 2023-01-11 NOTE — Plan of Care (Signed)
  Problem: Clinical Measurements: Goal: Will remain free from infection Outcome: Progressing   Problem: Clinical Measurements: Goal: Diagnostic test results will improve Outcome: Progressing   Problem: Clinical Measurements: Goal: Respiratory complications will improve Outcome: Progressing   Problem: Education: Goal: Knowledge of General Education information will improve Description: Including pain rating scale, medication(s)/side effects and non-pharmacologic comfort measures Outcome: Progressing   Problem: Elimination: Goal: Will not experience complications related to bowel motility Outcome: Progressing Goal: Will not experience complications related to urinary retention Outcome: Progressing   Problem: Coping: Goal: Level of anxiety will decrease Outcome: Progressing

## 2023-01-11 NOTE — Progress Notes (Signed)
SLP Cancellation Note  Patient Details Name: [name] [name] MRN: [id number] DOB: 1945-11-04   Cancelled treatment:       Reason Eval/Treat Not Completed: Other (comment). Pt about to get up to walk, will come back later   [facility], Riley Nearing 01/11/2023, 12:19 PM

## 2023-01-11 NOTE — Plan of Care (Signed)
POC progressing.  

## 2023-01-12 ENCOUNTER — Ambulatory Visit: Payer: Medicare PPO

## 2023-01-12 LAB — GLUCOSE, CAPILLARY
Glucose-Capillary: 103 mg/dL — ABNORMAL HIGH (ref 70–99)
Glucose-Capillary: 113 mg/dL — ABNORMAL HIGH (ref 70–99)
Glucose-Capillary: 132 mg/dL — ABNORMAL HIGH (ref 70–99)
Glucose-Capillary: 79 mg/dL (ref 70–99)
Glucose-Capillary: 94 mg/dL (ref 70–99)

## 2023-01-12 MED ORDER — DIVALPROEX SODIUM 125 MG PO CSDR
500.0000 mg | DELAYED_RELEASE_CAPSULE | Freq: Every day | ORAL | Status: DC
  Administered 2023-01-12 – 2023-01-22 (×9): 500 mg via ORAL
  Filled 2023-01-12 (×12): qty 4

## 2023-01-12 MED ORDER — DIVALPROEX SODIUM 125 MG PO CSDR
250.0000 mg | DELAYED_RELEASE_CAPSULE | Freq: Every day | ORAL | Status: DC
  Administered 2023-01-13 – 2023-01-23 (×11): 250 mg via ORAL
  Filled 2023-01-12 (×11): qty 2

## 2023-01-12 MED ORDER — LISINOPRIL 10 MG PO TABS
10.0000 mg | ORAL_TABLET | Freq: Every evening | ORAL | Status: DC
  Administered 2023-01-12 – 2023-01-22 (×8): 10 mg via ORAL
  Filled 2023-01-12 (×9): qty 1

## 2023-01-12 MED ORDER — FOLIC ACID 1 MG PO TABS
1.0000 mg | ORAL_TABLET | Freq: Every day | ORAL | Status: DC
  Administered 2023-01-12 – 2023-01-22 (×9): 1 mg via ORAL
  Filled 2023-01-12 (×9): qty 1

## 2023-01-12 MED ORDER — MEMANTINE HCL 10 MG PO TABS
10.0000 mg | ORAL_TABLET | Freq: Two times a day (BID) | ORAL | Status: DC
  Administered 2023-01-12 – 2023-01-23 (×21): 10 mg via ORAL
  Filled 2023-01-12 (×21): qty 1

## 2023-01-12 MED ORDER — CARBIDOPA-LEVODOPA 25-250 MG PO TABS
1.0000 | ORAL_TABLET | Freq: Two times a day (BID) | ORAL | Status: DC
  Administered 2023-01-12 – 2023-01-23 (×21): 1 via ORAL
  Filled 2023-01-12 (×23): qty 1

## 2023-01-12 MED ORDER — DONEPEZIL HCL 5 MG PO TABS
10.0000 mg | ORAL_TABLET | Freq: Every day | ORAL | Status: DC
  Administered 2023-01-12 – 2023-01-22 (×9): 10 mg via ORAL
  Filled 2023-01-12 (×9): qty 2

## 2023-01-12 NOTE — Evaluation (Signed)
Speech Language Pathology Evaluation Patient Details Name: [name] [name] MRN: [id number] DOB: 1945-06-08 Today's Date: 01/12/2023 Time: 6295-2841 SLP Time Calculation (min) (ACUTE ONLY): 9 min  Problem List:  Patient Active Problem List   Diagnosis Date Noted   Concussion 01/09/2023   OSA (obstructive sleep apnea) 01/09/2023   CAD (coronary artery disease) of artery bypass graft 01/09/2023   Dementia with behavioral disturbance (HCC) 01/09/2023   Atrial fibrillation (HCC) 01/09/2023   Chronic anticoagulation 01/09/2023   HTN (hypertension) 01/09/2023   Bipolar disorder (HCC) 01/09/2023   Past Medical History: History reviewed. No pertinent past medical history. Past Surgical History: History reviewed. No pertinent surgical history. HPI:  This is a 77 yo white male who currently resides at [facility] has a history of Alzheimer's dementia, CAD, a fib on Xarelto, HTN, HLD, OSA on CPAP, bipolar disorder, and anemia who recently travelled to [facility] and had a fall and was admitted and found to have no injuries and generalized weakness.  He was discharged but then admitted to [facility] two days later secondary to another fall.  He was then discharged to [facility].  He apparently has been having behavioral issues related to his dementia and the recommendation has been for DC from SNF to a memory care unit.  On 01/09/23 around 9am he was found on the floor after and unwitnessed fall.  He was neurologically at his baseline at that time.  Throughout the day he has become more lethargic.  He was brought into the ED as a level 1 trauma.  He arrives with a GCS of 9.  As he has been here longer this has improved and he is now spontaneously opening his eyes and now intelligibly speaking at times.  He is still fairly lethargic otherwise though.  Trauma work up has revealed L 8-10 rib fxs, and concussion.  ST consulted for swallow evaluation.  Pt currently NPO.   Assessment / Plan / Recommendation Clinical  Impression  Pt presents with cognitive deficits although with known h/o dementia. He is oriented to person only, but this seems to be consistent with his hx given review of chart. His most recent MMSE score earlier this year was 2/30 and there was consideration of memory care PTA. At present he has reduced awareness, attention, memory and command following, but he is conversant and participatory in simple, functional tasks. Suspect that this may be near his baseline, but will need to confirm this with family. Either way would recommend 24/7 supervision upon discharge.    SLP Assessment  SLP Recommendation/Assessment: Patient needs continued Speech Lanaguage Pathology Services SLP Visit Diagnosis: Cognitive communication deficit (R41.841)    Recommendations for follow up therapy are one component of a multi-disciplinary discharge planning process, led by the attending physician.  Recommendations may be updated based on patient status, additional functional criteria and insurance authorization.    Follow Up Recommendations  Skilled nursing-short term rehab (<3 hours/day)    Assistance Recommended at Discharge  Frequent or constant Supervision/Assistance  Functional Status Assessment Patient has had a recent decline in their functional status and/or demonstrates limited ability to make significant improvements in function in a reasonable and predictable amount of time  Frequency and Duration min 1 x/week  1 week      SLP Evaluation Cognition  Overall Cognitive Status: History of cognitive impairments - at baseline Arousal/Alertness: Awake/alert Orientation Level: Oriented to person;Disoriented to place;Disoriented to situation Attention: Sustained Sustained Attention: Impaired Sustained Attention Impairment: Verbal basic;Functional basic Memory: Impaired Memory  Impairment: Decreased recall of new information Awareness: Impaired Awareness Impairment: Emergent impairment Safety/Judgment:  Impaired       Comprehension  Auditory Comprehension Overall Auditory Comprehension: Impaired Commands: Impaired One Step Basic Commands: 50-74% accurate    Expression Expression Primary Mode of Expression: Verbal Verbal Expression Overall Verbal Expression: Impaired Initiation: No impairment Automatic Speech: Name;Social Response Level of Generative/Spontaneous Verbalization: Sentence Pragmatics: Impairment Impairments: Topic maintenance;Eye contact Non-Verbal Means of Communication: Not applicable   Oral / Motor  Motor Speech Overall Motor Speech: Appears within functional limits for tasks assessed            Mahala Menghini., M.A. CCC-SLP Acute Rehabilitation Services Office [phone]  Secure chat preferred  01/12/2023, 11:33 AM

## 2023-01-12 NOTE — Progress Notes (Addendum)
[facility] Progress Note     Subjective: CC:  NAEO. Oriented to self. More alert this morning. Denies pain.    AFVSS   Objective: Vital signs in last 24 hours: Temp:  [97.5 F (36.4 C)-98.3 F (36.8 C)] 98.3 F (36.8 C) (11/11 0753) Pulse Rate:  [69-91] 91 (11/11 0753) Resp:  [17-21] 20 (11/11 0753) BP: (112-179)/(52-93) 179/80 (11/11 0753) SpO2:  [96 %-100 %] 96 % (11/11 0753) Last BM Date :  (prior to admission)  Intake/Output from previous day: 11/10 0701 - 11/11 0700 In: 1546.5 [I.V.:1546.5] Out: 800 [Urine:800] Intake/Output this shift: No intake/output data recorded.  PE: Gen:  alert, oriented to self Card: irregular - afib on monitor, HR 80's, pedal pulses 2+ BL Pulm:  Normal effort on room air Abd: Soft, non-tender, non-distended, +BS Skin: warm and dry, no rashes  Psych: A&Ox1  Lab Results:  Recent Labs    01/10/23 0656 01/11/23 0816  WBC 12.1* 8.5  HGB 10.9* 10.3*  HCT 32.0* 31.9*  PLT 117* 108*   BMET Recent Labs    01/10/23 0656 01/11/23 0816  NA 139 138  K 4.2 4.4  CL 106 104  CO2 27 25  GLUCOSE 116* 91  BUN 22 19  CREATININE 1.19 1.14  CALCIUM 9.5 9.9   PT/INR Recent Labs    01/09/23 1651  LABPROT 26.9*  INR 2.5*   CMP     Component Value Date/Time   NA 138 01/11/2023 0816   K 4.4 01/11/2023 0816   CL 104 01/11/2023 0816   CO2 25 01/11/2023 0816   GLUCOSE 91 01/11/2023 0816   BUN 19 01/11/2023 0816   CREATININE 1.14 01/11/2023 0816   CALCIUM 9.9 01/11/2023 0816   PROT 6.7 01/09/2023 1651   ALBUMIN 3.2 (L) 01/09/2023 1651   AST 22 01/09/2023 1651   ALT 10 01/09/2023 1651   ALKPHOS 118 01/09/2023 1651   BILITOT 1.0 01/09/2023 1651   GFRNONAA >60 01/11/2023 0816   Lipase  No results found for: "LIPASE"     Studies/Results: No results found.  Anti-infectives: Anti-infectives (From admission, onward)    None        Assessment/Plan  Unwitnessed Fall Possible concussion - TBI therapies L  8-10 rib fxs - IS, pulm toilet, pain control, muscle relaxers prn OSA - CPAP at night Age indeterminate T3 vertebral endplate deformity - no intervention Small fluid around R kidney - no evidence of injury Soft tissue mass in RLQ, can't rule out carcinoid - outpatient follow up ? Mass in TI/cecum - outpatient work up 2 non solid nodules in LLL - follow up CT outpatient in 3-6 months Dementia - namenda and aricept ordered, he is on quite a few meds at home for dementia/bipolar - simemet,remeron, gabapentin, lamictal, haldol. All sedating medications. Will review with pharmacy and MD.  CAD A fib - on Xarelto at home, on hold for now; hgb stablizing 10.3 from 10.9. plts 108 HTN - home meds re-ordered today HLD Anemia Bipolar disorder Dysphagia - SLP cleared for DYS 1  FEN - DYS 1 VTE - Lovenox, Xarelto on hold ID - none needed Admit - observation, med-surg, therapies.  Will call his family today, I think his is near his baseline    LOS: 3 days   I reviewed nursing notes, ED provider notes, last 24 h vitals and pain scores, last 48 h intake and output, last 24 h labs and trends, and last 24 h imaging results.  This care required  moderate level of medical decision making.   Hosie Spangle, PA-C [facility] Please see Amion for pager number during day hours 7:00am-4:30pm

## 2023-01-12 NOTE — Plan of Care (Signed)
POC progressing.  

## 2023-01-12 NOTE — Progress Notes (Signed)
Speech Language Pathology Treatment: Dysphagia  Patient Details Name: [name] [name] MRN: [id number] DOB: Dec 05, 1945 Today's Date: 01/12/2023 Time: 0865-7846 SLP Time Calculation (min) (ACUTE ONLY): 8 min  Assessment / Plan / Recommendation Clinical Impression  Pt had some difficulty initiating oral intake, related to cognitive status and generally decreased awareness that was further impacted by not opening his eyes to see the task at hand. He did get a small amount of water via spoon, and once he had this as a "warm up" he was able to accept additional thin liquid boluses via straw and purees with intermittent oral holding, as is his baseline. He does not have overt s/s of aspiration. More solid textures were deferred as he is normally on a pureed diet per review of chart. Will leave current diet order in place, still with need for assistance during meals given cognition.    HPI HPI: This is a 77 yo white male who currently resides at [facility] has a history of Alzheimer's dementia, CAD, a fib on Xarelto, HTN, HLD, OSA on CPAP, bipolar disorder, and anemia who recently travelled to [facility] and had a fall and was admitted and found to have no injuries and generalized weakness.  He was discharged but then admitted to [facility] two days later secondary to another fall.  He was then discharged to [facility].  He apparently has been having behavioral issues related to his dementia and the recommendation has been for DC from SNF to a memory care unit.  On 01/09/23 around 9am he was found on the floor after and unwitnessed fall.  He was neurologically at his baseline at that time.  Throughout the day he has become more lethargic.  He was brought into the ED as a level 1 trauma.  He arrives with a GCS of 9.  As he has been here longer this has improved and he is now spontaneously opening his eyes and now intelligibly speaking at times.  He is still fairly lethargic otherwise though.  Trauma work up has revealed L  8-10 rib fxs, and concussion.  ST consulted for swallow evaluation.  Pt currently NPO.      SLP Plan  Continue with current plan of care      Recommendations for follow up therapy are one component of a multi-disciplinary discharge planning process, led by the attending physician.  Recommendations may be updated based on patient status, additional functional criteria and insurance authorization.    Recommendations  Diet recommendations: Dysphagia 1 (puree);Thin liquid Liquids provided via: Cup;Straw Medication Administration: Crushed with puree Supervision: Staff to assist with self feeding Compensations: Minimize environmental distractions Postural Changes and/or Swallow Maneuvers: Seated upright 90 degrees                  Oral care BID   Frequent or constant Supervision/Assistance Dysphagia, oropharyngeal phase (R13.12)     Continue with current plan of care     Mahala Menghini., M.A. CCC-SLP Acute Rehabilitation Services Office [phone]  Secure chat preferred   01/12/2023, 11:19 AM

## 2023-01-13 LAB — BASIC METABOLIC PANEL
Anion gap: 7 (ref 5–15)
BUN: 19 mg/dL (ref 8–23)
CO2: 27 mmol/L (ref 22–32)
Calcium: 9.5 mg/dL (ref 8.9–10.3)
Chloride: 105 mmol/L (ref 98–111)
Creatinine, Ser: 0.97 mg/dL (ref 0.61–1.24)
GFR, Estimated: >60 mL/min (ref >60)
Glucose, Bld: 97 mg/dL (ref 70–99)
Potassium: 3.9 mmol/L (ref 3.5–5.1)
Sodium: 139 mmol/L (ref 135–145)

## 2023-01-13 LAB — CBC
HCT: 30.1 % — ABNORMAL LOW (ref 39.0–52.0)
Hemoglobin: 9.8 g/dL — ABNORMAL LOW (ref 13.0–17.0)
MCH: 30.8 pg (ref 26.0–34.0)
MCHC: 32.6 g/dL (ref 30.0–36.0)
MCV: 94.7 fL (ref 80.0–100.0)
Platelets: 113 10*3/uL — ABNORMAL LOW (ref 150–400)
RBC: 3.18 MIL/uL — ABNORMAL LOW (ref 4.22–5.81)
RDW: 12.9 % (ref 11.5–15.5)
WBC: 4.2 10*3/uL (ref 4.0–10.5)
nRBC: 0 % (ref 0.0–0.2)

## 2023-01-13 LAB — GLUCOSE, CAPILLARY
Glucose-Capillary: 97 mg/dL (ref 70–99)
Glucose-Capillary: 85 mg/dL (ref 70–99)
Glucose-Capillary: 104 mg/dL — ABNORMAL HIGH (ref 70–99)

## 2023-01-13 MED ORDER — MIRTAZAPINE 15 MG PO TABS: 15.0000 mg | ORAL_TABLET | Freq: Every day | ORAL | Status: DC

## 2023-01-13 NOTE — Progress Notes (Signed)
Spoke to Dr Fredricka Bonine regarding patient choking on lunch and failing nurse bedside swallow checklist. Unable to reach SLP this afternoon. Stated to hold PO meds for tonight and keep patient NPO until SLP can see him in the AM.

## 2023-01-13 NOTE — Progress Notes (Signed)
Physical Therapy Treatment Patient Details Name: [name] [name] MRN: [id number] DOB: 04-21-1945 Today's Date: 01/13/2023   History of Present Illness Pt is a 77 yo male admitted 11/8 for unwitnessed fall from [facility], CT scan 11/8 no signs of cervical fx, acute fx L ribs 8-10. PMH: Alzheimer's dementia, CAD, Afib on Xarelto, HTN, HLD, OSA on CPAP, bipolar disorder, and anemia    PT Comments  Pt active and excitable during treatment session. Pt initially difficult to arouse but once seated EOB became more alert than previous interactions and would repeat instruction then attempt tasks. Pt still only oriented to self. Increased ambulation distance while only requiring 1 hand held assist to steady. Pt was left in recliner with belted chair alarm. Will continue to follow acutely.      If plan is discharge home, recommend the following: Two people to help with walking and/or transfers;Two people to help with bathing/dressing/bathroom;Assistance with cooking/housework;Assist for transportation;Help with stairs or ramp for entrance;Supervision due to cognitive status   Can travel by private vehicle     Yes  Equipment Recommendations  None recommended by PT    Recommendations for Other Services       Precautions / Restrictions Precautions Precautions: Fall Restrictions Weight Bearing Restrictions: No     Mobility  Bed Mobility Overal bed mobility: Needs Assistance Bed Mobility: Rolling, Sidelying to Sit Rolling: Used rails, Min assist   Supine to sit: HOB elevated, Min assist     General bed mobility comments: pt required minA to facillitate leg and trunk movement to EOB but would complete with guidance    Transfers Overall transfer level: Needs assistance Equipment used: 1 person hand held assist Transfers: Sit to/from Stand Sit to Stand: Min assist           General transfer comment: Pt rose from EOB by pulling on therapist hand and pushing on bedside, minA  to steady and facilitate    Ambulation/Gait Ambulation/Gait assistance: Min assist Gait Distance (Feet): 250 Feet Assistive device: 1 person hand held assist Gait Pattern/deviations: Decreased step length - left, Decreased step length - right, Decreased stride length, Knee flexed in stance - left, Knee flexed in stance - right, Shuffle, Drifts right/left, Narrow base of support   Gait velocity interpretation: <1.8 ft/sec, indicate of risk for recurrent falls   General Gait Details: Pt ambulated with 1 handheld assist through session. maintained previous gait deviations while ambulating further with less assistance   Stairs             Wheelchair Mobility     Tilt Bed    Modified Rankin (Stroke Patients Only)       Balance Overall balance assessment: Needs assistance Sitting-balance support: Feet unsupported, Feet supported, No upper extremity supported, Bilateral upper extremity supported Sitting balance-Leahy Scale: Fair     Standing balance support: During functional activity, Single extremity supported, Reliant on assistive device for balance Standing balance-Leahy Scale: Poor Standing balance comment: extremely narrow base of support, requires assistance to stay stable in standing                            Cognition Arousal: Lethargic Behavior During Therapy: Flat affect Overall Cognitive Status: History of cognitive impairments - at baseline                                 General Comments: Pt  started session difficult to arouse, became very active and excitable through mist of sesson, maintained eyes opened, repeated commands back before attempting tasks, and sang to self during ambulation, once returned to recliner became lethargic and difficult to arouse again        Exercises General Exercises - Lower Extremity Long Arc Quad: AROM, Both, 10 reps (constant cueing to start, pt does not count and will continue to do past required  reps) Hip Flexion/Marching: AROM, Both, 10 reps (constant cueing to start, pt does not count and will continue to do past required reps)    General Comments        Pertinent Vitals/Pain Pain Assessment Pain Assessment: No/denies pain    Home Living                          Prior Function            PT Goals (current goals can now be found in the care plan section) Progress towards PT goals: Progressing toward goals    Frequency    Min 1X/week      PT Plan      Co-evaluation              AM-PAC PT "6 Clicks" Mobility   Outcome Measure  Help needed turning from your back to your side while in a flat bed without using bedrails?: A Little Help needed moving from lying on your back to sitting on the side of a flat bed without using bedrails?: A Lot Help needed moving to and from a bed to a chair (including a wheelchair)?: A Lot Help needed standing up from a chair using your arms (e.g., wheelchair or bedside chair)?: A Little Help needed to walk in hospital room?: A Little Help needed climbing 3-5 steps with a railing? : Total 6 Click Score: 14    End of Session Equipment Utilized During Treatment: Gait belt Activity Tolerance: Patient tolerated treatment well Patient left: in chair;with call bell/phone within reach;with chair alarm set (belted chair alarm)   PT Visit Diagnosis: Unsteadiness on feet (R26.81);Other abnormalities of gait and mobility (R26.89);Repeated falls (R29.6);History of falling (Z91.81);Muscle weakness (generalized) (M62.81);Difficulty in walking, not elsewhere classified (R26.2)     Time: 1610-9604 PT Time Calculation (min) (ACUTE ONLY): 28 min  Charges:    $Gait Training: 8-22 mins $Therapeutic Activity: 8-22 mins PT General Charges $$ ACUTE PT VISIT: 1 Visit                     Andrey Farmer SPT Secure chat preferred    Darlin Drop 01/13/2023, 9:54 AM

## 2023-01-13 NOTE — Progress Notes (Signed)
   01/12/23 2117  BiPAP/CPAP/SIPAP  BiPAP/CPAP/SIPAP Pt Type Adult  Reason BIPAP/CPAP not in use Non-compliant (refuse to wear)  BiPAP/CPAP /SiPAP Vitals  Pulse Rate 71  SpO2 100 %

## 2023-01-13 NOTE — Care Management Important Message (Signed)
Important Message  Patient Details  Name: GIANN SCHAAD MRN: [id number] Date of Birth: 09-29-1945   Important Message Given:  Yes - Medicare IM     Sherilyn Banker 01/13/2023, 2:34 PM

## 2023-01-13 NOTE — TOC Progression Note (Signed)
Transition of Care [facility]) - Progression Note    Patient Details  Name: JEFFY KORCH MRN: [id number] Date of Birth: 1945-11-21  [facility] CM/SW Contact  Astrid Drafts Berna Spare, RN Phone Number: 01/13/2023, 2:22pm  Clinical Narrative:    Verified with Olegario Messier in admissions that patient is able to return to [facility] upon discharge.  Patient medically stable for return to facility, and insurance authorization is pending.  Received call from DSS SW Chasity [phone] : she states wife is concerned about him returning to facility, and is afraid he will be dc'd home.  Explained that facility plans to accept patient back, pending insurance auth.  CSW states she will follow up with wife, and is uncertain why she is having concerns about this.  Will provide updates on insurance auth when available.     Expected Discharge Plan: Skilled Nursing Facility Barriers to Discharge: Continued Medical Work up  Expected Discharge Plan and Services In-house Referral: Clinical Social Work   Post Acute Care Choice: Skilled Nursing Facility Living arrangements for the past 2 months: Skilled Nursing Facility                                       Social Determinants of Health (SDOH) Interventions    Readmission Risk Interventions     No data to display         Quintella Baton, RN, BSN  Trauma/Neuro ICU Case Manager [phone]

## 2023-01-13 NOTE — Progress Notes (Addendum)
[facility] Progress Note     Subjective: CC:  Per report patient got haldol overnight due to restlessness.  This morning he is oriented to self only. Conversant. Denies pain. About to get up with PT.   AFVSS   Objective: Vital signs in last 24 hours: Temp:  [97.5 F (36.4 C)-98.1 F (36.7 C)] 98.1 F (36.7 C) (11/12 0747) Pulse Rate:  [71-89] 81 (11/12 0747) Resp:  [15-19] 15 (11/12 0747) BP: (123-181)/(54-99) 163/87 (11/12 0747) SpO2:  [95 %-100 %] 95 % (11/12 0747) Last BM Date : 01/12/23  Intake/Output from previous day: 11/11 0701 - 11/12 0700 In: 420 [P.O.:420] Out: 875 [Urine:875] Intake/Output this shift: No intake/output data recorded.  PE: Gen:  alert, oriented to self Card: irregular - afib on monitor, HR 80's, pedal pulses 2+ BL Pulm:  Normal effort on room air, CTAB Abd: Soft, non-tender, non-distended, +BS Skin: warm and dry, no rashes  Psych: A&Ox1  Lab Results:  Recent Labs    01/11/23 0816 01/13/23 0507  WBC 8.5 4.2  HGB 10.3* 9.8*  HCT 31.9* 30.1*  PLT 108* 113*   BMET Recent Labs    01/11/23 0816 01/13/23 0507  NA 138 139  K 4.4 3.9  CL 104 105  CO2 25 27  GLUCOSE 91 97  BUN 19 19  CREATININE 1.14 0.97  CALCIUM 9.9 9.5   PT/INR No results for input(s): "LABPROT", "INR" in the last 72 hours.  CMP     Component Value Date/Time   NA 139 01/13/2023 0507   K 3.9 01/13/2023 0507   CL 105 01/13/2023 0507   CO2 27 01/13/2023 0507   GLUCOSE 97 01/13/2023 0507   BUN 19 01/13/2023 0507   CREATININE 0.97 01/13/2023 0507   CALCIUM 9.5 01/13/2023 0507   PROT 6.7 01/09/2023 1651   ALBUMIN 3.2 (L) 01/09/2023 1651   AST 22 01/09/2023 1651   ALT 10 01/09/2023 1651   ALKPHOS 118 01/09/2023 1651   BILITOT 1.0 01/09/2023 1651   GFRNONAA >60 01/13/2023 0507   Lipase  No results found for: "LIPASE"     Studies/Results: No results found.  Anti-infectives: Anti-infectives (From admission, onward)    None         Assessment/Plan  Unwitnessed Fall Possible concussion - TBI therapies L 8-10 rib fxs - IS, pulm toilet, pain control, muscle relaxers prn OSA - CPAP at night Age indeterminate T3 vertebral endplate deformity - no intervention Small fluid around R kidney - no evidence of injury Soft tissue mass in RLQ, can't rule out carcinoid - outpatient follow up ? Mass in TI/cecum - outpatient work up 2 non solid nodules in LLL - follow up CT outpatient in 3-6 months Dementia - namenda, sinemet, and aricept ordered CAD A fib - on Xarelto at home, on hold for now; hgb stablizing 10.3 from 10.9. plts 108 HTN - home meds re-ordered today HLD Anemia Bipolar disorder - He is on quite a few meds at home Remeron, lamictal, haldol. Dysphagia - SLP cleared for DYS 1  FEN - DYS 1 VTE - Lovenox, Xarelto on hold ID - none needed Admit - observation, med-surg, therapies.  Will review PT notes, medically stable for discharge to SNF.  Given recent falls, plan to hold xarelto and follow up outpatient with cardiology to discuss appropriateness of resumption.    LOS: 4 days   I reviewed nursing notes, ED provider notes, last 24 h vitals and pain scores, last 48 h intake and output,  last 24 h labs and trends, and last 24 h imaging results.  This care required moderate level of medical decision making.   Hosie Spangle, PA-C [facility] Please see Amion for pager number during day hours 7:00am-4:30pm

## 2023-01-13 NOTE — Progress Notes (Signed)
Report received from ED SBAR.  Ready to receive pt.

## 2023-01-13 NOTE — Progress Notes (Signed)
Pt arrived from ED via stretcher and transferred to bed.  Pt alert to self.  CCMD notified.  Vital signs taken.

## 2023-01-13 NOTE — Progress Notes (Addendum)
Contacted by patient's RN Marchelle Folks regarding choking issues/poor swallow function today noted at lunch time. She attempted to reach SLP but has not received response. He does not pass a bedside nursing swallow eval. Will keep npo for tonight until SLP can assess.

## 2023-01-13 NOTE — Care Management Important Message (Signed)
Important Message  Patient Details  Name: [name] [name] MRN: [id number] Date of Birth: May 01, 1945   Important Message Given:  Yes - Medicare IM     Dorena Bodo 01/13/2023, 1:05 PM

## 2023-01-14 DIAGNOSIS — S2249XA Multiple fractures of ribs, unspecified side, initial encounter for closed fracture: Secondary | ICD-10-CM

## 2023-01-14 LAB — GLUCOSE, CAPILLARY: Glucose-Capillary: 85 mg/dL (ref 70–99)

## 2023-01-14 MED ORDER — MIRTAZAPINE 15 MG PO TBDP
15.0000 mg | ORAL_TABLET | Freq: Every day | ORAL | Status: DC
  Administered 2023-01-15 – 2023-01-22 (×8): 15 mg via ORAL
  Filled 2023-01-14 (×10): qty 1

## 2023-01-14 MED ORDER — DEXTROSE-SODIUM CHLORIDE 5-0.45 % IV SOLN: INTRAVENOUS | Status: AC

## 2023-01-14 MED ORDER — METOPROLOL TARTRATE 5 MG/5ML IV SOLN
5.0000 mg | Freq: Every evening | INTRAVENOUS | Status: DC
  Administered 2023-01-14: 5 mg via INTRAVENOUS
  Filled 2023-01-14: qty 5

## 2023-01-14 MED ORDER — SODIUM CHLORIDE 0.9 % IV SOLN: INTRAVENOUS | Status: DC

## 2023-01-14 NOTE — Progress Notes (Signed)
Occupational Therapy Treatment Patient Details Name: [name] [name] MRN: [id number] DOB: 15-Feb-1946 Today's Date: 01/14/2023   History of present illness Pt is a 77 yo male admitted 11/8 for unwitnessed fall from [facility], CT scan 11/8 no signs of cervical fx, acute fx L ribs 8-10. PMH: Alzheimer's dementia, CAD, Afib on Xarelto, HTN, HLD, OSA on CPAP, bipolar disorder, and anemia   OT comments  OT session focused on ADLs and functional transfers during functional tasks. Pt lethargic during majority of session but participated well and is making progress toward goals. Pt currently demonstrates ability to complete UB ADLs with Mod to Max assist, LB ADLs with Max to Total assist +1, bed mobility in preparation for functional tasks with Min assist, and functional transfers with +1 hand held assist with Contact guard to Min assist. Pt requires Max cues throughout tasks for initiation, safety, and sequencing. Pt with noted edematous R hand this session with RN notified and with R UE in placed in supported elevated position for edema management at end of session. VSS on 2L continuous O2 through nasal cannula throughout session. Pt will benefit from continued acute skilled OT services to address deficits outlined below, decrease caregiver burden, and increase safety and independence with functional tasks. Post acute discharge, pt will benefit from intensive inpatient skilled rehab services < 3 hours per day to maximize rehab potential.       If plan is discharge home, recommend the following:  A little help with walking and/or transfers;A lot of help with bathing/dressing/bathroom;Assistance with cooking/housework;Direct supervision/assist for medications management;Direct supervision/assist for financial management;Assist for transportation;Help with stairs or ramp for entrance;Supervision due to cognitive status;Assistance with feeding   Equipment Recommendations  Other (comment) (defer to  next level of care)    Recommendations for Other Services      Precautions / Restrictions Precautions Precautions: Fall Restrictions Weight Bearing Restrictions: No       Mobility Bed Mobility Overal bed mobility: Needs Assistance Bed Mobility: Supine to Sit     Supine to sit: Min assist, HOB elevated     General bed mobility comments: cues for initiation and for sequencing    Transfers Overall transfer level: Needs assistance Equipment used: 1 person hand held assist Transfers: Sit to/from Stand, Bed to chair/wheelchair/BSC Sit to Stand: Min assist     Step pivot transfers: Contact guard assist, Min assist     General transfer comment: Min assist and cues for technique to power then then CGA with cues for sequencing once in standing     Balance Overall balance assessment: Needs assistance Sitting-balance support: No upper extremity supported, Feet supported, Single extremity supported Sitting balance-Leahy Scale: Fair     Standing balance support: Single extremity supported, During functional activity (Reliant on therapist for balance)                               ADL either performed or assessed with clinical judgement   ADL Overall ADL's : Needs assistance/impaired Eating/Feeding: NPO   Grooming: Wash/dry hands;Wash/dry face;Moderate assistance;Maximal assistance;Sitting;Cueing for sequencing (cues for initiation and to maintain attention to task)           Upper Body Dressing : Maximal assistance;Sitting;Cueing for sequencing (cues for initation; with increased time)   Lower Body Dressing: Maximal assistance;Total assistance;Bed level;Cueing for sequencing Lower Body Dressing Details (indicate cue type and reason): Pt able to follow commands to lift B feet to donn socks.  Toilet Transfer: Contact guard assist;Minimal assistance;Cueing for safety;Cueing for sequencing;Ambulation;BSC/3in1 (+1 hand held assist) Toilet Transfer Details  (indicate cue type and reason): simulated from bed to recliner         Functional mobility during ADLs: Contact guard assist;Minimal assistance;Cueing for safety;Cueing for sequencing (hand held assist +1)      Extremity/Trunk Assessment Upper Extremity Assessment Upper Extremity Assessment: Right hand dominant;Generalized weakness;RUE deficits/detail;LUE deficits/detail RUE Deficits / Details: pt noted to have edematous R hand on 11/13 that was not present on 11/9- RN notified; generalized weakness; decreased coordination; PROM/AAROM WFL RUE Coordination: decreased fine motor;decreased gross motor LUE Deficits / Details: generalized weakness; decreased coordination; pt with PROM/AAROM shoulder flexion to approx. 80 degrees, PROM/AAROM otherwise WFL LUE Coordination: decreased fine motor;decreased gross motor            Vision       Perception     Praxis      Cognition Arousal: Lethargic Behavior During Therapy: Flat affect Overall Cognitive Status: History of cognitive impairments - at baseline                                 General Comments: Largely lethargic throughout session but easy to wake and more alert in sitting and standing        Exercises      Shoulder Instructions       General Comments VSS on 2L continuous O2 through nasal cannula throughout session    Pertinent Vitals/ Pain       Pain Assessment Pain Assessment: No/denies pain  Home Living                                          Prior Functioning/Environment              Frequency  Min 1X/week        Progress Toward Goals  OT Goals(current goals can now be found in the care plan section)  Progress towards OT goals: Progressing toward goals  Acute Rehab OT Goals Patient Stated Goal: Pt did not state.  Plan      Co-evaluation                 AM-PAC OT "6 Clicks" Daily Activity     Outcome Measure   Help from another person eating  meals?: Total (NPO) Help from another person taking care of personal grooming?: A Lot Help from another person toileting, which includes using toliet, bedpan, or urinal?: Total Help from another person bathing (including washing, rinsing, drying)?: Total Help from another person to put on and taking off regular upper body clothing?: A Lot Help from another person to put on and taking off regular lower body clothing?: Total 6 Click Score: 8    End of Session Equipment Utilized During Treatment: Gait belt;Oxygen  OT Visit Diagnosis: Ataxia, unspecified (R27.0);History of falling (Z91.81);Muscle weakness (generalized) (M62.81)   Activity Tolerance Patient limited by lethargy   Patient Left in chair;with call bell/phone within reach;with chair alarm set;with restraints reapplied;Other (comment) (belt alarm on)   Nurse Communication Mobility status;Other (comment) (restraints reapplied; pt sitting in chair asleep with belt alarm in use at end of session; pt with noted edematous R hand; pt requiring CGA to Min assist with Max cues for functional transfer)  Time: 6578-4696 OT Time Calculation (min): 49 min  Charges: OT General Charges $OT Visit: 1 Visit OT Treatments $Self Care/Home Management : 38-52 mins  Duff Pozzi "Orson Eva., OTR/L, MA Acute Rehab [phone]   Lendon Colonel 01/14/2023, 5:54 PM

## 2023-01-14 NOTE — TOC Progression Note (Signed)
Transition of Care [facility]) - Progression Note    Patient Details  Name: XIN BOSSHARDT MRN: [id number] Date of Birth: 1945-05-21  [facility]) CM/SW Contact  Glennon Mac, RN Phone Number: 01/14/2023, 1:58 PM  Clinical Narrative:    Insurance denied SNF stay, and offered peer to peer,due by 3:30 pm today @ [phone] opt#5.  Spoke with providers; patient now having issues swallowing and is not medically stable for dc to SNF, and palliative consult has been ordered for goals of care. Will plan to hold for now on P2P; will follow patient's progress.    Expected Discharge Plan: Skilled Nursing Facility Barriers to Discharge: Continued Medical Work up  Expected Discharge Plan and Services In-house Referral: Clinical Social Work   Post Acute Care Choice: Skilled Nursing Facility Living arrangements for the past 2 months: Skilled Nursing Facility                                       Social Determinants of Health (SDOH) Interventions    Readmission Risk Interventions     No data to display         Quintella Baton, RN, BSN  Trauma/Neuro ICU Case Manager [phone]

## 2023-01-14 NOTE — Progress Notes (Signed)
Daughter and Wife will be coming by to visit with patient tomorrow (11/14) afternoon.  Daughter expecting conversation with palliative.

## 2023-01-14 NOTE — Plan of Care (Signed)
  Problem: Health Behavior/Discharge Planning: Goal: Ability to manage health-related needs will improve Outcome: Progressing   Problem: Health Behavior/Discharge Planning: Goal: Ability to manage health-related needs will improve Outcome: Progressing   Problem: Education: Goal: Knowledge of General Education information will improve Description: Including pain rating scale, medication(s)/side effects and non-pharmacologic comfort measures Outcome: Progressing   Problem: Clinical Measurements: Goal: Ability to maintain clinical measurements within normal limits will improve Outcome: Progressing   Problem: Clinical Measurements: Goal: Will remain free from infection Outcome: Progressing   Problem: Clinical Measurements: Goal: Diagnostic test results will improve Outcome: Progressing   Problem: Clinical Measurements: Goal: Respiratory complications will improve Outcome: Progressing   Problem: Activity: Goal: Risk for activity intolerance will decrease Outcome: Progressing   Problem: Nutrition: Goal: Adequate nutrition will be maintained Outcome: Progressing

## 2023-01-14 NOTE — Progress Notes (Cosign Needed Addendum)
[facility] Progress Note     Subjective: CC: Patient choking on lunch yesterday per RN. Haldol x2 yesterday afternoon/last night for agitation. He is somnolent at time of my visit. Per bedside RN he was up most of last night and was still alert and singing until just prior to my visit  Objective: Vital signs in last 24 hours: Temp:  [97.5 F (36.4 C)-98.5 F (36.9 C)] 98.3 F (36.8 C) (11/13 0718) Pulse Rate:  [69-94] 79 (11/13 0718) Resp:  [15-25] 20 (11/13 0718) BP: (126-172)/(64-90) 154/81 (11/13 0718) SpO2:  [95 %-100 %] 98 % (11/13 0336) Weight:  [70.3 kg] 70.3 kg (11/12 1400) Last BM Date : 01/12/23  Intake/Output from previous day: 11/12 0701 - 11/13 0700 In: 120 [P.O.:120] Out: 1600 [Urine:1600] Intake/Output this shift: No intake/output data recorded.  PE: Gen: groans to verbal and tactile stimuli Card: irregular - afib on monitor, HR 80's Pulm:  Normal effort on room air, CTAB Abd: Soft, non-tender, non-distended, Skin: warm and dry, no rashes  Psych: somnolent this am  Lab Results:  Recent Labs    01/11/23 0816 01/13/23 0507  WBC 8.5 4.2  HGB 10.3* 9.8*  HCT 31.9* 30.1*  PLT 108* 113*   BMET Recent Labs    01/11/23 0816 01/13/23 0507  NA 138 139  K 4.4 3.9  CL 104 105  CO2 25 27  GLUCOSE 91 97  BUN 19 19  CREATININE 1.14 0.97  CALCIUM 9.9 9.5   PT/INR No results for input(s): "LABPROT", "INR" in the last 72 hours.  CMP     Component Value Date/Time   NA 139 01/13/2023 0507   K 3.9 01/13/2023 0507   CL 105 01/13/2023 0507   CO2 27 01/13/2023 0507   GLUCOSE 97 01/13/2023 0507   BUN 19 01/13/2023 0507   CREATININE 0.97 01/13/2023 0507   CALCIUM 9.5 01/13/2023 0507   PROT 6.7 01/09/2023 1651   ALBUMIN 3.2 (L) 01/09/2023 1651   AST 22 01/09/2023 1651   ALT 10 01/09/2023 1651   ALKPHOS 118 01/09/2023 1651   BILITOT 1.0 01/09/2023 1651   GFRNONAA >60 01/13/2023 0507   Lipase  No results found for:  "LIPASE"     Studies/Results: No results found.  Anti-infectives: Anti-infectives (From admission, onward)    None        Assessment/Plan  Unwitnessed Fall Possible concussion - TBI therapies L 8-10 rib fxs - IS, pulm toilet, pain control, muscle relaxers prn OSA - CPAP at night Age indeterminate T3 vertebral endplate deformity - no intervention Small fluid around R kidney - no evidence of injury Soft tissue mass in RLQ, can't rule out carcinoid - outpatient follow up ? Mass in TI/cecum - outpatient work up 2 non solid nodules in LLL - follow up CT outpatient in 3-6 months Dementia - namenda, sinemet, and aricept ordered CAD Persistent A fib - on Xarelto at home, on hold for now; hgb stablizing at 9.8 yesterday. Plt stable HTN - home meds re-ordered  HLD Anemia Bipolar disorder - He is on quite a few meds at home Remeron, lamictal, haldol. Dysphagia - SLP cleared for DYS 1. Per RN choking on meals yesterday. NPO for now and await repeat SLP eval  FEN - NPO for swallow eval VTE - Lovenox, Xarelto on hold ID - none needed Admit - observation, med-surg, therapies.  Await repeat SLP eval. If cleared for diet then may be medically stable for SNF as early as today  Given recent  falls, plan to hold xarelto and follow up outpatient with cardiology to discuss appropriateness of resumption.    LOS: 5 days   I reviewed nursing notes, ED provider notes, last 24 h vitals and pain scores, last 48 h intake and output, last 24 h labs and trends, and last 24 h imaging results.   Eric Form, [facility] 01/14/2023, 7:41 AM Please see Amion for pager number during day hours 7:00am-4:30pm

## 2023-01-14 NOTE — Progress Notes (Signed)
Speech Language Pathology Treatment: Dysphagia  Patient Details Name: [name] [name] MRN: [id number] DOB: 10/01/1945 Today's Date: 01/14/2023 Time: 0820-0906 SLP Time Calculation (min) (ACUTE ONLY): 46 min  Assessment / Plan / Recommendation Clinical Impression  Requested to re-assess swallowing due to coughing excessively with POs yesterday. Pt sleepy but alerted and responded verbally after his face was washed and oral care provided. He was noted to be holding secretions upon arrival, and after oral care continued to have wet voice at baseline despite cues to cough and swallow.  He required max verbal/visual/tactile cues to sip through a straw and initiate a swallow response. The tendency to hold POs orally and swish them in mouth was more pervasive today. When he eventually initiated a pharyngeal swallow, he did so 2-3 times with a single bolus and would cough after each trial.  His nurse was present - we attempted to offer some meds crushed in applesauce.  Despite maximal cues/encouragement, most of the meds had to be suctioned from his mouth.  Response to POs was consistent with liquids and pureed solids.  I'm concerned that this most recent fall and admission has pushed him over the functional swallowing threshold and has accelerated his dysphagia.  His swallowing is more likely to fluctuate, leading to some days allowing him to eat more functionally and other days preventing him from eating without excessive oral holding and aspiration.  He is just as likely to aspirate secretions at this point, even if NPO. He is not a candidate for TF given his dx of Alzheimer's.  Mr. Ivey and his family would benefit from a Palliative Care consult at this juncture to determine GOC.  I attempted to call his daughter two times without success (went to VM).  D/W RN, Marchelle Folks.  SLP will follow along.  He should remain NPO except sips and chips until further decisions are made.    HPI HPI: This is a 77 yo white  male who currently resides at [facility] has a history of Alzheimer's dementia, CAD, a fib on Xarelto, HTN, HLD, OSA on CPAP, bipolar disorder, and anemia who recently travelled to [facility] and had a fall and was admitted and found to have no injuries and generalized weakness.  He was discharged but then admitted to [facility] two days later secondary to another fall.  He was then discharged to [facility]/SNF.  He apparently has been having behavioral issues related to his dementia and the recommendation has been for DC from SNF to a memory care unit.  On 01/09/23 around 9am he was found on the floor after and unwitnessed fall.  He was neurologically at his baseline at that time.  Throughout the day he has become more lethargic.  He was brought into the ED as a level 1 trauma.  He arrives with a GCS of 9.  As he has been here longer this has improved and he is now spontaneously opening his eyes and now intelligibly speaking at times.  He is still fairly lethargic otherwise though.  Trauma work up has revealed L 8-10 rib fxs, and concussion.  ST consulted for swallow evaluation.  Pt currently NPO.      SLP Plan  Continue with current plan of care      Recommendations for follow up therapy are one component of a multi-disciplinary discharge planning process, led by the attending physician.  Recommendations may be updated based on patient status, additional functional criteria and insurance authorization.    Recommendations  Diet recommendations:  NPO except sips and chips Liquids provided via: Cup;Straw Medication Administration: Crushed with puree Supervision: Trained caregiver to feed patient Compensations: Minimize environmental distractions Postural Changes and/or Swallow Maneuvers: Seated upright 90 degrees                  Oral care BID   Frequent or constant Supervision/Assistance Dysphagia, oropharyngeal phase (R13.12)     Continue with current plan of care   [name] Thain L. Samson Frederic, MA  CCC/SLP Clinical Specialist - Acute Care SLP Acute Rehabilitation Services Office number [phone]   [name]  01/14/2023, 9:07 AM

## 2023-01-14 NOTE — Consult Note (Signed)
                                                                                   Consultation Note Date: 01/14/2023 at 1500  Patient Name: [name] [name]  DOB: 1945/04/24  MRN: [id number]  Age / Sex: 77 y.o., male  PCP: Merri Brunette, MD Referring Physician: Md, Trauma, MD  Clinical Assessment and Goals of Care: Extensive chart review completed prior to meeting patient including labs, vital signs, imaging, progress notes, orders, and available advanced directive documents from current and previous encounters. I then met with patient at bedside to discuss diagnosis prognosis, GOC, EOL wishes, disposition and options.  OT was at bedside.  OT endorses that patient was just a standby assist who required multiple cues for movement.  He does not require physical support but more so cognitive cueing.  Patient has just moved patient from bed to chair.  Mittens are in place.  He is resting comfortably.  He awakens to my presence easily but goes back to sleep quickly.  He is unable to participate in goals of care medical decision making independently at this time.  I attempted to speak with patient's wife over the phone.  No answer.  I was unable to leave a voicemail.  I will attempt to initiate goals of care discussions with the patient and family at a later date.    Signed by: Georgiann Cocker, DNP, FNP-BC Palliative Medicine   Please contact Palliative Medicine Team providers via [facility] for questions and concerns.   No charge

## 2023-01-14 NOTE — Progress Notes (Addendum)
Mobility Specialist Progress Note:   01/14/23 1627  Mobility  Activity Transferred from chair to bed  Level of Assistance Minimal assist, patient does 75% or more (+2)  Assistive Device  (HHA)  Distance Ambulated (ft) 3 ft  Activity Response Tolerated well  $Mobility charge 1 Mobility  Mobility Specialist Start Time (ACUTE ONLY) 1615  Mobility Specialist Stop Time (ACUTE ONLY) 1625  Mobility Specialist Time Calculation (min) (ACUTE ONLY) 10 min   Pt received in chair, agreeable to return to bed per RN request. ModA verbal cues to get pt to stand and pivot to bed with HHA +2. Asx throughout. Pt left in bed with RN still present in room.   Leory Plowman Mobility Specialist Please contact via [employer] office at [phone]

## 2023-01-14 NOTE — Discharge Summary (Signed)
[facility] Discharge Summary   Patient ID: [name] [name] MRN: [id number] DOB/AGE: June 21, 1945 77 y.o.  Admit date: 01/09/2023 Discharge date: 01/23/2023   Discharge Diagnosis Unwitnessed Fall Possible concussion Left 8-10 rib fractures OSA Age indeterminate T3 vertebral endplate deformity Small fluid around Right kidney Soft tissue mass in RLQ, can't rule out carcinoid ? Mass in TI/cecum  2 non solid nodules in LLL Dementia CAD Persistent A fib  HTN  HLD Anemia Bipolar disorder Dysphagia  Consultants None  Imaging: No results found.  Procedures None  HPI:  [name] [name] is a 77 y.o. male who currently resides at [facility] has a history of Alzheimer's dementia, CAD, a fib on Xarelto, HTN, HLD, OSA on CPAP, bipolar disorder, and anemia who recently travelled to [facility] and had a fall and was admitted and found to have no injuries and generalized weakness.  He was discharged but then admitted to [facility] two days later secondary to another fall.  He was then discharged to [facility]NF.  He apparently has been having behavioral issues related to his dementia and the recommendation has been for DC from SNF to a memory care unit.   On 01/09/23 around 9am he was found on the floor after an unwitnessed fall.  He was neurologically at his baseline at that time.  Throughout the day he has become more lethargic.  He was brought into the ED as a level 1 secondary to mechanism and on anticoagulation.  He was given his Xarelto this morning according to the notes from [facility].  He arrives with a GCS of 9.  As he has been here longer this has improved and he is now spontaneously opening his eyes and now intelligibly speaking at times.  He is still fairly lethargic otherwise though.  Trauma work up has revealed L 8-10 rib fxs, and several other incidentalomas.     Hospital Course: Possible concussion  Patient with altered mental status on arrival. CT head negative for acute  finding. Patient worked with TBI therapies.  Recommended for return to SNF.    Left 8-10 rib fractures Multimodal pain control and pulmonary toilet  OSA CPAP at night  Age indeterminate T3 vertebral endplate deformity No intervention  Small fluid around R kidney  No evidence of injury, no intervention  Persistent A fib Xarelto held upon admission. Recommend follow up with cardiologist after discharge to discuss appropriateness of resuming this medication.  Dysphagia  Patient evaluated by SLP and was cleared for dysphagia 1 diet.  When his mentation worsens he struggles with aspiration, but as long as he is alert, he does well with a D1 diet.  Incidental findings: -Soft tissue mass in RLQ, can't rule out carcinoid - outpatient follow up -? Mass in TI/cecum - outpatient work up -2 non solid nodules in LLL - follow up CT outpatient in 3-6 months  Patient worked with therapies during this admission. On HD 13, the patient was felt medically stable for discharge back to his SNF.  Patient will follow up as below and knows to call with questions or concerns.    I have personally reviewed the patients medication history on the [place] controlled substance database.    Allergies as of 01/23/2023       Reactions   Latex         Medication List     STOP taking these medications    gabapentin 300 MG capsule Commonly known as: NEURONTIN   tamsulosin 0.4 MG Caps capsule  Commonly known as: FLOMAX   Xarelto 20 MG Tabs tablet Generic drug: rivaroxaban       TAKE these medications    acetaminophen 500 MG tablet Commonly known as: TYLENOL Take 2 tablets (1,000 mg total) by mouth every 6 (six) hours as needed.   atorvastatin 40 MG tablet Commonly known as: LIPITOR Take 40 mg by mouth every evening.   Azelaic Acid 15 % gel Apply 1 Application topically 2 (two) times daily. Nose & facial creases   B COMPLEX PO Take 1 tablet by mouth daily.   carbidopa-levodopa 25-250 MG  tablet Commonly known as: SINEMET IR Take 1 tablet by mouth 2 (two) times daily. 0900 & 1700   divalproex 125 MG DR tablet Commonly known as: DEPAKOTE Take 250-500 mg by mouth See admin instructions. Take 250mg  (2 tablets) by mouth every morning and 500mg  (4 tablets) every evening.   donepezil 10 MG tablet Commonly known as: ARICEPT Take 10 mg by mouth at bedtime.   folic acid 1 MG tablet Commonly known as: FOLVITE Take 1 mg by mouth at bedtime.   haloperidol lactate 5 MG/ML injection Commonly known as: HALDOL Inject 2.5 mg into the muscle once.   hydrOXYzine 25 MG tablet Commonly known as: ATARAX Take 25 mg by mouth every 8 (eight) hours as needed for anxiety. X14 days.   lamoTRIgine 150 MG tablet Commonly known as: LAMICTAL Take 150 mg by mouth 2 (two) times daily.   lisinopril 10 MG tablet Commonly known as: ZESTRIL Take 10 mg by mouth every evening.   LORazepam 2 MG/ML concentrated solution Commonly known as: ATIVAN Take 0.5 mg by mouth once.   memantine 10 MG tablet Commonly known as: NAMENDA Take 10 mg by mouth 2 (two) times daily.   methocarbamol 500 MG tablet Commonly known as: ROBAXIN Take 1 tablet (500 mg total) by mouth every 8 (eight) hours as needed for muscle spasms.   mirtazapine 15 MG tablet Commonly known as: REMERON Take 15 mg by mouth at bedtime.   Repatha SureClick 140 MG/ML Soaj Generic drug: Evolocumab Inject 140 mg into the skin every 14 (fourteen) days.          Contact information for follow-up providers     Merri Brunette, MD Follow up.   Specialty: Internal Medicine Why: Recommend post-hospitalization follow up appointment with your primary care physician. In addition to follow up regarding rib fractures, discuss incidental findings noted on CT scan (Soft tissue mass in RLQ, ? Mass in TI/cecum, lung nodules) Contact information: [address] [place] [address] [place] [address] [phone]         Cardiologist Follow  up.   Why: Plan to hold xarelto and follow up outpatient with cardiology to discuss appropriateness of resumption.             Contact information for after-discharge care     Destination     [facility] Preferred SNF .   Service: Skilled Nursing Contact information: [address] [place] [address] [phone]                      Signed: Trixie Deis, [facility] 01/23/2023, 2:30 PM Please see Amion for pager number during day hours 7:00am-4:30pm

## 2023-01-15 DIAGNOSIS — F03918 Unspecified dementia, unspecified severity, with other behavioral disturbance: Secondary | ICD-10-CM | POA: Diagnosis not present

## 2023-01-15 DIAGNOSIS — S2249XA Multiple fractures of ribs, unspecified side, initial encounter for closed fracture: Secondary | ICD-10-CM | POA: Diagnosis not present

## 2023-01-15 DIAGNOSIS — Z515 Encounter for palliative care: Secondary | ICD-10-CM | POA: Diagnosis not present

## 2023-01-15 LAB — GLUCOSE, CAPILLARY
Glucose-Capillary: 104 mg/dL — ABNORMAL HIGH (ref 70–99)
Glucose-Capillary: 108 mg/dL — ABNORMAL HIGH (ref 70–99)
Glucose-Capillary: 132 mg/dL — ABNORMAL HIGH (ref 70–99)
Glucose-Capillary: 136 mg/dL — ABNORMAL HIGH (ref 70–99)
Glucose-Capillary: 183 mg/dL — ABNORMAL HIGH (ref 70–99)

## 2023-01-15 MED ORDER — LORAZEPAM 2 MG/ML IJ SOLN
0.5000 mg | INTRAMUSCULAR | Status: DC | PRN
  Filled 2023-01-15: qty 1

## 2023-01-15 MED ORDER — OXYCODONE HCL 5 MG PO TABS
2.5000 mg | ORAL_TABLET | ORAL | Status: DC | PRN
  Administered 2023-01-15: 5 mg via ORAL
  Filled 2023-01-15: qty 1

## 2023-01-15 MED ORDER — QUETIAPINE FUMARATE 25 MG PO TABS
25.0000 mg | ORAL_TABLET | Freq: Every day | ORAL | Status: DC
  Administered 2023-01-15 – 2023-01-22 (×8): 25 mg via ORAL
  Filled 2023-01-15 (×8): qty 1

## 2023-01-15 NOTE — Progress Notes (Signed)
                                                                                   Patient Name: [name] [name]  DOB: 08-04-1945  MRN: [id number]  Age / Sex: 77 y.o., male  PCP: Merri Brunette, MD Referring Physician: Md, Trauma, MD  Clinical Assessment and Goals of Care: Extensive chart review completed prior to meeting patient including labs, vital signs, imaging, progress notes, orders, and available advanced directive documents from current and previous encounters. I then met with patient at bedside to discuss diagnosis prognosis, GOC, EOL wishes, disposition and options.  OT was at bedside.  OT endorses that patient was just a standby assist who required multiple cues for movement.  He does not require physical support but more so cognitive cueing.  Patient has just moved patient from bed to chair.  Mittens are in place.  He is resting comfortably.  He awakens to my presence easily but goes back to sleep quickly.  He is unable to participate in goals of care medical decision making independently at this time.  I attempted to speak with patient's wife over the phone.  No answer.  I was unable to leave a voicemail.  I will attempt to initiate goals of care discussions with the patient and family at a later date.    Signed by: Georgiann Cocker, DNP, FNP-BC Palliative Medicine   Please contact Palliative Medicine Team providers via [facility] for questions and concerns.   No charge

## 2023-01-15 NOTE — Plan of Care (Signed)
  Problem: Education: Goal: Knowledge of General Education information will improve Description: Including pain rating scale, medication(s)/side effects and non-pharmacologic comfort measures Outcome: Progressing   Problem: Health Behavior/Discharge Planning: Goal: Ability to manage health-related needs will improve Outcome: Progressing   Problem: Clinical Measurements: Goal: Ability to maintain clinical measurements within normal limits will improve Outcome: Progressing   Problem: Clinical Measurements: Goal: Will remain free from infection Outcome: Progressing   Problem: Clinical Measurements: Goal: Respiratory complications will improve Outcome: Progressing   Problem: Nutrition: Goal: Adequate nutrition will be maintained Outcome: Progressing

## 2023-01-15 NOTE — Progress Notes (Signed)
Provided patient with puree lunch, he ate 100%, coughed twice.

## 2023-01-15 NOTE — Progress Notes (Signed)
Speech Language Pathology Treatment: Dysphagia  Patient Details Name: [name] [name] MRN: [id number] DOB: 14-Apr-1945 Today's Date: 01/15/2023 Time: 1009-1030 SLP Time Calculation (min) (ACUTE ONLY): 21 min  Assessment / Plan / Recommendation Clinical Impression  [name] [name] was more alert and interactive today.  He was sitting in recliner. RN present. Assisted with med administration and provided with pudding and sips of ice water.  He shows improved attention today and was able to drink/eat a bit before coughing ensued.  D/W RN.  Recommend advancing diet back to dysphagia 1/thin liquids.  Dysphagia will continue to wax and wane; the risk of aspiration and its consequences will remain and likely worsen over time.  PO intake will also be inconsistent.  D/W [name] [name] from [place] care. Will resume dysphagia 1/thin liquids; please hold tray when there is excessive coughing. SLP will follow.  GOC meeting pending.    HPI HPI: This is a 77 yo white male who currently resides at [facility] has a history of Alzheimer's dementia, CAD, a fib on Xarelto, HTN, HLD, OSA on CPAP, bipolar disorder, and anemia who recently travelled to [facility] and had a fall and was admitted and found to have no injuries and generalized weakness.  He was discharged but then admitted to [facility] two days later secondary to another fall.  He was then discharged to [facility].  He apparently has been having behavioral issues related to his dementia and the recommendation has been for DC from SNF to a memory care unit.  On 01/09/23 around 9am he was found on the floor after and unwitnessed fall.  He was neurologically at his baseline at that time.  Throughout the day he has become more lethargic.  He was brought into the ED as a level 1 trauma.  He arrives with a GCS of 9.  As he has been here longer this has improved and he is now spontaneously opening his eyes and now intelligibly speaking at times.  He is still fairly lethargic  otherwise though.  Trauma work up has revealed L 8-10 rib fxs, and concussion.  ST consulted for swallow evaluation.  Pt currently NPO.      SLP Plan  Continue with current plan of care      Recommendations for follow up therapy are one component of a multi-disciplinary discharge planning process, led by the attending physician.  Recommendations [name] be updated based on patient status, additional functional criteria and insurance authorization.    Recommendations  Diet recommendations: Dysphagia 1 (puree);Thin liquid Liquids provided via: Cup;Straw Medication Administration: Crushed with puree Supervision: Trained caregiver to feed patient Compensations: Minimize environmental distractions Postural Changes and/or Swallow Maneuvers: Seated upright 90 degrees                  Oral care BID   Frequent or constant Supervision/Assistance Dysphagia, oropharyngeal phase (R13.12)     Continue with current plan of care    [name] [name] L. [name] Frederic, MA CCC/SLP Clinical Specialist - Acute Care SLP Acute Rehabilitation Services Office number [phone]  [name] [name] [name] [name]  01/15/2023, 10:33 AM

## 2023-01-15 NOTE — Progress Notes (Signed)
Physical Therapy Treatment Patient Details Name: MIQUAN YEAGLEY MRN: [id number] DOB: 1945-12-26 Today's Date: 01/15/2023   History of Present Illness Pt is a 77 yo male admitted 11/8 for unwitnessed fall from [facility], CT scan 11/8 no signs of cervical fx, acute fx L ribs 8-10. PMH: Alzheimer's dementia, CAD, Afib on Xarelto, HTN, HLD, OSA on CPAP, bipolar disorder, and anemia    PT Comments  Pt animated and active during mobility today. Pt was modA for bed mobility due to initial lethargy and increased posterior lean, once EOB pt was alert and kept eyes open remainder of session. Still only oriented to self. Tolerated increased ambulation distance with short bouts of no handheld support.     If plan is discharge home, recommend the following: Two people to help with walking and/or transfers;Two people to help with bathing/dressing/bathroom;Assistance with cooking/housework;Assist for transportation;Help with stairs or ramp for entrance;Supervision due to cognitive status   Can travel by private vehicle     Yes  Equipment Recommendations  None recommended by PT    Recommendations for Other Services       Precautions / Restrictions Precautions Precautions: Fall Restrictions Weight Bearing Restrictions: No     Mobility  Bed Mobility Overal bed mobility: Needs Assistance Bed Mobility: Supine to Sit     Supine to sit: HOB elevated, Mod assist     General bed mobility comments: cues for initiation and for sequencing, lethargic through bed mobility, more alert once sitting EOB    Transfers Overall transfer level: Needs assistance   Transfers: Sit to/from Stand Sit to Stand: Min assist           General transfer comment: Pt powered up by pulling through therapist arm, required some steadying once standing    Ambulation/Gait Ambulation/Gait assistance: Min assist Gait Distance (Feet): 500 Feet Assistive device: 1 person hand held assist Gait  Pattern/deviations: Decreased step length - left, Decreased step length - right, Decreased stride length, Knee flexed in stance - left, Knee flexed in stance - right, Shuffle, Drifts right/left, Narrow base of support   Gait velocity interpretation: <1.8 ft/sec, indicate of risk for recurrent falls   General Gait Details: pt ambulated with hand held assist, trialed without hand hold would reach out for wall railing. cueing for wider BOS throughout and increasing step length pt wopuld take short breaks due to distraction but easily reoriented to task.   Stairs             Wheelchair Mobility     Tilt Bed    Modified Rankin (Stroke Patients Only)       Balance Overall balance assessment: Needs assistance Sitting-balance support: No upper extremity supported, Feet supported, Single extremity supported Sitting balance-Leahy Scale: Fair     Standing balance support: Single extremity supported, During functional activity (Reliant on therapist for balance)   Standing balance comment: ambulated with gait bait support for 98ft at a time before searching for support                            Cognition Arousal: Lethargic Behavior During Therapy: Flat affect Overall Cognitive Status: History of cognitive impairments - at baseline                                 General Comments: Pt animated and maintain conversation throughout session  Exercises      General Comments        Pertinent Vitals/Pain      Home Living                          Prior Function            PT Goals (current goals can now be found in the care plan section) Progress towards PT goals: Progressing toward goals    Frequency    Min 1X/week      PT Plan      Co-evaluation              AM-PAC PT "6 Clicks" Mobility   Outcome Measure  Help needed turning from your back to your side while in a flat bed without using bedrails?: A  Little Help needed moving from lying on your back to sitting on the side of a flat bed without using bedrails?: A Lot Help needed moving to and from a bed to a chair (including a wheelchair)?: A Lot Help needed standing up from a chair using your arms (e.g., wheelchair or bedside chair)?: A Little Help needed to walk in hospital room?: A Little Help needed climbing 3-5 steps with a railing? : Total 6 Click Score: 14    End of Session Equipment Utilized During Treatment: Gait belt Activity Tolerance: Patient tolerated treatment well Patient left: in chair;with call bell/phone within reach;with chair alarm set (belted chair alarm)   PT Visit Diagnosis: Unsteadiness on feet (R26.81);Other abnormalities of gait and mobility (R26.89);Repeated falls (R29.6);History of falling (Z91.81);Muscle weakness (generalized) (M62.81);Difficulty in walking, not elsewhere classified (R26.2)     Time: 1610-9604 PT Time Calculation (min) (ACUTE ONLY): 32 min  Charges:    $Gait Training: 8-22 mins $Therapeutic Activity: 8-22 mins PT General Charges $$ ACUTE PT VISIT: 1 Visit                     Andrey Farmer. SPT Secure chat preferred    Darlin Drop 01/15/2023, 11:24 AM

## 2023-01-15 NOTE — Progress Notes (Addendum)
[facility] Progress Note     Subjective: More alert and talkative at time of my visit today. Oriented to self and follows commands but otherwise confused and not oriented. Haldol once overnight  Objective: Vital signs in last 24 hours: Temp:  [97.5 F (36.4 C)-98.4 F (36.9 C)] 98.2 F (36.8 C) (11/14 0346) Pulse Rate:  [60-83] 80 (11/14 0346) Resp:  [14-20] 20 (11/14 0346) BP: (145-165)/(69-111) 163/85 (11/14 0346) SpO2:  [92 %-100 %] 99 % (11/14 0346) Last BM Date : 01/12/23  Intake/Output from previous day: 11/13 0701 - 11/14 0700 In: 900 [I.V.:900] Out: -  Intake/Output this shift: No intake/output data recorded.  PE: Gen: lying in bed, NAD.  Pulm:  Normal effort on 2 lpm [place] Abd: Soft, non-tender, non-distended, Skin: warm and dry, no rashes  Psych: oriented to self. Follows commands  Lab Results:  Recent Labs    01/13/23 0507  WBC 4.2  HGB 9.8*  HCT 30.1*  PLT 113*   BMET Recent Labs    01/13/23 0507  NA 139  K 3.9  CL 105  CO2 27  GLUCOSE 97  BUN 19  CREATININE 0.97  CALCIUM 9.5   PT/INR No results for input(s): "LABPROT", "INR" in the last 72 hours.  CMP     Component Value Date/Time   NA 139 01/13/2023 0507   K 3.9 01/13/2023 0507   CL 105 01/13/2023 0507   CO2 27 01/13/2023 0507   GLUCOSE 97 01/13/2023 0507   BUN 19 01/13/2023 0507   CREATININE 0.97 01/13/2023 0507   CALCIUM 9.5 01/13/2023 0507   PROT 6.7 01/09/2023 1651   ALBUMIN 3.2 (L) 01/09/2023 1651   AST 22 01/09/2023 1651   ALT 10 01/09/2023 1651   ALKPHOS 118 01/09/2023 1651   BILITOT 1.0 01/09/2023 1651   GFRNONAA >60 01/13/2023 0507   Lipase  No results found for: "LIPASE"     Studies/Results: No results found.  Anti-infectives: Anti-infectives (From admission, onward)    None        Assessment/Plan  Unwitnessed Fall Possible concussion - TBI therapies L 8-10 rib fxs - IS, pulm toilet, pain control, muscle relaxers prn OSA - CPAP at  night Age indeterminate T3 vertebral endplate deformity - no intervention Small fluid around R kidney - no evidence of injury Soft tissue mass in RLQ, can't rule out carcinoid - outpatient follow up ? Mass in TI/cecum - outpatient work up 2 non solid nodules in LLL - follow up CT outpatient in 3-6 months Dementia - namenda, sinemet, and aricept ordered. Held currently with NPO status. Mental status has been waxing and waning. Palliative consult given recent decline in mental status and worsened dysphagia in setting of head injury and palliative consulted. Wife and daughter to visit this pm. Appreciate palliative assistance CAD Persistent A fib - on Xarelto at home, on hold for now; hgb stablizing at 9.8 yesterday. Plt stable HTN - home meds re-ordered  HLD Anemia Bipolar disorder - He is on quite a few meds at home Remeron, lamictal, haldol. Dysphagia - SLP cleared for DYS 1. Per RN choking on meals 11/12. Repeat SLP eval 11/13 and recc NPO   FEN - NPO sips/chips VTE - Lovenox, Xarelto on hold ID - none needed Admit - observation, med-surg, therapies.  Given recent falls, plan to hold xarelto and follow up outpatient with cardiology to discuss appropriateness of resumption.   Palliative consult pending   LOS: 6 days   I reviewed nursing notes,  ED provider notes, last 24 h vitals and pain scores, last 48 h intake and output, last 24 h labs and trends, and last 24 h imaging results.   Eric Form, [facility] 01/15/2023, 7:49 AM Please see Amion for pager number during day hours 7:00am-4:30pm

## 2023-01-15 NOTE — Consult Note (Signed)
Consultation Note Date: 01/15/2023 at 1000  Patient Name: [name] [name]  DOB: 1945/07/07  MRN: [id number]  Age / Sex: 77 y.o., male  PCP: Merri Brunette, MD Referring Physician: Md, Trauma, MD  HPI/Patient Profile: 77 y.o. male  with past medical history of Alzheimer's dementia, CAD, a fib on Xarelto, HTN, HLD, OSA on CPAP, bipolar disorder, and anemia  admitted on 01/09/2023 with witnessed fall at [facility].  Patient found to have left rib fractures and worsening dysphagia.  SLP consulted.  Patient was made n.p.o. but with re-eval on 11/14 was advanced to dysphagia 1 with thin liquids.  TOC following closely for discharge planning.  PMT was consulted to discuss goals of care.   Clinical Assessment and Goals of Care: Extensive chart review completed prior to meeting patient including labs, vital signs, imaging, progress notes, orders, and available advanced directive documents from current and previous encounters. I then met with patient with his wife and daughter at bedside.  Family requested that we speak outside of patient's ear shot.  I met with patient's wife and daughter in waiting room of 4 E. to discuss diagnosis prognosis, GOC, EOL wishes, disposition and options.  I introduced Palliative Medicine as specialized medical care for people living with serious illness. It focuses on providing relief from the symptoms and stress of a serious illness. The goal is to improve quality of life for both the patient and the family.  We discussed a brief life review of the patient.  Patient has been married for 59 years and has 2 children, a son and a daughter.  Family endorses that prior to his fall in [place] that patient was able to walk independently and feed himself.  They has noticed a drastic change over the last month which has included several ED visits and hospitalizations.  We discussed  patient's current illness and what it means in the larger context of patient's on-going co-morbidities.  Education provided on dementia as a chronic, progressive, and irreversible disease that is often accelerated and exacerbated by acute illnesses and hospitalizations.  Family shares they have noted a significant decline in his functional and cognitive status after he was treated for rhabdomyolysis and with subsequent falls.  Patient's wife became tearful during our discussion.  Therapeutic silence, active listening, and emotional support provided.  Family is concerned that he do not have the funds to support patient with 24/7 care.  I advised him that [facility] will discuss discharge planning with them.  Secure message sent to [facility] to reach out to patient's wife.  I attempted to elicit values and goals important to patient and family.  Patient's wife shares patient has created a living will that include some of his medical decision making.  She shares patient has always wanted to remain a full code so that he will have the opportunity for his family to come bedside to say goodbye.  Wife is clear that patient would never be accepting of long-term ventilatory support or a trach/PEG.  I requested that family  bring a copy of his living will for our review and to place into his medical record.  Full code remains.  Extensive discussion on the difference between palliative medicine and hospice care versus home health.  Education offered regarding concept specific to human mortality and the limitations of medical interventions to prolong life when the body begins to fail to thrive.  Family is facing treatment option decisions, advanced directive, and anticipatory care needs.    Discussed with patient/family the importance of continued conversation with family and the medical providers regarding overall plan of care and treatment options, ensuring decisions are within the context of the patient's values and GOCs.     Questions and concerns were addressed. The family was encouraged to call with questions or concerns.   PMT will continue to follow.  Primary Decision Maker NEXT OF KIN  Physical Exam Vitals reviewed.  Constitutional:      General: He is not in acute distress.    Appearance: He is normal weight.  HENT:     Head: Normocephalic.     Nose: Nose normal.     Mouth/Throat:     Mouth: Mucous membranes are moist.  Eyes:     Pupils: Pupils are equal, round, and reactive to light.  Cardiovascular:     Rate and Rhythm: Normal rate.  Pulmonary:     Effort: Pulmonary effort is normal.  Abdominal:     Palpations: Abdomen is soft.  Musculoskeletal:     Comments: MAETC  Skin:    General: Skin is warm and dry.     Coloration: Skin is pale.  Neurological:     Mental Status: He is alert.     Comments: Oriented to self  Psychiatric:        Mood and Affect: Mood normal.     Palliative Assessment/Data: 50-60%     Thank you for this consult. Palliative medicine will continue to follow and assist holistically.   Time Total: 75 minutes  Time spent includes: Detailed review of medical records (labs, imaging, vital signs), medically appropriate exam (mental status, respiratory, cardiac, skin), discussed with treatment team, counseling and educating patient, family and staff, documenting clinical information, medication management and coordination of care.  Signed by: Georgiann Cocker, DNP, FNP-BC Palliative Medicine   Please contact Palliative Medicine Team providers via [facility] for questions and concerns.

## 2023-01-16 DIAGNOSIS — Z515 Encounter for palliative care: Secondary | ICD-10-CM | POA: Diagnosis not present

## 2023-01-16 DIAGNOSIS — S2249XA Multiple fractures of ribs, unspecified side, initial encounter for closed fracture: Secondary | ICD-10-CM | POA: Diagnosis not present

## 2023-01-16 DIAGNOSIS — F03918 Unspecified dementia, unspecified severity, with other behavioral disturbance: Secondary | ICD-10-CM | POA: Diagnosis not present

## 2023-01-16 LAB — BASIC METABOLIC PANEL
Anion gap: 6 (ref 5–15)
BUN: 20 mg/dL (ref 8–23)
CO2: 27 mmol/L (ref 22–32)
Calcium: 9.5 mg/dL (ref 8.9–10.3)
Chloride: 107 mmol/L (ref 98–111)
Creatinine, Ser: 0.95 mg/dL (ref 0.61–1.24)
GFR, Estimated: >60 mL/min (ref >60)
Glucose, Bld: 115 mg/dL — ABNORMAL HIGH (ref 70–99)
Potassium: 3.9 mmol/L (ref 3.5–5.1)
Sodium: 140 mmol/L (ref 135–145)

## 2023-01-16 LAB — GLUCOSE, CAPILLARY
Glucose-Capillary: 90 mg/dL (ref 70–99)
Glucose-Capillary: 107 mg/dL — ABNORMAL HIGH (ref 70–99)
Glucose-Capillary: 125 mg/dL — ABNORMAL HIGH (ref 70–99)

## 2023-01-16 LAB — CBC
HCT: 28.8 % — ABNORMAL LOW (ref 39.0–52.0)
Hemoglobin: 9.4 g/dL — ABNORMAL LOW (ref 13.0–17.0)
MCH: 30.7 pg (ref 26.0–34.0)
MCHC: 32.6 g/dL (ref 30.0–36.0)
MCV: 94.1 fL (ref 80.0–100.0)
Platelets: 152 10*3/uL (ref 150–400)
RBC: 3.06 MIL/uL — ABNORMAL LOW (ref 4.22–5.81)
RDW: 13.3 % (ref 11.5–15.5)
WBC: 6.7 10*3/uL (ref 4.0–10.5)
nRBC: 0 % (ref 0.0–0.2)

## 2023-01-16 MED ORDER — DOCUSATE SODIUM 50 MG/5ML PO LIQD
100.0000 mg | Freq: Two times a day (BID) | ORAL | Status: DC
  Administered 2023-01-16 – 2023-01-23 (×10): 100 mg via ORAL
  Filled 2023-01-16 (×11): qty 10

## 2023-01-16 MED ORDER — HYDROXYZINE HCL 25 MG PO TABS
25.0000 mg | ORAL_TABLET | Freq: Three times a day (TID) | ORAL | Status: DC | PRN
  Administered 2023-01-22 (×2): 25 mg via ORAL
  Filled 2023-01-16 (×2): qty 1

## 2023-01-16 NOTE — Progress Notes (Signed)
Received verbal order from PA for putting pt NPO and speech eval.   Lawson Radar, RN

## 2023-01-16 NOTE — Progress Notes (Signed)
SLP Cancellation Note  Patient Details Name: [name] [name] MRN: [id number] DOB: 26-May-1945   Cancelled eval: Orders received for repeat swallowing assessment. Pt continues with waxing and waning ability to swallow safely.  See note 11/14.  Recommend continuing efforts to support PO diet - if pt is unable to eat, hold POs/meds as RN appropriately documented today.    SLP continues to follow while admitted - pt is not likely going to be able to eat enough to meet his needs. Continue efforts to feed but hold tray when he is unable to swallow safely.     Thank you, Harlis Champoux L. Samson Frederic, MA CCC/SLP Clinical Specialist - Acute Care SLP Acute Rehabilitation Services Office number [phone]    [name] 01/16/2023, 3:01 PM

## 2023-01-16 NOTE — Progress Notes (Addendum)
[facility] Progress Note     Subjective: More alert and talkative at time of my visit today. Oriented to self and follows commands but otherwise confused and not oriented. NT at bedside assisting with breakfast Haldol once overnight  Objective: Vital signs in last 24 hours: Temp:  [97.5 F (36.4 C)-98.3 F (36.8 C)] 97.6 F (36.4 C) (11/15 0732) Pulse Rate:  [62-80] 80 (11/15 0732) Resp:  [15-20] 15 (11/15 0732) BP: (111-157)/(58-98) 113/98 (11/15 0732) SpO2:  [96 %-100 %] 100 % (11/15 0732) Last BM Date : 01/12/23  Intake/Output from previous day: 11/14 0701 - 11/15 0700 In: 1010.5 [P.O.:410; I.V.:600.5] Out: 1750 [Urine:1750] Intake/Output this shift: No intake/output data recorded.  PE: Gen: lying in bed, NAD.  Pulm:  Normal effort on 2 lpm [place] Abd: Soft, non-tender, non-distended, Skin: warm and dry, no rashes  Psych: oriented to self. Follows commands  Lab Results:  No results for input(s): "WBC", "HGB", "HCT", "PLT" in the last 72 hours.  BMET No results for input(s): "NA", "K", "CL", "CO2", "GLUCOSE", "BUN", "CREATININE", "CALCIUM" in the last 72 hours.  PT/INR No results for input(s): "LABPROT", "INR" in the last 72 hours.  CMP     Component Value Date/Time   NA 139 01/13/2023 0507   K 3.9 01/13/2023 0507   CL 105 01/13/2023 0507   CO2 27 01/13/2023 0507   GLUCOSE 97 01/13/2023 0507   BUN 19 01/13/2023 0507   CREATININE 0.97 01/13/2023 0507   CALCIUM 9.5 01/13/2023 0507   PROT 6.7 01/09/2023 1651   ALBUMIN 3.2 (L) 01/09/2023 1651   AST 22 01/09/2023 1651   ALT 10 01/09/2023 1651   ALKPHOS 118 01/09/2023 1651   BILITOT 1.0 01/09/2023 1651   GFRNONAA >60 01/13/2023 0507   Lipase  No results found for: "LIPASE"     Studies/Results: No results found.  Anti-infectives: Anti-infectives (From admission, onward)    None        Assessment/Plan  Unwitnessed Fall Possible concussion - TBI therapies L 8-10 rib fxs - IS, pulm  toilet, pain control, muscle relaxers prn OSA - CPAP at night Age indeterminate T3 vertebral endplate deformity - no intervention Small fluid around R kidney - no evidence of injury Soft tissue mass in RLQ, can't rule out carcinoid - outpatient follow up ? Mass in TI/cecum - outpatient work up 2 non solid nodules in LLL - follow up CT outpatient in 3-6 months Dementia/Agitation - namenda, sinemet, and aricept ordered. Held currently with NPO status. Mental status has been waxing and waning. Palliative consult given recent decline in mental status and worsened dysphagia in setting of head injury and palliative consulted. Wife and daughter to visit this pm. Appreciate palliative assistance Increased agitation last evening requiring haldol x1. Added 25mg  seroquel qhs CAD Persistent A fib - on Xarelto at home, on hold for now; hgb stablizing at 9.8 yesterday. Plt stable HTN - home meds re-ordered  HLD Anemia Bipolar disorder - He is on quite a few meds at home Remeron, lamictal, haldol. Dysphagia - SLP cleared for DYS 1. Per RN choking on meals 11/12. Repeat SLP eval 11/13 and recc NPO. Re eval 11/14 and back on dys1  FEN - dys1 VTE - Lovenox, Xarelto on hold ID - none needed Admit - observation, med-surg, therapies.  Given recent falls, plan to hold xarelto and follow up outpatient with cardiology to discuss appropriateness of resumption.     LOS: 7 days   I reviewed nursing notes, Palliative care  notes, last 24 h vitals and pain scores, last 48 h intake and output, last 24 h labs and trends, and last 24 h imaging results.   Eric Form, [facility] 01/16/2023, 7:53 AM Please see Amion for pager number during day hours 7:00am-4:30pm

## 2023-01-16 NOTE — Progress Notes (Signed)
Palliative Care Progress Note, Assessment & Plan   Patient Name: [name] [name]       Date: 01/16/2023 DOB: November 12, 1945  Age: 77 y.o. MRN#: [id number] Attending Physician: Roslynn Amble, MD Primary Care Physician: Merri Brunette, MD Admit Date: 01/09/2023  Subjective: Patient is out of bed and sitting in recliner with mitts in place.  He is asleep.  Sitter is at bedside.  No family or friends present during my visit.  HPI: 77 y.o. male  with past medical history of Alzheimer's dementia, CAD, a fib on Xarelto, HTN, HLD, OSA on CPAP, bipolar disorder, and anemia  admitted on 01/09/2023 with witnessed fall at [facility].   Patient found to have left rib fractures and worsening dysphagia.  SLP consulted.  Patient was made n.p.o. but with re-eval on 11/14 was advanced to dysphagia 1 with thin liquids.   TOC following closely for discharge planning.   PMT was consulted to discuss goals of care.   Summary of counseling/coordination of care: Extensive chart review completed prior to meeting patient including labs, vital signs, imaging, progress notes, orders, and available advanced directive documents from current and previous encounters.   After reviewing the patient's chart and assessing the patient at bedside, I spoke with patient's wife over the phone.  We discussed symptom management and plan of care.  Discussed that patient's mentation and ability to swallow waxes and wanes.  This is a side effect of his progressive, irreversible, and chronic dementia.  [name] [name] states that she knows that this is not going to go well but she is just hopeful that she can get him into a facility that is safe.  She is spoken with TOC.  I assured her there following closely for discharge planning.  I again discussed advance  care planning and CODE STATUS.  [name] [name] continues to endorse full code and full scope with the understanding the patient would never want to be kept alive long-term on machines (either a ventilator or PEG).  I let [name] [name] know that PMT will be stepping back but remain available to patient and family throughout his hospitalization.  Please reengage with PMT if goals change, at patient/family's request, or if patient's health deteriorates during hospitalization.  Physical Exam Vitals reviewed.  Constitutional:      General: He is not in acute distress. HENT:     Head: Normocephalic.     Mouth/Throat:     Mouth: Mucous membranes are moist.  Pulmonary:     Effort: Pulmonary effort is normal.  Abdominal:     Palpations: Abdomen is soft.  Musculoskeletal:     Comments: MAETC  Skin:    General: Skin is warm and dry.  Neurological:     Mental Status: He is alert.  Psychiatric:        Behavior: Behavior normal.             Total Time 25 minutes   Time spent includes: Detailed review of medical records (labs, imaging, vital signs), medically appropriate exam (mental status, respiratory, cardiac, skin), discussed with treatment team, counseling and educating patient, family and staff, documenting clinical information, medication management and coordination of care.  Samara Deist L. Bonita Quin, DNP, FNP-BC Palliative Medicine  Team

## 2023-01-16 NOTE — TOC Progression Note (Addendum)
Transition of Care [facility]) - Progression Note    Patient Details  Name: MELCHIZEDEK LEACHMAN MRN: [id number] Date of Birth: Apr 12, 1945  Transition of Care [facility]) CM/SW Contact  Glennon Mac, RN Phone Number: 01/16/2023, 1010am  Clinical Narrative:    Attempted to meet with wife and daughter yesterday after palliative meeting, but they had to leave due to a doctor's appt.  Left message with daughter Atreus Jepson in an attempt to discuss discharge planning/answer questions.    1616 Addendum:  Received call back from [employer]; she asks that I call her mother, [name]. She is upset that patient is not getting well enough to return to facility, and that he is not swallowing again.  She states that family is paying for bed hold at [facility], and they have been happy with the care there.  We reviewed the difference between SNF, ALF, and Memory Care, and how these are paid for.  She states that they are working with [facility] to help get patient Medicaid, as he may need long term SNF.  [name] is sad about patient condition, and states she would like an update from MD.  MD: please call patient's wife Jettson Blais at [phone] on Saturday, if able.  Wife appreciative of my call.     Expected Discharge Plan: Skilled Nursing Facility Barriers to Discharge: Continued Medical Work up  Expected Discharge Plan and Services In-house Referral: Clinical Social Work   Post Acute Care Choice: Skilled Nursing Facility Living arrangements for the past 2 months: Skilled Nursing Facility                                       Social Determinants of Health (SDOH) Interventions    Readmission Risk Interventions     No data to display         Quintella Baton, RN, BSN  Trauma/Neuro ICU Case Manager [phone]

## 2023-01-16 NOTE — Progress Notes (Signed)
Pt appeared fine after crushed med in pudding administered. 15 min later after administration. Pt's face turned redness and coughing. Pt remained sleepy. Able to arouse but only opened eyes for 3 min. PA notified. Hold off for all meds and food for right now.   Lawson Radar, RN

## 2023-01-17 LAB — GLUCOSE, CAPILLARY
Glucose-Capillary: 105 mg/dL — ABNORMAL HIGH (ref 70–99)
Glucose-Capillary: 87 mg/dL (ref 70–99)
Glucose-Capillary: 103 mg/dL — ABNORMAL HIGH (ref 70–99)
Glucose-Capillary: 121 mg/dL — ABNORMAL HIGH (ref 70–99)
Glucose-Capillary: 113 mg/dL — ABNORMAL HIGH (ref 70–99)

## 2023-01-17 NOTE — Progress Notes (Signed)
Hospital unit at bedside. Patient not tolerating at this time.

## 2023-01-17 NOTE — Progress Notes (Signed)
[facility] Progress Note     Subjective: Patient sleeping this morning.  No family present.  Sitter present  Objective: Vital signs in last 24 hours: Temp:  [97.8 F (36.6 C)-98.4 F (36.9 C)] 98.4 F (36.9 C) (11/16 0557) Pulse Rate:  [60-81] 64 (11/16 0557) Resp:  [16-20] 18 (11/16 0557) BP: (105-153)/(52-73) 120/70 (11/16 0557) SpO2:  [96 %-100 %] 97 % (11/16 0151) Last BM Date : 01/12/23  Intake/Output from previous day: 11/15 0701 - 11/16 0700 In: 100 [P.O.:100] Out: 950 [Urine:950] Intake/Output this shift: No intake/output data recorded.  PE: Gen: lying in bed, NAD.  Pulm:  Normal effort Abd: Soft, non-distended, Skin: warm and dry, no rashes  Psych: sleeping  Lab Results:  Recent Labs    01/16/23 1152  WBC 6.7  HGB 9.4*  HCT 28.8*  PLT 152    BMET Recent Labs    01/16/23 1152  NA 140  K 3.9  CL 107  CO2 27  GLUCOSE 115*  BUN 20  CREATININE 0.95  CALCIUM 9.5    PT/INR No results for input(s): "LABPROT", "INR" in the last 72 hours.  CMP     Component Value Date/Time   NA 140 01/16/2023 1152   K 3.9 01/16/2023 1152   CL 107 01/16/2023 1152   CO2 27 01/16/2023 1152   GLUCOSE 115 (H) 01/16/2023 1152   BUN 20 01/16/2023 1152   CREATININE 0.95 01/16/2023 1152   CALCIUM 9.5 01/16/2023 1152   PROT 6.7 01/09/2023 1651   ALBUMIN 3.2 (L) 01/09/2023 1651   AST 22 01/09/2023 1651   ALT 10 01/09/2023 1651   ALKPHOS 118 01/09/2023 1651   BILITOT 1.0 01/09/2023 1651   GFRNONAA >60 01/16/2023 1152   Lipase  No results found for: "LIPASE"     Studies/Results: No results found.  Anti-infectives: Anti-infectives (From admission, onward)    None        Assessment/Plan  Unwitnessed Fall Possible concussion - TBI therapies L 8-10 rib fxs - IS, pulm toilet, pain control, muscle relaxers prn OSA - CPAP at night Age indeterminate T3 vertebral endplate deformity - no intervention Small fluid around R kidney - no evidence  of injury Soft tissue mass in RLQ, can't rule out carcinoid - outpatient follow up ? Mass in TI/cecum - outpatient work up 2 non solid nodules in LLL - follow up CT outpatient in 3-6 months Dementia/Agitation - namenda, sinemet, and aricept ordered. Mental status has been waxing and waning. Palliative consult given recent decline in mental status and worsened dysphagia in setting of head injury and palliative consulted. Wife apparently would like to continue full code and full measures. Increased agitation last evening requiring haldol x1. Added 25mg  seroquel qhs CAD Persistent A fib - on Xarelto at home, on hold for now; hgb stablizing at 9.4. Plt normalize HTN - home meds re-ordered  HLD Anemia Bipolar disorder - He is on quite a few meds at home Remeron, lamictal, haldol. Dysphagia - SLP cleared for DYS 1. Per RN choking on meals 11/12. Repeat SLP eval 11/13 and recc NPO. Re eval 11/14 and back on dys1  FEN - dys1 VTE - Lovenox, Xarelto on hold ID - none needed Admit - Given recent falls, plan to hold xarelto and follow up outpatient with cardiology to discuss appropriateness of resumption. In patient, awaiting determination of next venue.    LOS: 8 days   I reviewed nursing notes, Palliative care notes, last 24 h vitals and pain scores,  last 48 h intake and output, last 24 h labs and trends, and last 24 h imaging results.   Letha Cape, [facility] Surgery 01/17/2023, 9:43 AM Please see Amion for pager number during day hours 7:00am-4:30pm

## 2023-01-17 NOTE — Progress Notes (Signed)
Resume pt's diet back to dys 1. The diet would be addressed back on Monday per PA.   Lawson Radar, RN

## 2023-01-17 NOTE — Progress Notes (Signed)
Administered pt's crushed meds this am. After 30 min, pt coughed non stopped. Suctioned the pt. Hold meds from now and put pt NPO. Notified SP and trauma PA.   Lawson Radar, RN

## 2023-01-18 LAB — GLUCOSE, CAPILLARY
Glucose-Capillary: 95 mg/dL (ref 70–99)
Glucose-Capillary: 101 mg/dL — ABNORMAL HIGH (ref 70–99)
Glucose-Capillary: 137 mg/dL — ABNORMAL HIGH (ref 70–99)
Glucose-Capillary: 119 mg/dL — ABNORMAL HIGH (ref 70–99)

## 2023-01-18 NOTE — Progress Notes (Signed)
[facility] Progress Note     Subjective: Awake and very pleasant this am.  Sitter at bedside states he did very well yesterday afternoon with his D1 diet.  Objective: Vital signs in last 24 hours: Temp:  [98 F (36.7 C)-98.3 F (36.8 C)] 98.2 F (36.8 C) (11/16 2311) Pulse Rate:  [59-97] 97 (11/16 2311) Resp:  [17-21] 17 (11/16 2311) BP: (107-155)/(54-86) 155/86 (11/16 2311) SpO2:  [94 %-99 %] 98 % (11/16 2311) Last BM Date : 01/12/23  Intake/Output from previous day: 11/16 0701 - 11/17 0700 In: 300 [P.O.:300] Out: 400 [Urine:400] Intake/Output this shift: No intake/output data recorded.  PE: Gen: lying in bed, NAD.  Pulm:  Normal effort Abd: Soft, non-distended, Skin: warm and dry, no rashes  Psych: awake and alert  Lab Results:  Recent Labs    01/16/23 1152  WBC 6.7  HGB 9.4*  HCT 28.8*  PLT 152    BMET Recent Labs    01/16/23 1152  NA 140  K 3.9  CL 107  CO2 27  GLUCOSE 115*  BUN 20  CREATININE 0.95  CALCIUM 9.5    PT/INR No results for input(s): "LABPROT", "INR" in the last 72 hours.  CMP     Component Value Date/Time   NA 140 01/16/2023 1152   K 3.9 01/16/2023 1152   CL 107 01/16/2023 1152   CO2 27 01/16/2023 1152   GLUCOSE 115 (H) 01/16/2023 1152   BUN 20 01/16/2023 1152   CREATININE 0.95 01/16/2023 1152   CALCIUM 9.5 01/16/2023 1152   PROT 6.7 01/09/2023 1651   ALBUMIN 3.2 (L) 01/09/2023 1651   AST 22 01/09/2023 1651   ALT 10 01/09/2023 1651   ALKPHOS 118 01/09/2023 1651   BILITOT 1.0 01/09/2023 1651   GFRNONAA >60 01/16/2023 1152   Lipase  No results found for: "LIPASE"     Studies/Results: No results found.  Anti-infectives: Anti-infectives (From admission, onward)    None        Assessment/Plan  Unwitnessed Fall Possible concussion - TBI therapies L 8-10 rib fxs - IS, pulm toilet, pain control, muscle relaxers prn OSA - CPAP at night Age indeterminate T3 vertebral endplate deformity - no  intervention Small fluid around R kidney - no evidence of injury Soft tissue mass in RLQ, can't rule out carcinoid - outpatient follow up ? Mass in TI/cecum - outpatient work up 2 non solid nodules in LLL - follow up CT outpatient in 3-6 months Dementia/Agitation - namenda, sinemet, and aricept ordered. Mental status has been waxing and waning. Palliative consult given recent decline in mental status and worsened dysphagia in setting of head injury and palliative consulted. Wife apparently would like to continue full code and full measures. Increased agitation last evening requiring haldol x1. Added 25mg  seroquel qhs CAD Persistent A fib - on Xarelto at home, on hold for now; hgb stablizing at 9.4. Plt normalize HTN - home meds re-ordered  HLD Anemia Bipolar disorder - He is on quite a few meds at home Remeron, lamictal, haldol. Dysphagia - SLP cleared for DYS 1. Per RN choking on meals 11/12. Repeat SLP eval 11/13 and recc NPO. Re eval 11/14 and back on dys1.  Will leave on D1 diet at this time.  When he is awake he tolerates this well.  When he is more sleepy he does not.  Will have sitter/staff feed patient when he is awake and able to tolerate his POs, but if he is more sedated etc, then  just hold on feeding at that time.  Suspect this is his baseline.  FEN - dys1 VTE - Lovenox, Xarelto on hold ID - none needed Admit - Given recent falls, plan to hold xarelto and follow up outpatient with cardiology to discuss appropriateness of resumption. In patient, awaiting determination of next venue.    LOS: 9 days   I reviewed nursing notes, last 24 h vitals and pain scores, last 48 h intake and output, last 24 h labs and trends, and last 24 h imaging results.   Letha Cape, [facility] Surgery 01/18/2023, 8:03 AM Please see Amion for pager number during day hours 7:00am-4:30pm

## 2023-01-19 ENCOUNTER — Ambulatory Visit: Payer: Medicare PPO

## 2023-01-19 DIAGNOSIS — R531 Weakness: Secondary | ICD-10-CM

## 2023-01-19 DIAGNOSIS — Z515 Encounter for palliative care: Secondary | ICD-10-CM | POA: Diagnosis not present

## 2023-01-19 DIAGNOSIS — F03918 Unspecified dementia, unspecified severity, with other behavioral disturbance: Secondary | ICD-10-CM

## 2023-01-19 LAB — GLUCOSE, CAPILLARY
Glucose-Capillary: 77 mg/dL (ref 70–99)
Glucose-Capillary: 114 mg/dL — ABNORMAL HIGH (ref 70–99)
Glucose-Capillary: 108 mg/dL — ABNORMAL HIGH (ref 70–99)

## 2023-01-19 NOTE — Progress Notes (Signed)
[facility] Progress Note     Subjective: Awake and very pleasant this am. Daughter at bedside and hoping to speak with someone about discharge planning.   Objective: Vital signs in last 24 hours: Temp:  [97.3 F (36.3 C)-98.8 F (37.1 C)] 97.3 F (36.3 C) (11/18 1150) Pulse Rate:  [46-69] 69 (11/18 1150) Resp:  [18-20] 20 (11/18 1150) BP: (124-164)/(67-98) 128/67 (11/18 1150) SpO2:  [97 %-100 %] 97 % (11/18 1150) Last BM Date : 01/18/23  Intake/Output from previous day: 11/17 0701 - 11/18 0700 In: 600 [P.O.:600] Out: 1150 [Urine:1150] Intake/Output this shift: No intake/output data recorded.  PE: Gen: lying in bed, NAD.  Pulm:  Normal effort Abd: Soft, non-distended, Skin: warm and dry, no rashes  Psych: awake and alert  Lab Results:  No results for input(s): "WBC", "HGB", "HCT", "PLT" in the last 72 hours.   BMET No results for input(s): "NA", "K", "CL", "CO2", "GLUCOSE", "BUN", "CREATININE", "CALCIUM" in the last 72 hours.   PT/INR No results for input(s): "LABPROT", "INR" in the last 72 hours.  CMP     Component Value Date/Time   NA 140 01/16/2023 1152   K 3.9 01/16/2023 1152   CL 107 01/16/2023 1152   CO2 27 01/16/2023 1152   GLUCOSE 115 (H) 01/16/2023 1152   BUN 20 01/16/2023 1152   CREATININE 0.95 01/16/2023 1152   CALCIUM 9.5 01/16/2023 1152   PROT 6.7 01/09/2023 1651   ALBUMIN 3.2 (L) 01/09/2023 1651   AST 22 01/09/2023 1651   ALT 10 01/09/2023 1651   ALKPHOS 118 01/09/2023 1651   BILITOT 1.0 01/09/2023 1651   GFRNONAA >60 01/16/2023 1152   Lipase  No results found for: "LIPASE"     Studies/Results: No results found.  Anti-infectives: Anti-infectives (From admission, onward)    None        Assessment/Plan  Unwitnessed Fall Possible concussion - TBI therapies L 8-10 rib fxs - IS, pulm toilet, pain control, muscle relaxers prn OSA - CPAP at night Age indeterminate T3 vertebral endplate deformity - no  intervention Small fluid around R kidney - no evidence of injury Soft tissue mass in RLQ, can't rule out carcinoid - outpatient follow up ? Mass in TI/cecum - outpatient work up 2 non solid nodules in LLL - follow up CT outpatient in 3-6 months Dementia/Agitation - namenda, sinemet, and aricept ordered. Mental status has been waxing and waning. Palliative consult given recent decline in mental status and worsened dysphagia in setting of head injury and palliative consulted. Wife apparently would like to continue full code and full measures. Continue nightly seroquel.  CAD Persistent A fib - on Xarelto at home, on hold for now; hgb stablizing at 9.4 on 11/15. Plt normalize HTN - home meds re-ordered  HLD Anemia Bipolar disorder - He is on quite a few meds at home Remeron, lamictal, haldol. Dysphagia - SLP cleared for DYS 1, ate well today and ate entire lunch tray per daughter   FEN - dys1 VTE - Lovenox, Xarelto on hold ID - none needed Admit - Given recent falls, plan to hold xarelto and follow up outpatient with cardiology to discuss appropriateness of resumption. TOC following, current recs are for SNF but family have some concerns with him returning to current SNF. Medically stable for DC.     LOS: 10 days   I reviewed nursing notes, last 24 h vitals and pain scores, last 48 h intake and output, last 24 h labs and trends, and  last 24 h imaging results.   Juliet Rude, [facility] 01/19/2023, 3:16 PM Please see Amion for pager number during day hours 7:00am-4:30pm

## 2023-01-19 NOTE — Progress Notes (Signed)
Patient ID: [name] [name], male   DOB: [birth year], 77 y.o.   MRN: [id number]    Progress Note from the Palliative Medicine Team at [facility]   Patient Name: [name] [name]        Date: 01/19/2023 DOB: Nov 08, 1945  Age: 77 y.o. MRN#: [id number] Attending Physician: Roslynn Amble, MD Primary Care Physician: Merri Brunette, MD Admit Date: 01/09/2023   Reason for Consultation/Follow-up   Establishing Goals of Care   HPI/ Brief Hospital Review 77 y.o. male  with past medical history of Alzheimer's dementia, CAD, a fib on Xarelto, HTN, HLD, OSA on CPAP, bipolar disorder, and anemia  admitted on 01/09/2023 with witnessed fall at [facility].   Patient found to have left rib fractures and worsening dysphagia.  SLP consulted.  Patient was made n.p.o. but with re-eval on 11/14 was advanced to dysphagia 1 with thin liquids.   TOC following closely for discharge planning. Today is day 10 of this hospitalization.   PMT was consulted to discuss goals of care.  Family face treatment option decisions, advanced directive decisions and anticipatory care needs.     Subjective  Extensive chart review has been completed prior to meeting with patient/family  including labs, vital signs, imaging, progress/consult notes, orders, medications and available advance directive documents.   This NP assessed patient at the bedside, his is confused/ with hands mitts.   He does not have medical decision making capacity.   No family at bedside.   I spoke to daughter by telephone, family is requesting conversation regarding ACP and guidance for transition of care.      Family  has lots of questions and concerns regarding treatment plan.   Briefly we discussed disposition options.  Plan is to met in person tomorrow with family at 3:30 .  Daughter tells me there are ACP documents.  She will bring them in tomorrow  Education offered today regarding  the importance of continued conversation with family and  their  medical providers regarding overall plan of care and treatment options,  ensuring decisions are within the context of the patients values and GOCs.  Questions and concerns addressed     Plan is to meet tomorrow at 3:30 for family meeting    Time:   25 minutes  Detailed review of medical records ( labs, imaging, vital signs), medically appropriate exam ( MS, skin, cardiac,  resp)   discussed with treatment team, counseling and education to patient, family, staff, documenting clinical information, medication management, coordination of care    Lorinda Creed NP  Palliative Medicine Team Team Phone # [phone] Pager [phone]

## 2023-01-19 NOTE — Progress Notes (Signed)
   01/19/23 2054  BiPAP/CPAP/SIPAP  Reason BIPAP/CPAP not in use Non-compliant

## 2023-01-19 NOTE — Progress Notes (Signed)
Speech Language Pathology Treatment: Dysphagia  Patient Details Name: [name] [name] MRN: [id number] DOB: 04-27-1945 Today's Date: 01/19/2023 Time: 2951-8841 SLP Time Calculation (min) (ACUTE ONLY): 28 min  Assessment / Plan / Recommendation Clinical Impression  [name] [name] was sleeping but easily roused and accepted sips of thin liquid without deficit. He held the cup and drank from a straw, consuming 4 oz of apple juice with normal oral control and brisk swallow response; there were no s/s of aspiration. He promptly fell back to sleep. His daughter, [name] [name], was at the bedside. We contrasted his normal swallowing of the juice with behaviors he also exhibits - oral holding, swishing, with delayed swallow onset and significant coughing with concern for aspiration. She verbalized understanding of the waxing and waning of her father's swallowing.  We discussed these extreme fluctuations in swallowing ability as being consistent with the dementia process. She asked when he would need a feeding tube. We reviewed the contraindications of feeding tubes with dementia.    Recommend that we continue dysphagia 1/thin liquids with careful hand-feeding. Hold tray when [name] [name] is uncomfortable and coughing or when he is not alert.  Offer tray with he is interactive and shows an interest in eating. [name] [name] agrees with this recommendation. SLP will follow.   HPI HPI: Pt is a 77 yo male admitted 11/8 for unwitnessed fall from [facility], CT scan 11/8 no signs of cervical fx, acute fx L ribs 8-10. PMH: Alzheimer's dementia, CAD, Afib on Xarelto, HTN, HLD, OSA on CPAP, bipolar disorder, and anemia      SLP Plan  Continue with current plan of care      Recommendations for follow up therapy are one component of a multi-disciplinary discharge planning process, led by the attending physician.  Recommendations may be updated based on patient status, additional functional criteria and insurance  authorization.    Recommendations  Diet recommendations: Dysphagia 1 (puree);Thin liquid Liquids provided via: Cup;Straw Medication Administration: Crushed with puree Supervision: Patient able to self feed;Full supervision/cueing for compensatory strategies;Trained caregiver to feed patient Compensations: Minimize environmental distractions                  Oral care BID   Frequent or constant Supervision/Assistance Dysphagia, oropharyngeal phase (R13.12)     Continue with current plan of care    [name] [name] L. [name] Frederic, MA CCC/SLP Clinical Specialist - Acute Care SLP Acute Rehabilitation Services Office number [phone]  [name] [name] [name] [name]  01/19/2023, 1:27 PM

## 2023-01-19 NOTE — Progress Notes (Signed)
   01/19/23 0003  BiPAP/CPAP/SIPAP  Reason BIPAP/CPAP not in use Non-compliant

## 2023-01-20 DIAGNOSIS — F03918 Unspecified dementia, unspecified severity, with other behavioral disturbance: Secondary | ICD-10-CM | POA: Diagnosis not present

## 2023-01-20 DIAGNOSIS — Z515 Encounter for palliative care: Secondary | ICD-10-CM | POA: Diagnosis not present

## 2023-01-20 DIAGNOSIS — R531 Weakness: Secondary | ICD-10-CM | POA: Diagnosis not present

## 2023-01-20 DIAGNOSIS — Z7189 Other specified counseling: Secondary | ICD-10-CM | POA: Diagnosis not present

## 2023-01-20 LAB — GLUCOSE, CAPILLARY
Glucose-Capillary: 94 mg/dL (ref 70–99)
Glucose-Capillary: 95 mg/dL (ref 70–99)
Glucose-Capillary: 168 mg/dL — ABNORMAL HIGH (ref 70–99)

## 2023-01-20 NOTE — Progress Notes (Signed)
Patient ID: [name] [name], male   DOB: 19-Oct-1945, 77 y.o.   MRN: [id number]    Progress Note from the Palliative Medicine Team at [facility]   Patient Name: [name] [name]        Date: 01/20/2023 DOB: 02-Jan-1946  Age: 77 y.o. MRN#: [id number] Attending Physician: [name] Amble, MD Primary Care Physician: [name] Brunette, MD Admit Date: 01/09/2023   Reason for Consultation/Follow-up   Establishing Goals of Care   HPI/ Brief Hospital Review 77 y.o. male  with past medical history of Alzheimer's dementia, CAD, a fib on Xarelto, HTN, HLD, OSA on CPAP, bipolar disorder, and anemia  admitted on 01/09/2023 with witnessed fall at [facility].   Patient found to have left rib fractures and worsening dysphagia.  SLP consulted.  Patient was made n.p.o. but with re-eval on 11/14 was advanced to dysphagia 1 with thin liquids.   TOC following closely for discharge planning. Today is day 11 of this hospitalization.   PMT was consulted to discuss goals of care.  Family face ongoing treatment option decisions, advanced directive decisions and anticipatory care needs.     Subjective  Extensive chart review has been completed prior to meeting with patient/family  including labs, vital signs, imaging, progress/consult notes, orders, medications and available advance directive documents.   This NP assessed patient at the bedside, his is intermittently confused, nursing has helped him out of bed to the chair. He does not have medical decision making capacity at this time.    I met with both patient's wife and his daughter for ongoing conversation regarding current medical situation.  We reviewed ACP documents, he has a Dietitian for a Natural Death--education offered on the details of that document. Education offered on the natural trajectory and expectations of dementia, education offered on adult failure to thrive and the limitations of medica interventions to prolong quality of life when a  body fails to thrive.  MOST form introduced and reviewed. Detailed education offered on concepts specific to artificial feeding and hydration; PEG tubes are not recommended in patient with dementia.  Education offered on the difference between a full medical support path and a comfort  path.  Education offered on hospice benefit: philosophy and eligibility, and how to self refer.  Plan of care: -Full code    - Educated patient/family to consider DNR/DNI status understanding evidenced based poor outcomes in similar hospitalized patient, as the cause of arrest is likely associated with advanced chronic illness rather than an easily reversible acute cardio-pulmonary event. -continue to treat the treatable and hope for improvement.  -Transition to SNF for short term rehab -Recommend OP Palliative to follow at [facility]  Education offered today regarding  the importance of continued conversation with family and their  medical providers regarding overall plan of care and treatment options,    Ensuring decisions are in the context of patient's values and GOC Hard Choices left for review.  Questions and concerns addressed        Time:   65 minutes  Discussed with TOC/Julie  Detailed review of medical records ( labs, imaging, vital signs), medically appropriate exam ( MS, skin, cardiac,  resp)   discussed with treatment team, counseling and education to patient, family, staff, documenting clinical information, medication management, coordination of care    [name] Creed NP  Palliative Medicine Team Team Phone # [phone] Pager [phone]

## 2023-01-20 NOTE — Progress Notes (Signed)
Physical Therapy Treatment Patient Details Name: [name] [name] MRN: [id number] DOB: [birth year] Today's Date: 01/20/2023   History of Present Illness Pt is a 77 yo male admitted 11/8 for unwitnessed fall from [facility], CT scan 11/8 no signs of cervical fx, acute fx L ribs 8-10. PMH: Alzheimer's dementia, CAD, Afib on Xarelto, HTN, HLD, OSA on CPAP, bipolar disorder, and anemia    PT Comments  Pt remains lethargic on arrival needing cues and stimulus to arouse and assist to initiate movement. Pt with increased Rt knee pain limiting stance and gait this session. Pt reports no chest pain but progressively increased knee flexion in standing needing chair pulled to him for safety and sitting. Will continue to follow. Pt in chair with alarm and tele sitter.      If plan is discharge home, recommend the following: Two people to help with walking and/or transfers;Two people to help with bathing/dressing/bathroom;Assistance with cooking/housework;Assist for transportation;Help with stairs or ramp for entrance;Supervision due to cognitive status   Can travel by private vehicle        Equipment Recommendations  None recommended by PT    Recommendations for Other Services       Precautions / Restrictions Precautions Precautions: Fall     Mobility  Bed Mobility Overal bed mobility: Needs Assistance Bed Mobility: Supine to Sit Rolling: Min assist         General bed mobility comments: max cues and facilitation with min physical assist to swing legs to EOB and elevate trunk to sitting    Transfers Overall transfer level: Needs assistance   Transfers: Sit to/from Stand Sit to Stand: Min assist           General transfer comment: increased time and cues to rise to standing with pt with increased trunk and rt knee flexion with left lean in standing    Ambulation/Gait Ambulation/Gait assistance: Mod assist Gait Distance (Feet): 100 Feet Assistive device: 1 person hand  held assist Gait Pattern/deviations: Decreased step length - left, Decreased step length - right, Decreased stride length, Knee flexed in stance - left, Knee flexed in stance - right, Shuffle, Drifts right/left, Narrow base of support   Gait velocity interpretation: <1.8 ft/sec, indicate of risk for recurrent falls   General Gait Details: HHA with left lean and increased physical assist. pt with increased rt knee flexion in stance this session needing cues and assist for balance and progression of gait   Stairs             Wheelchair Mobility     Tilt Bed    Modified Rankin (Stroke Patients Only)       Balance Overall balance assessment: Needs assistance Sitting-balance support: No upper extremity supported, Feet supported, Single extremity supported Sitting balance-Leahy Scale: Poor Sitting balance - Comments: CGA EOB   Standing balance support: Single extremity supported, During functional activity Standing balance-Leahy Scale: Poor Standing balance comment: UB support in standing with left lean                            Cognition Arousal: Lethargic Behavior During Therapy: Flat affect Overall Cognitive Status: History of cognitive impairments - at baseline                                 General Comments: lethargic and needing cues to maintain attention  Exercises General Exercises - Lower Extremity Long Arc Quad: Seated, Both, 5 reps, AROM    General Comments        Pertinent Vitals/Pain Pain Assessment Faces Pain Scale: Hurts little more Pain Location: rt knee    Home Living                          Prior Function            PT Goals (current goals can now be found in the care plan section) Progress towards PT goals: Progressing toward goals    Frequency    Min 1X/week      PT Plan      Co-evaluation              AM-PAC PT "6 Clicks" Mobility   Outcome Measure  Help needed turning  from your back to your side while in a flat bed without using bedrails?: A Little Help needed moving from lying on your back to sitting on the side of a flat bed without using bedrails?: A Little Help needed moving to and from a bed to a chair (including a wheelchair)?: A Lot Help needed standing up from a chair using your arms (e.g., wheelchair or bedside chair)?: A Lot Help needed to walk in hospital room?: A Lot Help needed climbing 3-5 steps with a railing? : Total 6 Click Score: 13    End of Session Equipment Utilized During Treatment: Gait belt Activity Tolerance: Patient tolerated treatment well Patient left: in chair;with call bell/phone within reach;with chair alarm set Nurse Communication: Mobility status PT Visit Diagnosis: Unsteadiness on feet (R26.81);Other abnormalities of gait and mobility (R26.89);Repeated falls (R29.6);History of falling (Z91.81);Muscle weakness (generalized) (M62.81);Difficulty in walking, not elsewhere classified (R26.2)     Time: 3086-5784 PT Time Calculation (min) (ACUTE ONLY): 23 min  Charges:    $Gait Training: 8-22 mins $Therapeutic Activity: 8-22 mins PT General Charges $$ ACUTE PT VISIT: 1 Visit                     Merryl Hacker, PT Acute Rehabilitation Services Office: [phone]    Enedina Finner Viktoria Gruetzmacher 01/20/2023, 9:12 AM

## 2023-01-20 NOTE — TOC Progression Note (Signed)
Transition of Care [facility]) - Progression Note    Patient Details  Name: [name] [name] MRN: [id number] Date of Birth: 07/29/1945  Transition of Care [facility] CM/SW Contact  Glennon Mac, RN Phone Number: 01/20/2023, 11:30am  Clinical Narrative:    Sherron Monday with Kia at [facility] regarding bed availability at facility; she states currently no male beds available.  She states family has not held the bed, but they will take him back when male bed available.   1230pm Addendum Met with wife, daughter, and [name] from palliative care team to discuss dc planning and goals of care: wife and daughter are interested in having patient return to [facility], and would like for him to continue rehab.  Note patient ambulated 100 feet with PT today.  They are hopeful for improvement with rehab, but understand that Hospice services are available when needed.  Wife states she is "torn" about making patient a DNR, and advanced directives were reviewed.   I have confirmed that [facility] will take patient back to their facility upon availability of a male bed.  Will initiate insurance authoriztion likely tomorrow as no male bed available today. Will follow progress and update family accordingly.     Expected Discharge Plan: Skilled Nursing Facility Barriers to Discharge: Continued Medical Work up  Expected Discharge Plan and Services In-house Referral: Clinical Social Work   Post Acute Care Choice: Skilled Nursing Facility Living arrangements for the past 2 months: Skilled Nursing Facility                                       Social Determinants of Health (SDOH) Interventions SDOH Screenings   Food Insecurity: Patient Unable To Answer (01/20/2023)  Housing: Patient Unable To Answer (01/20/2023)  Transportation Needs: Patient Unable To Answer (01/20/2023)  Utilities: Patient Unable To Answer (01/20/2023)    Readmission Risk Interventions     No data to display          Quintella Baton, RN, BSN  Trauma/Neuro ICU Case Manager [phone]

## 2023-01-20 NOTE — Progress Notes (Signed)
   01/20/23 2251  BiPAP/CPAP/SIPAP  $ Non-Invasive Home Ventilator  Subsequent  BiPAP/CPAP/SIPAP Pt Type Adult  BiPAP/CPAP/SIPAP DREAMSTATIOND  Reason BIPAP/CPAP not in use Non-compliant (pt states  will try on cpap tomorrow but not tonight)

## 2023-01-20 NOTE — Progress Notes (Signed)
[facility] Progress Note     Subjective: Resting comfortably. Denies pain or other complaint this AM. Family planning on having GOC today at 3:30 per palliative note.   Objective: Vital signs in last 24 hours: Temp:  [97.3 F (36.3 C)-98.2 F (36.8 C)] 97.6 F (36.4 C) (11/19 0716) Pulse Rate:  [62-78] 68 (11/19 0752) Resp:  [17-20] 17 (11/19 0330) BP: (115-166)/(65-81) 160/81 (11/19 0752) SpO2:  [94 %-100 %] 94 % (11/19 0330) Last BM Date : 01/18/23  Intake/Output from previous day: 11/18 0701 - 11/19 0700 In: 280 [P.O.:280] Out: 650 [Urine:650] Intake/Output this shift: No intake/output data recorded.  PE: Gen: lying in bed, NAD.  Pulm:  Normal effort Abd: Soft, non-distended, Skin: warm and dry, no rashes  Psych: awake and alert  Lab Results:  No results for input(s): "WBC", "HGB", "HCT", "PLT" in the last 72 hours.   BMET No results for input(s): "NA", "K", "CL", "CO2", "GLUCOSE", "BUN", "CREATININE", "CALCIUM" in the last 72 hours.   PT/INR No results for input(s): "LABPROT", "INR" in the last 72 hours.  CMP     Component Value Date/Time   NA 140 01/16/2023 1152   K 3.9 01/16/2023 1152   CL 107 01/16/2023 1152   CO2 27 01/16/2023 1152   GLUCOSE 115 (H) 01/16/2023 1152   BUN 20 01/16/2023 1152   CREATININE 0.95 01/16/2023 1152   CALCIUM 9.5 01/16/2023 1152   PROT 6.7 01/09/2023 1651   ALBUMIN 3.2 (L) 01/09/2023 1651   AST 22 01/09/2023 1651   ALT 10 01/09/2023 1651   ALKPHOS 118 01/09/2023 1651   BILITOT 1.0 01/09/2023 1651   GFRNONAA >60 01/16/2023 1152   Lipase  No results found for: "LIPASE"     Studies/Results: No results found.  Anti-infectives: Anti-infectives (From admission, onward)    None        Assessment/Plan  Unwitnessed Fall Possible concussion - TBI therapies L 8-10 rib fxs - IS, pulm toilet, pain control, muscle relaxers prn OSA - CPAP at night Age indeterminate T3 vertebral endplate deformity - no  intervention Small fluid around R kidney - no evidence of injury Soft tissue mass in RLQ, can't rule out carcinoid - outpatient follow up ? Mass in TI/cecum - outpatient work up 2 non solid nodules in LLL - follow up CT outpatient in 3-6 months Dementia/Agitation - namenda, sinemet, and aricept ordered. Mental status has been waxing and waning. Palliative consult given recent decline in mental status and worsened dysphagia in setting of head injury and palliative consulted. Wife apparently would like to continue full code and full measures. Continue nightly seroquel.  CAD Persistent A fib - on Xarelto at home, on hold for now; hgb stablizing at 9.4 on 11/15. Plt normalize HTN - home meds re-ordered  HLD Anemia Bipolar disorder - He is on quite a few meds at home Remeron, lamictal, haldol. Dysphagia - SLP cleared for DYS 1, eating well when rested and was able to feed himself some of his meals yesterday  FEN - dys1 VTE - Lovenox, Xarelto on hold ID - none needed Admit - Given recent falls, plan to hold xarelto and follow up outpatient with cardiology to discuss appropriateness of resumption. TOC following, current recs are for SNF but family have some concerns with him returning to current SNF. Medically stable for DC.     LOS: 11 days   I reviewed palliative notes, last 24 h vitals and pain scores, last 48 h intake and output, last  24 h labs and trends.   Juliet Rude, [facility] Surgery 01/20/2023, 8:35 AM Please see Amion for pager number during day hours 7:00am-4:30pm

## 2023-01-20 NOTE — Plan of Care (Signed)
  Problem: Clinical Measurements: Goal: Ability to maintain clinical measurements within normal limits will improve Outcome: Progressing Goal: Will remain free from infection Outcome: Progressing   Problem: Clinical Measurements: Goal: Ability to maintain clinical measurements within normal limits will improve Outcome: Progressing   Problem: Clinical Measurements: Goal: Will remain free from infection Outcome: Progressing

## 2023-01-20 NOTE — Progress Notes (Signed)
Mobility Specialist Progress Note:   01/20/23 1230  Mobility  Activity Transferred to/from [facility]  Level of Assistance Minimal assist, patient does 75% or more (+2)  Assistive Device Front wheel walker  Distance Ambulated (ft) 3 ft  Activity Response Tolerated well  Mobility Referral Yes  $Mobility charge 1 Mobility  Mobility Specialist Start Time (ACUTE ONLY) 1215  Mobility Specialist Stop Time (ACUTE ONLY) 1230  Mobility Specialist Time Calculation (min) (ACUTE ONLY) 15 min   Pt received in chair, agreeable to transfer to [facility] per RN request. MinA+2 to stand and pivot with RW. Pt calm and responsive to commands. No BM present. Pt returned to chair with RN still present in room.  Leory Plowman  Mobility Specialist Please contact via [employer] office at [phone]

## 2023-01-21 LAB — GLUCOSE, CAPILLARY
Glucose-Capillary: 115 mg/dL — ABNORMAL HIGH (ref 70–99)
Glucose-Capillary: 80 mg/dL (ref 70–99)
Glucose-Capillary: 95 mg/dL (ref 70–99)
Glucose-Capillary: 97 mg/dL (ref 70–99)

## 2023-01-21 MED ORDER — METHOCARBAMOL 500 MG PO TABS
500.0000 mg | ORAL_TABLET | Freq: Three times a day (TID) | ORAL | Status: DC
  Administered 2023-01-21 – 2023-01-23 (×5): 500 mg via ORAL
  Filled 2023-01-21 (×5): qty 1

## 2023-01-21 NOTE — Progress Notes (Signed)
Speech Language Pathology Treatment: Dysphagia  Patient Details Name: [name] [name] MRN: [id number] DOB: 04-03-1945 Today's Date: 01/21/2023 Time: 4098-1191 SLP Time Calculation (min) (ACUTE ONLY): 10 min  Assessment / Plan / Recommendation Clinical Impression  Notes of last few days' reviewed. Pt remains full code; is awaiting D/C to SNF. His level of alertness during our last few sessions has been improved, allowing him to eat more safely and eat greater quantities. The message to family about dementia and its impact on swallowing has been reiterated by other caregivers, including Palliative Care.  Today, Mr. Hoilman enthusiastically ate vanilla pudding and drank sequential sips of water from a cup with ongoing oral delays (swishing), but no cueing needed to initiate and swallow and no coughing/no s/s of aspiration. He is on the appropriate diet.  No further acute care SLP f/u is needed. Our service will sign off.   HPI HPI: Pt is a 77 yo male admitted 11/8 for unwitnessed fall from [facility], CT scan 11/8 no signs of cervical fx, acute fx L ribs 8-10. PMH: Alzheimer's dementia, CAD, Afib on Xarelto, HTN, HLD, OSA on CPAP, bipolar disorder, and anemia      SLP Plan  All goals met      Recommendations for follow up therapy are one component of a multi-disciplinary discharge planning process, led by the attending physician.  Recommendations may be updated based on patient status, additional functional criteria and insurance authorization.    Recommendations  Diet recommendations: Dysphagia 1 (puree);Thin liquid Liquids provided via: Cup;Straw Medication Administration: Crushed with puree Supervision: Trained caregiver to feed patient Compensations: Minimize environmental distractions Postural Changes and/or Swallow Maneuvers: Seated upright 90 degrees                  Oral care BID   Frequent or constant Supervision/Assistance Dysphagia, oropharyngeal phase  (R13.12)     All goals met    Cruze Zingaro L. Samson Frederic, MA CCC/SLP Clinical Specialist - Acute Care SLP Acute Rehabilitation Services Office number [phone]  [name]  01/21/2023, 12:15 PM

## 2023-01-21 NOTE — TOC Progression Note (Signed)
Transition of Care [facility]) - Progression Note    Patient Details  Name: [name] [name] MRN: [id number] Date of Birth: 1945/03/15  Transition of Care [facility]) CM/SW Contact  Glennon Mac, RN Phone Number: 01/21/2023, 5:12 PM  Clinical Narrative:    Insurance authorization has been received for SNF admission; will follow up with facility in AM to see if male bed available.  11/20 -11/26 Plan Auth ID [id number]   Expected Discharge Plan: Skilled Nursing Facility Barriers to Discharge: Continued Medical Work up  Expected Discharge Plan and Services In-house Referral: Clinical Social Work   Post Acute Care Choice: Skilled Nursing Facility Living arrangements for the past 2 months: Skilled Nursing Facility                                       Social Determinants of Health (SDOH) Interventions SDOH Screenings   Food Insecurity: Patient Unable To Answer (01/20/2023)  Housing: Patient Unable To Answer (01/20/2023)  Transportation Needs: Patient Unable To Answer (01/20/2023)  Utilities: Patient Unable To Answer (01/20/2023)    Readmission Risk Interventions     No data to display         Quintella Baton, RN, BSN  Trauma/Neuro ICU Case Manager [phone]

## 2023-01-21 NOTE — Progress Notes (Signed)
[facility] Progress Note     Subjective: Resting comfortably. Denies pain or other complaint this AM. Per palliative note yesterday, patient to remain a FULL CODE at this time.   Objective: Vital signs in last 24 hours: Temp:  [97.5 F (36.4 C)-97.7 F (36.5 C)] 97.5 F (36.4 C) (11/20 0744) Pulse Rate:  [60-76] 73 (11/20 0744) Resp:  [14-21] 21 (11/20 0744) BP: (103-148)/(60-83) 146/76 (11/20 0744) SpO2:  [96 %-100 %] 96 % (11/20 0744) Last BM Date : 01/20/23  Intake/Output from previous day: 11/19 0701 - 11/20 0700 In: 180 [P.O.:180] Out: 875 [Urine:875] Intake/Output this shift: No intake/output data recorded.  PE: Gen: lying in bed, NAD.  Pulm:  Normal effort Abd: Soft, non-distended, Skin: warm and dry, no rashes  Psych: awake and alert  Lab Results:  No results for input(s): "WBC", "HGB", "HCT", "PLT" in the last 72 hours.   BMET No results for input(s): "NA", "K", "CL", "CO2", "GLUCOSE", "BUN", "CREATININE", "CALCIUM" in the last 72 hours.   PT/INR No results for input(s): "LABPROT", "INR" in the last 72 hours.  CMP     Component Value Date/Time   NA 140 01/16/2023 1152   K 3.9 01/16/2023 1152   CL 107 01/16/2023 1152   CO2 27 01/16/2023 1152   GLUCOSE 115 (H) 01/16/2023 1152   BUN 20 01/16/2023 1152   CREATININE 0.95 01/16/2023 1152   CALCIUM 9.5 01/16/2023 1152   PROT 6.7 01/09/2023 1651   ALBUMIN 3.2 (L) 01/09/2023 1651   AST 22 01/09/2023 1651   ALT 10 01/09/2023 1651   ALKPHOS 118 01/09/2023 1651   BILITOT 1.0 01/09/2023 1651   GFRNONAA >60 01/16/2023 1152   Lipase  No results found for: "LIPASE"     Studies/Results: No results found.  Anti-infectives: Anti-infectives (From admission, onward)    None        Assessment/Plan  Unwitnessed Fall Possible concussion - TBI therapies L 8-10 rib fxs - IS, pulm toilet, pain control, muscle relaxers prn OSA - CPAP at night Age indeterminate T3 vertebral endplate  deformity - no intervention Small fluid around R kidney - no evidence of injury Soft tissue mass in RLQ, can't rule out carcinoid - outpatient follow up ? Mass in TI/cecum - outpatient work up 2 non solid nodules in LLL - follow up CT outpatient in 3-6 months Dementia/Agitation - namenda, sinemet, and aricept ordered. Mental status has been waxing and waning. Palliative consult given recent decline in mental status and worsened dysphagia in setting of head injury and palliative consulted. Wife apparently would like to continue full code and full measures. Continue nightly seroquel.  CAD Persistent A fib - on Xarelto at home, on hold for now; hgb stablizing at 9.4 on 11/15. Plt normalize HTN - home meds re-ordered  HLD Anemia Bipolar disorder - He is on quite a few meds at home Remeron, lamictal, haldol. Dysphagia - SLP cleared for DYS 1, eating well   FEN - dys1 VTE - Lovenox, Xarelto on hold ID - none needed Admit - Given recent falls, plan to hold xarelto and follow up outpatient with cardiology to discuss appropriateness of resumption. Medically stable for DC back to [facility]. Awaiting male bed availability     LOS: 12 days   I reviewed palliative notes, last 24 h vitals and pain scores, last 48 h intake and output, last 24 h labs and trends.   Juliet Rude, [facility] Surgery 01/21/2023, 8:17 AM Please  see Amion for pager number during day hours 7:00am-4:30pm

## 2023-01-21 NOTE — Progress Notes (Signed)
   01/21/23 1900  BiPAP/CPAP/SIPAP  Reason BIPAP/CPAP not in use Non-compliant   Refused

## 2023-01-21 NOTE — Progress Notes (Signed)
   01/21/23 2000  BiPAP/CPAP/SIPAP  Reason BIPAP/CPAP not in use Non-compliant   Pt refused

## 2023-01-21 NOTE — Plan of Care (Signed)
  Problem: Education: Goal: Knowledge of General Education information will improve Description: Including pain rating scale, medication(s)/side effects and non-pharmacologic comfort measures Outcome: Progressing   Problem: Health Behavior/Discharge Planning: Goal: Ability to manage health-related needs will improve Outcome: Progressing   Problem: Clinical Measurements: Goal: Ability to maintain clinical measurements within normal limits will improve Outcome: Progressing Goal: Will remain free from infection Outcome: Progressing Goal: Diagnostic test results will improve Outcome: Progressing Goal: Respiratory complications will improve Outcome: Progressing Goal: Cardiovascular complication will be avoided Outcome: Progressing   Problem: Activity: Goal: Risk for activity intolerance will decrease Outcome: Progressing   Problem: Nutrition: Goal: Adequate nutrition will be maintained Outcome: Progressing   Problem: Coping: Goal: Level of anxiety will decrease Outcome: Progressing   Problem: Elimination: Goal: Will not experience complications related to bowel motility Outcome: Progressing Goal: Will not experience complications related to urinary retention Outcome: Progressing   Problem: Pain Management: Goal: General experience of comfort will improve Outcome: Progressing   Problem: Safety: Goal: Ability to remain free from injury will improve Outcome: Progressing   Problem: Skin Integrity: Goal: Risk for impaired skin integrity will decrease Outcome: Progressing   Problem: Education: Goal: Knowledge of the prescribed therapeutic regimen Outcome: Progressing Goal: Knowledge of disease or condition will improve Outcome: Progressing   Problem: Clinical Measurements: Goal: Neurologic status will improve Outcome: Progressing   Problem: Tissue Perfusion: Goal: Ability to maintain intracranial pressure will improve Outcome: Progressing   Problem:  Respiratory: Goal: Will regain and/or maintain adequate ventilation Outcome: Progressing   Problem: Skin Integrity: Goal: Risk for impaired skin integrity will decrease Outcome: Progressing Goal: Demonstration of wound healing without infection will improve Outcome: Progressing   Problem: Psychosocial: Goal: Ability to verbalize positive feelings about self will improve Outcome: Progressing Goal: Ability to participate in self-care as condition permits will improve Outcome: Progressing Goal: Ability to identify appropriate support needs will improve Outcome: Progressing   Problem: Health Behavior/Discharge Planning: Goal: Ability to manage health-related needs will improve Outcome: Progressing   Problem: Nutritional: Goal: Risk of aspiration will decrease Outcome: Progressing Goal: Dietary intake will improve Outcome: Progressing   Problem: Communication: Goal: Ability to communicate needs accurately will improve Outcome: Progressing   Problem: Safety: Goal: Non-violent Restraint(s) Outcome: Progressing

## 2023-01-21 NOTE — Progress Notes (Signed)
Occupational Therapy Treatment Patient Details Name: [name] [name] MRN: [id number] DOB: 1945-07-21 Today's Date: 01/21/2023   History of present illness Pt is a 77 yo male admitted 11/8 for unwitnessed fall from [facility], CT scan 11/8 no signs of cervical fx, acute fx L ribs 8-10. PMH: Alzheimer's dementia, CAD, Afib on Xarelto, HTN, HLD, OSA on CPAP, bipolar disorder, and anemia   OT comments  Pt alert during session, able to fully participate. Pt requires increased verbal cueing for sequencing, problem solving, able to follow simple instructions ~50% of time. Pt A/Ox1, overall poor safety awareness. Pt displays good overall strength, able to complete dressing at bedside with mod A, mainly due to poor sequencing/problem solving. Pt able to ambulate around room with CGA using RW, no LOB, takes his time, but poor safety awareness due to line management. Pt would benefit from continued acute OT to progress as able, return to postacute care still appropriate.       If plan is discharge home, recommend the following:  A little help with walking and/or transfers;A lot of help with bathing/dressing/bathroom;Assistance with cooking/housework;Direct supervision/assist for medications management;Direct supervision/assist for financial management;Assist for transportation;Help with stairs or ramp for entrance;Supervision due to cognitive status;Assistance with feeding   Equipment Recommendations  None recommended by OT    Recommendations for Other Services      Precautions / Restrictions Precautions Precautions: Fall Restrictions Weight Bearing Restrictions: No       Mobility Bed Mobility Overal bed mobility: Needs Assistance Bed Mobility: Supine to Sit, Sit to Supine     Supine to sit: Min assist, HOB elevated Sit to supine: Contact guard assist   General bed mobility comments: min A to power sitting up to EOB, unable to sequence how to sit up. Pt CGA for back to bed and  scooting back.    Transfers Overall transfer level: Needs assistance Equipment used: Rolling walker (2 wheels) Transfers: Sit to/from Stand, Bed to chair/wheelchair/BSC Sit to Stand: Contact guard assist     Step pivot transfers: Contact guard assist     General transfer comment: CGA, increased time, poor safety awarness, no LOB     Balance Overall balance assessment: Needs assistance Sitting-balance support: No upper extremity supported, Feet supported, Single extremity supported Sitting balance-Leahy Scale: Fair Sitting balance - Comments: EOB performing ADLs   Standing balance support: Bilateral upper extremity supported, During functional activity Standing balance-Leahy Scale: Fair Standing balance comment: able to stand using RW for support, no LOB, able to shift weight                           ADL either performed or assessed with clinical judgement   ADL Overall ADL's : Needs assistance/impaired                 Upper Body Dressing : Moderate assistance   Lower Body Dressing: Sit to/from stand;Moderate assistance               Functional mobility during ADLs: Contact guard assist;Cueing for safety;Cueing for sequencing General ADL Comments: Pt able to participate in dressing activity at bedside, improved from admission, min-mod for dressing, difficulty sequencing steps and with problem solving.    Extremity/Trunk Assessment              Vision       Perception     Praxis      Cognition Arousal: Alert Behavior During Therapy: Flat affect Overall  Cognitive Status: History of cognitive impairments - at baseline                                 General Comments: alert, able to follow simple commands ~50% of time, difficulty with problem solving, A/Ox1.        Exercises      Shoulder Instructions       General Comments      Pertinent Vitals/ Pain       Pain Assessment Pain Assessment: No/denies  pain  Home Living                                          Prior Functioning/Environment              Frequency  Min 1X/week        Progress Toward Goals  OT Goals(current goals can now be found in the care plan section)  Progress towards OT goals: Progressing toward goals  Acute Rehab OT Goals Patient Stated Goal: not able to participate in goal setting OT Goal Formulation: With patient Time For Goal Achievement: 01/24/23 Potential to Achieve Goals: Fair ADL Goals Pt Will Perform Eating: with supervision;sitting Pt Will Perform Grooming: with contact guard assist;sitting Pt Will Transfer to Toilet: with contact guard assist;ambulating;regular height toilet;grab bars Additional ADL Goal #1: Patient will demonstrate ability to perform bed mobility during/in preparation for functional tasks with Contact guard assist and cues for initation, sequencing, and attention to task.  Plan      Co-evaluation                 AM-PAC OT "6 Clicks" Daily Activity     Outcome Measure   Help from another person eating meals?: Total Help from another person taking care of personal grooming?: A Lot Help from another person toileting, which includes using toliet, bedpan, or urinal?: A Lot Help from another person bathing (including washing, rinsing, drying)?: A Lot Help from another person to put on and taking off regular upper body clothing?: A Lot Help from another person to put on and taking off regular lower body clothing?: A Lot 6 Click Score: 11    End of Session Equipment Utilized During Treatment: Gait belt;Rolling walker (2 wheels)  OT Visit Diagnosis: Ataxia, unspecified (R27.0);History of falling (Z91.81);Muscle weakness (generalized) (M62.81)   Activity Tolerance Patient tolerated treatment well   Patient Left in bed;with call bell/phone within reach;with bed alarm set   Nurse Communication Mobility status        Time: 4403-4742 OT Time  Calculation (min): 28 min  Charges: OT General Charges $OT Visit: 1 Visit OT Treatments $Self Care/Home Management : 8-22 mins $Therapeutic Activity: 8-22 mins  Mafalda Mcginniss, OTR/L   Lerlene Treadwell R Kierrah Kilbride 01/21/2023, 1:22 PM

## 2023-01-22 LAB — GLUCOSE, CAPILLARY
Glucose-Capillary: 102 mg/dL — ABNORMAL HIGH (ref 70–99)
Glucose-Capillary: 90 mg/dL (ref 70–99)
Glucose-Capillary: 121 mg/dL — ABNORMAL HIGH (ref 70–99)

## 2023-01-22 NOTE — TOC Progression Note (Signed)
Transition of Care [facility]) - Progression Note    Patient Details  Name: [name] [name] MRN: [id number] Date of Birth: [birth year]  [facility]) CM/SW Contact  Astrid Drafts Berna Spare, RN Phone Number: 01/22/2023, 4:16 PM  Clinical Narrative:    No male beds available at [facility] health care today; Olegario Messier in admissions states facility could have male bed tomorrow, but definitely Saturday.  Will continue conversations with facility admissions on bed availability and update family accordingly.   Expected Discharge Plan: Skilled Nursing Facility Barriers to Discharge: Continued Medical Work up  Expected Discharge Plan and Services In-house Referral: Clinical Social Work   Post Acute Care Choice: Skilled Nursing Facility Living arrangements for the past 2 months: Skilled Nursing Facility                                       Social Determinants of Health (SDOH) Interventions SDOH Screenings   Food Insecurity: Patient Unable To Answer (01/20/2023)  Housing: Patient Unable To Answer (01/20/2023)  Transportation Needs: Patient Unable To Answer (01/20/2023)  Utilities: Patient Unable To Answer (01/20/2023)    Readmission Risk Interventions     No data to display         Quintella Baton, RN, BSN  Trauma/Neuro ICU Case Manager [phone]

## 2023-01-22 NOTE — Progress Notes (Signed)
Trauma/Critical Care Follow Up Note  Subjective:    Overnight Issues:   Objective:  Vital signs for last 24 hours: Temp:  [97.5 F (36.4 C)-98.2 F (36.8 C)] 97.8 F (36.6 C) (11/21 0725) Pulse Rate:  [73-97] 97 (11/21 0725) Resp:  [12-21] 16 (11/21 0725) BP: (84-152)/(34-81) 84/34 (11/21 0725) SpO2:  [95 %-99 %] 99 % (11/21 0349)  Hemodynamic parameters for last 24 hours:    Intake/Output from previous day: 11/20 0701 - 11/21 0700 In: 236 [P.O.:236] Out: 450 [Urine:450]  Intake/Output this shift: No intake/output data recorded.  Vent settings for last 24 hours:    Physical Exam:  Gen: comfortable, no distress Neuro: follows commands, alert, communicative HEENT: PERRL Neck: supple CV: RRR Pulm: unlabored breathing on RA Abd: soft, NT    GU: urine clear and yellow, +spontaneous voids Extr: wwp, no edema  Results for orders placed or performed during the hospital encounter of 01/09/23 (from the past 24 hour(s))  Glucose, capillary     Status: Abnormal   Collection Time: 01/21/23 12:26 PM  Result Value Ref Range   Glucose-Capillary 115 (H) 70 - 99 mg/dL  Glucose, capillary     Status: None   Collection Time: 01/21/23  6:26 PM  Result Value Ref Range   Glucose-Capillary 95 70 - 99 mg/dL  Glucose, capillary     Status: Abnormal   Collection Time: 01/22/23  1:33 AM  Result Value Ref Range   Glucose-Capillary 102 (H) 70 - 99 mg/dL  Glucose, capillary     Status: None   Collection Time: 01/22/23  6:34 AM  Result Value Ref Range   Glucose-Capillary 90 70 - 99 mg/dL    Assessment & Plan:  Present on Admission:  Concussion    LOS: 13 days   Additional comments:I reviewed the patient's new clinical lab test results.   and I reviewed the patients new imaging test results.    Unwitnessed Fall Possible concussion - TBI therapies L 8-10 rib fxs - IS, pulm toilet, pain control, muscle relaxers prn OSA - CPAP at night Age indeterminate T3 vertebral endplate  deformity - no intervention Small fluid around R kidney - no evidence of injury Soft tissue mass in RLQ, can't rule out carcinoid - outpatient follow up ? Mass in TI/cecum - outpatient work up 2 non solid nodules in LLL - follow up CT outpatient in 3-6 months Dementia/Agitation - namenda, sinemet, and aricept ordered. Mental status has been waxing and waning. Palliative consult given recent decline in mental status and worsened dysphagia in setting of head injury and palliative consulted. Wife apparently would like to continue full code and full measures. Continue nightly seroquel.  CAD Persistent A fib - on Xarelto at home, on hold for now; hgb stablizing at 9.4 on 11/15. Plt normalize HTN - home meds re-ordered  HLD Anemia Bipolar disorder - He is on quite a few meds at home Remeron, lamictal, haldol. Dysphagia - SLP cleared for DYS 1, eating well    FEN - dys1 VTE - Lovenox, Xarelto on hold ID - none needed Admit - Given recent falls, plan to hold xarelto and follow up outpatient with cardiology to discuss appropriateness of resumption. Medically stable for DC back to [facility]. Awaiting male bed availability.  Diamantina Monks, MD Trauma & General Surgery Please use [email or web address] to contact on call provider  01/22/2023  *Care during the described time interval was provided by me. I have reviewed this patient's available data, including  medical history, events of note, physical examination and test results as part of my evaluation.

## 2023-01-23 ENCOUNTER — Inpatient Hospital Stay (HOSPITAL_COMMUNITY): Payer: Medicare PPO

## 2023-01-23 DIAGNOSIS — S060X0A Concussion without loss of consciousness, initial encounter: Secondary | ICD-10-CM | POA: Diagnosis not present

## 2023-01-23 DIAGNOSIS — F3112 Bipolar disorder, current episode manic without psychotic features, moderate: Secondary | ICD-10-CM | POA: Diagnosis not present

## 2023-01-23 DIAGNOSIS — R627 Adult failure to thrive: Secondary | ICD-10-CM | POA: Diagnosis not present

## 2023-01-23 DIAGNOSIS — E785 Hyperlipidemia, unspecified: Secondary | ICD-10-CM | POA: Diagnosis not present

## 2023-01-23 DIAGNOSIS — S2242XA Multiple fractures of ribs, left side, initial encounter for closed fracture: Secondary | ICD-10-CM | POA: Diagnosis not present

## 2023-01-23 DIAGNOSIS — Z515 Encounter for palliative care: Secondary | ICD-10-CM | POA: Diagnosis not present

## 2023-01-23 DIAGNOSIS — R531 Weakness: Secondary | ICD-10-CM | POA: Diagnosis not present

## 2023-01-23 DIAGNOSIS — F0390 Unspecified dementia without behavioral disturbance: Secondary | ICD-10-CM | POA: Diagnosis not present

## 2023-01-23 DIAGNOSIS — R402222 Coma scale, best verbal response, incomprehensible words, at arrival to emergency department: Secondary | ICD-10-CM | POA: Diagnosis not present

## 2023-01-23 DIAGNOSIS — G20A1 Parkinson's disease without dyskinesia, without mention of fluctuations: Secondary | ICD-10-CM | POA: Diagnosis not present

## 2023-01-23 DIAGNOSIS — R051 Acute cough: Secondary | ICD-10-CM | POA: Diagnosis not present

## 2023-01-23 DIAGNOSIS — G309 Alzheimer's disease, unspecified: Secondary | ICD-10-CM | POA: Diagnosis not present

## 2023-01-23 DIAGNOSIS — R0989 Other specified symptoms and signs involving the circulatory and respiratory systems: Secondary | ICD-10-CM | POA: Diagnosis not present

## 2023-01-23 DIAGNOSIS — R296 Repeated falls: Secondary | ICD-10-CM | POA: Diagnosis not present

## 2023-01-23 DIAGNOSIS — F319 Bipolar disorder, unspecified: Secondary | ICD-10-CM | POA: Diagnosis not present

## 2023-01-23 DIAGNOSIS — R1319 Other dysphagia: Secondary | ICD-10-CM | POA: Diagnosis not present

## 2023-01-23 DIAGNOSIS — S060XAA Concussion with loss of consciousness status unknown, initial encounter: Secondary | ICD-10-CM | POA: Diagnosis not present

## 2023-01-23 DIAGNOSIS — I482 Chronic atrial fibrillation, unspecified: Secondary | ICD-10-CM | POA: Diagnosis not present

## 2023-01-23 DIAGNOSIS — F339 Major depressive disorder, recurrent, unspecified: Secondary | ICD-10-CM | POA: Diagnosis not present

## 2023-01-23 DIAGNOSIS — E539 Vitamin B deficiency, unspecified: Secondary | ICD-10-CM | POA: Diagnosis not present

## 2023-01-23 DIAGNOSIS — S22000A Wedge compression fracture of unspecified thoracic vertebra, initial encounter for closed fracture: Secondary | ICD-10-CM | POA: Diagnosis not present

## 2023-01-23 DIAGNOSIS — N1831 Chronic kidney disease, stage 3a: Secondary | ICD-10-CM | POA: Diagnosis not present

## 2023-01-23 DIAGNOSIS — F03C18 Unspecified dementia, severe, with other behavioral disturbance: Secondary | ICD-10-CM | POA: Diagnosis not present

## 2023-01-23 DIAGNOSIS — F03918 Unspecified dementia, unspecified severity, with other behavioral disturbance: Secondary | ICD-10-CM | POA: Diagnosis not present

## 2023-01-23 DIAGNOSIS — F411 Generalized anxiety disorder: Secondary | ICD-10-CM | POA: Diagnosis not present

## 2023-01-23 DIAGNOSIS — F0280 Dementia in other diseases classified elsewhere without behavioral disturbance: Secondary | ICD-10-CM | POA: Diagnosis not present

## 2023-01-23 DIAGNOSIS — S2249XA Multiple fractures of ribs, unspecified side, initial encounter for closed fracture: Secondary | ICD-10-CM | POA: Diagnosis not present

## 2023-01-23 DIAGNOSIS — D6489 Other specified anemias: Secondary | ICD-10-CM | POA: Diagnosis not present

## 2023-01-23 DIAGNOSIS — I69391 Dysphagia following cerebral infarction: Secondary | ICD-10-CM | POA: Diagnosis not present

## 2023-01-23 DIAGNOSIS — R402112 Coma scale, eyes open, never, at arrival to emergency department: Secondary | ICD-10-CM | POA: Diagnosis not present

## 2023-01-23 DIAGNOSIS — D649 Anemia, unspecified: Secondary | ICD-10-CM | POA: Diagnosis not present

## 2023-01-23 DIAGNOSIS — R1312 Dysphagia, oropharyngeal phase: Secondary | ICD-10-CM | POA: Diagnosis not present

## 2023-01-23 DIAGNOSIS — Z79899 Other long term (current) drug therapy: Secondary | ICD-10-CM | POA: Diagnosis not present

## 2023-01-23 DIAGNOSIS — S2242XD Multiple fractures of ribs, left side, subsequent encounter for fracture with routine healing: Secondary | ICD-10-CM | POA: Diagnosis not present

## 2023-01-23 DIAGNOSIS — F0283 Dementia in other diseases classified elsewhere, unspecified severity, with mood disturbance: Secondary | ICD-10-CM | POA: Diagnosis not present

## 2023-01-23 DIAGNOSIS — R5383 Other fatigue: Secondary | ICD-10-CM | POA: Diagnosis not present

## 2023-01-23 DIAGNOSIS — E860 Dehydration: Secondary | ICD-10-CM | POA: Diagnosis not present

## 2023-01-23 DIAGNOSIS — R41841 Cognitive communication deficit: Secondary | ICD-10-CM | POA: Diagnosis not present

## 2023-01-23 DIAGNOSIS — R059 Cough, unspecified: Secondary | ICD-10-CM | POA: Diagnosis not present

## 2023-01-23 DIAGNOSIS — Z7401 Bed confinement status: Secondary | ICD-10-CM | POA: Diagnosis not present

## 2023-01-23 DIAGNOSIS — I4819 Other persistent atrial fibrillation: Secondary | ICD-10-CM | POA: Diagnosis not present

## 2023-01-23 DIAGNOSIS — R1310 Dysphagia, unspecified: Secondary | ICD-10-CM | POA: Diagnosis not present

## 2023-01-23 DIAGNOSIS — D631 Anemia in chronic kidney disease: Secondary | ICD-10-CM | POA: Diagnosis not present

## 2023-01-23 DIAGNOSIS — M6281 Muscle weakness (generalized): Secondary | ICD-10-CM | POA: Diagnosis not present

## 2023-01-23 DIAGNOSIS — I10 Essential (primary) hypertension: Secondary | ICD-10-CM | POA: Diagnosis not present

## 2023-01-23 DIAGNOSIS — J9690 Respiratory failure, unspecified, unspecified whether with hypoxia or hypercapnia: Secondary | ICD-10-CM | POA: Diagnosis not present

## 2023-01-23 LAB — BASIC METABOLIC PANEL
Anion gap: 8 (ref 5–15)
BUN: 21 mg/dL (ref 8–23)
CO2: 29 mmol/L (ref 22–32)
Calcium: 9.9 mg/dL (ref 8.9–10.3)
Chloride: 107 mmol/L (ref 98–111)
Creatinine, Ser: 1.27 mg/dL — ABNORMAL HIGH (ref 0.61–1.24)
GFR, Estimated: 57 mL/min — ABNORMAL LOW (ref >60)
Glucose, Bld: 105 mg/dL — ABNORMAL HIGH (ref 70–99)
Potassium: 4.3 mmol/L (ref 3.5–5.1)
Sodium: 144 mmol/L (ref 135–145)

## 2023-01-23 LAB — GLUCOSE, CAPILLARY
Glucose-Capillary: 128 mg/dL — ABNORMAL HIGH (ref 70–99)
Glucose-Capillary: 150 mg/dL — ABNORMAL HIGH (ref 70–99)
Glucose-Capillary: 87 mg/dL (ref 70–99)

## 2023-01-23 MED ORDER — METHOCARBAMOL 500 MG PO TABS: 500.0000 mg | ORAL_TABLET | Freq: Three times a day (TID) | ORAL | Status: DC | PRN

## 2023-01-23 MED ORDER — ACETAMINOPHEN 500 MG PO TABS: 1000.0000 mg | ORAL_TABLET | Freq: Four times a day (QID) | ORAL | Status: DC | PRN

## 2023-01-23 MED ORDER — LACTATED RINGERS IV BOLUS
500.0000 mL | Freq: Once | INTRAVENOUS | Status: AC
  Administered 2023-01-23: 500 mL via INTRAVENOUS

## 2023-01-23 NOTE — Progress Notes (Signed)
Physical Therapy Treatment Patient Details Name: [name] [name] MRN: [id number] DOB: 1945/05/26 Today's Date: 01/23/2023   History of Present Illness Pt is a 77 yo male admitted 11/8 for unwitnessed fall from [facility], CT scan 11/8 no signs of cervical fx, acute fx L ribs 8-10. PMH: Alzheimer's dementia, CAD, Afib on Xarelto, HTN, HLD, OSA on CPAP, bipolar disorder, and anemia    PT Comments  Pt greeted in supine post bedside EKG, pt semi alert in supine, continued trend of increased alertness in sitting. Was alerted by nursing of concern of ambulating due to low BP, BP 109/63(75) supine upon arrival, 94/52(65) seated EOB, pt ambulated 217ft HHA. Pt family present toward end of session, pt seemed to show increased activity with family present.      If plan is discharge home, recommend the following: Assistance with cooking/housework;Assist for transportation;Help with stairs or ramp for entrance;Supervision due to cognitive status;Two people to help with walking and/or transfers;Two people to help with bathing/dressing/bathroom   Can travel by private vehicle     Yes  Equipment Recommendations  None recommended by PT    Recommendations for Other Services       Precautions / Restrictions Precautions Precautions: Fall Restrictions Weight Bearing Restrictions: No     Mobility  Bed Mobility Overal bed mobility: Needs Assistance Bed Mobility: Supine to Sit     Supine to sit: Min assist, HOB elevated, Used rails     General bed mobility comments: pt able to swing LE OOB easily and used bed rails to adjust angle to get EOB, still required minA to facilitate trunk elevation    Transfers Overall transfer level: Needs assistance Equipment used: 1 person hand held assist Transfers: Sit to/from Stand Sit to Stand: Contact guard assist           General transfer comment: easily stood from EOB    Ambulation/Gait Ambulation/Gait assistance: Min assist Gait  Distance (Feet): 150 Feet Assistive device: 1 person hand held assist Gait Pattern/deviations: Decreased step length - left, Decreased step length - right, Decreased stride length, Knee flexed in stance - left, Knee flexed in stance - right, Shuffle, Drifts right/left, Narrow base of support   Gait velocity interpretation: <1.8 ft/sec, indicate of risk for recurrent falls   General Gait Details: pt gait improved from last session, no longer limited by increased knee pain, minA with LOB during turn and slight steadying with occasional buckling.   Stairs             Wheelchair Mobility     Tilt Bed    Modified Rankin (Stroke Patients Only)       Balance Overall balance assessment: Needs assistance Sitting-balance support: No upper extremity supported, Feet supported, Single extremity supported Sitting balance-Leahy Scale: Fair     Standing balance support: Single extremity supported, During functional activity (Reliant on therapist for balance) Standing balance-Leahy Scale: Fair Standing balance comment: LOB during turn                            Cognition Arousal: Lethargic, Alert Behavior During Therapy: Flat affect Overall Cognitive Status: History of cognitive impairments - at baseline                                 General Comments: pt responive and alert compared to previous sessions, was oriented to self only, daughter present at  end of session pt able to explain who she was and seemed more alert once she arrived        Exercises      General Comments        Pertinent Vitals/Pain Pain Assessment Pain Assessment: Faces Faces Pain Scale: Hurts a little bit Pain Location: rt knee Pain Descriptors / Indicators: Discomfort, Grimacing Pain Intervention(s): Monitored during session, Limited activity within patient's tolerance    Home Living                          Prior Function            PT Goals (current  goals can now be found in the care plan section) Progress towards PT goals: Progressing toward goals    Frequency    Min 1X/week      PT Plan      Co-evaluation              AM-PAC PT "6 Clicks" Mobility   Outcome Measure  Help needed turning from your back to your side while in a flat bed without using bedrails?: A Little Help needed moving from lying on your back to sitting on the side of a flat bed without using bedrails?: A Little Help needed moving to and from a bed to a chair (including a wheelchair)?: A Lot Help needed standing up from a chair using your arms (e.g., wheelchair or bedside chair)?: A Little Help needed to walk in hospital room?: A Lot Help needed climbing 3-5 steps with a railing? : Total 6 Click Score: 14    End of Session Equipment Utilized During Treatment: Gait belt Activity Tolerance: Patient tolerated treatment well Patient left: in chair;with call bell/phone within reach;with chair alarm set;with family/visitor present;with restraints reapplied (pt only donned L mit per family request, family instructed to inform nursing prior to leaving, nursing informed) Nurse Communication: Mobility status PT Visit Diagnosis: Unsteadiness on feet (R26.81);Other abnormalities of gait and mobility (R26.89);Repeated falls (R29.6);History of falling (Z91.81);Muscle weakness (generalized) (M62.81);Difficulty in walking, not elsewhere classified (R26.2)     Time: 1226-1300 PT Time Calculation (min) (ACUTE ONLY): 34 min  Charges:    $Gait Training: 8-22 mins $Therapeutic Activity: 8-22 mins PT General Charges $$ ACUTE PT VISIT: 1 Visit                     Andrey Farmer SPT Secure chat preferred    Darlin Drop 01/23/2023, 1:43 PM

## 2023-01-23 NOTE — Progress Notes (Signed)
Patient discharged from unit medications and property  sent with [facility] staff. Discharge instructions reviewed and patient questions answered

## 2023-01-23 NOTE — Plan of Care (Signed)
  Problem: Education: Goal: Knowledge of General Education information will improve Description: Including pain rating scale, medication(s)/side effects and non-pharmacologic comfort measures Outcome: Adequate for Discharge   Problem: Health Behavior/Discharge Planning: Goal: Ability to manage health-related needs will improve Outcome: Adequate for Discharge   Problem: Clinical Measurements: Goal: Ability to maintain clinical measurements within normal limits will improve Outcome: Adequate for Discharge Goal: Will remain free from infection Outcome: Adequate for Discharge Goal: Diagnostic test results will improve Outcome: Adequate for Discharge Goal: Respiratory complications will improve Outcome: Adequate for Discharge Goal: Cardiovascular complication will be avoided Outcome: Adequate for Discharge   Problem: Activity: Goal: Risk for activity intolerance will decrease Outcome: Adequate for Discharge   Problem: Nutrition: Goal: Adequate nutrition will be maintained Outcome: Adequate for Discharge   Problem: Coping: Goal: Level of anxiety will decrease Outcome: Adequate for Discharge   Problem: Elimination: Goal: Will not experience complications related to bowel motility Outcome: Adequate for Discharge Goal: Will not experience complications related to urinary retention Outcome: Adequate for Discharge   Problem: Pain Management: Goal: General experience of comfort will improve Outcome: Adequate for Discharge   Problem: Safety: Goal: Ability to remain free from injury will improve Outcome: Adequate for Discharge   Problem: Skin Integrity: Goal: Risk for impaired skin integrity will decrease Outcome: Adequate for Discharge   Problem: Education: Goal: Knowledge of the prescribed therapeutic regimen Outcome: Adequate for Discharge Goal: Knowledge of disease or condition will improve Outcome: Adequate for Discharge   Problem: Clinical Measurements: Goal:  Neurologic status will improve Outcome: Adequate for Discharge   Problem: Tissue Perfusion: Goal: Ability to maintain intracranial pressure will improve Outcome: Adequate for Discharge   Problem: Respiratory: Goal: Will regain and/or maintain adequate ventilation Outcome: Adequate for Discharge   Problem: Skin Integrity: Goal: Risk for impaired skin integrity will decrease Outcome: Adequate for Discharge Goal: Demonstration of wound healing without infection will improve Outcome: Adequate for Discharge   Problem: Psychosocial: Goal: Ability to verbalize positive feelings about self will improve Outcome: Adequate for Discharge Goal: Ability to participate in self-care as condition permits will improve Outcome: Adequate for Discharge Goal: Ability to identify appropriate support needs will improve Outcome: Adequate for Discharge   Problem: Health Behavior/Discharge Planning: Goal: Ability to manage health-related needs will improve Outcome: Adequate for Discharge   Problem: Nutritional: Goal: Risk of aspiration will decrease Outcome: Adequate for Discharge Goal: Dietary intake will improve Outcome: Adequate for Discharge   Problem: Communication: Goal: Ability to communicate needs accurately will improve Outcome: Adequate for Discharge   Problem: Safety: Goal: Non-violent Restraint(s) Outcome: Adequate for Discharge

## 2023-01-23 NOTE — Progress Notes (Signed)
[facility] Progress Note     Subjective: Resting comfortably. Awaiting SNF bed.   Objective: Vital signs in last 24 hours: Temp:  [97.7 F (36.5 C)-98.1 F (36.7 C)] 98 F (36.7 C) (11/22 0750) Pulse Rate:  [61-105] 70 (11/22 0750) Resp:  [11-22] 19 (11/22 0750) BP: (128-153)/(58-79) 133/71 (11/22 0750) SpO2:  [98 %-100 %] 100 % (11/22 0750) Last BM Date : 01/20/23  Intake/Output from previous day: 11/21 0701 - 11/22 0700 In: 110 [P.O.:110] Out: 1700 [Urine:1700] Intake/Output this shift: No intake/output data recorded.  PE: Gen: lying in bed, NAD.  Pulm:  Normal effort Abd: Soft, non-distended, Skin: warm and dry, no rashes  Psych: awake and alert  Lab Results:  No results for input(s): "WBC", "HGB", "HCT", "PLT" in the last 72 hours.   BMET No results for input(s): "NA", "K", "CL", "CO2", "GLUCOSE", "BUN", "CREATININE", "CALCIUM" in the last 72 hours.   PT/INR No results for input(s): "LABPROT", "INR" in the last 72 hours.  CMP     Component Value Date/Time   NA 140 01/16/2023 1152   K 3.9 01/16/2023 1152   CL 107 01/16/2023 1152   CO2 27 01/16/2023 1152   GLUCOSE 115 (H) 01/16/2023 1152   BUN 20 01/16/2023 1152   CREATININE 0.95 01/16/2023 1152   CALCIUM 9.5 01/16/2023 1152   PROT 6.7 01/09/2023 1651   ALBUMIN 3.2 (L) 01/09/2023 1651   AST 22 01/09/2023 1651   ALT 10 01/09/2023 1651   ALKPHOS 118 01/09/2023 1651   BILITOT 1.0 01/09/2023 1651   GFRNONAA >60 01/16/2023 1152   Lipase  No results found for: "LIPASE"     Studies/Results: No results found.  Anti-infectives: Anti-infectives (From admission, onward)    None        Assessment/Plan  Unwitnessed Fall Possible concussion - TBI therapies L 8-10 rib fxs - IS, pulm toilet, pain control, muscle relaxers prn OSA - CPAP at night Age indeterminate T3 vertebral endplate deformity - no intervention Small fluid around R kidney - no evidence of injury Soft tissue mass  in RLQ, can't rule out carcinoid - outpatient follow up ? Mass in TI/cecum - outpatient work up 2 non solid nodules in LLL - follow up CT outpatient in 3-6 months Dementia/Agitation - namenda, sinemet, and aricept ordered. Mental status has been waxing and waning. Palliative consult given recent decline in mental status and worsened dysphagia in setting of head injury and palliative consulted. Wife apparently would like to continue full code and full measures. Continue nightly seroquel.  CAD Persistent A fib - on Xarelto at home, on hold for now; hgb stablizing at 9.4 on 11/15. Plt normalize HTN - home meds re-ordered  HLD Anemia Bipolar disorder - He is on quite a few meds at home Remeron, lamictal, haldol. Dysphagia - SLP cleared for DYS 1, eating well   FEN - dys1 VTE - Lovenox, Xarelto on hold ID - none needed Admit - Given recent falls, plan to hold xarelto and follow up outpatient with cardiology to discuss appropriateness of resumption. Medically stable for DC back to [facility]. Awaiting male bed availability     LOS: 14 days   I reviewed last 24 h vitals and pain scores, last 48 h intake and output, last 24 h labs and trends.   Juliet Rude, [facility] 01/23/2023, 9:20 AM Please see Amion for pager number during day hours 7:00am-4:30pm

## 2023-01-23 NOTE — TOC Transition Note (Signed)
Transition of Care [facility]) - CM/SW Discharge Note   Patient Details  Name: [name] [name] MRN: [id number] Date of Birth: 07-09-1945  [facility]) CM/SW Contact:  Glennon Mac, RN Phone Number: 01/23/2023, 3:03 PM   Clinical Narrative:    Patient medically stable for discharge to SNF today, and bed available at [facility]. Discharge summary forwarded to facility.  Notified patient's daughter [name] [name] of plan for dc back to [facility] today, and she is in agreement with plan.  Patient going to room 118; bedside nurse to call report to [phone]. PTAR notified for transport at 3:03pm   Final next level of care: Skilled Nursing Facility Barriers to Discharge: Barriers Resolved   Patient Goals and CMS Choice CMS Medicare.gov Compare Post Acute Care list provided to:: Patient Represenative (must comment) (wife) Choice offered to / list presented to : Spouse, Adult Children  Discharge Placement     Existing PASRR number confirmed : 01/23/23          Patient chooses bed at: [facility] Patient to be transferred to facility by: PTAR Name of family member notified: Daughter [name] [name] Patient and family notified of of transfer: 01/23/23  Discharge Plan and Services Additional resources added to the After Visit Summary for   In-house Referral: Clinical Social Work   Post Acute Care Choice: Skilled Nursing Facility                               Social Determinants of Health (SDOH) Interventions SDOH Screenings   Food Insecurity: Patient Unable To Answer (01/20/2023)  Housing: Patient Unable To Answer (01/20/2023)  Transportation Needs: Patient Unable To Answer (01/20/2023)  Utilities: Patient Unable To Answer (01/20/2023)     Readmission Risk Interventions     No data to display          Quintella Baton, RN, BSN  Trauma/Neuro ICU Case Manager [phone]

## 2023-01-24 DIAGNOSIS — I482 Chronic atrial fibrillation, unspecified: Secondary | ICD-10-CM | POA: Diagnosis not present

## 2023-01-24 DIAGNOSIS — I10 Essential (primary) hypertension: Secondary | ICD-10-CM | POA: Diagnosis not present

## 2023-01-24 DIAGNOSIS — R296 Repeated falls: Secondary | ICD-10-CM | POA: Diagnosis not present

## 2023-01-24 DIAGNOSIS — R531 Weakness: Secondary | ICD-10-CM | POA: Diagnosis not present

## 2023-01-24 DIAGNOSIS — F03C18 Unspecified dementia, severe, with other behavioral disturbance: Secondary | ICD-10-CM | POA: Diagnosis not present

## 2023-01-25 DIAGNOSIS — R0989 Other specified symptoms and signs involving the circulatory and respiratory systems: Secondary | ICD-10-CM | POA: Diagnosis not present

## 2023-01-25 DIAGNOSIS — R1310 Dysphagia, unspecified: Secondary | ICD-10-CM | POA: Diagnosis not present

## 2023-01-25 DIAGNOSIS — R5383 Other fatigue: Secondary | ICD-10-CM | POA: Diagnosis not present

## 2023-01-25 DIAGNOSIS — R051 Acute cough: Secondary | ICD-10-CM | POA: Diagnosis not present

## 2023-01-26 ENCOUNTER — Encounter (HOSPITAL_COMMUNITY): Payer: Self-pay | Admitting: Psychiatry

## 2023-01-26 DIAGNOSIS — R051 Acute cough: Secondary | ICD-10-CM | POA: Diagnosis not present

## 2023-01-26 DIAGNOSIS — F319 Bipolar disorder, unspecified: Secondary | ICD-10-CM | POA: Diagnosis not present

## 2023-01-26 DIAGNOSIS — D649 Anemia, unspecified: Secondary | ICD-10-CM | POA: Diagnosis not present

## 2023-01-26 DIAGNOSIS — E539 Vitamin B deficiency, unspecified: Secondary | ICD-10-CM | POA: Diagnosis not present

## 2023-01-26 DIAGNOSIS — F03C18 Unspecified dementia, severe, with other behavioral disturbance: Secondary | ICD-10-CM | POA: Diagnosis not present

## 2023-01-26 DIAGNOSIS — R5383 Other fatigue: Secondary | ICD-10-CM | POA: Diagnosis not present

## 2023-01-26 DIAGNOSIS — R0989 Other specified symptoms and signs involving the circulatory and respiratory systems: Secondary | ICD-10-CM | POA: Diagnosis not present

## 2023-01-26 DIAGNOSIS — F0390 Unspecified dementia without behavioral disturbance: Secondary | ICD-10-CM | POA: Diagnosis not present

## 2023-01-27 DIAGNOSIS — R627 Adult failure to thrive: Secondary | ICD-10-CM | POA: Diagnosis not present

## 2023-01-27 DIAGNOSIS — D649 Anemia, unspecified: Secondary | ICD-10-CM | POA: Diagnosis not present

## 2023-01-27 DIAGNOSIS — F0280 Dementia in other diseases classified elsewhere without behavioral disturbance: Secondary | ICD-10-CM | POA: Diagnosis not present

## 2023-01-27 DIAGNOSIS — R1310 Dysphagia, unspecified: Secondary | ICD-10-CM | POA: Diagnosis not present

## 2023-01-27 DIAGNOSIS — N1831 Chronic kidney disease, stage 3a: Secondary | ICD-10-CM | POA: Diagnosis not present

## 2023-01-28 DIAGNOSIS — S2249XA Multiple fractures of ribs, unspecified side, initial encounter for closed fracture: Secondary | ICD-10-CM | POA: Diagnosis not present

## 2023-01-28 DIAGNOSIS — F0280 Dementia in other diseases classified elsewhere without behavioral disturbance: Secondary | ICD-10-CM | POA: Diagnosis not present

## 2023-01-28 DIAGNOSIS — S22000A Wedge compression fracture of unspecified thoracic vertebra, initial encounter for closed fracture: Secondary | ICD-10-CM | POA: Diagnosis not present

## 2023-01-28 DIAGNOSIS — E860 Dehydration: Secondary | ICD-10-CM | POA: Diagnosis not present

## 2023-01-28 DIAGNOSIS — S060XAA Concussion with loss of consciousness status unknown, initial encounter: Secondary | ICD-10-CM | POA: Diagnosis not present

## 2023-01-28 DIAGNOSIS — R296 Repeated falls: Secondary | ICD-10-CM | POA: Diagnosis not present

## 2023-01-28 DIAGNOSIS — R1310 Dysphagia, unspecified: Secondary | ICD-10-CM | POA: Diagnosis not present

## 2023-01-28 DIAGNOSIS — I10 Essential (primary) hypertension: Secondary | ICD-10-CM | POA: Diagnosis not present

## 2023-01-30 DIAGNOSIS — F0280 Dementia in other diseases classified elsewhere without behavioral disturbance: Secondary | ICD-10-CM | POA: Diagnosis not present

## 2023-01-30 DIAGNOSIS — E860 Dehydration: Secondary | ICD-10-CM | POA: Diagnosis not present

## 2023-01-30 DIAGNOSIS — R1310 Dysphagia, unspecified: Secondary | ICD-10-CM | POA: Diagnosis not present

## 2023-01-31 DIAGNOSIS — R531 Weakness: Secondary | ICD-10-CM | POA: Diagnosis not present

## 2023-01-31 DIAGNOSIS — F0280 Dementia in other diseases classified elsewhere without behavioral disturbance: Secondary | ICD-10-CM | POA: Diagnosis not present

## 2023-01-31 DIAGNOSIS — E860 Dehydration: Secondary | ICD-10-CM | POA: Diagnosis not present

## 2023-01-31 DIAGNOSIS — Z79899 Other long term (current) drug therapy: Secondary | ICD-10-CM | POA: Diagnosis not present

## 2023-02-02 DIAGNOSIS — Z515 Encounter for palliative care: Secondary | ICD-10-CM | POA: Diagnosis not present

## 2023-02-04 DIAGNOSIS — R531 Weakness: Secondary | ICD-10-CM | POA: Diagnosis not present

## 2023-02-04 DIAGNOSIS — F03918 Unspecified dementia, unspecified severity, with other behavioral disturbance: Secondary | ICD-10-CM | POA: Diagnosis not present

## 2023-02-05 DIAGNOSIS — F03918 Unspecified dementia, unspecified severity, with other behavioral disturbance: Secondary | ICD-10-CM | POA: Diagnosis not present

## 2023-02-05 DIAGNOSIS — G20A1 Parkinson's disease without dyskinesia, without mention of fluctuations: Secondary | ICD-10-CM | POA: Diagnosis not present

## 2023-02-05 DIAGNOSIS — I69391 Dysphagia following cerebral infarction: Secondary | ICD-10-CM | POA: Diagnosis not present

## 2023-02-05 DIAGNOSIS — F3112 Bipolar disorder, current episode manic without psychotic features, moderate: Secondary | ICD-10-CM | POA: Diagnosis not present

## 2023-02-05 DIAGNOSIS — F411 Generalized anxiety disorder: Secondary | ICD-10-CM | POA: Diagnosis not present

## 2023-02-07 DIAGNOSIS — F03918 Unspecified dementia, unspecified severity, with other behavioral disturbance: Secondary | ICD-10-CM | POA: Diagnosis not present

## 2023-02-07 DIAGNOSIS — R531 Weakness: Secondary | ICD-10-CM | POA: Diagnosis not present

## 2023-02-09 ENCOUNTER — Ambulatory Visit: Payer: Medicare PPO

## 2023-02-09 DIAGNOSIS — F03918 Unspecified dementia, unspecified severity, with other behavioral disturbance: Secondary | ICD-10-CM | POA: Diagnosis not present

## 2023-02-09 DIAGNOSIS — R531 Weakness: Secondary | ICD-10-CM | POA: Diagnosis not present

## 2023-02-09 DIAGNOSIS — Z515 Encounter for palliative care: Secondary | ICD-10-CM | POA: Diagnosis not present

## 2023-02-11 DIAGNOSIS — F03918 Unspecified dementia, unspecified severity, with other behavioral disturbance: Secondary | ICD-10-CM | POA: Diagnosis not present

## 2023-02-11 DIAGNOSIS — R350 Frequency of micturition: Secondary | ICD-10-CM | POA: Diagnosis not present

## 2023-02-12 DIAGNOSIS — R350 Frequency of micturition: Secondary | ICD-10-CM | POA: Diagnosis not present

## 2023-02-12 DIAGNOSIS — F03918 Unspecified dementia, unspecified severity, with other behavioral disturbance: Secondary | ICD-10-CM | POA: Diagnosis not present

## 2023-02-12 DIAGNOSIS — Z7189 Other specified counseling: Secondary | ICD-10-CM | POA: Diagnosis not present

## 2023-02-12 DIAGNOSIS — N390 Urinary tract infection, site not specified: Secondary | ICD-10-CM | POA: Diagnosis not present

## 2023-02-14 DIAGNOSIS — R531 Weakness: Secondary | ICD-10-CM | POA: Diagnosis not present

## 2023-02-14 DIAGNOSIS — F03918 Unspecified dementia, unspecified severity, with other behavioral disturbance: Secondary | ICD-10-CM | POA: Diagnosis not present

## 2023-02-14 DIAGNOSIS — N390 Urinary tract infection, site not specified: Secondary | ICD-10-CM | POA: Diagnosis not present

## 2023-02-16 ENCOUNTER — Ambulatory Visit: Payer: Medicare PPO

## 2023-02-16 DIAGNOSIS — F03918 Unspecified dementia, unspecified severity, with other behavioral disturbance: Secondary | ICD-10-CM | POA: Diagnosis not present

## 2023-02-16 DIAGNOSIS — N390 Urinary tract infection, site not specified: Secondary | ICD-10-CM | POA: Diagnosis not present

## 2023-02-22 DIAGNOSIS — F03918 Unspecified dementia, unspecified severity, with other behavioral disturbance: Secondary | ICD-10-CM | POA: Diagnosis not present

## 2023-02-22 DIAGNOSIS — R451 Restlessness and agitation: Secondary | ICD-10-CM | POA: Diagnosis not present

## 2023-02-23 ENCOUNTER — Ambulatory Visit: Payer: Medicare PPO

## 2023-02-23 DIAGNOSIS — Z515 Encounter for palliative care: Secondary | ICD-10-CM | POA: Diagnosis not present

## 2023-02-25 DIAGNOSIS — Z5320 Procedure and treatment not carried out because of patient's decision for unspecified reasons: Secondary | ICD-10-CM | POA: Diagnosis not present

## 2023-02-25 DIAGNOSIS — R451 Restlessness and agitation: Secondary | ICD-10-CM | POA: Diagnosis not present

## 2023-02-25 DIAGNOSIS — F03918 Unspecified dementia, unspecified severity, with other behavioral disturbance: Secondary | ICD-10-CM | POA: Diagnosis not present

## 2023-02-25 DIAGNOSIS — R531 Weakness: Secondary | ICD-10-CM | POA: Diagnosis not present

## 2023-02-26 DIAGNOSIS — N390 Urinary tract infection, site not specified: Secondary | ICD-10-CM | POA: Diagnosis not present

## 2023-02-26 DIAGNOSIS — E559 Vitamin D deficiency, unspecified: Secondary | ICD-10-CM | POA: Diagnosis not present

## 2023-02-26 DIAGNOSIS — E785 Hyperlipidemia, unspecified: Secondary | ICD-10-CM | POA: Diagnosis not present

## 2023-02-27 DIAGNOSIS — E559 Vitamin D deficiency, unspecified: Secondary | ICD-10-CM | POA: Diagnosis not present

## 2023-02-27 DIAGNOSIS — I2510 Atherosclerotic heart disease of native coronary artery without angina pectoris: Secondary | ICD-10-CM | POA: Diagnosis not present

## 2023-02-27 DIAGNOSIS — F03918 Unspecified dementia, unspecified severity, with other behavioral disturbance: Secondary | ICD-10-CM | POA: Diagnosis not present

## 2023-02-27 DIAGNOSIS — Z9189 Other specified personal risk factors, not elsewhere classified: Secondary | ICD-10-CM | POA: Diagnosis not present

## 2023-02-27 DIAGNOSIS — D649 Anemia, unspecified: Secondary | ICD-10-CM | POA: Diagnosis not present

## 2023-02-27 DIAGNOSIS — N182 Chronic kidney disease, stage 2 (mild): Secondary | ICD-10-CM | POA: Diagnosis not present

## 2023-03-04 DIAGNOSIS — E43 Unspecified severe protein-calorie malnutrition: Secondary | ICD-10-CM | POA: Diagnosis not present

## 2023-03-04 DIAGNOSIS — R634 Abnormal weight loss: Secondary | ICD-10-CM | POA: Diagnosis not present

## 2023-03-04 DIAGNOSIS — R1310 Dysphagia, unspecified: Secondary | ICD-10-CM | POA: Diagnosis not present

## 2023-03-04 DIAGNOSIS — F0280 Dementia in other diseases classified elsewhere without behavioral disturbance: Secondary | ICD-10-CM | POA: Diagnosis not present

## 2023-03-05 DIAGNOSIS — G20A1 Parkinson's disease without dyskinesia, without mention of fluctuations: Secondary | ICD-10-CM | POA: Diagnosis not present

## 2023-03-05 DIAGNOSIS — F02B0 Dementia in other diseases classified elsewhere, moderate, without behavioral disturbance, psychotic disturbance, mood disturbance, and anxiety: Secondary | ICD-10-CM | POA: Diagnosis not present

## 2023-03-05 DIAGNOSIS — I69391 Dysphagia following cerebral infarction: Secondary | ICD-10-CM | POA: Diagnosis not present

## 2023-03-05 DIAGNOSIS — F411 Generalized anxiety disorder: Secondary | ICD-10-CM | POA: Diagnosis not present

## 2023-03-05 DIAGNOSIS — F3112 Bipolar disorder, current episode manic without psychotic features, moderate: Secondary | ICD-10-CM | POA: Diagnosis not present

## 2023-03-11 DIAGNOSIS — R1310 Dysphagia, unspecified: Secondary | ICD-10-CM | POA: Diagnosis not present

## 2023-03-11 DIAGNOSIS — Z9189 Other specified personal risk factors, not elsewhere classified: Secondary | ICD-10-CM | POA: Diagnosis not present

## 2023-03-11 DIAGNOSIS — F0280 Dementia in other diseases classified elsewhere without behavioral disturbance: Secondary | ICD-10-CM | POA: Diagnosis not present

## 2023-03-11 DIAGNOSIS — R634 Abnormal weight loss: Secondary | ICD-10-CM | POA: Diagnosis not present

## 2023-03-12 DIAGNOSIS — I482 Chronic atrial fibrillation, unspecified: Secondary | ICD-10-CM | POA: Diagnosis not present

## 2023-03-12 DIAGNOSIS — R1312 Dysphagia, oropharyngeal phase: Secondary | ICD-10-CM | POA: Diagnosis not present

## 2023-03-12 DIAGNOSIS — T17308A Unspecified foreign body in larynx causing other injury, initial encounter: Secondary | ICD-10-CM | POA: Diagnosis not present

## 2023-03-12 DIAGNOSIS — Z23 Encounter for immunization: Secondary | ICD-10-CM | POA: Diagnosis not present

## 2023-03-13 DIAGNOSIS — I482 Chronic atrial fibrillation, unspecified: Secondary | ICD-10-CM | POA: Diagnosis not present

## 2023-03-13 DIAGNOSIS — D649 Anemia, unspecified: Secondary | ICD-10-CM | POA: Diagnosis not present

## 2023-03-13 DIAGNOSIS — R1312 Dysphagia, oropharyngeal phase: Secondary | ICD-10-CM | POA: Diagnosis not present

## 2023-03-13 DIAGNOSIS — T17308A Unspecified foreign body in larynx causing other injury, initial encounter: Secondary | ICD-10-CM | POA: Diagnosis not present

## 2023-03-13 DIAGNOSIS — F03911 Unspecified dementia, unspecified severity, with agitation: Secondary | ICD-10-CM | POA: Diagnosis not present

## 2023-03-13 DIAGNOSIS — Z23 Encounter for immunization: Secondary | ICD-10-CM | POA: Diagnosis not present

## 2023-03-13 DIAGNOSIS — N182 Chronic kidney disease, stage 2 (mild): Secondary | ICD-10-CM | POA: Diagnosis not present

## 2023-03-13 DIAGNOSIS — N390 Urinary tract infection, site not specified: Secondary | ICD-10-CM | POA: Diagnosis not present

## 2023-03-13 DIAGNOSIS — D696 Thrombocytopenia, unspecified: Secondary | ICD-10-CM | POA: Diagnosis not present

## 2023-03-16 ENCOUNTER — Ambulatory Visit: Payer: Medicare PPO

## 2023-03-16 DIAGNOSIS — I482 Chronic atrial fibrillation, unspecified: Secondary | ICD-10-CM | POA: Diagnosis not present

## 2023-03-16 DIAGNOSIS — R1312 Dysphagia, oropharyngeal phase: Secondary | ICD-10-CM | POA: Diagnosis not present

## 2023-03-16 DIAGNOSIS — T17308A Unspecified foreign body in larynx causing other injury, initial encounter: Secondary | ICD-10-CM | POA: Diagnosis not present

## 2023-03-16 DIAGNOSIS — Z23 Encounter for immunization: Secondary | ICD-10-CM | POA: Diagnosis not present

## 2023-03-17 DIAGNOSIS — Z23 Encounter for immunization: Secondary | ICD-10-CM | POA: Diagnosis not present

## 2023-03-17 DIAGNOSIS — G4700 Insomnia, unspecified: Secondary | ICD-10-CM | POA: Diagnosis not present

## 2023-03-17 DIAGNOSIS — F0280 Dementia in other diseases classified elsewhere without behavioral disturbance: Secondary | ICD-10-CM | POA: Diagnosis not present

## 2023-03-17 DIAGNOSIS — R1310 Dysphagia, unspecified: Secondary | ICD-10-CM | POA: Diagnosis not present

## 2023-03-17 DIAGNOSIS — I482 Chronic atrial fibrillation, unspecified: Secondary | ICD-10-CM | POA: Diagnosis not present

## 2023-03-17 DIAGNOSIS — R531 Weakness: Secondary | ICD-10-CM | POA: Diagnosis not present

## 2023-03-17 DIAGNOSIS — R1312 Dysphagia, oropharyngeal phase: Secondary | ICD-10-CM | POA: Diagnosis not present

## 2023-03-17 DIAGNOSIS — T17308A Unspecified foreign body in larynx causing other injury, initial encounter: Secondary | ICD-10-CM | POA: Diagnosis not present

## 2023-03-18 DIAGNOSIS — I482 Chronic atrial fibrillation, unspecified: Secondary | ICD-10-CM | POA: Diagnosis not present

## 2023-03-18 DIAGNOSIS — R1312 Dysphagia, oropharyngeal phase: Secondary | ICD-10-CM | POA: Diagnosis not present

## 2023-03-18 DIAGNOSIS — T17308A Unspecified foreign body in larynx causing other injury, initial encounter: Secondary | ICD-10-CM | POA: Diagnosis not present

## 2023-03-18 DIAGNOSIS — Z23 Encounter for immunization: Secondary | ICD-10-CM | POA: Diagnosis not present

## 2023-03-19 DIAGNOSIS — Z23 Encounter for immunization: Secondary | ICD-10-CM | POA: Diagnosis not present

## 2023-03-19 DIAGNOSIS — I482 Chronic atrial fibrillation, unspecified: Secondary | ICD-10-CM | POA: Diagnosis not present

## 2023-03-19 DIAGNOSIS — R1312 Dysphagia, oropharyngeal phase: Secondary | ICD-10-CM | POA: Diagnosis not present

## 2023-03-19 DIAGNOSIS — T17308A Unspecified foreign body in larynx causing other injury, initial encounter: Secondary | ICD-10-CM | POA: Diagnosis not present

## 2023-03-20 DIAGNOSIS — R1312 Dysphagia, oropharyngeal phase: Secondary | ICD-10-CM | POA: Diagnosis not present

## 2023-03-20 DIAGNOSIS — I482 Chronic atrial fibrillation, unspecified: Secondary | ICD-10-CM | POA: Diagnosis not present

## 2023-03-20 DIAGNOSIS — T17308A Unspecified foreign body in larynx causing other injury, initial encounter: Secondary | ICD-10-CM | POA: Diagnosis not present

## 2023-03-20 DIAGNOSIS — Z23 Encounter for immunization: Secondary | ICD-10-CM | POA: Diagnosis not present

## 2023-03-23 ENCOUNTER — Ambulatory Visit: Payer: Medicare PPO

## 2023-03-23 DIAGNOSIS — R1312 Dysphagia, oropharyngeal phase: Secondary | ICD-10-CM | POA: Diagnosis not present

## 2023-03-23 DIAGNOSIS — T17308A Unspecified foreign body in larynx causing other injury, initial encounter: Secondary | ICD-10-CM | POA: Diagnosis not present

## 2023-03-23 DIAGNOSIS — Z23 Encounter for immunization: Secondary | ICD-10-CM | POA: Diagnosis not present

## 2023-03-23 DIAGNOSIS — I482 Chronic atrial fibrillation, unspecified: Secondary | ICD-10-CM | POA: Diagnosis not present

## 2023-03-24 DIAGNOSIS — T17308A Unspecified foreign body in larynx causing other injury, initial encounter: Secondary | ICD-10-CM | POA: Diagnosis not present

## 2023-03-24 DIAGNOSIS — Z23 Encounter for immunization: Secondary | ICD-10-CM | POA: Diagnosis not present

## 2023-03-24 DIAGNOSIS — I482 Chronic atrial fibrillation, unspecified: Secondary | ICD-10-CM | POA: Diagnosis not present

## 2023-03-24 DIAGNOSIS — R1312 Dysphagia, oropharyngeal phase: Secondary | ICD-10-CM | POA: Diagnosis not present

## 2023-03-25 DIAGNOSIS — Z23 Encounter for immunization: Secondary | ICD-10-CM | POA: Diagnosis not present

## 2023-03-25 DIAGNOSIS — T17308A Unspecified foreign body in larynx causing other injury, initial encounter: Secondary | ICD-10-CM | POA: Diagnosis not present

## 2023-03-25 DIAGNOSIS — R1312 Dysphagia, oropharyngeal phase: Secondary | ICD-10-CM | POA: Diagnosis not present

## 2023-03-25 DIAGNOSIS — I482 Chronic atrial fibrillation, unspecified: Secondary | ICD-10-CM | POA: Diagnosis not present

## 2023-03-31 DIAGNOSIS — R1310 Dysphagia, unspecified: Secondary | ICD-10-CM | POA: Diagnosis not present

## 2023-03-31 DIAGNOSIS — R051 Acute cough: Secondary | ICD-10-CM | POA: Diagnosis not present

## 2023-03-31 DIAGNOSIS — F0280 Dementia in other diseases classified elsewhere without behavioral disturbance: Secondary | ICD-10-CM | POA: Diagnosis not present

## 2023-03-31 DIAGNOSIS — R531 Weakness: Secondary | ICD-10-CM | POA: Diagnosis not present

## 2023-04-01 DIAGNOSIS — R059 Cough, unspecified: Secondary | ICD-10-CM | POA: Diagnosis not present

## 2023-04-02 DIAGNOSIS — F02B0 Dementia in other diseases classified elsewhere, moderate, without behavioral disturbance, psychotic disturbance, mood disturbance, and anxiety: Secondary | ICD-10-CM | POA: Diagnosis not present

## 2023-04-02 DIAGNOSIS — R1310 Dysphagia, unspecified: Secondary | ICD-10-CM | POA: Diagnosis not present

## 2023-04-02 DIAGNOSIS — R051 Acute cough: Secondary | ICD-10-CM | POA: Diagnosis not present

## 2023-04-02 DIAGNOSIS — Z515 Encounter for palliative care: Secondary | ICD-10-CM | POA: Diagnosis not present

## 2023-04-02 DIAGNOSIS — R531 Weakness: Secondary | ICD-10-CM | POA: Diagnosis not present

## 2023-04-02 DIAGNOSIS — G20A1 Parkinson's disease without dyskinesia, without mention of fluctuations: Secondary | ICD-10-CM | POA: Diagnosis not present

## 2023-04-02 DIAGNOSIS — F319 Bipolar disorder, unspecified: Secondary | ICD-10-CM | POA: Diagnosis not present

## 2023-04-02 DIAGNOSIS — I69391 Dysphagia following cerebral infarction: Secondary | ICD-10-CM | POA: Diagnosis not present

## 2023-04-02 DIAGNOSIS — F0280 Dementia in other diseases classified elsewhere without behavioral disturbance: Secondary | ICD-10-CM | POA: Diagnosis not present

## 2023-04-02 DIAGNOSIS — F411 Generalized anxiety disorder: Secondary | ICD-10-CM | POA: Diagnosis not present

## 2023-04-06 DIAGNOSIS — F419 Anxiety disorder, unspecified: Secondary | ICD-10-CM | POA: Diagnosis not present

## 2023-04-06 DIAGNOSIS — R600 Localized edema: Secondary | ICD-10-CM | POA: Diagnosis not present

## 2023-04-06 DIAGNOSIS — F0280 Dementia in other diseases classified elsewhere without behavioral disturbance: Secondary | ICD-10-CM | POA: Diagnosis not present

## 2023-04-06 DIAGNOSIS — G4700 Insomnia, unspecified: Secondary | ICD-10-CM | POA: Diagnosis not present

## 2023-04-06 DIAGNOSIS — M25561 Pain in right knee: Secondary | ICD-10-CM | POA: Diagnosis not present

## 2023-04-06 DIAGNOSIS — R531 Weakness: Secondary | ICD-10-CM | POA: Diagnosis not present

## 2023-04-07 DIAGNOSIS — R531 Weakness: Secondary | ICD-10-CM | POA: Diagnosis not present

## 2023-04-07 DIAGNOSIS — F0280 Dementia in other diseases classified elsewhere without behavioral disturbance: Secondary | ICD-10-CM | POA: Diagnosis not present

## 2023-04-07 DIAGNOSIS — M25561 Pain in right knee: Secondary | ICD-10-CM | POA: Diagnosis not present

## 2023-04-08 DIAGNOSIS — R531 Weakness: Secondary | ICD-10-CM | POA: Diagnosis not present

## 2023-04-08 DIAGNOSIS — F0280 Dementia in other diseases classified elsewhere without behavioral disturbance: Secondary | ICD-10-CM | POA: Diagnosis not present

## 2023-04-08 DIAGNOSIS — M25561 Pain in right knee: Secondary | ICD-10-CM | POA: Diagnosis not present

## 2023-04-08 DIAGNOSIS — R600 Localized edema: Secondary | ICD-10-CM | POA: Diagnosis not present

## 2023-04-09 DIAGNOSIS — F0280 Dementia in other diseases classified elsewhere without behavioral disturbance: Secondary | ICD-10-CM | POA: Diagnosis not present

## 2023-04-09 DIAGNOSIS — R600 Localized edema: Secondary | ICD-10-CM | POA: Diagnosis not present

## 2023-04-09 DIAGNOSIS — R531 Weakness: Secondary | ICD-10-CM | POA: Diagnosis not present

## 2023-04-10 DIAGNOSIS — F0280 Dementia in other diseases classified elsewhere without behavioral disturbance: Secondary | ICD-10-CM | POA: Diagnosis not present

## 2023-04-10 DIAGNOSIS — R531 Weakness: Secondary | ICD-10-CM | POA: Diagnosis not present

## 2023-04-10 DIAGNOSIS — J101 Influenza due to other identified influenza virus with other respiratory manifestations: Secondary | ICD-10-CM | POA: Diagnosis not present

## 2023-04-11 DIAGNOSIS — R059 Cough, unspecified: Secondary | ICD-10-CM | POA: Diagnosis not present

## 2023-04-13 DIAGNOSIS — J101 Influenza due to other identified influenza virus with other respiratory manifestations: Secondary | ICD-10-CM | POA: Diagnosis not present

## 2023-04-13 DIAGNOSIS — J189 Pneumonia, unspecified organism: Secondary | ICD-10-CM | POA: Diagnosis not present

## 2023-04-13 DIAGNOSIS — R1310 Dysphagia, unspecified: Secondary | ICD-10-CM | POA: Diagnosis not present

## 2023-04-13 DIAGNOSIS — F0280 Dementia in other diseases classified elsewhere without behavioral disturbance: Secondary | ICD-10-CM | POA: Diagnosis not present

## 2023-04-13 DIAGNOSIS — R531 Weakness: Secondary | ICD-10-CM | POA: Diagnosis not present

## 2023-04-14 DIAGNOSIS — F0280 Dementia in other diseases classified elsewhere without behavioral disturbance: Secondary | ICD-10-CM | POA: Diagnosis not present

## 2023-04-14 DIAGNOSIS — J189 Pneumonia, unspecified organism: Secondary | ICD-10-CM | POA: Diagnosis not present

## 2023-04-14 DIAGNOSIS — J101 Influenza due to other identified influenza virus with other respiratory manifestations: Secondary | ICD-10-CM | POA: Diagnosis not present

## 2023-04-14 DIAGNOSIS — R531 Weakness: Secondary | ICD-10-CM | POA: Diagnosis not present

## 2023-04-15 DIAGNOSIS — R531 Weakness: Secondary | ICD-10-CM | POA: Diagnosis not present

## 2023-04-15 DIAGNOSIS — J189 Pneumonia, unspecified organism: Secondary | ICD-10-CM | POA: Diagnosis not present

## 2023-04-15 DIAGNOSIS — J101 Influenza due to other identified influenza virus with other respiratory manifestations: Secondary | ICD-10-CM | POA: Diagnosis not present

## 2023-04-15 DIAGNOSIS — F0280 Dementia in other diseases classified elsewhere without behavioral disturbance: Secondary | ICD-10-CM | POA: Diagnosis not present

## 2023-04-16 DIAGNOSIS — F0280 Dementia in other diseases classified elsewhere without behavioral disturbance: Secondary | ICD-10-CM | POA: Diagnosis not present

## 2023-04-16 DIAGNOSIS — R531 Weakness: Secondary | ICD-10-CM | POA: Diagnosis not present

## 2023-04-16 DIAGNOSIS — J189 Pneumonia, unspecified organism: Secondary | ICD-10-CM | POA: Diagnosis not present

## 2023-04-16 DIAGNOSIS — J101 Influenza due to other identified influenza virus with other respiratory manifestations: Secondary | ICD-10-CM | POA: Diagnosis not present

## 2023-04-17 DIAGNOSIS — R531 Weakness: Secondary | ICD-10-CM | POA: Diagnosis not present

## 2023-04-17 DIAGNOSIS — F0280 Dementia in other diseases classified elsewhere without behavioral disturbance: Secondary | ICD-10-CM | POA: Diagnosis not present

## 2023-04-17 DIAGNOSIS — J189 Pneumonia, unspecified organism: Secondary | ICD-10-CM | POA: Diagnosis not present

## 2023-04-20 ENCOUNTER — Telehealth: Payer: Self-pay

## 2023-04-20 ENCOUNTER — Ambulatory Visit (INDEPENDENT_AMBULATORY_CARE_PROVIDER_SITE_OTHER): Payer: Medicare PPO

## 2023-04-20 DIAGNOSIS — R55 Syncope and collapse: Secondary | ICD-10-CM

## 2023-04-20 LAB — CUP PACEART REMOTE DEVICE CHECK
Date Time Interrogation Session: 20250217170044
Implantable Pulse Generator Implant Date: 20230925
Pulse Gen Serial Number: 511014105

## 2023-04-20 NOTE — Telephone Encounter (Signed)
ILR implant for syncope:   3 new Pause episodes,  longest 36 sec on 03/06/23 at 9:52 pm,  EGMs c/w true pauses during atrial arrhythmia with escape beats noted  LM for patient to call back and discuss.

## 2023-04-20 NOTE — Telephone Encounter (Signed)
Reviewed with Dr. Elberta Fortis. Discuss with family how aggressive they want to be given patient's progressing alzheimer's disease.  ER today for PPM placement versus wait to discuss with Dr. Graciela Husbands in office on Wednesday 2/19 at 1pm via telephone visit.   Reviewed in detail emergent concerns with having had a 36 sec true pause event. Patient risk for sudden cardiac death and need for pacemaker.  ER today for PPM work up is our primary recommendation.     Family states patient's alzheimer's is advancing and they do not, even considering risks, want to go to ER today.  Will wait for virtual visit with Dr. Graciela Husbands to discuss on Wednesday.   They are having the facility implement a DNR order per my discussion with nurse at facility - [facility]: Kathryne Sharper.     Event occurred 03/06/23 at 9:52pm. He was at a facility - no reports of symptoms or concerns that they were aware of.  He has transferred today to a new facility.    Prior to today remote monitor was not plugged in at bedside and did not capture event in real time  for [place] to respond sooner.  When plugged in today, events alerted, I contacted family immediately.   Both family and facility have been made aware of risks and concerns, including what to watch out for.  Also, aware to keep remote monitor plugged in and working at bedside at all times.

## 2023-04-21 DIAGNOSIS — G250 Essential tremor: Secondary | ICD-10-CM | POA: Diagnosis not present

## 2023-04-21 DIAGNOSIS — F319 Bipolar disorder, unspecified: Secondary | ICD-10-CM | POA: Diagnosis not present

## 2023-04-21 DIAGNOSIS — F0154 Vascular dementia, unspecified severity, with anxiety: Secondary | ICD-10-CM | POA: Diagnosis not present

## 2023-04-21 DIAGNOSIS — F0283 Dementia in other diseases classified elsewhere, unspecified severity, with mood disturbance: Secondary | ICD-10-CM | POA: Diagnosis not present

## 2023-04-21 DIAGNOSIS — G309 Alzheimer's disease, unspecified: Secondary | ICD-10-CM | POA: Diagnosis not present

## 2023-04-21 DIAGNOSIS — F419 Anxiety disorder, unspecified: Secondary | ICD-10-CM | POA: Diagnosis not present

## 2023-04-21 DIAGNOSIS — F0390 Unspecified dementia without behavioral disturbance: Secondary | ICD-10-CM | POA: Diagnosis not present

## 2023-04-21 DIAGNOSIS — I10 Essential (primary) hypertension: Secondary | ICD-10-CM | POA: Diagnosis not present

## 2023-04-21 DIAGNOSIS — I4821 Permanent atrial fibrillation: Secondary | ICD-10-CM | POA: Diagnosis not present

## 2023-04-21 DIAGNOSIS — I442 Atrioventricular block, complete: Secondary | ICD-10-CM | POA: Diagnosis not present

## 2023-04-21 DIAGNOSIS — F0153 Vascular dementia, unspecified severity, with mood disturbance: Secondary | ICD-10-CM | POA: Diagnosis not present

## 2023-04-21 DIAGNOSIS — G4700 Insomnia, unspecified: Secondary | ICD-10-CM | POA: Diagnosis not present

## 2023-04-21 DIAGNOSIS — I6990 Unspecified sequelae of unspecified cerebrovascular disease: Secondary | ICD-10-CM | POA: Diagnosis not present

## 2023-04-21 DIAGNOSIS — F0284 Dementia in other diseases classified elsewhere, unspecified severity, with anxiety: Secondary | ICD-10-CM | POA: Diagnosis not present

## 2023-04-22 ENCOUNTER — Telehealth: Payer: Self-pay

## 2023-04-22 ENCOUNTER — Encounter: Payer: Self-pay | Admitting: Internal Medicine

## 2023-04-22 ENCOUNTER — Ambulatory Visit: Payer: Medicare PPO | Attending: Internal Medicine | Admitting: Internal Medicine

## 2023-04-22 DIAGNOSIS — R55 Syncope and collapse: Secondary | ICD-10-CM

## 2023-04-22 DIAGNOSIS — E782 Mixed hyperlipidemia: Secondary | ICD-10-CM

## 2023-04-22 NOTE — Telephone Encounter (Signed)
 ..  Pt understands that although there may be some limitations with this type of visit, we will take all precautions to reduce any security or privacy concerns.  Pt understands that this will be treated like an in office visit and we will file with pt's insurance, and there may be a patient responsible charge related to this service. ? ?

## 2023-04-22 NOTE — Progress Notes (Unsigned)
Electrophysiology TeleHealth Note      Date:  04/22/2023   ID:  [name] [name], DOB 07/21/1945, MRN [id number]  Location: patient's home  Provider location: [address] [place]  Evaluation Performed: Follow-up visit  PCP:  Merri Brunette, MD  Cardiologist:   *** Electrophysiologist:  SK   Chief Complaint:  ***  History of Present Illness:   My location is [facility] [name] The Patients location is their ***. [name] [name] is a 78 y.o. male who presents via audio***/video*** conferencing for a telehealth visit today.  Since last being seen by me as an inpatient 9/23 following a syncopal episode with evidence of bradycardia some carotid hypersensitivity but not clearly associated with his events permanent atrial fibrillation and significant dementia which prompted [place] to look for further clarifying data and then seen in our clinic for an follow-up 10/23=.  Hospitalization 11/24 with couple of falls.  Cardiology was not consulted.  Monitoring strips were reviewed with evidence of tachybradycardia with heart rates in the 20s as well as in the 170s, the bradycardia had been at night the patient reports ***    The patient denies symptoms of fevers, chills, cough, or new SOB worrisome for COVID 19. ***  Past Medical History:  Diagnosis Date   Alzheimer disease (HCC)    Anxiety    Arthritis    Atrial fibrillation (HCC)    Chronic   Bipolar disorder (HCC)    Chronic low back pain    Dementia (HCC)    Depression    Dizziness    Dysrhythmia    a fib   ED (erectile dysfunction)    External hemorrhoids    Fatigue    HLD (hyperlipidemia)    HTN (hypertension)    Hypogonadism in male    Lipoma of skin    Memory loss    Mixed Alzheimer's and vascular dementia (HCC)    Poor balance    Restless legs syndrome (RLS)    Sleep apnea    cpap   Stroke (HCC) 1991   Tremor     Past Surgical History:  Procedure Laterality Date    COLONOSCOPY     KNEE ARTHROPLASTY Left 06/04/2017   Procedure: LEFT TOTAL KNEE ARTHROPLASTY WITH COMPUTER NAVIGATION;  Surgeon: Samson Frederic, MD;  Location: [facility];  Service: Orthopedics;  Laterality: Left;  NEEDS RNFA   LOOP RECORDER INSERTION N/A 11/25/2021   Procedure: LOOP RECORDER INSERTION;  Surgeon: Duke Salvia, MD;  Location: [facility] INVASIVE CV LAB;  Service: Cardiovascular;  Laterality: N/A;   TONSILLECTOMY AND ADENOIDECTOMY     TOTAL HIP ARTHROPLASTY Right 07/31/2016   Procedure: RIGHT TOTAL HIP ARTHROPLASTY ANTERIOR APPROACH;  Surgeon: Samson Frederic, MD;  Location: [facility];  Service: Orthopedics;  Laterality: Right;  Requesting RNFA    Current Outpatient Medications  Medication Sig Dispense Refill   acetaminophen (TYLENOL) 500 MG tablet Take 2 tablets (1,000 mg total) by mouth every 6 (six) hours as needed.     atorvastatin (LIPITOR) 40 MG tablet Take 1 tablet (40 mg total) by mouth every evening. Please call our office to schedule an yearly appointment with Dr. Allyson Sabal for November 2024 before anymore refills. [phone] 30 tablet 1   Azelaic Acid 15 % gel Apply 1 Application topically 2 (two) times daily. Nose & facial creases. As needed     carbidopa-levodopa (SINEMET IR) 25-250 MG tablet Take 1 tablet by mouth 2 (two) times daily. 0900 & 1700  divalproex (DEPAKOTE) 125 MG DR tablet Take 250-500 mg by mouth See admin instructions. Take 250mg  (2 tablets) by mouth every morning and 500mg  (4 tablets) every evening.     donepezil (ARICEPT) 10 MG tablet Take 1 tablet (10 mg total) by mouth at bedtime. 90 tablet 2   donepezil (ARICEPT) 10 MG tablet Take 10 mg by mouth at bedtime.     folic acid (FOLVITE) 1 MG tablet Take 1 mg by mouth at bedtime.     hydrOXYzine (ATARAX) 25 MG tablet Take 25 mg by mouth every 8 (eight) hours as needed for anxiety. X14 days.     lamoTRIgine (LAMICTAL) 150 MG tablet Take 150 mg by mouth 2 (two) times daily.     lisinopril (ZESTRIL) 10 MG tablet Take 1  tablet (10 mg total) by mouth every evening. Please call our office to schedule an yearly appointment with Dr. Allyson Sabal for November 2024 before anymore refills. [phone]. Thank you 1st attempt 30 tablet 1   memantine (NAMENDA) 10 MG tablet Take 10 mg by mouth 2 (two) times daily.     methocarbamol (ROBAXIN) 500 MG tablet Take 1 tablet (500 mg total) by mouth every 8 (eight) hours as needed for muscle spasms.     mirtazapine (REMERON) 15 MG tablet Take 15 mg by mouth at bedtime.     REPATHA SURECLICK 140 MG/ML SOAJ Inject 140 mg into the skin every 14 (fourteen) days.     No current facility-administered medications for this visit.    Allergies:   Latex and Latex   ROS:  Please see the history of present illness.   All other systems are personally reviewed and negative.    Exam:    Vital Signs:  There were no vitals taken for this visit. ***  Well appearing, alert and conversant, regular work of breathing,  good skin color Eyes- anicteric, neuro- grossly intact, skin- no apparent rash or lesions or cyanosis, mouth- oral mucosa is pink   Labs/Other Tests and Data Reviewed:    Recent Labs: 12/17/2022: TSH 5.182 01/09/2023: ALT 10 01/11/2023: Magnesium 2.0 01/16/2023: Hemoglobin 9.4; Platelets 152 01/23/2023: BUN 21; Creatinine, Ser 1.27; Potassium 4.3; Sodium 144   Wt Readings from Last 3 Encounters:  01/13/23 154 lb 15.7 oz (70.3 kg)  12/18/22 172 lb 9.9 oz (78.3 kg)  06/25/22 175 lb (79.4 kg)     Other studies personally reviewed: Additional studies/ records that were reviewed today include: ***  Review of the above records today demonstrates: *** Prior radiographs: ***    ASSESSMENT & PLAN:    ***  ***    Follow-up:  ***    Current medicines are reviewed at length with the patient today.   The patient {ACTIONS; HAS/DOES NOT HAVE:19233} concerns regarding his medicines.  The following changes were made today:  {NONE DEFAULTED:18576}  Labs/ tests ordered today  include: *** No orders of the defined types were placed in this encounter.     Today, I have spent *** minutes with the patient with telehealth technology discussing the above.  Signed, Sherryl Manges, MD  04/22/2023 12:25 PM     [facility] [address] [place] [address] [phone] (office) [phone] (fax)

## 2023-04-23 DIAGNOSIS — F419 Anxiety disorder, unspecified: Secondary | ICD-10-CM | POA: Diagnosis not present

## 2023-04-23 DIAGNOSIS — E785 Hyperlipidemia, unspecified: Secondary | ICD-10-CM | POA: Diagnosis not present

## 2023-04-23 DIAGNOSIS — I10 Essential (primary) hypertension: Secondary | ICD-10-CM | POA: Diagnosis not present

## 2023-04-23 DIAGNOSIS — G4700 Insomnia, unspecified: Secondary | ICD-10-CM | POA: Diagnosis not present

## 2023-04-23 DIAGNOSIS — F319 Bipolar disorder, unspecified: Secondary | ICD-10-CM | POA: Diagnosis not present

## 2023-04-23 NOTE — Telephone Encounter (Signed)
Called and spoke with pt evening 2/19 as part of telehealth visit -- reviewed with Dr Nelly Laurence-- done with MAC or GA and overnight stay Not sure that gains [place] a lot in this man with dementia and maybe local anesthesia and shoulder immobilizer is the safer option I tried to call the patient agina this am 2/20 0855 but no answer-- did not leave VM  Perhaps could schedule for tomorrow 2/21ds

## 2023-04-23 NOTE — Telephone Encounter (Signed)
Called and reviewed with pt's wife re above>> would favor no GA/MAC and no oernight so would proceed with single chamber active fixation lead with shoulder immobilizer for 48 hrs She is agreeable and offered her tomorrow 2/21 or next Wednesday 2/26  she will discuss with her daughter and I told her we would followup up with her later this ( before about 1400h )\

## 2023-04-23 NOTE — Telephone Encounter (Signed)
Spoke with pt's wife who states she would like to proceed with PPM for pt tomorrow 04/24/2023.  Advised will contact to review instructions once procedure is scheduled.  Pt's wife verbalizes understanding and agrees with current plan.

## 2023-04-23 NOTE — Telephone Encounter (Signed)
Spoke with Wende Mott with [facility] and reviewed instruction letter for PPM.  She states with pt needing to be at the hospital at 730am he will not receive any medications tomorrow morning nor will he be able to shower in the am.  She verbalizes understanding a of the instructions and thanked RN for the call.

## 2023-04-23 NOTE — Telephone Encounter (Signed)
Spoke with pt's wife, DPR and reviewed pt instructions for PPM implant scheduled for 04/24/2023.  Pt is admitted to a memory care facility in [place] and pt's wife asks that RN reach out to the facility to review instructions as well.  Pt's wife verbalizes understanding of instructions and agrees with current plan.

## 2023-04-24 ENCOUNTER — Ambulatory Visit (HOSPITAL_COMMUNITY): Payer: Medicare PPO

## 2023-04-24 ENCOUNTER — Observation Stay (HOSPITAL_COMMUNITY)
Admission: RE | Admit: 2023-04-24 | Discharge: 2023-04-28 | Disposition: A | Discharge status: Skilled Nursing Facility | Payer: Medicare PPO | Attending: Internal Medicine | Admitting: Internal Medicine

## 2023-04-24 ENCOUNTER — Encounter (HOSPITAL_COMMUNITY)
Admission: RE | Disposition: A | Discharge status: Home / Self Care | Payer: Medicare PPO | Source: Home / Self Care | Attending: Internal Medicine

## 2023-04-24 ENCOUNTER — Other Ambulatory Visit: Payer: Self-pay

## 2023-04-24 ENCOUNTER — Encounter (HOSPITAL_COMMUNITY): Payer: Self-pay | Admitting: Internal Medicine

## 2023-04-24 DIAGNOSIS — Z96641 Presence of right artificial hip joint: Secondary | ICD-10-CM | POA: Insufficient documentation

## 2023-04-24 DIAGNOSIS — Z8673 Personal history of transient ischemic attack (TIA), and cerebral infarction without residual deficits: Secondary | ICD-10-CM | POA: Insufficient documentation

## 2023-04-24 DIAGNOSIS — D649 Anemia, unspecified: Secondary | ICD-10-CM | POA: Diagnosis not present

## 2023-04-24 DIAGNOSIS — Z96652 Presence of left artificial knee joint: Secondary | ICD-10-CM | POA: Diagnosis not present

## 2023-04-24 DIAGNOSIS — I10 Essential (primary) hypertension: Secondary | ICD-10-CM | POA: Diagnosis not present

## 2023-04-24 DIAGNOSIS — Z79899 Other long term (current) drug therapy: Secondary | ICD-10-CM | POA: Diagnosis not present

## 2023-04-24 DIAGNOSIS — I442 Atrioventricular block, complete: Principal | ICD-10-CM | POA: Diagnosis present

## 2023-04-24 DIAGNOSIS — Z87891 Personal history of nicotine dependence: Secondary | ICD-10-CM | POA: Insufficient documentation

## 2023-04-24 DIAGNOSIS — I4821 Permanent atrial fibrillation: Secondary | ICD-10-CM | POA: Insufficient documentation

## 2023-04-24 DIAGNOSIS — R001 Bradycardia, unspecified: Secondary | ICD-10-CM | POA: Diagnosis present

## 2023-04-24 DIAGNOSIS — I517 Cardiomegaly: Secondary | ICD-10-CM | POA: Diagnosis not present

## 2023-04-24 DIAGNOSIS — Z91040 Latex allergy status: Secondary | ICD-10-CM | POA: Diagnosis not present

## 2023-04-24 DIAGNOSIS — Z950 Presence of cardiac pacemaker: Secondary | ICD-10-CM | POA: Diagnosis not present

## 2023-04-24 DIAGNOSIS — I4891 Unspecified atrial fibrillation: Secondary | ICD-10-CM | POA: Diagnosis present

## 2023-04-24 DIAGNOSIS — F03918 Unspecified dementia, unspecified severity, with other behavioral disturbance: Secondary | ICD-10-CM | POA: Diagnosis present

## 2023-04-24 DIAGNOSIS — I482 Chronic atrial fibrillation, unspecified: Principal | ICD-10-CM

## 2023-04-24 HISTORY — PX: PACEMAKER IMPLANT: EP1218

## 2023-04-24 HISTORY — DX: Presence of cardiac pacemaker: Z95.0

## 2023-04-24 LAB — CBC
HCT: 34 % — ABNORMAL LOW (ref 39.0–52.0)
Hemoglobin: 10.7 g/dL — ABNORMAL LOW (ref 13.0–17.0)
MCH: 30.1 pg (ref 26.0–34.0)
MCHC: 31.5 g/dL (ref 30.0–36.0)
MCV: 95.5 fL (ref 80.0–100.0)
Platelets: 164 10*3/uL (ref 150–400)
RBC: 3.56 MIL/uL — ABNORMAL LOW (ref 4.22–5.81)
RDW: 13.9 % (ref 11.5–15.5)
WBC: 5.1 10*3/uL (ref 4.0–10.5)
nRBC: 0 % (ref 0.0–0.2)

## 2023-04-24 LAB — IRON AND TIBC
Iron: 88 ug/dL (ref 45–182)
Saturation Ratios: 27 % (ref 17.9–39.5)
TIBC: 321 ug/dL (ref 250–450)
UIBC: 233 ug/dL

## 2023-04-24 LAB — BASIC METABOLIC PANEL
Anion gap: 7 (ref 5–15)
BUN: 13 mg/dL (ref 8–23)
CO2: 28 mmol/L (ref 22–32)
Calcium: 9.9 mg/dL (ref 8.9–10.3)
Chloride: 105 mmol/L (ref 98–111)
Creatinine, Ser: 0.99 mg/dL (ref 0.61–1.24)
GFR, Estimated: >60 mL/min (ref >60)
Glucose, Bld: 93 mg/dL (ref 70–99)
Potassium: 4.6 mmol/L (ref 3.5–5.1)
Sodium: 140 mmol/L (ref 135–145)

## 2023-04-24 SURGERY — PACEMAKER IMPLANT

## 2023-04-24 MED ORDER — DIVALPROEX SODIUM 500 MG PO DR TAB
500.0000 mg | DELAYED_RELEASE_TABLET | Freq: Every day | ORAL | Status: DC
  Administered 2023-04-25 – 2023-04-28 (×4): 500 mg via ORAL
  Filled 2023-04-24 (×5): qty 1

## 2023-04-24 MED ORDER — HEPARIN (PORCINE) IN NACL 1000-0.9 UT/500ML-% IV SOLN
INTRAVENOUS | Status: DC | PRN
  Administered 2023-04-24: 500 mL

## 2023-04-24 MED ORDER — ACETAMINOPHEN 325 MG PO TABS
650.0000 mg | ORAL_TABLET | Freq: Four times a day (QID) | ORAL | Status: DC | PRN
  Administered 2023-04-24: 650 mg via ORAL
  Filled 2023-04-24: qty 2

## 2023-04-24 MED ORDER — LIDOCAINE HCL (PF) 1 % IJ SOLN
INTRAMUSCULAR | Status: DC | PRN
  Administered 2023-04-24: 30 mL

## 2023-04-24 MED ORDER — MELATONIN 3 MG PO TABS
3.0000 mg | ORAL_TABLET | Freq: Every day | ORAL | Status: DC
  Administered 2023-04-24 – 2023-04-27 (×4): 3 mg via ORAL
  Filled 2023-04-24 (×4): qty 1

## 2023-04-24 MED ORDER — POVIDONE-IODINE 10 % EX SWAB
2.0000 | Freq: Once | CUTANEOUS | Status: AC
  Administered 2023-04-24: 2 via TOPICAL

## 2023-04-24 MED ORDER — SODIUM CHLORIDE 0.9 % IV SOLN: 80.0000 mg | INTRAVENOUS | Status: AC

## 2023-04-24 MED ORDER — SODIUM CHLORIDE 0.9 % IV SOLN
INTRAVENOUS | Status: AC
  Administered 2023-04-24: 80 mg
  Filled 2023-04-24: qty 2

## 2023-04-24 MED ORDER — CARBIDOPA-LEVODOPA 25-250 MG PO TABS
1.0000 | ORAL_TABLET | Freq: Two times a day (BID) | ORAL | Status: DC
  Administered 2023-04-24 – 2023-04-28 (×8): 1 via ORAL
  Filled 2023-04-24 (×9): qty 1

## 2023-04-24 MED ORDER — LIDOCAINE HCL (PF) 1 % IJ SOLN
INTRAMUSCULAR | Status: AC
Start: 2023-04-24 — End: ?
  Filled 2023-04-24: qty 60

## 2023-04-24 MED ORDER — MEMANTINE HCL 10 MG PO TABS
10.0000 mg | ORAL_TABLET | Freq: Two times a day (BID) | ORAL | Status: DC
  Administered 2023-04-24 – 2023-04-28 (×8): 10 mg via ORAL
  Filled 2023-04-24 (×8): qty 1

## 2023-04-24 MED ORDER — POLYETHYLENE GLYCOL 3350 17 G PO PACK: 17.0000 g | PACK | Freq: Every day | ORAL | Status: DC | PRN

## 2023-04-24 MED ORDER — DIVALPROEX SODIUM 500 MG PO DR TAB
1000.0000 mg | DELAYED_RELEASE_TABLET | Freq: Every day | ORAL | Status: DC
  Administered 2023-04-24 – 2023-04-27 (×4): 1000 mg via ORAL
  Filled 2023-04-24 (×5): qty 2

## 2023-04-24 MED ORDER — CHLORHEXIDINE GLUCONATE 4 % EX SOLN: 4.0000 | Freq: Once | CUTANEOUS | Status: DC

## 2023-04-24 MED ORDER — LAMOTRIGINE 150 MG PO TABS
150.0000 mg | ORAL_TABLET | Freq: Two times a day (BID) | ORAL | Status: DC
  Administered 2023-04-24 – 2023-04-28 (×8): 150 mg via ORAL
  Filled 2023-04-24 (×9): qty 1

## 2023-04-24 MED ORDER — CEFAZOLIN SODIUM-DEXTROSE 2-4 GM/100ML-% IV SOLN: 2.0000 g | INTRAVENOUS | Status: AC

## 2023-04-24 MED ORDER — DONEPEZIL HCL 10 MG PO TABS
10.0000 mg | ORAL_TABLET | Freq: Every day | ORAL | Status: DC
  Administered 2023-04-24 – 2023-04-27 (×4): 10 mg via ORAL
  Filled 2023-04-24 (×4): qty 1

## 2023-04-24 MED ORDER — LISINOPRIL 10 MG PO TABS
10.0000 mg | ORAL_TABLET | Freq: Every evening | ORAL | Status: DC
  Administered 2023-04-24 – 2023-04-27 (×4): 10 mg via ORAL
  Filled 2023-04-24 (×4): qty 1

## 2023-04-24 MED ORDER — CEFAZOLIN SODIUM-DEXTROSE 2-4 GM/100ML-% IV SOLN
INTRAVENOUS | Status: AC
  Administered 2023-04-24: 2 g via INTRAVENOUS
  Filled 2023-04-24: qty 100

## 2023-04-24 MED ORDER — ONDANSETRON HCL 4 MG/2ML IJ SOLN: 4.0000 mg | Freq: Four times a day (QID) | INTRAMUSCULAR | Status: DC | PRN

## 2023-04-24 MED ORDER — FUROSEMIDE 20 MG PO TABS
20.0000 mg | ORAL_TABLET | Freq: Every day | ORAL | Status: DC
  Administered 2023-04-24 – 2023-04-28 (×5): 20 mg via ORAL
  Filled 2023-04-24 (×5): qty 1

## 2023-04-24 MED ORDER — ACETAMINOPHEN 325 MG PO TABS: 325.0000 mg | ORAL_TABLET | ORAL | Status: DC | PRN

## 2023-04-24 MED ORDER — SODIUM CHLORIDE 0.9 % IV SOLN: INTRAVENOUS | Status: DC

## 2023-04-24 MED ORDER — ACETAMINOPHEN 650 MG RE SUPP: 650.0000 mg | Freq: Four times a day (QID) | RECTAL | Status: DC | PRN

## 2023-04-24 MED ORDER — HALOPERIDOL 1 MG PO TABS
1.0000 mg | ORAL_TABLET | Freq: Four times a day (QID) | ORAL | Status: DC | PRN
Start: 2023-04-24 — End: 2023-04-28
  Filled 2023-04-24: qty 1

## 2023-04-24 MED ORDER — FOLIC ACID 1 MG PO TABS
1.0000 mg | ORAL_TABLET | Freq: Every day | ORAL | Status: DC
  Administered 2023-04-24 – 2023-04-27 (×4): 1 mg via ORAL
  Filled 2023-04-24 (×4): qty 1

## 2023-04-24 MED ORDER — DIVALPROEX SODIUM 250 MG PO DR TAB: 250.0000 mg | DELAYED_RELEASE_TABLET | ORAL | Status: DC

## 2023-04-24 MED ORDER — MIRTAZAPINE 15 MG PO TABS
15.0000 mg | ORAL_TABLET | Freq: Every day | ORAL | Status: DC
  Administered 2023-04-24 – 2023-04-27 (×4): 15 mg via ORAL
  Filled 2023-04-24 (×5): qty 1

## 2023-04-24 MED ORDER — HALOPERIDOL LACTATE 5 MG/ML IJ SOLN
1.0000 mg | Freq: Four times a day (QID) | INTRAMUSCULAR | Status: DC | PRN
Start: 2023-04-24 — End: 2023-04-28
  Administered 2023-04-25: 1 mg via INTRAMUSCULAR
  Filled 2023-04-24 (×2): qty 1

## 2023-04-24 MED ORDER — ATORVASTATIN CALCIUM 40 MG PO TABS
40.0000 mg | ORAL_TABLET | Freq: Every evening | ORAL | Status: DC
  Administered 2023-04-24 – 2023-04-27 (×4): 40 mg via ORAL
  Filled 2023-04-24 (×2): qty 1
  Filled 2023-04-24: qty 4
  Filled 2023-04-24: qty 1

## 2023-04-24 SURGICAL SUPPLY — 8 items
CABLE SURGICAL S-101-97-12 (CABLE) ×2 IMPLANT
HEMOSTAT SURGICEL 2X4 FIBR (HEMOSTASIS) IMPLANT
IPG PACE AZUR XT SR MRI W1SR01 (Pacemaker) IMPLANT
LEAD CAPSURE NOVUS 5076-58CM (Lead) IMPLANT
PACE AZURE XT SR MRI W1SR01 (Pacemaker) ×1 IMPLANT
PAD DEFIB RADIO PHYSIO CONN (PAD) ×2 IMPLANT
SHEATH 7FR PRELUDE SNAP 13 (SHEATH) IMPLANT
TRAY PACEMAKER INSERTION (PACKS) ×2 IMPLANT

## 2023-04-24 NOTE — H&P (Signed)
Patient Care Team: System, Provider Not In as PCP - General Runell Gess, MD as PCP - Cardiology (Cardiology) Duke Salvia, MD as PCP - Electrophysiology (Cardiology) Merri Brunette, MD (Internal Medicine)   HPI  LEEUM [name] [name] is a 78 y.o. male w hx of recurrent falls dementia permanent atrial fibrillation with variable but some slow rates with prior recommendation for pacing but deferred in lieu of loop recorder 11/24 and 1/25 prolonged > 20 sec pauses with falls and hospitalization in 10/24   No chest pain No shortness of breath  Permanent resident at memory facility   Date Cr K Hgb  11/24 1.27 4.3 9.4<<12.2           Records and Results Reviewed labs pending   Past Medical History:  Diagnosis Date   Alzheimer disease (HCC)    Anxiety    Arthritis    Atrial fibrillation (HCC)    Chronic   Bipolar disorder (HCC)    Chronic low back pain    Dementia (HCC)    Depression    Dizziness    Dysrhythmia    a fib   ED (erectile dysfunction)    External hemorrhoids    Fatigue    HLD (hyperlipidemia)    HTN (hypertension)    Hypogonadism in male    Lipoma of skin    Memory loss    Mixed Alzheimer's and vascular dementia (HCC)    Poor balance    Restless legs syndrome (RLS)    Sleep apnea    cpap   Stroke (HCC) 1991   Tremor     Past Surgical History:  Procedure Laterality Date   COLONOSCOPY     KNEE ARTHROPLASTY Left 06/04/2017   Procedure: LEFT TOTAL KNEE ARTHROPLASTY WITH COMPUTER NAVIGATION;  Surgeon: Samson Frederic, MD;  Location: [facility];  Service: Orthopedics;  Laterality: Left;  NEEDS RNFA   LOOP RECORDER INSERTION N/A 11/25/2021   Procedure: LOOP RECORDER INSERTION;  Surgeon: Duke Salvia, MD;  Location: [facility] INVASIVE CV LAB;  Service: Cardiovascular;  Laterality: N/A;   TONSILLECTOMY AND ADENOIDECTOMY     TOTAL HIP ARTHROPLASTY Right 07/31/2016   Procedure: RIGHT TOTAL HIP ARTHROPLASTY ANTERIOR APPROACH;  Surgeon: Samson Frederic, MD;   Location: [facility];  Service: Orthopedics;  Laterality: Right;  Requesting RNFA    Current Facility-Administered Medications  Medication Dose Route Frequency Provider Last Rate Last Admin   0.9 %  sodium chloride infusion   Intravenous Continuous Duke Salvia, MD       ceFAZolin (ANCEF) IVPB 2g/100 mL premix  2 g Intravenous On Call Duke Salvia, MD       chlorhexidine (HIBICLENS) 4 % liquid 4 Application  4 Application Topical Once Duke Salvia, MD       gentamicin (GARAMYCIN) 80 mg in sodium chloride 0.9 % 500 mL irrigation  80 mg Irrigation On Call Duke Salvia, MD       povidone-iodine 10 % swab 2 Application  2 Application Topical Once Duke Salvia, MD        Allergies  Allergen Reactions   Latex Rash and Other (See Comments)    Reaction to knee wraps      Social History   Tobacco Use   Smoking status: Never   Smokeless tobacco: Former    Quit date: 07/23/1968  Vaping Use   Vaping status: Never Used  Substance Use Topics   Alcohol use: Yes    Alcohol/week:  1.0 standard drink of alcohol    Types: 1 Shots of liquor per week    Comment: occasional   Drug use: No     Family History  Problem Relation Age of Onset   Suicidality Unknown    Cancer Unknown    Alcohol abuse Unknown    Cancer Mother    Suicidality Father      Current Meds  Medication Sig   acetaminophen (TYLENOL) 325 MG tablet Take 650 mg by mouth in the morning and at bedtime.   atorvastatin (LIPITOR) 40 MG tablet Take 1 tablet (40 mg total) by mouth every evening. Please call our office to schedule an yearly appointment with Dr. Allyson Sabal for November 2024 before anymore refills. [phone]   Azelaic Acid 15 % gel Apply 1 Application topically 2 (two) times daily. Nose & facial creases.   carbidopa-levodopa (SINEMET IR) 25-250 MG tablet Take 1 tablet by mouth 2 (two) times daily.   divalproex (DEPAKOTE) 125 MG DR tablet Take 250-500 mg by mouth See admin instructions. Take 250mg  (2 tablets) by  mouth every morning and 500mg  (4 tablets) every evening.   donepezil (ARICEPT) 10 MG tablet Take 10 mg by mouth at bedtime.   folic acid (FOLVITE) 1 MG tablet Take 1 mg by mouth at bedtime.   furosemide (LASIX) 20 MG tablet Take 20 mg by mouth daily.   hydrOXYzine (ATARAX) 25 MG tablet Take 25 mg by mouth every 6 (six) hours as needed for anxiety or itching. X14 days.   lamoTRIgine (LAMICTAL) 150 MG tablet Take 150 mg by mouth 2 (two) times daily.   lisinopril (ZESTRIL) 10 MG tablet Take 1 tablet (10 mg total) by mouth every evening. Please call our office to schedule an yearly appointment with Dr. Allyson Sabal for November 2024 before anymore refills. [phone]. Thank you 1st attempt   melatonin 3 MG TABS tablet Take 3 mg by mouth at bedtime.   memantine (NAMENDA) 10 MG tablet Take 10 mg by mouth 2 (two) times daily.   mirtazapine (REMERON) 15 MG tablet Take 15 mg by mouth at bedtime.   REPATHA SURECLICK 140 MG/ML SOAJ Inject 140 mg into the skin every 14 (fourteen) days.     Review of Systems negative except from HPI and PMH  Physical Exam BP (!) 170/83   Pulse 60   Temp 97.7 F (36.5 C) (Oral)   Resp 17   Ht 5\' 11"  (1.803 m)   Wt 78.5 kg   SpO2 100%   BMI 24.13 kg/m  Well developed and well nourished in no acute distress HENT normal E scleral and icterus clear Neck Supplel Clear to ausculation Irregular rate and rhythm, no murmurs gallops or rub Soft with active bowel sounds No clubbing cyanosis  Edema Alert  x 1 grossly normal motor and sensory function Skin Warm and Dry    Assessment and  Plan Syncope   Implantable loop recorder   Pause-40 seconds or so   Atrial fibrillation-permanent   Falls-recurrent  Anemia     The benefits and risks were reviewed including but not limited to death,  perforation, infection, lead dislodgement and device malfunction.  The patient understands agrees and is willing to proceed.  Hope to prevent falls as well   Will check Hgb   and ferritin Fe/TIBC

## 2023-04-24 NOTE — Progress Notes (Signed)
Patient extremely agitated, very difficult to control. Attempting to get out of bed. Will not adhere to arm brace and pacemaker precautions. Verbally aggressive . Wife at bedside, states she will not stay. Discussed with on call provider. Without intervention it is very difficult to keep patient safe. Orders obtained

## 2023-04-24 NOTE — Discharge Instructions (Addendum)
 After Your Pacemaker   You have a Medtronic Pacemaker  ACTIVITY You should remove your sling 24 hours after your procedure, unless otherwise instructed by your provider.   Please attempt to limit large movements and heavy lifting with your affected arm while you are healing.   You may drive AFTER your wound check, unless you have been told otherwise by your provider.   Ask your healthcare provider when you can go back to work   INCISION/Dressing If you are on a blood thinner such as Coumadin, Xarelto, Eliquis, Plavix, or Pradaxa please confirm with your provider when this should be resumed.   If large square, outer bandage is left in place, this can be removed after 24 hours from your procedure. Do not remove steri-strips or glue as below.   If a PRESSURE DRESSING (a bulky dressing that usually goes up over your shoulder) was applied or left in place, please follow instructions given by your provider on when to return to have this removed.   Monitor your Pacemaker site for redness, swelling, and drainage. Call the device clinic at [phone] if you experience these symptoms or fever/chills.  If your incision is sealed with Steri-strips or staples, you may shower 7 days after your procedure or when told by your provider. Do not remove the steri-strips or let the shower hit directly on your site. You may wash around your site with soap and water.    If you were discharged in a sling, please do not wear this during the day more than 48 hours after your surgery unless otherwise instructed. This may increase the risk of stiffness and soreness in your shoulder.   Avoid lotions, ointments, or perfumes over your incision until it is well-healed.  You may use a hot tub or a pool AFTER your wound check appointment if the incision is completely closed.  Pacemaker Alerts:  Some alerts are vibratory and others beep. These are NOT emergencies. Please call our office to let us know. If this occurs  at night or on weekends, it can wait until the next business day. Send a remote transmission.  If your device is capable of reading fluid status (for heart failure), you will be offered monthly monitoring to review this with you.   DEVICE MANAGEMENT Remote monitoring is used to monitor your pacemaker from home. This monitoring is scheduled every 91 days by our office. It allows [place] to keep an eye on the functioning of your device to ensure it is working properly. You will routinely see your Electrophysiologist annually (more often if necessary).   You should receive your ID card for your new device in 4-8 weeks. Keep this card with you at all times once received. Consider wearing a medical alert bracelet or necklace.  Your Pacemaker may be MRI compatible. This will be discussed at your next office visit/wound check.  You should avoid contact with strong electric or magnetic fields.   Do not use amateur (ham) radio equipment or electric (arc) welding torches. MP3 player headphones with magnets should not be used. Some devices are safe to use if held at least 12 inches (30 cm) from your Pacemaker. These include power tools, lawn mowers, and speakers. If you are unsure if something is safe to use, ask your health care provider.  When using your cell phone, hold it to the ear that is on the opposite side from the Pacemaker. Do not leave your cell phone in a pocket over the Pacemaker.  You may  safely use electric blankets, heating pads, computers, and microwave ovens.  Call the office right away if: You have chest pain. You feel more short of breath than you have felt before. You feel more light-headed than you have felt before. Your incision starts to open up.  This information is not intended to replace advice given to you by your health care provider. Make sure you discuss any questions you have with your health care provider.

## 2023-04-24 NOTE — Progress Notes (Signed)
Received a phone call from Short Stay regarding patients return to [facility] in [place].  The facility refused to take the patient back citing they do not have nursing care to monitor him.  I spoke with the facility representative > Noelle who confirms they will not take the patient. She reviewed with her Librarian, academic.  She asked if "we could keep him for 7 days and then he could return after his arm restrictions are up".       Wife and son informed of situation.  Patient to be admitted to hospital and we will review for other facility placement options.  Home medications / admission orders placed. Confirmed full code with son over the phone.        Canary Brim, NP-C, AGACNP-BC [facility] - Electrophysiology  04/24/2023, 4:04 PM

## 2023-04-24 NOTE — Progress Notes (Deleted)
Discussed pt with admitting MD. Patient very hypertensive, BP 200's Systolic. Concerns expressed , no new orders. Per MD do not obtain another EKG. 2 Already obtained today at 3pm.

## 2023-04-25 ENCOUNTER — Encounter (HOSPITAL_COMMUNITY): Payer: Self-pay | Admitting: Internal Medicine

## 2023-04-25 DIAGNOSIS — R001 Bradycardia, unspecified: Secondary | ICD-10-CM | POA: Diagnosis present

## 2023-04-25 DIAGNOSIS — Z87891 Personal history of nicotine dependence: Secondary | ICD-10-CM | POA: Diagnosis not present

## 2023-04-25 DIAGNOSIS — Z8673 Personal history of transient ischemic attack (TIA), and cerebral infarction without residual deficits: Secondary | ICD-10-CM | POA: Diagnosis not present

## 2023-04-25 DIAGNOSIS — F03918 Unspecified dementia, unspecified severity, with other behavioral disturbance: Secondary | ICD-10-CM | POA: Diagnosis not present

## 2023-04-25 DIAGNOSIS — Z79899 Other long term (current) drug therapy: Secondary | ICD-10-CM | POA: Diagnosis not present

## 2023-04-25 DIAGNOSIS — I4891 Unspecified atrial fibrillation: Secondary | ICD-10-CM | POA: Diagnosis not present

## 2023-04-25 DIAGNOSIS — Z96641 Presence of right artificial hip joint: Secondary | ICD-10-CM | POA: Diagnosis not present

## 2023-04-25 DIAGNOSIS — I442 Atrioventricular block, complete: Secondary | ICD-10-CM | POA: Diagnosis not present

## 2023-04-25 DIAGNOSIS — I10 Essential (primary) hypertension: Secondary | ICD-10-CM | POA: Diagnosis not present

## 2023-04-25 DIAGNOSIS — Z91040 Latex allergy status: Secondary | ICD-10-CM | POA: Diagnosis not present

## 2023-04-25 LAB — BASIC METABOLIC PANEL
Anion gap: 13 (ref 5–15)
BUN: 17 mg/dL (ref 8–23)
CO2: 27 mmol/L (ref 22–32)
Calcium: 10.3 mg/dL (ref 8.9–10.3)
Chloride: 102 mmol/L (ref 98–111)
Creatinine, Ser: 1.17 mg/dL (ref 0.61–1.24)
GFR, Estimated: >60 mL/min (ref >60)
Glucose, Bld: 100 mg/dL — ABNORMAL HIGH (ref 70–99)
Potassium: 4.3 mmol/L (ref 3.5–5.1)
Sodium: 142 mmol/L (ref 135–145)

## 2023-04-25 LAB — CBC
HCT: 29.4 % — ABNORMAL LOW (ref 39.0–52.0)
Hemoglobin: 9.5 g/dL — ABNORMAL LOW (ref 13.0–17.0)
MCH: 30.2 pg (ref 26.0–34.0)
MCHC: 32.3 g/dL (ref 30.0–36.0)
MCV: 93.3 fL (ref 80.0–100.0)
Platelets: 154 10*3/uL (ref 150–400)
RBC: 3.15 MIL/uL — ABNORMAL LOW (ref 4.22–5.81)
RDW: 14 % (ref 11.5–15.5)
WBC: 7.5 10*3/uL (ref 4.0–10.5)
nRBC: 0 % (ref 0.0–0.2)

## 2023-04-25 NOTE — Progress Notes (Addendum)
 Progress Note  Patient Name: [name] [name] Date of Encounter: 04/25/2023  Primary Cardiologist: Nanetta Batty, MD   Subjective   No complaints this morning.   Inpatient Medications    Scheduled Meds:  atorvastatin  40 mg Oral QPM   carbidopa-levodopa  1 tablet Oral BID   divalproex  500 mg Oral Daily   And   divalproex  1,000 mg Oral QHS   donepezil  10 mg Oral QHS   folic acid  1 mg Oral QHS   furosemide  20 mg Oral Daily   lamoTRIgine  150 mg Oral BID   lisinopril  10 mg Oral QPM   melatonin  3 mg Oral QHS   memantine  10 mg Oral BID   mirtazapine  15 mg Oral QHS   Continuous Infusions:  PRN Meds: acetaminophen **OR** acetaminophen, haloperidol **OR** haloperidol lactate, ondansetron (ZOFRAN) IV, polyethylene glycol   Vital Signs    Vitals:   04/24/23 2323 04/25/23 0020 04/25/23 0503 04/25/23 0811  BP: 135/78  (!) 126/91 (!) 143/65  Pulse: 62  (!) 55   Resp: (!) 22  16   Temp: 98.2 F (36.8 C)  97.6 F (36.4 C)   TempSrc: Oral Axillary Oral   SpO2: 97%  99%   Weight:      Height:        Intake/Output Summary (Last 24 hours) at 04/25/2023 0943 Last data filed at 04/25/2023 0153 Gross per 24 hour  Intake --  Output 400 ml  Net -400 ml   Filed Weights   04/24/23 0801  Weight: 78.5 kg    Telemetry    Atrial fib with intermittent ventricular pacing - Personally Reviewed  ECG     Atrial fib with a controlled VR- Personally Reviewed  Physical Exam   GEN: No acute distress.   Neck: No JVD Cardiac: RRR, no murmurs, rubs, or gallops.  Respiratory: Clear to auscultation bilaterally. GI: Soft, nontender, non-distended  MS: No edema; No deformity. Neuro:  Nonfocal  Psych: Normal affect   Labs    Chemistry Recent Labs  Lab 04/24/23 0901 04/25/23 0336  NA 140 142  K 4.6 4.3  CL 105 102  CO2 28 27  GLUCOSE 93 100*  BUN 13 17  CREATININE 0.99 1.17  CALCIUM 9.9 10.3  GFRNONAA >60 >60  ANIONGAP 7 13     Hematology Recent Labs   Lab 04/24/23 0901 04/25/23 0336  WBC 5.1 7.5  RBC 3.56* 3.15*  HGB 10.7* 9.5*  HCT 34.0* 29.4*  MCV 95.5 93.3  MCH 30.1 30.2  MCHC 31.5 32.3  RDW 13.9 14.0  PLT 164 154    Cardiac EnzymesNo results for input(s): "TROPONINI" in the last 168 hours. No results for input(s): "TROPIPOC" in the last 168 hours.   BNPNo results for input(s): "BNP", "PROBNP" in the last 168 hours.   DDimer No results for input(s): "DDIMER" in the last 168 hours.   Radiology    DG Chest 2 View Result Date: 04/24/2023 CLINICAL DATA:  Cardiac device in-situ. EXAM: CHEST - 2 VIEW COMPARISON:  January 23, 2023. FINDINGS: Mild cardiomegaly is noted. Single lead left-sided pacemaker is noted in grossly good position. No pneumothorax. Both lungs are clear. The visualized skeletal structures are unremarkable. IMPRESSION: Interval placement of single lead left-sided pacemaker Electronically Signed   By: Lupita Raider M.D.   On: 04/24/2023 15:28    Cardiac Studies   Pacemaker interrogation under my direction demonstrates normal VVI PM.  Patient Profile     78 y.o. male admitted for PPM, with assisted living facility refusing to take back.   Assessment & Plan    Intermittent CHB - he is conducting with only occasional ventricular pacing.  PPM - his medtronic single chamber PPM was checked and demonstrates normal VVI PM function.  Disp - await return to assisted living. He has no need for any nursing and staying in the hospital will be much more dangerous than going back to his living situation. He can be discharged back to his usual activity with no restriction or additional intervention. Followup as scheduled.   For questions or updates, please contact [facility] Please consult [email or web address] for contact info under Cardiology/STEMI.    Signed, Lewayne Bunting, MD  04/25/2023, 9:43 AM

## 2023-04-25 NOTE — Plan of Care (Signed)
   Problem: Education: Goal: Knowledge of cardiac device and self-care will improve Outcome: Progressing Goal: Ability to safely manage health related needs after discharge will improve Outcome: Progressing Goal: Individualized Educational Video(s) Outcome: Progressing   Problem: Cardiac: Goal: Ability to achieve and maintain adequate cardiopulmonary perfusion will improve Outcome: Progressing

## 2023-04-25 NOTE — Discharge Summary (Incomplete Revision)
 Discharge Summary    Patient ID: [name] [name] MRN: [id number]; DOB: [birth year]  Admit date: 04/24/2023 Discharge date: ***  PCP:  System, Provider Not In   [facility] Providers Cardiologist:  Nanetta Batty, MD  Electrophysiologist:  Sherryl Manges, MD       Discharge Diagnoses    Principal Problem:   Intermittent complete heart block [facility]) Active Problems:   Dementia with behavioral disturbance [facility])   Atrial fibrillation (HCC)   S/P placement of cardiac pacemaker   Bradycardia   Diagnostic Studies/Procedures    EP PPM/ICD IMPLANT 04/24/2023 _____________   History of Present Illness     [name] [name] is a 78 y.o. male with a history of atrial fibrillation with slow ventricular response. He has a history of admission for collapse and noted 1-3.5 second pauses. However, because of his dementia, it was not clear that his pauses were the etiology of his collapse. He therefore had a loop recorder implanted. The loop recorder recently demonstrated several long pauses (longest 36 sec). Therefore, pacemaker implantation was arranged.   Hospital Course     Consultants: None    The patient was admitted 04/24/2023 and underwent single chamber pacemaker implantation by Dr. Graciela Husbands.  Device Information Lead: Medtronic LEAD CAPSURE NOVUS [id number]  Serial Number: [id number]   Pacemaker: Medtronic PACE [place]se [place] SR MRI [id number] Serial Number:  [id number]   The patient's facility refused to accept the patient back because of post pacemaker implantation care that is needed. Therefore, the patient was admitted overnight. He was seen by Dr. Ladona Ridgel this morning. His device demonstrated normal VVI pacing. CXR showed no pneumothorax. He is felt to be stable and ready for discharge back to his facility. He has no need for any nursing and staying in the hospital will be much more dangerous than going back to his living situation. He can be discharged back to his usual activity with  no restriction or additional intervention. We asked our social worker to review to help with placement. Several conversations were had with his facility to try to get them to accept him back. He was evaluated by PT    Did the patient have an acute coronary syndrome (MI, NSTEMI, STEMI, etc) this admission?:  No                               Did the patient have a percutaneous coronary intervention (stent / angioplasty)?:  No.       _____________  Discharge Vitals Blood pressure 130/75, pulse 65, temperature 97.7 F (36.5 C), temperature source Oral, resp. rate 18, height 5\' 11"  (1.803 m), weight 78.5 kg, SpO2 100%.  Filed Weights   04/24/23 0801  Weight: 78.5 kg    Radiologic Studies   _____________  DG Chest 2 View Result Date: 04/24/2023 CLINICAL DATA:  Cardiac device in-situ. EXAM: CHEST - 2 VIEW COMPARISON:  January 23, 2023. FINDINGS: Mild cardiomegaly is noted. Single lead left-sided pacemaker is noted in grossly good position. No pneumothorax. Both lungs are clear. The visualized skeletal structures are unremarkable. IMPRESSION: Interval placement of single lead left-sided pacemaker Electronically Signed   By: Lupita Raider M.D.   On: 04/24/2023 15:28   Disposition   Pt is being discharged to facility today in good condition.  Follow-up Plans & Appointments   On 3/6 at 4pm for Wound check at our [facility] office.    Discharge Medications  Allergies as of 04/27/2023       Reactions   Latex Rash, Other (See Comments)   Reaction to knee wraps        Medication List     TAKE these medications    acetaminophen 325 MG tablet Commonly known as: TYLENOL Take 650 mg by mouth in the morning and at bedtime.   acetaminophen 500 MG tablet Commonly known as: TYLENOL Take 2 tablets (1,000 mg total) by mouth every 6 (six) hours as needed.   atorvastatin 40 MG tablet Commonly known as: LIPITOR Take 1 tablet (40 mg total) by mouth every evening. Please call our  office to schedule an yearly appointment with Dr. Allyson Sabal for November 2024 before anymore refills. [phone]   Azelaic Acid 15 % gel Apply 1 Application topically 2 (two) times daily. Nose & facial creases.   b complex vitamins capsule Take 1 capsule by mouth daily.   carbidopa-levodopa 25-250 MG tablet Commonly known as: SINEMET IR Take 1 tablet by mouth 2 (two) times daily.   divalproex 125 MG DR tablet Commonly known as: DEPAKOTE Take 250-500 mg by mouth See admin instructions. Take 250mg  (2 tablets) by mouth every morning and 500mg  (4 tablets) every evening.   donepezil 10 MG tablet Commonly known as: ARICEPT Take 10 mg by mouth at bedtime.   folic acid 1 MG tablet Commonly known as: FOLVITE Take 1 mg by mouth at bedtime.   furosemide 20 MG tablet Commonly known as: LASIX Take 20 mg by mouth daily.   hydrOXYzine 25 MG tablet Commonly known as: ATARAX Take 25 mg by mouth every 6 (six) hours as needed for anxiety or itching. X14 days.   lamoTRIgine 150 MG tablet Commonly known as: LAMICTAL Take 150 mg by mouth 2 (two) times daily.   lisinopril 10 MG tablet Commonly known as: ZESTRIL Take 1 tablet (10 mg total) by mouth every evening. Please call our office to schedule an yearly appointment with Dr. Allyson Sabal for November 2024 before anymore refills. [phone]. Thank you 1st attempt   melatonin 3 MG Tabs tablet Take 3 mg by mouth at bedtime.   memantine 10 MG tablet Commonly known as: NAMENDA Take 10 mg by mouth 2 (two) times daily.   methocarbamol 500 MG tablet Commonly known as: ROBAXIN Take 1 tablet (500 mg total) by mouth every 8 (eight) hours as needed for muscle spasms.   mirtazapine 15 MG tablet Commonly known as: REMERON Take 15 mg by mouth at bedtime.   Repatha SureClick 140 MG/ML Soaj Generic drug: Evolocumab Inject 140 mg into the skin every 14 (fourteen) days.         Duration of Discharge Encounter: APP Time: 24 minutes    Signed, Graciella Freer, PA-C 04/27/2023, 12:11 PM

## 2023-04-25 NOTE — Evaluation (Signed)
 Physical Therapy Evaluation Patient Details Name: [name] [name] MRN: [id number] DOB: 1945/06/07 Today's Date: 04/25/2023  History of Present Illness  Patient is 78 y.o. male admitted from ALF for PPM on 04/24/23. ALF now refusing to accept pt back due to UE restrictions. PMH significant ofr alzheimers disease, anxiety, Afib, bipolar disorder, dementia, depression, HLD, HTN, Stroke in 1991, remor, previous loop recorder in 2023, Rt THA in 2018.   Clinical Impression  [name] [name] is 78 y.o. male admitted with above HPI and diagnosis. Patient is currently limited by functional impairments below (see PT problem list). Patient resident at memory care unit of ALF and is ind with no AD for mobility per chart review. Currently pt requires CGA for bed mobility and Min assist for tranfers and gait with HHA on Rt. Anticipate pt will progress mobility well and benefit from familiar environment. Pt amb ~90' with HHA and VSS throughout. Patient will benefit from continued skilled PT interventions to address impairments and progress independence with mobility, recommending return to ALF with [facility] services, if ALF cannot take pt recommend ST rehab, <3 hours/day. Acute PT will follow and progress as able.         If plan is discharge home, recommend the following: A little help with walking and/or transfers;A little help with bathing/dressing/bathroom;Assistance with cooking/housework;Direct supervision/assist for medications management;Assist for transportation;Help with stairs or ramp for entrance;Supervision due to cognitive status   Can travel by private vehicle        Equipment Recommendations  (TBD)  Recommendations for Other Services       Functional Status Assessment Patient has had a recent decline in their functional status and demonstrates the ability to make significant improvements in function in a reasonable and predictable amount of time.     Precautions / Restrictions  Precautions Precautions: Fall Recall of Precautions/Restrictions: Impaired Precaution/Restrictions Comments: s/p PPM Restrictions Weight Bearing Restrictions Per Provider Order: No Other Position/Activity Restrictions: s/p PPM, Lt UE restrictions      Mobility  Bed Mobility Overal bed mobility: Needs Assistance Bed Mobility: Supine to Sit, Sit to Supine     Supine to sit: Contact guard Sit to supine: Contact guard assist   General bed mobility comments: cues to avoid pushing with Lt UE, CGA for safety    Transfers Overall transfer level: Needs assistance Equipment used: 1 person hand held assist Transfers: Sit to/from Stand Sit to Stand: Min assist           General transfer comment: cues to avoid using Lt Ue to press up and min assist to steady with rise.    Ambulation/Gait Ambulation/Gait assistance: Min assist Gait Distance (Feet): 90 Feet Assistive device: 1 person hand held assist Gait Pattern/deviations: Step-through pattern, Decreased step length - left, Decreased stride length, Decreased dorsiflexion - left, Shuffle, Trunk flexed Gait velocity: decr     General Gait Details: mildly unsteady with gait, HHA on Rt and min assist to guide gait direction. pt with decreased Lt LE step length and dorsiflexion.  Stairs            Wheelchair Mobility     Tilt Bed    Modified Rankin (Stroke Patients Only)       Balance Overall balance assessment: Needs assistance Sitting-balance support: Feet supported Sitting balance-Leahy Scale: Good     Standing balance support: During functional activity, Single extremity supported Standing balance-Leahy Scale: Fair  Pertinent Vitals/Pain      Home Living Family/patient expects to be discharged to:: Assisted living                 Home Equipment: Agricultural consultant (2 wheels);Cane - single point;BSC/3in1;Grab bars - tub/shower Additional Comments: Per chart  review, prior to October 2024, pt lived at home with wife. Following hospitalization in October 2024, pt has been at [facility] for rehab but since then has transfer to memory care at the same facility.    Prior Function Prior Level of Function : Patient poor historian/Family not available;History of Falls (last six months);Needs assist  Cognitive Assist : Mobility (cognitive);ADLs (cognitive) Mobility (Cognitive): Set up cues ADLs (Cognitive): Step by step cues Physical Assist : ADLs (physical)   ADLs (physical): Grooming;Dressing;Bathing;Toileting;IADLs;Feeding Mobility Comments: Prior to October 2024 pt was mobilizing with no AD. pt denies use of AD for mobility, no family present to confirm. ADLs Comments: Per chart review, in October 2024, pt lived at home with wife who provided Max to Total assist with dressing, bathing, and toileting at baseline. At that time, pt able to self feed with set up and initiation cues.     Extremity/Trunk Assessment   Upper Extremity Assessment Upper Extremity Assessment:  (Lt UE restrictions for PPM)    Lower Extremity Assessment Lower Extremity Assessment: Generalized weakness (Lt LE weaker than Rt functionally)    Cervical / Trunk Assessment Cervical / Trunk Assessment: Kyphotic  Communication   Communication Communication: No apparent difficulties    Cognition Arousal: Alert Behavior During Therapy: WFL for tasks assessed/performed                             Following commands: Impaired Following commands impaired: Follows multi-step commands inconsistently, Only follows one step commands consistently     Cueing Cueing Techniques: Verbal cues     General Comments      Exercises     Assessment/Plan    PT Assessment Patient needs continued PT services  PT Problem List Decreased strength;Decreased balance;Decreased mobility;Decreased activity tolerance;Decreased cognition;Decreased safety awareness;Decreased  knowledge of precautions;Cardiopulmonary status limiting activity       PT Treatment Interventions DME instruction;Gait training;Stair training;Functional mobility training;Therapeutic activities;Therapeutic exercise;Balance training;Neuromuscular re-education;Cognitive remediation;Patient/family education    PT Goals (Current goals can be found in the Care Plan section)  Acute Rehab PT Goals Patient Stated Goal: unable to state PT Goal Formulation: Patient unable to participate in goal setting Time For Goal Achievement: 05/09/23 Potential to Achieve Goals: Good    Frequency Min 1X/week     Co-evaluation               AM-PAC PT "6 Clicks" Mobility  Outcome Measure Help needed turning from your back to your side while in a flat bed without using bedrails?: A Little Help needed moving from lying on your back to sitting on the side of a flat bed without using bedrails?: A Little Help needed moving to and from a bed to a chair (including a wheelchair)?: A Little Help needed standing up from a chair using your arms (e.g., wheelchair or bedside chair)?: A Little Help needed to walk in hospital room?: A Little Help needed climbing 3-5 steps with a railing? : A Lot 6 Click Score: 17    End of Session Equipment Utilized During Treatment: Gait belt Activity Tolerance: Patient tolerated treatment well Patient left: in bed;with call bell/phone within reach;with nursing/sitter in room Nurse  Communication: Mobility status PT Visit Diagnosis: Difficulty in walking, not elsewhere classified (R26.2);Other abnormalities of gait and mobility (R26.89);Muscle weakness (generalized) (M62.81)    Time: 1610-9604 PT Time Calculation (min) (ACUTE ONLY): 18 min   Charges:   PT Evaluation $PT Eval Moderate Complexity: 1 Mod   PT General Charges $$ ACUTE PT VISIT: 1 Visit         Wynn Maudlin, DPT Acute Rehabilitation Services Office [phone]  04/25/23 4:37 PM

## 2023-04-25 NOTE — Discharge Summary (Incomplete)
 Discharge Summary    Patient ID: [name] [name] MRN: [id number]; DOB: 17-Feb-1946  Admit date: 04/24/2023 Discharge date: 04/27/2023  PCP:  System, Provider Not In   [facility] Providers Cardiologist:  Nanetta Batty, MD  Electrophysiologist:  Sherryl Manges, MD       Discharge Diagnoses    Principal Problem:   Intermittent complete heart block [facility]) Active Problems:   Dementia with behavioral disturbance [facility])   Atrial fibrillation (HCC)   S/P placement of cardiac pacemaker   Bradycardia   Diagnostic Studies/Procedures    EP PPM/ICD IMPLANT 04/24/2023 _____________   History of Present Illness     [name] [name] is a 78 y.o. male with a history of atrial fibrillation with slow ventricular response. He has a history of admission for collapse and noted 1-3.5 second pauses. However, because of his dementia, it was not clear that his pauses were the etiology of his collapse. He therefore had a loop recorder implanted. The loop recorder recently demonstrated several long pauses (longest 36 sec). Therefore, pacemaker implantation was arranged.   Hospital Course     Consultants: None    The patient was admitted 04/24/2023 and underwent single chamber pacemaker implantation by Dr. Graciela Husbands.  Device Information Lead: Medtronic LEAD CAPSURE NOVUS [id number]  Serial Number: [id number]   Pacemaker: Medtronic PACE [place]se [place] SR MRI [id number] Serial Number:  [id number]   The patient's facility refused to accept the patient back because of post pacemaker implantation care that is needed. Therefore, the patient was admitted overnight. He was seen by Dr. Ladona Ridgel this morning. His device demonstrated normal VVI pacing. CXR showed no pneumothorax. He is felt to be stable and ready for discharge back to his facility. He has no need for any nursing and staying in the hospital will be much more dangerous than going back to his living situation. He can be discharged back to his usual activity  with no restriction or additional intervention. We asked our social worker to review to help with placement. Several conversations were had with his facility to try to get them to accept him back. He was evaluated by PT    Did the patient have an acute coronary syndrome (MI, NSTEMI, STEMI, etc) this admission?:  No                               Did the patient have a percutaneous coronary intervention (stent / angioplasty)?:  No.       _____________  Discharge Vitals Blood pressure 130/75, pulse 65, temperature 97.7 F (36.5 C), temperature source Oral, resp. rate 18, height 5\' 11"  (1.803 m), weight 78.5 kg, SpO2 100%.  Filed Weights   04/24/23 0801  Weight: 78.5 kg    Radiologic Studies   _____________  DG Chest 2 View Result Date: 04/24/2023 CLINICAL DATA:  Cardiac device in-situ. EXAM: CHEST - 2 VIEW COMPARISON:  January 23, 2023. FINDINGS: Mild cardiomegaly is noted. Single lead left-sided pacemaker is noted in grossly good position. No pneumothorax. Both lungs are clear. The visualized skeletal structures are unremarkable. IMPRESSION: Interval placement of single lead left-sided pacemaker Electronically Signed   By: Lupita Raider M.D.   On: 04/24/2023 15:28   Disposition   Pt is being discharged to facility today in good condition.  Follow-up Plans & Appointments   On 3/6 at 4pm for Wound check at our [name] office.    Discharge Medications  Allergies as of 04/27/2023       Reactions   Latex Rash, Other (See Comments)   Reaction to knee wraps        Medication List     TAKE these medications    acetaminophen 325 MG tablet Commonly known as: TYLENOL Take 650 mg by mouth in the morning and at bedtime.   acetaminophen 500 MG tablet Commonly known as: TYLENOL Take 2 tablets (1,000 mg total) by mouth every 6 (six) hours as needed.   atorvastatin 40 MG tablet Commonly known as: LIPITOR Take 1 tablet (40 mg total) by mouth every evening. Please call our  office to schedule an yearly appointment with Dr. Allyson Sabal for November 2024 before anymore refills. [phone]   Azelaic Acid 15 % gel Apply 1 Application topically 2 (two) times daily. Nose & facial creases.   b complex vitamins capsule Take 1 capsule by mouth daily.   carbidopa-levodopa 25-250 MG tablet Commonly known as: SINEMET IR Take 1 tablet by mouth 2 (two) times daily.   divalproex 125 MG DR tablet Commonly known as: DEPAKOTE Take 250-500 mg by mouth See admin instructions. Take 250mg  (2 tablets) by mouth every morning and 500mg  (4 tablets) every evening.   donepezil 10 MG tablet Commonly known as: ARICEPT Take 10 mg by mouth at bedtime.   folic acid 1 MG tablet Commonly known as: FOLVITE Take 1 mg by mouth at bedtime.   furosemide 20 MG tablet Commonly known as: LASIX Take 20 mg by mouth daily.   hydrOXYzine 25 MG tablet Commonly known as: ATARAX Take 25 mg by mouth every 6 (six) hours as needed for anxiety or itching. X14 days.   lamoTRIgine 150 MG tablet Commonly known as: LAMICTAL Take 150 mg by mouth 2 (two) times daily.   lisinopril 10 MG tablet Commonly known as: ZESTRIL Take 1 tablet (10 mg total) by mouth every evening. Please call our office to schedule an yearly appointment with Dr. Allyson Sabal for November 2024 before anymore refills. [phone]. Thank you 1st attempt   melatonin 3 MG Tabs tablet Take 3 mg by mouth at bedtime.   memantine 10 MG tablet Commonly known as: NAMENDA Take 10 mg by mouth 2 (two) times daily.   methocarbamol 500 MG tablet Commonly known as: ROBAXIN Take 1 tablet (500 mg total) by mouth every 8 (eight) hours as needed for muscle spasms.   mirtazapine 15 MG tablet Commonly known as: REMERON Take 15 mg by mouth at bedtime.   Repatha SureClick 140 MG/ML Soaj Generic drug: Evolocumab Inject 140 mg into the skin every 14 (fourteen) days.         Duration of Discharge Encounter: APP Time: 24 minutes    Signed, Graciella Freer, PA-C 04/27/2023, 12:11 PM

## 2023-04-25 NOTE — Progress Notes (Signed)
  CSW was alerted that pt's facility [facility] in [place] is refusing to take pt back due to recent pacemaker and pt  would need to be placed in another facility for 7 days. CSW explained that it would be difficult to get pt into another facility for 7 days. CSW had spoken with Teala at facility and she confirmed with Altamease Oiler that this decision has been made and we were notified yesterday.  CSW has reached out to leadership for further guidance.  CSW attempted to speak with wife and daughter and was not able to reach them.

## 2023-04-26 DIAGNOSIS — I442 Atrioventricular block, complete: Secondary | ICD-10-CM | POA: Diagnosis not present

## 2023-04-26 NOTE — Progress Notes (Addendum)
 CSW met with pt's RN Danne Harbor to discuss the updated discharge instructions. CSW called facility again and spoke with Teala and CSW let her know that the restrictions had been removed and who needed to be notify for patient to return. Teala stated that she would try to call Noelle. Teala stated that she was unable to reach [facility] and she was instructed that she could not accept pt back over the weekend.  CSW relayed updated information to leadership on call Verner Chol.  10:30am- CSW received a call back from leadership on call Verner Chol and she explained that she spoke with Teala as well and was given another person to follow up with in regards to this matter. Verner Chol stated that she was informed that it was communicated on Friday that pt would be going to a SNF and Verner Chol explained the updated information and that was not the plan. Verner Chol was informed that they had to follow up with their RCC and get back with her.

## 2023-04-26 NOTE — Progress Notes (Addendum)
 CSW spoke with pt's wife [name] to ensure that she was aware of the pt's discharge status. [name] explained that she had spoken with PA-Weaver and he explained that pt could be discharge back to the facility however facility is unwilling to accept him. [name] explained that she is not sure what to do but to wait until tomorrow. She states she may call as well.SW informed [name] that if something changes she would reach back out to her.

## 2023-04-26 NOTE — Care Management Obs Status (Signed)
 MEDICARE OBSERVATION STATUS NOTIFICATION   Patient Details  Name: BURCH MARCHUK MRN: [id number] Date of Birth: [birth year]   Medicare Observation Status Notification Given:  Yes    Lawerance Sabal, RN 04/26/2023, 2:19 PM

## 2023-04-26 NOTE — Plan of Care (Signed)

## 2023-04-26 NOTE — Progress Notes (Signed)
 CSW was alerted that pt's wife wanted to speak with CSW. [name] informed CSW that she called facility 3 times with no answers. CSW informed [name] that we have not heard back from facility either. CSW explained that it would most likely be tomorrow before further follow up could occur.  [name] informed CSW that she wants to transport pt to facility.

## 2023-04-26 NOTE — Progress Notes (Signed)
 Progress Note  Patient Name: [name] [name] Date of Encounter: 04/26/2023  Primary Cardiologist: Nanetta Batty, MD   Subjective   Remains pleasantly confused.   Inpatient Medications    Scheduled Meds:  atorvastatin  40 mg Oral QPM   carbidopa-levodopa  1 tablet Oral BID   divalproex  500 mg Oral Daily   And   divalproex  1,000 mg Oral QHS   donepezil  10 mg Oral QHS   folic acid  1 mg Oral QHS   furosemide  20 mg Oral Daily   lamoTRIgine  150 mg Oral BID   lisinopril  10 mg Oral QPM   melatonin  3 mg Oral QHS   memantine  10 mg Oral BID   mirtazapine  15 mg Oral QHS   Continuous Infusions:  PRN Meds: acetaminophen **OR** acetaminophen, haloperidol **OR** haloperidol lactate, ondansetron (ZOFRAN) IV, polyethylene glycol   Vital Signs    Vitals:   04/25/23 1832 04/25/23 2200 04/26/23 0608 04/26/23 0748  BP: (!) 149/85 (!) 145/60 (!) 148/71 123/73  Pulse:  60 62   Resp:  (!) 24 20   Temp:  97.8 F (36.6 C) 97.9 F (36.6 C) (!) 97.4 F (36.3 C)  TempSrc:  Oral Oral Oral  SpO2:  96% 98%   Weight:      Height:        Intake/Output Summary (Last 24 hours) at 04/26/2023 7829 Last data filed at 04/25/2023 1213 Gross per 24 hour  Intake --  Output 300 ml  Net -300 ml   Filed Weights   04/24/23 0801  Weight: 78.5 kg    Telemetry    Afib with intermittent pacing. - Personally Reviewed  ECG     - Personally Reviewed  Physical Exam   GEN: No acute distress.   Neck: No JVD Cardiac: IRRR, no murmurs, rubs, or gallops.  Respiratory: Clear to auscultation bilaterally. GI: Soft, nontender, non-distended  MS: No edema; No deformity. Neuro:  Nonfocal  Psych: Normal affect   Labs    Chemistry Recent Labs  Lab 04/24/23 0901 04/25/23 0336  NA 140 142  K 4.6 4.3  CL 105 102  CO2 28 27  GLUCOSE 93 100*  BUN 13 17  CREATININE 0.99 1.17  CALCIUM 9.9 10.3  GFRNONAA >60 >60  ANIONGAP 7 13     Hematology Recent Labs  Lab 04/24/23 0901  04/25/23 0336  WBC 5.1 7.5  RBC 3.56* 3.15*  HGB 10.7* 9.5*  HCT 34.0* 29.4*  MCV 95.5 93.3  MCH 30.1 30.2  MCHC 31.5 32.3  RDW 13.9 14.0  PLT 164 154    Cardiac EnzymesNo results for input(s): "TROPONINI" in the last 168 hours. No results for input(s): "TROPIPOC" in the last 168 hours.   BNPNo results for input(s): "BNP", "PROBNP" in the last 168 hours.   DDimer No results for input(s): "DDIMER" in the last 168 hours.   Radiology    DG Chest 2 View Result Date: 04/24/2023 CLINICAL DATA:  Cardiac device in-situ. EXAM: CHEST - 2 VIEW COMPARISON:  January 23, 2023. FINDINGS: Mild cardiomegaly is noted. Single lead left-sided pacemaker is noted in grossly good position. No pneumothorax. Both lungs are clear. The visualized skeletal structures are unremarkable. IMPRESSION: Interval placement of single lead left-sided pacemaker Electronically Signed   By: Lupita Raider M.D.   On: 04/24/2023 15:28    Cardiac Studies   none  Patient Profile     78 y.o. male admitted for PPM,  now having trouble with acceptance back to facility.  Assessment & Plan    Atrial fib - his VR is well controlled.  Heart block - he is s/p PPM insertion Disp - we await transfer back to assisted living.   For questions or updates, please contact [facility] Please consult [email or web address] for contact info under Cardiology/STEMI.   Signed, Lewayne Bunting, MD  04/26/2023, 9:11 AM

## 2023-04-27 DIAGNOSIS — I442 Atrioventricular block, complete: Secondary | ICD-10-CM | POA: Diagnosis not present

## 2023-04-27 NOTE — Progress Notes (Signed)
 Mobility Specialist Progress Note;   04/27/23 1450  Mobility  Activity Ambulated with assistance in hallway  Level of Assistance Contact guard assist, steadying assist  Assistive Device Front wheel walker  Distance Ambulated (ft) 250 ft  Activity Response Tolerated well  Mobility Referral Yes  Mobility visit 1 Mobility  Mobility Specialist Start Time (ACUTE ONLY) 1450  Mobility Specialist Stop Time (ACUTE ONLY) 1505  Mobility Specialist Time Calculation (min) (ACUTE ONLY) 15 min   Pt ambulating in hallway w/ NT, agreeable to hand off w/ MS. Required light MinG assistance during ambulation. Pt still displaying pleasantly confused. VSS throughout and no c/o. Pt returned safely back to room with all needs met. NT and wife in room.   Caesar Bookman Mobility Specialist Please contact via SecureChat or [employer] [phone]

## 2023-04-27 NOTE — TOC Progression Note (Signed)
 Transition of Care [facility]) - Progression Note    Patient Details  Name: [name] [name] MRN: [id number] Date of Birth: 05-Jun-1945  Transition of Care [facility]) CM/SW Contact  Delilah Shan, LCSWA Phone Number: 04/27/2023, 4:01 PM  Clinical Narrative:      CM and CSW called Noelle with [facility] in [place]. MD informed CM and CSW patient medically stable for dc. Noelle with facility informed CM and CSW that patient can return back to facility today if family gets personal care services.  Update- CM and CSW called facility back to follow up on patient possibly dcing back to facility today. Facility informed CM/CSW that patient unable to return until Thursday 2/27. CSW informed Brayton Caves with [facility] leadership.  Brayton Caves with TOC informed CSW that she spoke with Altamease Oiler with Memory care who informed Brayton Caves that facility will accept patient back tomorrow. CSW to follow up with facility in the morning. CSW called patients spouse to try and provide update. CSW unable to leave voicemail,voicemail full. CSW will try to call back when able.         Expected Discharge Plan and Services         Expected Discharge Date: 04/27/23                                     Social Determinants of Health (SDOH) Interventions SDOH Screenings   Food Insecurity: Patient Unable To Answer (04/25/2023)  Housing: Patient Unable To Answer (04/25/2023)  Transportation Needs: Patient Unable To Answer (04/25/2023)  Utilities: Patient Unable To Answer (04/25/2023)  Social Connections: Patient Unable To Answer (04/25/2023)  Tobacco Use: Medium Risk (04/25/2023)    Readmission Risk Interventions     No data to display

## 2023-04-27 NOTE — Progress Notes (Cosign Needed)
  Patient Name: [name] [name] Date of Encounter: 04/27/2023  Primary Cardiologist: Nanetta Batty, MD Electrophysiologist: Sherryl Manges, MD  Interval Summary   Discussed with wife and son earlier while pt was eating lunch pleasantly in the room. Main barrier to discharge is facility  Vital Signs    Vitals:   04/26/23 1522 04/26/23 1917 04/27/23 0413 04/27/23 0745  BP:  (!) 144/84 137/66 130/75  Pulse:  77 64 65  Resp:  16 18 18   Temp: (!) 97.4 F (36.3 C) (!) 97.4 F (36.3 C) 97.8 F (36.6 C) 97.7 F (36.5 C)  TempSrc: Oral Oral Oral Oral  SpO2:  96% 98% 100%  Weight:      Height:        Intake/Output Summary (Last 24 hours) at 04/27/2023 1555 Last data filed at 04/27/2023 1502 Gross per 24 hour  Intake 1190 ml  Output 1351 ml  Net -161 ml   Filed Weights   04/24/23 0801  Weight: 78.5 kg    Physical Exam    GEN- The patient is well appearing, alert and oriented x 3 today.   Lungs- Clear to ausculation bilaterally, normal work of breathing Cardiac- Regular rate and rhythm, no murmurs, rubs or gallops GI- soft, NT, ND, + BS Extremities- no clubbing or cyanosis. No edema  Telemetry    AF with intermittent pacing (personally reviewed)  Hospital Course    [name] [name] is a 78 y.o. male with a history of atrial fibrillation with slow ventricular response. He has a history of admission for collapse and noted 1-3.5 second pauses. However, because of his dementia, it was not clear that his pauses were the etiology of his collapse. He therefore had a loop recorder implanted. The loop recorder recently demonstrated several long pauses (longest 36 sec). Therefore, pacemaker implantation was arranged.   Assessment & Plan    Heart Block AF with slow VR S/p PPM 04/23/2022 Barrier to discharge has been facility.   With much work from [employer] today, facility has stated he has a bed tomorrow.    For questions or updates, please contact [facility] Please consult  [email or web address] for contact info under Cardiology/STEMI.  Signed, Graciella Freer, PA-C  04/27/2023, 3:55 PM

## 2023-04-27 NOTE — NC FL2 (Cosign Needed)
 [place] MEDICAID FL2 LEVEL OF CARE FORM     IDENTIFICATION  Patient Name: [name] [name] Birthdate: 13-May-1945 Sex: male Admission Date (Current Location): 04/24/2023  [facility] and [place] Number:  Producer, television/film/video and Address:  [facility] [place], [address] [place], [place] [address]      Provider Number: [id number]  Attending Physician Name and Address:  Duke Salvia, MD  Relative Name and Phone Number:  [name] (spouse) [phone]    Current Level of Care: Hospital Recommended Level of Care: Memory Care Prior Approval Number:    Date Approved/Denied:   PASRR Number:    Discharge Plan: Other (Comment) (Memory Care)    Current Diagnoses: Patient Active Problem List   Diagnosis Date Noted   Bradycardia 04/25/2023   S/P placement of cardiac pacemaker 04/24/2023   Concussion 01/09/2023   OSA (obstructive sleep apnea) 01/09/2023   CAD (coronary artery disease) of artery bypass graft 01/09/2023   Dementia with behavioral disturbance (HCC) 01/09/2023   Atrial fibrillation (HCC) 01/09/2023   Chronic anticoagulation 01/09/2023   HTN (hypertension) 01/09/2023   Bipolar disorder (HCC) 01/09/2023   Dementia with behavioral disturbance (HCC) 01/04/2023   Fall at home, initial encounter 12/18/2022   Anemia of chronic disease 12/18/2022   Generalized weakness 12/17/2022   History of loop recorder - STJ 04/15/2022   Syncope, cardiogenic 11/24/2021   Intermittent complete heart block (HCC) 11/24/2021   Obstructive sleep apnea 02/14/2021   Urinary urgency 07/11/2020   History of total knee arthroplasty 12/16/2018   Osteoarthritis of left knee 06/04/2017   B12 deficiency 03/19/2017   Loss of memory 03/18/2017   Diplopia 08/06/2016   History of atrial fibrillation 08/06/2016   History of bipolar disorder 08/06/2016   History of restless legs syndrome 08/06/2016   Weakness 02/10/2016   Essential tremor 07/17/2015   Mixed Alzheimer's and  vascular dementia (HCC) 10/28/2013   Parkinsonian features 03/31/2013   History of stroke 03/23/2013   Hyperlipidemia 03/20/2013   Brain TIA 03/18/2013   Gait instability 03/17/2013   Chronic atrial fibrillation (HCC) 03/17/2013   Essential hypertension 03/17/2013    Orientation RESPIRATION BLADDER Height & Weight     Self  Normal Incontinent Weight: 173 lb (78.5 kg) Height:  5\' 11"  (180.3 cm)  BEHAVIORAL SYMPTOMS/MOOD NEUROLOGICAL BOWEL NUTRITION STATUS      Continent Diet: Dysphagia 2 (Fine chop);Thin liquid   AMBULATORY STATUS COMMUNICATION OF NEEDS Skin   Limited Assist Verbally Other (Comment) (scratch marks,shoulder, R,Wound/Incision LDAs,Incision closed,chest,L,upper)                       Personal Care Assistance Level of Assistance  Bathing, Dressing, Feeding Bathing Assistance: Limited assistance Feeding assistance: Limited assistance (set up assist) Dressing Assistance: Limited assistance     Functional Limitations Info  Sight, Hearing, Speech Sight Info: Adequate Hearing Info: Adequate Speech Info: Adequate    SPECIAL CARE FACTORS FREQUENCY  PT (By licensed PT)     PT Frequency: Please see HH PT orders              Contractures Contractures Info: Not present    Additional Factors Info  Code Status, Allergies, Psychotropic Code Status Info: FULL Allergies Info: Latex Psychotropic Info: divalproex (DEPAKOTE) DR tablet 500 mg daily,divalproex (DEPAKOTE) DR tablet 1,000 mg daily at bedtime,donepezil (ARICEPT) tablet 10 mg daily at bedtime,lamoTRIgine (LAMICTAL) tablet 150 mg 2 times daily,melatonin tablet 3 mg daily at bedtime,memantine [facility]) tablet 10 mg 2  times daily,mirtazapine (REMERON) tablet 15 mg daily at bedtime         TAKE these medications     acetaminophen 325 MG tablet Commonly known as: TYLENOL Take 650 mg by mouth in the morning and at bedtime.    acetaminophen 500 MG tablet Commonly known as: TYLENOL Take 2 tablets  (1,000 mg total) by mouth every 6 (six) hours as needed.    atorvastatin 40 MG tablet Commonly known as: LIPITOR Take 1 tablet (40 mg total) by mouth every evening. Please call our office to schedule an yearly appointment with Dr. Allyson Sabal for November 2024 before anymore refills. [phone]    Azelaic Acid 15 % gel Apply 1 Application topically 2 (two) times daily. Nose & facial creases.    b complex vitamins capsule Take 1 capsule by mouth daily.    carbidopa-levodopa 25-250 MG tablet Commonly known as: SINEMET IR Take 1 tablet by mouth 2 (two) times daily.    divalproex 125 MG DR tablet Commonly known as: DEPAKOTE Take 250-500 mg by mouth See admin instructions. Take 250mg  (2 tablets) by mouth every morning and 500mg  (4 tablets) every evening.    donepezil 10 MG tablet Commonly known as: ARICEPT Take 10 mg by mouth at bedtime.    folic acid 1 MG tablet Commonly known as: FOLVITE Take 1 mg by mouth at bedtime.    furosemide 20 MG tablet Commonly known as: LASIX Take 20 mg by mouth daily.    hydrOXYzine 25 MG tablet Commonly known as: ATARAX Take 25 mg by mouth every 6 (six) hours as needed for anxiety or itching. X14 days.    lamoTRIgine 150 MG tablet Commonly known as: LAMICTAL Take 150 mg by mouth 2 (two) times daily.    lisinopril 10 MG tablet Commonly known as: ZESTRIL Take 1 tablet (10 mg total) by mouth every evening. Please call our office to schedule an yearly appointment with Dr. Allyson Sabal for November 2024 before anymore refills. [phone]. Thank you 1st attempt    melatonin 3 MG Tabs tablet Take 3 mg by mouth at bedtime.    memantine 10 MG tablet Commonly known as: NAMENDA Take 10 mg by mouth 2 (two) times daily.    methocarbamol 500 MG tablet Commonly known as: ROBAXIN Take 1 tablet (500 mg total) by mouth every 8 (eight) hours as needed for muscle spasms.    mirtazapine 15 MG tablet Commonly known as: REMERON Take 15 mg by mouth at bedtime.     Repatha SureClick 140 MG/ML Soaj Generic drug: Evolocumab Inject 140 mg into the skin every 14 (fourteen) days.      Relevant Imaging Results:  Relevant Lab Results:   Additional Information SSN: [id number]  Delilah Shan, LCSWA

## 2023-04-27 NOTE — TOC Progression Note (Signed)
 Transition of Care [facility]) - Progression Note    Patient Details  Name: [name] [name] MRN: [id number] Date of Birth: 07/22/1945  Transition [facility]) CM/SW Contact  Letishia Elliott, Joyice Faster, LCSW Phone Number: 04/27/2023, 3:34 PM  Clinical Narrative:     [facility] Supervisor spoke with [facility], Altamease Oiler, who continues to state that patient cannot return today.  She has spoken with Candace who states patient can return tomorrow morning.  TOC Supervisor, reiterated that patient has been medically stable and appropriate for return - she became defensive and dismissive and stated that if we need to file a formal complaint to do so.  [facility] supervisor contacted [facility], Orlean Patten who kindly agreed that this was inappropriate behavior, however could not force return today if administrator is not available.  Formal complaint filed with the county and will extend to the state if deemed necessary.  TOC staff to follow up with facility in the morning and notify supervisor if patient does not discharge.  TOC Supervisor remains available for support as needed.       Expected Discharge Plan and Services         Expected Discharge Date: 04/27/23                                     Social Determinants of Health (SDOH) Interventions SDOH Screenings   Food Insecurity: Patient Unable To Answer (04/25/2023)  Housing: Patient Unable To Answer (04/25/2023)  Transportation Needs: Patient Unable To Answer (04/25/2023)  Utilities: Patient Unable To Answer (04/25/2023)  Social Connections: Patient Unable To Answer (04/25/2023)  Tobacco Use: Medium Risk (04/25/2023)    Readmission Risk Interventions     No data to display         Macario Golds, LCSW [phone]

## 2023-04-28 DIAGNOSIS — I442 Atrioventricular block, complete: Secondary | ICD-10-CM | POA: Diagnosis not present

## 2023-04-28 DIAGNOSIS — Z79899 Other long term (current) drug therapy: Secondary | ICD-10-CM | POA: Diagnosis not present

## 2023-04-28 DIAGNOSIS — I4891 Unspecified atrial fibrillation: Secondary | ICD-10-CM | POA: Diagnosis not present

## 2023-04-28 DIAGNOSIS — Z8673 Personal history of transient ischemic attack (TIA), and cerebral infarction without residual deficits: Secondary | ICD-10-CM | POA: Diagnosis not present

## 2023-04-28 DIAGNOSIS — Z87891 Personal history of nicotine dependence: Secondary | ICD-10-CM | POA: Diagnosis not present

## 2023-04-28 DIAGNOSIS — Z91040 Latex allergy status: Secondary | ICD-10-CM | POA: Diagnosis not present

## 2023-04-28 DIAGNOSIS — I10 Essential (primary) hypertension: Secondary | ICD-10-CM | POA: Diagnosis not present

## 2023-04-28 DIAGNOSIS — F03918 Unspecified dementia, unspecified severity, with other behavioral disturbance: Secondary | ICD-10-CM | POA: Diagnosis not present

## 2023-04-28 DIAGNOSIS — Z96641 Presence of right artificial hip joint: Secondary | ICD-10-CM | POA: Diagnosis not present

## 2023-04-28 NOTE — Evaluation (Signed)
 Clinical/Bedside Swallow Evaluation Patient Details  Name: [name] [name] MRN: [id number] Date of Birth: 11-Oct-1945  Today's Date: 04/28/2023 Time: SLP Start Time (ACUTE ONLY): 0940 SLP Stop Time (ACUTE ONLY): 1000 SLP Time Calculation (min) (ACUTE ONLY): 20 min  Past Medical History:  Past Medical History:  Diagnosis Date   Alzheimer disease (HCC)    Anxiety    Arthritis    Atrial fibrillation (HCC)    Chronic   Bipolar disorder (HCC)    Chronic low back pain    Dementia (HCC)    Depression    Dizziness    Dysrhythmia    a fib   ED (erectile dysfunction)    External hemorrhoids    Fatigue    HLD (hyperlipidemia)    HTN (hypertension)    Hypogonadism in male    Lipoma of skin    Memory loss    Mixed Alzheimer's and vascular dementia (HCC)    Poor balance    Restless legs syndrome (RLS)    S/P placement of cardiac pacemaker 04/24/2023   Medtronic single lead PPM    Sleep apnea    cpap   Stroke (HCC) 1991   Tremor    Past Surgical History:  Past Surgical History:  Procedure Laterality Date   COLONOSCOPY     KNEE ARTHROPLASTY Left 06/04/2017   Procedure: LEFT TOTAL KNEE ARTHROPLASTY WITH COMPUTER NAVIGATION;  Surgeon: Samson Frederic, MD;  Location: [facility];  Service: Orthopedics;  Laterality: Left;  NEEDS RNFA   LOOP RECORDER INSERTION N/A 11/25/2021   Procedure: LOOP RECORDER INSERTION;  Surgeon: Duke Salvia, MD;  Location: [facility] INVASIVE CV LAB;  Service: Cardiovascular;  Laterality: N/A;   PACEMAKER IMPLANT N/A 04/24/2023   Procedure: PACEMAKER IMPLANT;  Surgeon: Duke Salvia, MD;  Location: [facility] INVASIVE CV LAB;  Service: Cardiovascular;  Laterality: N/A;   TONSILLECTOMY AND ADENOIDECTOMY     TOTAL HIP ARTHROPLASTY Right 07/31/2016   Procedure: RIGHT TOTAL HIP ARTHROPLASTY ANTERIOR APPROACH;  Surgeon: Samson Frederic, MD;  Location: [facility] ORS;  Service: Orthopedics;  Laterality: Right;  Requesting RNFA   HPI:  Patient is 78 y.o. male admitted from memory care unit  at ALF for PPM on 04/24/23. ALF now refusing to accept pt back due to UE restrictions. Pt was observed coughing during breakfast on 2/25 and SLP swallow eval was ordered. Pt was followed by SLP during prior hospitalization in November 2024, at which time he was recommended to have DYs 1 diet and thin liquids, which was his baseline diet due to cognitively-based oral dysphagia. His intake on this diet did fluctuate based on his mentation. PMH significant ofr alzheimers disease, anxiety, Afib, bipolar disorder, dementia, depression, HLD, HTN, Stroke in 1991, remor, previous loop recorder in 2023, Rt THA in 2018.    Assessment / Plan / Recommendation  Clinical Impression  Pt's sitter reports that he has been coughing on solid foods, sometimes taking too much in his mouth at a time. Note that in November 2024, it was reported that he is typically on a pureed diet. During today's evaluation he is sleepy and needs Mod cues for sustained attention but does initiate mastication when given small pieces of food at a time. Coughing is not observed with solids with SLP, but is observed after straw sips of thin liquids even after repositioning and slowing his pacing. He also has frequent eructation. Coughing is eliminated when SLP provided hand-over-hand assist for self-feeding small, single cup sips. Recommend adjusting diet to Dys 2 (finely  chopped) solids, continuing thin liquids by cup. SLP will f/u if he remains inpatient.  SLP Visit Diagnosis: Dysphagia, unspecified (R13.10)    Aspiration Risk       Diet Recommendation Dysphagia 2 (Fine chop);Thin liquid    Liquid Administration via: Cup Medication Administration: Crushed with puree Supervision: Staff to assist with self feeding;Full supervision/cueing for compensatory strategies Compensations: Minimize environmental distractions;Slow rate;Small sips/bites Postural Changes: Seated upright at 90 degrees;Remain upright for at least 30 minutes after po intake     Other  Recommendations Oral Care Recommendations: Oral care BID    Recommendations for follow up therapy are one component of a multi-disciplinary discharge planning process, led by the attending physician.  Recommendations may be updated based on patient status, additional functional criteria and insurance authorization.  Follow up Recommendations Home health SLP (SLP at ALF)      Assistance Recommended at Discharge    Functional Status Assessment Patient has had a recent decline in their functional status and demonstrates the ability to make significant improvements in function in a reasonable and predictable amount of time.  Frequency and Duration min 2x/week  2 weeks       Prognosis Prognosis for improved oropharyngeal function: Fair Barriers to Reach Goals: Cognitive deficits;Time post onset      Swallow Study   General HPI: Patient is 78 y.o. male admitted from memory care unit at ALF for PPM on 04/24/23. ALF now refusing to accept pt back due to UE restrictions. Pt was observed coughing during breakfast on 2/25 and SLP swallow eval was ordered. Pt was followed by SLP during prior hospitalization in November 2024, at which time he was recommended to have DYs 1 diet and thin liquids, which was his baseline diet due to cognitively-based oral dysphagia. His intake on this diet did fluctuate based on his mentation. PMH significant ofr alzheimers disease, anxiety, Afib, bipolar disorder, dementia, depression, HLD, HTN, Stroke in 1991, remor, previous loop recorder in 2023, Rt THA in 2018. Type of Study: Bedside Swallow Evaluation Previous Swallow Assessment: see HPI Diet Prior to this Study: Regular;Thin liquids (Level 0) Temperature Spikes Noted: No Respiratory Status: Room air History of Recent Intubation: No Behavior/Cognition: Lethargic/Drowsy;Cooperative;Pleasant mood;Requires cueing Oral Cavity Assessment: Within Functional Limits Oral Care Completed by SLP: No Oral Cavity -  Dentition: Adequate natural dentition Vision: Functional for self-feeding Self-Feeding Abilities: Total assist Patient Positioning: Upright in bed Baseline Vocal Quality: Normal Volitional Cough: Cognitively unable to elicit Volitional Swallow: Unable to elicit    Oral/Motor/Sensory Function Overall Oral Motor/Sensory Function: Within functional limits (did not follow all commands, but [name] for what he did do)   Ice Chips Ice chips: Not tested   Thin Liquid Thin Liquid: Impaired Presentation: Cup;Self Fed;Straw Pharyngeal  Phase Impairments: Cough - Immediate    Nectar Thick Nectar Thick Liquid: Not tested   Honey Thick Honey Thick Liquid: Not tested   Puree Puree: Within functional limits Presentation: Spoon   Solid     Solid: Impaired Oral Phase Impairments: Poor awareness of bolus Oral Phase Functional Implications: Impaired mastication      Mahala Menghini., M.A. CCC-SLP Acute Rehabilitation Services Office [phone]  Secure chat preferred  04/28/2023,10:08 AM

## 2023-04-28 NOTE — Progress Notes (Signed)
 Attempted to call report 2x. Secretary cut me off the second time.

## 2023-04-28 NOTE — TOC Progression Note (Signed)
 Transition of Care [facility]) - Progression Note    Patient Details  Name: [name] [name] MRN: [id number] Date of Birth: [birth year]  [facility]) CM/SW Contact  Delilah Shan, LCSWA Phone Number: 04/28/2023, 10:55 AM  Clinical Narrative:     CSW spoke with Altamease Oiler with [facility] who confirmed patient can dc over today. Noelle informed CSW to send over Dc summary,FL2, and [facility] PT/OT orders to Fax# [phone]. CSW will continue to follow.       Expected Discharge Plan and Services         Expected Discharge Date: 04/27/23                                     Social Determinants of Health (SDOH) Interventions SDOH Screenings   Food Insecurity: Patient Unable To Answer (04/25/2023)  Housing: Patient Unable To Answer (04/25/2023)  Transportation Needs: Patient Unable To Answer (04/25/2023)  Utilities: Patient Unable To Answer (04/25/2023)  Social Connections: Patient Unable To Answer (04/25/2023)  Tobacco Use: Medium Risk (04/25/2023)    Readmission Risk Interventions     No data to display

## 2023-04-28 NOTE — Plan of Care (Signed)

## 2023-04-28 NOTE — Progress Notes (Signed)
 [name] [name], Residence Interior and spatial designer called back to get report on patient.

## 2023-04-28 NOTE — TOC Transition Note (Addendum)
 Transition of Care [facility]) - Discharge Note   Patient Details  Name: [name] [name] MRN: [id number] Date of Birth: 1946-02-21  [facility]) CM/SW Contact:  Delilah Shan, LCSWA Phone Number: 04/28/2023, 11:11 AM   Clinical Narrative:      Patient will DC to: [facility] [place]  Anticipated DC date: 04/28/2023  Family notified: [name]   Transport by: Patients spouse [name] will transport patient to facility.  ?  Per MD patient ready for DC to [facility] [place] . RN, patient, patient's family, and facility notified of DC. Discharge Summary,FL2, and [facility] PT/OT orders sent to facility. RN given number for report [phone] if get phone tree press 2 for med tech supervisor RM# [phone]. DC packet on chart. Patients spouse will transport patient back to facility.  CSW signing off.        Patient Goals and CMS Choice            Discharge Placement                       Discharge Plan and Services Additional resources added to the After Visit Summary for                                       Social Drivers of Health (SDOH) Interventions SDOH Screenings   Food Insecurity: Patient Unable To Answer (04/25/2023)  Housing: Patient Unable To Answer (04/25/2023)  Transportation Needs: Patient Unable To Answer (04/25/2023)  Utilities: Patient Unable To Answer (04/25/2023)  Social Connections: Patient Unable To Answer (04/25/2023)  Tobacco Use: Medium Risk (04/25/2023)     Readmission Risk Interventions     No data to display

## 2023-04-30 DIAGNOSIS — F0153 Vascular dementia, unspecified severity, with mood disturbance: Secondary | ICD-10-CM | POA: Diagnosis not present

## 2023-04-30 DIAGNOSIS — G309 Alzheimer's disease, unspecified: Secondary | ICD-10-CM | POA: Diagnosis not present

## 2023-04-30 DIAGNOSIS — I442 Atrioventricular block, complete: Secondary | ICD-10-CM | POA: Diagnosis not present

## 2023-04-30 DIAGNOSIS — I10 Essential (primary) hypertension: Secondary | ICD-10-CM | POA: Diagnosis not present

## 2023-04-30 DIAGNOSIS — I4821 Permanent atrial fibrillation: Secondary | ICD-10-CM | POA: Diagnosis not present

## 2023-04-30 DIAGNOSIS — F0283 Dementia in other diseases classified elsewhere, unspecified severity, with mood disturbance: Secondary | ICD-10-CM | POA: Diagnosis not present

## 2023-04-30 DIAGNOSIS — F0284 Dementia in other diseases classified elsewhere, unspecified severity, with anxiety: Secondary | ICD-10-CM | POA: Diagnosis not present

## 2023-04-30 DIAGNOSIS — F319 Bipolar disorder, unspecified: Secondary | ICD-10-CM | POA: Diagnosis not present

## 2023-04-30 DIAGNOSIS — F0154 Vascular dementia, unspecified severity, with anxiety: Secondary | ICD-10-CM | POA: Diagnosis not present

## 2023-05-01 DIAGNOSIS — I442 Atrioventricular block, complete: Secondary | ICD-10-CM | POA: Diagnosis not present

## 2023-05-01 DIAGNOSIS — F0154 Vascular dementia, unspecified severity, with anxiety: Secondary | ICD-10-CM | POA: Diagnosis not present

## 2023-05-01 DIAGNOSIS — I4821 Permanent atrial fibrillation: Secondary | ICD-10-CM | POA: Diagnosis not present

## 2023-05-01 DIAGNOSIS — F0283 Dementia in other diseases classified elsewhere, unspecified severity, with mood disturbance: Secondary | ICD-10-CM | POA: Diagnosis not present

## 2023-05-01 DIAGNOSIS — F0153 Vascular dementia, unspecified severity, with mood disturbance: Secondary | ICD-10-CM | POA: Diagnosis not present

## 2023-05-01 DIAGNOSIS — G309 Alzheimer's disease, unspecified: Secondary | ICD-10-CM | POA: Diagnosis not present

## 2023-05-01 DIAGNOSIS — I10 Essential (primary) hypertension: Secondary | ICD-10-CM | POA: Diagnosis not present

## 2023-05-01 DIAGNOSIS — F319 Bipolar disorder, unspecified: Secondary | ICD-10-CM | POA: Diagnosis not present

## 2023-05-01 DIAGNOSIS — F0284 Dementia in other diseases classified elsewhere, unspecified severity, with anxiety: Secondary | ICD-10-CM | POA: Diagnosis not present

## 2023-05-04 DIAGNOSIS — G309 Alzheimer's disease, unspecified: Secondary | ICD-10-CM | POA: Diagnosis not present

## 2023-05-04 DIAGNOSIS — I442 Atrioventricular block, complete: Secondary | ICD-10-CM | POA: Diagnosis not present

## 2023-05-04 DIAGNOSIS — F0154 Vascular dementia, unspecified severity, with anxiety: Secondary | ICD-10-CM | POA: Diagnosis not present

## 2023-05-04 DIAGNOSIS — F0284 Dementia in other diseases classified elsewhere, unspecified severity, with anxiety: Secondary | ICD-10-CM | POA: Diagnosis not present

## 2023-05-04 DIAGNOSIS — I10 Essential (primary) hypertension: Secondary | ICD-10-CM | POA: Diagnosis not present

## 2023-05-04 DIAGNOSIS — I4821 Permanent atrial fibrillation: Secondary | ICD-10-CM | POA: Diagnosis not present

## 2023-05-04 DIAGNOSIS — F0283 Dementia in other diseases classified elsewhere, unspecified severity, with mood disturbance: Secondary | ICD-10-CM | POA: Diagnosis not present

## 2023-05-04 DIAGNOSIS — F0153 Vascular dementia, unspecified severity, with mood disturbance: Secondary | ICD-10-CM | POA: Diagnosis not present

## 2023-05-04 DIAGNOSIS — F319 Bipolar disorder, unspecified: Secondary | ICD-10-CM | POA: Diagnosis not present

## 2023-05-05 DIAGNOSIS — G4700 Insomnia, unspecified: Secondary | ICD-10-CM | POA: Diagnosis not present

## 2023-05-05 DIAGNOSIS — F419 Anxiety disorder, unspecified: Secondary | ICD-10-CM | POA: Diagnosis not present

## 2023-05-06 DIAGNOSIS — I10 Essential (primary) hypertension: Secondary | ICD-10-CM | POA: Diagnosis not present

## 2023-05-06 DIAGNOSIS — F0153 Vascular dementia, unspecified severity, with mood disturbance: Secondary | ICD-10-CM | POA: Diagnosis not present

## 2023-05-06 DIAGNOSIS — I4821 Permanent atrial fibrillation: Secondary | ICD-10-CM | POA: Diagnosis not present

## 2023-05-06 DIAGNOSIS — F319 Bipolar disorder, unspecified: Secondary | ICD-10-CM | POA: Diagnosis not present

## 2023-05-06 DIAGNOSIS — F0283 Dementia in other diseases classified elsewhere, unspecified severity, with mood disturbance: Secondary | ICD-10-CM | POA: Diagnosis not present

## 2023-05-06 DIAGNOSIS — F0154 Vascular dementia, unspecified severity, with anxiety: Secondary | ICD-10-CM | POA: Diagnosis not present

## 2023-05-06 DIAGNOSIS — G309 Alzheimer's disease, unspecified: Secondary | ICD-10-CM | POA: Diagnosis not present

## 2023-05-06 DIAGNOSIS — F0284 Dementia in other diseases classified elsewhere, unspecified severity, with anxiety: Secondary | ICD-10-CM | POA: Diagnosis not present

## 2023-05-06 DIAGNOSIS — I442 Atrioventricular block, complete: Secondary | ICD-10-CM | POA: Diagnosis not present

## 2023-05-07 ENCOUNTER — Ambulatory Visit: Payer: Medicare PPO

## 2023-05-08 DIAGNOSIS — I442 Atrioventricular block, complete: Secondary | ICD-10-CM | POA: Diagnosis not present

## 2023-05-08 DIAGNOSIS — G309 Alzheimer's disease, unspecified: Secondary | ICD-10-CM | POA: Diagnosis not present

## 2023-05-08 DIAGNOSIS — F0154 Vascular dementia, unspecified severity, with anxiety: Secondary | ICD-10-CM | POA: Diagnosis not present

## 2023-05-08 DIAGNOSIS — F0153 Vascular dementia, unspecified severity, with mood disturbance: Secondary | ICD-10-CM | POA: Diagnosis not present

## 2023-05-08 DIAGNOSIS — I10 Essential (primary) hypertension: Secondary | ICD-10-CM | POA: Diagnosis not present

## 2023-05-08 DIAGNOSIS — F0283 Dementia in other diseases classified elsewhere, unspecified severity, with mood disturbance: Secondary | ICD-10-CM | POA: Diagnosis not present

## 2023-05-08 DIAGNOSIS — I4821 Permanent atrial fibrillation: Secondary | ICD-10-CM | POA: Diagnosis not present

## 2023-05-08 DIAGNOSIS — F319 Bipolar disorder, unspecified: Secondary | ICD-10-CM | POA: Diagnosis not present

## 2023-05-08 DIAGNOSIS — F0284 Dementia in other diseases classified elsewhere, unspecified severity, with anxiety: Secondary | ICD-10-CM | POA: Diagnosis not present

## 2023-05-11 DIAGNOSIS — F0154 Vascular dementia, unspecified severity, with anxiety: Secondary | ICD-10-CM | POA: Diagnosis not present

## 2023-05-11 DIAGNOSIS — F319 Bipolar disorder, unspecified: Secondary | ICD-10-CM | POA: Diagnosis not present

## 2023-05-11 DIAGNOSIS — I4821 Permanent atrial fibrillation: Secondary | ICD-10-CM | POA: Diagnosis not present

## 2023-05-11 DIAGNOSIS — I442 Atrioventricular block, complete: Secondary | ICD-10-CM | POA: Diagnosis not present

## 2023-05-11 DIAGNOSIS — I10 Essential (primary) hypertension: Secondary | ICD-10-CM | POA: Diagnosis not present

## 2023-05-11 DIAGNOSIS — F0284 Dementia in other diseases classified elsewhere, unspecified severity, with anxiety: Secondary | ICD-10-CM | POA: Diagnosis not present

## 2023-05-11 DIAGNOSIS — G309 Alzheimer's disease, unspecified: Secondary | ICD-10-CM | POA: Diagnosis not present

## 2023-05-11 DIAGNOSIS — F0283 Dementia in other diseases classified elsewhere, unspecified severity, with mood disturbance: Secondary | ICD-10-CM | POA: Diagnosis not present

## 2023-05-11 DIAGNOSIS — F0153 Vascular dementia, unspecified severity, with mood disturbance: Secondary | ICD-10-CM | POA: Diagnosis not present

## 2023-05-12 DIAGNOSIS — I442 Atrioventricular block, complete: Secondary | ICD-10-CM | POA: Diagnosis not present

## 2023-05-12 DIAGNOSIS — F0154 Vascular dementia, unspecified severity, with anxiety: Secondary | ICD-10-CM | POA: Diagnosis not present

## 2023-05-12 DIAGNOSIS — F0153 Vascular dementia, unspecified severity, with mood disturbance: Secondary | ICD-10-CM | POA: Diagnosis not present

## 2023-05-12 DIAGNOSIS — F319 Bipolar disorder, unspecified: Secondary | ICD-10-CM | POA: Diagnosis not present

## 2023-05-12 DIAGNOSIS — I10 Essential (primary) hypertension: Secondary | ICD-10-CM | POA: Diagnosis not present

## 2023-05-12 DIAGNOSIS — F0283 Dementia in other diseases classified elsewhere, unspecified severity, with mood disturbance: Secondary | ICD-10-CM | POA: Diagnosis not present

## 2023-05-12 DIAGNOSIS — G309 Alzheimer's disease, unspecified: Secondary | ICD-10-CM | POA: Diagnosis not present

## 2023-05-12 DIAGNOSIS — F0284 Dementia in other diseases classified elsewhere, unspecified severity, with anxiety: Secondary | ICD-10-CM | POA: Diagnosis not present

## 2023-05-12 DIAGNOSIS — I4821 Permanent atrial fibrillation: Secondary | ICD-10-CM | POA: Diagnosis not present

## 2023-05-13 NOTE — Patient Instructions (Signed)
   After Your Pacemaker   Monitor your pacemaker site for redness, swelling, and drainage. Call the device clinic at [phone] if you experience these symptoms or fever/chills.  Your incision was closed with Dermabond:  You may shower 1 day after your defibrillator implant and wash your incision with soap and water. Avoid lotions, ointments, or perfumes over your incision until it is well-healed.  You may use a hot tub or a pool after your wound check appointment if the incision is completely closed.  Do not lift, push or pull greater than 10 pounds with the affected arm until 6 weeks after your procedure. UNTIL AFTER APRIL 4TH.  There are no other restrictions in arm movement after your wound check appointment.  You may drive, unless driving has been restricted by your healthcare providers.   Remote monitoring is used to monitor your pacemaker from home. This monitoring is scheduled every 91 days by our office. It allows [place] to keep an eye on the functioning of your device to ensure it is working properly. You will routinely see your Electrophysiologist annually (more often if necessary).

## 2023-05-14 ENCOUNTER — Ambulatory Visit: Attending: Cardiovascular Disease

## 2023-05-14 DIAGNOSIS — F0284 Dementia in other diseases classified elsewhere, unspecified severity, with anxiety: Secondary | ICD-10-CM | POA: Diagnosis not present

## 2023-05-14 DIAGNOSIS — F0153 Vascular dementia, unspecified severity, with mood disturbance: Secondary | ICD-10-CM | POA: Diagnosis not present

## 2023-05-14 DIAGNOSIS — F319 Bipolar disorder, unspecified: Secondary | ICD-10-CM | POA: Diagnosis not present

## 2023-05-14 DIAGNOSIS — G309 Alzheimer's disease, unspecified: Secondary | ICD-10-CM | POA: Diagnosis not present

## 2023-05-14 DIAGNOSIS — I10 Essential (primary) hypertension: Secondary | ICD-10-CM | POA: Diagnosis not present

## 2023-05-14 DIAGNOSIS — I442 Atrioventricular block, complete: Secondary | ICD-10-CM | POA: Diagnosis not present

## 2023-05-14 DIAGNOSIS — F0154 Vascular dementia, unspecified severity, with anxiety: Secondary | ICD-10-CM | POA: Diagnosis not present

## 2023-05-14 DIAGNOSIS — R55 Syncope and collapse: Secondary | ICD-10-CM

## 2023-05-14 DIAGNOSIS — R001 Bradycardia, unspecified: Secondary | ICD-10-CM

## 2023-05-14 DIAGNOSIS — F0283 Dementia in other diseases classified elsewhere, unspecified severity, with mood disturbance: Secondary | ICD-10-CM | POA: Diagnosis not present

## 2023-05-14 DIAGNOSIS — I4821 Permanent atrial fibrillation: Secondary | ICD-10-CM | POA: Diagnosis not present

## 2023-05-14 LAB — CUP PACEART INCLINIC DEVICE CHECK
Date Time Interrogation Session: 20250313202623
Implantable Lead Connection Status: 753985
Implantable Lead Implant Date: 20250221
Implantable Lead Location: 753860
Implantable Lead Model: 5076
Implantable Pulse Generator Implant Date: 20250221

## 2023-05-14 NOTE — Progress Notes (Signed)
 Normal single chamber pacemaker wound check. Presenting rhythm: VP-VS 61 . Wound well healed. Routine testing performed. Thresholds, sensing, and impedances consistent with implant measurements and at 3.5V safety margin/auto capture until 3 month visit. No episodes. Reviewed arm restrictions to continue for 6 weeks total post op.  Pt enrolled in remote follow-up.

## 2023-05-15 DIAGNOSIS — F0284 Dementia in other diseases classified elsewhere, unspecified severity, with anxiety: Secondary | ICD-10-CM | POA: Diagnosis not present

## 2023-05-15 DIAGNOSIS — I442 Atrioventricular block, complete: Secondary | ICD-10-CM | POA: Diagnosis not present

## 2023-05-15 DIAGNOSIS — I10 Essential (primary) hypertension: Secondary | ICD-10-CM | POA: Diagnosis not present

## 2023-05-15 DIAGNOSIS — F0154 Vascular dementia, unspecified severity, with anxiety: Secondary | ICD-10-CM | POA: Diagnosis not present

## 2023-05-15 DIAGNOSIS — F319 Bipolar disorder, unspecified: Secondary | ICD-10-CM | POA: Diagnosis not present

## 2023-05-15 DIAGNOSIS — F0283 Dementia in other diseases classified elsewhere, unspecified severity, with mood disturbance: Secondary | ICD-10-CM | POA: Diagnosis not present

## 2023-05-15 DIAGNOSIS — G309 Alzheimer's disease, unspecified: Secondary | ICD-10-CM | POA: Diagnosis not present

## 2023-05-15 DIAGNOSIS — F0153 Vascular dementia, unspecified severity, with mood disturbance: Secondary | ICD-10-CM | POA: Diagnosis not present

## 2023-05-15 DIAGNOSIS — I4821 Permanent atrial fibrillation: Secondary | ICD-10-CM | POA: Diagnosis not present

## 2023-05-18 DIAGNOSIS — I10 Essential (primary) hypertension: Secondary | ICD-10-CM | POA: Diagnosis not present

## 2023-05-18 DIAGNOSIS — F0154 Vascular dementia, unspecified severity, with anxiety: Secondary | ICD-10-CM | POA: Diagnosis not present

## 2023-05-18 DIAGNOSIS — F0153 Vascular dementia, unspecified severity, with mood disturbance: Secondary | ICD-10-CM | POA: Diagnosis not present

## 2023-05-18 DIAGNOSIS — F319 Bipolar disorder, unspecified: Secondary | ICD-10-CM | POA: Diagnosis not present

## 2023-05-18 DIAGNOSIS — I442 Atrioventricular block, complete: Secondary | ICD-10-CM | POA: Diagnosis not present

## 2023-05-18 DIAGNOSIS — F0283 Dementia in other diseases classified elsewhere, unspecified severity, with mood disturbance: Secondary | ICD-10-CM | POA: Diagnosis not present

## 2023-05-18 DIAGNOSIS — G309 Alzheimer's disease, unspecified: Secondary | ICD-10-CM | POA: Diagnosis not present

## 2023-05-18 DIAGNOSIS — E782 Mixed hyperlipidemia: Secondary | ICD-10-CM | POA: Diagnosis not present

## 2023-05-18 DIAGNOSIS — F0284 Dementia in other diseases classified elsewhere, unspecified severity, with anxiety: Secondary | ICD-10-CM | POA: Diagnosis not present

## 2023-05-18 DIAGNOSIS — I4821 Permanent atrial fibrillation: Secondary | ICD-10-CM | POA: Diagnosis not present

## 2023-05-19 DIAGNOSIS — F01518 Vascular dementia, unspecified severity, with other behavioral disturbance: Secondary | ICD-10-CM | POA: Diagnosis not present

## 2023-05-19 DIAGNOSIS — G26 Extrapyramidal and movement disorders in diseases classified elsewhere: Secondary | ICD-10-CM | POA: Diagnosis not present

## 2023-05-19 DIAGNOSIS — G301 Alzheimer's disease with late onset: Secondary | ICD-10-CM | POA: Diagnosis not present

## 2023-05-19 DIAGNOSIS — F319 Bipolar disorder, unspecified: Secondary | ICD-10-CM | POA: Diagnosis not present

## 2023-05-19 DIAGNOSIS — F411 Generalized anxiety disorder: Secondary | ICD-10-CM | POA: Diagnosis not present

## 2023-05-19 DIAGNOSIS — F5101 Primary insomnia: Secondary | ICD-10-CM | POA: Diagnosis not present

## 2023-05-21 DIAGNOSIS — I10 Essential (primary) hypertension: Secondary | ICD-10-CM | POA: Diagnosis not present

## 2023-05-21 DIAGNOSIS — I442 Atrioventricular block, complete: Secondary | ICD-10-CM | POA: Diagnosis not present

## 2023-05-21 DIAGNOSIS — F0154 Vascular dementia, unspecified severity, with anxiety: Secondary | ICD-10-CM | POA: Diagnosis not present

## 2023-05-21 DIAGNOSIS — E782 Mixed hyperlipidemia: Secondary | ICD-10-CM | POA: Diagnosis not present

## 2023-05-21 DIAGNOSIS — F0283 Dementia in other diseases classified elsewhere, unspecified severity, with mood disturbance: Secondary | ICD-10-CM | POA: Diagnosis not present

## 2023-05-21 DIAGNOSIS — I4821 Permanent atrial fibrillation: Secondary | ICD-10-CM | POA: Diagnosis not present

## 2023-05-21 DIAGNOSIS — I2510 Atherosclerotic heart disease of native coronary artery without angina pectoris: Secondary | ICD-10-CM | POA: Diagnosis not present

## 2023-05-21 DIAGNOSIS — G309 Alzheimer's disease, unspecified: Secondary | ICD-10-CM | POA: Diagnosis not present

## 2023-05-21 DIAGNOSIS — F319 Bipolar disorder, unspecified: Secondary | ICD-10-CM | POA: Diagnosis not present

## 2023-05-21 DIAGNOSIS — F0284 Dementia in other diseases classified elsewhere, unspecified severity, with anxiety: Secondary | ICD-10-CM | POA: Diagnosis not present

## 2023-05-21 DIAGNOSIS — F0153 Vascular dementia, unspecified severity, with mood disturbance: Secondary | ICD-10-CM | POA: Diagnosis not present

## 2023-05-22 DIAGNOSIS — I442 Atrioventricular block, complete: Secondary | ICD-10-CM | POA: Diagnosis not present

## 2023-05-22 DIAGNOSIS — F0283 Dementia in other diseases classified elsewhere, unspecified severity, with mood disturbance: Secondary | ICD-10-CM | POA: Diagnosis not present

## 2023-05-22 DIAGNOSIS — F0153 Vascular dementia, unspecified severity, with mood disturbance: Secondary | ICD-10-CM | POA: Diagnosis not present

## 2023-05-22 DIAGNOSIS — F319 Bipolar disorder, unspecified: Secondary | ICD-10-CM | POA: Diagnosis not present

## 2023-05-22 DIAGNOSIS — I10 Essential (primary) hypertension: Secondary | ICD-10-CM | POA: Diagnosis not present

## 2023-05-22 DIAGNOSIS — F0284 Dementia in other diseases classified elsewhere, unspecified severity, with anxiety: Secondary | ICD-10-CM | POA: Diagnosis not present

## 2023-05-22 DIAGNOSIS — G309 Alzheimer's disease, unspecified: Secondary | ICD-10-CM | POA: Diagnosis not present

## 2023-05-22 DIAGNOSIS — I4821 Permanent atrial fibrillation: Secondary | ICD-10-CM | POA: Diagnosis not present

## 2023-05-22 DIAGNOSIS — F0154 Vascular dementia, unspecified severity, with anxiety: Secondary | ICD-10-CM | POA: Diagnosis not present

## 2023-05-25 ENCOUNTER — Ambulatory Visit: Payer: Medicare PPO

## 2023-05-25 DIAGNOSIS — G309 Alzheimer's disease, unspecified: Secondary | ICD-10-CM | POA: Diagnosis not present

## 2023-05-25 DIAGNOSIS — F0153 Vascular dementia, unspecified severity, with mood disturbance: Secondary | ICD-10-CM | POA: Diagnosis not present

## 2023-05-25 DIAGNOSIS — I4821 Permanent atrial fibrillation: Secondary | ICD-10-CM | POA: Diagnosis not present

## 2023-05-25 DIAGNOSIS — F319 Bipolar disorder, unspecified: Secondary | ICD-10-CM | POA: Diagnosis not present

## 2023-05-25 DIAGNOSIS — F0284 Dementia in other diseases classified elsewhere, unspecified severity, with anxiety: Secondary | ICD-10-CM | POA: Diagnosis not present

## 2023-05-25 DIAGNOSIS — F0283 Dementia in other diseases classified elsewhere, unspecified severity, with mood disturbance: Secondary | ICD-10-CM | POA: Diagnosis not present

## 2023-05-25 DIAGNOSIS — F0154 Vascular dementia, unspecified severity, with anxiety: Secondary | ICD-10-CM | POA: Diagnosis not present

## 2023-05-25 DIAGNOSIS — I10 Essential (primary) hypertension: Secondary | ICD-10-CM | POA: Diagnosis not present

## 2023-05-25 DIAGNOSIS — I442 Atrioventricular block, complete: Secondary | ICD-10-CM | POA: Diagnosis not present

## 2023-05-26 DIAGNOSIS — I442 Atrioventricular block, complete: Secondary | ICD-10-CM | POA: Diagnosis not present

## 2023-05-26 DIAGNOSIS — F0153 Vascular dementia, unspecified severity, with mood disturbance: Secondary | ICD-10-CM | POA: Diagnosis not present

## 2023-05-26 DIAGNOSIS — F0154 Vascular dementia, unspecified severity, with anxiety: Secondary | ICD-10-CM | POA: Diagnosis not present

## 2023-05-26 DIAGNOSIS — I10 Essential (primary) hypertension: Secondary | ICD-10-CM | POA: Diagnosis not present

## 2023-05-26 DIAGNOSIS — F0283 Dementia in other diseases classified elsewhere, unspecified severity, with mood disturbance: Secondary | ICD-10-CM | POA: Diagnosis not present

## 2023-05-26 DIAGNOSIS — F0284 Dementia in other diseases classified elsewhere, unspecified severity, with anxiety: Secondary | ICD-10-CM | POA: Diagnosis not present

## 2023-05-26 DIAGNOSIS — G309 Alzheimer's disease, unspecified: Secondary | ICD-10-CM | POA: Diagnosis not present

## 2023-05-26 DIAGNOSIS — I4821 Permanent atrial fibrillation: Secondary | ICD-10-CM | POA: Diagnosis not present

## 2023-05-26 DIAGNOSIS — F319 Bipolar disorder, unspecified: Secondary | ICD-10-CM | POA: Diagnosis not present

## 2023-05-26 NOTE — Progress Notes (Signed)
 Merlin Loop [employer]

## 2023-05-28 DIAGNOSIS — F319 Bipolar disorder, unspecified: Secondary | ICD-10-CM | POA: Diagnosis not present

## 2023-05-28 DIAGNOSIS — I4821 Permanent atrial fibrillation: Secondary | ICD-10-CM | POA: Diagnosis not present

## 2023-05-28 DIAGNOSIS — G309 Alzheimer's disease, unspecified: Secondary | ICD-10-CM | POA: Diagnosis not present

## 2023-05-28 DIAGNOSIS — F0284 Dementia in other diseases classified elsewhere, unspecified severity, with anxiety: Secondary | ICD-10-CM | POA: Diagnosis not present

## 2023-05-28 DIAGNOSIS — F0154 Vascular dementia, unspecified severity, with anxiety: Secondary | ICD-10-CM | POA: Diagnosis not present

## 2023-05-28 DIAGNOSIS — I442 Atrioventricular block, complete: Secondary | ICD-10-CM | POA: Diagnosis not present

## 2023-05-28 DIAGNOSIS — F0283 Dementia in other diseases classified elsewhere, unspecified severity, with mood disturbance: Secondary | ICD-10-CM | POA: Diagnosis not present

## 2023-05-28 DIAGNOSIS — F0153 Vascular dementia, unspecified severity, with mood disturbance: Secondary | ICD-10-CM | POA: Diagnosis not present

## 2023-05-28 DIAGNOSIS — I10 Essential (primary) hypertension: Secondary | ICD-10-CM | POA: Diagnosis not present

## 2023-06-01 DIAGNOSIS — F0284 Dementia in other diseases classified elsewhere, unspecified severity, with anxiety: Secondary | ICD-10-CM | POA: Diagnosis not present

## 2023-06-01 DIAGNOSIS — G309 Alzheimer's disease, unspecified: Secondary | ICD-10-CM | POA: Diagnosis not present

## 2023-06-01 DIAGNOSIS — F0283 Dementia in other diseases classified elsewhere, unspecified severity, with mood disturbance: Secondary | ICD-10-CM | POA: Diagnosis not present

## 2023-06-01 DIAGNOSIS — I442 Atrioventricular block, complete: Secondary | ICD-10-CM | POA: Diagnosis not present

## 2023-06-01 DIAGNOSIS — F0153 Vascular dementia, unspecified severity, with mood disturbance: Secondary | ICD-10-CM | POA: Diagnosis not present

## 2023-06-01 DIAGNOSIS — F0154 Vascular dementia, unspecified severity, with anxiety: Secondary | ICD-10-CM | POA: Diagnosis not present

## 2023-06-01 DIAGNOSIS — I10 Essential (primary) hypertension: Secondary | ICD-10-CM | POA: Diagnosis not present

## 2023-06-01 DIAGNOSIS — F319 Bipolar disorder, unspecified: Secondary | ICD-10-CM | POA: Diagnosis not present

## 2023-06-01 DIAGNOSIS — I4821 Permanent atrial fibrillation: Secondary | ICD-10-CM | POA: Diagnosis not present

## 2023-06-02 DIAGNOSIS — G309 Alzheimer's disease, unspecified: Secondary | ICD-10-CM | POA: Diagnosis not present

## 2023-06-02 DIAGNOSIS — F0153 Vascular dementia, unspecified severity, with mood disturbance: Secondary | ICD-10-CM | POA: Diagnosis not present

## 2023-06-02 DIAGNOSIS — I442 Atrioventricular block, complete: Secondary | ICD-10-CM | POA: Diagnosis not present

## 2023-06-02 DIAGNOSIS — F0283 Dementia in other diseases classified elsewhere, unspecified severity, with mood disturbance: Secondary | ICD-10-CM | POA: Diagnosis not present

## 2023-06-02 DIAGNOSIS — I10 Essential (primary) hypertension: Secondary | ICD-10-CM | POA: Diagnosis not present

## 2023-06-02 DIAGNOSIS — F319 Bipolar disorder, unspecified: Secondary | ICD-10-CM | POA: Diagnosis not present

## 2023-06-02 DIAGNOSIS — I4821 Permanent atrial fibrillation: Secondary | ICD-10-CM | POA: Diagnosis not present

## 2023-06-02 DIAGNOSIS — F0154 Vascular dementia, unspecified severity, with anxiety: Secondary | ICD-10-CM | POA: Diagnosis not present

## 2023-06-02 DIAGNOSIS — F0284 Dementia in other diseases classified elsewhere, unspecified severity, with anxiety: Secondary | ICD-10-CM | POA: Diagnosis not present

## 2023-06-03 DIAGNOSIS — F319 Bipolar disorder, unspecified: Secondary | ICD-10-CM | POA: Diagnosis not present

## 2023-06-03 DIAGNOSIS — I4821 Permanent atrial fibrillation: Secondary | ICD-10-CM | POA: Diagnosis not present

## 2023-06-03 DIAGNOSIS — F0153 Vascular dementia, unspecified severity, with mood disturbance: Secondary | ICD-10-CM | POA: Diagnosis not present

## 2023-06-03 DIAGNOSIS — F0283 Dementia in other diseases classified elsewhere, unspecified severity, with mood disturbance: Secondary | ICD-10-CM | POA: Diagnosis not present

## 2023-06-03 DIAGNOSIS — I442 Atrioventricular block, complete: Secondary | ICD-10-CM | POA: Diagnosis not present

## 2023-06-03 DIAGNOSIS — F0154 Vascular dementia, unspecified severity, with anxiety: Secondary | ICD-10-CM | POA: Diagnosis not present

## 2023-06-03 DIAGNOSIS — F0284 Dementia in other diseases classified elsewhere, unspecified severity, with anxiety: Secondary | ICD-10-CM | POA: Diagnosis not present

## 2023-06-03 DIAGNOSIS — I10 Essential (primary) hypertension: Secondary | ICD-10-CM | POA: Diagnosis not present

## 2023-06-03 DIAGNOSIS — G309 Alzheimer's disease, unspecified: Secondary | ICD-10-CM | POA: Diagnosis not present

## 2023-06-04 DIAGNOSIS — R296 Repeated falls: Secondary | ICD-10-CM | POA: Diagnosis not present

## 2023-06-04 DIAGNOSIS — J90 Pleural effusion, not elsewhere classified: Secondary | ICD-10-CM | POA: Diagnosis not present

## 2023-06-04 DIAGNOSIS — F03918 Unspecified dementia, unspecified severity, with other behavioral disturbance: Secondary | ICD-10-CM | POA: Diagnosis not present

## 2023-06-04 DIAGNOSIS — R030 Elevated blood-pressure reading, without diagnosis of hypertension: Secondary | ICD-10-CM | POA: Diagnosis not present

## 2023-06-04 DIAGNOSIS — F0390 Unspecified dementia without behavioral disturbance: Secondary | ICD-10-CM | POA: Diagnosis not present

## 2023-06-04 DIAGNOSIS — I10 Essential (primary) hypertension: Secondary | ICD-10-CM | POA: Diagnosis not present

## 2023-06-04 DIAGNOSIS — M6281 Muscle weakness (generalized): Secondary | ICD-10-CM | POA: Diagnosis not present

## 2023-06-04 DIAGNOSIS — I119 Hypertensive heart disease without heart failure: Secondary | ICD-10-CM | POA: Diagnosis not present

## 2023-06-04 DIAGNOSIS — S0990XA Unspecified injury of head, initial encounter: Secondary | ICD-10-CM | POA: Diagnosis not present

## 2023-06-04 DIAGNOSIS — W19XXXA Unspecified fall, initial encounter: Secondary | ICD-10-CM | POA: Diagnosis not present

## 2023-06-09 ENCOUNTER — Ambulatory Visit (INDEPENDENT_AMBULATORY_CARE_PROVIDER_SITE_OTHER): Payer: Medicare PPO

## 2023-06-09 DIAGNOSIS — F319 Bipolar disorder, unspecified: Secondary | ICD-10-CM | POA: Diagnosis not present

## 2023-06-09 DIAGNOSIS — R55 Syncope and collapse: Secondary | ICD-10-CM

## 2023-06-09 DIAGNOSIS — F0154 Vascular dementia, unspecified severity, with anxiety: Secondary | ICD-10-CM | POA: Diagnosis not present

## 2023-06-09 DIAGNOSIS — F0283 Dementia in other diseases classified elsewhere, unspecified severity, with mood disturbance: Secondary | ICD-10-CM | POA: Diagnosis not present

## 2023-06-09 DIAGNOSIS — F0153 Vascular dementia, unspecified severity, with mood disturbance: Secondary | ICD-10-CM | POA: Diagnosis not present

## 2023-06-09 DIAGNOSIS — I4821 Permanent atrial fibrillation: Secondary | ICD-10-CM | POA: Diagnosis not present

## 2023-06-09 DIAGNOSIS — I442 Atrioventricular block, complete: Secondary | ICD-10-CM | POA: Diagnosis not present

## 2023-06-09 DIAGNOSIS — F0284 Dementia in other diseases classified elsewhere, unspecified severity, with anxiety: Secondary | ICD-10-CM | POA: Diagnosis not present

## 2023-06-09 DIAGNOSIS — G309 Alzheimer's disease, unspecified: Secondary | ICD-10-CM | POA: Diagnosis not present

## 2023-06-09 DIAGNOSIS — I10 Essential (primary) hypertension: Secondary | ICD-10-CM | POA: Diagnosis not present

## 2023-06-10 DIAGNOSIS — G309 Alzheimer's disease, unspecified: Secondary | ICD-10-CM | POA: Diagnosis not present

## 2023-06-10 DIAGNOSIS — F0153 Vascular dementia, unspecified severity, with mood disturbance: Secondary | ICD-10-CM | POA: Diagnosis not present

## 2023-06-10 DIAGNOSIS — F0283 Dementia in other diseases classified elsewhere, unspecified severity, with mood disturbance: Secondary | ICD-10-CM | POA: Diagnosis not present

## 2023-06-10 DIAGNOSIS — I10 Essential (primary) hypertension: Secondary | ICD-10-CM | POA: Diagnosis not present

## 2023-06-10 DIAGNOSIS — I4821 Permanent atrial fibrillation: Secondary | ICD-10-CM | POA: Diagnosis not present

## 2023-06-10 DIAGNOSIS — I442 Atrioventricular block, complete: Secondary | ICD-10-CM | POA: Diagnosis not present

## 2023-06-10 DIAGNOSIS — F0284 Dementia in other diseases classified elsewhere, unspecified severity, with anxiety: Secondary | ICD-10-CM | POA: Diagnosis not present

## 2023-06-10 DIAGNOSIS — F319 Bipolar disorder, unspecified: Secondary | ICD-10-CM | POA: Diagnosis not present

## 2023-06-10 DIAGNOSIS — F0154 Vascular dementia, unspecified severity, with anxiety: Secondary | ICD-10-CM | POA: Diagnosis not present

## 2023-06-10 LAB — CUP PACEART REMOTE DEVICE CHECK
Battery Remaining Longevity: 183 mo
Battery Voltage: 3.22 V
Brady Statistic RV Percent Paced: 20.97 %
Date Time Interrogation Session: 20250408041918
Implantable Lead Connection Status: 753985
Implantable Lead Implant Date: 20250221
Implantable Lead Location: 753860
Implantable Lead Model: 5076
Implantable Pulse Generator Implant Date: 20250221
Lead Channel Impedance Value: 361 Ohm
Lead Channel Impedance Value: 551 Ohm
Lead Channel Pacing Threshold Amplitude: 0.75 V
Lead Channel Pacing Threshold Pulse Width: 0.4 ms
Lead Channel Sensing Intrinsic Amplitude: 11.75 mV
Lead Channel Sensing Intrinsic Amplitude: 11.75 mV
Lead Channel Setting Pacing Amplitude: 3.5 V
Lead Channel Setting Pacing Pulse Width: 0.4 ms
Lead Channel Setting Sensing Sensitivity: 1.2 mV
Zone Setting Status: 755011

## 2023-06-11 DIAGNOSIS — F0154 Vascular dementia, unspecified severity, with anxiety: Secondary | ICD-10-CM | POA: Diagnosis not present

## 2023-06-11 DIAGNOSIS — G309 Alzheimer's disease, unspecified: Secondary | ICD-10-CM | POA: Diagnosis not present

## 2023-06-11 DIAGNOSIS — I442 Atrioventricular block, complete: Secondary | ICD-10-CM | POA: Diagnosis not present

## 2023-06-11 DIAGNOSIS — F0153 Vascular dementia, unspecified severity, with mood disturbance: Secondary | ICD-10-CM | POA: Diagnosis not present

## 2023-06-11 DIAGNOSIS — F0283 Dementia in other diseases classified elsewhere, unspecified severity, with mood disturbance: Secondary | ICD-10-CM | POA: Diagnosis not present

## 2023-06-11 DIAGNOSIS — F319 Bipolar disorder, unspecified: Secondary | ICD-10-CM | POA: Diagnosis not present

## 2023-06-11 DIAGNOSIS — I10 Essential (primary) hypertension: Secondary | ICD-10-CM | POA: Diagnosis not present

## 2023-06-11 DIAGNOSIS — F0284 Dementia in other diseases classified elsewhere, unspecified severity, with anxiety: Secondary | ICD-10-CM | POA: Diagnosis not present

## 2023-06-11 DIAGNOSIS — I4821 Permanent atrial fibrillation: Secondary | ICD-10-CM | POA: Diagnosis not present

## 2023-06-16 DIAGNOSIS — F0284 Dementia in other diseases classified elsewhere, unspecified severity, with anxiety: Secondary | ICD-10-CM | POA: Diagnosis not present

## 2023-06-16 DIAGNOSIS — F5105 Insomnia due to other mental disorder: Secondary | ICD-10-CM | POA: Diagnosis not present

## 2023-06-16 DIAGNOSIS — F411 Generalized anxiety disorder: Secondary | ICD-10-CM | POA: Diagnosis not present

## 2023-06-16 DIAGNOSIS — F01518 Vascular dementia, unspecified severity, with other behavioral disturbance: Secondary | ICD-10-CM | POA: Diagnosis not present

## 2023-06-16 DIAGNOSIS — F0154 Vascular dementia, unspecified severity, with anxiety: Secondary | ICD-10-CM | POA: Diagnosis not present

## 2023-06-16 DIAGNOSIS — G26 Extrapyramidal and movement disorders in diseases classified elsewhere: Secondary | ICD-10-CM | POA: Diagnosis not present

## 2023-06-16 DIAGNOSIS — F319 Bipolar disorder, unspecified: Secondary | ICD-10-CM | POA: Diagnosis not present

## 2023-06-16 DIAGNOSIS — I10 Essential (primary) hypertension: Secondary | ICD-10-CM | POA: Diagnosis not present

## 2023-06-16 DIAGNOSIS — I4821 Permanent atrial fibrillation: Secondary | ICD-10-CM | POA: Diagnosis not present

## 2023-06-16 DIAGNOSIS — F0283 Dementia in other diseases classified elsewhere, unspecified severity, with mood disturbance: Secondary | ICD-10-CM | POA: Diagnosis not present

## 2023-06-16 DIAGNOSIS — F0153 Vascular dementia, unspecified severity, with mood disturbance: Secondary | ICD-10-CM | POA: Diagnosis not present

## 2023-06-16 DIAGNOSIS — G309 Alzheimer's disease, unspecified: Secondary | ICD-10-CM | POA: Diagnosis not present

## 2023-06-16 DIAGNOSIS — G301 Alzheimer's disease with late onset: Secondary | ICD-10-CM | POA: Diagnosis not present

## 2023-06-16 DIAGNOSIS — I442 Atrioventricular block, complete: Secondary | ICD-10-CM | POA: Diagnosis not present

## 2023-06-17 DIAGNOSIS — I10 Essential (primary) hypertension: Secondary | ICD-10-CM | POA: Diagnosis not present

## 2023-06-17 DIAGNOSIS — F310 Bipolar disorder, current episode hypomanic: Secondary | ICD-10-CM | POA: Diagnosis not present

## 2023-06-17 DIAGNOSIS — F0284 Dementia in other diseases classified elsewhere, unspecified severity, with anxiety: Secondary | ICD-10-CM | POA: Diagnosis not present

## 2023-06-17 DIAGNOSIS — F0283 Dementia in other diseases classified elsewhere, unspecified severity, with mood disturbance: Secondary | ICD-10-CM | POA: Diagnosis not present

## 2023-06-17 DIAGNOSIS — F319 Bipolar disorder, unspecified: Secondary | ICD-10-CM | POA: Diagnosis not present

## 2023-06-17 DIAGNOSIS — F0154 Vascular dementia, unspecified severity, with anxiety: Secondary | ICD-10-CM | POA: Diagnosis not present

## 2023-06-17 DIAGNOSIS — I442 Atrioventricular block, complete: Secondary | ICD-10-CM | POA: Diagnosis not present

## 2023-06-17 DIAGNOSIS — I4821 Permanent atrial fibrillation: Secondary | ICD-10-CM | POA: Diagnosis not present

## 2023-06-17 DIAGNOSIS — G309 Alzheimer's disease, unspecified: Secondary | ICD-10-CM | POA: Diagnosis not present

## 2023-06-17 DIAGNOSIS — F0153 Vascular dementia, unspecified severity, with mood disturbance: Secondary | ICD-10-CM | POA: Diagnosis not present

## 2023-06-18 DIAGNOSIS — I442 Atrioventricular block, complete: Secondary | ICD-10-CM | POA: Diagnosis not present

## 2023-06-18 DIAGNOSIS — I10 Essential (primary) hypertension: Secondary | ICD-10-CM | POA: Diagnosis not present

## 2023-06-18 DIAGNOSIS — F0153 Vascular dementia, unspecified severity, with mood disturbance: Secondary | ICD-10-CM | POA: Diagnosis not present

## 2023-06-18 DIAGNOSIS — G309 Alzheimer's disease, unspecified: Secondary | ICD-10-CM | POA: Diagnosis not present

## 2023-06-18 DIAGNOSIS — F0283 Dementia in other diseases classified elsewhere, unspecified severity, with mood disturbance: Secondary | ICD-10-CM | POA: Diagnosis not present

## 2023-06-18 DIAGNOSIS — F319 Bipolar disorder, unspecified: Secondary | ICD-10-CM | POA: Diagnosis not present

## 2023-06-18 DIAGNOSIS — I2510 Atherosclerotic heart disease of native coronary artery without angina pectoris: Secondary | ICD-10-CM | POA: Diagnosis not present

## 2023-06-18 DIAGNOSIS — F0154 Vascular dementia, unspecified severity, with anxiety: Secondary | ICD-10-CM | POA: Diagnosis not present

## 2023-06-18 DIAGNOSIS — I4821 Permanent atrial fibrillation: Secondary | ICD-10-CM | POA: Diagnosis not present

## 2023-06-18 DIAGNOSIS — G4700 Insomnia, unspecified: Secondary | ICD-10-CM | POA: Diagnosis not present

## 2023-06-18 DIAGNOSIS — F0284 Dementia in other diseases classified elsewhere, unspecified severity, with anxiety: Secondary | ICD-10-CM | POA: Diagnosis not present

## 2023-06-20 DIAGNOSIS — I4821 Permanent atrial fibrillation: Secondary | ICD-10-CM | POA: Diagnosis not present

## 2023-06-20 DIAGNOSIS — G309 Alzheimer's disease, unspecified: Secondary | ICD-10-CM | POA: Diagnosis not present

## 2023-06-20 DIAGNOSIS — F0283 Dementia in other diseases classified elsewhere, unspecified severity, with mood disturbance: Secondary | ICD-10-CM | POA: Diagnosis not present

## 2023-06-20 DIAGNOSIS — F319 Bipolar disorder, unspecified: Secondary | ICD-10-CM | POA: Diagnosis not present

## 2023-06-20 DIAGNOSIS — F0153 Vascular dementia, unspecified severity, with mood disturbance: Secondary | ICD-10-CM | POA: Diagnosis not present

## 2023-06-20 DIAGNOSIS — I10 Essential (primary) hypertension: Secondary | ICD-10-CM | POA: Diagnosis not present

## 2023-06-20 DIAGNOSIS — I442 Atrioventricular block, complete: Secondary | ICD-10-CM | POA: Diagnosis not present

## 2023-06-20 DIAGNOSIS — F0284 Dementia in other diseases classified elsewhere, unspecified severity, with anxiety: Secondary | ICD-10-CM | POA: Diagnosis not present

## 2023-06-20 DIAGNOSIS — F0154 Vascular dementia, unspecified severity, with anxiety: Secondary | ICD-10-CM | POA: Diagnosis not present

## 2023-06-25 DIAGNOSIS — F319 Bipolar disorder, unspecified: Secondary | ICD-10-CM | POA: Diagnosis not present

## 2023-06-25 DIAGNOSIS — I4821 Permanent atrial fibrillation: Secondary | ICD-10-CM | POA: Diagnosis not present

## 2023-06-25 DIAGNOSIS — I10 Essential (primary) hypertension: Secondary | ICD-10-CM | POA: Diagnosis not present

## 2023-06-25 DIAGNOSIS — F0283 Dementia in other diseases classified elsewhere, unspecified severity, with mood disturbance: Secondary | ICD-10-CM | POA: Diagnosis not present

## 2023-06-25 DIAGNOSIS — F0153 Vascular dementia, unspecified severity, with mood disturbance: Secondary | ICD-10-CM | POA: Diagnosis not present

## 2023-06-25 DIAGNOSIS — I442 Atrioventricular block, complete: Secondary | ICD-10-CM | POA: Diagnosis not present

## 2023-06-25 DIAGNOSIS — G309 Alzheimer's disease, unspecified: Secondary | ICD-10-CM | POA: Diagnosis not present

## 2023-06-25 DIAGNOSIS — F0284 Dementia in other diseases classified elsewhere, unspecified severity, with anxiety: Secondary | ICD-10-CM | POA: Diagnosis not present

## 2023-06-25 DIAGNOSIS — F0154 Vascular dementia, unspecified severity, with anxiety: Secondary | ICD-10-CM | POA: Diagnosis not present

## 2023-06-29 ENCOUNTER — Ambulatory Visit: Payer: Medicare PPO

## 2023-06-30 DIAGNOSIS — F0153 Vascular dementia, unspecified severity, with mood disturbance: Secondary | ICD-10-CM | POA: Diagnosis not present

## 2023-06-30 DIAGNOSIS — F0283 Dementia in other diseases classified elsewhere, unspecified severity, with mood disturbance: Secondary | ICD-10-CM | POA: Diagnosis not present

## 2023-06-30 DIAGNOSIS — F0154 Vascular dementia, unspecified severity, with anxiety: Secondary | ICD-10-CM | POA: Diagnosis not present

## 2023-06-30 DIAGNOSIS — F319 Bipolar disorder, unspecified: Secondary | ICD-10-CM | POA: Diagnosis not present

## 2023-06-30 DIAGNOSIS — G309 Alzheimer's disease, unspecified: Secondary | ICD-10-CM | POA: Diagnosis not present

## 2023-06-30 DIAGNOSIS — I10 Essential (primary) hypertension: Secondary | ICD-10-CM | POA: Diagnosis not present

## 2023-06-30 DIAGNOSIS — I442 Atrioventricular block, complete: Secondary | ICD-10-CM | POA: Diagnosis not present

## 2023-06-30 DIAGNOSIS — F0284 Dementia in other diseases classified elsewhere, unspecified severity, with anxiety: Secondary | ICD-10-CM | POA: Diagnosis not present

## 2023-06-30 DIAGNOSIS — I4821 Permanent atrial fibrillation: Secondary | ICD-10-CM | POA: Diagnosis not present

## 2023-07-07 DIAGNOSIS — G4700 Insomnia, unspecified: Secondary | ICD-10-CM | POA: Diagnosis not present

## 2023-07-07 DIAGNOSIS — I2510 Atherosclerotic heart disease of native coronary artery without angina pectoris: Secondary | ICD-10-CM | POA: Diagnosis not present

## 2023-07-07 DIAGNOSIS — F319 Bipolar disorder, unspecified: Secondary | ICD-10-CM | POA: Diagnosis not present

## 2023-07-07 DIAGNOSIS — G309 Alzheimer's disease, unspecified: Secondary | ICD-10-CM | POA: Diagnosis not present

## 2023-07-07 DIAGNOSIS — F0283 Dementia in other diseases classified elsewhere, unspecified severity, with mood disturbance: Secondary | ICD-10-CM | POA: Diagnosis not present

## 2023-07-07 DIAGNOSIS — F0284 Dementia in other diseases classified elsewhere, unspecified severity, with anxiety: Secondary | ICD-10-CM | POA: Diagnosis not present

## 2023-07-07 DIAGNOSIS — I10 Essential (primary) hypertension: Secondary | ICD-10-CM | POA: Diagnosis not present

## 2023-07-07 DIAGNOSIS — I442 Atrioventricular block, complete: Secondary | ICD-10-CM | POA: Diagnosis not present

## 2023-07-07 DIAGNOSIS — I4821 Permanent atrial fibrillation: Secondary | ICD-10-CM | POA: Diagnosis not present

## 2023-07-07 DIAGNOSIS — F0154 Vascular dementia, unspecified severity, with anxiety: Secondary | ICD-10-CM | POA: Diagnosis not present

## 2023-07-07 DIAGNOSIS — F0153 Vascular dementia, unspecified severity, with mood disturbance: Secondary | ICD-10-CM | POA: Diagnosis not present

## 2023-07-09 DIAGNOSIS — I442 Atrioventricular block, complete: Secondary | ICD-10-CM | POA: Diagnosis not present

## 2023-07-09 DIAGNOSIS — R296 Repeated falls: Secondary | ICD-10-CM | POA: Diagnosis not present

## 2023-07-09 DIAGNOSIS — I10 Essential (primary) hypertension: Secondary | ICD-10-CM | POA: Diagnosis not present

## 2023-07-09 DIAGNOSIS — G309 Alzheimer's disease, unspecified: Secondary | ICD-10-CM | POA: Diagnosis not present

## 2023-07-09 DIAGNOSIS — I4821 Permanent atrial fibrillation: Secondary | ICD-10-CM | POA: Diagnosis not present

## 2023-07-09 DIAGNOSIS — M6281 Muscle weakness (generalized): Secondary | ICD-10-CM | POA: Diagnosis not present

## 2023-07-09 DIAGNOSIS — F0284 Dementia in other diseases classified elsewhere, unspecified severity, with anxiety: Secondary | ICD-10-CM | POA: Diagnosis not present

## 2023-07-09 DIAGNOSIS — F319 Bipolar disorder, unspecified: Secondary | ICD-10-CM | POA: Diagnosis not present

## 2023-07-09 DIAGNOSIS — F0153 Vascular dementia, unspecified severity, with mood disturbance: Secondary | ICD-10-CM | POA: Diagnosis not present

## 2023-07-09 DIAGNOSIS — F0283 Dementia in other diseases classified elsewhere, unspecified severity, with mood disturbance: Secondary | ICD-10-CM | POA: Diagnosis not present

## 2023-07-09 DIAGNOSIS — F0154 Vascular dementia, unspecified severity, with anxiety: Secondary | ICD-10-CM | POA: Diagnosis not present

## 2023-07-13 DIAGNOSIS — F0153 Vascular dementia, unspecified severity, with mood disturbance: Secondary | ICD-10-CM | POA: Diagnosis not present

## 2023-07-13 DIAGNOSIS — I4821 Permanent atrial fibrillation: Secondary | ICD-10-CM | POA: Diagnosis not present

## 2023-07-13 DIAGNOSIS — F319 Bipolar disorder, unspecified: Secondary | ICD-10-CM | POA: Diagnosis not present

## 2023-07-13 DIAGNOSIS — I442 Atrioventricular block, complete: Secondary | ICD-10-CM | POA: Diagnosis not present

## 2023-07-13 DIAGNOSIS — I10 Essential (primary) hypertension: Secondary | ICD-10-CM | POA: Diagnosis not present

## 2023-07-13 DIAGNOSIS — F0154 Vascular dementia, unspecified severity, with anxiety: Secondary | ICD-10-CM | POA: Diagnosis not present

## 2023-07-13 DIAGNOSIS — G309 Alzheimer's disease, unspecified: Secondary | ICD-10-CM | POA: Diagnosis not present

## 2023-07-13 DIAGNOSIS — F0284 Dementia in other diseases classified elsewhere, unspecified severity, with anxiety: Secondary | ICD-10-CM | POA: Diagnosis not present

## 2023-07-13 DIAGNOSIS — F0283 Dementia in other diseases classified elsewhere, unspecified severity, with mood disturbance: Secondary | ICD-10-CM | POA: Diagnosis not present

## 2023-07-14 DIAGNOSIS — F5105 Insomnia due to other mental disorder: Secondary | ICD-10-CM | POA: Diagnosis not present

## 2023-07-14 DIAGNOSIS — F319 Bipolar disorder, unspecified: Secondary | ICD-10-CM | POA: Diagnosis not present

## 2023-07-14 DIAGNOSIS — G26 Extrapyramidal and movement disorders in diseases classified elsewhere: Secondary | ICD-10-CM | POA: Diagnosis not present

## 2023-07-14 DIAGNOSIS — F411 Generalized anxiety disorder: Secondary | ICD-10-CM | POA: Diagnosis not present

## 2023-07-14 DIAGNOSIS — G301 Alzheimer's disease with late onset: Secondary | ICD-10-CM | POA: Diagnosis not present

## 2023-07-14 DIAGNOSIS — F01518 Vascular dementia, unspecified severity, with other behavioral disturbance: Secondary | ICD-10-CM | POA: Diagnosis not present

## 2023-07-16 DIAGNOSIS — E782 Mixed hyperlipidemia: Secondary | ICD-10-CM | POA: Diagnosis not present

## 2023-07-16 DIAGNOSIS — F0390 Unspecified dementia without behavioral disturbance: Secondary | ICD-10-CM | POA: Diagnosis not present

## 2023-07-16 DIAGNOSIS — S6992XA Unspecified injury of left wrist, hand and finger(s), initial encounter: Secondary | ICD-10-CM | POA: Diagnosis not present

## 2023-07-16 DIAGNOSIS — F310 Bipolar disorder, current episode hypomanic: Secondary | ICD-10-CM | POA: Diagnosis not present

## 2023-07-20 DIAGNOSIS — F319 Bipolar disorder, unspecified: Secondary | ICD-10-CM | POA: Diagnosis not present

## 2023-07-20 DIAGNOSIS — G309 Alzheimer's disease, unspecified: Secondary | ICD-10-CM | POA: Diagnosis not present

## 2023-07-20 DIAGNOSIS — I4821 Permanent atrial fibrillation: Secondary | ICD-10-CM | POA: Diagnosis not present

## 2023-07-20 DIAGNOSIS — I442 Atrioventricular block, complete: Secondary | ICD-10-CM | POA: Diagnosis not present

## 2023-07-20 DIAGNOSIS — I10 Essential (primary) hypertension: Secondary | ICD-10-CM | POA: Diagnosis not present

## 2023-07-20 DIAGNOSIS — F0284 Dementia in other diseases classified elsewhere, unspecified severity, with anxiety: Secondary | ICD-10-CM | POA: Diagnosis not present

## 2023-07-20 DIAGNOSIS — F0283 Dementia in other diseases classified elsewhere, unspecified severity, with mood disturbance: Secondary | ICD-10-CM | POA: Diagnosis not present

## 2023-07-20 DIAGNOSIS — F0154 Vascular dementia, unspecified severity, with anxiety: Secondary | ICD-10-CM | POA: Diagnosis not present

## 2023-07-20 DIAGNOSIS — F0153 Vascular dementia, unspecified severity, with mood disturbance: Secondary | ICD-10-CM | POA: Diagnosis not present

## 2023-07-22 DIAGNOSIS — F0284 Dementia in other diseases classified elsewhere, unspecified severity, with anxiety: Secondary | ICD-10-CM | POA: Diagnosis not present

## 2023-07-22 DIAGNOSIS — Z79899 Other long term (current) drug therapy: Secondary | ICD-10-CM | POA: Diagnosis not present

## 2023-07-22 DIAGNOSIS — I10 Essential (primary) hypertension: Secondary | ICD-10-CM | POA: Diagnosis not present

## 2023-07-22 DIAGNOSIS — G309 Alzheimer's disease, unspecified: Secondary | ICD-10-CM | POA: Diagnosis not present

## 2023-07-22 DIAGNOSIS — F0283 Dementia in other diseases classified elsewhere, unspecified severity, with mood disturbance: Secondary | ICD-10-CM | POA: Diagnosis not present

## 2023-07-23 DIAGNOSIS — F0154 Vascular dementia, unspecified severity, with anxiety: Secondary | ICD-10-CM | POA: Diagnosis not present

## 2023-07-23 DIAGNOSIS — F0283 Dementia in other diseases classified elsewhere, unspecified severity, with mood disturbance: Secondary | ICD-10-CM | POA: Diagnosis not present

## 2023-07-23 DIAGNOSIS — F0284 Dementia in other diseases classified elsewhere, unspecified severity, with anxiety: Secondary | ICD-10-CM | POA: Diagnosis not present

## 2023-07-23 DIAGNOSIS — I442 Atrioventricular block, complete: Secondary | ICD-10-CM | POA: Diagnosis not present

## 2023-07-23 DIAGNOSIS — F319 Bipolar disorder, unspecified: Secondary | ICD-10-CM | POA: Diagnosis not present

## 2023-07-23 DIAGNOSIS — G309 Alzheimer's disease, unspecified: Secondary | ICD-10-CM | POA: Diagnosis not present

## 2023-07-23 DIAGNOSIS — I4821 Permanent atrial fibrillation: Secondary | ICD-10-CM | POA: Diagnosis not present

## 2023-07-23 DIAGNOSIS — I10 Essential (primary) hypertension: Secondary | ICD-10-CM | POA: Diagnosis not present

## 2023-07-23 DIAGNOSIS — F0153 Vascular dementia, unspecified severity, with mood disturbance: Secondary | ICD-10-CM | POA: Diagnosis not present

## 2023-07-28 DIAGNOSIS — I10 Essential (primary) hypertension: Secondary | ICD-10-CM | POA: Diagnosis not present

## 2023-07-28 DIAGNOSIS — F411 Generalized anxiety disorder: Secondary | ICD-10-CM | POA: Diagnosis not present

## 2023-07-28 DIAGNOSIS — F3113 Bipolar disorder, current episode manic without psychotic features, severe: Secondary | ICD-10-CM | POA: Diagnosis not present

## 2023-07-28 NOTE — Progress Notes (Signed)
 Remote pacemaker transmission.

## 2023-07-28 NOTE — Addendum Note (Signed)
 Addended by: Lott Rouleau A on: 07/28/2023 09:56 AM   Modules accepted: Orders

## 2023-07-31 DIAGNOSIS — I10 Essential (primary) hypertension: Secondary | ICD-10-CM | POA: Diagnosis not present

## 2023-07-31 DIAGNOSIS — F0284 Dementia in other diseases classified elsewhere, unspecified severity, with anxiety: Secondary | ICD-10-CM | POA: Diagnosis not present

## 2023-07-31 DIAGNOSIS — G309 Alzheimer's disease, unspecified: Secondary | ICD-10-CM | POA: Diagnosis not present

## 2023-07-31 DIAGNOSIS — F0283 Dementia in other diseases classified elsewhere, unspecified severity, with mood disturbance: Secondary | ICD-10-CM | POA: Diagnosis not present

## 2023-07-31 DIAGNOSIS — I4821 Permanent atrial fibrillation: Secondary | ICD-10-CM | POA: Diagnosis not present

## 2023-07-31 DIAGNOSIS — F319 Bipolar disorder, unspecified: Secondary | ICD-10-CM | POA: Diagnosis not present

## 2023-07-31 DIAGNOSIS — F0153 Vascular dementia, unspecified severity, with mood disturbance: Secondary | ICD-10-CM | POA: Diagnosis not present

## 2023-07-31 DIAGNOSIS — F0154 Vascular dementia, unspecified severity, with anxiety: Secondary | ICD-10-CM | POA: Diagnosis not present

## 2023-07-31 DIAGNOSIS — I442 Atrioventricular block, complete: Secondary | ICD-10-CM | POA: Diagnosis not present

## 2023-08-03 ENCOUNTER — Ambulatory Visit: Payer: Medicare PPO

## 2023-08-03 ENCOUNTER — Encounter: Payer: Medicare PPO | Admitting: Internal Medicine

## 2023-08-03 DIAGNOSIS — F319 Bipolar disorder, unspecified: Secondary | ICD-10-CM | POA: Diagnosis not present

## 2023-08-03 DIAGNOSIS — G309 Alzheimer's disease, unspecified: Secondary | ICD-10-CM | POA: Diagnosis not present

## 2023-08-03 DIAGNOSIS — F0283 Dementia in other diseases classified elsewhere, unspecified severity, with mood disturbance: Secondary | ICD-10-CM | POA: Diagnosis not present

## 2023-08-03 DIAGNOSIS — I10 Essential (primary) hypertension: Secondary | ICD-10-CM | POA: Diagnosis not present

## 2023-08-03 DIAGNOSIS — F0284 Dementia in other diseases classified elsewhere, unspecified severity, with anxiety: Secondary | ICD-10-CM | POA: Diagnosis not present

## 2023-08-03 DIAGNOSIS — F0153 Vascular dementia, unspecified severity, with mood disturbance: Secondary | ICD-10-CM | POA: Diagnosis not present

## 2023-08-03 DIAGNOSIS — I442 Atrioventricular block, complete: Secondary | ICD-10-CM | POA: Diagnosis not present

## 2023-08-03 DIAGNOSIS — F0154 Vascular dementia, unspecified severity, with anxiety: Secondary | ICD-10-CM | POA: Diagnosis not present

## 2023-08-03 DIAGNOSIS — I4821 Permanent atrial fibrillation: Secondary | ICD-10-CM | POA: Diagnosis not present

## 2023-08-05 ENCOUNTER — Encounter: Payer: Self-pay | Admitting: Pulmonary Disease

## 2023-08-05 ENCOUNTER — Ambulatory Visit: Attending: Pulmonary Disease | Admitting: Pulmonary Disease

## 2023-08-05 VITALS — BP 124/78 | HR 51 | Ht 71.0 in | Wt 173.0 lb

## 2023-08-05 DIAGNOSIS — R001 Bradycardia, unspecified: Secondary | ICD-10-CM

## 2023-08-05 DIAGNOSIS — I482 Chronic atrial fibrillation, unspecified: Secondary | ICD-10-CM

## 2023-08-05 DIAGNOSIS — R55 Syncope and collapse: Secondary | ICD-10-CM

## 2023-08-05 DIAGNOSIS — I10 Essential (primary) hypertension: Secondary | ICD-10-CM | POA: Diagnosis not present

## 2023-08-05 LAB — CUP PACEART INCLINIC DEVICE CHECK
Date Time Interrogation Session: 20250604174146
Implantable Lead Connection Status: 753985
Implantable Lead Implant Date: 20250221
Implantable Lead Location: 753860
Implantable Lead Model: 5076
Implantable Pulse Generator Implant Date: 20250221

## 2023-08-05 NOTE — Progress Notes (Signed)
  Electrophysiology Office Note:   Date:  08/05/2023  ID:  [name] [name], DOB 1945/04/19, MRN [id number]  Primary Cardiologist: Lauro Portal, MD Primary Heart Failure: None Electrophysiologist: Richardo Chandler, MD       History of Present Illness:   [name] [name] is a 78 y.o. male with h/o AF, intermittent CHB s/p PPM, CAD, TIA, HTN, HLD, mixed Alzheimer's / Vascular dementia, Bipolar disorder, anemia seen today for routine electrophysiology follow-up s/p Pacemaker implant.  The patients wife reports he is living in a memory care unit. She is not aware of any device related concerns.      He denies chest pain, palpitations, dyspnea, PND, orthopnea, nausea, vomiting, dizziness, syncope, edema, weight gain, or early satiety.    Review of systems complete and found to be negative unless listed in HPI.   EP Information / Studies Reviewed:    EKG is ordered today. Personal review as below.  EKG Interpretation Date/Time:  Wednesday August 05 2023 14:05:32 EDT Ventricular Rate:  51 PR Interval:    QRS Duration:  110 QT Interval:  460 QTC Calculation: 423 R Axis:   68  Text Interpretation: Atrial fibrillation with slow ventricular response with frequent ventricular-paced complexes Confirmed by Creighton Doffing ([id number]) on 08/05/2023 2:11:35 PM   PPM Interrogation-  reviewed in detail today,  See PACEART report.  Device History: Medtronic Single Chamber PPM implanted 04/24/23 for Symptomatic bradycardia  Risk Assessment/Calculations:    CHA2DS2-VASc Score = 6   This indicates a 9.7% annual risk of stroke. The patient's score is based upon: CHF History: 0 HTN History: 1 Diabetes History: 0 Stroke History: 2 Vascular Disease History: 1 Age Score: 2 Gender Score: 0             Physical Exam:   VS:  BP 124/78 (BP Location: Left Arm, Patient Position: Sitting, Cuff Size: Normal)   Pulse (!) 51   Ht 5\' 11"  (1.803 m)   Wt 173 lb (78.5 kg)   SpO2 99%   BMI 24.13 kg/m    Wt  Readings from Last 3 Encounters:  08/05/23 173 lb (78.5 kg)  04/24/23 173 lb (78.5 kg)  01/13/23 154 lb 15.7 oz (70.3 kg)     GEN: Well nourished, well developed in no acute distress NECK: No JVD; No carotid bruits CARDIAC: Regular rate and rhythm (VP), no murmurs, rubs, gallops RESPIRATORY:  Clear to auscultation without rales, wheezing or rhonchi  ABDOMEN: Soft, non-tender, non-distended EXTREMITIES:  No edema; No deformity   ASSESSMENT AND PLAN:    Symptomatic bradycardia / Intermittent CHB s/p Medtronic PPM  Cardiac monitor showed 36 sec pauses  -Normal PPM function -See Pace Art report -No changes today  Persistent Atrial Fibrillation -not on OAC  -single chamber PPM / VVI   Dementia  -lives at memory care unit   Disposition:   Follow up with EP APP in 12 months  Signed, Creighton Doffing, NP-C, AGACNP-BC [facility] - Electrophysiology  08/05/2023, 5:41 PM

## 2023-08-05 NOTE — Patient Instructions (Signed)
 Medication Instructions:  No medication changes were made during today's visit.  *If you need a refill on your cardiac medications before your next appointment, please call your pharmacy*   Lab Work: No labs were ordered during today's visit.  If you have labs (blood work) drawn today and your tests are completely normal, you will receive your results only by: MyChart Message (if you have MyChart) OR A paper copy in the mail If you have any lab test that is abnormal or we need to change your treatment, we will call you to review the results.   Testing/Procedures: No procedures were ordered during today's visit.    Follow-Up: At [facility], you and your health needs are our priority.  As part of our continuing mission to provide you with exceptional heart care, we have created designated Provider Care Teams.  These Care Teams include your primary Cardiologist (physician) and Advanced Practice Providers (APPs -  Physician Assistants and Nurse Practitioners) who all work together to provide you with the care you need, when you need it.  We recommend signing up for the patient portal called "MyChart".  Sign up information is provided on this After Visit Summary.  MyChart is used to connect with patients for Virtual Visits (Telemedicine).  Patients are able to view lab/test results, encounter notes, upcoming appointments, etc.  Non-urgent messages can be sent to your provider as well.   To learn more about what you can do with MyChart, go to [email or web address].    Your next appointment:   1 year(s)  Provider:   Creighton Doffing NP   Other Instructions Thank you for choosing [facility]!

## 2023-08-06 DIAGNOSIS — F0283 Dementia in other diseases classified elsewhere, unspecified severity, with mood disturbance: Secondary | ICD-10-CM | POA: Diagnosis not present

## 2023-08-06 DIAGNOSIS — F319 Bipolar disorder, unspecified: Secondary | ICD-10-CM | POA: Diagnosis not present

## 2023-08-06 DIAGNOSIS — F0154 Vascular dementia, unspecified severity, with anxiety: Secondary | ICD-10-CM | POA: Diagnosis not present

## 2023-08-06 DIAGNOSIS — F0153 Vascular dementia, unspecified severity, with mood disturbance: Secondary | ICD-10-CM | POA: Diagnosis not present

## 2023-08-06 DIAGNOSIS — F0284 Dementia in other diseases classified elsewhere, unspecified severity, with anxiety: Secondary | ICD-10-CM | POA: Diagnosis not present

## 2023-08-06 DIAGNOSIS — I4821 Permanent atrial fibrillation: Secondary | ICD-10-CM | POA: Diagnosis not present

## 2023-08-06 DIAGNOSIS — I442 Atrioventricular block, complete: Secondary | ICD-10-CM | POA: Diagnosis not present

## 2023-08-06 DIAGNOSIS — I10 Essential (primary) hypertension: Secondary | ICD-10-CM | POA: Diagnosis not present

## 2023-08-06 DIAGNOSIS — G309 Alzheimer's disease, unspecified: Secondary | ICD-10-CM | POA: Diagnosis not present

## 2023-08-08 DIAGNOSIS — S301XXA Contusion of abdominal wall, initial encounter: Secondary | ICD-10-CM | POA: Diagnosis not present

## 2023-08-08 DIAGNOSIS — Z743 Need for continuous supervision: Secondary | ICD-10-CM | POA: Diagnosis not present

## 2023-08-08 DIAGNOSIS — I10 Essential (primary) hypertension: Secondary | ICD-10-CM | POA: Diagnosis not present

## 2023-08-08 DIAGNOSIS — Z79899 Other long term (current) drug therapy: Secondary | ICD-10-CM | POA: Diagnosis not present

## 2023-08-08 DIAGNOSIS — R0902 Hypoxemia: Secondary | ICD-10-CM | POA: Diagnosis not present

## 2023-08-08 DIAGNOSIS — S199XXA Unspecified injury of neck, initial encounter: Secondary | ICD-10-CM | POA: Diagnosis not present

## 2023-08-08 DIAGNOSIS — R10817 Generalized abdominal tenderness: Secondary | ICD-10-CM | POA: Diagnosis not present

## 2023-08-08 DIAGNOSIS — F0280 Dementia in other diseases classified elsewhere without behavioral disturbance: Secondary | ICD-10-CM | POA: Diagnosis not present

## 2023-08-08 DIAGNOSIS — R410 Disorientation, unspecified: Secondary | ICD-10-CM | POA: Diagnosis not present

## 2023-08-08 DIAGNOSIS — Z7901 Long term (current) use of anticoagulants: Secondary | ICD-10-CM | POA: Diagnosis not present

## 2023-08-08 DIAGNOSIS — W19XXXA Unspecified fall, initial encounter: Secondary | ICD-10-CM | POA: Diagnosis not present

## 2023-08-08 DIAGNOSIS — S0083XA Contusion of other part of head, initial encounter: Secondary | ICD-10-CM | POA: Diagnosis not present

## 2023-08-08 DIAGNOSIS — G309 Alzheimer's disease, unspecified: Secondary | ICD-10-CM | POA: Diagnosis not present

## 2023-08-08 DIAGNOSIS — Z87891 Personal history of nicotine dependence: Secondary | ICD-10-CM | POA: Diagnosis not present

## 2023-08-08 DIAGNOSIS — M546 Pain in thoracic spine: Secondary | ICD-10-CM | POA: Diagnosis not present

## 2023-08-08 DIAGNOSIS — R29810 Facial weakness: Secondary | ICD-10-CM | POA: Diagnosis not present

## 2023-08-08 DIAGNOSIS — Z043 Encounter for examination and observation following other accident: Secondary | ICD-10-CM | POA: Diagnosis not present

## 2023-08-08 DIAGNOSIS — R296 Repeated falls: Secondary | ICD-10-CM | POA: Diagnosis not present

## 2023-08-10 DIAGNOSIS — F319 Bipolar disorder, unspecified: Secondary | ICD-10-CM | POA: Diagnosis not present

## 2023-08-10 DIAGNOSIS — I4821 Permanent atrial fibrillation: Secondary | ICD-10-CM | POA: Diagnosis not present

## 2023-08-10 DIAGNOSIS — I442 Atrioventricular block, complete: Secondary | ICD-10-CM | POA: Diagnosis not present

## 2023-08-10 DIAGNOSIS — F0154 Vascular dementia, unspecified severity, with anxiety: Secondary | ICD-10-CM | POA: Diagnosis not present

## 2023-08-10 DIAGNOSIS — G309 Alzheimer's disease, unspecified: Secondary | ICD-10-CM | POA: Diagnosis not present

## 2023-08-10 DIAGNOSIS — F0153 Vascular dementia, unspecified severity, with mood disturbance: Secondary | ICD-10-CM | POA: Diagnosis not present

## 2023-08-10 DIAGNOSIS — F0283 Dementia in other diseases classified elsewhere, unspecified severity, with mood disturbance: Secondary | ICD-10-CM | POA: Diagnosis not present

## 2023-08-10 DIAGNOSIS — F0284 Dementia in other diseases classified elsewhere, unspecified severity, with anxiety: Secondary | ICD-10-CM | POA: Diagnosis not present

## 2023-08-10 DIAGNOSIS — I10 Essential (primary) hypertension: Secondary | ICD-10-CM | POA: Diagnosis not present

## 2023-08-11 DIAGNOSIS — G26 Extrapyramidal and movement disorders in diseases classified elsewhere: Secondary | ICD-10-CM | POA: Diagnosis not present

## 2023-08-11 DIAGNOSIS — F5105 Insomnia due to other mental disorder: Secondary | ICD-10-CM | POA: Diagnosis not present

## 2023-08-11 DIAGNOSIS — F01518 Vascular dementia, unspecified severity, with other behavioral disturbance: Secondary | ICD-10-CM | POA: Diagnosis not present

## 2023-08-11 DIAGNOSIS — F411 Generalized anxiety disorder: Secondary | ICD-10-CM | POA: Diagnosis not present

## 2023-08-11 DIAGNOSIS — F319 Bipolar disorder, unspecified: Secondary | ICD-10-CM | POA: Diagnosis not present

## 2023-08-11 DIAGNOSIS — G301 Alzheimer's disease with late onset: Secondary | ICD-10-CM | POA: Diagnosis not present

## 2023-08-13 DIAGNOSIS — I2510 Atherosclerotic heart disease of native coronary artery without angina pectoris: Secondary | ICD-10-CM | POA: Diagnosis not present

## 2023-08-13 DIAGNOSIS — G4700 Insomnia, unspecified: Secondary | ICD-10-CM | POA: Diagnosis not present

## 2023-08-13 DIAGNOSIS — F319 Bipolar disorder, unspecified: Secondary | ICD-10-CM | POA: Diagnosis not present

## 2023-08-13 DIAGNOSIS — G309 Alzheimer's disease, unspecified: Secondary | ICD-10-CM | POA: Diagnosis not present

## 2023-08-13 DIAGNOSIS — F01518 Vascular dementia, unspecified severity, with other behavioral disturbance: Secondary | ICD-10-CM | POA: Diagnosis not present

## 2023-08-13 DIAGNOSIS — I4821 Permanent atrial fibrillation: Secondary | ICD-10-CM | POA: Diagnosis not present

## 2023-08-13 DIAGNOSIS — F0154 Vascular dementia, unspecified severity, with anxiety: Secondary | ICD-10-CM | POA: Diagnosis not present

## 2023-08-13 DIAGNOSIS — I10 Essential (primary) hypertension: Secondary | ICD-10-CM | POA: Diagnosis not present

## 2023-08-13 DIAGNOSIS — F0283 Dementia in other diseases classified elsewhere, unspecified severity, with mood disturbance: Secondary | ICD-10-CM | POA: Diagnosis not present

## 2023-08-13 DIAGNOSIS — F0284 Dementia in other diseases classified elsewhere, unspecified severity, with anxiety: Secondary | ICD-10-CM | POA: Diagnosis not present

## 2023-08-13 DIAGNOSIS — I442 Atrioventricular block, complete: Secondary | ICD-10-CM | POA: Diagnosis not present

## 2023-08-13 DIAGNOSIS — F0153 Vascular dementia, unspecified severity, with mood disturbance: Secondary | ICD-10-CM | POA: Diagnosis not present

## 2023-08-13 DIAGNOSIS — E782 Mixed hyperlipidemia: Secondary | ICD-10-CM | POA: Diagnosis not present

## 2023-08-17 DIAGNOSIS — F0283 Dementia in other diseases classified elsewhere, unspecified severity, with mood disturbance: Secondary | ICD-10-CM | POA: Diagnosis not present

## 2023-08-17 DIAGNOSIS — F0153 Vascular dementia, unspecified severity, with mood disturbance: Secondary | ICD-10-CM | POA: Diagnosis not present

## 2023-08-17 DIAGNOSIS — I442 Atrioventricular block, complete: Secondary | ICD-10-CM | POA: Diagnosis not present

## 2023-08-17 DIAGNOSIS — F319 Bipolar disorder, unspecified: Secondary | ICD-10-CM | POA: Diagnosis not present

## 2023-08-17 DIAGNOSIS — G309 Alzheimer's disease, unspecified: Secondary | ICD-10-CM | POA: Diagnosis not present

## 2023-08-17 DIAGNOSIS — F0284 Dementia in other diseases classified elsewhere, unspecified severity, with anxiety: Secondary | ICD-10-CM | POA: Diagnosis not present

## 2023-08-17 DIAGNOSIS — F0154 Vascular dementia, unspecified severity, with anxiety: Secondary | ICD-10-CM | POA: Diagnosis not present

## 2023-08-17 DIAGNOSIS — I4821 Permanent atrial fibrillation: Secondary | ICD-10-CM | POA: Diagnosis not present

## 2023-08-17 DIAGNOSIS — I10 Essential (primary) hypertension: Secondary | ICD-10-CM | POA: Diagnosis not present

## 2023-08-18 DIAGNOSIS — F0153 Vascular dementia, unspecified severity, with mood disturbance: Secondary | ICD-10-CM | POA: Diagnosis not present

## 2023-08-18 DIAGNOSIS — F319 Bipolar disorder, unspecified: Secondary | ICD-10-CM | POA: Diagnosis not present

## 2023-08-18 DIAGNOSIS — I442 Atrioventricular block, complete: Secondary | ICD-10-CM | POA: Diagnosis not present

## 2023-08-18 DIAGNOSIS — F0154 Vascular dementia, unspecified severity, with anxiety: Secondary | ICD-10-CM | POA: Diagnosis not present

## 2023-08-18 DIAGNOSIS — F0283 Dementia in other diseases classified elsewhere, unspecified severity, with mood disturbance: Secondary | ICD-10-CM | POA: Diagnosis not present

## 2023-08-18 DIAGNOSIS — I10 Essential (primary) hypertension: Secondary | ICD-10-CM | POA: Diagnosis not present

## 2023-08-18 DIAGNOSIS — I4821 Permanent atrial fibrillation: Secondary | ICD-10-CM | POA: Diagnosis not present

## 2023-08-18 DIAGNOSIS — F0284 Dementia in other diseases classified elsewhere, unspecified severity, with anxiety: Secondary | ICD-10-CM | POA: Diagnosis not present

## 2023-08-18 DIAGNOSIS — G309 Alzheimer's disease, unspecified: Secondary | ICD-10-CM | POA: Diagnosis not present

## 2023-08-19 DIAGNOSIS — I10 Essential (primary) hypertension: Secondary | ICD-10-CM | POA: Diagnosis not present

## 2023-08-19 DIAGNOSIS — F0154 Vascular dementia, unspecified severity, with anxiety: Secondary | ICD-10-CM | POA: Diagnosis not present

## 2023-08-19 DIAGNOSIS — F0153 Vascular dementia, unspecified severity, with mood disturbance: Secondary | ICD-10-CM | POA: Diagnosis not present

## 2023-08-19 DIAGNOSIS — F319 Bipolar disorder, unspecified: Secondary | ICD-10-CM | POA: Diagnosis not present

## 2023-08-19 DIAGNOSIS — F0284 Dementia in other diseases classified elsewhere, unspecified severity, with anxiety: Secondary | ICD-10-CM | POA: Diagnosis not present

## 2023-08-19 DIAGNOSIS — I4821 Permanent atrial fibrillation: Secondary | ICD-10-CM | POA: Diagnosis not present

## 2023-08-19 DIAGNOSIS — G309 Alzheimer's disease, unspecified: Secondary | ICD-10-CM | POA: Diagnosis not present

## 2023-08-19 DIAGNOSIS — I442 Atrioventricular block, complete: Secondary | ICD-10-CM | POA: Diagnosis not present

## 2023-08-19 DIAGNOSIS — F0283 Dementia in other diseases classified elsewhere, unspecified severity, with mood disturbance: Secondary | ICD-10-CM | POA: Diagnosis not present

## 2023-08-21 DIAGNOSIS — R58 Hemorrhage, not elsewhere classified: Secondary | ICD-10-CM | POA: Diagnosis not present

## 2023-08-21 DIAGNOSIS — S51812A Laceration without foreign body of left forearm, initial encounter: Secondary | ICD-10-CM | POA: Diagnosis not present

## 2023-08-21 DIAGNOSIS — K668 Other specified disorders of peritoneum: Secondary | ICD-10-CM | POA: Diagnosis not present

## 2023-08-21 DIAGNOSIS — S0091XA Abrasion of unspecified part of head, initial encounter: Secondary | ICD-10-CM | POA: Diagnosis not present

## 2023-08-21 DIAGNOSIS — M4696 Unspecified inflammatory spondylopathy, lumbar region: Secondary | ICD-10-CM | POA: Diagnosis not present

## 2023-08-21 DIAGNOSIS — M47814 Spondylosis without myelopathy or radiculopathy, thoracic region: Secondary | ICD-10-CM | POA: Diagnosis not present

## 2023-08-21 DIAGNOSIS — R590 Localized enlarged lymph nodes: Secondary | ICD-10-CM | POA: Diagnosis not present

## 2023-08-21 DIAGNOSIS — R911 Solitary pulmonary nodule: Secondary | ICD-10-CM | POA: Diagnosis not present

## 2023-08-21 DIAGNOSIS — I10 Essential (primary) hypertension: Secondary | ICD-10-CM | POA: Diagnosis not present

## 2023-08-21 DIAGNOSIS — I2510 Atherosclerotic heart disease of native coronary artery without angina pectoris: Secondary | ICD-10-CM | POA: Diagnosis not present

## 2023-08-21 DIAGNOSIS — R918 Other nonspecific abnormal finding of lung field: Secondary | ICD-10-CM | POA: Diagnosis not present

## 2023-08-21 DIAGNOSIS — S301XXA Contusion of abdominal wall, initial encounter: Secondary | ICD-10-CM | POA: Diagnosis not present

## 2023-08-21 DIAGNOSIS — M50322 Other cervical disc degeneration at C5-C6 level: Secondary | ICD-10-CM | POA: Diagnosis not present

## 2023-08-21 DIAGNOSIS — Z043 Encounter for examination and observation following other accident: Secondary | ICD-10-CM | POA: Diagnosis not present

## 2023-08-21 DIAGNOSIS — S61012A Laceration without foreign body of left thumb without damage to nail, initial encounter: Secondary | ICD-10-CM | POA: Diagnosis not present

## 2023-08-21 DIAGNOSIS — S20222A Contusion of left back wall of thorax, initial encounter: Secondary | ICD-10-CM | POA: Diagnosis not present

## 2023-08-21 DIAGNOSIS — R599 Enlarged lymph nodes, unspecified: Secondary | ICD-10-CM | POA: Diagnosis not present

## 2023-08-21 DIAGNOSIS — S51012A Laceration without foreign body of left elbow, initial encounter: Secondary | ICD-10-CM | POA: Diagnosis not present

## 2023-08-21 DIAGNOSIS — M2578 Osteophyte, vertebrae: Secondary | ICD-10-CM | POA: Diagnosis not present

## 2023-08-21 DIAGNOSIS — Z87891 Personal history of nicotine dependence: Secondary | ICD-10-CM | POA: Diagnosis not present

## 2023-08-21 DIAGNOSIS — M4810 Ankylosing hyperostosis [Forestier], site unspecified: Secondary | ICD-10-CM | POA: Diagnosis not present

## 2023-08-21 DIAGNOSIS — M5136 Other intervertebral disc degeneration, lumbar region with discogenic back pain only: Secondary | ICD-10-CM | POA: Diagnosis not present

## 2023-08-21 DIAGNOSIS — M47812 Spondylosis without myelopathy or radiculopathy, cervical region: Secondary | ICD-10-CM | POA: Diagnosis not present

## 2023-08-21 DIAGNOSIS — K6389 Other specified diseases of intestine: Secondary | ICD-10-CM | POA: Diagnosis not present

## 2023-08-21 DIAGNOSIS — S299XXA Unspecified injury of thorax, initial encounter: Secondary | ICD-10-CM | POA: Diagnosis not present

## 2023-08-21 DIAGNOSIS — S199XXA Unspecified injury of neck, initial encounter: Secondary | ICD-10-CM | POA: Diagnosis not present

## 2023-08-21 DIAGNOSIS — S3991XA Unspecified injury of abdomen, initial encounter: Secondary | ICD-10-CM | POA: Diagnosis not present

## 2023-08-21 DIAGNOSIS — I6389 Other cerebral infarction: Secondary | ICD-10-CM | POA: Diagnosis not present

## 2023-08-21 DIAGNOSIS — I3139 Other pericardial effusion (noninflammatory): Secondary | ICD-10-CM | POA: Diagnosis not present

## 2023-08-21 DIAGNOSIS — S20212A Contusion of left front wall of thorax, initial encounter: Secondary | ICD-10-CM | POA: Diagnosis not present

## 2023-08-21 DIAGNOSIS — S0081XA Abrasion of other part of head, initial encounter: Secondary | ICD-10-CM | POA: Diagnosis not present

## 2023-08-21 DIAGNOSIS — D7389 Other diseases of spleen: Secondary | ICD-10-CM | POA: Diagnosis not present

## 2023-08-21 DIAGNOSIS — S3992XA Unspecified injury of lower back, initial encounter: Secondary | ICD-10-CM | POA: Diagnosis not present

## 2023-08-21 DIAGNOSIS — J9811 Atelectasis: Secondary | ICD-10-CM | POA: Diagnosis not present

## 2023-08-21 DIAGNOSIS — J90 Pleural effusion, not elsewhere classified: Secondary | ICD-10-CM | POA: Diagnosis not present

## 2023-08-21 DIAGNOSIS — I517 Cardiomegaly: Secondary | ICD-10-CM | POA: Diagnosis not present

## 2023-08-22 DIAGNOSIS — F0154 Vascular dementia, unspecified severity, with anxiety: Secondary | ICD-10-CM | POA: Diagnosis not present

## 2023-08-22 DIAGNOSIS — M2578 Osteophyte, vertebrae: Secondary | ICD-10-CM | POA: Diagnosis not present

## 2023-08-22 DIAGNOSIS — F0153 Vascular dementia, unspecified severity, with mood disturbance: Secondary | ICD-10-CM | POA: Diagnosis not present

## 2023-08-22 DIAGNOSIS — S20222A Contusion of left back wall of thorax, initial encounter: Secondary | ICD-10-CM | POA: Diagnosis not present

## 2023-08-22 DIAGNOSIS — M50322 Other cervical disc degeneration at C5-C6 level: Secondary | ICD-10-CM | POA: Diagnosis not present

## 2023-08-22 DIAGNOSIS — Z043 Encounter for examination and observation following other accident: Secondary | ICD-10-CM | POA: Diagnosis not present

## 2023-08-22 DIAGNOSIS — F0284 Dementia in other diseases classified elsewhere, unspecified severity, with anxiety: Secondary | ICD-10-CM | POA: Diagnosis not present

## 2023-08-22 DIAGNOSIS — D7389 Other diseases of spleen: Secondary | ICD-10-CM | POA: Diagnosis not present

## 2023-08-22 DIAGNOSIS — R918 Other nonspecific abnormal finding of lung field: Secondary | ICD-10-CM | POA: Diagnosis not present

## 2023-08-22 DIAGNOSIS — M4696 Unspecified inflammatory spondylopathy, lumbar region: Secondary | ICD-10-CM | POA: Diagnosis not present

## 2023-08-22 DIAGNOSIS — I10 Essential (primary) hypertension: Secondary | ICD-10-CM | POA: Diagnosis not present

## 2023-08-22 DIAGNOSIS — M47812 Spondylosis without myelopathy or radiculopathy, cervical region: Secondary | ICD-10-CM | POA: Diagnosis not present

## 2023-08-22 DIAGNOSIS — W19XXXA Unspecified fall, initial encounter: Secondary | ICD-10-CM | POA: Diagnosis not present

## 2023-08-22 DIAGNOSIS — R590 Localized enlarged lymph nodes: Secondary | ICD-10-CM | POA: Diagnosis not present

## 2023-08-22 DIAGNOSIS — I4891 Unspecified atrial fibrillation: Secondary | ICD-10-CM | POA: Diagnosis not present

## 2023-08-22 DIAGNOSIS — Z45018 Encounter for adjustment and management of other part of cardiac pacemaker: Secondary | ICD-10-CM | POA: Diagnosis not present

## 2023-08-22 DIAGNOSIS — G309 Alzheimer's disease, unspecified: Secondary | ICD-10-CM | POA: Diagnosis not present

## 2023-08-22 DIAGNOSIS — K6389 Other specified diseases of intestine: Secondary | ICD-10-CM | POA: Diagnosis not present

## 2023-08-22 DIAGNOSIS — F319 Bipolar disorder, unspecified: Secondary | ICD-10-CM | POA: Diagnosis not present

## 2023-08-22 DIAGNOSIS — I4821 Permanent atrial fibrillation: Secondary | ICD-10-CM | POA: Diagnosis not present

## 2023-08-22 DIAGNOSIS — K668 Other specified disorders of peritoneum: Secondary | ICD-10-CM | POA: Diagnosis not present

## 2023-08-22 DIAGNOSIS — I442 Atrioventricular block, complete: Secondary | ICD-10-CM | POA: Diagnosis not present

## 2023-08-22 DIAGNOSIS — M5136 Other intervertebral disc degeneration, lumbar region with discogenic back pain only: Secondary | ICD-10-CM | POA: Diagnosis not present

## 2023-08-22 DIAGNOSIS — M4810 Ankylosing hyperostosis [Forestier], site unspecified: Secondary | ICD-10-CM | POA: Diagnosis not present

## 2023-08-22 DIAGNOSIS — I517 Cardiomegaly: Secondary | ICD-10-CM | POA: Diagnosis not present

## 2023-08-22 DIAGNOSIS — J90 Pleural effusion, not elsewhere classified: Secondary | ICD-10-CM | POA: Diagnosis not present

## 2023-08-22 DIAGNOSIS — G459 Transient cerebral ischemic attack, unspecified: Secondary | ICD-10-CM | POA: Diagnosis not present

## 2023-08-22 DIAGNOSIS — Z743 Need for continuous supervision: Secondary | ICD-10-CM | POA: Diagnosis not present

## 2023-08-22 DIAGNOSIS — S301XXA Contusion of abdominal wall, initial encounter: Secondary | ICD-10-CM | POA: Diagnosis not present

## 2023-08-22 DIAGNOSIS — F0283 Dementia in other diseases classified elsewhere, unspecified severity, with mood disturbance: Secondary | ICD-10-CM | POA: Diagnosis not present

## 2023-08-22 DIAGNOSIS — J9811 Atelectasis: Secondary | ICD-10-CM | POA: Diagnosis not present

## 2023-08-22 DIAGNOSIS — M47814 Spondylosis without myelopathy or radiculopathy, thoracic region: Secondary | ICD-10-CM | POA: Diagnosis not present

## 2023-08-22 DIAGNOSIS — I3139 Other pericardial effusion (noninflammatory): Secondary | ICD-10-CM | POA: Diagnosis not present

## 2023-08-23 DIAGNOSIS — R4182 Altered mental status, unspecified: Secondary | ICD-10-CM | POA: Diagnosis not present

## 2023-08-23 DIAGNOSIS — M6281 Muscle weakness (generalized): Secondary | ICD-10-CM | POA: Diagnosis not present

## 2023-08-25 DIAGNOSIS — F5105 Insomnia due to other mental disorder: Secondary | ICD-10-CM | POA: Diagnosis not present

## 2023-08-25 DIAGNOSIS — F411 Generalized anxiety disorder: Secondary | ICD-10-CM | POA: Diagnosis not present

## 2023-08-27 DIAGNOSIS — F0154 Vascular dementia, unspecified severity, with anxiety: Secondary | ICD-10-CM | POA: Diagnosis not present

## 2023-08-27 DIAGNOSIS — W19XXXA Unspecified fall, initial encounter: Secondary | ICD-10-CM | POA: Diagnosis not present

## 2023-08-27 DIAGNOSIS — J90 Pleural effusion, not elsewhere classified: Secondary | ICD-10-CM | POA: Diagnosis not present

## 2023-08-27 DIAGNOSIS — F0283 Dementia in other diseases classified elsewhere, unspecified severity, with mood disturbance: Secondary | ICD-10-CM | POA: Diagnosis not present

## 2023-08-27 DIAGNOSIS — F0153 Vascular dementia, unspecified severity, with mood disturbance: Secondary | ICD-10-CM | POA: Diagnosis not present

## 2023-08-27 DIAGNOSIS — F250 Schizoaffective disorder, bipolar type: Secondary | ICD-10-CM | POA: Diagnosis not present

## 2023-08-27 DIAGNOSIS — I4821 Permanent atrial fibrillation: Secondary | ICD-10-CM | POA: Diagnosis not present

## 2023-08-27 DIAGNOSIS — I442 Atrioventricular block, complete: Secondary | ICD-10-CM | POA: Diagnosis not present

## 2023-08-27 DIAGNOSIS — D374 Neoplasm of uncertain behavior of colon: Secondary | ICD-10-CM | POA: Diagnosis not present

## 2023-08-27 DIAGNOSIS — F319 Bipolar disorder, unspecified: Secondary | ICD-10-CM | POA: Diagnosis not present

## 2023-08-27 DIAGNOSIS — F0284 Dementia in other diseases classified elsewhere, unspecified severity, with anxiety: Secondary | ICD-10-CM | POA: Diagnosis not present

## 2023-08-27 DIAGNOSIS — I10 Essential (primary) hypertension: Secondary | ICD-10-CM | POA: Diagnosis not present

## 2023-08-27 DIAGNOSIS — R54 Age-related physical debility: Secondary | ICD-10-CM | POA: Diagnosis not present

## 2023-08-27 DIAGNOSIS — D487 Neoplasm of uncertain behavior of other specified sites: Secondary | ICD-10-CM | POA: Diagnosis not present

## 2023-08-27 DIAGNOSIS — Z09 Encounter for follow-up examination after completed treatment for conditions other than malignant neoplasm: Secondary | ICD-10-CM | POA: Diagnosis not present

## 2023-08-27 DIAGNOSIS — G4700 Insomnia, unspecified: Secondary | ICD-10-CM | POA: Diagnosis not present

## 2023-08-27 DIAGNOSIS — G309 Alzheimer's disease, unspecified: Secondary | ICD-10-CM | POA: Diagnosis not present

## 2023-08-31 DIAGNOSIS — E785 Hyperlipidemia, unspecified: Secondary | ICD-10-CM | POA: Diagnosis not present

## 2023-08-31 DIAGNOSIS — D649 Anemia, unspecified: Secondary | ICD-10-CM | POA: Diagnosis not present

## 2023-08-31 DIAGNOSIS — G309 Alzheimer's disease, unspecified: Secondary | ICD-10-CM | POA: Diagnosis not present

## 2023-08-31 DIAGNOSIS — F319 Bipolar disorder, unspecified: Secondary | ICD-10-CM | POA: Diagnosis not present

## 2023-08-31 DIAGNOSIS — I10 Essential (primary) hypertension: Secondary | ICD-10-CM | POA: Diagnosis not present

## 2023-08-31 DIAGNOSIS — D696 Thrombocytopenia, unspecified: Secondary | ICD-10-CM | POA: Diagnosis not present

## 2023-08-31 DIAGNOSIS — F250 Schizoaffective disorder, bipolar type: Secondary | ICD-10-CM | POA: Diagnosis not present

## 2023-08-31 DIAGNOSIS — F0390 Unspecified dementia without behavioral disturbance: Secondary | ICD-10-CM | POA: Diagnosis not present

## 2023-08-31 DIAGNOSIS — F411 Generalized anxiety disorder: Secondary | ICD-10-CM | POA: Diagnosis not present

## 2023-09-02 ENCOUNTER — Telehealth: Payer: Self-pay

## 2023-09-02 DIAGNOSIS — I4821 Permanent atrial fibrillation: Secondary | ICD-10-CM | POA: Diagnosis not present

## 2023-09-02 DIAGNOSIS — G309 Alzheimer's disease, unspecified: Secondary | ICD-10-CM | POA: Diagnosis not present

## 2023-09-02 DIAGNOSIS — F319 Bipolar disorder, unspecified: Secondary | ICD-10-CM | POA: Diagnosis not present

## 2023-09-02 DIAGNOSIS — I442 Atrioventricular block, complete: Secondary | ICD-10-CM | POA: Diagnosis not present

## 2023-09-02 DIAGNOSIS — I10 Essential (primary) hypertension: Secondary | ICD-10-CM | POA: Diagnosis not present

## 2023-09-02 DIAGNOSIS — F0284 Dementia in other diseases classified elsewhere, unspecified severity, with anxiety: Secondary | ICD-10-CM | POA: Diagnosis not present

## 2023-09-02 DIAGNOSIS — F0153 Vascular dementia, unspecified severity, with mood disturbance: Secondary | ICD-10-CM | POA: Diagnosis not present

## 2023-09-02 DIAGNOSIS — F0283 Dementia in other diseases classified elsewhere, unspecified severity, with mood disturbance: Secondary | ICD-10-CM | POA: Diagnosis not present

## 2023-09-02 DIAGNOSIS — F0154 Vascular dementia, unspecified severity, with anxiety: Secondary | ICD-10-CM | POA: Diagnosis not present

## 2023-09-02 NOTE — Telephone Encounter (Signed)
 Attempted to call patient to advise of cancellation of July 8th appointment. Office staff advised not appropriate appointment Patient is scheduled for 7/22.  Called patient--No answer--Left generic voicemail for a call back Called patient's daughter Traci---No answer

## 2023-09-03 DIAGNOSIS — R54 Age-related physical debility: Secondary | ICD-10-CM | POA: Diagnosis not present

## 2023-09-03 DIAGNOSIS — F0283 Dementia in other diseases classified elsewhere, unspecified severity, with mood disturbance: Secondary | ICD-10-CM | POA: Diagnosis not present

## 2023-09-03 DIAGNOSIS — D487 Neoplasm of uncertain behavior of other specified sites: Secondary | ICD-10-CM | POA: Diagnosis not present

## 2023-09-03 DIAGNOSIS — M6284 Sarcopenia: Secondary | ICD-10-CM | POA: Diagnosis not present

## 2023-09-03 DIAGNOSIS — D374 Neoplasm of uncertain behavior of colon: Secondary | ICD-10-CM | POA: Diagnosis not present

## 2023-09-03 DIAGNOSIS — F0153 Vascular dementia, unspecified severity, with mood disturbance: Secondary | ICD-10-CM | POA: Diagnosis not present

## 2023-09-03 DIAGNOSIS — G309 Alzheimer's disease, unspecified: Secondary | ICD-10-CM | POA: Diagnosis not present

## 2023-09-03 DIAGNOSIS — I442 Atrioventricular block, complete: Secondary | ICD-10-CM | POA: Diagnosis not present

## 2023-09-03 DIAGNOSIS — F0284 Dementia in other diseases classified elsewhere, unspecified severity, with anxiety: Secondary | ICD-10-CM | POA: Diagnosis not present

## 2023-09-03 DIAGNOSIS — I10 Essential (primary) hypertension: Secondary | ICD-10-CM | POA: Diagnosis not present

## 2023-09-03 DIAGNOSIS — R296 Repeated falls: Secondary | ICD-10-CM | POA: Diagnosis not present

## 2023-09-03 DIAGNOSIS — Z09 Encounter for follow-up examination after completed treatment for conditions other than malignant neoplasm: Secondary | ICD-10-CM | POA: Diagnosis not present

## 2023-09-03 DIAGNOSIS — F0154 Vascular dementia, unspecified severity, with anxiety: Secondary | ICD-10-CM | POA: Diagnosis not present

## 2023-09-03 DIAGNOSIS — F319 Bipolar disorder, unspecified: Secondary | ICD-10-CM | POA: Diagnosis not present

## 2023-09-03 DIAGNOSIS — J90 Pleural effusion, not elsewhere classified: Secondary | ICD-10-CM | POA: Diagnosis not present

## 2023-09-03 DIAGNOSIS — W19XXXA Unspecified fall, initial encounter: Secondary | ICD-10-CM | POA: Diagnosis not present

## 2023-09-03 DIAGNOSIS — I4821 Permanent atrial fibrillation: Secondary | ICD-10-CM | POA: Diagnosis not present

## 2023-09-03 NOTE — Telephone Encounter (Signed)
 Spoke with patient's wife and advised her about the appointment on 7/8 being cancelled and their appointment being moved to 7/22.  Advised her of the date, time, location, arriving early instructions, and she is advised to call back with any questions or concerns---and she is advised that if anything happens or worsens with anything the Emergency Room is always open. She appreciated the call and verbalized understanding.

## 2023-09-07 ENCOUNTER — Ambulatory Visit: Payer: Medicare PPO

## 2023-09-08 ENCOUNTER — Ambulatory Visit: Payer: Medicare PPO

## 2023-09-08 ENCOUNTER — Ambulatory Visit: Admitting: Internal Medicine

## 2023-09-08 DIAGNOSIS — G301 Alzheimer's disease with late onset: Secondary | ICD-10-CM | POA: Diagnosis not present

## 2023-09-08 DIAGNOSIS — F01518 Vascular dementia, unspecified severity, with other behavioral disturbance: Secondary | ICD-10-CM | POA: Diagnosis not present

## 2023-09-08 DIAGNOSIS — R55 Syncope and collapse: Secondary | ICD-10-CM

## 2023-09-08 DIAGNOSIS — G26 Extrapyramidal and movement disorders in diseases classified elsewhere: Secondary | ICD-10-CM | POA: Diagnosis not present

## 2023-09-08 DIAGNOSIS — F411 Generalized anxiety disorder: Secondary | ICD-10-CM | POA: Diagnosis not present

## 2023-09-08 DIAGNOSIS — F5105 Insomnia due to other mental disorder: Secondary | ICD-10-CM | POA: Diagnosis not present

## 2023-09-08 DIAGNOSIS — F3113 Bipolar disorder, current episode manic without psychotic features, severe: Secondary | ICD-10-CM | POA: Diagnosis not present

## 2023-09-09 ENCOUNTER — Encounter: Payer: Self-pay | Admitting: *Deleted

## 2023-09-09 LAB — CUP PACEART REMOTE DEVICE CHECK
Battery Remaining Longevity: 183 mo
Battery Voltage: 3.21 V
Brady Statistic RV Percent Paced: 16.9 %
Date Time Interrogation Session: 20250707213632
Implantable Lead Connection Status: 753985
Implantable Lead Implant Date: 20250221
Implantable Lead Location: 753860
Implantable Lead Model: 5076
Implantable Pulse Generator Implant Date: 20250221
Lead Channel Impedance Value: 361 Ohm
Lead Channel Impedance Value: 570 Ohm
Lead Channel Pacing Threshold Amplitude: 0.875 V
Lead Channel Pacing Threshold Pulse Width: 0.4 ms
Lead Channel Sensing Intrinsic Amplitude: 12.375 mV
Lead Channel Sensing Intrinsic Amplitude: 12.375 mV
Lead Channel Setting Pacing Amplitude: 2 V
Lead Channel Setting Pacing Pulse Width: 0.4 ms
Lead Channel Setting Sensing Sensitivity: 1.2 mV
Zone Setting Status: 755011

## 2023-09-09 NOTE — Progress Notes (Unsigned)
 Spoke with wife and she is scheduling GI appt with [facility] for probable colonoscopy and has appt with [facility] on 7/8 for continued work up Provided contact number to call and will defer referral as pt continues work up

## 2023-09-10 DIAGNOSIS — F0283 Dementia in other diseases classified elsewhere, unspecified severity, with mood disturbance: Secondary | ICD-10-CM | POA: Diagnosis not present

## 2023-09-10 DIAGNOSIS — D487 Neoplasm of uncertain behavior of other specified sites: Secondary | ICD-10-CM | POA: Diagnosis not present

## 2023-09-10 DIAGNOSIS — I10 Essential (primary) hypertension: Secondary | ICD-10-CM | POA: Diagnosis not present

## 2023-09-10 DIAGNOSIS — F319 Bipolar disorder, unspecified: Secondary | ICD-10-CM | POA: Diagnosis not present

## 2023-09-10 DIAGNOSIS — W19XXXA Unspecified fall, initial encounter: Secondary | ICD-10-CM | POA: Diagnosis not present

## 2023-09-10 DIAGNOSIS — R54 Age-related physical debility: Secondary | ICD-10-CM | POA: Diagnosis not present

## 2023-09-10 DIAGNOSIS — I442 Atrioventricular block, complete: Secondary | ICD-10-CM | POA: Diagnosis not present

## 2023-09-10 DIAGNOSIS — I4821 Permanent atrial fibrillation: Secondary | ICD-10-CM | POA: Diagnosis not present

## 2023-09-10 DIAGNOSIS — F0153 Vascular dementia, unspecified severity, with mood disturbance: Secondary | ICD-10-CM | POA: Diagnosis not present

## 2023-09-10 DIAGNOSIS — F0154 Vascular dementia, unspecified severity, with anxiety: Secondary | ICD-10-CM | POA: Diagnosis not present

## 2023-09-10 DIAGNOSIS — D374 Neoplasm of uncertain behavior of colon: Secondary | ICD-10-CM | POA: Diagnosis not present

## 2023-09-10 DIAGNOSIS — M6284 Sarcopenia: Secondary | ICD-10-CM | POA: Diagnosis not present

## 2023-09-10 DIAGNOSIS — F0284 Dementia in other diseases classified elsewhere, unspecified severity, with anxiety: Secondary | ICD-10-CM | POA: Diagnosis not present

## 2023-09-10 DIAGNOSIS — Z09 Encounter for follow-up examination after completed treatment for conditions other than malignant neoplasm: Secondary | ICD-10-CM | POA: Diagnosis not present

## 2023-09-10 DIAGNOSIS — J90 Pleural effusion, not elsewhere classified: Secondary | ICD-10-CM | POA: Diagnosis not present

## 2023-09-10 DIAGNOSIS — G309 Alzheimer's disease, unspecified: Secondary | ICD-10-CM | POA: Diagnosis not present

## 2023-09-11 DIAGNOSIS — R451 Restlessness and agitation: Secondary | ICD-10-CM | POA: Diagnosis not present

## 2023-09-15 DIAGNOSIS — I442 Atrioventricular block, complete: Secondary | ICD-10-CM | POA: Diagnosis not present

## 2023-09-15 DIAGNOSIS — F0284 Dementia in other diseases classified elsewhere, unspecified severity, with anxiety: Secondary | ICD-10-CM | POA: Diagnosis not present

## 2023-09-15 DIAGNOSIS — G309 Alzheimer's disease, unspecified: Secondary | ICD-10-CM | POA: Diagnosis not present

## 2023-09-15 DIAGNOSIS — I10 Essential (primary) hypertension: Secondary | ICD-10-CM | POA: Diagnosis not present

## 2023-09-15 DIAGNOSIS — F0153 Vascular dementia, unspecified severity, with mood disturbance: Secondary | ICD-10-CM | POA: Diagnosis not present

## 2023-09-15 DIAGNOSIS — F0283 Dementia in other diseases classified elsewhere, unspecified severity, with mood disturbance: Secondary | ICD-10-CM | POA: Diagnosis not present

## 2023-09-15 DIAGNOSIS — F319 Bipolar disorder, unspecified: Secondary | ICD-10-CM | POA: Diagnosis not present

## 2023-09-15 DIAGNOSIS — F0154 Vascular dementia, unspecified severity, with anxiety: Secondary | ICD-10-CM | POA: Diagnosis not present

## 2023-09-15 DIAGNOSIS — I4821 Permanent atrial fibrillation: Secondary | ICD-10-CM | POA: Diagnosis not present

## 2023-09-16 DIAGNOSIS — F0280 Dementia in other diseases classified elsewhere without behavioral disturbance: Secondary | ICD-10-CM | POA: Diagnosis not present

## 2023-09-16 DIAGNOSIS — R54 Age-related physical debility: Secondary | ICD-10-CM | POA: Diagnosis not present

## 2023-09-16 DIAGNOSIS — R451 Restlessness and agitation: Secondary | ICD-10-CM | POA: Diagnosis not present

## 2023-09-17 ENCOUNTER — Ambulatory Visit: Payer: Self-pay | Admitting: Cardiology

## 2023-09-17 DIAGNOSIS — F0154 Vascular dementia, unspecified severity, with anxiety: Secondary | ICD-10-CM | POA: Diagnosis not present

## 2023-09-17 DIAGNOSIS — I4821 Permanent atrial fibrillation: Secondary | ICD-10-CM | POA: Diagnosis not present

## 2023-09-17 DIAGNOSIS — F319 Bipolar disorder, unspecified: Secondary | ICD-10-CM | POA: Diagnosis not present

## 2023-09-17 DIAGNOSIS — I442 Atrioventricular block, complete: Secondary | ICD-10-CM | POA: Diagnosis not present

## 2023-09-17 DIAGNOSIS — I10 Essential (primary) hypertension: Secondary | ICD-10-CM | POA: Diagnosis not present

## 2023-09-17 DIAGNOSIS — F0153 Vascular dementia, unspecified severity, with mood disturbance: Secondary | ICD-10-CM | POA: Diagnosis not present

## 2023-09-17 DIAGNOSIS — F0283 Dementia in other diseases classified elsewhere, unspecified severity, with mood disturbance: Secondary | ICD-10-CM | POA: Diagnosis not present

## 2023-09-17 DIAGNOSIS — F0284 Dementia in other diseases classified elsewhere, unspecified severity, with anxiety: Secondary | ICD-10-CM | POA: Diagnosis not present

## 2023-09-17 DIAGNOSIS — G309 Alzheimer's disease, unspecified: Secondary | ICD-10-CM | POA: Diagnosis not present

## 2023-09-18 DIAGNOSIS — F0283 Dementia in other diseases classified elsewhere, unspecified severity, with mood disturbance: Secondary | ICD-10-CM | POA: Diagnosis not present

## 2023-09-18 DIAGNOSIS — F0153 Vascular dementia, unspecified severity, with mood disturbance: Secondary | ICD-10-CM | POA: Diagnosis not present

## 2023-09-18 DIAGNOSIS — F319 Bipolar disorder, unspecified: Secondary | ICD-10-CM | POA: Diagnosis not present

## 2023-09-18 DIAGNOSIS — G309 Alzheimer's disease, unspecified: Secondary | ICD-10-CM | POA: Diagnosis not present

## 2023-09-18 DIAGNOSIS — I10 Essential (primary) hypertension: Secondary | ICD-10-CM | POA: Diagnosis not present

## 2023-09-18 DIAGNOSIS — F0154 Vascular dementia, unspecified severity, with anxiety: Secondary | ICD-10-CM | POA: Diagnosis not present

## 2023-09-18 DIAGNOSIS — I442 Atrioventricular block, complete: Secondary | ICD-10-CM | POA: Diagnosis not present

## 2023-09-18 DIAGNOSIS — I4821 Permanent atrial fibrillation: Secondary | ICD-10-CM | POA: Diagnosis not present

## 2023-09-18 DIAGNOSIS — F0284 Dementia in other diseases classified elsewhere, unspecified severity, with anxiety: Secondary | ICD-10-CM | POA: Diagnosis not present

## 2023-09-22 ENCOUNTER — Ambulatory Visit (INDEPENDENT_AMBULATORY_CARE_PROVIDER_SITE_OTHER): Admitting: Pulmonary Disease

## 2023-09-22 ENCOUNTER — Encounter: Payer: Self-pay | Admitting: Pulmonary Disease

## 2023-09-22 VITALS — BP 175/71 | HR 63 | Ht 71.0 in | Wt 170.0 lb

## 2023-09-22 DIAGNOSIS — R9389 Abnormal findings on diagnostic imaging of other specified body structures: Secondary | ICD-10-CM | POA: Diagnosis not present

## 2023-09-22 DIAGNOSIS — F411 Generalized anxiety disorder: Secondary | ICD-10-CM | POA: Diagnosis not present

## 2023-09-22 DIAGNOSIS — F01518 Vascular dementia, unspecified severity, with other behavioral disturbance: Secondary | ICD-10-CM | POA: Diagnosis not present

## 2023-09-22 NOTE — Progress Notes (Signed)
 @Patient  ID: [name] [name], male    DOB: Sep 14, 1945, 78 y.o.   MRN: [id number]  Chief Complaint  Patient presents with   Consult    Pt states  nodules     Referring provider: No ref. provider found  HPI:   78 y.o. man whom are seen for evaluation of abnormal lung imaging.  Multiple ED notes reviewed.  Patient has severe dementia.  Lives at memory care unit.  He is pleasant.  He is alert.  He is cooperative.  But he cannot participate really in any questioning.  Wife is present and provides most history.  He has had multiple falls.  Had a fall 01/2023 ended up in the hospital for about 2 weeks.  CT scan abdomen pelvis that time demonstrated to left lower lobe nodules largest 1.6 mm both groundglass as well as a 3 x 2 x 2  small intestine mass.  Wife states she is not aware of this.  She states she was not informed.  He has had 3 falls in June 2025.  All evaluated at the [facility].  First CT chest 08/2023 demonstrates mediastinal mass 3 to 4 cm although does not specify location as well as left upper lobe opacity.  Repeat CT scan about 2 weeks later is essentially unchanged per report.  Repeat CT scan 1 day later, third CT scan, unchanged.  Recommended 13-month follow-up.  Less the reason for referral.  Notably cecal mass is increased to 4 x 3 x 3 roughly.  He has upcoming follow-up for this.  We discussed at length the possible etiologies the fact that the lower lobe nodules 01/2023 were not seen in the 2025 scans.  This could be due to compressive atelectasis there is reports of bilateral small effusions.  Really gone away.  We discussed possibly of cancer.  Given his advanced cognitive impairment he is likely not a candidate for any treatment.  Begs a question should we go through additional evaluation.  I think his cancer for biopsy is limited as well.  I encouraged her to discuss with family and think about neck steps.  We discussed tentative plan for repeat imaging as  below.  Questionaires / Pulmonary Flowsheets:   ACT:      No data to display          MMRC:     No data to display          Epworth:     02/14/2021    1:00 PM  Results of the Epworth flowsheet  Sitting and reading 2  Watching TV 2  Sitting, inactive in a public place (e.g. a theatre or a meeting) 2  As a passenger in a car for an hour without a break 3  Lying down to rest in the afternoon when circumstances permit 2  Sitting and talking to someone 1  Sitting quietly after a lunch without alcohol  2  In a car, while stopped for a few minutes in traffic 3  Total score 17    Tests:   FENO:  No results found for: NITRICOXIDE  PFT:     No data to display          WALK:      No data to display          Imaging: Personally reviewed and as per EMR discussion of this note CUP PACEART REMOTE DEVICE CHECK Result Date: 09/09/2023 PPM Scheduled remote reviewed. Normal device function.  Presenting rhythm:  VS,  irregular R-R, VVI. History of AF, no OAC (contraindicated) per Epic. Next remote 91 days. - CS, CVRS   Lab Results: Personally reviewed CBC    Component Value Date/Time   WBC 7.5 04/25/2023 0336   RBC 3.15 (L) 04/25/2023 0336   HGB 9.5 (L) 04/25/2023 0336   HCT 29.4 (L) 04/25/2023 0336   PLT 154 04/25/2023 0336   MCV 93.3 04/25/2023 0336   MCH 30.2 04/25/2023 0336   MCHC 32.3 04/25/2023 0336   RDW 14.0 04/25/2023 0336   LYMPHSABS 1.0 01/03/2023 2348   MONOABS 0.4 01/03/2023 2348   EOSABS 0.1 01/03/2023 2348   BASOSABS 0.1 01/03/2023 2348    BMET    Component Value Date/Time   NA 142 04/25/2023 0336   NA 144 02/18/2016 0000   K 4.3 04/25/2023 0336   CL 102 04/25/2023 0336   CO2 27 04/25/2023 0336   GLUCOSE 100 (H) 04/25/2023 0336   BUN 17 04/25/2023 0336   BUN 18 02/18/2016 0000   CREATININE 1.17 04/25/2023 0336   CREATININE 1.01 07/27/2013 0001   CALCIUM  10.3 04/25/2023 0336   GFRNONAA >60 04/25/2023 0336   GFRAA >60  06/05/2017 0517    BNP No results found for: BNP  ProBNP No results found for: PROBNP  Specialty Problems       Pulmonary Problems   Obstructive sleep apnea   OSA (obstructive sleep apnea)    Allergies  Allergen Reactions   Latex Rash and Other (See Comments)    Reaction to knee wraps    Immunization History  Administered Date(s) Administered   DTaP 01/11/2015   Influenza, High Dose Seasonal PF 11/09/2014, 10/31/2015, 11/17/2016   Influenza, Quadrivalent, Recombinant, Inj, Pf 11/26/2017, 12/08/2018, 12/09/2019, 01/01/2021   Influenza-Unspecified 12/01/2016   PFIZER(Purple Top)SARS-COV-2 Vaccination 04/07/2019, 05/02/2019, 12/31/2019   Pneumococcal Conjugate-13 01/08/2015   Tdap 08/16/2007, 09/20/2021   Zoster Recombinant(Shingrix) 11/26/2017, 02/02/2018    Past Medical History:  Diagnosis Date   Alzheimer disease (HCC)    Anxiety    Arthritis    Atrial fibrillation (HCC)    Chronic   Bipolar disorder (HCC)    Chronic low back pain    Dementia (HCC)    Depression    Dizziness    Dysrhythmia    a fib   ED (erectile dysfunction)    External hemorrhoids    Fatigue    HLD (hyperlipidemia)    HTN (hypertension)    Hypogonadism in male    Lipoma of skin    Memory loss    Mixed Alzheimer's and vascular dementia (HCC)    Poor balance    Restless legs syndrome (RLS)    S/P placement of cardiac pacemaker 04/24/2023   Medtronic single lead PPM    Sleep apnea    cpap   Stroke (HCC) 1991   Tremor     Tobacco History: Social History   Tobacco Use  Smoking Status Never  Smokeless Tobacco Former   Quit date: 07/23/1968   Counseling given: Not Answered   Continue to not smoke  Outpatient Encounter Medications as of 09/22/2023  Medication Sig   acetaminophen  (TYLENOL ) 325 MG tablet Take 650 mg by mouth in the morning and at bedtime.   acetaminophen  (TYLENOL ) 500 MG tablet Take 2 tablets (1,000 mg total) by mouth every 6 (six) hours as needed.    atorvastatin  (LIPITOR) 40 MG tablet Take 1 tablet (40 mg total) by mouth every evening. Please call our office to schedule an yearly appointment with Dr. Court  for November 2024 before anymore refills. [phone]   Azelaic Acid 15 % gel Apply 1 Application topically 2 (two) times daily. Nose & facial creases.   b complex vitamins capsule Take 1 capsule by mouth daily.   carbidopa -levodopa  (SINEMET  IR) 25-250 MG tablet Take 1 tablet by mouth 2 (two) times daily.   divalproex  (DEPAKOTE ) 125 MG DR tablet Take 250-500 mg by mouth See admin instructions. Take 250mg  (2 tablets) by mouth every morning and 500mg  (4 tablets) every evening.   donepezil  (ARICEPT ) 10 MG tablet Take 10 mg by mouth at bedtime.   folic acid  (FOLVITE ) 1 MG tablet Take 1 mg by mouth at bedtime.   furosemide  (LASIX ) 20 MG tablet Take 20 mg by mouth daily.   hydrOXYzine  (ATARAX ) 25 MG tablet Take 25 mg by mouth every 6 (six) hours as needed for anxiety or itching. X14 days.   lamoTRIgine  (LAMICTAL ) 150 MG tablet Take 150 mg by mouth 2 (two) times daily.   lisinopril  (ZESTRIL ) 10 MG tablet Take 1 tablet (10 mg total) by mouth every evening. Please call our office to schedule an yearly appointment with Dr. Court for November 2024 before anymore refills. [phone]. Thank you 1st attempt   melatonin 3 MG TABS tablet Take 3 mg by mouth at bedtime.   memantine  (NAMENDA ) 10 MG tablet Take 10 mg by mouth 2 (two) times daily.   methocarbamol  (ROBAXIN ) 500 MG tablet Take 1 tablet (500 mg total) by mouth every 8 (eight) hours as needed for muscle spasms.   mirtazapine  (REMERON ) 15 MG tablet Take 15 mg by mouth at bedtime.   REPATHA SURECLICK 140 MG/ML SOAJ Inject 140 mg into the skin every 14 (fourteen) days.   No facility-administered encounter medications on file as of 09/22/2023.     Review of Systems  Review of Systems  Unable to obtain due to cognitive impairment Physical Exam  BP (!) 175/71 (BP Location: Left Arm, Patient  Position: Sitting, Cuff Size: Normal)   Pulse 63   Ht 5' 11 (1.803 m)   Wt 170 lb (77.1 kg)   SpO2 97%   BMI 23.71 kg/m   Wt Readings from Last 5 Encounters:  09/22/23 170 lb (77.1 kg)  08/05/23 173 lb (78.5 kg)  04/24/23 173 lb (78.5 kg)  01/13/23 154 lb 15.7 oz (70.3 kg)  12/18/22 172 lb 9.9 oz (78.3 kg)    BMI Readings from Last 5 Encounters:  09/22/23 23.71 kg/m  08/05/23 24.13 kg/m  04/24/23 24.13 kg/m  01/13/23 21.62 kg/m  12/18/22 24.77 kg/m     Physical Exam General: Sitting in chair in no acute distress Eyes: EOMI no icterus Neck: Supple no JVP Pulmonary: Clear, normal provide Cardiovascular regular rate rhythm no murmur MSK: There is a vascular atrophy Abdomen: Nondistended Neuro: Ambulates with assistance of walker, shuffling gait Psych: Alert, pleasant, cooperative, disoriented   Assessment & Plan:   2 Lung nodules: Left lower lobe 01/2023.  Groundglass. Largest 1.6 cm.  Was never followed up on.  Repeat CT scan x 3 08/2023 at [facility] does not comment on any left lower lobe nodules.  Presumably these have resolved versus not seen in areas of atelectasis.  Repeat CT scan a scan as below.  Hilar/mediastinal lymphadenopathy/mass: Up to 4 cm 08/2023.  Actual location not described on CT scan reports.  I cannot view images.  Presumably reactive to left upper lobe opacity, trauma.  Malignancy possible as well.  Repeat scans as below.  Left upper lobe opacity:  Seen x 3 on CT scans at outside facility 08/2023.  After fall.  I do wonder about pulmonary contusion.  Not treated with antibiotics.  Repeat CT scan at 72-month interval recommend radiology, will obtain this 11/2023.  Overall, patient has very advanced dementia in the memory care unit.  On exam he is calm agreeable and pleasant.  However he clearly has very symptomatic and advanced dementia as is evident as you try to have a conversation.  I discussed with wife in detail that lung cancer is possible.   Discussed also that he is most likely a poor candidate for any treatment.  Would be reasonable to not follow these lung nodules or left upper lobe opacity and let nature take its course.  As treatment may not be an option and if it was an option it very may likely worsen his quality life and extend his life with the current quality of life that is not necessarily desired.  I encouraged her to discuss with family about next steps and and if they felt it was best to cancel repeat images that would be a reasonable choice given his significant cognitive impairment.   No follow-ups on file.   Donnice JONELLE Beals, MD 09/22/2023   This appointment required 43 minutes of patient care (this includes precharting, chart review, review of results, face-to-face care, etc.).

## 2023-09-28 ENCOUNTER — Telehealth: Payer: Self-pay

## 2023-09-28 DIAGNOSIS — R918 Other nonspecific abnormal finding of lung field: Secondary | ICD-10-CM

## 2023-09-28 NOTE — Telephone Encounter (Signed)
CT ordered. thanks

## 2023-09-28 NOTE — Telephone Encounter (Signed)
 Called and spoke with [facility] per DPR. Advised CT has been placed per patient request.  Nfn

## 2023-09-28 NOTE — Telephone Encounter (Signed)
 Copied from CRM [id number]. Topic: Clinical - Request for Lab/Test Order >> Sep 25, 2023  4:29 PM [name] [name] wrote: Reason for CRM: Pt's spouse [name] [name] is calling for her husband to have the CT scan completed in September. She stated Dr. Annella suggested to have one completed in September, but they wanted to talk about it first. They came to the conclusion it is best for them to have one completed. There is no CT order in the pt's chart, please begin order and call the pt for scheduling at [phone] ok to leave a vm.  Per lov note Left upper lobe opacity: Seen x 3 on CT scans at outside facility 08/2023. After fall. I do wonder about pulmonary contusion. Not treated with antibiotics. Repeat CT scan at 56-month interval recommend radiology, will obtain this 11/2023.  Dr. Annella please advise if CT W or W/O contrast

## 2023-09-29 ENCOUNTER — Telehealth: Payer: Self-pay | Admitting: *Deleted

## 2023-09-29 NOTE — Telephone Encounter (Signed)
 Called wife to discuss referral and have left 3 voicemails for return call. Per Dr Annella visit it appears that patient will have CT surveillance done in September to follow. Left voicemail again today regarding closing of referral to Medical Oncology.

## 2023-10-01 DIAGNOSIS — G309 Alzheimer's disease, unspecified: Secondary | ICD-10-CM | POA: Diagnosis not present

## 2023-10-01 DIAGNOSIS — F0284 Dementia in other diseases classified elsewhere, unspecified severity, with anxiety: Secondary | ICD-10-CM | POA: Diagnosis not present

## 2023-10-01 DIAGNOSIS — R296 Repeated falls: Secondary | ICD-10-CM | POA: Diagnosis not present

## 2023-10-01 DIAGNOSIS — I10 Essential (primary) hypertension: Secondary | ICD-10-CM | POA: Diagnosis not present

## 2023-10-01 DIAGNOSIS — F0154 Vascular dementia, unspecified severity, with anxiety: Secondary | ICD-10-CM | POA: Diagnosis not present

## 2023-10-01 DIAGNOSIS — I442 Atrioventricular block, complete: Secondary | ICD-10-CM | POA: Diagnosis not present

## 2023-10-01 DIAGNOSIS — F0283 Dementia in other diseases classified elsewhere, unspecified severity, with mood disturbance: Secondary | ICD-10-CM | POA: Diagnosis not present

## 2023-10-01 DIAGNOSIS — E785 Hyperlipidemia, unspecified: Secondary | ICD-10-CM | POA: Diagnosis not present

## 2023-10-01 DIAGNOSIS — F01518 Vascular dementia, unspecified severity, with other behavioral disturbance: Secondary | ICD-10-CM | POA: Diagnosis not present

## 2023-10-01 DIAGNOSIS — F0390 Unspecified dementia without behavioral disturbance: Secondary | ICD-10-CM | POA: Diagnosis not present

## 2023-10-01 DIAGNOSIS — F319 Bipolar disorder, unspecified: Secondary | ICD-10-CM | POA: Diagnosis not present

## 2023-10-01 DIAGNOSIS — I4821 Permanent atrial fibrillation: Secondary | ICD-10-CM | POA: Diagnosis not present

## 2023-10-01 DIAGNOSIS — F0153 Vascular dementia, unspecified severity, with mood disturbance: Secondary | ICD-10-CM | POA: Diagnosis not present

## 2023-10-06 DIAGNOSIS — F411 Generalized anxiety disorder: Secondary | ICD-10-CM | POA: Diagnosis not present

## 2023-10-06 DIAGNOSIS — F5105 Insomnia due to other mental disorder: Secondary | ICD-10-CM | POA: Diagnosis not present

## 2023-10-06 DIAGNOSIS — F3113 Bipolar disorder, current episode manic without psychotic features, severe: Secondary | ICD-10-CM | POA: Diagnosis not present

## 2023-10-06 DIAGNOSIS — G301 Alzheimer's disease with late onset: Secondary | ICD-10-CM | POA: Diagnosis not present

## 2023-10-06 DIAGNOSIS — G26 Extrapyramidal and movement disorders in diseases classified elsewhere: Secondary | ICD-10-CM | POA: Diagnosis not present

## 2023-10-06 DIAGNOSIS — F01518 Vascular dementia, unspecified severity, with other behavioral disturbance: Secondary | ICD-10-CM | POA: Diagnosis not present

## 2023-10-07 DIAGNOSIS — D374 Neoplasm of uncertain behavior of colon: Secondary | ICD-10-CM | POA: Diagnosis not present

## 2023-10-07 DIAGNOSIS — F5105 Insomnia due to other mental disorder: Secondary | ICD-10-CM | POA: Diagnosis not present

## 2023-10-07 DIAGNOSIS — F411 Generalized anxiety disorder: Secondary | ICD-10-CM | POA: Diagnosis not present

## 2023-10-07 DIAGNOSIS — D487 Neoplasm of uncertain behavior of other specified sites: Secondary | ICD-10-CM | POA: Diagnosis not present

## 2023-10-07 DIAGNOSIS — F250 Schizoaffective disorder, bipolar type: Secondary | ICD-10-CM | POA: Diagnosis not present

## 2023-10-07 DIAGNOSIS — R54 Age-related physical debility: Secondary | ICD-10-CM | POA: Diagnosis not present

## 2023-10-07 DIAGNOSIS — M6284 Sarcopenia: Secondary | ICD-10-CM | POA: Diagnosis not present

## 2023-10-07 DIAGNOSIS — R296 Repeated falls: Secondary | ICD-10-CM | POA: Diagnosis not present

## 2023-10-07 DIAGNOSIS — G26 Extrapyramidal and movement disorders in diseases classified elsewhere: Secondary | ICD-10-CM | POA: Diagnosis not present

## 2023-10-12 ENCOUNTER — Ambulatory Visit: Payer: Medicare PPO

## 2023-10-15 ENCOUNTER — Ambulatory Visit: Payer: Self-pay | Admitting: Cardiovascular Disease

## 2023-10-15 DIAGNOSIS — F0153 Vascular dementia, unspecified severity, with mood disturbance: Secondary | ICD-10-CM | POA: Diagnosis not present

## 2023-10-15 DIAGNOSIS — I442 Atrioventricular block, complete: Secondary | ICD-10-CM | POA: Diagnosis not present

## 2023-10-15 DIAGNOSIS — I10 Essential (primary) hypertension: Secondary | ICD-10-CM | POA: Diagnosis not present

## 2023-10-15 DIAGNOSIS — G309 Alzheimer's disease, unspecified: Secondary | ICD-10-CM | POA: Diagnosis not present

## 2023-10-15 DIAGNOSIS — F0283 Dementia in other diseases classified elsewhere, unspecified severity, with mood disturbance: Secondary | ICD-10-CM | POA: Diagnosis not present

## 2023-10-15 DIAGNOSIS — F0284 Dementia in other diseases classified elsewhere, unspecified severity, with anxiety: Secondary | ICD-10-CM | POA: Diagnosis not present

## 2023-10-15 DIAGNOSIS — F319 Bipolar disorder, unspecified: Secondary | ICD-10-CM | POA: Diagnosis not present

## 2023-10-15 DIAGNOSIS — F0154 Vascular dementia, unspecified severity, with anxiety: Secondary | ICD-10-CM | POA: Diagnosis not present

## 2023-10-15 DIAGNOSIS — I4821 Permanent atrial fibrillation: Secondary | ICD-10-CM | POA: Diagnosis not present

## 2023-10-18 DIAGNOSIS — I10 Essential (primary) hypertension: Secondary | ICD-10-CM | POA: Diagnosis not present

## 2023-10-18 DIAGNOSIS — F319 Bipolar disorder, unspecified: Secondary | ICD-10-CM | POA: Diagnosis not present

## 2023-10-18 DIAGNOSIS — F0283 Dementia in other diseases classified elsewhere, unspecified severity, with mood disturbance: Secondary | ICD-10-CM | POA: Diagnosis not present

## 2023-10-18 DIAGNOSIS — I442 Atrioventricular block, complete: Secondary | ICD-10-CM | POA: Diagnosis not present

## 2023-10-18 DIAGNOSIS — G309 Alzheimer's disease, unspecified: Secondary | ICD-10-CM | POA: Diagnosis not present

## 2023-10-18 DIAGNOSIS — F0154 Vascular dementia, unspecified severity, with anxiety: Secondary | ICD-10-CM | POA: Diagnosis not present

## 2023-10-18 DIAGNOSIS — F0284 Dementia in other diseases classified elsewhere, unspecified severity, with anxiety: Secondary | ICD-10-CM | POA: Diagnosis not present

## 2023-10-18 DIAGNOSIS — I4821 Permanent atrial fibrillation: Secondary | ICD-10-CM | POA: Diagnosis not present

## 2023-10-18 DIAGNOSIS — F0153 Vascular dementia, unspecified severity, with mood disturbance: Secondary | ICD-10-CM | POA: Diagnosis not present

## 2023-10-19 DIAGNOSIS — L821 Other seborrheic keratosis: Secondary | ICD-10-CM | POA: Diagnosis not present

## 2023-10-19 DIAGNOSIS — L720 Epidermal cyst: Secondary | ICD-10-CM | POA: Diagnosis not present

## 2023-10-19 DIAGNOSIS — D171 Benign lipomatous neoplasm of skin and subcutaneous tissue of trunk: Secondary | ICD-10-CM | POA: Diagnosis not present

## 2023-10-19 DIAGNOSIS — L218 Other seborrheic dermatitis: Secondary | ICD-10-CM | POA: Diagnosis not present

## 2023-10-19 DIAGNOSIS — L853 Xerosis cutis: Secondary | ICD-10-CM | POA: Diagnosis not present

## 2023-10-19 DIAGNOSIS — L570 Actinic keratosis: Secondary | ICD-10-CM | POA: Diagnosis not present

## 2023-10-19 DIAGNOSIS — D225 Melanocytic nevi of trunk: Secondary | ICD-10-CM | POA: Diagnosis not present

## 2023-10-19 DIAGNOSIS — L814 Other melanin hyperpigmentation: Secondary | ICD-10-CM | POA: Diagnosis not present

## 2023-10-24 DIAGNOSIS — D649 Anemia, unspecified: Secondary | ICD-10-CM | POA: Diagnosis not present

## 2023-10-24 DIAGNOSIS — R531 Weakness: Secondary | ICD-10-CM | POA: Diagnosis not present

## 2023-10-24 DIAGNOSIS — R9431 Abnormal electrocardiogram [ECG] [EKG]: Secondary | ICD-10-CM | POA: Diagnosis not present

## 2023-10-24 DIAGNOSIS — R918 Other nonspecific abnormal finding of lung field: Secondary | ICD-10-CM | POA: Diagnosis not present

## 2023-10-24 DIAGNOSIS — S80212A Abrasion, left knee, initial encounter: Secondary | ICD-10-CM | POA: Diagnosis not present

## 2023-10-24 DIAGNOSIS — R789 Finding of unspecified substance, not normally found in blood: Secondary | ICD-10-CM | POA: Diagnosis not present

## 2023-10-24 DIAGNOSIS — W19XXXA Unspecified fall, initial encounter: Secondary | ICD-10-CM | POA: Diagnosis not present

## 2023-10-24 DIAGNOSIS — Z950 Presence of cardiac pacemaker: Secondary | ICD-10-CM | POA: Diagnosis not present

## 2023-10-24 DIAGNOSIS — J90 Pleural effusion, not elsewhere classified: Secondary | ICD-10-CM | POA: Diagnosis not present

## 2023-10-24 DIAGNOSIS — I4891 Unspecified atrial fibrillation: Secondary | ICD-10-CM | POA: Diagnosis not present

## 2023-10-24 DIAGNOSIS — I469 Cardiac arrest, cause unspecified: Secondary | ICD-10-CM | POA: Diagnosis not present

## 2023-10-24 DIAGNOSIS — F319 Bipolar disorder, unspecified: Secondary | ICD-10-CM | POA: Diagnosis not present

## 2023-10-24 DIAGNOSIS — I517 Cardiomegaly: Secondary | ICD-10-CM | POA: Diagnosis not present

## 2023-10-24 DIAGNOSIS — S0990XA Unspecified injury of head, initial encounter: Secondary | ICD-10-CM | POA: Diagnosis not present

## 2023-10-24 DIAGNOSIS — R4182 Altered mental status, unspecified: Secondary | ICD-10-CM | POA: Diagnosis not present

## 2023-10-24 DIAGNOSIS — R456 Violent behavior: Secondary | ICD-10-CM | POA: Diagnosis not present

## 2023-10-24 DIAGNOSIS — F0390 Unspecified dementia without behavioral disturbance: Secondary | ICD-10-CM | POA: Diagnosis not present

## 2023-10-24 NOTE — ED Provider Notes (Signed)
 [facility]  ED Provider Note  [name] [name] 78 y.o. male DOB: 02-Jan-1946 MRN: [id number] History   Chief Complaint  Patient presents with  . Altered Mental Status    Original call from [facility] was for aggressive behavior, the latest call to EMS was for unresponsiveness.  EMS got blood glucose of 138, and put 18g IV in L AC.  Per staff pt was aggressive was given a Xanax, and then fell back but they caught him.  Pt has 2mm pupils, minimal response with EMS.  Pt will speak with staff now and reports I want to play basket ball, because I'm really good at it.  Pt appears altered but has dementia hx.     [name] [name] presents to the emergency department by EMS.  He is a resident of [facility].  Facility reports that he was displaying difficult behavior this morning, and they gave him Xanax.  He had a witnessed fall followed by a period of loss of consciousness.  EMS reports that he was unresponsive, but as soon as he got over onto the stretcher in the emergency department he became alert.  He is currently not endorsing any pain, but symptoms are limited by significant dementia.  Chart review reveals that he eventually has metastatic cancer, but due to his dementia, no further diagnosis or treatment are being pursued   History provided by:  EMS personnel History limited by:  Dementia Altered Mental Status      Past Medical History:  Diagnosis Date  . Anemia   . Bipolar disorder (*)   . Cardiomegaly   . Dementia (*)   . Dysphagia   . Thrombocytopenia   . UTI (urinary tract infection)     History reviewed. No pertinent surgical history.  Social History   Substance and Sexual Activity  Alcohol  Use Not Currently   Tobacco Use History[1] E-Cigarettes  . Vaping Use Never Assessed   . Start Date    . Cartridges/Day    . Quit Date     Social History   Substance and Sexual Activity  Drug Use Not Currently          Allergies[2]  Home Medications   ACETAMINOPHEN  (TYLENOL ) 325 MG TABLET    Take two tablets (650 mg dose) by mouth 2 (two) times daily.   ACETAMINOPHEN  (TYLENOL ) 325 MG TABLET    Take two tablets (650 mg dose) by mouth 2 (two) times daily.   ALPRAZOLAM (XANAX) 1 MG TABLET    Take one tablet (1 mg dose) by mouth 2 (two) times a day as needed (for anxity or agitation).   ATORVASTATIN  (LIPITOR) 40 MG TABLET    Take one tablet (40 mg dose) by mouth daily.   ATORVASTATIN  (LIPITOR) 40 MG TABLET    Take one tablet (40 mg dose) by mouth every morning.   AZELAIC ACID 15 % GEL    Apply one application. topically 2 (two) times daily. Apply to nose and facial creases   CARBIDOPA -LEVODOPA  (SINEMET ) 25-250 MG PER TABLET    Take one tablet by mouth 2 (two) times daily.   DIVALPROEX  SODIUM (DEPAKOTE  DR) 125 MG EC TABLET    Take four tablets (500 mg dose) by mouth at bedtime.   DIVALPROEX  SODIUM (DEPAKOTE  DR) 250 MG EC TABLET    Take one tablet (250 mg dose) by mouth 3 (three) times a day. For mood stabilization, anxiety and agitation.   DIVALPROEX  SODIUM (DEPAKOTE  SPRINKLE) 125 MG  CAPSULE    Take two hundred fifty mg by mouth every morning.   DONEPEZIL  HCL (ARICEPT ) 10 MG TABLET    Take one tablet (10 mg dose) by mouth daily.   DONEPEZIL  HCL (ARICEPT ) 10 MG TABLET    Take one tablet (10 mg dose) by mouth at bedtime.   FOLIC ACID  1 MG TABLET    Take one tablet (1 mg dose) by mouth daily.   FOLIC ACID  1 MG TABLET    Take one tablet (1 mg dose) by mouth every morning.   FUROSEMIDE  (LASIX ) 20 MG TABLET    Take one tablet (20 mg dose) by mouth daily.   HYDROXYZINE  HCL (ATARAX ) 25 MG TABLET    Take one tablet (25 mg dose) by mouth every 6 (six) hours as needed.   LAMOTRIGINE  (LAMICTAL ) 150 MG TABLET    Take one tablet (150 mg dose) by mouth 2 (two) times daily.   LAMOTRIGINE  (LAMICTAL ) 150 MG TABLET    Take one tablet (150 mg dose) by mouth 2 (two) times daily.   LISINOPRIL  (PRINIVIL ,ZESTRIL ) 10 MG TABLET    Take  one tablet (10 mg dose) by mouth daily.   LISINOPRIL  (PRINIVIL ,ZESTRIL ) 20 MG TABLET    Take one tablet (20 mg dose) by mouth daily.   MELATONIN 3 MG TABS    Take one tablet (3 mg dose) by mouth at bedtime.   MEMANTINE  HCL (NAMENDA ) 10 MG TABLET    Take one tablet (10 mg dose) by mouth 2 (two) times daily.   MEMANTINE  HCL (NAMENDA ) 10 MG TABLET    Take one tablet (10 mg dose) by mouth 2 (two) times daily.   MIRTAZAPINE  (REMERON ) 15 MG TABLET    Take one tablet (15 mg dose) by mouth at bedtime.   REPATHA SURECLICK 140 MG/ML SOAJ PEN    Inject 1 mL (140 mg dose) into the skin every 14 (fourteen) days.   TORSEMIDE  (DEMADEX ) 10 MG TABLET    Take one tablet (10 mg dose) by mouth daily.   ZIPRASIDONE  (GEODON ) 40 MG CAPSULE    Take one capsule (40 mg dose) by mouth daily at 5 (five) pm. With PM (@1700 ) meal   ZIPRASIDONE  (GEODON ) 60 MG CAPSULE    Take one capsule (60 mg dose) by mouth daily. With AM meal    Primary Survey   Exposure No Thoracic Spine Tenderness and No Lumbar Spinal Tenderness         Review of Systems   Review of Systems  Unable to perform ROS: Dementia    Physical Exam   ED Triage Vitals [10/24/23 1216]  BP (!) 186/86  Heart Rate 60  Resp 20  SpO2 92 %  Temp 97.5 F (36.4 C)    Physical Exam  Nursing note and vitals reviewed. Constitutional: He does not appear distressed and does not appear ill.  Talking continuously about the past in fragmented sentences  HENT:  Head: Normocephalic and atraumatic.  Eyes: EOM are intact. Conjunctivae are normal.  Cardiovascular: Normal rate, regular rhythm and normal heart sounds.  Pulmonary/Chest: Respiratory effort normal and breath sounds normal.  Abdominal: Soft. There is no abdominal tenderness. Abdomen not distended.  Genitourinary:  Penis: Penis normal.    Genitourinary Comments: No perineal breakdown   Musculoskeletal: No T spine tenderness and no L spine tenderness. No obvious deformity noted to extremities.      Comments: Small abrasion left anterior knee   Neurological: He is alert. Moves all extremities equally. He has  normal speech.  Skin: Skin is warm. Skin is dry.     ED Course   Lab results:   VALPROIC ACID  LEVEL, TOTAL - Abnormal      Result Value   Valp Acid 48.5 (*)   CBC AND DIFFERENTIAL - Abnormal   WBC 4.6     RBC 3.68 (*)    HGB 11.0 (*)    HCT 34.9 (*)    MCV 94.8 (*)    MCH 29.9     MCHC 31.5 (*)    Plt Ct 114 (*)    RDW SD 52.0 (*)    MPV 10.9     NRBC% 0.0     Absolute NRBC Count 0.00     NEUTROPHIL % 69.0     LYMPHOCYTE % 18.9     MONOCYTE % 9.1     Eosinophil % 2.4     BASOPHIL % 0.4     IG% 0.2     ABSOLUTE NEUTROPHIL COUNT 3.18     ABSOLUTE LYMPHOCYTE COUNT 0.87 (*)    Absolute Monocyte Count 0.42     Absolute Eosinophil Count 0.11     Absolute Basophil Count 0.02     Absolute Immature Granulocyte Count 0.01    COMPREHENSIVE METABOLIC PANEL - Abnormal   Na 140     Potassium 4.3     Cl 104     CO2 30     AGAP 6 (*)    Glucose 91     BUN 25     Creatinine 1.27     Ca 10.4 (*)    ALK PHOS 210 (*)    T Bili 0.5     Total Protein 7.4     Alb 4.0     GLOBULIN 3.4     ALBUMIN/GLOBULIN RATIO 1.2     BUN/CREAT RATIO 19.7     ALT 5     AST 15     eGFR 57 (*)    Comment: Normal GFR (glomerular filtration rate) > 60 mL/min/1.73 meters squared, < 60 may include impaired kidney function. Calculation based on the Chronic Kidney Disease Epidemiology Collaboration (CK-EPI)equation refit without adjustment for race.    Imaging:   CT HEAD WO CONTRAST   Narrative:    HISTORY: Trauma Head  COMPARISON: 06/04/2023  TECHNIQUE: Multiple contiguous axial images of the head are obtained.  CONTRAST: None  FINDINGS:  Brain parenchyma: There is no mass effect or midline shift.  There is no acute intracranial hemorrhage.    There are changes of chronic small vessel ischemic disease. There is an old right frontal lobe infarct.  Ventricles and extra-axial  spaces: Within normal limits.  Paranasal sinuses and mastoids: Clear as visualized.  Calvarium and skull base: Intact    Impression:    IMPRESSION: No acute intracranial findings.  Electronically Signed by: Lynwood Portugal on 10/24/2023 1:26 PM  XR CHEST AP PORTABLE   Narrative:    History:  Weakness  Comparison:  06/04/2023  Technique: Portable chest  Interpretation: Left basilar opacity may reflect a combination of pleural fluid and atelectasis or airspace disease. The right lung is grossly clear. There is cardiomegaly, a single lead left subclavian approach pacemaker, and a loop recorder.    Impression:    Impression: Cardiomegaly with left pleural effusion and left basilar atelectasis/airspace disease  Electronically Signed by: Lynwood Portugal on 10/24/2023 1:20 PM     ECG: ECG Results          ECG 12  lead (Final result)  Result time 10/24/23 13:12:50    Final result             Narrative:   Diagnosis Class Abnormal Acquisition Device D3K Ventricular Rate 68 Atrial Rate 62 QRS Duration 108 Q-T Interval 430 QTC Calculation(Bazett) 457 Calculated R Axis 72 Calculated T Axis -41  Diagnosis Atrial fibrillation Nonspecific T wave abnormality Abnormal ECG No previous ECGs available Normal axis Brien Kung ([id number]) on 10/24/2023 1:12:40 PM certifies that he/she has reviewed the ECG tracing and confirms the independent  interpretation is correct.                                        NIH Stroke Scale Score Level of Consciousness (1a.): Alert, keenly responsive LOC Questions (1b.): Answers both questions correctly (Confusion hx of dementia) LOC Commands (1c.): Performs both tasks correctly Best Gaze (2.): Normal Visual (3.): No visual loss Facial Palsy (4.): Normal symmetrical movements Motor Arm, Left (5a.): No drift Motor Arm, Right (5b.): No drift Motor Leg, Left (6a.): No drift Motor Leg, Right (6b.): No drift Limb Ataxia (7.):  Absent Sensory (8.): Normal, no sensory loss Best Language (9.): No aphasia Dysarthria (10.): Normal Extinction and Inattention (11.) (Formerly Neglect): No abnormality NIH Total: 0                                               Pre-Sedation Procedures  ED Course as of 10/24/23 1553  Kung MATSU Wright's Documentation  Dju Oct 24, 2023  1538 Review of pacemaker data reveals that is functioning normally  1549 HGB 10.9 08/22/23   Medical Decision Making [name] D Longwell scented to the ED from his facility after a period of unresponsiveness.  This was associated with the administration of a benzodiazepine and a fall.  Patient's dementia prevented full history taking.  He had no identified trauma on exam aside from mild abrasion to his right knee.  Workup revealed no abnormalities on head CT.  Labs were unremarkable.  Depakote  level was slightly low.  Pacemaker was interrogated and revealed no abnormalities and normal function.  Family was comfortable taking him home.  Facility did call and request a urinalysis.  I spoke with the patient's daughter about the fact that the patient could have asymptomatic bacteriuria, and in the absence of any symptoms of an infection, it was not indicated to test his urine.  He was ultimately discharged in good condition.  Problems Addressed: Altered mental status, unspecified altered mental status type: acute illness or injury with systemic symptoms Anemia, unspecified type: chronic illness or injury Pacemaker: chronic illness or injury Serum medication level outside reference range: acute illness or injury with systemic symptoms  Amount and/or Complexity of Data Reviewed Independent Historian: EMS    Details: Family arrived later in the ED course.  I was able to obtain relevant history from them as well. External Data Reviewed: notes.    Details: Outpatient notes Labs: ordered. Decision-making details documented in ED  Course. Radiology: ordered and independent interpretation performed. Decision-making details documented in ED Course.    Details: Head shows no bleed or midline shift ECG/medicine tests: ordered and independent interpretation performed. Decision-making details documented in ED Course.  Risk Decision regarding hospitalization.  Provider Communication  New Prescriptions   No medications on file    Modified Medications   No medications on file    Discontinued Medications   No medications on file    Clinical Impression Final diagnoses:  Altered mental status, unspecified altered mental status type  Serum medication level outside reference range  Anemia, unspecified type  Pacemaker    ED Disposition     ED Disposition  Discharge   Condition  Stable   Comment  --                   Electronically signed by:       [1] Social History Tobacco Use  Smoking Status Unknown  Smokeless Tobacco Not on file  [2] Allergies Allergen Reactions  . Latex Unknown   Therisa KANDICE Silvan, MD 10/24/23 [phone]

## 2023-10-27 DIAGNOSIS — F3113 Bipolar disorder, current episode manic without psychotic features, severe: Secondary | ICD-10-CM | POA: Diagnosis not present

## 2023-10-27 DIAGNOSIS — F0284 Dementia in other diseases classified elsewhere, unspecified severity, with anxiety: Secondary | ICD-10-CM | POA: Diagnosis not present

## 2023-10-27 DIAGNOSIS — C180 Malignant neoplasm of cecum: Secondary | ICD-10-CM | POA: Diagnosis not present

## 2023-10-28 DIAGNOSIS — F411 Generalized anxiety disorder: Secondary | ICD-10-CM | POA: Diagnosis not present

## 2023-10-28 DIAGNOSIS — R4182 Altered mental status, unspecified: Secondary | ICD-10-CM | POA: Diagnosis not present

## 2023-10-28 DIAGNOSIS — M6281 Muscle weakness (generalized): Secondary | ICD-10-CM | POA: Diagnosis not present

## 2023-10-28 DIAGNOSIS — D649 Anemia, unspecified: Secondary | ICD-10-CM | POA: Diagnosis not present

## 2023-10-28 DIAGNOSIS — F319 Bipolar disorder, unspecified: Secondary | ICD-10-CM | POA: Diagnosis not present

## 2023-10-28 DIAGNOSIS — F0390 Unspecified dementia without behavioral disturbance: Secondary | ICD-10-CM | POA: Diagnosis not present

## 2023-10-29 DIAGNOSIS — I10 Essential (primary) hypertension: Secondary | ICD-10-CM | POA: Diagnosis not present

## 2023-10-29 DIAGNOSIS — F0154 Vascular dementia, unspecified severity, with anxiety: Secondary | ICD-10-CM | POA: Diagnosis not present

## 2023-10-29 DIAGNOSIS — F0153 Vascular dementia, unspecified severity, with mood disturbance: Secondary | ICD-10-CM | POA: Diagnosis not present

## 2023-10-29 DIAGNOSIS — G309 Alzheimer's disease, unspecified: Secondary | ICD-10-CM | POA: Diagnosis not present

## 2023-10-29 DIAGNOSIS — I442 Atrioventricular block, complete: Secondary | ICD-10-CM | POA: Diagnosis not present

## 2023-10-29 DIAGNOSIS — I4821 Permanent atrial fibrillation: Secondary | ICD-10-CM | POA: Diagnosis not present

## 2023-10-29 DIAGNOSIS — F0283 Dementia in other diseases classified elsewhere, unspecified severity, with mood disturbance: Secondary | ICD-10-CM | POA: Diagnosis not present

## 2023-10-29 DIAGNOSIS — F0284 Dementia in other diseases classified elsewhere, unspecified severity, with anxiety: Secondary | ICD-10-CM | POA: Diagnosis not present

## 2023-10-29 DIAGNOSIS — F319 Bipolar disorder, unspecified: Secondary | ICD-10-CM | POA: Diagnosis not present

## 2023-10-31 DIAGNOSIS — I10 Essential (primary) hypertension: Secondary | ICD-10-CM | POA: Diagnosis not present

## 2023-10-31 DIAGNOSIS — D649 Anemia, unspecified: Secondary | ICD-10-CM | POA: Diagnosis not present

## 2023-10-31 DIAGNOSIS — F319 Bipolar disorder, unspecified: Secondary | ICD-10-CM | POA: Diagnosis not present

## 2023-10-31 DIAGNOSIS — F0390 Unspecified dementia without behavioral disturbance: Secondary | ICD-10-CM | POA: Diagnosis not present

## 2023-11-03 DIAGNOSIS — F5105 Insomnia due to other mental disorder: Secondary | ICD-10-CM | POA: Diagnosis not present

## 2023-11-03 DIAGNOSIS — F411 Generalized anxiety disorder: Secondary | ICD-10-CM | POA: Diagnosis not present

## 2023-11-03 DIAGNOSIS — F01518 Vascular dementia, unspecified severity, with other behavioral disturbance: Secondary | ICD-10-CM | POA: Diagnosis not present

## 2023-11-03 DIAGNOSIS — F319 Bipolar disorder, unspecified: Secondary | ICD-10-CM | POA: Diagnosis not present

## 2023-11-03 DIAGNOSIS — G301 Alzheimer's disease with late onset: Secondary | ICD-10-CM | POA: Diagnosis not present

## 2023-11-03 DIAGNOSIS — G26 Extrapyramidal and movement disorders in diseases classified elsewhere: Secondary | ICD-10-CM | POA: Diagnosis not present

## 2023-11-04 DIAGNOSIS — F411 Generalized anxiety disorder: Secondary | ICD-10-CM | POA: Diagnosis not present

## 2023-11-04 DIAGNOSIS — F314 Bipolar disorder, current episode depressed, severe, without psychotic features: Secondary | ICD-10-CM | POA: Diagnosis not present

## 2023-11-04 DIAGNOSIS — M6281 Muscle weakness (generalized): Secondary | ICD-10-CM | POA: Diagnosis not present

## 2023-11-05 ENCOUNTER — Emergency Department (HOSPITAL_COMMUNITY)

## 2023-11-05 ENCOUNTER — Other Ambulatory Visit: Payer: Self-pay

## 2023-11-05 ENCOUNTER — Encounter (HOSPITAL_COMMUNITY): Payer: Self-pay

## 2023-11-05 ENCOUNTER — Emergency Department (HOSPITAL_COMMUNITY): Admission: EM | Admit: 2023-11-05 | Discharge: 2023-11-10 | Disposition: A | Discharge status: Home / Self Care

## 2023-11-05 DIAGNOSIS — R2689 Other abnormalities of gait and mobility: Secondary | ICD-10-CM | POA: Insufficient documentation

## 2023-11-05 DIAGNOSIS — F0390 Unspecified dementia without behavioral disturbance: Secondary | ICD-10-CM | POA: Insufficient documentation

## 2023-11-05 DIAGNOSIS — R4182 Altered mental status, unspecified: Secondary | ICD-10-CM | POA: Diagnosis present

## 2023-11-05 DIAGNOSIS — R531 Weakness: Secondary | ICD-10-CM | POA: Diagnosis not present

## 2023-11-05 DIAGNOSIS — M6281 Muscle weakness (generalized): Secondary | ICD-10-CM | POA: Insufficient documentation

## 2023-11-05 DIAGNOSIS — R410 Disorientation, unspecified: Secondary | ICD-10-CM | POA: Diagnosis not present

## 2023-11-05 DIAGNOSIS — Z91040 Latex allergy status: Secondary | ICD-10-CM | POA: Insufficient documentation

## 2023-11-05 DIAGNOSIS — R059 Cough, unspecified: Secondary | ICD-10-CM | POA: Diagnosis not present

## 2023-11-05 DIAGNOSIS — I517 Cardiomegaly: Secondary | ICD-10-CM | POA: Diagnosis not present

## 2023-11-05 DIAGNOSIS — Z950 Presence of cardiac pacemaker: Secondary | ICD-10-CM | POA: Diagnosis not present

## 2023-11-05 DIAGNOSIS — Z743 Need for continuous supervision: Secondary | ICD-10-CM | POA: Diagnosis not present

## 2023-11-05 DIAGNOSIS — R918 Other nonspecific abnormal finding of lung field: Secondary | ICD-10-CM | POA: Diagnosis not present

## 2023-11-05 LAB — CBC WITH DIFFERENTIAL/PLATELET
Abs Immature Granulocytes: 0.02 K/uL (ref 0.00–0.07)
Basophils Absolute: 0.1 K/uL (ref 0.0–0.1)
Basophils Relative: 1 %
Eosinophils Absolute: 0.2 K/uL (ref 0.0–0.5)
Eosinophils Relative: 3 %
HCT: 37.9 % — ABNORMAL LOW (ref 39.0–52.0)
Hemoglobin: 11.6 g/dL — ABNORMAL LOW (ref 13.0–17.0)
Immature Granulocytes: 0 %
Lymphocytes Relative: 25 %
Lymphs Abs: 1.4 K/uL (ref 0.7–4.0)
MCH: 29.4 pg (ref 26.0–34.0)
MCHC: 30.6 g/dL (ref 30.0–36.0)
MCV: 96.2 fL (ref 80.0–100.0)
Monocytes Absolute: 0.4 K/uL (ref 0.1–1.0)
Monocytes Relative: 8 %
Neutro Abs: 3.5 K/uL (ref 1.7–7.7)
Neutrophils Relative %: 63 %
Platelets: 119 K/uL — ABNORMAL LOW (ref 150–400)
RBC: 3.94 MIL/uL — ABNORMAL LOW (ref 4.22–5.81)
RDW: 15.2 % (ref 11.5–15.5)
WBC: 5.6 K/uL (ref 4.0–10.5)
nRBC: 0 % (ref 0.0–0.2)

## 2023-11-05 LAB — COMPREHENSIVE METABOLIC PANEL WITH GFR
ALT: 7 U/L (ref 0–44)
AST: 18 U/L (ref 15–41)
Albumin: 3.7 g/dL (ref 3.5–5.0)
Alkaline Phosphatase: 150 U/L — ABNORMAL HIGH (ref 38–126)
Anion gap: 14 (ref 5–15)
BUN: 27 mg/dL — ABNORMAL HIGH (ref 8–23)
CO2: 24 mmol/L (ref 22–32)
Calcium: 9.8 mg/dL (ref 8.9–10.3)
Chloride: 107 mmol/L (ref 98–111)
Creatinine, Ser: 1.34 mg/dL — ABNORMAL HIGH (ref 0.61–1.24)
GFR, Estimated: 54 mL/min — ABNORMAL LOW (ref >60)
Glucose, Bld: 103 mg/dL — ABNORMAL HIGH (ref 70–99)
Potassium: 4.4 mmol/L (ref 3.5–5.1)
Sodium: 145 mmol/L (ref 135–145)
Total Bilirubin: 0.7 mg/dL (ref 0.0–1.2)
Total Protein: 7.3 g/dL (ref 6.5–8.1)

## 2023-11-05 LAB — LIPASE, BLOOD: Lipase: 62 U/L — ABNORMAL HIGH (ref 11–51)

## 2023-11-05 MED ORDER — OLANZAPINE 5 MG PO TBDP
5.0000 mg | ORAL_TABLET | Freq: Once | ORAL | Status: AC
  Administered 2023-11-05: 5 mg via ORAL
  Filled 2023-11-05: qty 1

## 2023-11-05 MED ORDER — ZIPRASIDONE HCL 20 MG PO CAPS
40.0000 mg | ORAL_CAPSULE | Freq: Every evening | ORAL | Status: DC
Start: 2023-11-05 — End: 2023-11-10
  Administered 2023-11-05 – 2023-11-09 (×5): 40 mg via ORAL
  Filled 2023-11-05 (×2): qty 1
  Filled 2023-11-05 (×2): qty 2
  Filled 2023-11-05 (×2): qty 1

## 2023-11-05 MED ORDER — TORSEMIDE 20 MG PO TABS
10.0000 mg | ORAL_TABLET | Freq: Every day | ORAL | Status: DC
  Administered 2023-11-06 – 2023-11-10 (×5): 10 mg via ORAL
  Filled 2023-11-05 (×5): qty 1

## 2023-11-05 MED ORDER — LISINOPRIL 20 MG PO TABS
20.0000 mg | ORAL_TABLET | Freq: Every evening | ORAL | Status: DC
  Administered 2023-11-05 – 2023-11-09 (×5): 20 mg via ORAL
  Filled 2023-11-05 (×5): qty 1

## 2023-11-05 MED ORDER — MIRTAZAPINE 15 MG PO TABS
15.0000 mg | ORAL_TABLET | Freq: Every day | ORAL | Status: DC
  Administered 2023-11-05 – 2023-11-09 (×5): 15 mg via ORAL
  Filled 2023-11-05 (×5): qty 1

## 2023-11-05 MED ORDER — ATORVASTATIN CALCIUM 40 MG PO TABS
40.0000 mg | ORAL_TABLET | Freq: Every evening | ORAL | Status: DC
  Administered 2023-11-06 – 2023-11-09 (×5): 40 mg via ORAL
  Filled 2023-11-05 (×5): qty 1

## 2023-11-05 MED ORDER — MEMANTINE HCL 10 MG PO TABS
10.0000 mg | ORAL_TABLET | Freq: Two times a day (BID) | ORAL | Status: DC
  Administered 2023-11-05 – 2023-11-10 (×10): 10 mg via ORAL
  Filled 2023-11-05 (×10): qty 1

## 2023-11-05 MED ORDER — DIVALPROEX SODIUM 250 MG PO DR TAB
250.0000 mg | DELAYED_RELEASE_TABLET | Freq: Three times a day (TID) | ORAL | Status: DC
  Administered 2023-11-05 – 2023-11-09 (×11): 250 mg via ORAL
  Filled 2023-11-05 (×11): qty 1

## 2023-11-05 MED ORDER — FOSFOMYCIN TROMETHAMINE 3 G PO PACK
3.0000 g | PACK | Freq: Once | ORAL | Status: AC
  Administered 2023-11-05: 3 g via ORAL
  Filled 2023-11-05: qty 3

## 2023-11-05 MED ORDER — DONEPEZIL HCL 5 MG PO TABS
10.0000 mg | ORAL_TABLET | Freq: Every day | ORAL | Status: DC
  Administered 2023-11-05 – 2023-11-09 (×5): 10 mg via ORAL
  Filled 2023-11-05: qty 2
  Filled 2023-11-05 (×3): qty 1
  Filled 2023-11-05: qty 2

## 2023-11-05 MED ORDER — ACETAMINOPHEN 325 MG PO TABS: 650.0000 mg | ORAL_TABLET | Freq: Two times a day (BID) | ORAL | Status: DC | PRN

## 2023-11-05 MED ORDER — LAMOTRIGINE 150 MG PO TABS
150.0000 mg | ORAL_TABLET | Freq: Two times a day (BID) | ORAL | Status: DC
  Administered 2023-11-06 – 2023-11-10 (×9): 150 mg via ORAL
  Filled 2023-11-05: qty 6
  Filled 2023-11-05 (×2): qty 1
  Filled 2023-11-05 (×3): qty 6
  Filled 2023-11-05: qty 1
  Filled 2023-11-05: qty 6
  Filled 2023-11-05: qty 1

## 2023-11-05 MED ORDER — ZIPRASIDONE HCL 20 MG PO CAPS
60.0000 mg | ORAL_CAPSULE | Freq: Every day | ORAL | Status: DC
  Administered 2023-11-06 – 2023-11-10 (×4): 60 mg via ORAL
  Filled 2023-11-05 (×3): qty 1
  Filled 2023-11-05: qty 3
  Filled 2023-11-05: qty 1
  Filled 2023-11-05: qty 3

## 2023-11-05 MED ORDER — CARBIDOPA-LEVODOPA 25-250 MG PO TABS
1.0000 | ORAL_TABLET | Freq: Two times a day (BID) | ORAL | Status: DC
  Administered 2023-11-05 – 2023-11-09 (×8): 1 via ORAL
  Filled 2023-11-05 (×15): qty 1

## 2023-11-05 MED ORDER — AMOXICILLIN-POT CLAVULANATE 400-57 MG/5ML PO SUSR: 800.0000 mg | Freq: Two times a day (BID) | ORAL | 0 refills | Status: AC

## 2023-11-05 NOTE — ED Notes (Signed)
 Pt began to take antibiotics with apple sauce, but then refused to take any more. Pt did take Zyprexa  earlier and has been sleeping. Stated that he did not want to take anymore.

## 2023-11-05 NOTE — ED Notes (Signed)
 Nurse called [facility] to make them aware that this patient will be discharged to return back to the facility. Nurse from Memory care stated that pt received discharge paperwork from their facility and that this patient cannot be accepted back there. Pt reportedly needs a skills care facility.

## 2023-11-05 NOTE — ED Notes (Signed)
 Pt given PO medication with applesauce. Pt swallowed without problems

## 2023-11-05 NOTE — ED Notes (Signed)
 [facility] called. Stated that he was discharged from the physician.

## 2023-11-05 NOTE — ED Notes (Signed)
 Traci Priestly abd Christopher Doucet called regarding discharge of father, no response

## 2023-11-05 NOTE — ED Notes (Signed)
 Daughter, Kindrick Lankford returned my phone call. She stated that she is unable to take her father home due to the inability to properly care for him. Daughter stated that she refuses to get the patient

## 2023-11-05 NOTE — ED Notes (Signed)
 Pt's wife requesting to speak to social worker in regards to getting help with switching his facility. Called placed to Child psychotherapist, they will be in 1700

## 2023-11-05 NOTE — TOC Progression Note (Signed)
 Transition of Care [facility]) - Progression Note    Patient Details  Name: [name] [name] MRN: [id number] Date of Birth: 01-12-1946  Transition of Care [facility]) CM/SW Contact  [name] [name], LCSWA Phone Number: 11/05/2023, 10:44 PM  Clinical Narrative:     CSW received consult for SNF placement. CSW spoke with pt's daughter [name] [name] again by phone, she states she is disappointed that pt no longer will be admitted, states pt's wife lives in ILF and pt is unable to go there due to needing supervision. [name] [name] states pt cannot come to her home as it is not a safe space for him, she does not have extra room, and the stairs in her home make pt a significant fall risk as he wanders throughout the night. Pt's daughter stressed that she is not dumping pt but they have tried everything and don't know what else to do, they are requesting assistance in placement as he needs more care than they can provide. CSW requested MD consult PT/OT to assess pt, Inpatient Care Management will continue to follow.                     Expected Discharge Plan and Services                                               Social Drivers of Health (SDOH) Interventions SDOH Screenings   Food Insecurity: Patient Unable To Answer (04/25/2023)  Housing: Patient Unable To Answer (04/25/2023)  Transportation Needs: Patient Unable To Answer (04/25/2023)  Utilities: Patient Unable To Answer (04/25/2023)  Social Connections: Patient Unable To Answer (04/25/2023)  Tobacco Use: Medium Risk (11/05/2023)    Readmission Risk Interventions     No data to display

## 2023-11-05 NOTE — ED Notes (Signed)
 Per daughter, Derryl Uher. Pt is to take pills with applesauce. Stated that is how pt takes medication

## 2023-11-05 NOTE — ED Triage Notes (Signed)
 Pt arrives to ED via EMS from [facility]. Staff called noting increased confusion after choking on a piece of food. Staff reported the patient was able to clear his own airway and shortly after noted increased confusion. Pt has history dementia.

## 2023-11-05 NOTE — ED Provider Notes (Signed)
 [facility] AT [facility] Provider Note   CSN: [id number] Arrival date & time: 11/05/23  1400     Patient presents with: Altered Mental Status   [name] [name] is a 78 y.o. male patient with history of dementia coming from the memory care unit for altered mental status.  Patient was eating his food and was choking.  Staff stated that he has been a little more altered since then.  Due to patient's acuity unable to obtain history.  Patient does not answer questions appropriately due to dementia.  On his care plan does state that he is supposed to have liquids only with foods.    Altered Mental Status      Prior to Admission medications   Medication Sig Start Date End Date Taking? Authorizing Provider  acetaminophen  (TYLENOL ) 325 MG tablet Take 650 mg by mouth in the morning and at bedtime.    [provider]  acetaminophen  (TYLENOL ) 500 MG tablet Take 2 tablets (1,000 mg total) by mouth every 6 (six) hours as needed. 01/23/23   Vicci Burnard SAUNDERS, PA-C  atorvastatin  (LIPITOR) 40 MG tablet Take 1 tablet (40 mg total) by mouth every evening. Please call our office to schedule an yearly appointment with Dr. Court for November 2024 before anymore refills. [phone] 12/25/22   Court Dorn PARAS, MD  Azelaic Acid 15 % gel Apply 1 Application topically 2 (two) times daily. Nose & facial creases. 11/25/22   [provider]  b complex vitamins capsule Take 1 capsule by mouth daily.    [provider]  carbidopa -levodopa  (SINEMET  IR) 25-250 MG tablet Take 1 tablet by mouth 2 (two) times daily. 11/25/22   [provider]  divalproex  (DEPAKOTE ) 125 MG DR tablet Take 250-500 mg by mouth See admin instructions. Take 250mg  (2 tablets) by mouth every morning and 500mg  (4 tablets) every evening. 11/25/22   [provider]  donepezil  (ARICEPT ) 10 MG tablet Take 10 mg by mouth at bedtime. 11/25/22   [provider]  folic  acid (FOLVITE ) 1 MG tablet Take 1 mg by mouth at bedtime. 11/25/22   [provider]  furosemide  (LASIX ) 20 MG tablet Take 20 mg by mouth daily. 04/20/23   [provider]  hydrOXYzine  (ATARAX ) 25 MG tablet Take 25 mg by mouth every 6 (six) hours as needed for anxiety or itching. X14 days. 01/01/23   [provider]  lamoTRIgine  (LAMICTAL ) 150 MG tablet Take 150 mg by mouth 2 (two) times daily. 11/25/22   [provider]  lisinopril  (ZESTRIL ) 10 MG tablet Take 1 tablet (10 mg total) by mouth every evening. Please call our office to schedule an yearly appointment with Dr. Court for November 2024 before anymore refills. [phone]. Thank you 1st attempt 12/25/22   Court Dorn PARAS, MD  melatonin 3 MG TABS tablet Take 3 mg by mouth at bedtime.    [provider]  memantine  (NAMENDA ) 10 MG tablet Take 10 mg by mouth 2 (two) times daily. 11/25/22   [provider]  methocarbamol  (ROBAXIN ) 500 MG tablet Take 1 tablet (500 mg total) by mouth every 8 (eight) hours as needed for muscle spasms. 01/23/23   Vicci Burnard SAUNDERS, PA-C  mirtazapine  (REMERON ) 15 MG tablet Take 15 mg by mouth at bedtime. 11/25/22   [provider]  REPATHA SURECLICK 140 MG/ML SOAJ Inject 140 mg into the skin every 14 (fourteen) days. 11/25/22   [provider]    Allergies: Latex  Review of Systems  All other systems reviewed and are negative.   Updated Vital Signs BP (!) 164/74 (BP Location: Right Arm)   Pulse (!) 57   Temp 97.8 F (36.6 C) (Oral)   Resp 18   SpO2 100%   Physical Exam Vitals and nursing note reviewed.  Constitutional:      General: He is not in acute distress.    Appearance: Normal appearance.  HENT:     Head: Normocephalic and atraumatic.  Eyes:     General:        Right eye: No discharge.        Left eye: No discharge.  Cardiovascular:     Comments: Regular rate and rhythm.  S1/S2 are distinct without any evidence of murmur,  rubs, or gallops.  Radial pulses are 2+ bilaterally.  Dorsalis pedis pulses are 2+ bilaterally.  No evidence of pedal edema. Pulmonary:     Comments: Clear to auscultation bilaterally.  Normal effort.  No respiratory distress.  No evidence of wheezes, rales, or rhonchi heard throughout. Abdominal:     General: Abdomen is flat. Bowel sounds are normal. There is no distension.     Tenderness: There is no abdominal tenderness. There is no guarding or rebound.  Musculoskeletal:        General: Normal range of motion.     Cervical back: Neck supple.  Skin:    General: Skin is warm and dry.     Findings: No rash.  Neurological:     General: No focal deficit present.     Mental Status: He is alert.  Psychiatric:        Mood and Affect: Mood normal.        Behavior: Behavior normal.     (all labs ordered are listed, but only abnormal results are displayed) Labs Reviewed  CBC WITH DIFFERENTIAL/PLATELET  COMPREHENSIVE METABOLIC PANEL WITH GFR  LIPASE, BLOOD  URINALYSIS, ROUTINE W REFLEX MICROSCOPIC    EKG: None  Radiology: DG Chest Portable 1 View Result Date: 11/05/2023 CLINICAL DATA:  Choking episode.  Confusion. EXAM: PORTABLE CHEST 1 VIEW COMPARISON:  Radiograph 04/24/2023 FINDINGS: Left-sided pacemaker with unchanged lead. Left chest wall loop recorder. There is opacification of the lower left hemithorax partially obscuring the hemidiaphragm. Cardiomegaly is again seen. Calcified mediastinal lymph nodes, chronic. The right lung is clear. No pulmonary edema. No pneumothorax. IMPRESSION: 1. Opacification of the lower left hemithorax, may represent atelectasis or pneumonia with pleural effusion. 2. Cardiomegaly. Electronically Signed   By: [name] [name] M.D.   On: 11/05/2023 15:18     Procedures   Medications Ordered in the ED - No data to display   Medical Decision Making [name] [name] is a 78 y.o. male patient who presents to the emergency department today for further  evaluation of altered mental status.  Patient has dementia at baseline.  This seems to be at his baseline after some chart review.  Will plan to get some basic labs and a chest x-ray to look for any signs of early aspiration pneumonia.  Vital signs are otherwise normal.  If all else is normal we will plan to discharge him back to memory care.  Workup is still pending. Xray shows potential pneumonia in the setting of recently choking on some food. If all other labs are normal, I think the patient can still be discharged with pneumonia coverage. Due to shift change, the rest of the patient's care will be transferred to oncoming provider where ultimate  disposition is still pending.   Amount and/or Complexity of Data Reviewed Labs: ordered. Radiology: ordered.     Final diagnoses:  None    ED Discharge Orders     None          Theotis Cameron HERO, [place] 11/05/23 1536    Ula Prentice SAUNDERS, MD 11/05/23 [phone]

## 2023-11-05 NOTE — TOC Initial Note (Signed)
 Transition of Care [facility]) - Initial/Assessment Note    Patient Details  Name: [name] [name] MRN: [id number] Date of Birth: 19-Aug-1945  Transition of Care [facility]) CM/SW Contact:    Hartley KATHEE Robertson, LCSWA Phone Number: 11/05/2023, 7:38 PM  Clinical Narrative:                  CSW received request to speak with pt's daughter and spouse via phone, pt's daughter states pt is from [facility] and the family was given a 30 day notice about a week or so ago, she states she was told that pt needed a higher level of care. Pt's daughter stated she was told if pt needed emergency care or was admitted to the hospital he would not be allowed to return, she said she was contacted today stating pt was choking, however EMS stated pt was fine. CSW explained pt would be assessed by PT/OT once medically stable to help determine appropriate disposition. Inpatient Care Management will continue to follow.        Patient Goals and CMS Choice            Expected Discharge Plan and Services                                              Prior Living Arrangements/Services                       Activities of Daily Living      Permission Sought/Granted                  Emotional Assessment              Admission diagnosis:  Weakness Patient Active Problem List   Diagnosis Date Noted   Bradycardia 04/25/2023   S/P placement of cardiac pacemaker 04/24/2023   Concussion 01/09/2023   OSA (obstructive sleep apnea) 01/09/2023   CAD (coronary artery disease) of artery bypass graft 01/09/2023   Dementia with behavioral disturbance (HCC) 01/09/2023   Atrial fibrillation (HCC) 01/09/2023   Chronic anticoagulation 01/09/2023   HTN (hypertension) 01/09/2023   Bipolar disorder (HCC) 01/09/2023   Dementia with behavioral disturbance (HCC) 01/04/2023   Fall at home, initial encounter 12/18/2022   Anemia of chronic disease 12/18/2022   Generalized weakness  12/17/2022   History of loop recorder - STJ 04/15/2022   Syncope, cardiogenic 11/24/2021   Intermittent complete heart block (HCC) 11/24/2021   Obstructive sleep apnea 02/14/2021   Urinary urgency 07/11/2020   History of total knee arthroplasty 12/16/2018   Osteoarthritis of left knee 06/04/2017   B12 deficiency 03/19/2017   Loss of memory 03/18/2017   Diplopia 08/06/2016   History of atrial fibrillation 08/06/2016   History of bipolar disorder 08/06/2016   History of restless legs syndrome 08/06/2016   Weakness 02/10/2016   Essential tremor 07/17/2015   Mixed Alzheimer's and vascular dementia (HCC) 10/28/2013   Parkinsonian features 03/31/2013   History of stroke 03/23/2013   Hyperlipidemia 03/20/2013   Brain TIA 03/18/2013   Gait instability 03/17/2013   Chronic atrial fibrillation (HCC) 03/17/2013   Essential hypertension 03/17/2013   PCP:  System, Provider Not In Pharmacy:   [facility] - [place], [place] - [address] [place] [address] Phone: [phone] Fax: [phone]  [place] -  [facility] [address] [place] [address] Phone: [phone] Fax: [phone]  [facility] [address] [place], [place] - [address] [place] [address] Phone: [phone] Fax: [phone]     Social Drivers of Health (SDOH) Social History: SDOH Screenings   Food Insecurity: Patient Unable To Answer (04/25/2023)  Housing: Patient Unable To Answer (04/25/2023)  Transportation Needs: Patient Unable To Answer (04/25/2023)  Utilities: Patient Unable To Answer (04/25/2023)  Social Connections: Patient Unable To Answer (04/25/2023)  Tobacco Use: Medium Risk (11/05/2023)   SDOH Interventions:     Readmission Risk Interventions     No data to display

## 2023-11-05 NOTE — ED Notes (Signed)
 Pt daughter [name] notified that pt is currently up for discharge. Pt daughter stated that she will not be transporting pt back to her house. Stated that the family has their hands tied up.

## 2023-11-05 NOTE — Discharge Instructions (Addendum)
 [name] [name] was seen in the emergency department for choking event at his memory care facility.  While he was here we did labs, chest x-ray and talk to our hospitalist team.  Although we were unable to collect urine, we treated him for a presumed UTI with a one-time dose of fosfomycin.  We will also send him home with antibiotics for developing pneumonia.  Please bring back to the emergency department if he experiences high fevers, shortness of breath, develops chest pain, develops weakness on one side of the body or if you have any other reason to believe that he needs emergency care.

## 2023-11-06 DIAGNOSIS — R4182 Altered mental status, unspecified: Secondary | ICD-10-CM | POA: Diagnosis not present

## 2023-11-06 LAB — URINALYSIS, ROUTINE W REFLEX MICROSCOPIC
Bacteria, UA: NONE SEEN
Bilirubin Urine: NEGATIVE
Glucose, UA: NEGATIVE mg/dL
Hgb urine dipstick: NEGATIVE
Ketones, ur: 5 mg/dL — AB
Leukocytes,Ua: NEGATIVE
Nitrite: NEGATIVE
Protein, ur: 30 mg/dL — AB
Specific Gravity, Urine: 1.02 (ref 1.005–1.030)
pH: 8 (ref 5.0–8.0)

## 2023-11-06 MED ORDER — OLANZAPINE 5 MG PO TBDP
5.0000 mg | ORAL_TABLET | Freq: Every day | ORAL | Status: DC | PRN
  Administered 2023-11-06 – 2023-11-09 (×5): 5 mg via ORAL
  Filled 2023-11-06 (×6): qty 1

## 2023-11-06 MED ORDER — OLANZAPINE 5 MG PO TBDP
5.0000 mg | ORAL_TABLET | Freq: Once | ORAL | Status: AC
  Administered 2023-11-06: 5 mg via ORAL
  Filled 2023-11-06: qty 1

## 2023-11-06 MED ORDER — OLANZAPINE 2.5 MG PO TABS
2.5000 mg | ORAL_TABLET | Freq: Once | ORAL | Status: AC
  Administered 2023-11-06: 2.5 mg via ORAL
  Filled 2023-11-06: qty 1

## 2023-11-06 MED ORDER — AMOXICILLIN-POT CLAVULANATE 875-125 MG PO TABS: 1.0000 | ORAL_TABLET | Freq: Two times a day (BID) | ORAL | 0 refills | Status: DC

## 2023-11-06 NOTE — Evaluation (Signed)
 Physical Therapy Evaluation Patient Details Name: [name] [name] MRN: [id number] DOB: 1946/02/14 Today's Date: 11/06/2023  History of Present Illness  78 y.o. male presents to [facility] hospital on 11/05/2023 with increased confusion after choking on a piece of food. X-ray shows potential PNA. PMH includes dementia.  Clinical Impression  Pt presents to PT with deficits in strength, power, gait, balance, endurance. Pt demonstrates significant instability with initial attempts at standing without support of DME. Pt requires frequent cues to utilize RW when up, able to ambulate for household distances with RW and assist for safety. Pt is at a high risk for falls due to instability and poor awareness of deficits. Patient will benefit from continued inpatient follow up therapy, <3 hours/day.        If plan is discharge home, recommend the following: A little help with walking and/or transfers;A lot of help with bathing/dressing/bathroom;Assistance with cooking/housework;Assistance with feeding;Direct supervision/assist for medications management;Assist for transportation;Direct supervision/assist for financial management;Help with stairs or ramp for entrance;Supervision due to cognitive status   Can travel by private vehicle   Yes    Equipment Recommendations Rolling walker (2 wheels) (if the pt does not have access to a RW already. Prior notes indicate he may have a RW however pt is unable to confirm)  Recommendations for Other Services       Functional Status Assessment Patient has had a recent decline in their functional status and demonstrates the ability to make significant improvements in function in a reasonable and predictable amount of time.     Precautions / Restrictions Precautions Precautions: Fall Recall of Precautions/Restrictions: Impaired Restrictions Weight Bearing Restrictions Per Provider Order: No      Mobility  Bed Mobility Overal bed mobility: Needs Assistance Bed  Mobility: Supine to Sit, Sit to Supine     Supine to sit: Min assist Sit to supine: Contact guard assist        Transfers Overall transfer level: Needs assistance Equipment used: None, Rolling walker (2 wheels) Transfers: Sit to/from Stand Sit to Stand: Min assist, Contact guard assist           General transfer comment: minA without DME, CGA with RW    Ambulation/Gait Ambulation/Gait assistance: Min assist Gait Distance (Feet): 120 Feet Assistive device: Rolling walker (2 wheels) Gait Pattern/deviations: Step-through pattern Gait velocity: reduced Gait velocity interpretation: <1.8 ft/sec, indicate of risk for recurrent falls   General Gait Details: slowed step-through gait, pt requires multiple cues to maintain grip on RW as pt often attempts to abandon device when entering bathrooms  Stairs            Wheelchair Mobility     Tilt Bed    Modified Rankin (Stroke Patients Only)       Balance Overall balance assessment: Needs assistance Sitting-balance support: No upper extremity supported, Feet supported Sitting balance-Leahy Scale: Fair     Standing balance support: Bilateral upper extremity supported, Reliant on assistive device for balance Standing balance-Leahy Scale: Poor                               Pertinent Vitals/Pain Pain Assessment Pain Assessment: No/denies pain    Home Living Family/patient expects to be discharged to:: Other (Comment)                   Additional Comments: pt was at Memory Care per chart review    Prior Function Prior Level of Function :  Patient poor historian/Family not available;History of Falls (last six months);Needs assist             Mobility Comments: pt is unable to provide a reliable history. Assume the pt is ambulatory based on current functional level. ADLs Comments: pt was requiring significant assistance for all ADLs at baseline per chart review in February 2025      Extremity/Trunk Assessment   Upper Extremity Assessment Upper Extremity Assessment: Generalized weakness    Lower Extremity Assessment Lower Extremity Assessment: Generalized weakness    Cervical / Trunk Assessment Cervical / Trunk Assessment: Kyphotic  Communication   Communication Communication: No apparent difficulties    Cognition Arousal: Alert Behavior During Therapy: Impulsive   PT - Cognitive impairments: History of cognitive impairments                       PT - Cognition Comments: pt with a history of dementia, able to state his birth month, otherwise not oriented to place time or situation Following commands: Impaired Following commands impaired: Follows one step commands with increased time, Follows multi-step commands inconsistently     Cueing Cueing Techniques: Verbal cues, Tactile cues, Visual cues     General Comments General comments (skin integrity, edema, etc.): pt in NAD, off monitors    Exercises     Assessment/Plan    PT Assessment Patient needs continued PT services  PT Problem List Decreased strength;Decreased activity tolerance;Decreased balance;Decreased mobility;Decreased knowledge of use of DME;Decreased safety awareness;Decreased knowledge of precautions       PT Treatment Interventions DME instruction;Gait training;Functional mobility training;Therapeutic activities;Therapeutic exercise;Balance training;Cognitive remediation;Neuromuscular re-education;Wheelchair mobility training;Patient/family education    PT Goals (Current goals can be found in the Care Plan section)  Acute Rehab PT Goals Patient Stated Goal: to improve balance, reduce falls risk PT Goal Formulation: Patient unable to participate in goal setting Time For Goal Achievement: 11/20/23 Potential to Achieve Goals: Fair    Frequency Min 2X/week     Co-evaluation               AM-PAC PT 6 Clicks Mobility  Outcome Measure Help needed turning  from your back to your side while in a flat bed without using bedrails?: A Little Help needed moving from lying on your back to sitting on the side of a flat bed without using bedrails?: A Little Help needed moving to and from a bed to a chair (including a wheelchair)?: A Little Help needed standing up from a chair using your arms (e.g., wheelchair or bedside chair)?: A Little Help needed to walk in hospital room?: A Little Help needed climbing 3-5 steps with a railing? : A Lot 6 Click Score: 17    End of Session Equipment Utilized During Treatment: Gait belt Activity Tolerance: Patient tolerated treatment well Patient left: in bed;with call bell/phone within reach;with nursing/sitter in room Nurse Communication: Mobility status PT Visit Diagnosis: Other abnormalities of gait and mobility (R26.89);Muscle weakness (generalized) (M62.81)    Time: 9164-9141 PT Time Calculation (min) (ACUTE ONLY): 23 min   Charges:   PT Evaluation $PT Eval Low Complexity: 1 Low   PT General Charges $$ ACUTE PT VISIT: 1 Visit         Bernardino JINNY Ruth, PT, DPT Acute Rehabilitation Office [phone]   Bernardino JINNY Ruth 11/06/2023, 9:12 AM

## 2023-11-06 NOTE — ED Notes (Signed)
 Pt not redirectable at this time

## 2023-11-06 NOTE — ED Provider Notes (Signed)
 78yo male unable to return to facility, does not meet criteria for admission.  Physical Exam  BP (!) 155/80 (BP Location: Left Arm)   Pulse 64   Temp 97.6 F (36.4 C) (Axillary)   Resp 18   Ht 5' 11 (1.803 m)   Wt 76 kg   SpO2 96%   BMI 23.37 kg/m   Physical Exam  Procedures  Procedures  ED Course / MDM   Clinical Course as of 11/09/23 1256  Thu Nov 05, 2023  1923 Sodium: 145 [AF]    Clinical Course User Index [AF] Dionisio Blunt, MD   Medical Decision Making Amount and/or Complexity of Data Reviewed Labs: ordered. Decision-making details documented in ED Course. Radiology: ordered.  Risk OTC drugs. Prescription drug management.   Care signed out at change of shift to oncoming provider pending assistance from social work in the morning.       Beverley Leita LABOR, PA-C 11/09/23 1256    Palumbo, April, MD 11/19/23 2312

## 2023-11-06 NOTE — ED Notes (Signed)
 Pt continually attempting to crawl out of the bed. Fall risk bracelet placed on pt with non-skid socks and fall risk mat per ED fall risk policy.

## 2023-11-06 NOTE — NC FL2 (Addendum)
 [place]  MEDICAID FL2 LEVEL OF CARE FORM     IDENTIFICATION  Patient Name: [name] [name] Birthdate: 1945-03-06 Sex: male Admission Date (Current Location): 11/05/2023  [facility] and [place] Number:  Producer, television/film/video and Address:  [facility], [address], [place], [place] [address]      Provider Number: [id number]  Attending Physician Name and Address:  Dr. Juliene Bicker  Relative Name and Phone Number:       Current Level of Care: Hospital Recommended Level of Care: Skilled Nursing Facility Prior Approval Number:    Date Approved/Denied:   PASRR Number: [id number]  Discharge Plan: SNF    Current Diagnoses: Patient Active Problem List   Diagnosis Date Noted   Bradycardia 04/25/2023   S/P placement of cardiac pacemaker 04/24/2023   Concussion 01/09/2023   OSA (obstructive sleep apnea) 01/09/2023   CAD (coronary artery disease) of artery bypass graft 01/09/2023   Dementia with behavioral disturbance (HCC) 01/09/2023   Atrial fibrillation (HCC) 01/09/2023   Chronic anticoagulation 01/09/2023   HTN (hypertension) 01/09/2023   Bipolar disorder (HCC) 01/09/2023   Dementia with behavioral disturbance (HCC) 01/04/2023   Fall at home, initial encounter 12/18/2022   Anemia of chronic disease 12/18/2022   Generalized weakness 12/17/2022   History of loop recorder - STJ 04/15/2022   Syncope, cardiogenic 11/24/2021   Intermittent complete heart block (HCC) 11/24/2021   Obstructive sleep apnea 02/14/2021   Urinary urgency 07/11/2020   History of total knee arthroplasty 12/16/2018   Osteoarthritis of left knee 06/04/2017   B12 deficiency 03/19/2017   Loss of memory 03/18/2017   Diplopia 08/06/2016   History of atrial fibrillation 08/06/2016   History of bipolar disorder 08/06/2016   History of restless legs syndrome 08/06/2016   Weakness 02/10/2016   Essential tremor 07/17/2015   Mixed Alzheimer's and vascular dementia (HCC) 10/28/2013    Parkinsonian features 03/31/2013   History of stroke 03/23/2013   Hyperlipidemia 03/20/2013   Brain TIA 03/18/2013   Gait instability 03/17/2013   Chronic atrial fibrillation (HCC) 03/17/2013   Essential hypertension 03/17/2013    Orientation RESPIRATION BLADDER Height & Weight     Self  Normal Continent Weight: 167 lb 8.8 oz (76 kg) Height:  5' 11 (180.3 cm)  BEHAVIORAL SYMPTOMS/MOOD NEUROLOGICAL BOWEL NUTRITION STATUS      Continent Diet (Dysphasia 2)  AMBULATORY STATUS COMMUNICATION OF NEEDS Skin   Limited Assist Verbally Normal                       Personal Care Assistance Level of Assistance  Bathing, Feeding, Dressing Bathing Assistance: Limited assistance Feeding assistance: Maximum assistance Dressing Assistance: Limited assistance     Functional Limitations Info  Sight, Hearing, Speech Sight Info: Adequate Hearing Info: Adequate Speech Info: Adequate    SPECIAL CARE FACTORS FREQUENCY  PT (By licensed PT), OT (By licensed OT)     PT Frequency: 5x weekly OT Frequency: 5x weekly            Contractures Contractures Info: Not present    Additional Factors Info  Code Status, Allergies Code Status Info: Full Code Allergies Info: Latex           Current Medications (11/06/2023):  This is the current hospital active medication list Current Facility-Administered Medications  Medication Dose Route Frequency Provider Last Rate Last Admin   acetaminophen  (TYLENOL ) tablet 650 mg  650 mg Oral BID PRN Dionisio Blunt, MD  atorvastatin  (LIPITOR) tablet 40 mg  40 mg Oral QPM Dionisio Blunt, MD   40 mg at 11/06/23 0001   carbidopa -levodopa  (SINEMET  IR) 25-250 MG per tablet immediate release 1 tablet  1 tablet Oral BID Dionisio Blunt, MD   1 tablet at 11/06/23 1039   divalproex  (DEPAKOTE ) DR tablet 250 mg  250 mg Oral TID Dionisio Blunt, MD   250 mg at 11/06/23 1039   donepezil  (ARICEPT ) tablet 10 mg  10 mg Oral QHS Flores, Alexander, MD   10  mg at 11/05/23 2359   lamoTRIgine  (LAMICTAL ) tablet 150 mg  150 mg Oral BID Dionisio Blunt, MD   150 mg at 11/06/23 1038   lisinopril  (ZESTRIL ) tablet 20 mg  20 mg Oral QPM Dionisio Blunt, MD   20 mg at 11/05/23 2357   memantine  (NAMENDA ) tablet 10 mg  10 mg Oral BID Dionisio Blunt, MD   10 mg at 11/06/23 1039   mirtazapine  (REMERON ) tablet 15 mg  15 mg Oral QHS Flores, Alexander, MD   15 mg at 11/05/23 2357   torsemide  (DEMADEX ) tablet 10 mg  10 mg Oral Daily Dionisio Blunt, MD   10 mg at 11/06/23 1039   ziprasidone  (GEODON ) capsule 40 mg  40 mg Oral QPM Dionisio Blunt, MD   40 mg at 11/05/23 2355   ziprasidone  (GEODON ) capsule 60 mg  60 mg Oral Daily Dionisio Blunt, MD   60 mg at 11/06/23 1039   Current Outpatient Medications  Medication Sig Dispense Refill   acetaminophen  (TYLENOL ) 325 MG tablet Take 650 mg by mouth in the morning and at bedtime.     ALPRAZolam (XANAX) 1 MG tablet Take 1 mg by mouth 2 (two) times daily as needed for anxiety.     amoxicillin -clavulanate (AUGMENTIN ) 400-57 MG/5ML suspension Take 10 mLs (800 mg total) by mouth 2 (two) times daily for 7 days. 140 mL 0   atorvastatin  (LIPITOR) 40 MG tablet Take 1 tablet (40 mg total) by mouth every evening. Please call our office to schedule an yearly appointment with Dr. Court for November 2024 before anymore refills. [phone] 30 tablet 1   carbidopa -levodopa  (SINEMET  IR) 25-250 MG tablet Take 1 tablet by mouth 2 (two) times daily.     divalproex  (DEPAKOTE ) 125 MG DR tablet Take 250 mg by mouth 3 (three) times daily.     donepezil  (ARICEPT ) 10 MG tablet Take 10 mg by mouth at bedtime.     eszopiclone (LUNESTA) 2 MG TABS tablet Take 2 mg by mouth at bedtime. Take immediately before bedtime     folic acid  (FOLVITE ) 1 MG tablet Take 1 mg by mouth at bedtime.     lamoTRIgine  (LAMICTAL ) 150 MG tablet Take 150 mg by mouth 2 (two) times daily.     lisinopril  (ZESTRIL ) 10 MG tablet Take 1 tablet (10 mg total) by  mouth every evening. Please call our office to schedule an yearly appointment with Dr. Court for November 2024 before anymore refills. [phone]. Thank you 1st attempt (Patient taking differently: Take 20 mg by mouth every evening. Please call our office to schedule an yearly appointment with Dr. Court for November 2024 before anymore refills. [phone]. Thank you 1st attempt) 30 tablet 1   memantine  (NAMENDA ) 10 MG tablet Take 10 mg by mouth 2 (two) times daily.     mirtazapine  (REMERON ) 15 MG tablet Take 15 mg by mouth at bedtime.     REPATHA SURECLICK 140 MG/ML SOAJ Inject 140 mg into the skin every 14 (  fourteen) days.     torsemide  (DEMADEX ) 10 MG tablet Take 10 mg by mouth daily.     ziprasidone  (GEODON ) 40 MG capsule Take 40 mg by mouth every evening. To be given in addition to 60mg  in the morning for a total daily dose of 100mg .     ziprasidone  (GEODON ) 60 MG capsule Take 60 mg by mouth in the morning. To be given in addition to 40mg  in the evening for a total daily dose of 100mg .       Discharge Medications: Please see discharge summary for a list of discharge medications.  Relevant Imaging Results:  Relevant Lab Results:   Additional Information SSN: [id number]  Niels LITTIE Portugal, LCSW

## 2023-11-06 NOTE — Evaluation (Signed)
 Clinical/Bedside Swallow Evaluation Patient Details  Name: [name] [name] MRN: [id number] Date of Birth: 1945/04/05  Today's Date: 11/06/2023 Time: SLP Start Time (ACUTE ONLY): 0905 SLP Stop Time (ACUTE ONLY): 9076 SLP Time Calculation (min) (ACUTE ONLY): 18 min  Past Medical History:  Past Medical History:  Diagnosis Date   Alzheimer disease (HCC)    Anxiety    Arthritis    Atrial fibrillation (HCC)    Chronic   Bipolar disorder (HCC)    Chronic low back pain    Dementia (HCC)    Depression    Dizziness    Dysrhythmia    a fib   ED (erectile dysfunction)    External hemorrhoids    Fatigue    HLD (hyperlipidemia)    HTN (hypertension)    Hypogonadism in male    Lipoma of skin    Memory loss    Mixed Alzheimer's and vascular dementia (HCC)    Poor balance    Restless legs syndrome (RLS)    S/P placement of cardiac pacemaker 04/24/2023   Medtronic single lead PPM    Sleep apnea    cpap   Stroke (HCC) 1991   Tremor    Past Surgical History:  Past Surgical History:  Procedure Laterality Date   COLONOSCOPY     KNEE ARTHROPLASTY Left 06/04/2017   Procedure: LEFT TOTAL KNEE ARTHROPLASTY WITH COMPUTER NAVIGATION;  Surgeon: Fidel Rogue, MD;  Location: [facility];  Service: Orthopedics;  Laterality: Left;  NEEDS RNFA   LOOP RECORDER INSERTION N/A 11/25/2021   Procedure: LOOP RECORDER INSERTION;  Surgeon: Fernande Elspeth BROCKS, MD;  Location: [facility] INVASIVE CV LAB;  Service: Cardiovascular;  Laterality: N/A;   PACEMAKER IMPLANT N/A 04/24/2023   Procedure: PACEMAKER IMPLANT;  Surgeon: Fernande Elspeth BROCKS, MD;  Location: [facility] INVASIVE CV LAB;  Service: Cardiovascular;  Laterality: N/A;   TONSILLECTOMY AND ADENOIDECTOMY     TOTAL HIP ARTHROPLASTY Right 07/31/2016   Procedure: RIGHT TOTAL HIP ARTHROPLASTY ANTERIOR APPROACH;  Surgeon: Fidel Rogue, MD;  Location: [facility];  Service: Orthopedics;  Laterality: Right;  Requesting RNFA   HPI:  78 y.o. male presents to [facility] hospital on 11/05/2023 with  increased confusion after choking on a piece of food. X-ray shows potential PNA. Pt has been evaluated by SLP in previous admissions (November 2024 and February 2025) with cognitively-based oral dysphagia and fluctuating mentation that impacts intake. He has been recommended to be on Dys 1 or 2 diet with thin liquids with careful assistance during feeding. PMH includes dementia.    Assessment / Plan / Recommendation  Clinical Impression  [name] [name] is alert and pleasantly confused, but redirectable with cues. He coughed on the initial sip of water , but had no other overt s/s of aspiration with thin liquids even when used as a liquid wash or when consumed via large, consecutive straw sips at the pt's own pacing. He did have some additional coughing with solid trials, reporting subjectively that it didn't go down, but this improved with smaller, softened pieces of food. Recommend starting Dys 2 diet and thin liquids. Note that pt has a chronic component to his dysphagia, and that his mentation will need to be carefully monitored before and during any PO intake. Assistance with meals and implementation of aspiration precautions is recommended.  SLP Visit Diagnosis: Dysphagia, unspecified (R13.10)    Aspiration Risk       Diet Recommendation Dysphagia 2 (Fine chop);Thin liquid    Liquid Administration via: Cup;Straw Medication Administration: Crushed with puree Supervision:  Staff to assist with self feeding;Full supervision/cueing for compensatory strategies Compensations: Minimize environmental distractions;Slow rate;Small sips/bites Postural Changes: Seated upright at 90 degrees;Remain upright for at least 30 minutes after po intake    Other  Recommendations Oral Care Recommendations: Oral care BID     Assistance Recommended at Discharge    Functional Status Assessment Patient has had a recent decline in their functional status and demonstrates the ability to make significant improvements in function  in a reasonable and predictable amount of time.  Frequency and Duration min 2x/week  2 weeks       Prognosis Prognosis for improved oropharyngeal function: Fair Barriers to Reach Goals: Cognitive deficits;Time post onset      Swallow Study   General HPI: 78 y.o. male presents to [facility] hospital on 11/05/2023 with increased confusion after choking on a piece of food. X-ray shows potential PNA. Pt has been evaluated by SLP in previous admissions (November 2024 and February 2025) with cognitively-based oral dysphagia and fluctuating mentation that impacts intake. He has been recommended to be on Dys 1 or 2 diet with thin liquids with careful assistance during feeding. PMH includes dementia. Type of Study: Bedside Swallow Evaluation Previous Swallow Assessment: see HPI Diet Prior to this Study: NPO Temperature Spikes Noted: No Respiratory Status: Room air History of Recent Intubation: No Behavior/Cognition: Alert;Cooperative;Pleasant mood;Confused;Requires cueing Oral Cavity Assessment: Within Functional Limits Oral Care Completed by SLP: No Oral Cavity - Dentition: Adequate natural dentition Vision: Functional for self-feeding Self-Feeding Abilities: Needs assist Patient Positioning: Upright in bed Baseline Vocal Quality: Normal Volitional Cough: Strong Volitional Swallow: Able to elicit    Oral/Motor/Sensory Function Overall Oral Motor/Sensory Function: Within functional limits   Ice Chips Ice chips: Not tested   Thin Liquid Thin Liquid: Impaired Presentation: Cup;Self Fed;Straw Pharyngeal  Phase Impairments: Cough - Immediate (x1)    Nectar Thick Nectar Thick Liquid: Not tested   Honey Thick Honey Thick Liquid: Not tested   Puree Puree: Within functional limits Presentation: Spoon   Solid     Solid: Impaired Presentation: Self Fed Pharyngeal Phase Impairments: Cough - Immediate      [name], M.A. CCC-SLP Acute Rehabilitation Services Office: [phone]  Secure chat  preferred  11/06/2023,10:22 AM

## 2023-11-06 NOTE — ED Notes (Signed)
 Patient trying to get out of the bed. Not redirectable. Patient confused and restless. He is intermittently yelling and not cooperative. Pt fall risk. Sitter at bedside trying to keep patient in bed. MD aware- request for medication for restless/agitation.

## 2023-11-06 NOTE — ED Notes (Signed)
 Patient repositioned in the bed, blanket provided. TV on.

## 2023-11-06 NOTE — Evaluation (Signed)
 Occupational Therapy Evaluation Patient Details Name: [name] [name] MRN: [id number] DOB: [birth year] Today's Date: 11/06/2023   History of Present Illness   78 y.o. male presents to [facility] hospital on 11/05/2023 with increased confusion after choking on a piece of food. X-ray shows potential PNA. PMH includes dementia.     Clinical Impressions [name] [name] was evaluated s/p the above admission list. Per chart review, he was at a memory care unit PTA, unsure of his level of assist. Upon evaluation the pt was limited by generalized weakness, impaired cognition, unsteady balance and limited activity tolerance. Overall he needed CGA for bed mobility and functional mobility with RW. Due to the deficits listed below the pt also needs up to mod A for LB ADLs and min A for UB ADLs. Pt also requires significant cues for initiation, sequencing and sustaining functional tasks. He was pleasantly confused and easily re-directed throughout. Pt will benefit from continued acute OT services and skilled inpatient follow up therapy, <3 hours/day.      If plan is discharge home, recommend the following:   A little help with walking and/or transfers;A lot of help with bathing/dressing/bathroom;Assistance with cooking/housework;Assist for transportation;Help with stairs or ramp for entrance     Functional Status Assessment   Patient has had a recent decline in their functional status and demonstrates the ability to make significant improvements in function in a reasonable and predictable amount of time.     Equipment Recommendations   None recommended by OT      Precautions/Restrictions   Precautions Precautions: Fall Recall of Precautions/Restrictions: Impaired Restrictions Weight Bearing Restrictions Per Provider Order: No     Mobility Bed Mobility Overal bed mobility: Needs Assistance Bed Mobility: Supine to Sit, Sit to Supine     Supine to sit: Contact guard Sit to supine: Contact guard  assist        Transfers Overall transfer level: Needs assistance Equipment used: Rolling walker (2 wheels) Transfers: Sit to/from Stand Sit to Stand: Contact guard assist                  Balance Overall balance assessment: Needs assistance Sitting-balance support: No upper extremity supported, Feet supported Sitting balance-Leahy Scale: Fair     Standing balance support: Bilateral upper extremity supported, Reliant on assistive device for balance Standing balance-Leahy Scale: Poor                             ADL either performed or assessed with clinical judgement   ADL Overall ADL's : Needs assistance/impaired Eating/Feeding: Set up   Grooming: Supervision/safety;Sitting;Standing   Upper Body Bathing: Minimal assistance   Lower Body Bathing: Sit to/from stand;Moderate assistance   Upper Body Dressing : Minimal assistance;Sitting   Lower Body Dressing: Moderate assistance;Sit to/from stand   Toilet Transfer: Contact guard assist;Ambulation;Rolling walker (2 wheels)   Toileting- Clothing Manipulation and Hygiene: Contact guard assist       Functional mobility during ADLs: Contact guard assist General ADL Comments: pt is likely near his functional baseline. RW used for safety, pt with fair management of RW during ADLs. He requires min A at least for all tasks due to impaired cognition, pt is easily re-directed to tasks despite confusion and inattention     Vision Baseline Vision/History: 0 No visual deficits Vision Assessment?: No apparent visual deficits     Perception Perception: Not tested       Praxis Praxis: Not tested  Pertinent Vitals/Pain       Extremity/Trunk Assessment Upper Extremity Assessment Upper Extremity Assessment: Generalized weakness   Lower Extremity Assessment Lower Extremity Assessment: Defer to PT evaluation   Cervical / Trunk Assessment Cervical / Trunk Assessment: Kyphotic   Communication  Communication Communication: No apparent difficulties   Cognition Arousal: Alert Behavior During Therapy: Impulsive     Following commands: Impaired Following commands impaired: Follows one step commands with increased time, Follows multi-step commands inconsistently     Cueing  General Comments   Cueing Techniques: Verbal cues;Tactile cues;Visual cues  VSS, coughing upon arrival but subsided with mobility           Home Living Family/patient expects to be discharged to:: Other (Comment)           Additional Comments: pt was at Memory Care per chart review      Prior Functioning/Environment Prior Level of Function : Patient poor historian/Family not available;History of Falls (last six months);Needs assist             Mobility Comments: pt is unable to provide a reliable history. Assume the pt is ambulatory based on current functional level. ADLs Comments: pt was requiring significant assistance for all ADLs at baseline per chart review in February 2025    OT Problem List: Decreased strength;Decreased range of motion;Decreased activity tolerance;Impaired balance (sitting and/or standing);Decreased safety awareness;Decreased cognition;Decreased knowledge of precautions;Decreased knowledge of use of DME or AE   OT Treatment/Interventions: Therapeutic exercise;Self-care/ADL training;DME and/or AE instruction;Patient/family education;Balance training;Therapeutic activities      OT Goals(Current goals can be found in the care plan section)   Acute Rehab OT Goals Patient Stated Goal: unable to state OT Goal Formulation: With patient Time For Goal Achievement: 11/20/23 Potential to Achieve Goals: Good ADL Goals Pt Will Perform Grooming: with supervision Pt Will Perform Upper Body Dressing: with supervision Pt Will Perform Lower Body Dressing: with supervision Pt Will Transfer to Toilet: with supervision;ambulating   OT Frequency:  Min 1X/week       AM-PAC  OT 6 Clicks Daily Activity     Outcome Measure Help from another person eating meals?: A Little Help from another person taking care of personal grooming?: A Little Help from another person toileting, which includes using toliet, bedpan, or urinal?: A Little Help from another person bathing (including washing, rinsing, drying)?: A Lot Help from another person to put on and taking off regular upper body clothing?: A Little Help from another person to put on and taking off regular lower body clothing?: A Lot 6 Click Score: 16   End of Session Equipment Utilized During Treatment: Gait belt;Rolling walker (2 wheels) Nurse Communication: Mobility status  Activity Tolerance: Patient tolerated treatment well Patient left: in bed;with call bell/phone within reach;with nursing/sitter in room  OT Visit Diagnosis: Unsteadiness on feet (R26.81);Other abnormalities of gait and mobility (R26.89);Muscle weakness (generalized) (M62.81)                Time: 9066-9049 OT Time Calculation (min): 17 min Charges:  OT General Charges $OT Visit: 1 Visit OT Evaluation $OT Eval Moderate Complexity: 1 Mod  Lucie Kendall, OTR/L Acute Rehabilitation Services Office [phone] Secure Chat Communication Preferred   Lucie JONETTA Kendall 11/06/2023, 10:17 AM
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
## 2023-11-07 DIAGNOSIS — R4182 Altered mental status, unspecified: Secondary | ICD-10-CM | POA: Diagnosis not present

## 2023-11-07 NOTE — ED Notes (Signed)
 Daughter [name] [phone] would like an update asap

## 2023-11-07 NOTE — ED Notes (Signed)
 Family updated as to patient's status.

## 2023-11-07 NOTE — ED Notes (Signed)
 Entered pt's room d/t bed alarm triggering, pt attempting to crawl out of bed, legs dangling on outside of bed rail. Pt intermittently screaming. Pt oriented to self only, otherwise speaking gibberish. Bed alarm remains on.

## 2023-11-07 NOTE — ED Notes (Signed)
 Pt linens and gown changed, peri care completed. Pt repositioned in bed with bed alarm on and both side rails up. Pt provided with warm blanket. Pt 's door remains open.

## 2023-11-07 NOTE — ED Notes (Signed)
 Pt continues to shout out he needs help and bed alarm continues to alert. Pt is speaking gibberish but it redirectable upon entering room. Pt repositioned in bed and bed alarm remains on.

## 2023-11-07 NOTE — ED Provider Notes (Signed)
  Physical Exam  BP (!) 147/75   Pulse 64   Temp 98 F (36.7 C) (Oral)   Resp 18   Ht 5' 11 (1.803 m)   Wt 76 kg   SpO2 100%   BMI 23.37 kg/m   Physical Exam  Procedures  Procedures  ED Course / MDM   Clinical Course as of 11/07/23 0715  Thu Nov 05, 2023  1923 Sodium: 145 [AF]    Clinical Course User Index [AF] Dionisio Blunt, MD   Medical Decision Making Amount and/or Complexity of Data Reviewed Labs: ordered. Decision-making details documented in ED Course. Radiology: ordered.  Risk OTC drugs. Prescription drug management.   The memory care where the patient was residing will not take him back.  Reportedly had a wrong and fell to also.  Also had been given 30-day notice almost 2 weeks ago.  It appears social work is attempting to find placement.       Patsey Lot, MD 11/07/23 [phone]

## 2023-11-08 DIAGNOSIS — R4182 Altered mental status, unspecified: Secondary | ICD-10-CM | POA: Diagnosis not present

## 2023-11-08 NOTE — ED Notes (Signed)
 PT ATE MOST OF HIS BREAKFAST.  PT BEGAN TO COUGH MORE AFTER FEEDING NURSE AND PROVIDER WAS INFORMED.
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
## 2023-11-08 NOTE — ED Notes (Signed)
 PT BED REPLACED WITH HOSPITAL BED WITH ALARM PT CONTINUES TO MOVE ABOUT AND SCOOT OUT OF BED THROWING LEGS OVER THE RAILS DESPITE INTERVENTIONS. NURSE INFORMED

## 2023-11-08 NOTE — ED Provider Notes (Signed)
 Emergency Medicine Observation Re-evaluation Note  [name] [name] is a 78 y.o. male, seen on rounds today.  Pt initially presented to the ED for complaints of Altered Mental Status Currently, the patient is denying any current complaints.  He reports he is doing well and then starts jabbering on about nothing that makes sense.  Physical Exam  BP (!) 150/98 (BP Location: Left Arm)   Pulse 77   Temp (!) 97.5 F (36.4 C) (Oral)   Resp 16   Ht 5' 11 (1.803 m)   Wt 76 kg   SpO2 100%   BMI 23.37 kg/m  Physical Exam General: No acute distress, happy and cooperative  Cardiac: Regular Lungs: Clear Psych: Calm and cooperative  ED Course / MDM  EKG:   I have reviewed the labs performed to date as well as medications administered while in observation.  Recent changes in the last 24 hours include speech therapy did see the patient 2 days ago and recommended a dysphagia 2 diet.  Patient was able to eat well today per the tech who fed him.  She has noticed a little more coughing after eating but not while eating.  Oxygen saturations have been normal and he is having no difficulty breathing here..  Plan  Current plan is for placement.    Doretha Folks, MD 11/08/23 [phone]

## 2023-11-08 NOTE — ED Notes (Signed)
 Pt had significant coughing episode lasting a few minutes after taking medications, starting approximately at 2200. EDP made aware directed pt NPO until bedside swallow screening completed in am.  Pt resting at this time no longer coughing.

## 2023-11-08 NOTE — ED Provider Notes (Signed)
 Patient had some coughing after taking his medications.  Appears to be an isolated event.  Will do a bedside swallow on him in the morning and if abnormal will order SLP evaluation   [name] Lamar BROCKS, MD 11/08/23 2226

## 2023-11-08 NOTE — Progress Notes (Signed)
 CSW spoke to [place] intake. At this time AUTH is still pending. ICM will follow up tomorrow with more updates.

## 2023-11-09 ENCOUNTER — Emergency Department (HOSPITAL_COMMUNITY)

## 2023-11-09 DIAGNOSIS — R918 Other nonspecific abnormal finding of lung field: Secondary | ICD-10-CM | POA: Diagnosis not present

## 2023-11-09 DIAGNOSIS — I517 Cardiomegaly: Secondary | ICD-10-CM | POA: Diagnosis not present

## 2023-11-09 DIAGNOSIS — R4182 Altered mental status, unspecified: Secondary | ICD-10-CM | POA: Diagnosis not present

## 2023-11-09 DIAGNOSIS — R059 Cough, unspecified: Secondary | ICD-10-CM | POA: Diagnosis not present

## 2023-11-09 MED ORDER — DIVALPROEX SODIUM 125 MG PO CSDR
250.0000 mg | DELAYED_RELEASE_CAPSULE | Freq: Three times a day (TID) | ORAL | Status: DC
  Administered 2023-11-09 – 2023-11-10 (×3): 250 mg via ORAL
  Filled 2023-11-09 (×3): qty 2

## 2023-11-09 NOTE — Progress Notes (Signed)
 Speech Language Pathology Treatment: Dysphagia  Patient Details Name: [name] [name] MRN: [id number] DOB: 1945/05/28 Today's Date: 11/09/2023 Time: 8971-8958 SLP Time Calculation (min) (ACUTE ONLY): 13 min  Assessment / Plan / Recommendation Clinical Impression  Pt was seen this morning after having ongoing, intermittent signs of dysphagia with staff. Chart reviewed and discussed with RN, who reports that pt would look like he was doing fine and then have overt coughing after the next bolus. Pt is not quite as alert and interactive as he was with SLP during initial evaluation. He had no overt s/s of aspiration initially but started to become more lethargic as session progressed and he coughed on final bolus of thin liquids. Note that during previous admissions, pt has had fluctuating swallowing due to underlying dementia. They have had conversations with SLP team and palliative care, who both acknowledged that feeding tubes are typically contraindicated with individuals with advanced dementia. At the time, family had agreed and decided to continue with diet with careful assistance during meals. Family is not present at this time, but will work on reaching out to them. Pending further conversations with family, would hold on POs.    HPI HPI: 78 y.o. male presents to [facility] hospital on 11/05/2023 with increased confusion after choking on a piece of food. X-ray shows potential PNA. Pt has been evaluated by SLP in previous admissions (November 2024 and February 2025) with cognitively-based oral dysphagia and fluctuating mentation that impacts intake. He has been recommended to be on Dys 1 or 2 diet with thin liquids with careful assistance during feeding. PMH includes dementia.      SLP Plan  Continue with current plan of care          Recommendations  Diet recommendations:  (TBA pending conversation with family)                  Oral care QID     Dysphagia, unspecified (R13.10)      Continue with current plan of care     [name] [name]., M.A. CCC-SLP Acute Rehabilitation Services Office: [phone]  Secure chat preferred   11/09/2023, 12:06 PM

## 2023-11-09 NOTE — ED Provider Notes (Signed)
 Emergency Medicine Observation Re-evaluation Note  [name] [name] is a 78 y.o. male, seen on rounds today.  Pt initially presented to the ED for complaints of Altered Mental Status Currently, the patient is sleeping  Physical Exam  BP (!) 173/80 (BP Location: Left Arm)   Pulse 66   Temp 97.6 F (36.4 C) (Oral)   Resp 17   Ht 5' 11 (1.803 m)   Wt 76 kg   SpO2 97%   BMI 23.37 kg/m  Physical Exam General: NAD Cardiac: Regular heart rate Lungs: No respiratory distress Psych: Stable  ED Course / MDM  EKG:   I have reviewed the labs performed to date as well as medications administered while in observation.   Plan  Current plan is for [facility] assistance with memory care placement.    [name] Donnice PARAS, MD 11/09/23 [phone]

## 2023-11-09 NOTE — ED Notes (Signed)
 Assuming care of pt at this time. He is currently on soft restraint. AAOx1

## 2023-11-09 NOTE — ED Notes (Signed)
 PT having difficulty swallowing applesauce or water , coughing after applesauce w/ meds

## 2023-11-09 NOTE — Progress Notes (Addendum)
 CSW called [facility] to check on auth. Informed that shara is still pending to be reviewed by clinician.   1345: Auth still pending at this time. Ref# [id number]  1505: Update pt's daughter [name] over the phone. Checked auth again; it is still pending.

## 2023-11-09 NOTE — ED Notes (Signed)
 Coughing spell noted post PO meds. Spell lasted for about five minutes

## 2023-11-09 NOTE — ED Notes (Signed)
 Attempted to feed pt meal from tray. Pt had difficulty swallowing. He started to cough repeatedly.

## 2023-11-09 NOTE — Progress Notes (Signed)
 Speech Language Pathology Treatment: Dysphagia  Patient Details Name: [name] [name] MRN: [id number] DOB: 06/19/1945 Today's Date: 11/09/2023 Time: 8850-8840 SLP Time Calculation (min) (ACUTE ONLY): 10 min  Assessment / Plan / Recommendation Clinical Impression  SLP called pt's wife, [name], to discuss swallowing further. [name] says that she has not seen him during meals as much since he has been in the facility (since last October - I asked if I should call their daughter, but she said no, that she has been able to see him more than their daughter has). She acknowledges that he has had dysphagia and that he was on nectar thick liquids most recently. SLP provided education about current presentation including fluctuating performance likely related to underlying dementia. [name] continues to say that she would not want a feeding tube for him. She would like for him to resume his previous diet, including thickened liquids, as she felt like he was doing fairly well on that up until events precipitating this admission. She would like staff assistance during meals, but verbalizes that he will be at ongoing risk for aspiration and/or inadequate nutrition/hydration. This is in line with what she had requested during previous palliative care conversations as well.   SLP returned to pt's room to attempt additional trials including nectar thick liquids. Pt was very lethargic, but with Mod cues for awareness and acceptance, he drank nectar thick liquids without overt signs of aspiration. Did not offer additional solids given that his level of alertness was still reduced. Will adjust diet to Dys 1 (puree) solids and nectar thick liquids with full supervision. Pt is likely to continue to fluctuate, so will require ongoing assistance and may need to take more frequent breaks.    HPI HPI: 78 y.o. male presents to [facility] hospital on 11/05/2023 with increased confusion after choking on a piece of food. X-ray shows potential PNA. Pt  has been evaluated by SLP in previous admissions (November 2024 and February 2025) with cognitively-based oral dysphagia and fluctuating mentation that impacts intake. He has been recommended to be on Dys 1 or 2 diet with thin liquids with careful assistance during feeding. PMH includes dementia.      SLP Plan  Continue with current plan of care          Recommendations  Diet recommendations: Dysphagia 1 (puree);Nectar-thick liquid Liquids provided via: Cup;Straw;Teaspoon Medication Administration: Crushed with puree (or could crush into nectar thick liquid) Supervision: Staff to assist with self feeding;Full supervision/cueing for compensatory strategies Compensations: Minimize environmental distractions;Slow rate;Small sips/bites Postural Changes and/or Swallow Maneuvers: Seated upright 90 degrees;Upright 30-60 min after meal                  Oral care QID     Dysphagia, unspecified (R13.10)     Continue with current plan of care     [name], M.A. CCC-SLP Acute Rehabilitation Services Office: [phone]  Secure chat preferred   11/09/2023, 12:14 PM

## 2023-11-10 DIAGNOSIS — F0280 Dementia in other diseases classified elsewhere without behavioral disturbance: Secondary | ICD-10-CM | POA: Diagnosis not present

## 2023-11-10 DIAGNOSIS — N390 Urinary tract infection, site not specified: Secondary | ICD-10-CM | POA: Diagnosis not present

## 2023-11-10 DIAGNOSIS — J9600 Acute respiratory failure, unspecified whether with hypoxia or hypercapnia: Secondary | ICD-10-CM | POA: Diagnosis not present

## 2023-11-10 DIAGNOSIS — Z5181 Encounter for therapeutic drug level monitoring: Secondary | ICD-10-CM | POA: Diagnosis not present

## 2023-11-10 DIAGNOSIS — G20A1 Parkinson's disease without dyskinesia, without mention of fluctuations: Secondary | ICD-10-CM | POA: Diagnosis not present

## 2023-11-10 DIAGNOSIS — R1319 Other dysphagia: Secondary | ICD-10-CM | POA: Diagnosis not present

## 2023-11-10 DIAGNOSIS — R2689 Other abnormalities of gait and mobility: Secondary | ICD-10-CM | POA: Diagnosis not present

## 2023-11-10 DIAGNOSIS — F411 Generalized anxiety disorder: Secondary | ICD-10-CM | POA: Diagnosis not present

## 2023-11-10 DIAGNOSIS — G8929 Other chronic pain: Secondary | ICD-10-CM | POA: Diagnosis not present

## 2023-11-10 DIAGNOSIS — I10 Essential (primary) hypertension: Secondary | ICD-10-CM | POA: Diagnosis not present

## 2023-11-10 DIAGNOSIS — R1312 Dysphagia, oropharyngeal phase: Secondary | ICD-10-CM | POA: Diagnosis not present

## 2023-11-10 DIAGNOSIS — F03918 Unspecified dementia, unspecified severity, with other behavioral disturbance: Secondary | ICD-10-CM | POA: Diagnosis not present

## 2023-11-10 DIAGNOSIS — Z743 Need for continuous supervision: Secondary | ICD-10-CM | POA: Diagnosis not present

## 2023-11-10 DIAGNOSIS — R531 Weakness: Secondary | ICD-10-CM | POA: Diagnosis not present

## 2023-11-10 DIAGNOSIS — Z9189 Other specified personal risk factors, not elsewhere classified: Secondary | ICD-10-CM | POA: Diagnosis not present

## 2023-11-10 DIAGNOSIS — R4182 Altered mental status, unspecified: Secondary | ICD-10-CM | POA: Diagnosis not present

## 2023-11-10 DIAGNOSIS — F309 Manic episode, unspecified: Secondary | ICD-10-CM | POA: Diagnosis not present

## 2023-11-10 DIAGNOSIS — Z79899 Other long term (current) drug therapy: Secondary | ICD-10-CM | POA: Diagnosis not present

## 2023-11-10 DIAGNOSIS — R2681 Unsteadiness on feet: Secondary | ICD-10-CM | POA: Diagnosis not present

## 2023-11-10 DIAGNOSIS — F319 Bipolar disorder, unspecified: Secondary | ICD-10-CM | POA: Diagnosis not present

## 2023-11-10 DIAGNOSIS — Z741 Need for assistance with personal care: Secondary | ICD-10-CM | POA: Diagnosis not present

## 2023-11-10 DIAGNOSIS — R0902 Hypoxemia: Secondary | ICD-10-CM | POA: Diagnosis not present

## 2023-11-10 DIAGNOSIS — R296 Repeated falls: Secondary | ICD-10-CM | POA: Diagnosis not present

## 2023-11-10 DIAGNOSIS — G4700 Insomnia, unspecified: Secondary | ICD-10-CM | POA: Diagnosis not present

## 2023-11-10 DIAGNOSIS — J811 Chronic pulmonary edema: Secondary | ICD-10-CM | POA: Diagnosis not present

## 2023-11-10 DIAGNOSIS — F0390 Unspecified dementia without behavioral disturbance: Secondary | ICD-10-CM | POA: Diagnosis not present

## 2023-11-10 DIAGNOSIS — I69991 Dysphagia following unspecified cerebrovascular disease: Secondary | ICD-10-CM | POA: Diagnosis not present

## 2023-11-10 DIAGNOSIS — M6281 Muscle weakness (generalized): Secondary | ICD-10-CM | POA: Diagnosis not present

## 2023-11-10 DIAGNOSIS — T17920A Food in respiratory tract, part unspecified causing asphyxiation, initial encounter: Secondary | ICD-10-CM | POA: Diagnosis not present

## 2023-11-10 DIAGNOSIS — F3130 Bipolar disorder, current episode depressed, mild or moderate severity, unspecified: Secondary | ICD-10-CM | POA: Diagnosis not present

## 2023-11-10 DIAGNOSIS — J690 Pneumonitis due to inhalation of food and vomit: Secondary | ICD-10-CM | POA: Diagnosis not present

## 2023-11-10 NOTE — ED Provider Notes (Signed)
 Emergency Medicine Observation Re-evaluation Note  [name] [name] is a 78 y.o. male, seen on rounds today.  Pt initially presented to the ED for complaints of Altered Mental Status Currently, the patient is in his room waiting for breakfast.  Physical Exam  BP 106/78 (BP Location: Right Arm)   Pulse 65   Temp 97.6 F (36.4 C) (Axillary)   Resp 18   Ht 5' 11 (1.803 m)   Wt 76 kg   SpO2 98%   BMI 23.37 kg/m  Physical Exam General: Awake and alert Cardiac: Normal rate Lungs: Normal effort Psych: Appropriate mood  ED Course / MDM  EKG:   I have reviewed the labs performed to date as well as medications administered while in observation.  Recent changes in the last 24 hours include it appears patient has gotten authorization from his insurance.  Hopeful for placement..  Plan  Current plan is for placement in the coming days.SABRA Mannie Pac T, DO 11/10/23 [phone]

## 2023-11-10 NOTE — Progress Notes (Addendum)
 Patient's insurance authorization remains pending at this time.  CSW called [facility] to discuss patient as this authorization has been pending for four days. CSW spoke with Powell, clinician to discuss patient and his prior level of functioning. Powell states she will approve the authorization request.  Patient's insurance authorization has been approved, #[id number], next review date is 11/12/23.  CSW informed Tammy at [facility] of information.  CSW informed RN and MD of discharge plan.  CSW informed patient's daughter [name] of discharge plan.  Patient can go to [facility], room 114 via PTAR - RN to call when ready. The number to call for report is [phone].  Niels Portugal, MSW, LCSW Transitions of Care  Clinical Social Worker II [phone]

## 2023-11-10 NOTE — ED Notes (Signed)
 Report given to [place] RN at [facility]

## 2023-11-10 NOTE — ED Notes (Signed)
 PTAR scheduled for the patient to return to [facility].

## 2023-11-11 ENCOUNTER — Other Ambulatory Visit

## 2023-11-11 DIAGNOSIS — F319 Bipolar disorder, unspecified: Secondary | ICD-10-CM | POA: Diagnosis not present

## 2023-11-11 DIAGNOSIS — G20A1 Parkinson's disease without dyskinesia, without mention of fluctuations: Secondary | ICD-10-CM | POA: Diagnosis not present

## 2023-11-11 DIAGNOSIS — N390 Urinary tract infection, site not specified: Secondary | ICD-10-CM | POA: Diagnosis not present

## 2023-11-11 DIAGNOSIS — F0390 Unspecified dementia without behavioral disturbance: Secondary | ICD-10-CM | POA: Diagnosis not present

## 2023-11-12 DIAGNOSIS — G4700 Insomnia, unspecified: Secondary | ICD-10-CM | POA: Diagnosis not present

## 2023-11-12 DIAGNOSIS — F0390 Unspecified dementia without behavioral disturbance: Secondary | ICD-10-CM | POA: Diagnosis not present

## 2023-11-12 DIAGNOSIS — G20A1 Parkinson's disease without dyskinesia, without mention of fluctuations: Secondary | ICD-10-CM | POA: Diagnosis not present

## 2023-11-12 DIAGNOSIS — J9600 Acute respiratory failure, unspecified whether with hypoxia or hypercapnia: Secondary | ICD-10-CM | POA: Diagnosis not present

## 2023-11-13 ENCOUNTER — Telehealth: Payer: Self-pay | Admitting: Pulmonary Disease

## 2023-11-13 ENCOUNTER — Encounter: Payer: Self-pay | Admitting: Pulmonary Disease

## 2023-11-13 DIAGNOSIS — F319 Bipolar disorder, unspecified: Secondary | ICD-10-CM | POA: Diagnosis not present

## 2023-11-13 DIAGNOSIS — F0390 Unspecified dementia without behavioral disturbance: Secondary | ICD-10-CM | POA: Diagnosis not present

## 2023-11-13 DIAGNOSIS — G20A1 Parkinson's disease without dyskinesia, without mention of fluctuations: Secondary | ICD-10-CM | POA: Diagnosis not present

## 2023-11-13 DIAGNOSIS — N390 Urinary tract infection, site not specified: Secondary | ICD-10-CM | POA: Diagnosis not present

## 2023-11-13 NOTE — Telephone Encounter (Signed)
 I spoke to Patient's wife Alston). None of the Pcc's called the patient. Patient's wife Alston) informed me that they canceled CT scan because they don't want to go any further in Biopsy. They will let it ride because he has alzheimer's . FYI

## 2023-11-13 NOTE — Telephone Encounter (Signed)
 Copied from CRM [id number]. Topic: General - Other >> Nov 13, 2023 11:32 AM Rilla NOVAK wrote: Reason for CRM: Patient's wife [name] [name]) states they had a missed call.  Please call patient [phone]

## 2023-11-16 ENCOUNTER — Ambulatory Visit: Payer: Medicare PPO

## 2023-11-16 ENCOUNTER — Telehealth: Payer: Self-pay

## 2023-11-16 DIAGNOSIS — N390 Urinary tract infection, site not specified: Secondary | ICD-10-CM | POA: Diagnosis not present

## 2023-11-16 DIAGNOSIS — F319 Bipolar disorder, unspecified: Secondary | ICD-10-CM | POA: Diagnosis not present

## 2023-11-16 DIAGNOSIS — G20A1 Parkinson's disease without dyskinesia, without mention of fluctuations: Secondary | ICD-10-CM | POA: Diagnosis not present

## 2023-11-16 DIAGNOSIS — F0390 Unspecified dementia without behavioral disturbance: Secondary | ICD-10-CM | POA: Diagnosis not present

## 2023-11-16 NOTE — Telephone Encounter (Signed)
 Spoke with pt wife and he is in a assisted living and she will visit him this week and will call when she gets there so we can set up the monitor

## 2023-11-17 NOTE — Telephone Encounter (Signed)
 Noted - this is a reasonable decision. No further follow up needed.

## 2023-11-17 NOTE — Telephone Encounter (Signed)
 ATC pt's wife, [name]. Received busy signal. Will call back.

## 2023-11-18 DIAGNOSIS — F0390 Unspecified dementia without behavioral disturbance: Secondary | ICD-10-CM | POA: Diagnosis not present

## 2023-11-18 DIAGNOSIS — F319 Bipolar disorder, unspecified: Secondary | ICD-10-CM | POA: Diagnosis not present

## 2023-11-18 DIAGNOSIS — G20A1 Parkinson's disease without dyskinesia, without mention of fluctuations: Secondary | ICD-10-CM | POA: Diagnosis not present

## 2023-11-18 DIAGNOSIS — N390 Urinary tract infection, site not specified: Secondary | ICD-10-CM | POA: Diagnosis not present

## 2023-11-19 DIAGNOSIS — R1319 Other dysphagia: Secondary | ICD-10-CM | POA: Diagnosis not present

## 2023-11-19 DIAGNOSIS — I10 Essential (primary) hypertension: Secondary | ICD-10-CM | POA: Diagnosis not present

## 2023-11-19 DIAGNOSIS — G20A1 Parkinson's disease without dyskinesia, without mention of fluctuations: Secondary | ICD-10-CM | POA: Diagnosis not present

## 2023-11-19 DIAGNOSIS — E785 Hyperlipidemia, unspecified: Secondary | ICD-10-CM | POA: Diagnosis not present

## 2023-11-19 NOTE — Telephone Encounter (Signed)
 Lm for patient's spouse, [name](DPR)

## 2023-11-20 DIAGNOSIS — F309 Manic episode, unspecified: Secondary | ICD-10-CM | POA: Diagnosis not present

## 2023-11-20 DIAGNOSIS — N390 Urinary tract infection, site not specified: Secondary | ICD-10-CM | POA: Diagnosis not present

## 2023-11-20 DIAGNOSIS — J9600 Acute respiratory failure, unspecified whether with hypoxia or hypercapnia: Secondary | ICD-10-CM | POA: Diagnosis not present

## 2023-11-20 DIAGNOSIS — G20A1 Parkinson's disease without dyskinesia, without mention of fluctuations: Secondary | ICD-10-CM | POA: Diagnosis not present

## 2023-11-24 ENCOUNTER — Other Ambulatory Visit (HOSPITAL_COMMUNITY): Payer: Self-pay | Admitting: Emergency Medicine

## 2023-11-24 DIAGNOSIS — G8929 Other chronic pain: Secondary | ICD-10-CM | POA: Diagnosis not present

## 2023-11-24 DIAGNOSIS — E785 Hyperlipidemia, unspecified: Secondary | ICD-10-CM | POA: Diagnosis not present

## 2023-11-24 DIAGNOSIS — I10 Essential (primary) hypertension: Secondary | ICD-10-CM | POA: Diagnosis not present

## 2023-11-24 DIAGNOSIS — R1310 Dysphagia, unspecified: Secondary | ICD-10-CM

## 2023-11-24 DIAGNOSIS — G20A1 Parkinson's disease without dyskinesia, without mention of fluctuations: Secondary | ICD-10-CM | POA: Diagnosis not present

## 2023-11-24 DIAGNOSIS — R059 Cough, unspecified: Secondary | ICD-10-CM

## 2023-11-30 DIAGNOSIS — F3130 Bipolar disorder, current episode depressed, mild or moderate severity, unspecified: Secondary | ICD-10-CM | POA: Diagnosis not present

## 2023-11-30 DIAGNOSIS — F0280 Dementia in other diseases classified elsewhere without behavioral disturbance: Secondary | ICD-10-CM | POA: Diagnosis not present

## 2023-11-30 DIAGNOSIS — Z9189 Other specified personal risk factors, not elsewhere classified: Secondary | ICD-10-CM | POA: Diagnosis not present

## 2023-11-30 DIAGNOSIS — Z5181 Encounter for therapeutic drug level monitoring: Secondary | ICD-10-CM | POA: Diagnosis not present

## 2023-11-30 DIAGNOSIS — R296 Repeated falls: Secondary | ICD-10-CM | POA: Diagnosis not present

## 2023-11-30 DIAGNOSIS — F411 Generalized anxiety disorder: Secondary | ICD-10-CM | POA: Diagnosis not present

## 2023-11-30 DIAGNOSIS — R1312 Dysphagia, oropharyngeal phase: Secondary | ICD-10-CM | POA: Diagnosis not present

## 2023-11-30 DIAGNOSIS — G20A1 Parkinson's disease without dyskinesia, without mention of fluctuations: Secondary | ICD-10-CM | POA: Diagnosis not present

## 2023-11-30 DIAGNOSIS — I69991 Dysphagia following unspecified cerebrovascular disease: Secondary | ICD-10-CM | POA: Diagnosis not present

## 2023-12-01 DIAGNOSIS — G20A1 Parkinson's disease without dyskinesia, without mention of fluctuations: Secondary | ICD-10-CM | POA: Diagnosis not present

## 2023-12-01 DIAGNOSIS — R1319 Other dysphagia: Secondary | ICD-10-CM | POA: Diagnosis not present

## 2023-12-01 DIAGNOSIS — F319 Bipolar disorder, unspecified: Secondary | ICD-10-CM | POA: Diagnosis not present

## 2023-12-01 DIAGNOSIS — I10 Essential (primary) hypertension: Secondary | ICD-10-CM | POA: Diagnosis not present

## 2023-12-02 ENCOUNTER — Encounter (HOSPITAL_COMMUNITY): Payer: Self-pay

## 2023-12-02 ENCOUNTER — Ambulatory Visit (HOSPITAL_COMMUNITY)

## 2023-12-02 ENCOUNTER — Telehealth (HOSPITAL_COMMUNITY): Payer: Self-pay | Admitting: Emergency Medicine

## 2023-12-02 ENCOUNTER — Ambulatory Visit (HOSPITAL_COMMUNITY): Admission: RE | Admit: 2023-12-02 | Source: Ambulatory Visit

## 2023-12-02 DIAGNOSIS — R0902 Hypoxemia: Secondary | ICD-10-CM | POA: Diagnosis not present

## 2023-12-02 DIAGNOSIS — Z79899 Other long term (current) drug therapy: Secondary | ICD-10-CM | POA: Diagnosis not present

## 2023-12-02 DIAGNOSIS — J690 Pneumonitis due to inhalation of food and vomit: Secondary | ICD-10-CM | POA: Diagnosis not present

## 2023-12-02 DIAGNOSIS — R296 Repeated falls: Secondary | ICD-10-CM | POA: Diagnosis not present

## 2023-12-02 DIAGNOSIS — F3130 Bipolar disorder, current episode depressed, mild or moderate severity, unspecified: Secondary | ICD-10-CM | POA: Diagnosis not present

## 2023-12-02 DIAGNOSIS — F0280 Dementia in other diseases classified elsewhere without behavioral disturbance: Secondary | ICD-10-CM | POA: Diagnosis not present

## 2023-12-02 DIAGNOSIS — F411 Generalized anxiety disorder: Secondary | ICD-10-CM | POA: Diagnosis not present

## 2023-12-02 DIAGNOSIS — R1312 Dysphagia, oropharyngeal phase: Secondary | ICD-10-CM | POA: Diagnosis not present

## 2023-12-02 DIAGNOSIS — I69991 Dysphagia following unspecified cerebrovascular disease: Secondary | ICD-10-CM | POA: Diagnosis not present

## 2023-12-02 NOTE — Telephone Encounter (Signed)
 Per Jon at [facility]- the pt's wife did not want to move forward with the op mbss. Order has been cancelled. (ah)

## 2023-12-03 DIAGNOSIS — F0280 Dementia in other diseases classified elsewhere without behavioral disturbance: Secondary | ICD-10-CM | POA: Diagnosis not present

## 2023-12-03 DIAGNOSIS — F411 Generalized anxiety disorder: Secondary | ICD-10-CM | POA: Diagnosis not present

## 2023-12-03 DIAGNOSIS — R1312 Dysphagia, oropharyngeal phase: Secondary | ICD-10-CM | POA: Diagnosis not present

## 2023-12-03 DIAGNOSIS — R0902 Hypoxemia: Secondary | ICD-10-CM | POA: Diagnosis not present

## 2023-12-03 DIAGNOSIS — J811 Chronic pulmonary edema: Secondary | ICD-10-CM | POA: Diagnosis not present

## 2023-12-03 DIAGNOSIS — R296 Repeated falls: Secondary | ICD-10-CM | POA: Diagnosis not present

## 2023-12-03 DIAGNOSIS — J690 Pneumonitis due to inhalation of food and vomit: Secondary | ICD-10-CM | POA: Diagnosis not present

## 2023-12-03 DIAGNOSIS — I69991 Dysphagia following unspecified cerebrovascular disease: Secondary | ICD-10-CM | POA: Diagnosis not present

## 2023-12-03 DIAGNOSIS — Z79899 Other long term (current) drug therapy: Secondary | ICD-10-CM | POA: Diagnosis not present

## 2023-12-03 DIAGNOSIS — F3130 Bipolar disorder, current episode depressed, mild or moderate severity, unspecified: Secondary | ICD-10-CM | POA: Diagnosis not present

## 2023-12-04 DIAGNOSIS — G20A1 Parkinson's disease without dyskinesia, without mention of fluctuations: Secondary | ICD-10-CM | POA: Diagnosis not present

## 2023-12-04 DIAGNOSIS — I69991 Dysphagia following unspecified cerebrovascular disease: Secondary | ICD-10-CM | POA: Diagnosis not present

## 2023-12-04 DIAGNOSIS — F0390 Unspecified dementia without behavioral disturbance: Secondary | ICD-10-CM | POA: Diagnosis not present

## 2023-12-04 DIAGNOSIS — J690 Pneumonitis due to inhalation of food and vomit: Secondary | ICD-10-CM | POA: Diagnosis not present

## 2023-12-06 DIAGNOSIS — R1319 Other dysphagia: Secondary | ICD-10-CM | POA: Diagnosis not present

## 2023-12-06 DIAGNOSIS — R2689 Other abnormalities of gait and mobility: Secondary | ICD-10-CM | POA: Diagnosis not present

## 2023-12-06 DIAGNOSIS — R2681 Unsteadiness on feet: Secondary | ICD-10-CM | POA: Diagnosis not present

## 2023-12-06 DIAGNOSIS — M6281 Muscle weakness (generalized): Secondary | ICD-10-CM | POA: Diagnosis not present

## 2023-12-06 DIAGNOSIS — Z9181 History of falling: Secondary | ICD-10-CM | POA: Diagnosis not present

## 2023-12-06 DIAGNOSIS — F03918 Unspecified dementia, unspecified severity, with other behavioral disturbance: Secondary | ICD-10-CM | POA: Diagnosis not present

## 2023-12-06 DIAGNOSIS — G20A1 Parkinson's disease without dyskinesia, without mention of fluctuations: Secondary | ICD-10-CM | POA: Diagnosis not present

## 2023-12-06 DIAGNOSIS — Z741 Need for assistance with personal care: Secondary | ICD-10-CM | POA: Diagnosis not present

## 2023-12-06 DIAGNOSIS — F0390 Unspecified dementia without behavioral disturbance: Secondary | ICD-10-CM | POA: Diagnosis not present

## 2023-12-07 DIAGNOSIS — R2681 Unsteadiness on feet: Secondary | ICD-10-CM | POA: Diagnosis not present

## 2023-12-07 DIAGNOSIS — I69991 Dysphagia following unspecified cerebrovascular disease: Secondary | ICD-10-CM | POA: Diagnosis not present

## 2023-12-07 DIAGNOSIS — F411 Generalized anxiety disorder: Secondary | ICD-10-CM | POA: Diagnosis not present

## 2023-12-07 DIAGNOSIS — Z741 Need for assistance with personal care: Secondary | ICD-10-CM | POA: Diagnosis not present

## 2023-12-07 DIAGNOSIS — F03918 Unspecified dementia, unspecified severity, with other behavioral disturbance: Secondary | ICD-10-CM | POA: Diagnosis not present

## 2023-12-07 DIAGNOSIS — J690 Pneumonitis due to inhalation of food and vomit: Secondary | ICD-10-CM | POA: Diagnosis not present

## 2023-12-07 DIAGNOSIS — F0390 Unspecified dementia without behavioral disturbance: Secondary | ICD-10-CM | POA: Diagnosis not present

## 2023-12-07 DIAGNOSIS — G20A1 Parkinson's disease without dyskinesia, without mention of fluctuations: Secondary | ICD-10-CM | POA: Diagnosis not present

## 2023-12-07 DIAGNOSIS — R296 Repeated falls: Secondary | ICD-10-CM | POA: Diagnosis not present

## 2023-12-07 DIAGNOSIS — R1319 Other dysphagia: Secondary | ICD-10-CM | POA: Diagnosis not present

## 2023-12-07 DIAGNOSIS — Z9181 History of falling: Secondary | ICD-10-CM | POA: Diagnosis not present

## 2023-12-07 DIAGNOSIS — R2689 Other abnormalities of gait and mobility: Secondary | ICD-10-CM | POA: Diagnosis not present

## 2023-12-07 DIAGNOSIS — F3130 Bipolar disorder, current episode depressed, mild or moderate severity, unspecified: Secondary | ICD-10-CM | POA: Diagnosis not present

## 2023-12-07 DIAGNOSIS — R1312 Dysphagia, oropharyngeal phase: Secondary | ICD-10-CM | POA: Diagnosis not present

## 2023-12-07 DIAGNOSIS — Z681 Body mass index (BMI) 19 or less, adult: Secondary | ICD-10-CM | POA: Diagnosis not present

## 2023-12-08 ENCOUNTER — Ambulatory Visit: Payer: Medicare PPO

## 2023-12-08 DIAGNOSIS — Z741 Need for assistance with personal care: Secondary | ICD-10-CM | POA: Diagnosis not present

## 2023-12-08 DIAGNOSIS — G20A1 Parkinson's disease without dyskinesia, without mention of fluctuations: Secondary | ICD-10-CM | POA: Diagnosis not present

## 2023-12-08 DIAGNOSIS — R2681 Unsteadiness on feet: Secondary | ICD-10-CM | POA: Diagnosis not present

## 2023-12-08 DIAGNOSIS — I10 Essential (primary) hypertension: Secondary | ICD-10-CM | POA: Diagnosis not present

## 2023-12-08 DIAGNOSIS — F03918 Unspecified dementia, unspecified severity, with other behavioral disturbance: Secondary | ICD-10-CM | POA: Diagnosis not present

## 2023-12-08 DIAGNOSIS — R1319 Other dysphagia: Secondary | ICD-10-CM | POA: Diagnosis not present

## 2023-12-08 DIAGNOSIS — M6281 Muscle weakness (generalized): Secondary | ICD-10-CM | POA: Diagnosis not present

## 2023-12-08 DIAGNOSIS — F419 Anxiety disorder, unspecified: Secondary | ICD-10-CM | POA: Diagnosis not present

## 2023-12-08 DIAGNOSIS — I482 Chronic atrial fibrillation, unspecified: Secondary | ICD-10-CM | POA: Diagnosis not present

## 2023-12-08 DIAGNOSIS — F0390 Unspecified dementia without behavioral disturbance: Secondary | ICD-10-CM | POA: Diagnosis not present

## 2023-12-08 DIAGNOSIS — Z9181 History of falling: Secondary | ICD-10-CM | POA: Diagnosis not present

## 2023-12-08 DIAGNOSIS — R2689 Other abnormalities of gait and mobility: Secondary | ICD-10-CM | POA: Diagnosis not present

## 2023-12-09 DIAGNOSIS — Z741 Need for assistance with personal care: Secondary | ICD-10-CM | POA: Diagnosis not present

## 2023-12-09 DIAGNOSIS — F411 Generalized anxiety disorder: Secondary | ICD-10-CM | POA: Diagnosis not present

## 2023-12-09 DIAGNOSIS — J690 Pneumonitis due to inhalation of food and vomit: Secondary | ICD-10-CM | POA: Diagnosis not present

## 2023-12-09 DIAGNOSIS — R2681 Unsteadiness on feet: Secondary | ICD-10-CM | POA: Diagnosis not present

## 2023-12-09 DIAGNOSIS — F0390 Unspecified dementia without behavioral disturbance: Secondary | ICD-10-CM | POA: Diagnosis not present

## 2023-12-09 DIAGNOSIS — Z9181 History of falling: Secondary | ICD-10-CM | POA: Diagnosis not present

## 2023-12-09 DIAGNOSIS — F03918 Unspecified dementia, unspecified severity, with other behavioral disturbance: Secondary | ICD-10-CM | POA: Diagnosis not present

## 2023-12-09 DIAGNOSIS — F3130 Bipolar disorder, current episode depressed, mild or moderate severity, unspecified: Secondary | ICD-10-CM | POA: Diagnosis not present

## 2023-12-09 DIAGNOSIS — G20A1 Parkinson's disease without dyskinesia, without mention of fluctuations: Secondary | ICD-10-CM | POA: Diagnosis not present

## 2023-12-09 DIAGNOSIS — R2689 Other abnormalities of gait and mobility: Secondary | ICD-10-CM | POA: Diagnosis not present

## 2023-12-09 DIAGNOSIS — I69991 Dysphagia following unspecified cerebrovascular disease: Secondary | ICD-10-CM | POA: Diagnosis not present

## 2023-12-09 DIAGNOSIS — R1319 Other dysphagia: Secondary | ICD-10-CM | POA: Diagnosis not present

## 2023-12-09 DIAGNOSIS — F0280 Dementia in other diseases classified elsewhere without behavioral disturbance: Secondary | ICD-10-CM | POA: Diagnosis not present

## 2023-12-09 DIAGNOSIS — R1312 Dysphagia, oropharyngeal phase: Secondary | ICD-10-CM | POA: Diagnosis not present

## 2023-12-09 DIAGNOSIS — Z681 Body mass index (BMI) 19 or less, adult: Secondary | ICD-10-CM | POA: Diagnosis not present

## 2023-12-09 DIAGNOSIS — R296 Repeated falls: Secondary | ICD-10-CM | POA: Diagnosis not present

## 2023-12-09 LAB — CUP PACEART REMOTE DEVICE CHECK
Battery Remaining Longevity: 180 mo
Battery Voltage: 3.19 V
Brady Statistic RV Percent Paced: 15.61 %
Date Time Interrogation Session: 20251007040949
Implantable Lead Connection Status: 753985
Implantable Lead Implant Date: 20250221
Implantable Lead Location: 753860
Implantable Lead Model: 5076
Implantable Pulse Generator Implant Date: 20250221
Lead Channel Impedance Value: 380 Ohm
Lead Channel Impedance Value: 589 Ohm
Lead Channel Pacing Threshold Amplitude: 0.75 V
Lead Channel Pacing Threshold Pulse Width: 0.4 ms
Lead Channel Sensing Intrinsic Amplitude: 13.75 mV
Lead Channel Sensing Intrinsic Amplitude: 13.75 mV
Lead Channel Setting Pacing Amplitude: 2 V
Lead Channel Setting Pacing Pulse Width: 0.4 ms
Lead Channel Setting Sensing Sensitivity: 1.2 mV
Zone Setting Status: 755011

## 2023-12-10 DIAGNOSIS — R2681 Unsteadiness on feet: Secondary | ICD-10-CM | POA: Diagnosis not present

## 2023-12-10 DIAGNOSIS — R2689 Other abnormalities of gait and mobility: Secondary | ICD-10-CM | POA: Diagnosis not present

## 2023-12-10 DIAGNOSIS — Z741 Need for assistance with personal care: Secondary | ICD-10-CM | POA: Diagnosis not present

## 2023-12-10 DIAGNOSIS — F03918 Unspecified dementia, unspecified severity, with other behavioral disturbance: Secondary | ICD-10-CM | POA: Diagnosis not present

## 2023-12-10 DIAGNOSIS — G20A1 Parkinson's disease without dyskinesia, without mention of fluctuations: Secondary | ICD-10-CM | POA: Diagnosis not present

## 2023-12-10 DIAGNOSIS — F0390 Unspecified dementia without behavioral disturbance: Secondary | ICD-10-CM | POA: Diagnosis not present

## 2023-12-10 DIAGNOSIS — R1319 Other dysphagia: Secondary | ICD-10-CM | POA: Diagnosis not present

## 2023-12-10 DIAGNOSIS — Z9181 History of falling: Secondary | ICD-10-CM | POA: Diagnosis not present

## 2023-12-11 ENCOUNTER — Ambulatory Visit: Payer: Self-pay | Admitting: Cardiology

## 2023-12-11 DIAGNOSIS — R2689 Other abnormalities of gait and mobility: Secondary | ICD-10-CM | POA: Diagnosis not present

## 2023-12-11 DIAGNOSIS — G20A1 Parkinson's disease without dyskinesia, without mention of fluctuations: Secondary | ICD-10-CM | POA: Diagnosis not present

## 2023-12-11 DIAGNOSIS — Z9181 History of falling: Secondary | ICD-10-CM | POA: Diagnosis not present

## 2023-12-11 DIAGNOSIS — F0390 Unspecified dementia without behavioral disturbance: Secondary | ICD-10-CM | POA: Diagnosis not present

## 2023-12-11 DIAGNOSIS — F03918 Unspecified dementia, unspecified severity, with other behavioral disturbance: Secondary | ICD-10-CM | POA: Diagnosis not present

## 2023-12-11 DIAGNOSIS — R1319 Other dysphagia: Secondary | ICD-10-CM | POA: Diagnosis not present

## 2023-12-11 DIAGNOSIS — Z741 Need for assistance with personal care: Secondary | ICD-10-CM | POA: Diagnosis not present

## 2023-12-11 DIAGNOSIS — R2681 Unsteadiness on feet: Secondary | ICD-10-CM | POA: Diagnosis not present

## 2023-12-11 NOTE — Progress Notes (Signed)
 Remote PPM Transmission

## 2023-12-12 DIAGNOSIS — F0390 Unspecified dementia without behavioral disturbance: Secondary | ICD-10-CM | POA: Diagnosis not present

## 2023-12-12 DIAGNOSIS — R2689 Other abnormalities of gait and mobility: Secondary | ICD-10-CM | POA: Diagnosis not present

## 2023-12-12 DIAGNOSIS — R2681 Unsteadiness on feet: Secondary | ICD-10-CM | POA: Diagnosis not present

## 2023-12-12 DIAGNOSIS — Z741 Need for assistance with personal care: Secondary | ICD-10-CM | POA: Diagnosis not present

## 2023-12-12 DIAGNOSIS — G20A1 Parkinson's disease without dyskinesia, without mention of fluctuations: Secondary | ICD-10-CM | POA: Diagnosis not present

## 2023-12-12 DIAGNOSIS — Z9181 History of falling: Secondary | ICD-10-CM | POA: Diagnosis not present

## 2023-12-12 DIAGNOSIS — R1319 Other dysphagia: Secondary | ICD-10-CM | POA: Diagnosis not present

## 2023-12-12 DIAGNOSIS — F03918 Unspecified dementia, unspecified severity, with other behavioral disturbance: Secondary | ICD-10-CM | POA: Diagnosis not present

## 2023-12-13 DIAGNOSIS — G20A1 Parkinson's disease without dyskinesia, without mention of fluctuations: Secondary | ICD-10-CM | POA: Diagnosis not present

## 2023-12-13 DIAGNOSIS — Z741 Need for assistance with personal care: Secondary | ICD-10-CM | POA: Diagnosis not present

## 2023-12-13 DIAGNOSIS — F03918 Unspecified dementia, unspecified severity, with other behavioral disturbance: Secondary | ICD-10-CM | POA: Diagnosis not present

## 2023-12-13 DIAGNOSIS — R2689 Other abnormalities of gait and mobility: Secondary | ICD-10-CM | POA: Diagnosis not present

## 2023-12-13 DIAGNOSIS — R1319 Other dysphagia: Secondary | ICD-10-CM | POA: Diagnosis not present

## 2023-12-13 DIAGNOSIS — R2681 Unsteadiness on feet: Secondary | ICD-10-CM | POA: Diagnosis not present

## 2023-12-13 DIAGNOSIS — Z9181 History of falling: Secondary | ICD-10-CM | POA: Diagnosis not present

## 2023-12-13 DIAGNOSIS — M6281 Muscle weakness (generalized): Secondary | ICD-10-CM | POA: Diagnosis not present

## 2023-12-13 DIAGNOSIS — F0390 Unspecified dementia without behavioral disturbance: Secondary | ICD-10-CM | POA: Diagnosis not present

## 2023-12-14 DIAGNOSIS — Z9181 History of falling: Secondary | ICD-10-CM | POA: Diagnosis not present

## 2023-12-14 DIAGNOSIS — F03918 Unspecified dementia, unspecified severity, with other behavioral disturbance: Secondary | ICD-10-CM | POA: Diagnosis not present

## 2023-12-14 DIAGNOSIS — F0390 Unspecified dementia without behavioral disturbance: Secondary | ICD-10-CM | POA: Diagnosis not present

## 2023-12-14 DIAGNOSIS — G20A1 Parkinson's disease without dyskinesia, without mention of fluctuations: Secondary | ICD-10-CM | POA: Diagnosis not present

## 2023-12-14 DIAGNOSIS — R2689 Other abnormalities of gait and mobility: Secondary | ICD-10-CM | POA: Diagnosis not present

## 2023-12-14 DIAGNOSIS — R1319 Other dysphagia: Secondary | ICD-10-CM | POA: Diagnosis not present

## 2023-12-14 DIAGNOSIS — R2681 Unsteadiness on feet: Secondary | ICD-10-CM | POA: Diagnosis not present

## 2023-12-14 DIAGNOSIS — Z741 Need for assistance with personal care: Secondary | ICD-10-CM | POA: Diagnosis not present

## 2023-12-15 DIAGNOSIS — F411 Generalized anxiety disorder: Secondary | ICD-10-CM | POA: Diagnosis not present

## 2023-12-15 DIAGNOSIS — F0280 Dementia in other diseases classified elsewhere without behavioral disturbance: Secondary | ICD-10-CM | POA: Diagnosis not present

## 2023-12-15 DIAGNOSIS — F03918 Unspecified dementia, unspecified severity, with other behavioral disturbance: Secondary | ICD-10-CM | POA: Diagnosis not present

## 2023-12-15 DIAGNOSIS — Z741 Need for assistance with personal care: Secondary | ICD-10-CM | POA: Diagnosis not present

## 2023-12-15 DIAGNOSIS — Z681 Body mass index (BMI) 19 or less, adult: Secondary | ICD-10-CM | POA: Diagnosis not present

## 2023-12-15 DIAGNOSIS — F0390 Unspecified dementia without behavioral disturbance: Secondary | ICD-10-CM | POA: Diagnosis not present

## 2023-12-15 DIAGNOSIS — G20A1 Parkinson's disease without dyskinesia, without mention of fluctuations: Secondary | ICD-10-CM | POA: Diagnosis not present

## 2023-12-15 DIAGNOSIS — J690 Pneumonitis due to inhalation of food and vomit: Secondary | ICD-10-CM | POA: Diagnosis not present

## 2023-12-15 DIAGNOSIS — R296 Repeated falls: Secondary | ICD-10-CM | POA: Diagnosis not present

## 2023-12-15 DIAGNOSIS — R1312 Dysphagia, oropharyngeal phase: Secondary | ICD-10-CM | POA: Diagnosis not present

## 2023-12-15 DIAGNOSIS — R1319 Other dysphagia: Secondary | ICD-10-CM | POA: Diagnosis not present

## 2023-12-15 DIAGNOSIS — R2689 Other abnormalities of gait and mobility: Secondary | ICD-10-CM | POA: Diagnosis not present

## 2023-12-15 DIAGNOSIS — F3130 Bipolar disorder, current episode depressed, mild or moderate severity, unspecified: Secondary | ICD-10-CM | POA: Diagnosis not present

## 2023-12-15 DIAGNOSIS — Z9181 History of falling: Secondary | ICD-10-CM | POA: Diagnosis not present

## 2023-12-15 DIAGNOSIS — R2681 Unsteadiness on feet: Secondary | ICD-10-CM | POA: Diagnosis not present

## 2023-12-15 DIAGNOSIS — I69991 Dysphagia following unspecified cerebrovascular disease: Secondary | ICD-10-CM | POA: Diagnosis not present

## 2023-12-16 ENCOUNTER — Other Ambulatory Visit

## 2023-12-16 DIAGNOSIS — G20A1 Parkinson's disease without dyskinesia, without mention of fluctuations: Secondary | ICD-10-CM | POA: Diagnosis not present

## 2023-12-16 DIAGNOSIS — F419 Anxiety disorder, unspecified: Secondary | ICD-10-CM | POA: Diagnosis not present

## 2023-12-16 DIAGNOSIS — F03918 Unspecified dementia, unspecified severity, with other behavioral disturbance: Secondary | ICD-10-CM | POA: Diagnosis not present

## 2023-12-16 DIAGNOSIS — R2681 Unsteadiness on feet: Secondary | ICD-10-CM | POA: Diagnosis not present

## 2023-12-16 DIAGNOSIS — Z9181 History of falling: Secondary | ICD-10-CM | POA: Diagnosis not present

## 2023-12-16 DIAGNOSIS — R2689 Other abnormalities of gait and mobility: Secondary | ICD-10-CM | POA: Diagnosis not present

## 2023-12-16 DIAGNOSIS — R1319 Other dysphagia: Secondary | ICD-10-CM | POA: Diagnosis not present

## 2023-12-16 DIAGNOSIS — F0390 Unspecified dementia without behavioral disturbance: Secondary | ICD-10-CM | POA: Diagnosis not present

## 2023-12-16 DIAGNOSIS — F319 Bipolar disorder, unspecified: Secondary | ICD-10-CM | POA: Diagnosis not present

## 2023-12-16 DIAGNOSIS — Z741 Need for assistance with personal care: Secondary | ICD-10-CM | POA: Diagnosis not present

## 2023-12-16 DIAGNOSIS — G4700 Insomnia, unspecified: Secondary | ICD-10-CM | POA: Diagnosis not present

## 2023-12-17 DIAGNOSIS — F0390 Unspecified dementia without behavioral disturbance: Secondary | ICD-10-CM | POA: Diagnosis not present

## 2023-12-17 DIAGNOSIS — R1319 Other dysphagia: Secondary | ICD-10-CM | POA: Diagnosis not present

## 2023-12-17 DIAGNOSIS — R2689 Other abnormalities of gait and mobility: Secondary | ICD-10-CM | POA: Diagnosis not present

## 2023-12-17 DIAGNOSIS — Z9181 History of falling: Secondary | ICD-10-CM | POA: Diagnosis not present

## 2023-12-17 DIAGNOSIS — F03918 Unspecified dementia, unspecified severity, with other behavioral disturbance: Secondary | ICD-10-CM | POA: Diagnosis not present

## 2023-12-17 DIAGNOSIS — Z741 Need for assistance with personal care: Secondary | ICD-10-CM | POA: Diagnosis not present

## 2023-12-17 DIAGNOSIS — G20A1 Parkinson's disease without dyskinesia, without mention of fluctuations: Secondary | ICD-10-CM | POA: Diagnosis not present

## 2023-12-17 DIAGNOSIS — R2681 Unsteadiness on feet: Secondary | ICD-10-CM | POA: Diagnosis not present

## 2023-12-21 ENCOUNTER — Ambulatory Visit: Payer: Medicare PPO

## 2023-12-24 ENCOUNTER — Emergency Department (HOSPITAL_COMMUNITY)
Admission: EM | Admit: 2023-12-24 | Discharge: 2023-12-25 | Disposition: A | Discharge status: Skilled Nursing Facility | Source: Home / Self Care | Attending: Emergency Medicine | Admitting: Emergency Medicine

## 2023-12-24 ENCOUNTER — Encounter (HOSPITAL_COMMUNITY): Payer: Self-pay

## 2023-12-24 ENCOUNTER — Other Ambulatory Visit: Payer: Self-pay

## 2023-12-24 ENCOUNTER — Emergency Department (HOSPITAL_COMMUNITY)

## 2023-12-24 DIAGNOSIS — G309 Alzheimer's disease, unspecified: Secondary | ICD-10-CM | POA: Diagnosis present

## 2023-12-24 DIAGNOSIS — R4182 Altered mental status, unspecified: Secondary | ICD-10-CM | POA: Diagnosis not present

## 2023-12-24 DIAGNOSIS — R531 Weakness: Secondary | ICD-10-CM | POA: Diagnosis not present

## 2023-12-24 DIAGNOSIS — I2510 Atherosclerotic heart disease of native coronary artery without angina pectoris: Secondary | ICD-10-CM | POA: Insufficient documentation

## 2023-12-24 DIAGNOSIS — J189 Pneumonia, unspecified organism: Secondary | ICD-10-CM | POA: Diagnosis present

## 2023-12-24 DIAGNOSIS — R1310 Dysphagia, unspecified: Secondary | ICD-10-CM | POA: Diagnosis not present

## 2023-12-24 DIAGNOSIS — D649 Anemia, unspecified: Secondary | ICD-10-CM | POA: Diagnosis present

## 2023-12-24 DIAGNOSIS — F0283 Dementia in other diseases classified elsewhere, unspecified severity, with mood disturbance: Secondary | ICD-10-CM | POA: Diagnosis present

## 2023-12-24 DIAGNOSIS — Z96641 Presence of right artificial hip joint: Secondary | ICD-10-CM | POA: Insufficient documentation

## 2023-12-24 DIAGNOSIS — Z23 Encounter for immunization: Secondary | ICD-10-CM | POA: Diagnosis present

## 2023-12-24 DIAGNOSIS — S01119A Laceration without foreign body of unspecified eyelid and periocular area, initial encounter: Secondary | ICD-10-CM | POA: Diagnosis present

## 2023-12-24 DIAGNOSIS — S01112A Laceration without foreign body of left eyelid and periocular area, initial encounter: Secondary | ICD-10-CM | POA: Insufficient documentation

## 2023-12-24 DIAGNOSIS — I4891 Unspecified atrial fibrillation: Secondary | ICD-10-CM | POA: Insufficient documentation

## 2023-12-24 DIAGNOSIS — S199XXA Unspecified injury of neck, initial encounter: Secondary | ICD-10-CM | POA: Diagnosis not present

## 2023-12-24 DIAGNOSIS — I10 Essential (primary) hypertension: Secondary | ICD-10-CM | POA: Insufficient documentation

## 2023-12-24 DIAGNOSIS — Z7901 Long term (current) use of anticoagulants: Secondary | ICD-10-CM | POA: Insufficient documentation

## 2023-12-24 DIAGNOSIS — I442 Atrioventricular block, complete: Secondary | ICD-10-CM | POA: Diagnosis present

## 2023-12-24 DIAGNOSIS — R6520 Severe sepsis without septic shock: Secondary | ICD-10-CM | POA: Diagnosis present

## 2023-12-24 DIAGNOSIS — G9340 Encephalopathy, unspecified: Secondary | ICD-10-CM | POA: Diagnosis present

## 2023-12-24 DIAGNOSIS — S0181XA Laceration without foreign body of other part of head, initial encounter: Secondary | ICD-10-CM

## 2023-12-24 DIAGNOSIS — J690 Pneumonitis due to inhalation of food and vomit: Secondary | ICD-10-CM | POA: Diagnosis not present

## 2023-12-24 DIAGNOSIS — E872 Acidosis, unspecified: Secondary | ICD-10-CM | POA: Diagnosis present

## 2023-12-24 DIAGNOSIS — M47816 Spondylosis without myelopathy or radiculopathy, lumbar region: Secondary | ICD-10-CM | POA: Diagnosis not present

## 2023-12-24 DIAGNOSIS — Z87891 Personal history of nicotine dependence: Secondary | ICD-10-CM | POA: Insufficient documentation

## 2023-12-24 DIAGNOSIS — N281 Cyst of kidney, acquired: Secondary | ICD-10-CM | POA: Diagnosis not present

## 2023-12-24 DIAGNOSIS — I69991 Dysphagia following unspecified cerebrovascular disease: Secondary | ICD-10-CM | POA: Diagnosis not present

## 2023-12-24 DIAGNOSIS — Z950 Presence of cardiac pacemaker: Secondary | ICD-10-CM | POA: Diagnosis not present

## 2023-12-24 DIAGNOSIS — I482 Chronic atrial fibrillation, unspecified: Secondary | ICD-10-CM | POA: Diagnosis present

## 2023-12-24 DIAGNOSIS — I25810 Atherosclerosis of coronary artery bypass graft(s) without angina pectoris: Secondary | ICD-10-CM | POA: Diagnosis present

## 2023-12-24 DIAGNOSIS — Z66 Do not resuscitate: Secondary | ICD-10-CM | POA: Diagnosis present

## 2023-12-24 DIAGNOSIS — R000 Tachycardia, unspecified: Secondary | ICD-10-CM | POA: Diagnosis not present

## 2023-12-24 DIAGNOSIS — Z79899 Other long term (current) drug therapy: Secondary | ICD-10-CM | POA: Insufficient documentation

## 2023-12-24 DIAGNOSIS — J188 Other pneumonia, unspecified organism: Secondary | ICD-10-CM | POA: Diagnosis not present

## 2023-12-24 DIAGNOSIS — F3130 Bipolar disorder, current episode depressed, mild or moderate severity, unspecified: Secondary | ICD-10-CM | POA: Diagnosis not present

## 2023-12-24 DIAGNOSIS — S0532XA Ocular laceration without prolapse or loss of intraocular tissue, left eye, initial encounter: Secondary | ICD-10-CM | POA: Diagnosis present

## 2023-12-24 DIAGNOSIS — R29818 Other symptoms and signs involving the nervous system: Secondary | ICD-10-CM | POA: Diagnosis not present

## 2023-12-24 DIAGNOSIS — F0150 Vascular dementia without behavioral disturbance: Secondary | ICD-10-CM | POA: Insufficient documentation

## 2023-12-24 DIAGNOSIS — J90 Pleural effusion, not elsewhere classified: Secondary | ICD-10-CM | POA: Diagnosis not present

## 2023-12-24 DIAGNOSIS — Z8659 Personal history of other mental and behavioral disorders: Secondary | ICD-10-CM | POA: Diagnosis not present

## 2023-12-24 DIAGNOSIS — R270 Ataxia, unspecified: Secondary | ICD-10-CM | POA: Diagnosis not present

## 2023-12-24 DIAGNOSIS — A419 Sepsis, unspecified organism: Secondary | ICD-10-CM | POA: Diagnosis present

## 2023-12-24 DIAGNOSIS — I495 Sick sinus syndrome: Secondary | ICD-10-CM | POA: Diagnosis present

## 2023-12-24 DIAGNOSIS — R296 Repeated falls: Secondary | ICD-10-CM | POA: Diagnosis not present

## 2023-12-24 DIAGNOSIS — Z6822 Body mass index (BMI) 22.0-22.9, adult: Secondary | ICD-10-CM | POA: Diagnosis not present

## 2023-12-24 DIAGNOSIS — Z043 Encounter for examination and observation following other accident: Secondary | ICD-10-CM | POA: Diagnosis not present

## 2023-12-24 DIAGNOSIS — R918 Other nonspecific abnormal finding of lung field: Secondary | ICD-10-CM | POA: Diagnosis not present

## 2023-12-24 DIAGNOSIS — I517 Cardiomegaly: Secondary | ICD-10-CM | POA: Diagnosis not present

## 2023-12-24 DIAGNOSIS — M1612 Unilateral primary osteoarthritis, left hip: Secondary | ICD-10-CM | POA: Diagnosis not present

## 2023-12-24 DIAGNOSIS — W19XXXA Unspecified fall, initial encounter: Secondary | ICD-10-CM | POA: Diagnosis present

## 2023-12-24 DIAGNOSIS — S0012XA Contusion of left eyelid and periocular area, initial encounter: Secondary | ICD-10-CM | POA: Diagnosis not present

## 2023-12-24 DIAGNOSIS — S0012XD Contusion of left eyelid and periocular area, subsequent encounter: Secondary | ICD-10-CM | POA: Diagnosis not present

## 2023-12-24 DIAGNOSIS — E785 Hyperlipidemia, unspecified: Secondary | ICD-10-CM | POA: Diagnosis present

## 2023-12-24 DIAGNOSIS — G9341 Metabolic encephalopathy: Secondary | ICD-10-CM | POA: Diagnosis present

## 2023-12-24 DIAGNOSIS — S0990XA Unspecified injury of head, initial encounter: Secondary | ICD-10-CM | POA: Diagnosis not present

## 2023-12-24 DIAGNOSIS — F02818 Dementia in other diseases classified elsewhere, unspecified severity, with other behavioral disturbance: Secondary | ICD-10-CM | POA: Diagnosis not present

## 2023-12-24 DIAGNOSIS — F0280 Dementia in other diseases classified elsewhere without behavioral disturbance: Secondary | ICD-10-CM | POA: Diagnosis not present

## 2023-12-24 DIAGNOSIS — Z1152 Encounter for screening for COVID-19: Secondary | ICD-10-CM | POA: Diagnosis not present

## 2023-12-24 DIAGNOSIS — F0153 Vascular dementia, unspecified severity, with mood disturbance: Secondary | ICD-10-CM | POA: Diagnosis present

## 2023-12-24 DIAGNOSIS — R0989 Other specified symptoms and signs involving the circulatory and respiratory systems: Secondary | ICD-10-CM | POA: Diagnosis not present

## 2023-12-24 DIAGNOSIS — G20A1 Parkinson's disease without dyskinesia, without mention of fluctuations: Secondary | ICD-10-CM | POA: Diagnosis present

## 2023-12-24 DIAGNOSIS — G2581 Restless legs syndrome: Secondary | ICD-10-CM | POA: Diagnosis present

## 2023-12-24 DIAGNOSIS — Z515 Encounter for palliative care: Secondary | ICD-10-CM | POA: Diagnosis not present

## 2023-12-24 DIAGNOSIS — F319 Bipolar disorder, unspecified: Secondary | ICD-10-CM | POA: Diagnosis present

## 2023-12-24 LAB — CBC WITH DIFFERENTIAL/PLATELET
Abs Immature Granulocytes: 0.02 K/uL (ref 0.00–0.07)
Basophils Absolute: 0 K/uL (ref 0.0–0.1)
Basophils Relative: 1 %
Eosinophils Absolute: 0.4 K/uL (ref 0.0–0.5)
Eosinophils Relative: 7 %
HCT: 31.1 % — ABNORMAL LOW (ref 39.0–52.0)
Hemoglobin: 9.5 g/dL — ABNORMAL LOW (ref 13.0–17.0)
Immature Granulocytes: 0 %
Lymphocytes Relative: 20 %
Lymphs Abs: 1.2 K/uL (ref 0.7–4.0)
MCH: 30.4 pg (ref 26.0–34.0)
MCHC: 30.5 g/dL (ref 30.0–36.0)
MCV: 99.4 fL (ref 80.0–100.0)
Monocytes Absolute: 0.3 K/uL (ref 0.1–1.0)
Monocytes Relative: 5 %
Neutro Abs: 3.8 K/uL (ref 1.7–7.7)
Neutrophils Relative %: 67 %
Platelets: 113 K/uL — ABNORMAL LOW (ref 150–400)
RBC: 3.13 MIL/uL — ABNORMAL LOW (ref 4.22–5.81)
RDW: 16 % — ABNORMAL HIGH (ref 11.5–15.5)
WBC: 5.6 K/uL (ref 4.0–10.5)
nRBC: 0 % (ref 0.0–0.2)

## 2023-12-24 LAB — COMPREHENSIVE METABOLIC PANEL WITH GFR
ALT: 6 U/L (ref 0–44)
AST: 21 U/L (ref 15–41)
Albumin: 3.1 g/dL — ABNORMAL LOW (ref 3.5–5.0)
Alkaline Phosphatase: 92 U/L (ref 38–126)
Anion gap: 8 (ref 5–15)
BUN: 16 mg/dL (ref 8–23)
CO2: 30 mmol/L (ref 22–32)
Calcium: 9.5 mg/dL (ref 8.9–10.3)
Chloride: 101 mmol/L (ref 98–111)
Creatinine, Ser: 1.2 mg/dL (ref 0.61–1.24)
GFR, Estimated: >60 mL/min (ref >60)
Glucose, Bld: 91 mg/dL (ref 70–99)
Potassium: 4.4 mmol/L (ref 3.5–5.1)
Sodium: 139 mmol/L (ref 135–145)
Total Bilirubin: 0.4 mg/dL (ref 0.0–1.2)
Total Protein: 6.3 g/dL — ABNORMAL LOW (ref 6.5–8.1)

## 2023-12-24 LAB — CK: Total CK: 64 U/L (ref 49–397)

## 2023-12-24 MED ORDER — QUETIAPINE FUMARATE 25 MG PO TABS
50.0000 mg | ORAL_TABLET | Freq: Once | ORAL | Status: AC
  Administered 2023-12-24: 50 mg via ORAL
  Filled 2023-12-24: qty 2

## 2023-12-24 MED ORDER — LACTATED RINGERS IV BOLUS
1000.0000 mL | Freq: Once | INTRAVENOUS | Status: AC
  Administered 2023-12-24: 1000 mL via INTRAVENOUS

## 2023-12-24 MED ORDER — TETANUS-DIPHTH-ACELL PERTUSSIS 5-2-15.5 LF-MCG/0.5 IM SUSP
0.5000 mL | Freq: Once | INTRAMUSCULAR | Status: AC
Start: 2023-12-24 — End: 2023-12-24
  Administered 2023-12-24: 0.5 mL via INTRAMUSCULAR
  Filled 2023-12-24: qty 0.5

## 2023-12-24 NOTE — ED Notes (Signed)
 Wife [name] called 2x would like an update  [phone]

## 2023-12-24 NOTE — ED Notes (Signed)
 Attempted to call facility to let them know pt is coming back.

## 2023-12-24 NOTE — ED Notes (Signed)
 Patient cleaned and changed into clean brief, linen changed. Pt trying to get out of bed, room close to nursing desk, door open, posey in place, and fall mat beside bed.

## 2023-12-24 NOTE — ED Triage Notes (Signed)
 Pt had unwitnessed fall at [facility]. Pt has laceration to left side of forehead. Pt alert and oriented x 2 at baseline.

## 2023-12-24 NOTE — ED Notes (Signed)
 PT attempting to slide out of bed.  PT assisted back up into bed.  Bed alarm is on.

## 2023-12-24 NOTE — Discharge Instructions (Addendum)
 While [name] [name] was in the emergency room, he had blood work done that was normal.  He had scans of his head, neck, chest and pelvis that were negative.  He had a small cut closed with some glue over his left eyebrow.

## 2023-12-24 NOTE — Progress Notes (Signed)
 Orthopedic Tech Progress Note Patient Details:  [name] [name] 1945-07-30 [id number]  Level II trauma, no ortho tech orders at this time.  Patient ID: [name] [name], male   DOB: 1946-01-20, 78 y.o.   MRN: [id number]  [name] 12/24/2023, 5:53 PM

## 2023-12-24 NOTE — ED Notes (Signed)
 Phlebotomy at bedside.

## 2023-12-24 NOTE — ED Notes (Signed)
 PTAR called for transport back to [facility]

## 2023-12-24 NOTE — ED Notes (Incomplete)
 Trauma Response Nurse Documentation   [name] [name] is a 78 y.o. male arriving to [name]  On No antithrombotic. Trauma was activated as a Level 2 by Charge RN based on the following trauma criteria GCS 10-14 associated with trauma or AVPU < A.  Patient cleared for CT by [name] [name]. Pt transported to CT with trauma response nurse present to monitor. RN remained with the patient throughout their absence from the department for clinical observation.   GCS 14 -- baseline.    History   Past Medical History:  Diagnosis Date   Alzheimer disease (HCC)    Anxiety    Arthritis    Atrial fibrillation (HCC)    Chronic   Bipolar disorder (HCC)    Chronic low back pain    Dementia (HCC)    Depression    Dizziness    Dysrhythmia    a fib   [name] (erectile dysfunction)    External hemorrhoids    Fatigue    HLD (hyperlipidemia)    HTN (hypertension)    Hypogonadism in male    Lipoma of skin    Memory loss    Mixed Alzheimer's and vascular dementia (HCC)    Poor balance    Restless legs syndrome (RLS)    S/P placement of cardiac pacemaker 04/24/2023   Medtronic single lead PPM    Sleep apnea    cpap   Stroke [name]) 1991   Tremor      Past Surgical History:  Procedure Laterality Date   COLONOSCOPY     KNEE ARTHROPLASTY Left 06/04/2017   Procedure: LEFT TOTAL KNEE ARTHROPLASTY WITH COMPUTER NAVIGATION;  Surgeon: [name] Rogue, MD;  Location: [facility];  Service: Orthopedics;  Laterality: Left;  NEEDS RNFA   LOOP RECORDER INSERTION N/A 11/25/2021   Procedure: LOOP RECORDER INSERTION;  Surgeon: [name] Elspeth BROCKS, MD;  [name] INVASIVE CV LAB;  Service: Cardiovascular;  Laterality: N/A;   PACEMAKER IMPLANT N/A 04/24/2023   Procedure: PACEMAKER IMPLANT;  Surgeon: [name] Elspeth BROCKS, MD;  Location: [name] INVASIVE CV LAB;  Service: Cardiovascular;  Laterality: N/A;   TONSILLECTOMY AND ADENOIDECTOMY     TOTAL HIP ARTHROPLASTY Right 07/31/2016   Procedure: RIGHT TOTAL  HIP ARTHROPLASTY ANTERIOR APPROACH;  Surgeon: [name] Rogue, MD;  Location: [facility];  Service: Orthopedics;  Laterality: Right;  Requesting RNFA       Initial Focused Assessment (If applicable, or please see trauma documentation): Airway - clear Breathing - Unlabored Circulation - small cut to left eyebrow area GCS - 14 -- Basline  CT's Completed:   CT Head and CT C-Spine   Interventions:  Labs Xrays CT scans  Plan for disposition:  {Trauma Dispo:26867}   Consults completed:  Orthopaedic Surgeon at 1600 -- [name] Ned, PA .  Event Summary:  See edp/triage note  [name] [name]  Trauma Response RN  Please call TRN at [phone] for further assistance.

## 2023-12-24 NOTE — ED Provider Notes (Addendum)
 [place] EMERGENCY DEPARTMENT AT [facility] Provider Note  CSN: [id number] Arrival date & time: 12/24/23 1515  Chief Complaint(s) Fall  HPI [name] [name] is a 78 y.o. male with a history of dementia, coming from his memory care unit after A fall.  Patient had an unwitnessed fall at  [facility].  He is on Xarelto  for atrial fibrillation.  Patient was found down on the ground this afternoon.  Unclear how long he was down for.  Patient is unable to provide any history himself.   Past Medical History Past Medical History:  Diagnosis Date   Alzheimer disease (HCC)    Anxiety    Arthritis    Atrial fibrillation (HCC)    Chronic   Bipolar disorder (HCC)    Chronic low back pain    Dementia (HCC)    Depression    Dizziness    Dysrhythmia    a fib   ED (erectile dysfunction)    External hemorrhoids    Fatigue    HLD (hyperlipidemia)    HTN (hypertension)    Hypogonadism in male    Lipoma of skin    Memory loss    Mixed Alzheimer's and vascular dementia (HCC)    Poor balance    Restless legs syndrome (RLS)    S/P placement of cardiac pacemaker 04/24/2023   Medtronic single lead PPM    Sleep apnea    cpap   Stroke (HCC) 1991   Tremor    Patient Active Problem List   Diagnosis Date Noted   Bradycardia 04/25/2023   S/P placement of cardiac pacemaker 04/24/2023   Concussion 01/09/2023   OSA (obstructive sleep apnea) 01/09/2023   CAD (coronary artery disease) of artery bypass graft 01/09/2023   Dementia with behavioral disturbance (HCC) 01/09/2023   Atrial fibrillation (HCC) 01/09/2023   Chronic anticoagulation 01/09/2023   HTN (hypertension) 01/09/2023   Bipolar disorder (HCC) 01/09/2023   Dementia with behavioral disturbance (HCC) 01/04/2023   Fall at home, initial encounter 12/18/2022   Anemia of chronic disease 12/18/2022   Generalized weakness 12/17/2022   History of loop recorder - STJ 04/15/2022   Syncope, cardiogenic 11/24/2021   Intermittent  complete heart block (HCC) 11/24/2021   Obstructive sleep apnea 02/14/2021   Urinary urgency 07/11/2020   History of total knee arthroplasty 12/16/2018   Osteoarthritis of left knee 06/04/2017   B12 deficiency 03/19/2017   Loss of memory 03/18/2017   Diplopia 08/06/2016   History of atrial fibrillation 08/06/2016   History of bipolar disorder 08/06/2016   History of restless legs syndrome 08/06/2016   Weakness 02/10/2016   Essential tremor 07/17/2015   Mixed Alzheimer's and vascular dementia (HCC) 10/28/2013   Parkinsonian features 03/31/2013   History of stroke 03/23/2013   Hyperlipidemia 03/20/2013   Brain TIA 03/18/2013   Gait instability 03/17/2013   Chronic atrial fibrillation (HCC) 03/17/2013   Essential hypertension 03/17/2013   Home Medication(s) Prior to Admission medications   Medication Sig Start Date End Date Taking? Authorizing Provider  acetaminophen  (TYLENOL ) 325 MG tablet Take 650 mg by mouth in the morning and at bedtime.    [provider]  ALPRAZolam SHEFFIELD) 1 MG tablet Take 1 mg by mouth 2 (two) times daily as needed for anxiety.    [provider]  amoxicillin -clavulanate (AUGMENTIN ) 875-125 MG tablet Take 1 tablet by mouth every 12 (twelve) hours. 11/06/23   Dionisio Blunt, MD  atorvastatin  (LIPITOR) 40 MG tablet Take 1 tablet (40 mg total) by mouth  every evening. Please call our office to schedule an yearly appointment with Dr. Court for November 2024 before anymore refills. [phone] 12/25/22   Court Dorn PARAS, MD  carbidopa -levodopa  (SINEMET  IR) 25-250 MG tablet Take 1 tablet by mouth 2 (two) times daily. 11/25/22   [provider]  divalproex  (DEPAKOTE ) 125 MG DR tablet Take 250 mg by mouth 3 (three) times daily. 11/25/22   [provider]  donepezil  (ARICEPT ) 10 MG tablet Take 10 mg by mouth at bedtime. 11/25/22   [provider]  eszopiclone (LUNESTA) 2 MG TABS tablet Take 2 mg by mouth at bedtime. Take  immediately before bedtime    [provider]  folic acid  (FOLVITE ) 1 MG tablet Take 1 mg by mouth at bedtime. 11/25/22   [provider]  lamoTRIgine  (LAMICTAL ) 150 MG tablet Take 150 mg by mouth 2 (two) times daily. 11/25/22   [provider]  lisinopril  (ZESTRIL ) 10 MG tablet Take 1 tablet (10 mg total) by mouth every evening. Please call our office to schedule an yearly appointment with Dr. Court for November 2024 before anymore refills. [phone]. Thank you 1st attempt Patient taking differently: Take 20 mg by mouth every evening. Please call our office to schedule an yearly appointment with Dr. Court for November 2024 before anymore refills. [phone]. Thank you 1st attempt 12/25/22   Court Dorn PARAS, MD  memantine  (NAMENDA ) 10 MG tablet Take 10 mg by mouth 2 (two) times daily. 11/25/22   [provider]  mirtazapine  (REMERON ) 15 MG tablet Take 15 mg by mouth at bedtime. 11/25/22   [provider]  REPATHA SURECLICK 140 MG/ML SOAJ Inject 140 mg into the skin every 14 (fourteen) days. 11/25/22   [provider]  torsemide  (DEMADEX ) 10 MG tablet Take 10 mg by mouth daily.    [provider]  ziprasidone  (GEODON ) 40 MG capsule Take 40 mg by mouth every evening. To be given in addition to 60mg  in the morning for a total daily dose of 100mg .    [provider]  ziprasidone  (GEODON ) 60 MG capsule Take 60 mg by mouth in the morning. To be given in addition to 40mg  in the evening for a total daily dose of 100mg .    [provider]                                                                                                                                    Past Surgical History Past Surgical History:  Procedure Laterality Date   COLONOSCOPY     KNEE ARTHROPLASTY Left 06/04/2017   Procedure: LEFT TOTAL KNEE ARTHROPLASTY WITH COMPUTER NAVIGATION;  Surgeon: Fidel Rogue, MD;  Location: [facility];  Service: Orthopedics;   Laterality: Left;  NEEDS RNFA   LOOP RECORDER INSERTION N/A 11/25/2021   Procedure: LOOP RECORDER INSERTION;  Surgeon: Fernande Elspeth BROCKS, MD;  Location: [facility] INVASIVE CV LAB;  Service: Cardiovascular;  Laterality:  N/A;   PACEMAKER IMPLANT N/A 04/24/2023   Procedure: PACEMAKER IMPLANT;  Surgeon: Fernande Elspeth BROCKS, MD;  Location: [facility] INVASIVE CV LAB;  Service: Cardiovascular;  Laterality: N/A;   TONSILLECTOMY AND ADENOIDECTOMY     TOTAL HIP ARTHROPLASTY Right 07/31/2016   Procedure: RIGHT TOTAL HIP ARTHROPLASTY ANTERIOR APPROACH;  Surgeon: Fidel Rogue, MD;  Location: [facility];  Service: Orthopedics;  Laterality: Right;  Requesting RNFA   Family History Family History  Problem Relation Age of Onset   Suicidality Unknown    Cancer Unknown    Alcohol  abuse Unknown    Cancer Mother    Suicidality Father     Social History Social History   Tobacco Use   Smoking status: Never   Smokeless tobacco: Former    Quit date: 07/23/1968  Vaping Use   Vaping status: Never Used  Substance Use Topics   Alcohol  use: Yes    Alcohol /week: 1.0 standard drink of alcohol     Types: 1 Shots of liquor per week    Comment: occasional   Drug use: No   Allergies Latex  Review of Systems Review of Systems  Physical Exam Vital Signs  I have reviewed the triage vital signs BP (!) 154/82   Pulse (!) 59   Temp (S) (!) 96.2 F (35.7 C) (Temporal)   Resp 12   Ht 5' 11 (1.803 m)   Wt 75.8 kg   SpO2 100%   BMI 23.29 kg/m   Physical Exam Vitals and nursing note reviewed.  Constitutional:      Appearance: Normal appearance.  HENT:     Head: Normocephalic.     Comments: There is a 2.2 cm laceration over the corner of the left brow Eyes:     Pupils: Pupils are equal, round, and reactive to light.  Cardiovascular:     Rate and Rhythm: Normal rate.  Pulmonary:     Effort: Pulmonary effort is normal.  Abdominal:     General: Abdomen is flat.     Palpations: Abdomen is soft.  Musculoskeletal:         General: Normal range of motion.     Cervical back: Normal range of motion.     Comments: No tenderness to palpation in the bilateral shoulders, upper arms, elbows, forearms or wrists.  No tenderness to palpation in the chest.  Pelvis stable, nontender.  No tenderness, deformities noted on bilateral upper legs, knees, lower legs or ankles.  Patient able to lift both legs from the bed.  Neurological:     Mental Status: He is alert. Mental status is at baseline.     Motor: No weakness.     ED Results and Treatments Labs (all labs ordered are listed, but only abnormal results are displayed) Labs Reviewed  COMPREHENSIVE METABOLIC PANEL WITH GFR - Abnormal; Notable for the following components:      Result Value   Total Protein 6.3 (*)    Albumin 3.1 (*)    All other components within normal limits  CBC WITH DIFFERENTIAL/PLATELET - Abnormal; Notable for the following components:   RBC 3.13 (*)    Hemoglobin 9.5 (*)    HCT 31.1 (*)    RDW 16.0 (*)    Platelets 113 (*)    All other components within normal limits  CK  Radiology CT Head Wo Contrast Result Date: 12/24/2023 CLINICAL DATA:  Ataxia, head trauma EXAM: CT HEAD WITHOUT CONTRAST TECHNIQUE: Contiguous axial images were obtained from the base of the skull through the vertex without intravenous contrast. RADIATION DOSE REDUCTION: This exam was performed according to the departmental dose-optimization program which includes automated exposure control, adjustment of the mA and/or kV according to patient size and/or use of iterative reconstruction technique. COMPARISON:  01/09/2023 FINDINGS: Brain: No acute infarct or hemorrhage. Chronic small vessel ischemic changes are again seen within the right MCA distribution and bilateral periventricular white matter. Lateral ventricles and midline structures are otherwise  unremarkable. No acute extra-axial fluid collections. No mass effect. Vascular: No hyperdense vessel or unexpected calcification. Skull: Normal. Negative for fracture or focal lesion. Sinuses/Orbits: No acute finding. Other: None. IMPRESSION: 1. No acute intracranial process. 2. Stable chronic ischemic changes as above. Electronically Signed   By: Ozell Daring M.D.   On: 12/24/2023 16:14   CT Cervical Spine Wo Contrast Result Date: 12/24/2023 CLINICAL DATA:  Cervical trauma, ataxia EXAM: CT CERVICAL SPINE WITHOUT CONTRAST TECHNIQUE: Multidetector CT imaging of the cervical spine was performed without intravenous contrast. Multiplanar CT image reconstructions were also generated. RADIATION DOSE REDUCTION: This exam was performed according to the departmental dose-optimization program which includes automated exposure control, adjustment of the mA and/or kV according to patient size and/or use of iterative reconstruction technique. COMPARISON:  01/09/2023 FINDINGS: Alignment: Alignment is grossly anatomic. Skull base and vertebrae: No acute fracture. No primary bone lesion or focal pathologic process. Soft tissues and spinal canal: No prevertebral fluid or swelling. No visible canal hematoma. Disc levels: There is bony ankylosis between the right lateral mass of C1 and the C2 vertebral body. Bony fusion across the left C2-3 facets and right C3-4 facets. There is progressive hypertrophic change at the C1-C2 interface. Stable diffuse cervical spondylosis and facet hypertrophy, with flowing anterior osteophytes throughout the entirety of the cervicothoracic spine. Upper chest: Airway is patent. Visualized portions of the lung apices are clear. Other: Reconstructed images demonstrate no additional findings. IMPRESSION: 1. No acute cervical spine fracture. 2. Progressive multilevel cervical degenerative changes as above. Electronically Signed   By: Ozell Daring M.D.   On: 12/24/2023 16:13   DG Pelvis 1-2  Views Result Date: 12/24/2023 CLINICAL DATA:  Un witnessed fall EXAM: PELVIS - 1-2 VIEW COMPARISON:  01/09/2023 FINDINGS: Single frontal view of the pelvis includes both hips. Stable right hip arthroplasty. No acute fracture, subluxation, or dislocation. Stable left hip osteoarthritis. Stable multilevel lower lumbar spondylosis and facet hypertrophy. IMPRESSION: 1. No acute displaced fracture. 2. Stable right hip arthroplasty. 3. Stable degenerative changes of the lumbar spine and left hip. Electronically Signed   By: Ozell Daring M.D.   On: 12/24/2023 15:46   DG Chest Portable 1 View Result Date: 12/24/2023 CLINICAL DATA:  Un witnessed fall EXAM: PORTABLE CHEST 1 VIEW COMPARISON:  11/09/2023 FINDINGS: Single frontal view of the chest demonstrates stable enlargement of the cardiac silhouette. Single lead pacer and loop recorder again noted. Persistent retrocardiac opacity consistent with left lower lobe consolidation, likely atelectasis. No other areas of consolidation, effusion, or pneumothorax. Mild vascular congestion. No acute displaced fractures. IMPRESSION: 1. Persistent retrocardiac opacification, favor atelectasis over airspace disease. 2. Stable enlarged cardiac silhouette. Electronically Signed   By: Ozell Daring M.D.   On: 12/24/2023 15:46    Pertinent labs & imaging results that were available during my care of the patient were reviewed by me and considered in  my medical decision making (see MDM for details).  Medications Ordered in ED Medications  Tdap (ADACEL) injection 0.5 mL (0.5 mLs Intramuscular Given 12/24/23 1704)  lactated ringers  bolus 1,000 mL (1,000 mLs Intravenous New Bag/Given 12/24/23 1657)                                                                                                                                     Procedures .Laceration Repair  Date/Time: 12/24/2023 5:03 PM  Performed by: Mannie Fairy DASEN, DO Authorized by: Mannie Fairy DASEN, DO    Consent:    Consent obtained:  Verbal   Consent given by:  Patient   Risks, benefits, and alternatives were discussed: yes     Risks discussed:  Infection Anesthesia:    Anesthesia method:  None Laceration details:    Location:  Face   Face location:  L eyebrow   Length (cm):  2.2 Treatment:    Area cleansed with:  Saline   Amount of cleaning:  Standard   Irrigation solution:  Sterile saline Skin repair:    Repair method:  Tissue adhesive Approximation:    Approximation:  Close Repair type:    Repair type:  Simple .Critical Care  Performed by: Mannie Fairy DASEN, DO Authorized by: Mannie Fairy DASEN, DO   Critical care provider statement:    Critical care time (minutes):  33   Critical care was necessary to treat or prevent imminent or life-threatening deterioration of the following conditions:  Trauma   Critical care was time spent personally by me on the following activities:  Development of treatment plan with patient or surrogate, discussions with consultants, evaluation of patient's response to treatment, examination of patient, ordering and review of laboratory studies, ordering and review of radiographic studies, ordering and performing treatments and interventions, pulse oximetry, re-evaluation of patient's condition and review of old charts   (including critical care time)  Medical Decision Making / ED Course   This patient presents to the ED for concern of fall, this involves an extensive number of treatment options, and is a complaint that carries with it a high risk of complications and morbidity.  The differential diagnosis includes intracranial hemorrhage, facial contusion, facial laceration, less likely pelvic fracture, less likely hip fracture, consider rhabdomyolysis.  MDM:  Per EMS report, patient appears to be at his baseline.  Patient is alert and oriented x 2.  He is unable to tell me when he fell or how he fell.  On physical exam, no evidence of any  traumatic injuries aside from laceration to the left brow.  Will obtain imaging of the patient's head and neck.  Chest and pelvis x-rays ordered.  Basic blood work and CK ordered.  His abdomen is soft.  He has no tenderness.  There is no bruising.  Do not believe he requires imaging of the abdomen and pelvis.   Reassessment 5 PM-my independent read the patient's  head CT shows no intracranial hemorrhage.  CT cervical spine negative.  Chest x-ray and pelvis negative.  Blood work shows stable anemia, normal renal function, negative CK.  His temperature was taken temporally which is why believe it is low.  Patient feels normothermic, I do not believe we need to subject him to a rectal temp.  Will discharge.    Additional history obtained: -Additional history obtained from EMS -External records from outside source obtained and reviewed including: Chart review including previous notes, labs, imaging, consultation notes   Lab Tests: -I ordered, reviewed, and interpreted labs.   The pertinent results include:   Labs Reviewed  COMPREHENSIVE METABOLIC PANEL WITH GFR - Abnormal; Notable for the following components:      Result Value   Total Protein 6.3 (*)    Albumin 3.1 (*)    All other components within normal limits  CBC WITH DIFFERENTIAL/PLATELET - Abnormal; Notable for the following components:   RBC 3.13 (*)    Hemoglobin 9.5 (*)    HCT 31.1 (*)    RDW 16.0 (*)    Platelets 113 (*)    All other components within normal limits  CK     EKG this my dependent review the patient's EKG shows atrial fibrillation with normal rate.  No evidence of acute ischemia.  Imaging Studies ordered: I ordered imaging studies including CT head, CT cervical spine, chest x-ray and pelvic x-ray I independently visualized and interpreted imaging. I agree with the radiologist interpretation   Medicines ordered and prescription drug management: Meds ordered this encounter  Medications   Tdap (ADACEL)  injection 0.5 mL   lactated ringers  bolus 1,000 mL    -I have reviewed the patients home medicines and have made adjustments as needed  Critical interventions Management of level 2 trauma   Cardiac Monitoring: The patient was maintained on a cardiac monitor.  I personally viewed and interpreted the cardiac monitored which showed an underlying rhythm of: Atrial fibrillation, rate controlled  Social Determinants of Health:  Factors impacting patients care include: Dementia   Reevaluation: After the interventions noted above, I reevaluated the patient and found that they have :improved  Co morbidities that complicate the patient evaluation  Past Medical History:  Diagnosis Date   Alzheimer disease (HCC)    Anxiety    Arthritis    Atrial fibrillation (HCC)    Chronic   Bipolar disorder (HCC)    Chronic low back pain    Dementia (HCC)    Depression    Dizziness    Dysrhythmia    a fib   ED (erectile dysfunction)    External hemorrhoids    Fatigue    HLD (hyperlipidemia)    HTN (hypertension)    Hypogonadism in male    Lipoma of skin    Memory loss    Mixed Alzheimer's and vascular dementia (HCC)    Poor balance    Restless legs syndrome (RLS)    S/P placement of cardiac pacemaker 04/24/2023   Medtronic single lead PPM    Sleep apnea    cpap   Stroke [facility]) 1991   Tremor       Dispostion: I considered admission for this patient, however with his reassuring workup he is appropriate to return to his skilled nursing facilities.     Final Clinical Impression(s) / ED Diagnoses Final diagnoses:  Fall, initial encounter  Laceration of forehead, initial encounter     @PCDICTATION @    Mannie Pac T, DO 12/24/23  1708    Mannie Pac T, DO 12/24/23 1715    Mannie Pac T, DO 12/24/23 1715

## 2023-12-24 NOTE — ED Notes (Signed)
 Xray complete

## 2023-12-25 ENCOUNTER — Emergency Department (HOSPITAL_COMMUNITY)

## 2023-12-25 ENCOUNTER — Inpatient Hospital Stay (HOSPITAL_COMMUNITY)
Admission: EM | Admit: 2023-12-25 | Discharge: 2023-12-29 | DRG: 871 | Disposition: A | Discharge status: Skilled Nursing Facility | Attending: Family Medicine | Admitting: Family Medicine

## 2023-12-25 ENCOUNTER — Encounter (HOSPITAL_COMMUNITY): Payer: Self-pay | Admitting: Family Medicine

## 2023-12-25 DIAGNOSIS — R1310 Dysphagia, unspecified: Secondary | ICD-10-CM | POA: Diagnosis not present

## 2023-12-25 DIAGNOSIS — J188 Other pneumonia, unspecified organism: Principal | ICD-10-CM

## 2023-12-25 DIAGNOSIS — E872 Acidosis, unspecified: Secondary | ICD-10-CM | POA: Diagnosis present

## 2023-12-25 DIAGNOSIS — F319 Bipolar disorder, unspecified: Secondary | ICD-10-CM | POA: Diagnosis present

## 2023-12-25 DIAGNOSIS — F0150 Vascular dementia without behavioral disturbance: Secondary | ICD-10-CM | POA: Diagnosis present

## 2023-12-25 DIAGNOSIS — Z8659 Personal history of other mental and behavioral disorders: Secondary | ICD-10-CM | POA: Diagnosis not present

## 2023-12-25 DIAGNOSIS — F0283 Dementia in other diseases classified elsewhere, unspecified severity, with mood disturbance: Secondary | ICD-10-CM | POA: Diagnosis present

## 2023-12-25 DIAGNOSIS — Z7901 Long term (current) use of anticoagulants: Secondary | ICD-10-CM

## 2023-12-25 DIAGNOSIS — S0181XA Laceration without foreign body of other part of head, initial encounter: Secondary | ICD-10-CM | POA: Diagnosis present

## 2023-12-25 DIAGNOSIS — Z1152 Encounter for screening for COVID-19: Secondary | ICD-10-CM

## 2023-12-25 DIAGNOSIS — W19XXXA Unspecified fall, initial encounter: Secondary | ICD-10-CM | POA: Diagnosis present

## 2023-12-25 DIAGNOSIS — Z950 Presence of cardiac pacemaker: Secondary | ICD-10-CM

## 2023-12-25 DIAGNOSIS — G9340 Encephalopathy, unspecified: Secondary | ICD-10-CM

## 2023-12-25 DIAGNOSIS — I69991 Dysphagia following unspecified cerebrovascular disease: Secondary | ICD-10-CM | POA: Diagnosis not present

## 2023-12-25 DIAGNOSIS — I442 Atrioventricular block, complete: Secondary | ICD-10-CM | POA: Diagnosis present

## 2023-12-25 DIAGNOSIS — G20A1 Parkinson's disease without dyskinesia, without mention of fluctuations: Secondary | ICD-10-CM | POA: Diagnosis present

## 2023-12-25 DIAGNOSIS — Z8673 Personal history of transient ischemic attack (TIA), and cerebral infarction without residual deficits: Secondary | ICD-10-CM

## 2023-12-25 DIAGNOSIS — S0532XA Ocular laceration without prolapse or loss of intraocular tissue, left eye, initial encounter: Secondary | ICD-10-CM | POA: Diagnosis present

## 2023-12-25 DIAGNOSIS — J189 Pneumonia, unspecified organism: Secondary | ICD-10-CM | POA: Diagnosis present

## 2023-12-25 DIAGNOSIS — S01119A Laceration without foreign body of unspecified eyelid and periocular area, initial encounter: Secondary | ICD-10-CM | POA: Diagnosis present

## 2023-12-25 DIAGNOSIS — S0012XD Contusion of left eyelid and periocular area, subsequent encounter: Secondary | ICD-10-CM | POA: Diagnosis not present

## 2023-12-25 DIAGNOSIS — A419 Sepsis, unspecified organism: Principal | ICD-10-CM | POA: Diagnosis present

## 2023-12-25 DIAGNOSIS — G9341 Metabolic encephalopathy: Secondary | ICD-10-CM | POA: Diagnosis present

## 2023-12-25 DIAGNOSIS — Z87891 Personal history of nicotine dependence: Secondary | ICD-10-CM

## 2023-12-25 DIAGNOSIS — E785 Hyperlipidemia, unspecified: Secondary | ICD-10-CM | POA: Diagnosis present

## 2023-12-25 DIAGNOSIS — Z96641 Presence of right artificial hip joint: Secondary | ICD-10-CM | POA: Diagnosis present

## 2023-12-25 DIAGNOSIS — G309 Alzheimer's disease, unspecified: Secondary | ICD-10-CM | POA: Diagnosis present

## 2023-12-25 DIAGNOSIS — F3130 Bipolar disorder, current episode depressed, mild or moderate severity, unspecified: Secondary | ICD-10-CM | POA: Diagnosis not present

## 2023-12-25 DIAGNOSIS — Z66 Do not resuscitate: Secondary | ICD-10-CM | POA: Diagnosis present

## 2023-12-25 DIAGNOSIS — I25810 Atherosclerosis of coronary artery bypass graft(s) without angina pectoris: Secondary | ICD-10-CM | POA: Diagnosis present

## 2023-12-25 DIAGNOSIS — Z7189 Other specified counseling: Secondary | ICD-10-CM | POA: Diagnosis not present

## 2023-12-25 DIAGNOSIS — Z781 Physical restraint status: Secondary | ICD-10-CM

## 2023-12-25 DIAGNOSIS — R918 Other nonspecific abnormal finding of lung field: Secondary | ICD-10-CM | POA: Diagnosis not present

## 2023-12-25 DIAGNOSIS — I495 Sick sinus syndrome: Secondary | ICD-10-CM | POA: Diagnosis present

## 2023-12-25 DIAGNOSIS — I10 Essential (primary) hypertension: Secondary | ICD-10-CM | POA: Diagnosis present

## 2023-12-25 DIAGNOSIS — R296 Repeated falls: Secondary | ICD-10-CM | POA: Diagnosis not present

## 2023-12-25 DIAGNOSIS — Z96652 Presence of left artificial knee joint: Secondary | ICD-10-CM | POA: Diagnosis present

## 2023-12-25 DIAGNOSIS — F0280 Dementia in other diseases classified elsewhere without behavioral disturbance: Secondary | ICD-10-CM | POA: Diagnosis present

## 2023-12-25 DIAGNOSIS — J90 Pleural effusion, not elsewhere classified: Secondary | ICD-10-CM | POA: Diagnosis not present

## 2023-12-25 DIAGNOSIS — G2581 Restless legs syndrome: Secondary | ICD-10-CM | POA: Diagnosis present

## 2023-12-25 DIAGNOSIS — F0153 Vascular dementia, unspecified severity, with mood disturbance: Secondary | ICD-10-CM | POA: Diagnosis present

## 2023-12-25 DIAGNOSIS — F02818 Dementia in other diseases classified elsewhere, unspecified severity, with other behavioral disturbance: Secondary | ICD-10-CM | POA: Diagnosis not present

## 2023-12-25 DIAGNOSIS — Z23 Encounter for immunization: Secondary | ICD-10-CM

## 2023-12-25 DIAGNOSIS — Z515 Encounter for palliative care: Secondary | ICD-10-CM

## 2023-12-25 DIAGNOSIS — D649 Anemia, unspecified: Secondary | ICD-10-CM | POA: Diagnosis present

## 2023-12-25 DIAGNOSIS — R29818 Other symptoms and signs involving the nervous system: Secondary | ICD-10-CM | POA: Diagnosis not present

## 2023-12-25 DIAGNOSIS — N281 Cyst of kidney, acquired: Secondary | ICD-10-CM | POA: Diagnosis not present

## 2023-12-25 DIAGNOSIS — R6520 Severe sepsis without septic shock: Secondary | ICD-10-CM | POA: Diagnosis present

## 2023-12-25 DIAGNOSIS — I482 Chronic atrial fibrillation, unspecified: Secondary | ICD-10-CM | POA: Diagnosis present

## 2023-12-25 DIAGNOSIS — Z6822 Body mass index (BMI) 22.0-22.9, adult: Secondary | ICD-10-CM | POA: Diagnosis not present

## 2023-12-25 DIAGNOSIS — I2510 Atherosclerotic heart disease of native coronary artery without angina pectoris: Secondary | ICD-10-CM | POA: Diagnosis present

## 2023-12-25 DIAGNOSIS — J690 Pneumonitis due to inhalation of food and vomit: Secondary | ICD-10-CM | POA: Diagnosis not present

## 2023-12-25 LAB — URINALYSIS, ROUTINE W REFLEX MICROSCOPIC
Bilirubin Urine: NEGATIVE
Glucose, UA: NEGATIVE mg/dL
Hgb urine dipstick: NEGATIVE
Ketones, ur: 5 mg/dL — AB
Leukocytes,Ua: NEGATIVE
Nitrite: NEGATIVE
Protein, ur: NEGATIVE mg/dL
Specific Gravity, Urine: 1.015 (ref 1.005–1.030)
pH: 9 — ABNORMAL HIGH (ref 5.0–8.0)

## 2023-12-25 LAB — CBC WITH DIFFERENTIAL/PLATELET
Abs Immature Granulocytes: 0.02 K/uL (ref 0.00–0.07)
Basophils Absolute: 0 K/uL (ref 0.0–0.1)
Basophils Relative: 1 %
Eosinophils Absolute: 0.1 K/uL (ref 0.0–0.5)
Eosinophils Relative: 3 %
HCT: 34.7 % — ABNORMAL LOW (ref 39.0–52.0)
Hemoglobin: 10.8 g/dL — ABNORMAL LOW (ref 13.0–17.0)
Immature Granulocytes: 0 %
Lymphocytes Relative: 7 %
Lymphs Abs: 0.4 K/uL — ABNORMAL LOW (ref 0.7–4.0)
MCH: 30.6 pg (ref 26.0–34.0)
MCHC: 31.1 g/dL (ref 30.0–36.0)
MCV: 98.3 fL (ref 80.0–100.0)
Monocytes Absolute: 0.2 K/uL (ref 0.1–1.0)
Monocytes Relative: 4 %
Neutro Abs: 4.3 K/uL (ref 1.7–7.7)
Neutrophils Relative %: 85 %
Platelets: 119 K/uL — ABNORMAL LOW (ref 150–400)
RBC: 3.53 MIL/uL — ABNORMAL LOW (ref 4.22–5.81)
RDW: 15.9 % — ABNORMAL HIGH (ref 11.5–15.5)
WBC: 5.1 K/uL (ref 4.0–10.5)
nRBC: 0 % (ref 0.0–0.2)

## 2023-12-25 LAB — RESP PANEL BY RT-PCR (RSV, FLU A&B, COVID)  RVPGX2
Influenza A by PCR: NEGATIVE
Influenza B by PCR: NEGATIVE
Resp Syncytial Virus by PCR: NEGATIVE
SARS Coronavirus 2 by RT PCR: NEGATIVE

## 2023-12-25 LAB — I-STAT CHEM 8, ED
BUN: 19 mg/dL (ref 8–23)
Calcium, Ion: 1.22 mmol/L (ref 1.15–1.40)
Chloride: 102 mmol/L (ref 98–111)
Creatinine, Ser: 1.1 mg/dL (ref 0.61–1.24)
Glucose, Bld: 88 mg/dL (ref 70–99)
HCT: 35 % — ABNORMAL LOW (ref 39.0–52.0)
Hemoglobin: 11.9 g/dL — ABNORMAL LOW (ref 13.0–17.0)
Potassium: 4.6 mmol/L (ref 3.5–5.1)
Sodium: 137 mmol/L (ref 135–145)
TCO2: 27 mmol/L (ref 22–32)

## 2023-12-25 LAB — COMPREHENSIVE METABOLIC PANEL WITH GFR
ALT: 16 U/L (ref 0–44)
AST: 24 U/L (ref 15–41)
Albumin: 3.5 g/dL (ref 3.5–5.0)
Alkaline Phosphatase: 107 U/L (ref 38–126)
Anion gap: 11 (ref 5–15)
BUN: 16 mg/dL (ref 8–23)
CO2: 25 mmol/L (ref 22–32)
Calcium: 9.6 mg/dL (ref 8.9–10.3)
Chloride: 100 mmol/L (ref 98–111)
Creatinine, Ser: 1.01 mg/dL (ref 0.61–1.24)
GFR, Estimated: >60 mL/min (ref >60)
Glucose, Bld: 86 mg/dL (ref 70–99)
Potassium: 4.4 mmol/L (ref 3.5–5.1)
Sodium: 136 mmol/L (ref 135–145)
Total Bilirubin: 0.6 mg/dL (ref 0.0–1.2)
Total Protein: 7.3 g/dL (ref 6.5–8.1)

## 2023-12-25 LAB — I-STAT CG4 LACTIC ACID, ED
Lactic Acid, Venous: 2.5 mmol/L (ref 0.5–1.9)
Lactic Acid, Venous: 1.4 mmol/L (ref 0.5–1.9)

## 2023-12-25 LAB — CBG MONITORING, ED: Glucose-Capillary: 85 mg/dL (ref 70–99)

## 2023-12-25 MED ORDER — ACETAMINOPHEN 650 MG RE SUPP
650.0000 mg | Freq: Once | RECTAL | Status: AC
  Administered 2023-12-25: 650 mg via RECTAL
  Filled 2023-12-25: qty 1

## 2023-12-25 MED ORDER — LORAZEPAM 2 MG/ML IJ SOLN
1.0000 mg | Freq: Once | INTRAMUSCULAR | Status: AC
  Administered 2023-12-25: 1 mg via INTRAVENOUS
  Filled 2023-12-25: qty 1

## 2023-12-25 MED ORDER — SODIUM CHLORIDE 0.9 % IV SOLN
1.0000 g | Freq: Once | INTRAVENOUS | Status: AC
  Administered 2023-12-25: 1 g via INTRAVENOUS
  Filled 2023-12-25: qty 10

## 2023-12-25 MED ORDER — ACETAMINOPHEN 500 MG PO TABS: 1000.0000 mg | ORAL_TABLET | Freq: Once | ORAL | Status: DC

## 2023-12-25 MED ORDER — SODIUM CHLORIDE 0.9 % IV SOLN
500.0000 mg | Freq: Once | INTRAVENOUS | Status: AC
  Administered 2023-12-25: 500 mg via INTRAVENOUS
  Filled 2023-12-25: qty 5

## 2023-12-25 MED ORDER — SODIUM CHLORIDE 0.9 % IV BOLUS
1000.0000 mL | Freq: Once | INTRAVENOUS | Status: AC
  Administered 2023-12-25: 1000 mL via INTRAVENOUS

## 2023-12-25 MED ORDER — SODIUM CHLORIDE 0.9 % IV BOLUS
500.0000 mL | Freq: Once | INTRAVENOUS | Status: AC
  Administered 2023-12-25: 500 mL via INTRAVENOUS

## 2023-12-25 MED ORDER — HALOPERIDOL LACTATE 5 MG/ML IJ SOLN
2.0000 mg | Freq: Once | INTRAMUSCULAR | Status: AC
  Administered 2023-12-25: 2 mg via INTRAVENOUS
  Filled 2023-12-25: qty 1

## 2023-12-25 MED ORDER — IOHEXOL 350 MG/ML SOLN
75.0000 mL | Freq: Once | INTRAVENOUS | Status: AC | PRN
  Administered 2023-12-25: 75 mL via INTRAVENOUS

## 2023-12-25 NOTE — ED Notes (Signed)
 Patient transported to CT

## 2023-12-25 NOTE — Assessment & Plan Note (Signed)
 At baseline the patient has advanced dementia, but is cooperative with cares.  Here he has decreased responsiveness, agitation.  Likely precipitated by sepsis. - Standard delirium precautions

## 2023-12-25 NOTE — Assessment & Plan Note (Signed)
 Blood pressure elevated - Continue lisinopril  - Hold torsemide 

## 2023-12-25 NOTE — Assessment & Plan Note (Signed)
 Bipolar disorder He is somewhat agitated here - Continue home Depakote , Aricept , Lamictal , memantine , mirtazapine , and Geodon 

## 2023-12-25 NOTE — ED Notes (Signed)
 CCMD called.

## 2023-12-25 NOTE — Assessment & Plan Note (Addendum)
 Last electrophysiology note, and dispense report show that he is not on anticoagulation.  ECG shows rate controlled A-fib

## 2023-12-25 NOTE — ED Notes (Signed)
Pt brief and linen changed  

## 2023-12-25 NOTE — Assessment & Plan Note (Signed)
 Used to follow with Dr. Lane from [facility] neurology.  Appears to be still on Sinemet  - Continue home Sinemet 

## 2023-12-25 NOTE — Assessment & Plan Note (Signed)
-   Continue Lipitor - Continue lisinopril

## 2023-12-25 NOTE — ED Notes (Signed)
 Patient returned from CT

## 2023-12-25 NOTE — ED Provider Notes (Signed)
 [facility] EMERGENCY DEPARTMENT AT [facility] Provider Note   CSN: [id number] Arrival date & time: 12/25/23  1610     Patient presents with: No chief complaint on file.   [name] [name] is a 78 y.o. male.   Pt is an 78 yo male with pmhx significant for dementia, hld, htn, afib (on Xarelto ), depression, anxiety, and arthritis.  Pt was seen in the ED yesterday for a fall.  He had a trauma work up and was d/c back to the facility.  He has been agitated all day.  Pt likely did not get evening meds as he was here.  Fever for EMS.  Wife gives additional hx:  2-3 weeks ago, pt was diagnosed with pna.  The facility gave him iv abx and oxygen and he seemed to get better.       Prior to Admission medications   Medication Sig Start Date End Date Taking? Authorizing Provider  acetaminophen  (TYLENOL ) 325 MG tablet Take 650 mg by mouth in the morning and at bedtime.    [provider]  ALPRAZolam SHEFFIELD) 1 MG tablet Take 1 mg by mouth 2 (two) times daily as needed for anxiety.    [provider]  amoxicillin -clavulanate (AUGMENTIN ) 875-125 MG tablet Take 1 tablet by mouth every 12 (twelve) hours. 11/06/23   Dionisio Blunt, MD  atorvastatin  (LIPITOR) 40 MG tablet Take 1 tablet (40 mg total) by mouth every evening. Please call our office to schedule an yearly appointment with Dr. Court for November 2024 before anymore refills. [phone] 12/25/22   Court Dorn PARAS, MD  carbidopa -levodopa  (SINEMET  IR) 25-250 MG tablet Take 1 tablet by mouth 2 (two) times daily. 11/25/22   [provider]  divalproex  (DEPAKOTE ) 125 MG DR tablet Take 250 mg by mouth 3 (three) times daily. 11/25/22   [provider]  donepezil  (ARICEPT ) 10 MG tablet Take 10 mg by mouth at bedtime. 11/25/22   [provider]  eszopiclone (LUNESTA) 2 MG TABS tablet Take 2 mg by mouth at bedtime. Take immediately before bedtime    [provider]  folic acid  (FOLVITE ) 1  MG tablet Take 1 mg by mouth at bedtime. 11/25/22   [provider]  lamoTRIgine  (LAMICTAL ) 150 MG tablet Take 150 mg by mouth 2 (two) times daily. 11/25/22   [provider]  lisinopril  (ZESTRIL ) 10 MG tablet Take 1 tablet (10 mg total) by mouth every evening. Please call our office to schedule an yearly appointment with Dr. Court for November 2024 before anymore refills. [phone]. Thank you 1st attempt Patient taking differently: Take 20 mg by mouth every evening. Please call our office to schedule an yearly appointment with Dr. Court for November 2024 before anymore refills. [phone]. Thank you 1st attempt 12/25/22   Court Dorn PARAS, MD  memantine  (NAMENDA ) 10 MG tablet Take 10 mg by mouth 2 (two) times daily. 11/25/22   [provider]  mirtazapine  (REMERON ) 15 MG tablet Take 15 mg by mouth at bedtime. 11/25/22   [provider]  REPATHA SURECLICK 140 MG/ML SOAJ Inject 140 mg into the skin every 14 (fourteen) days. 11/25/22   [provider]  torsemide  (DEMADEX ) 10 MG tablet Take 10 mg by mouth daily.    [provider]  ziprasidone  (GEODON ) 40 MG capsule Take 40 mg by mouth every evening. To be given in addition to 60mg  in the morning for a total daily dose of 100mg .    [provider]  ziprasidone  (  GEODON ) 60 MG capsule Take 60 mg by mouth in the morning. To be given in addition to 40mg  in the evening for a total daily dose of 100mg .    [provider]    Allergies: Latex    Review of Systems  Unable to perform ROS: Dementia  All other systems reviewed and are negative.   Updated Vital Signs BP (!) 151/80   Pulse 61   Temp 98 F (36.7 C)   Resp (!) 30   SpO2 96%   Physical Exam Vitals and nursing note reviewed.  Constitutional:      Appearance: Normal appearance.  HENT:     Head: Normocephalic.     Comments: Healing wound left forehead    Right Ear: External ear normal.     Left Ear: External ear  normal.     Nose: Nose normal.     Mouth/Throat:     Mouth: Mucous membranes are moist.     Pharynx: Oropharynx is clear.  Eyes:     Extraocular Movements: Extraocular movements intact.     Conjunctiva/sclera: Conjunctivae normal.     Pupils: Pupils are equal, round, and reactive to light.  Cardiovascular:     Rate and Rhythm: Normal rate and regular rhythm.     Pulses: Normal pulses.     Heart sounds: Normal heart sounds.  Pulmonary:     Effort: Pulmonary effort is normal.     Breath sounds: Normal breath sounds.  Abdominal:     General: Abdomen is flat. Bowel sounds are normal.     Palpations: Abdomen is soft.  Musculoskeletal:        General: Normal range of motion.     Cervical back: Normal range of motion and neck supple.  Skin:    General: Skin is warm.     Capillary Refill: Capillary refill takes less than 2 seconds.  Neurological:     Mental Status: He is alert. He is disoriented.  Psychiatric:        Behavior: Behavior is agitated.     (all labs ordered are listed, but only abnormal results are displayed) Labs Reviewed  CBC WITH DIFFERENTIAL/PLATELET - Abnormal; Notable for the following components:      Result Value   RBC 3.53 (*)    Hemoglobin 10.8 (*)    HCT 34.7 (*)    RDW 15.9 (*)    Platelets 119 (*)    Lymphs Abs 0.4 (*)    All other components within normal limits  URINALYSIS, ROUTINE W REFLEX MICROSCOPIC - Abnormal; Notable for the following components:   pH 9.0 (*)    Ketones, ur 5 (*)    All other components within normal limits  I-STAT CHEM 8, ED - Abnormal; Notable for the following components:   Hemoglobin 11.9 (*)    HCT 35.0 (*)    All other components within normal limits  I-STAT CG4 LACTIC ACID, ED - Abnormal; Notable for the following components:   Lactic Acid, Venous 2.5 (*)    All other components within normal limits  RESP PANEL BY RT-PCR (RSV, FLU A&B, COVID)  RVPGX2  CULTURE, BLOOD (ROUTINE X 2)  CULTURE, BLOOD (ROUTINE X 2)   COMPREHENSIVE METABOLIC PANEL WITH GFR  CBG MONITORING, ED  I-STAT CG4 LACTIC ACID, ED    EKG: EKG Interpretation Date/Time:  Friday December 25 2023 16:26:31 EDT Ventricular Rate:  76 PR Interval:    QRS Duration:  107 QT Interval:  410 QTC Calculation: 461 R  Axis:   81  Text Interpretation: Atrial fibrillation Borderline right axis deviation Abnormal R-wave progression, late transition Minimal ST depression, inferior leads No significant change since last tracing Confirmed by Dean Clarity [phone]) on 12/25/2023 4:43:30 PM  Radiology: CT CHEST ABDOMEN PELVIS W CONTRAST Result Date: 12/25/2023 EXAM: CT CHEST, ABDOMEN AND PELVIS WITH CONTRAST 12/25/2023 08:56:29 PM TECHNIQUE: CT of the chest, abdomen and pelvis was performed with the administration of 75 mL of iohexol  (OMNIPAQUE ) 350 MG/ML injection. Multiplanar reformatted images are provided for review. Automated exposure control, iterative reconstruction, and/or weight based adjustment of the mA/kV was utilized to reduce the radiation dose to as low as reasonably achievable. COMPARISON: 01/09/2023 CLINICAL HISTORY: Sepsis. FINDINGS: LIMITATIONS: Motion degraded images. CHEST: MEDIASTINUM AND LYMPH NODES: Mild cardiomegaly. Postoperative clavicle pacemaker. Calcified subcarinal node, chronic. No mediastinal, hilar or axillary lymphadenopathy. LUNGS AND PLEURA: Small bilateral pleural effusions. Mild patchy left lower lobe opacity with additional faint ground glass nodular opacities in the right upper lobe, suggesting multifocal pneumonia. Mild compressive atelectasis in the right lower lobe. ABDOMEN AND PELVIS: LIVER: The liver is unremarkable. GALLBLADDER AND BILE DUCTS: Gallbladder is unremarkable. No biliary ductal dilatation. SPLEEN: Calcified splenic granulomata. PANCREAS: No acute abnormality. ADRENAL GLANDS: No acute abnormality. KIDNEYS, URETERS AND BLADDER: 3.7 cm simple right upper pole renal cyst (image 72), benign (Bosniak 1). No  follow-up is recommended. No stones in the kidneys or ureters. No hydronephrosis. No perinephric or periureteral stranding. Urinary bladder is unremarkable. GI AND BOWEL: Stomach demonstrates no acute abnormality. There is no bowel obstruction. 2.5 cm enhancing polypoid lesion in the distal ileum (image 88), chronic but better visualized on the current study, favoring ileal carcinoid. Mild cervical stool burden. REPRODUCTIVE ORGANS: The prostate is unremarkable. PERITONEUM AND RETROPERITONEUM: No ascites. No free air. 2.3 x 3.3 cm partially calcified soft tissue lesion in the right lower quadrant mesentery (image 83), grossly unchanged, favoring mesenteric carcinoid. VASCULATURE: Aorta is normal in caliber. Atherosclerotic calcifications of the abdominal aorta and branch vessels, although patent. ABDOMINAL AND PELVIS LYMPH NODES: No lymphadenopathy. BONES AND SOFT TISSUES: Right hip arthroplasty. Mild degenerative changes of the left hip. Moderate degenerative changes of the visualized thoracolumbar spine. Mild diastasis of the midline anterior abdominal wall. No acute osseous abnormality. No focal soft tissue abnormality. IMPRESSION: 1. Motion degraded images, limiting evaluation. 2. Multifocal pneumonia, left lower lobe predominant. Small bilateral pleural effusions. 3. 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 Electronically signed by: Pinkie Pebbles MD 12/25/2023 09:05 PM EDT RP Workstation: [id number]   DG Chest Port 1 View Result Date: 12/25/2023 CLINICAL DATA:  [id number] AMS (altered mental status) [id number] EXAM: PORTABLE CHEST - 1 VIEW COMPARISON:  12/24/2023 FINDINGS: Low lung volumes. No focal airspace consolidation, pleural effusion, or pneumothorax. Moderate cardiomegaly. Cardiac loop recorder device. Left chest pacemaker with a single lead terminating in the right ventricle. Tortuous aorta with aortic atherosclerosis. No acute fracture or destructive lesions.  Multilevel thoracic osteophytosis. IMPRESSION: Low lung volumes.  Otherwise, no acute cardiopulmonary abnormality. Electronically Signed   By: Rogelia Myers M.D.   On: 12/25/2023 18:49   CT Head Wo Contrast Result Date: 12/25/2023 CLINICAL DATA:  Blunt trauma, fall yesterday. EXAM: CT HEAD WITHOUT CONTRAST TECHNIQUE: Contiguous axial images were obtained from the base of the skull through the vertex without intravenous contrast. RADIATION DOSE REDUCTION: This exam was performed according to the departmental dose-optimization program which includes automated exposure control, adjustment of the mA and/or kV according to patient size and/or use of iterative  reconstruction technique. COMPARISON:  Head CT yesterday FINDINGS: Brain: No acute intracranial hemorrhage. No subdural or extra-axial collection. Stable degree of atrophy and chronic small vessel ischemia. Encephalomalacia in the right MCA distribution, unchanged from yesterday's exam. No evidence of new ischemia. No midline shift or mass lesion/mass effect. Vascular: No hyperdense vessel or unexpected calcification. Skull: No fracture or focal lesion. Sinuses/Orbits: No acute finding. Other: No large scalp hematoma. IMPRESSION: 1. No acute intracranial abnormality. No skull fracture. 2. Stable chronic change. Electronically Signed   By: Andrea Gasman M.D.   On: 12/25/2023 17:45   CT Head Wo Contrast Result Date: 12/24/2023 CLINICAL DATA:  Ataxia, head trauma EXAM: CT HEAD WITHOUT CONTRAST TECHNIQUE: Contiguous axial images were obtained from the base of the skull through the vertex without intravenous contrast. RADIATION DOSE REDUCTION: This exam was performed according to the departmental dose-optimization program which includes automated exposure control, adjustment of the mA and/or kV according to patient size and/or use of iterative reconstruction technique. COMPARISON:  01/09/2023 FINDINGS: Brain: No acute infarct or hemorrhage. Chronic small  vessel ischemic changes are again seen within the right MCA distribution and bilateral periventricular white matter. Lateral ventricles and midline structures are otherwise unremarkable. No acute extra-axial fluid collections. No mass effect. Vascular: No hyperdense vessel or unexpected calcification. Skull: Normal. Negative for fracture or focal lesion. Sinuses/Orbits: No acute finding. Other: None. IMPRESSION: 1. No acute intracranial process. 2. Stable chronic ischemic changes as above. Electronically Signed   By: Ozell Daring M.D.   On: 12/24/2023 16:14   CT Cervical Spine Wo Contrast Result Date: 12/24/2023 CLINICAL DATA:  Cervical trauma, ataxia EXAM: CT CERVICAL SPINE WITHOUT CONTRAST TECHNIQUE: Multidetector CT imaging of the cervical spine was performed without intravenous contrast. Multiplanar CT image reconstructions were also generated. RADIATION DOSE REDUCTION: This exam was performed according to the departmental dose-optimization program which includes automated exposure control, adjustment of the mA and/or kV according to patient size and/or use of iterative reconstruction technique. COMPARISON:  01/09/2023 FINDINGS: Alignment: Alignment is grossly anatomic. Skull base and vertebrae: No acute fracture. No primary bone lesion or focal pathologic process. Soft tissues and spinal canal: No prevertebral fluid or swelling. No visible canal hematoma. Disc levels: There is bony ankylosis between the right lateral mass of C1 and the C2 vertebral body. Bony fusion across the left C2-3 facets and right C3-4 facets. There is progressive hypertrophic change at the C1-C2 interface. Stable diffuse cervical spondylosis and facet hypertrophy, with flowing anterior osteophytes throughout the entirety of the cervicothoracic spine. Upper chest: Airway is patent. Visualized portions of the lung apices are clear. Other: Reconstructed images demonstrate no additional findings. IMPRESSION: 1. No acute cervical spine  fracture. 2. Progressive multilevel cervical degenerative changes as above. Electronically Signed   By: Ozell Daring M.D.   On: 12/24/2023 16:13   DG Pelvis 1-2 Views Result Date: 12/24/2023 CLINICAL DATA:  Un witnessed fall EXAM: PELVIS - 1-2 VIEW COMPARISON:  01/09/2023 FINDINGS: Single frontal view of the pelvis includes both hips. Stable right hip arthroplasty. No acute fracture, subluxation, or dislocation. Stable left hip osteoarthritis. Stable multilevel lower lumbar spondylosis and facet hypertrophy. IMPRESSION: 1. No acute displaced fracture. 2. Stable right hip arthroplasty. 3. Stable degenerative changes of the lumbar spine and left hip. Electronically Signed   By: Ozell Daring M.D.   On: 12/24/2023 15:46   DG Chest Portable 1 View Result Date: 12/24/2023 CLINICAL DATA:  Un witnessed fall EXAM: PORTABLE CHEST 1 VIEW COMPARISON:  11/09/2023 FINDINGS: Single  frontal view of the chest demonstrates stable enlargement of the cardiac silhouette. Single lead pacer and loop recorder again noted. Persistent retrocardiac opacity consistent with left lower lobe consolidation, likely atelectasis. No other areas of consolidation, effusion, or pneumothorax. Mild vascular congestion. No acute displaced fractures. IMPRESSION: 1. Persistent retrocardiac opacification, favor atelectasis over airspace disease. 2. Stable enlarged cardiac silhouette. Electronically Signed   By: Ozell Daring M.D.   On: 12/24/2023 15:46     Procedures   Medications Ordered in the ED  azithromycin (ZITHROMAX) 500 mg in sodium chloride  0.9 % 250 mL IVPB (500 mg Intravenous New Bag/Given 12/25/23 2251)  LORazepam  (ATIVAN ) injection 1 mg (1 mg Intravenous Given 12/25/23 1735)  sodium chloride  0.9 % bolus 500 mL (0 mLs Intravenous Stopped 12/25/23 1853)  acetaminophen  (TYLENOL ) suppository 650 mg (650 mg Rectal Given 12/25/23 1853)  sodium chloride  0.9 % bolus 1,000 mL (0 mLs Intravenous Stopped 12/25/23 2038)  iohexol   (OMNIPAQUE ) 350 MG/ML injection 75 mL (75 mLs Intravenous Contrast Given 12/25/23 2057)  cefTRIAXone (ROCEPHIN) 1 g in sodium chloride  0.9 % 100 mL IVPB (1 g Intravenous New Bag/Given 12/25/23 2219)                                    Medical Decision Making Amount and/or Complexity of Data Reviewed Labs: ordered. Radiology: ordered.  Risk OTC drugs. Prescription drug management. Decision regarding hospitalization.   This patient presents to the ED for concern of agitation, this involves an extensive number of treatment options, and is a complaint that carries with it a high risk of complications and morbidity.  The differential diagnosis includes head bleed, infection, dementia   Co morbidities that complicate the patient evaluation  dementia, hld, htn, afib (on Xarelto ), depression, anxiety, and arthritis   Additional history obtained:  Additional history obtained from epic chart review External records from outside source obtained and reviewed including EMS report   Lab Tests:  I Ordered, and personally interpreted labs.  The pertinent results include:  lactic elevated at 2.5; cbc nl other than hgb sl low at 10.8 (hgb 9.5 on 10/23); cmp nl; initial lactic acid 2.5, but down to 1.4 after ivfs; ua + ketones; covid/fllu/rsv neg   Imaging Studies ordered:  I ordered imaging studies including ct head and CXR I independently visualized and interpreted imaging which showed  CT head: No acute intracranial abnormality. No skull fracture.  2. Stable chronic change.  CXR: Low lung volumes.  Otherwise, no acute cardiopulmonary abnormality.  CT chest/abd/pelvis: 1. Motion degraded images, limiting evaluation.  2. Multifocal pneumonia, left lower lobe predominant. Small bilateral pleural  effusions.  3. 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   I agree with the radiologist interpretation   Cardiac Monitoring:  The patient was  maintained on a cardiac monitor.  I personally viewed and interpreted the cardiac monitored which showed an underlying rhythm of: st   Medicines ordered and prescription drug management:  I ordered medication including ivfs/tylenol   for fever and rocephin/zithromax for pna Reevaluation of the patient after these medicines showed that the patient improved I have reviewed the patients home medicines and have made adjustments as needed   Test Considered:  ct   Critical Interventions:  ivfs   Consultations Obtained:  I requested consultation with the hospitalist (Dr. Jonel),  and discussed lab and imaging findings as well as pertinent plan -he will admit  Problem List / ED Course:  Multifocal pna with encephalopathy:  pt given ivfs and iv abx (rocephin/zithromax).  Covid/flu/rsv neg.   Reevaluation:  After the interventions noted above, I reevaluated the patient and found that they have :improved   Social Determinants of Health:  Lives in SNF   Dispostion:  After consideration of the diagnostic results and the patients response to treatment, I feel that the patent would benefit from admission.       Final diagnoses:  Multifocal pneumonia  Encephalopathy acute    ED Discharge Orders     None          Dean Clarity, MD 12/25/23 2305

## 2023-12-25 NOTE — Hospital Course (Addendum)
 78 y.o. M with dementia, lives at memory care, AF and SSS s/p PPM not on AC, Parkinson's disease, known cecal mass and lung nodules, hx CAD, TIA, HTN and Bipolar who presented with fall and agitation.  Caveat that patient has advanced dementia and is confused and all history collected from wife and EDP.  Evidently, patient was in his usual state of health (cooperative with cares, ambulates with assistance, disoriented, but can respond to questions) until yesterday, he had an unwitnessed fall, was seen in the ER with an eyebrow laceration of the left eye, glued and sent home.  Today he had worsening agitation, was uncooperative and seemed altered from his baseline, so linden placed on him to the ER.  In the ER, lactic acid 2.5, CT chest abdomen and pelvis showed known right upper lobe nodules and mesenteric mass as well as a new left lower lobe pneumonia.   Given antibiotics and hospitalist service asked to evaluate.

## 2023-12-25 NOTE — Assessment & Plan Note (Addendum)
 Sepsis with endorgan dysfunction, presented with fever to 102F, tachycardia, tachypnea, change in mentation, lactic acidosis.  Found to have left lower lobe pneumonia. - Continue Rocephin and azithromycin - I-S and flutter as tolerated - Follow-up blood cultures

## 2023-12-25 NOTE — ED Triage Notes (Signed)
 Pt bibems from [facility]. Pt was seen yesterday in the ED for fall. Agitated all day. Pt is normally redirectable and has been unable to be redirected.

## 2023-12-25 NOTE — Assessment & Plan Note (Addendum)
 Followed by Dr. Annella.  Recently there was some plans to do imaging, but wife asked to hold off on this, and pursue primarily comfort approach. - Consult Palliative care

## 2023-12-26 ENCOUNTER — Other Ambulatory Visit: Payer: Self-pay

## 2023-12-26 DIAGNOSIS — R6520 Severe sepsis without septic shock: Secondary | ICD-10-CM | POA: Diagnosis not present

## 2023-12-26 DIAGNOSIS — A419 Sepsis, unspecified organism: Secondary | ICD-10-CM | POA: Diagnosis not present

## 2023-12-26 DIAGNOSIS — J188 Other pneumonia, unspecified organism: Secondary | ICD-10-CM | POA: Diagnosis not present

## 2023-12-26 DIAGNOSIS — Z515 Encounter for palliative care: Secondary | ICD-10-CM | POA: Diagnosis not present

## 2023-12-26 DIAGNOSIS — Z7189 Other specified counseling: Secondary | ICD-10-CM | POA: Diagnosis not present

## 2023-12-26 DIAGNOSIS — G9341 Metabolic encephalopathy: Secondary | ICD-10-CM | POA: Diagnosis not present

## 2023-12-26 LAB — CBC
HCT: 32.6 % — ABNORMAL LOW (ref 39.0–52.0)
Hemoglobin: 10.3 g/dL — ABNORMAL LOW (ref 13.0–17.0)
MCH: 30.4 pg (ref 26.0–34.0)
MCHC: 31.6 g/dL (ref 30.0–36.0)
MCV: 96.2 fL (ref 80.0–100.0)
Platelets: 106 K/uL — ABNORMAL LOW (ref 150–400)
RBC: 3.39 MIL/uL — ABNORMAL LOW (ref 4.22–5.81)
RDW: 15.9 % — ABNORMAL HIGH (ref 11.5–15.5)
WBC: 5 K/uL (ref 4.0–10.5)
nRBC: 0 % (ref 0.0–0.2)

## 2023-12-26 LAB — BASIC METABOLIC PANEL WITH GFR
Anion gap: 10 (ref 5–15)
BUN: 14 mg/dL (ref 8–23)
CO2: 25 mmol/L (ref 22–32)
Calcium: 9.7 mg/dL (ref 8.9–10.3)
Chloride: 100 mmol/L (ref 98–111)
Creatinine, Ser: 1.03 mg/dL (ref 0.61–1.24)
GFR, Estimated: >60 mL/min (ref >60)
Glucose, Bld: 97 mg/dL (ref 70–99)
Potassium: 4.1 mmol/L (ref 3.5–5.1)
Sodium: 135 mmol/L (ref 135–145)

## 2023-12-26 MED ORDER — ZIPRASIDONE HCL 40 MG PO CAPS
40.0000 mg | ORAL_CAPSULE | Freq: Every evening | ORAL | Status: DC
  Administered 2023-12-26 – 2023-12-28 (×3): 40 mg via ORAL
  Filled 2023-12-26 (×4): qty 1

## 2023-12-26 MED ORDER — DIVALPROEX SODIUM 250 MG PO DR TAB
250.0000 mg | DELAYED_RELEASE_TABLET | Freq: Three times a day (TID) | ORAL | Status: DC
  Administered 2023-12-26 – 2023-12-29 (×10): 250 mg via ORAL
  Filled 2023-12-26 (×12): qty 1

## 2023-12-26 MED ORDER — ZIPRASIDONE HCL 40 MG PO CAPS
60.0000 mg | ORAL_CAPSULE | Freq: Every morning | ORAL | Status: DC
  Administered 2023-12-26 – 2023-12-29 (×3): 60 mg via ORAL
  Filled 2023-12-26 (×6): qty 1

## 2023-12-26 MED ORDER — SODIUM CHLORIDE 0.9 % IV SOLN
500.0000 mg | INTRAVENOUS | Status: DC
  Administered 2023-12-26 – 2023-12-28 (×3): 500 mg via INTRAVENOUS
  Filled 2023-12-26 (×4): qty 5

## 2023-12-26 MED ORDER — ENOXAPARIN SODIUM 40 MG/0.4ML IJ SOSY
40.0000 mg | PREFILLED_SYRINGE | INTRAMUSCULAR | Status: DC
  Administered 2023-12-27 – 2023-12-29 (×3): 40 mg via SUBCUTANEOUS
  Filled 2023-12-26 (×2): qty 0.4

## 2023-12-26 MED ORDER — CARBIDOPA-LEVODOPA 25-250 MG PO TABS
1.0000 | ORAL_TABLET | Freq: Two times a day (BID) | ORAL | Status: DC
  Administered 2023-12-26 – 2023-12-29 (×8): 1 via ORAL
  Filled 2023-12-26 (×10): qty 1

## 2023-12-26 MED ORDER — SODIUM CHLORIDE 0.9 % IV SOLN
2.0000 g | INTRAVENOUS | Status: DC
  Administered 2023-12-26 – 2023-12-29 (×4): 2 g via INTRAVENOUS
  Filled 2023-12-26 (×4): qty 20

## 2023-12-26 MED ORDER — MIRTAZAPINE 15 MG PO TABS
15.0000 mg | ORAL_TABLET | Freq: Every day | ORAL | Status: DC
  Administered 2023-12-26 – 2023-12-28 (×3): 15 mg via ORAL
  Filled 2023-12-26 (×3): qty 1

## 2023-12-26 MED ORDER — ACETAMINOPHEN 325 MG PO TABS: 650.0000 mg | ORAL_TABLET | Freq: Four times a day (QID) | ORAL | Status: DC | PRN

## 2023-12-26 MED ORDER — ACETAMINOPHEN 650 MG RE SUPP: 650.0000 mg | Freq: Four times a day (QID) | RECTAL | Status: DC | PRN

## 2023-12-26 MED ORDER — DONEPEZIL HCL 10 MG PO TABS
10.0000 mg | ORAL_TABLET | Freq: Every day | ORAL | Status: DC
  Administered 2023-12-26 – 2023-12-28 (×3): 10 mg via ORAL
  Filled 2023-12-26 (×3): qty 1

## 2023-12-26 MED ORDER — ATORVASTATIN CALCIUM 40 MG PO TABS
40.0000 mg | ORAL_TABLET | Freq: Every evening | ORAL | Status: DC
  Administered 2023-12-26 – 2023-12-28 (×3): 40 mg via ORAL
  Filled 2023-12-26 (×3): qty 1

## 2023-12-26 MED ORDER — MEMANTINE HCL 10 MG PO TABS
10.0000 mg | ORAL_TABLET | Freq: Two times a day (BID) | ORAL | Status: DC
  Administered 2023-12-26 – 2023-12-29 (×8): 10 mg via ORAL
  Filled 2023-12-26 (×8): qty 1

## 2023-12-26 MED ORDER — ONDANSETRON HCL 4 MG/2ML IJ SOLN
4.0000 mg | Freq: Four times a day (QID) | INTRAMUSCULAR | Status: DC | PRN
  Administered 2023-12-28: 4 mg via INTRAVENOUS

## 2023-12-26 MED ORDER — SODIUM CHLORIDE 0.9 % IV SOLN: INTRAVENOUS | Status: AC

## 2023-12-26 MED ORDER — LAMOTRIGINE 100 MG PO TABS
150.0000 mg | ORAL_TABLET | Freq: Two times a day (BID) | ORAL | Status: DC
  Administered 2023-12-26 – 2023-12-29 (×8): 150 mg via ORAL
  Filled 2023-12-26 (×8): qty 2

## 2023-12-26 MED ORDER — ONDANSETRON HCL 4 MG PO TABS
4.0000 mg | ORAL_TABLET | Freq: Four times a day (QID) | ORAL | Status: DC | PRN
  Filled 2023-12-26: qty 1

## 2023-12-26 MED ORDER — LISINOPRIL 20 MG PO TABS
20.0000 mg | ORAL_TABLET | Freq: Every evening | ORAL | Status: DC
  Administered 2023-12-26 – 2023-12-28 (×3): 20 mg via ORAL
  Filled 2023-12-26 (×3): qty 1

## 2023-12-26 NOTE — ED Notes (Signed)
 Still waiting on meds from pharmacy called [facility]

## 2023-12-26 NOTE — ED Notes (Signed)
 Called pt daughter per request to update on care plan

## 2023-12-26 NOTE — H&P (Signed)
 History and Physical    Patient: [name] [name] FMW:[id number] DOB: Oct 21, 1945 DOA: 12/25/2023 DOS: the patient was seen and examined on 12/26/2023 PCP: System, Provider Not In  Patient coming from: ALF/ILF  Chief Complaint: Agitation, fever      HPI:  78 y.o. M with dementia, lives at memory care, AF and SSS s/p PPM not on AC, Parkinson's disease, known cecal mass and lung nodules, hx CAD, TIA, HTN and Bipolar who presented with fall and agitation.  Caveat that patient has advanced dementia and is confused and all history collected from wife and EDP.  Evidently, patient was in his usual state of health (cooperative with cares, ambulates with assistance, disoriented, but can respond to questions) until yesterday, he had an unwitnessed fall, was seen in the ER with an eyebrow laceration of the left eye, glued and sent home.  Today he had worsening agitation, was uncooperative and seemed altered from his baseline, so linden placed on him to the ER.  In the ER, febrile to 102F, lactic acid 2.5, CT chest abdomen and pelvis showed known right upper lobe nodules and mesenteric mass as well as a new left lower lobe pneumonia.   Given antibiotics and hospitalist service asked to evaluate.       Review of Systems  Unable to perform ROS: Mental status change     Past Medical History:  Diagnosis Date   Alzheimer disease (HCC)    Anxiety    Arthritis    Atrial fibrillation (HCC)    Chronic   Bipolar disorder (HCC)    Chronic low back pain    Dementia (HCC)    Depression    Dizziness    Dysrhythmia    a fib   ED (erectile dysfunction)    External hemorrhoids    Fatigue    HLD (hyperlipidemia)    HTN (hypertension)    Hypogonadism in male    Lipoma of skin    Memory loss    Mixed Alzheimer's and vascular dementia (HCC)    Poor balance    Restless legs syndrome (RLS)    S/P placement of cardiac pacemaker 04/24/2023   Medtronic single lead PPM    Sleep apnea    cpap    Stroke [facility]) 1991   Tremor    Past Surgical History:  Procedure Laterality Date   COLONOSCOPY     KNEE ARTHROPLASTY Left 06/04/2017   Procedure: LEFT TOTAL KNEE ARTHROPLASTY WITH COMPUTER NAVIGATION;  Surgeon: Fidel Rogue, MD;  Location: [facility];  Service: Orthopedics;  Laterality: Left;  NEEDS RNFA   LOOP RECORDER INSERTION N/A 11/25/2021   Procedure: LOOP RECORDER INSERTION;  Surgeon: Fernande Elspeth BROCKS, MD;  Location: [facility] INVASIVE CV LAB;  Service: Cardiovascular;  Laterality: N/A;   PACEMAKER IMPLANT N/A 04/24/2023   Procedure: PACEMAKER IMPLANT;  Surgeon: Fernande Elspeth BROCKS, MD;  Location: [facility] INVASIVE CV LAB;  Service: Cardiovascular;  Laterality: N/A;   TONSILLECTOMY AND ADENOIDECTOMY     TOTAL HIP ARTHROPLASTY Right 07/31/2016   Procedure: RIGHT TOTAL HIP ARTHROPLASTY ANTERIOR APPROACH;  Surgeon: Fidel Rogue, MD;  Location: [facility];  Service: Orthopedics;  Laterality: Right;  Requesting RNFA   Social History:  reports that he has never smoked. He quit smokeless tobacco use about 55 years ago. He reports current alcohol  use of about 1.0 standard drink of alcohol  per week. He reports that he does not use drugs.  Allergies  Allergen Reactions   Latex     Family History  Problem Relation  Age of Onset   Suicidality Unknown    Cancer Unknown    Alcohol  abuse Unknown    Cancer Mother    Suicidality Father     Prior to Admission medications   Medication Sig Start Date End Date Taking? Authorizing Provider  acetaminophen  (TYLENOL ) 325 MG tablet Take 650 mg by mouth in the morning and at bedtime.    [provider]  ALPRAZolam SHEFFIELD) 1 MG tablet Take 1 mg by mouth 2 (two) times daily as needed for anxiety.    [provider]  atorvastatin  (LIPITOR) 40 MG tablet Take 1 tablet (40 mg total) by mouth every evening. Please call our office to schedule an yearly appointment with Dr. Court for November 2024 before anymore refills. [phone] 12/25/22   Court Dorn PARAS,  MD  carbidopa -levodopa  (SINEMET  IR) 25-250 MG tablet Take 1 tablet by mouth 2 (two) times daily. 11/25/22   [provider]  divalproex  (DEPAKOTE ) 125 MG DR tablet Take 250 mg by mouth 3 (three) times daily. 11/25/22   [provider]  donepezil  (ARICEPT ) 10 MG tablet Take 10 mg by mouth at bedtime. 11/25/22   [provider]  eszopiclone (LUNESTA) 2 MG TABS tablet Take 2 mg by mouth at bedtime. Take immediately before bedtime    [provider]  folic acid  (FOLVITE ) 1 MG tablet Take 1 mg by mouth at bedtime. 11/25/22   [provider]  lamoTRIgine  (LAMICTAL ) 150 MG tablet Take 150 mg by mouth 2 (two) times daily. 11/25/22   [provider]  lisinopril  (ZESTRIL ) 10 MG tablet Take 1 tablet (10 mg total) by mouth every evening. Please call our office to schedule an yearly appointment with Dr. Court for November 2024 before anymore refills. [phone]. Thank you 1st attempt Patient taking differently: Take 20 mg by mouth every evening. Please call our office to schedule an yearly appointment with Dr. Court for November 2024 before anymore refills. [phone]. Thank you 1st attempt 12/25/22   Court Dorn PARAS, MD  memantine  (NAMENDA ) 10 MG tablet Take 10 mg by mouth 2 (two) times daily. 11/25/22   [provider]  mirtazapine  (REMERON ) 15 MG tablet Take 15 mg by mouth at bedtime. 11/25/22   [provider]  REPATHA SURECLICK 140 MG/ML SOAJ Inject 140 mg into the skin every 14 (fourteen) days. 11/25/22   [provider]  torsemide  (DEMADEX ) 10 MG tablet Take 10 mg by mouth daily.    [provider]  ziprasidone  (GEODON ) 40 MG capsule Take 40 mg by mouth every evening. To be given in addition to 60mg  in the morning for a total daily dose of 100mg .    [provider]  ziprasidone  (GEODON ) 60 MG capsule Take 60 mg by mouth in the morning. To be given in addition to 40mg  in the evening for a total daily dose of  100mg .    [provider]    Physical Exam: Vitals:   12/25/23 1915 12/25/23 2123 12/25/23 2200 12/25/23 2219  BP: (!) 182/87 (!) 147/100 (!) 151/80   Pulse: 97 90 61   Resp: (!) 26 (!) 22 (!) 30   Temp:    98 F (36.7 C)  TempSrc:      SpO2: 98% 100% 96%    Thin elderly adult male, lying in bed, coughing, twisted in bed and trying to get out, does not seem aware of my presence Bilateral eyes appear normal, sclera anicteric, conjunctive pink, lids and lashes normal. Oropharynx dry, he  does not open his mouth but is lips are cracked and dry and the OP looks dry from the outside Tachycardic, irregular, no murmurs, no peripheral edema Respiratory rate seems increased, he is coughing, lung sounds are diminished bilaterally, he has no wheezing Abdomen soft, no grimace to palpation, no guarding or rigidity He does not cooperate with exam.  He is staring at the ceiling.  He has increased tone in all 4 extremities.  Groans occasionally but makes no intelligible utterances.        Data Reviewed: Basic metabolic panel shows normal electrolytes, renal function, and LFTs Lactic acidosis elevated but resolved with fluids CBC shows no leukocytosis, but does show mild anemia, mild thrombocytopenia COVID, flu, RSV negative Urinalysis rules out UTI CT chest abdomen and pelvis personally reviewed, shows left lower lobe pneumonia, old known right upper lobe nodules, known mesenteric mass, no other acute findings ECG, personally reviewed, shows A-fib   Assessment and Plan: * Sepsis (HCC) Sepsis with endorgan dysfunction, presented with fever to 102F, tachycardia, tachypnea, change in mentation, lactic acidosis.  Found to have left lower lobe pneumonia.  PSI 100, risk class IV. - Continue Rocephin and azithromycin - I-S and flutter as tolerated - Follow-up blood cultures   Acute metabolic encephalopathy At baseline the patient has advanced dementia, but is cooperative with cares.   Here he has decreased responsiveness, agitation.  Likely precipitated by sepsis. - Standard delirium precautions  RUL Lung nodules and cecal/mesenteric mass Followed by Dr. Annella.  Recently there was some plans to do imaging, but wife asked to hold off on this, and pursue primarily comfort approach. - Consult Palliative care  S/P placement of cardiac pacemaker    CAD (coronary artery disease) of artery bypass graft - Continue Lipitor - Continue lisinopril   Mixed Alzheimer's and vascular dementia (HCC) Bipolar disorder He is somewhat agitated here - Continue home Depakote , Aricept , Lamictal , memantine , mirtazapine , and Geodon   Parkinsonian features Used to follow with Dr. Lane from [facility] neurology.  Appears to be still on Sinemet  - Continue home Sinemet   Essential hypertension Blood pressure elevated - Continue lisinopril  - Hold torsemide   Chronic atrial fibrillation [facility]) Last electrophysiology note, and dispense report show that he is not on anticoagulation.  ECG shows rate controlled A-fib         Advance Care Planning: DNR, confirmed with wife  Consults: None  Family Communication: Wife by phone  Severity of Illness: The appropriate patient status for this patient is INPATIENT. Inpatient status is judged to be reasonable and necessary in order to provide the required intensity of service to ensure the patient's safety. The patient's presenting symptoms, physical exam findings, and initial radiographic and laboratory data in the context of their chronic comorbidities is felt to place them at high risk for further clinical deterioration. Furthermore, it is not anticipated that the patient will be medically stable for discharge from the hospital within 2 midnights of admission.   * I certify that at the point of admission it is my clinical judgment that the patient will require inpatient hospital care spanning beyond 2 midnights from the point of admission  due to high intensity of service, high risk for further deterioration and high frequency of surveillance required.*  Author: Lonni SHAUNNA Dalton, MD 12/26/2023 12:13 AM  For on call review [email or web address].

## 2023-12-26 NOTE — ED Notes (Signed)
 Pt re adjusted in bed to position of comfort

## 2023-12-26 NOTE — ED Notes (Signed)
 Change pt brief and adjusted in bed, assisted pt with feeding dinner

## 2023-12-26 NOTE — ED Notes (Signed)
 Re adjusted pt ion bed X 2 staff

## 2023-12-26 NOTE — Consult Note (Signed)
 Palliative Care Consult Note                                  Date: 12/26/2023   Patient Name: [name] [name]  DOB: 1945-04-18  MRN: [id number]  Age / Sex: 78 y.o., male  PCP: System, Provider Not In Referring Physician: Leotis Bogus, MD  Reason for Consultation: Establishing goals of care  HPI/Patient Profile: 78 y.o. male  with past medical history of CAD, stroke, HTN, bipolar disorder, atrial fibrillation, sick sinus syndrome s/p PPM, and dementia admitted on 12/25/2023 from SNF after a fall and with agitation.  He was febrile with lactic acidosis, CTA and PE showed right upper lobe nodules and mesenteric mass as well as left lower lobe pneumonia.  Patient was admitted for sepsis secondary to pneumonia.   Past Medical History:  Diagnosis Date   Alzheimer disease (HCC)    Anxiety    Arthritis    Atrial fibrillation (HCC)    Chronic   Bipolar disorder (HCC)    Chronic low back pain    Dementia (HCC)    Depression    Dizziness    Dysrhythmia    a fib   ED (erectile dysfunction)    External hemorrhoids    Fatigue    HLD (hyperlipidemia)    HTN (hypertension)    Hypogonadism in male    Lipoma of skin    Memory loss    Mixed Alzheimer's and vascular dementia (HCC)    Poor balance    Restless legs syndrome (RLS)    S/P placement of cardiac pacemaker 04/24/2023   Medtronic single lead PPM    Sleep apnea    cpap   Stroke (HCC) 1991   Tremor     Subjective:   I have reviewed medical records including EPIC notes, labs and imaging, received update from attending provider, assessed the patient and then spoke with the patient's wife Cadence Haslam to discuss diagnosis prognosis, GOC, EOL wishes, disposition and options.  I introduced Palliative Medicine as specialized medical care for people living with serious illness. It focuses on providing relief from symptoms and stress of a serious illness. The goal is to improve  quality of life for both the patient and the family.  Today's Discussion: Spoke to patient's wife [name] by phone. We discussed the patient's current illness and what it means in the larger context of patient's on-going co-morbidities.  Education provided on dementia as a chronic, progressive, and irreversible disease that is often accelerated and exacerbated by acute illnesses and hospitalizations. We discussed the known RUL lung nodule and cecal mass- and that these are not being worked up.  We discussed that the patient was recently moved from memory care to SNF due to increased care needs.  We discussed the patient's increased emergency department visits over the last month.  I shared my concern that the patient's reserve is small.  We discussed his failure to thrive.    We discussed advanced directives.  Patient has both a living will and HCPOA document.  His wife is listed as his proxy education officer, environmental.  Patient's wife confirms DNR status.  Encouraged  her to consider changing the patient's scope of care to DNI.  She plans to discuss this with her children.  We discussed the difference between an aggressive medical intervention path and a comfort focused path.  The patient's wife shared the nurse practitioner at [facility] discussed possibly starting hospice services. The patient's wife shared that at this time she would like to continue treating the treatable with the goal of discharging back to [facility].   The patient's wife has a archivist at [facility] to discuss scope of treatment moving forward.  Patient will likely receive hospice care at [facility] after discharge.  Before his dementia diagnosis the patient was energetic and vibrant. [name] shared the patient would not want to be in his current condition.  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  In retirement they enjoyed traveling including a  6-week vacation to [place].  The patient and his wife have 3 children and 3 grandchildren.  Emotional support and therapeutic listening provided. Discussed the importance of continued conversation with family and the medical providers regarding overall plan of care and treatment options, ensuring decisions are within the context of the patient's values and GOCs.  Questions and concerns were addressed. The family was encouraged to call with questions or concerns. PMT will continue to support holistically.  Review of Systems  Unable to perform ROS   Objective:   Primary Diagnoses: Present on Admission:  Sepsis (HCC)  Chronic atrial fibrillation (HCC)  Mixed Alzheimer's and vascular dementia (HCC)  Intermittent complete heart block (HCC)  S/P placement of cardiac pacemaker  Parkinsonian features  Essential hypertension  CAD (coronary artery disease) of artery bypass graft   Physical Exam Vitals reviewed.  Constitutional:      General: He is not in acute distress.    Appearance: He is ill-appearing.  Cardiovascular:     Rate and Rhythm: Normal rate.  Pulmonary:     Effort: Pulmonary effort is normal.  Skin:    General: Skin is dry.  Neurological:     Mental Status: He is alert. He is disoriented.  Psychiatric:        Behavior: Behavior is agitated.     Vital Signs:  BP (!) 169/77   Pulse 95   Temp 98.7 F (37.1 C)   Resp 18   SpO2 94%     Advanced Care Planning:   Existing Vynca/ACP Documentation: Living will and HCPOA  Primary Decision Maker: HCPOA  Code Status/Advance Care Planning: DNR   Assessment & Plan:   SUMMARY OF RECOMMENDATIONS   Continue DNR Continue treating the treatable  Discharge back to [facility] May transition to hospice once at [facility] Encouraged family to continue discussions re: goals of care Continued PMT support  Discussed with: bedside RN and Dr. Leotis  Time Total: 90 minutes    Thank you for allowing us  to  participate in the care of Spence Soberano Brauer PMT will continue to support holistically.   Signed by: Stephane Palin, NP Palliative Medicine Team  Team Phone # [phone] (Nights/Weekends)  12/26/2023, 12:33 PM

## 2023-12-26 NOTE — Progress Notes (Signed)
 PROGRESS NOTE    [name] [name]  FMW:[id number] DOB: August 03, 1945 DOA: 12/25/2023 PCP: System, Provider Not In   Brief Narrative:  This 78 y.o. Male with PMH significant for dementia, lives at memory care center, Atrial Fibrillation,  SSS s/p PPM,  not on AC, Parkinson's disease, known cecal mass and lung nodules, hx CAD, TIA, HTN and Bipolar who presented with fall and agitation.  Patient has advanced dementia and is confused, all the history obtained from patient's wife.  Patient had unwitnessed fall, He was seen in the ED with an eyebrow laceration of the left eye, which was glued and sent home.  Patient has worsening agitation after he went home, was uncooperative and seemed altered from his baseline.  Patient was sent back in the ED. He was febrile with lactic acidosis, CTA and PE showed right upper lobe nodules and mesenteric mass as well as left lower lobe pneumonia.  Patient was admitted for sepsis secondary to pneumonia.  Assessment & Plan:   Principal Problem:   Sepsis (HCC) Active Problems:   Chronic atrial fibrillation (HCC)   Essential hypertension   Parkinsonian features   Mixed Alzheimer's and vascular dementia (HCC)   History of bipolar disorder   Intermittent complete heart block (HCC)   CAD (coronary artery disease) of artery bypass graft   S/P placement of cardiac pacemaker   RUL Lung nodules and cecal/mesenteric mass   Acute metabolic encephalopathy  Sepsis secondary to pneumonia: Patient presented with fever 102F, tachycardia, tachypnea, change in mentation, lactic acidosis.   He is found to have left lower lobe pneumonia.  He remains on room air. Continue Rocephin and azithromycin I-S and flutter as tolerated. Follow-up blood cultures. Sepsis physiology improving.     Acute metabolic encephalopathy: At baseline the patient has advanced dementia, but is cooperative with care.   He presented with decreased responsiveness, agitation.  Likely precipitated by  sepsis. Standard delirium precautions.   RUL Lung nodules and Cecal / Mesenteric mass: He is being followed by Dr. Annella. Lately there was some plans to do imaging, but wife asked to hold off on this, and pursue primarily comfort approach. Consulted Palliative care, awaiting recommendation.   S/P placement of cardiac pacemaker: Stable. Outpatient follow up.     CAD (coronary artery disease) of artery bypass graft: Continue Lipitor, lisinopril .   Mixed Alzheimer's and vascular dementia (HCC) Bipolar disorder: He presented with agitation Likely due to sepsis. Continue home Depakote , Aricept , Lamictal , memantine , mirtazapine , and Geodon .   Parkinsonian features: Used to follow with Dr. Lane from [facility] neurology.  Appears to be still on Sinemet . Continue home Sinemet    Essential hypertension: Blood pressure elevated Continue lisinopril  Hold torsemide    Chronic atrial fibrillation [facility]): Last electrophysiology note, and dispense report show that he is not on anticoagulation.   ECG shows rate controlled A-fib    DVT prophylaxis: Lovenox  Code Status: DNR Family Communication: No family at bedside. Disposition Plan:    Status is: Inpatient Remains inpatient appropriate because: Severity of illness.   Consultants:  None  Procedures: CT A/P  Antimicrobials:  Anti-infectives (From admission, onward)    Start     Dose/Rate Route Frequency Ordered Stop   12/26/23 2200  azithromycin (ZITHROMAX) 500 mg in sodium chloride  0.9 % 250 mL IVPB        500 mg 250 mL/hr over 60 Minutes Intravenous Every 24 hours 12/26/23 0249 12/31/23 2159   12/26/23 1000  cefTRIAXone (ROCEPHIN) 2 g in sodium chloride  0.9 % 100 mL  IVPB        2 g 200 mL/hr over 30 Minutes Intravenous Every 24 hours 12/26/23 0249 12/31/23 0959   12/25/23 2145  cefTRIAXone (ROCEPHIN) 1 g in sodium chloride  0.9 % 100 mL IVPB        1 g 200 mL/hr over 30 Minutes Intravenous  Once 12/25/23 2138 12/25/23 2315    12/25/23 2145  azithromycin (ZITHROMAX) 500 mg in sodium chloride  0.9 % 250 mL IVPB        500 mg 250 mL/hr over 60 Minutes Intravenous  Once 12/25/23 2138 12/26/23 0001      Subjective: Patient was seen and examined at bedside.  Overnight events noted. Patient seems confused,  remains in soft mittens, He was responding appropriately.  Objective: Vitals:   12/25/23 2330 12/26/23 0219 12/26/23 0532 12/26/23 0740  BP: (!) 140/94 139/85 (!) 154/75 (!) 171/80  Pulse: 85 81 80 93  Resp: 19 20 18 18   Temp:  97.8 F (36.6 C) 98.7 F (37.1 C) 98.7 F (37.1 C)  TempSrc:  Oral Axillary   SpO2:  99% 98% 94%    Intake/Output Summary (Last 24 hours) at 12/26/2023 1049 Last data filed at 12/26/2023 9376 Gross per 24 hour  Intake 703.28 ml  Output --  Net 703.28 ml   There were no vitals filed for this visit.  Examination:  General exam: Appears calm and comfortable, severely deconditioned, not in any distress. Respiratory system: CTA Bilaterally. Respiratory effort normal.  RR 12 Cardiovascular system: S1 & S2 heard, RRR. No JVD, murmurs, rubs, gallops or clicks.  Gastrointestinal system: Abdomen is non distended, soft and non tender.  Normal bowel sounds heard. Central nervous system: Alert and oriented x 1 . No focal neurological deficits. Extremities: No edema, no cyanosis, no clubbing. Skin: No rashes, lesions or ulcers Psychiatry: Mood & affect appropriate.     Data Reviewed: I have personally reviewed following labs and imaging studies  CBC: Recent Labs  Lab 12/24/23 1529 12/25/23 1703 12/25/23 1713 12/26/23 0410  WBC 5.6 5.1  --  5.0  NEUTROABS 3.8 4.3  --   --   HGB 9.5* 10.8* 11.9* 10.3*  HCT 31.1* 34.7* 35.0* 32.6*  MCV 99.4 98.3  --  96.2  PLT 113* 119*  --  106*   Basic Metabolic Panel: Recent Labs  Lab 12/24/23 1529 12/25/23 1703 12/25/23 1713 12/26/23 0410  NA 139 136 137 135  K 4.4 4.4 4.6 4.1  CL 101 100 102 100  CO2 30 25  --  25  GLUCOSE  91 86 88 97  BUN 16 16 19 14   CREATININE 1.20 1.01 1.10 1.03  CALCIUM  9.5 9.6  --  9.7   GFR: Estimated Creatinine Clearance: 60.9 mL/min (by C-G formula based on SCr of 1.03 mg/dL). Liver Function Tests: Recent Labs  Lab 12/24/23 1529 12/25/23 1703  AST 21 24  ALT 6 16  ALKPHOS 92 107  BILITOT 0.4 0.6  PROT 6.3* 7.3  ALBUMIN 3.1* 3.5   No results for input(s): LIPASE, AMYLASE in the last 168 hours. No results for input(s): AMMONIA in the last 168 hours. Coagulation Profile: No results for input(s): INR, PROTIME in the last 168 hours. Cardiac Enzymes: Recent Labs  Lab 12/24/23 1529  CKTOTAL 64   BNP (last 3 results) No results for input(s): PROBNP in the last 8760 hours. HbA1C: No results for input(s): HGBA1C in the last 72 hours. CBG: Recent Labs  Lab 12/25/23 1739  GLUCAP 85  Lipid Profile: No results for input(s): CHOL, HDL, LDLCALC, TRIG, CHOLHDL, LDLDIRECT in the last 72 hours. Thyroid  Function Tests: No results for input(s): TSH, T4TOTAL, FREET4, T3FREE, THYROIDAB in the last 72 hours. Anemia Panel: No results for input(s): VITAMINB12, FOLATE, FERRITIN, TIBC, IRON, RETICCTPCT in the last 72 hours. Sepsis Labs: Recent Labs  Lab 12/25/23 1710 12/25/23 1907  LATICACIDVEN 2.5* 1.4    Recent Results (from the past 240 hours)  Resp panel by RT-PCR (RSV, Flu A&B, Covid) Anterior Nasal Swab     Status: None   Collection Time: 12/25/23  8:33 PM   Specimen: Anterior Nasal Swab  Result Value Ref Range Status   SARS Coronavirus 2 by RT PCR NEGATIVE NEGATIVE Final   Influenza A by PCR NEGATIVE NEGATIVE Final   Influenza B by PCR NEGATIVE NEGATIVE Final    Comment: (NOTE) The Xpert Xpress SARS-CoV-2/FLU/RSV plus assay is intended as an aid in the diagnosis of influenza from Nasopharyngeal swab specimens and should not be used as a sole basis for treatment. Nasal washings and aspirates are unacceptable for Xpert  Xpress SARS-CoV-2/FLU/RSV testing.  Fact Sheet for Patients: bloggercourse.com  Fact Sheet for Healthcare Providers: seriousbroker.it  This test is not yet approved or cleared by the [place]  FDA and has been authorized for detection and/or diagnosis of SARS-CoV-2 by FDA under an Emergency Use Authorization (EUA). This EUA will remain in effect (meaning this test can be used) for the duration of the COVID-19 declaration under Section 564(b)(1) of the Act, 21 U.S.C. section 360bbb-3(b)(1), unless the authorization is terminated or revoked.     Resp Syncytial Virus by PCR NEGATIVE NEGATIVE Final    Comment: (NOTE) Fact Sheet for Patients: bloggercourse.com  Fact Sheet for Healthcare Providers: seriousbroker.it  This test is not yet approved or cleared by the [place]  FDA and has been authorized for detection and/or diagnosis of SARS-CoV-2 by FDA under an Emergency Use Authorization (EUA). This EUA will remain in effect (meaning this test can be used) for the duration of the COVID-19 declaration under Section 564(b)(1) of the Act, 21 U.S.C. section 360bbb-3(b)(1), unless the authorization is terminated or revoked.  Performed at [facility], [address] [place], [place] [address]   Culture, blood (routine x 2)     Status: None (Preliminary result)   Collection Time: 12/25/23 10:19 PM   Specimen: BLOOD RIGHT ARM  Result Value Ref Range Status   Specimen Description BLOOD RIGHT ARM  Final   Special Requests   Final    BOTTLES DRAWN AEROBIC AND ANAEROBIC Blood Culture adequate volume   Culture   Final    NO GROWTH < 12 HOURS Performed at [facility], [address] [place], [place] [address]    Report Status PENDING  Incomplete  Culture, blood (routine x 2)     Status: None (Preliminary result)   Collection Time: 12/25/23 10:19 PM   Specimen: BLOOD  LEFT ARM  Result Value Ref Range Status   Specimen Description BLOOD LEFT ARM  Final   Special Requests   Final    BOTTLES DRAWN AEROBIC AND ANAEROBIC Blood Culture adequate volume   Culture   Final    NO GROWTH < 12 HOURS Performed at [facility], [address], [place], [place] [address]    Report Status PENDING  Incomplete    Radiology Studies: CT CHEST ABDOMEN PELVIS W CONTRAST Result Date: 12/25/2023 EXAM: CT CHEST, ABDOMEN AND PELVIS WITH CONTRAST 12/25/2023 08:56:29  PM TECHNIQUE: CT of the chest, abdomen and pelvis was performed with the administration of 75 mL of iohexol  (OMNIPAQUE ) 350 MG/ML injection. Multiplanar reformatted images are provided for review. Automated exposure control, iterative reconstruction, and/or weight based adjustment of the mA/kV was utilized to reduce the radiation dose to as low as reasonably achievable. COMPARISON: 01/09/2023 CLINICAL HISTORY: Sepsis. FINDINGS: LIMITATIONS: Motion degraded images. CHEST: MEDIASTINUM AND LYMPH NODES: Mild cardiomegaly. Postoperative clavicle pacemaker. Calcified subcarinal node, chronic. No mediastinal, hilar or axillary lymphadenopathy. LUNGS AND PLEURA: Small bilateral pleural effusions. Mild patchy left lower lobe opacity with additional faint ground glass nodular opacities in the right upper lobe, suggesting multifocal pneumonia. Mild compressive atelectasis in the right lower lobe. ABDOMEN AND PELVIS: LIVER: The liver is unremarkable. GALLBLADDER AND BILE DUCTS: Gallbladder is unremarkable. No biliary ductal dilatation. SPLEEN: Calcified splenic granulomata. PANCREAS: No acute abnormality. ADRENAL GLANDS: No acute abnormality. KIDNEYS, URETERS AND BLADDER: 3.7 cm simple right upper pole renal cyst (image 72), benign (Bosniak 1). No follow-up is recommended. No stones in the kidneys or ureters. No hydronephrosis. No perinephric or periureteral stranding. Urinary bladder is unremarkable. GI AND BOWEL: Stomach demonstrates  no acute abnormality. There is no bowel obstruction. 2.5 cm enhancing polypoid lesion in the distal ileum (image 88), chronic but better visualized on the current study, favoring ileal carcinoid. Mild cervical stool burden. REPRODUCTIVE ORGANS: The prostate is unremarkable. PERITONEUM AND RETROPERITONEUM: No ascites. No free air. 2.3 x 3.3 cm partially calcified soft tissue lesion in the right lower quadrant mesentery (image 83), grossly unchanged, favoring mesenteric carcinoid. VASCULATURE: Aorta is normal in caliber. Atherosclerotic calcifications of the abdominal aorta and branch vessels, although patent. ABDOMINAL AND PELVIS LYMPH NODES: No lymphadenopathy. BONES AND SOFT TISSUES: Right hip arthroplasty. Mild degenerative changes of the left hip. Moderate degenerative changes of the visualized thoracolumbar spine. Mild diastasis of the midline anterior abdominal wall. No acute osseous abnormality. No focal soft tissue abnormality. IMPRESSION: 1. Motion degraded images, limiting evaluation. 2. Multifocal pneumonia, left lower lobe predominant. Small bilateral pleural effusions. 3. 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 Electronically signed by: Pinkie Pebbles MD 12/25/2023 09:05 PM EDT RP Workstation: [id number]   DG Chest Port 1 View Result Date: 12/25/2023 CLINICAL DATA:  [id number] AMS (altered mental status) [id number] EXAM: PORTABLE CHEST - 1 VIEW COMPARISON:  12/24/2023 FINDINGS: Low lung volumes. No focal airspace consolidation, pleural effusion, or pneumothorax. Moderate cardiomegaly. Cardiac loop recorder device. Left chest pacemaker with a single lead terminating in the right ventricle. Tortuous aorta with aortic atherosclerosis. No acute fracture or destructive lesions. Multilevel thoracic osteophytosis. IMPRESSION: Low lung volumes.  Otherwise, no acute cardiopulmonary abnormality. Electronically Signed   By: Rogelia Myers M.D.   On: 12/25/2023 18:49   CT  Head Wo Contrast Result Date: 12/25/2023 CLINICAL DATA:  Blunt trauma, fall yesterday. EXAM: CT HEAD WITHOUT CONTRAST TECHNIQUE: Contiguous axial images were obtained from the base of the skull through the vertex without intravenous contrast. RADIATION DOSE REDUCTION: This exam was performed according to the departmental dose-optimization program which includes automated exposure control, adjustment of the mA and/or kV according to patient size and/or use of iterative reconstruction technique. COMPARISON:  Head CT yesterday FINDINGS: Brain: No acute intracranial hemorrhage. No subdural or extra-axial collection. Stable degree of atrophy and chronic small vessel ischemia. Encephalomalacia in the right MCA distribution, unchanged from yesterday's exam. No evidence of new ischemia. No midline shift or mass lesion/mass effect. Vascular: No hyperdense vessel or unexpected calcification. Skull: No fracture  or focal lesion. Sinuses/Orbits: No acute finding. Other: No large scalp hematoma. IMPRESSION: 1. No acute intracranial abnormality. No skull fracture. 2. Stable chronic change. Electronically Signed   By: Andrea Gasman M.D.   On: 12/25/2023 17:45   CT Head Wo Contrast Result Date: 12/24/2023 CLINICAL DATA:  Ataxia, head trauma EXAM: CT HEAD WITHOUT CONTRAST TECHNIQUE: Contiguous axial images were obtained from the base of the skull through the vertex without intravenous contrast. RADIATION DOSE REDUCTION: This exam was performed according to the departmental dose-optimization program which includes automated exposure control, adjustment of the mA and/or kV according to patient size and/or use of iterative reconstruction technique. COMPARISON:  01/09/2023 FINDINGS: Brain: No acute infarct or hemorrhage. Chronic small vessel ischemic changes are again seen within the right MCA distribution and bilateral periventricular white matter. Lateral ventricles and midline structures are otherwise unremarkable. No acute  extra-axial fluid collections. No mass effect. Vascular: No hyperdense vessel or unexpected calcification. Skull: Normal. Negative for fracture or focal lesion. Sinuses/Orbits: No acute finding. Other: None. IMPRESSION: 1. No acute intracranial process. 2. Stable chronic ischemic changes as above. Electronically Signed   By: Ozell Daring M.D.   On: 12/24/2023 16:14   CT Cervical Spine Wo Contrast Result Date: 12/24/2023 CLINICAL DATA:  Cervical trauma, ataxia EXAM: CT CERVICAL SPINE WITHOUT CONTRAST TECHNIQUE: Multidetector CT imaging of the cervical spine was performed without intravenous contrast. Multiplanar CT image reconstructions were also generated. RADIATION DOSE REDUCTION: This exam was performed according to the departmental dose-optimization program which includes automated exposure control, adjustment of the mA and/or kV according to patient size and/or use of iterative reconstruction technique. COMPARISON:  01/09/2023 FINDINGS: Alignment: Alignment is grossly anatomic. Skull base and vertebrae: No acute fracture. No primary bone lesion or focal pathologic process. Soft tissues and spinal canal: No prevertebral fluid or swelling. No visible canal hematoma. Disc levels: There is bony ankylosis between the right lateral mass of C1 and the C2 vertebral body. Bony fusion across the left C2-3 facets and right C3-4 facets. There is progressive hypertrophic change at the C1-C2 interface. Stable diffuse cervical spondylosis and facet hypertrophy, with flowing anterior osteophytes throughout the entirety of the cervicothoracic spine. Upper chest: Airway is patent. Visualized portions of the lung apices are clear. Other: Reconstructed images demonstrate no additional findings. IMPRESSION: 1. No acute cervical spine fracture. 2. Progressive multilevel cervical degenerative changes as above. Electronically Signed   By: Ozell Daring M.D.   On: 12/24/2023 16:13   DG Pelvis 1-2 Views Result Date:  12/24/2023 CLINICAL DATA:  Un witnessed fall EXAM: PELVIS - 1-2 VIEW COMPARISON:  01/09/2023 FINDINGS: Single frontal view of the pelvis includes both hips. Stable right hip arthroplasty. No acute fracture, subluxation, or dislocation. Stable left hip osteoarthritis. Stable multilevel lower lumbar spondylosis and facet hypertrophy. IMPRESSION: 1. No acute displaced fracture. 2. Stable right hip arthroplasty. 3. Stable degenerative changes of the lumbar spine and left hip. Electronically Signed   By: Ozell Daring M.D.   On: 12/24/2023 15:46   DG Chest Portable 1 View Result Date: 12/24/2023 CLINICAL DATA:  Un witnessed fall EXAM: PORTABLE CHEST 1 VIEW COMPARISON:  11/09/2023 FINDINGS: Single frontal view of the chest demonstrates stable enlargement of the cardiac silhouette. Single lead pacer and loop recorder again noted. Persistent retrocardiac opacity consistent with left lower lobe consolidation, likely atelectasis. No other areas of consolidation, effusion, or pneumothorax. Mild vascular congestion. No acute displaced fractures. IMPRESSION: 1. Persistent retrocardiac opacification, favor atelectasis over airspace disease. 2. Stable enlarged  cardiac silhouette. Electronically Signed   By: Ozell Daring M.D.   On: 12/24/2023 15:46   Scheduled Meds:  atorvastatin   40 mg Oral QPM   carbidopa -levodopa   1 tablet Oral BID   divalproex   250 mg Oral TID   donepezil   10 mg Oral QHS   enoxaparin  (LOVENOX ) injection  40 mg Subcutaneous Q24H   lamoTRIgine   150 mg Oral BID   lisinopril   20 mg Oral QPM   memantine   10 mg Oral BID   mirtazapine   15 mg Oral QHS   ziprasidone   40 mg Oral QPM   ziprasidone   60 mg Oral q AM   Continuous Infusions:  sodium chloride  100 mL/hr at 12/26/23 9376   azithromycin     cefTRIAXone (ROCEPHIN)  IV 2 g (12/26/23 1009)     LOS: 1 day    Time spent: 50 mins    Darcel Dawley, MD [facility]   If 7PM-7AM, please contact night-coverage

## 2023-12-27 DIAGNOSIS — R6520 Severe sepsis without septic shock: Secondary | ICD-10-CM | POA: Diagnosis not present

## 2023-12-27 DIAGNOSIS — G9341 Metabolic encephalopathy: Secondary | ICD-10-CM | POA: Diagnosis not present

## 2023-12-27 DIAGNOSIS — A419 Sepsis, unspecified organism: Secondary | ICD-10-CM | POA: Diagnosis not present

## 2023-12-27 DIAGNOSIS — Z515 Encounter for palliative care: Secondary | ICD-10-CM | POA: Diagnosis not present

## 2023-12-27 DIAGNOSIS — J188 Other pneumonia, unspecified organism: Secondary | ICD-10-CM | POA: Diagnosis not present

## 2023-12-27 DIAGNOSIS — Z7189 Other specified counseling: Secondary | ICD-10-CM | POA: Diagnosis not present

## 2023-12-27 LAB — MRSA NEXT GEN BY PCR, NASAL: MRSA by PCR Next Gen: NOT DETECTED

## 2023-12-27 NOTE — Progress Notes (Signed)
 PROGRESS NOTE    [name] [name]  FMW:[id number] DOB: [birth year] DOA: 12/25/2023 PCP: System, Provider Not In   Brief Narrative:  This 78 y.o. Male with PMH significant for dementia, lives at memory care center, Atrial Fibrillation,  SSS s/p PPM,  not on AC, Parkinson's disease, known cecal mass and lung nodules, hx CAD, TIA, HTN and Bipolar who presented with fall and agitation.  Patient has advanced dementia and is confused, all the history obtained from patient's wife.  Patient had unwitnessed fall, He was seen in the ED with an eyebrow laceration of the left eye, which was glued and sent home.  Patient has worsening agitation after he went home, was uncooperative and seemed altered from his baseline.  Patient was sent back in the ED. He was febrile with lactic acidosis, CTA and PE showed right upper lobe nodules and mesenteric mass as well as left lower lobe pneumonia.  Patient was admitted for sepsis secondary to pneumonia.  Assessment & Plan:   Principal Problem:   Sepsis (HCC) Active Problems:   Chronic atrial fibrillation (HCC)   Essential hypertension   Parkinsonian features   Mixed Alzheimer's and vascular dementia (HCC)   History of bipolar disorder   Intermittent complete heart block (HCC)   CAD (coronary artery disease) of artery bypass graft   S/P placement of cardiac pacemaker   RUL Lung nodules and cecal/mesenteric mass   Acute metabolic encephalopathy  Sepsis secondary to pneumonia: Patient presented with fever 102F, tachycardia, tachypnea, change in mentation, lactic acidosis.   He is found to have left lower lobe pneumonia.  He remains on room air. Continue Rocephin and azithromycin I-S and flutter as tolerated. Follow-up blood cultures. Sepsis physiology improving.     Acute metabolic encephalopathy: At baseline the patient has advanced dementia, but is cooperative with care.   He presented with decreased responsiveness, agitation.  Likely precipitated by  sepsis. Standard delirium precautions.   RUL Lung nodules and Cecal / Mesenteric mass: He is being followed by Dr. Annella. Lately there was some plans to do imaging, but wife asked to hold off on this, and pursue primarily comfort approach. Consulted Palliative care, awaiting recommendation.   S/P placement of cardiac pacemaker: Stable. Outpatient follow up.     CAD (coronary artery disease) of artery bypass graft: Continue Lipitor, lisinopril .   Mixed Alzheimer's and vascular dementia (HCC) Bipolar disorder: He presented with agitation Likely due to sepsis. Continue home Depakote , Aricept , Lamictal , memantine , mirtazapine , and Geodon .   Parkinsonian features: Used to follow with Dr. Lane from [facility] neurology.  Appears to be still on Sinemet . Continue home Sinemet    Essential hypertension: Blood pressure elevated. Continue lisinopril  Hold torsemide    Chronic atrial fibrillation [facility]): Last electrophysiology note, and dispense report show that he is not on anticoagulation.   ECG shows rate controlled A-fib    DVT prophylaxis: Lovenox  Code Status: DNR Family Communication: No family at bedside. Disposition Plan:    Status is: Inpatient Remains inpatient appropriate because: Severity of illness.  Palliative care and conversations with the family about goals of care discussion.  Family wants to treat the treatable at this point.   Consultants:  Palliative care  Procedures: CT A/P  Antimicrobials:  Anti-infectives (From admission, onward)    Start     Dose/Rate Route Frequency Ordered Stop   12/26/23 2200  azithromycin (ZITHROMAX) 500 mg in sodium chloride  0.9 % 250 mL IVPB        500 mg 250 mL/hr over 60 Minutes  Intravenous Every 24 hours 12/26/23 0249 12/31/23 2159   12/26/23 1000  cefTRIAXone (ROCEPHIN) 2 g in sodium chloride  0.9 % 100 mL IVPB        2 g 200 mL/hr over 30 Minutes Intravenous Every 24 hours 12/26/23 0249 12/31/23 0959   12/25/23 2145   cefTRIAXone (ROCEPHIN) 1 g in sodium chloride  0.9 % 100 mL IVPB        1 g 200 mL/hr over 30 Minutes Intravenous  Once 12/25/23 2138 12/25/23 2315   12/25/23 2145  azithromycin (ZITHROMAX) 500 mg in sodium chloride  0.9 % 250 mL IVPB        500 mg 250 mL/hr over 60 Minutes Intravenous  Once 12/25/23 2138 12/26/23 0001      Subjective: Patient was seen and examined at bedside.Overnight events noted. Patient seems confused, remains in soft restraints.  He has eaten breakfast with the nurse.   Objective: Vitals:   12/26/23 2156 12/26/23 2212 12/27/23 0429 12/27/23 0755  BP:  (!) 162/66 (!) 177/91 (!) 151/74  Pulse: 74 83 72 74  Resp: 17 20 16 18   Temp: 97.6 F (36.4 C) 97.8 F (36.6 C) 98.2 F (36.8 C) 98 F (36.7 C)  TempSrc: Oral  Oral Oral  SpO2: 97% 94% 95%   Weight:   75.6 kg   Height:   5' 11 (1.803 m)     Intake/Output Summary (Last 24 hours) at 12/27/2023 1147 Last data filed at 12/27/2023 0830 Gross per 24 hour  Intake 410 ml  Output 150 ml  Net 260 ml   Filed Weights   12/27/23 0429  Weight: 75.6 kg    Examination:  General exam: Appears calm and comfortable, severely deconditioned, not in any distress. Respiratory system: CTA Bilaterally. Respiratory effort normal.  RR 12 Cardiovascular system: S1 & S2 heard, RRR. No JVD, murmurs, rubs, gallops or clicks.  Gastrointestinal system: Abdomen is non distended, soft and non tender.  Normal bowel sounds heard. Central nervous system: Arousable, nonverbal. No focal neurological deficits. Extremities: No edema, no cyanosis, no clubbing. Skin: No rashes, lesions or ulcers Psychiatry: Mood & affect appropriate.     Data Reviewed: I have personally reviewed following labs and imaging studies  CBC: Recent Labs  Lab 12/24/23 1529 12/25/23 1703 12/25/23 1713 12/26/23 0410  WBC 5.6 5.1  --  5.0  NEUTROABS 3.8 4.3  --   --   HGB 9.5* 10.8* 11.9* 10.3*  HCT 31.1* 34.7* 35.0* 32.6*  MCV 99.4 98.3  --  96.2   PLT 113* 119*  --  106*   Basic Metabolic Panel: Recent Labs  Lab 12/24/23 1529 12/25/23 1703 12/25/23 1713 12/26/23 0410  NA 139 136 137 135  K 4.4 4.4 4.6 4.1  CL 101 100 102 100  CO2 30 25  --  25  GLUCOSE 91 86 88 97  BUN 16 16 19 14   CREATININE 1.20 1.01 1.10 1.03  CALCIUM  9.5 9.6  --  9.7   GFR: Estimated Creatinine Clearance: 60.9 mL/min (by C-G formula based on SCr of 1.03 mg/dL). Liver Function Tests: Recent Labs  Lab 12/24/23 1529 12/25/23 1703  AST 21 24  ALT 6 16  ALKPHOS 92 107  BILITOT 0.4 0.6  PROT 6.3* 7.3  ALBUMIN 3.1* 3.5   No results for input(s): LIPASE, AMYLASE in the last 168 hours. No results for input(s): AMMONIA in the last 168 hours. Coagulation Profile: No results for input(s): INR, PROTIME in the last 168 hours. Cardiac Enzymes: Recent  Labs  Lab 12/24/23 1529  CKTOTAL 64   BNP (last 3 results) No results for input(s): PROBNP in the last 8760 hours. HbA1C: No results for input(s): HGBA1C in the last 72 hours. CBG: Recent Labs  Lab 12/25/23 1739  GLUCAP 85   Lipid Profile: No results for input(s): CHOL, HDL, LDLCALC, TRIG, CHOLHDL, LDLDIRECT in the last 72 hours. Thyroid  Function Tests: No results for input(s): TSH, T4TOTAL, FREET4, T3FREE, THYROIDAB in the last 72 hours. Anemia Panel: No results for input(s): VITAMINB12, FOLATE, FERRITIN, TIBC, IRON, RETICCTPCT in the last 72 hours. Sepsis Labs: Recent Labs  Lab 12/25/23 1710 12/25/23 1907  LATICACIDVEN 2.5* 1.4    Recent Results (from the past 240 hours)  Resp panel by RT-PCR (RSV, Flu A&B, Covid) Anterior Nasal Swab     Status: None   Collection Time: 12/25/23  8:33 PM   Specimen: Anterior Nasal Swab  Result Value Ref Range Status   SARS Coronavirus 2 by RT PCR NEGATIVE NEGATIVE Final   Influenza A by PCR NEGATIVE NEGATIVE Final   Influenza B by PCR NEGATIVE NEGATIVE Final    Comment: (NOTE) The Xpert Xpress  SARS-CoV-2/FLU/RSV plus assay is intended as an aid in the diagnosis of influenza from Nasopharyngeal swab specimens and should not be used as a sole basis for treatment. Nasal washings and aspirates are unacceptable for Xpert Xpress SARS-CoV-2/FLU/RSV testing.  Fact Sheet for Patients: bloggercourse.com  Fact Sheet for Healthcare Providers: seriousbroker.it  This test is not yet approved or cleared by the [place]  FDA and has been authorized for detection and/or diagnosis of SARS-CoV-2 by FDA under an Emergency Use Authorization (EUA). This EUA will remain in effect (meaning this test can be used) for the duration of the COVID-19 declaration under Section 564(b)(1) of the Act, 21 U.S.C. section 360bbb-3(b)(1), unless the authorization is terminated or revoked.     Resp Syncytial Virus by PCR NEGATIVE NEGATIVE Final    Comment: (NOTE) Fact Sheet for Patients: bloggercourse.com  Fact Sheet for Healthcare Providers: seriousbroker.it  This test is not yet approved or cleared by the [place]  FDA and has been authorized for detection and/or diagnosis of SARS-CoV-2 by FDA under an Emergency Use Authorization (EUA). This EUA will remain in effect (meaning this test can be used) for the duration of the COVID-19 declaration under Section 564(b)(1) of the Act, 21 U.S.C. section 360bbb-3(b)(1), unless the authorization is terminated or revoked.  Performed at [facility], [address], [place], [place] [address]   Culture, blood (routine x 2)     Status: None (Preliminary result)   Collection Time: 12/25/23 10:19 PM   Specimen: BLOOD RIGHT ARM  Result Value Ref Range Status   Specimen Description BLOOD RIGHT ARM  Final   Special Requests   Final    BOTTLES DRAWN AEROBIC AND ANAEROBIC Blood Culture adequate volume   Culture   Final    NO GROWTH 2 DAYS Performed at  [facility], [address], [place], [place] [address]    Report Status PENDING  Incomplete  Culture, blood (routine x 2)     Status: None (Preliminary result)   Collection Time: 12/25/23 10:19 PM   Specimen: BLOOD LEFT ARM  Result Value Ref Range Status   Specimen Description BLOOD LEFT ARM  Final   Special Requests   Final    BOTTLES DRAWN AEROBIC AND ANAEROBIC Blood Culture adequate volume   Culture   Final    NO GROWTH 2 DAYS  Performed at [facility], [address] [place], [place] [address]    Report Status PENDING  Incomplete  MRSA Next Gen by PCR, Nasal     Status: None   Collection Time: 12/27/23  6:31 AM   Specimen: Nasal Mucosa; Nasal Swab  Result Value Ref Range Status   MRSA by PCR Next Gen NOT DETECTED NOT DETECTED Final    Comment: (NOTE) The GeneXpert MRSA Assay (FDA approved for NASAL specimens only), is one component of a comprehensive MRSA colonization surveillance program. It is not intended to diagnose MRSA infection nor to guide or monitor treatment for MRSA infections. Test performance is not FDA approved in patients less than 10 years old. Performed at [facility], [address] [place], [place] [address]     Radiology Studies: CT CHEST ABDOMEN PELVIS W CONTRAST Result Date: 12/25/2023 EXAM: CT CHEST, ABDOMEN AND PELVIS WITH CONTRAST 12/25/2023 08:56:29 PM TECHNIQUE: CT of the chest, abdomen and pelvis was performed with the administration of 75 mL of iohexol  (OMNIPAQUE ) 350 MG/ML injection. Multiplanar reformatted images are provided for review. Automated exposure control, iterative reconstruction, and/or weight based adjustment of the mA/kV was utilized to reduce the radiation dose to as low as reasonably achievable. COMPARISON: 01/09/2023 CLINICAL HISTORY: Sepsis. FINDINGS: LIMITATIONS: Motion degraded images. CHEST: MEDIASTINUM AND LYMPH NODES: Mild cardiomegaly. Postoperative clavicle pacemaker. Calcified subcarinal node, chronic.  No mediastinal, hilar or axillary lymphadenopathy. LUNGS AND PLEURA: Small bilateral pleural effusions. Mild patchy left lower lobe opacity with additional faint ground glass nodular opacities in the right upper lobe, suggesting multifocal pneumonia. Mild compressive atelectasis in the right lower lobe. ABDOMEN AND PELVIS: LIVER: The liver is unremarkable. GALLBLADDER AND BILE DUCTS: Gallbladder is unremarkable. No biliary ductal dilatation. SPLEEN: Calcified splenic granulomata. PANCREAS: No acute abnormality. ADRENAL GLANDS: No acute abnormality. KIDNEYS, URETERS AND BLADDER: 3.7 cm simple right upper pole renal cyst (image 72), benign (Bosniak 1). No follow-up is recommended. No stones in the kidneys or ureters. No hydronephrosis. No perinephric or periureteral stranding. Urinary bladder is unremarkable. GI AND BOWEL: Stomach demonstrates no acute abnormality. There is no bowel obstruction. 2.5 cm enhancing polypoid lesion in the distal ileum (image 88), chronic but better visualized on the current study, favoring ileal carcinoid. Mild cervical stool burden. REPRODUCTIVE ORGANS: The prostate is unremarkable. PERITONEUM AND RETROPERITONEUM: No ascites. No free air. 2.3 x 3.3 cm partially calcified soft tissue lesion in the right lower quadrant mesentery (image 83), grossly unchanged, favoring mesenteric carcinoid. VASCULATURE: Aorta is normal in caliber. Atherosclerotic calcifications of the abdominal aorta and branch vessels, although patent. ABDOMINAL AND PELVIS LYMPH NODES: No lymphadenopathy. BONES AND SOFT TISSUES: Right hip arthroplasty. Mild degenerative changes of the left hip. Moderate degenerative changes of the visualized thoracolumbar spine. Mild diastasis of the midline anterior abdominal wall. No acute osseous abnormality. No focal soft tissue abnormality. IMPRESSION: 1. Motion degraded images, limiting evaluation. 2. Multifocal pneumonia, left lower lobe predominant. Small bilateral pleural  effusions. 3. 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 Electronically signed by: Pinkie Pebbles MD 12/25/2023 09:05 PM EDT RP Workstation: [id number]   DG Chest Port 1 View Result Date: 12/25/2023 CLINICAL DATA:  [id number] AMS (altered mental status) [id number] EXAM: PORTABLE CHEST - 1 VIEW COMPARISON:  12/24/2023 FINDINGS: Low lung volumes. No focal airspace consolidation, pleural effusion, or pneumothorax. Moderate cardiomegaly. Cardiac loop recorder device. Left chest pacemaker with a single lead terminating in the right ventricle. Tortuous aorta with aortic atherosclerosis. No acute fracture  or destructive lesions. Multilevel thoracic osteophytosis. IMPRESSION: Low lung volumes.  Otherwise, no acute cardiopulmonary abnormality. Electronically Signed   By: Rogelia Myers M.D.   On: 12/25/2023 18:49   CT Head Wo Contrast Result Date: 12/25/2023 CLINICAL DATA:  Blunt trauma, fall yesterday. EXAM: CT HEAD WITHOUT CONTRAST TECHNIQUE: Contiguous axial images were obtained from the base of the skull through the vertex without intravenous contrast. RADIATION DOSE REDUCTION: This exam was performed according to the departmental dose-optimization program which includes automated exposure control, adjustment of the mA and/or kV according to patient size and/or use of iterative reconstruction technique. COMPARISON:  Head CT yesterday FINDINGS: Brain: No acute intracranial hemorrhage. No subdural or extra-axial collection. Stable degree of atrophy and chronic small vessel ischemia. Encephalomalacia in the right MCA distribution, unchanged from yesterday's exam. No evidence of new ischemia. No midline shift or mass lesion/mass effect. Vascular: No hyperdense vessel or unexpected calcification. Skull: No fracture or focal lesion. Sinuses/Orbits: No acute finding. Other: No large scalp hematoma. IMPRESSION: 1. No acute intracranial abnormality. No skull fracture. 2. Stable chronic  change. Electronically Signed   By: Andrea Gasman M.D.   On: 12/25/2023 17:45   Scheduled Meds:  atorvastatin   40 mg Oral QPM   carbidopa -levodopa   1 tablet Oral BID   divalproex   250 mg Oral TID   donepezil   10 mg Oral QHS   enoxaparin  (LOVENOX ) injection  40 mg Subcutaneous Q24H   lamoTRIgine   150 mg Oral BID   lisinopril   20 mg Oral QPM   memantine   10 mg Oral BID   mirtazapine   15 mg Oral QHS   ziprasidone   40 mg Oral QPM   ziprasidone   60 mg Oral q AM   Continuous Infusions:  azithromycin 500 mg (12/26/23 2330)   cefTRIAXone (ROCEPHIN)  IV 2 g (12/27/23 0844)     LOS: 2 days    Time spent: 35 mins    Darcel Dawley, MD [facility]   If 7PM-7AM, please contact night-coverage

## 2023-12-27 NOTE — Progress Notes (Addendum)
 Daily Progress Note   Patient Name: [name] [name]       Date: 12/27/2023 DOB: 12-24-1945  Age: 78 y.o. MRN#: [id number] Attending Physician: Leotis Bogus, MD Primary Care Physician: System, Provider Not In Admit Date: 12/25/2023  Reason for Consultation/Follow-up: Establishing goals of care   Length of Stay: 2  Current Medications: Scheduled Meds:   atorvastatin   40 mg Oral QPM   carbidopa -levodopa   1 tablet Oral BID   divalproex   250 mg Oral TID   donepezil   10 mg Oral QHS   enoxaparin  (LOVENOX ) injection  40 mg Subcutaneous Q24H   lamoTRIgine   150 mg Oral BID   lisinopril   20 mg Oral QPM   memantine   10 mg Oral BID   mirtazapine   15 mg Oral QHS   ziprasidone   40 mg Oral QPM   ziprasidone   60 mg Oral q AM    Continuous Infusions:  azithromycin 500 mg (12/26/23 2330)   cefTRIAXone (ROCEPHIN)  IV 2 g (12/27/23 0844)    PRN Meds: acetaminophen  **OR** acetaminophen , ondansetron  **OR** ondansetron  (ZOFRAN ) IV  Physical Exam Constitutional:      General: He is sleeping. He is not in acute distress.    Appearance: He is ill-appearing.     Comments: Soft mitts  Cardiovascular:     Rate and Rhythm: Normal rate.  Pulmonary:     Effort: Pulmonary effort is normal.  Skin:    General: Skin is warm and dry.  Neurological:     Mental Status: He is easily aroused. He is disoriented.  Psychiatric:        Behavior: Behavior normal.             Vital Signs: BP (!) 151/74 (BP Location: Right Arm)   Pulse 74   Temp 98 F (36.7 C) (Oral)   Resp 18   Ht 5' 11 (1.803 m)   Wt 75.6 kg   SpO2 95%   BMI 23.25 kg/m  SpO2: SpO2: 95 % O2 Device: O2 Device: Room Air O2 Flow Rate:      Patient Active Problem List   Diagnosis Date Noted   Sepsis (HCC) 12/25/2023   RUL  Lung nodules and cecal/mesenteric mass 12/25/2023   Acute metabolic encephalopathy 12/25/2023   Bradycardia 04/25/2023   S/P placement of cardiac pacemaker 04/24/2023  Concussion 01/09/2023   OSA (obstructive sleep apnea) 01/09/2023   CAD (coronary artery disease) of artery bypass graft 01/09/2023   Dementia with behavioral disturbance (HCC) 01/09/2023   Atrial fibrillation (HCC) 01/09/2023   Chronic anticoagulation 01/09/2023   HTN (hypertension) 01/09/2023   Bipolar disorder (HCC) 01/09/2023   Dementia with behavioral disturbance (HCC) 01/04/2023   Fall at home, initial encounter 12/18/2022   Anemia of chronic disease 12/18/2022   Generalized weakness 12/17/2022   History of loop recorder - STJ 04/15/2022   Syncope, cardiogenic 11/24/2021   Intermittent complete heart block (HCC) 11/24/2021   Obstructive sleep apnea 02/14/2021   Urinary urgency 07/11/2020   History of total knee arthroplasty 12/16/2018   Osteoarthritis of left knee 06/04/2017   B12 deficiency 03/19/2017   Loss of memory 03/18/2017   Diplopia 08/06/2016   History of atrial fibrillation 08/06/2016   History of bipolar disorder 08/06/2016   History of restless legs syndrome 08/06/2016   Weakness 02/10/2016   Essential tremor 07/17/2015   Mixed Alzheimer's and vascular dementia (HCC) 10/28/2013   Parkinsonian features 03/31/2013   History of stroke 03/23/2013   Hyperlipidemia 03/20/2013   Brain TIA 03/18/2013   Gait instability 03/17/2013   Chronic atrial fibrillation (HCC) 03/17/2013   Essential hypertension 03/17/2013    Palliative Care Assessment & Plan   Patient Profile: 78 y.o. male  with past medical history of CAD, stroke, HTN, bipolar disorder, atrial fibrillation, sick sinus syndrome s/p PPM, and dementia admitted on 12/25/2023 from SNF after a fall and with agitation.   He was febrile with lactic acidosis, CTA and PE showed right upper lobe nodules and mesenteric mass as well as left lower lobe  pneumonia.  Patient was admitted for sepsis secondary to pneumonia.   Today's Discussion: Reviewed chart. Patient ate a few bites of breakfast. He remains on IV antibiotics. Goal remains to treat the treatable with discharge back to [facility].  Patient sleeping in NAD. He has soft mitts on his hands.   12:55:  Attempted to call patient's wife [name] Norris. Voice mailbox not set up.   1:25: Spoke to patient's wife by phone and then visited in patient's room. Patient was awake and greeted me. Discussed patient's hospitalization and plan to return to [facility]. Patient's wife and daughter are meeting at [facility] at 3 pm and will likely choose hospice services at discharge. Gave patient's wife Hard Choices booklet and Gone from my sight booklet. Encouraged her to discuss scopes of care with the children and encouraged her to consider DNI status.  Recommendations/Plan: Continue DNR Continue treating the treatable  Discharge back to [facility] May transition to hospice once at [facility] Encouraged family to continue discussions re: goals of care Continued PMT support    Code Status:    Code Status Orders  (From admission, onward)           Start     Ordered   12/25/23 2358  Do not attempt resuscitation (DNR) Pre-Arrest Interventions Desired  Continuous       Question Answer Comment  If pulseless and not breathing No CPR or chest compressions.   In Pre-Arrest Conditions (Patient Has Pulse and Is Breathing) May intubate, use advanced airway interventions and cardioversion/ACLS medications if appropriate or indicated. May transfer to ICU.   Consent: Discussion documented in EHR or advanced directives reviewed      12/25/23 2359         Extensive chart review has been completed prior to  seeing the patient including labs, vital signs, imaging, progress/consult notes, orders, medications, and available advance directive documents.  Time spent: 50 minutes  Thank you  for allowing the Palliative Medicine Team to assist in the care of this patient.   Stephane CHRISTELLA Palin, NP  Please contact Palliative Medicine Team phone at [phone] for questions and concerns.

## 2023-12-27 NOTE — Progress Notes (Signed)
 Not done other care.SABRASABRASABRA

## 2023-12-27 NOTE — Plan of Care (Signed)
?  Problem: Clinical Measurements: ?Goal: Ability to maintain a body temperature in the normal range will improve ?Outcome: Progressing ?  ?Problem: Respiratory: ?Goal: Ability to maintain adequate ventilation will improve ?Outcome: Progressing ?Goal: Ability to maintain a clear airway will improve ?Outcome: Progressing ?  ?Problem: Activity: ?Goal: Ability to tolerate increased activity will improve ?Outcome: Not Progressing ?  ?

## 2023-12-28 DIAGNOSIS — J188 Other pneumonia, unspecified organism: Secondary | ICD-10-CM

## 2023-12-28 DIAGNOSIS — Z515 Encounter for palliative care: Secondary | ICD-10-CM | POA: Diagnosis not present

## 2023-12-28 DIAGNOSIS — Z7189 Other specified counseling: Secondary | ICD-10-CM | POA: Diagnosis not present

## 2023-12-28 NOTE — Plan of Care (Signed)
?  Problem: Clinical Measurements: ?Goal: Ability to maintain a body temperature in the normal range will improve ?Outcome: Progressing ?  ?Problem: Respiratory: ?Goal: Ability to maintain adequate ventilation will improve ?Outcome: Progressing ?  ?Problem: Activity: ?Goal: Ability to tolerate increased activity will improve ?Outcome: Not Progressing ?  ?Problem: Respiratory: ?Goal: Ability to maintain a clear airway will improve ?Outcome: Not Progressing ?  ?

## 2023-12-28 NOTE — Progress Notes (Signed)
 PROGRESS NOTE    [name] [name]  FMW:[id number] DOB: 1945-09-06 DOA: 12/25/2023 PCP: System, Provider Not In   Brief Narrative:  This 78 y.o. Male with PMH significant for dementia, lives at memory care center, Atrial Fibrillation,  SSS s/p PPM,  not on AC, Parkinson's disease, known cecal mass and lung nodules, hx CAD, TIA, HTN and Bipolar who presented with fall and agitation.  Patient has advanced dementia and is confused, all the history obtained from patient's wife.  Patient had unwitnessed fall, He was seen in the ED with an eyebrow laceration of the left eye, which was glued and sent home.  Patient has worsening agitation after he went home, was uncooperative and seemed altered from his baseline.  Patient was sent back in the ED. He was febrile with lactic acidosis, CTA and PE showed right upper lobe nodules and mesenteric mass as well as left lower lobe pneumonia.  Patient was admitted for sepsis secondary to pneumonia.  Assessment & Plan:   Principal Problem:   Sepsis (HCC) Active Problems:   Chronic atrial fibrillation (HCC)   Essential hypertension   Parkinsonian features   Mixed Alzheimer's and vascular dementia (HCC)   History of bipolar disorder   Intermittent complete heart block (HCC)   CAD (coronary artery disease) of artery bypass graft   S/P placement of cardiac pacemaker   RUL Lung nodules and cecal/mesenteric mass   Acute metabolic encephalopathy  Sepsis secondary to pneumonia: Patient presented with fever 102F, tachycardia, tachypnea, change in mentation, lactic acidosis.   He is found to have left lower lobe pneumonia.  He remains on room air. Continue Rocephin and azithromycin I-S and flutter as tolerated. Follow-up blood cultures. Sepsis physiology improving.     Acute metabolic encephalopathy: At baseline the patient has advanced dementia, but is cooperative with care.   He presented with decreased responsiveness, agitation.  Likely precipitated by  sepsis. Standard delirium precautions.   RUL Lung nodules and Cecal / Mesenteric mass: He is being followed by Dr. Annella. Lately there was some plans to do imaging, but wife asked to hold off on this, and pursue primarily comfort approach. Consulted Palliative care, awaiting recommendation.   S/P placement of cardiac pacemaker: Stable. Outpatient follow up.     CAD (coronary artery disease) of artery bypass graft: Continue Lipitor, lisinopril .   Mixed Alzheimer's and vascular dementia (HCC) Bipolar disorder: He presented with agitation Likely due to sepsis. Continue home Depakote , Aricept , Lamictal , memantine , mirtazapine , and Geodon .   Parkinsonian features: Used to follow with Dr. Lane from [facility] neurology.  Appears to be still on Sinemet . Continue home Sinemet    Essential hypertension: Blood pressure elevated. Continue lisinopril  Hold torsemide    Chronic atrial fibrillation [facility]): Last electrophysiology note, and dispense report show that he is not on anticoagulation.   ECG shows rate controlled A-fib    DVT prophylaxis: Lovenox  Code Status: DNR Family Communication: No family at bedside. Disposition Plan:    Status is: Inpatient Remains inpatient appropriate because: Severity of illness.  Palliative care and conversations with the family about goals of care discussion.  Family wants to treat the treatable at this point.   Consultants:  Palliative care  Procedures: CT A/P  Antimicrobials:  Anti-infectives (From admission, onward)    Start     Dose/Rate Route Frequency Ordered Stop   12/26/23 2200  azithromycin (ZITHROMAX) 500 mg in sodium chloride  0.9 % 250 mL IVPB        500 mg 250 mL/hr over 60 Minutes  Intravenous Every 24 hours 12/26/23 0249 12/31/23 2159   12/26/23 1000  cefTRIAXone (ROCEPHIN) 2 g in sodium chloride  0.9 % 100 mL IVPB        2 g 200 mL/hr over 30 Minutes Intravenous Every 24 hours 12/26/23 0249 12/31/23 0959   12/25/23 2145   cefTRIAXone (ROCEPHIN) 1 g in sodium chloride  0.9 % 100 mL IVPB        1 g 200 mL/hr over 30 Minutes Intravenous  Once 12/25/23 2138 12/25/23 2315   12/25/23 2145  azithromycin (ZITHROMAX) 500 mg in sodium chloride  0.9 % 250 mL IVPB        500 mg 250 mL/hr over 60 Minutes Intravenous  Once 12/25/23 2138 12/26/23 0001      Subjective: Patient was seen and examined at bedside. Overnight events noted. Patient seems confused, remains in soft restraints.  He has eaten breakfast with the nurse. He has intermittent agitation trying to get out of bed.   Objective: Vitals:   12/27/23 1622 12/27/23 2322 12/28/23 0530 12/28/23 0849  BP: 123/65 125/68 (!) 146/81 (!) 168/75  Pulse: (!) 56 70 76 62  Resp: 18 16 16 18   Temp: 98.7 F (37.1 C) 98.5 F (36.9 C) 98.3 F (36.8 C) 98.4 F (36.9 C)  TempSrc: Oral Oral Oral   SpO2:  95% 97% 97%  Weight:      Height:        Intake/Output Summary (Last 24 hours) at 12/28/2023 1343 Last data filed at 12/28/2023 0900 Gross per 24 hour  Intake 340 ml  Output 650 ml  Net -310 ml   Filed Weights   12/27/23 0429  Weight: 75.6 kg    Examination:  General exam: Appears calm and comfortable, severely deconditioned, not in any distress. Respiratory system: CTA Bilaterally. Respiratory effort normal.  RR 14 Cardiovascular system: S1 & S2 heard, RRR. No JVD, murmurs, rubs, gallops or clicks.  Gastrointestinal system: Abdomen is non distended, soft and non tender.  Normal bowel sounds heard. Central nervous system: Arousable, following partial commands. No focal neurological deficits. Extremities: No edema, no cyanosis, no clubbing. Skin: No rashes, lesions or ulcers Psychiatry: Mood & affect appropriate.     Data Reviewed: I have personally reviewed following labs and imaging studies  CBC: Recent Labs  Lab 12/24/23 1529 12/25/23 1703 12/25/23 1713 12/26/23 0410  WBC 5.6 5.1  --  5.0  NEUTROABS 3.8 4.3  --   --   HGB 9.5* 10.8* 11.9*  10.3*  HCT 31.1* 34.7* 35.0* 32.6*  MCV 99.4 98.3  --  96.2  PLT 113* 119*  --  106*   Basic Metabolic Panel: Recent Labs  Lab 12/24/23 1529 12/25/23 1703 12/25/23 1713 12/26/23 0410  NA 139 136 137 135  K 4.4 4.4 4.6 4.1  CL 101 100 102 100  CO2 30 25  --  25  GLUCOSE 91 86 88 97  BUN 16 16 19 14   CREATININE 1.20 1.01 1.10 1.03  CALCIUM  9.5 9.6  --  9.7   GFR: Estimated Creatinine Clearance: 60.9 mL/min (by C-G formula based on SCr of 1.03 mg/dL). Liver Function Tests: Recent Labs  Lab 12/24/23 1529 12/25/23 1703  AST 21 24  ALT 6 16  ALKPHOS 92 107  BILITOT 0.4 0.6  PROT 6.3* 7.3  ALBUMIN 3.1* 3.5   No results for input(s): LIPASE, AMYLASE in the last 168 hours. No results for input(s): AMMONIA in the last 168 hours. Coagulation Profile: No results for input(s): INR,  PROTIME in the last 168 hours. Cardiac Enzymes: Recent Labs  Lab 12/24/23 1529  CKTOTAL 64   BNP (last 3 results) No results for input(s): PROBNP in the last 8760 hours. HbA1C: No results for input(s): HGBA1C in the last 72 hours. CBG: Recent Labs  Lab 12/25/23 1739  GLUCAP 85   Lipid Profile: No results for input(s): CHOL, HDL, LDLCALC, TRIG, CHOLHDL, LDLDIRECT in the last 72 hours. Thyroid  Function Tests: No results for input(s): TSH, T4TOTAL, FREET4, T3FREE, THYROIDAB in the last 72 hours. Anemia Panel: No results for input(s): VITAMINB12, FOLATE, FERRITIN, TIBC, IRON, RETICCTPCT in the last 72 hours. Sepsis Labs: Recent Labs  Lab 12/25/23 1710 12/25/23 1907  LATICACIDVEN 2.5* 1.4    Recent Results (from the past 240 hours)  Resp panel by RT-PCR (RSV, Flu A&B, Covid) Anterior Nasal Swab     Status: None   Collection Time: 12/25/23  8:33 PM   Specimen: Anterior Nasal Swab  Result Value Ref Range Status   SARS Coronavirus 2 by RT PCR NEGATIVE NEGATIVE Final   Influenza A by PCR NEGATIVE NEGATIVE Final   Influenza B by PCR  NEGATIVE NEGATIVE Final    Comment: (NOTE) The Xpert Xpress SARS-CoV-2/FLU/RSV plus assay is intended as an aid in the diagnosis of influenza from Nasopharyngeal swab specimens and should not be used as a sole basis for treatment. Nasal washings and aspirates are unacceptable for Xpert Xpress SARS-CoV-2/FLU/RSV testing.  Fact Sheet for Patients: bloggercourse.com  Fact Sheet for Healthcare Providers: seriousbroker.it  This test is not yet approved or cleared by the [place]  FDA and has been authorized for detection and/or diagnosis of SARS-CoV-2 by FDA under an Emergency Use Authorization (EUA). This EUA will remain in effect (meaning this test can be used) for the duration of the COVID-19 declaration under Section 564(b)(1) of the Act, 21 U.S.C. section 360bbb-3(b)(1), unless the authorization is terminated or revoked.     Resp Syncytial Virus by PCR NEGATIVE NEGATIVE Final    Comment: (NOTE) Fact Sheet for Patients: bloggercourse.com  Fact Sheet for Healthcare Providers: seriousbroker.it  This test is not yet approved or cleared by the [place]  FDA and has been authorized for detection and/or diagnosis of SARS-CoV-2 by FDA under an Emergency Use Authorization (EUA). This EUA will remain in effect (meaning this test can be used) for the duration of the COVID-19 declaration under Section 564(b)(1) of the Act, 21 U.S.C. section 360bbb-3(b)(1), unless the authorization is terminated or revoked.  Performed at [facility], [address], [place], [place] [address]   Culture, blood (routine x 2)     Status: None (Preliminary result)   Collection Time: 12/25/23 10:19 PM   Specimen: BLOOD RIGHT ARM  Result Value Ref Range Status   Specimen Description BLOOD RIGHT ARM  Final   Special Requests   Final    BOTTLES DRAWN AEROBIC AND ANAEROBIC Blood Culture adequate  volume   Culture   Final    NO GROWTH 3 DAYS Performed at [facility], [address], [place], [place] [address]    Report Status PENDING  Incomplete  Culture, blood (routine x 2)     Status: None (Preliminary result)   Collection Time: 12/25/23 10:19 PM   Specimen: BLOOD LEFT ARM  Result Value Ref Range Status   Specimen Description BLOOD LEFT ARM  Final   Special Requests   Final    BOTTLES DRAWN AEROBIC AND ANAEROBIC Blood Culture adequate volume   Culture  Final    NO GROWTH 3 DAYS Performed at [facility], [address], [place], [place] [address]    Report Status PENDING  Incomplete  MRSA Next Gen by PCR, Nasal     Status: None   Collection Time: 12/27/23  6:31 AM   Specimen: Nasal Mucosa; Nasal Swab  Result Value Ref Range Status   MRSA by PCR Next Gen NOT DETECTED NOT DETECTED Final    Comment: (NOTE) The GeneXpert MRSA Assay (FDA approved for NASAL specimens only), is one component of a comprehensive MRSA colonization surveillance program. It is not intended to diagnose MRSA infection nor to guide or monitor treatment for MRSA infections. Test performance is not FDA approved in patients less than 53 years old. Performed at [facility], [address] [place], [place] [address]     Radiology Studies: No results found.  Scheduled Meds:  atorvastatin   40 mg Oral QPM   carbidopa -levodopa   1 tablet Oral BID   divalproex   250 mg Oral TID   donepezil   10 mg Oral QHS   enoxaparin  (LOVENOX ) injection  40 mg Subcutaneous Q24H   lamoTRIgine   150 mg Oral BID   lisinopril   20 mg Oral QPM   memantine   10 mg Oral BID   mirtazapine   15 mg Oral QHS   ziprasidone   40 mg Oral QPM   ziprasidone   60 mg Oral q AM   Continuous Infusions:  azithromycin 500 mg (12/27/23 2110)   cefTRIAXone (ROCEPHIN)  IV 2 g (12/28/23 0923)     LOS: 3 days    Time spent: 35 mins    Darcel Dawley, MD [facility]   If 7PM-7AM, please contact  night-coverage

## 2023-12-28 NOTE — Progress Notes (Signed)
 Daily Progress Note   Patient Name: [name] [name]       Date: 12/28/2023 DOB: 12-12-1945  Age: 78 y.o. MRN#: [id number] Attending Physician: Leotis Bogus, MD Primary Care Physician: System, Provider Not In Admit Date: 12/25/2023  Reason for Consultation/Follow-up: Establishing goals of care   Length of Stay: 3  Current Medications: Scheduled Meds:   atorvastatin   40 mg Oral QPM   carbidopa -levodopa   1 tablet Oral BID   divalproex   250 mg Oral TID   donepezil   10 mg Oral QHS   enoxaparin  (LOVENOX ) injection  40 mg Subcutaneous Q24H   lamoTRIgine   150 mg Oral BID   lisinopril   20 mg Oral QPM   memantine   10 mg Oral BID   mirtazapine   15 mg Oral QHS   ziprasidone   40 mg Oral QPM   ziprasidone   60 mg Oral q AM    Continuous Infusions:  azithromycin 500 mg (12/27/23 2110)   cefTRIAXone (ROCEPHIN)  IV 2 g (12/28/23 0923)    PRN Meds: acetaminophen  **OR** acetaminophen , ondansetron  **OR** ondansetron  (ZOFRAN ) IV  Physical Exam Constitutional:      General: He is sleeping. He is not in acute distress.    Appearance: He is ill-appearing.  Cardiovascular:     Rate and Rhythm: Normal rate.  Pulmonary:     Effort: Pulmonary effort is normal.  Skin:    General: Skin is warm and dry.             Vital Signs: BP (!) 168/75   Pulse 62   Temp 98.4 F (36.9 C)   Resp 18   Ht 5' 11 (1.803 m)   Wt 75.6 kg   SpO2 97%   BMI 23.25 kg/m  SpO2: SpO2: 97 % O2 Device: O2 Device: Room Air O2 Flow Rate:      Patient Active Problem List   Diagnosis Date Noted   Sepsis (HCC) 12/25/2023   RUL Lung nodules and cecal/mesenteric mass 12/25/2023   Acute metabolic encephalopathy 12/25/2023   Bradycardia 04/25/2023   S/P placement of cardiac pacemaker 04/24/2023   Concussion  01/09/2023   OSA (obstructive sleep apnea) 01/09/2023   CAD (coronary artery disease) of artery bypass graft 01/09/2023   Dementia with behavioral disturbance (HCC) 01/09/2023   Atrial fibrillation (HCC) 01/09/2023   Chronic anticoagulation 01/09/2023  HTN (hypertension) 01/09/2023   Bipolar disorder (HCC) 01/09/2023   Dementia with behavioral disturbance (HCC) 01/04/2023   Fall at home, initial encounter 12/18/2022   Anemia of chronic disease 12/18/2022   Generalized weakness 12/17/2022   History of loop recorder - STJ 04/15/2022   Syncope, cardiogenic 11/24/2021   Intermittent complete heart block (HCC) 11/24/2021   Obstructive sleep apnea 02/14/2021   Urinary urgency 07/11/2020   History of total knee arthroplasty 12/16/2018   Osteoarthritis of left knee 06/04/2017   B12 deficiency 03/19/2017   Loss of memory 03/18/2017   Diplopia 08/06/2016   History of atrial fibrillation 08/06/2016   History of bipolar disorder 08/06/2016   History of restless legs syndrome 08/06/2016   Weakness 02/10/2016   Essential tremor 07/17/2015   Mixed Alzheimer's and vascular dementia (HCC) 10/28/2013   Parkinsonian features 03/31/2013   History of stroke 03/23/2013   Hyperlipidemia 03/20/2013   Brain TIA 03/18/2013   Gait instability 03/17/2013   Chronic atrial fibrillation (HCC) 03/17/2013   Essential hypertension 03/17/2013    Palliative Care Assessment & Plan   Patient Profile: 78 y.o. male  with past medical history of CAD, stroke, HTN, bipolar disorder, atrial fibrillation, sick sinus syndrome s/p PPM, and dementia admitted on 12/25/2023 from SNF after a fall and with agitation.   He was febrile with lactic acidosis, CTA and PE showed right upper lobe nodules and mesenteric mass as well as left lower lobe pneumonia.  Patient was admitted for sepsis secondary to pneumonia.   Today's Discussion: Reviewed chart and received update from nursing. Patient has been more alert today but  restless. Continues to have poor oral intake. Goal remains to treat the treatable with discharge back to [facility]. Patient sleeping in NAD. No family at bedside.  Spoke to patient's wife [name] Norris by phone. The meeting with hospice at [facility] was cancelled due to the patient's hospitalization. [name] Norris tells me she would like to have hospice services from [facility] at discharge since she had a very good experience with them when her father passed. Placed TOC order and notified [facility] liaison.   Encouraged patient's wife to reach out to PMT with questions or concerns.   Recommendations/Plan: Continue DNR Continue treating the treatable  Discharge back to [facility] order placed for hospice with [facility] at discharge Encouraged family to continue discussions re: goals of care Continued PMT support    Code Status:    Code Status Orders  (From admission, onward)           Start     Ordered   12/25/23 2358  Do not attempt resuscitation (DNR) Pre-Arrest Interventions Desired  Continuous       Question Answer Comment  If pulseless and not breathing No CPR or chest compressions.   In Pre-Arrest Conditions (Patient Has Pulse and Is Breathing) May intubate, use advanced airway interventions and cardioversion/ACLS medications if appropriate or indicated. May transfer to ICU.   Consent: Discussion documented in EHR or advanced directives reviewed      12/25/23 2359         Extensive chart review has been completed prior to seeing the patient including labs, vital signs, imaging, progress/consult notes, orders, medications, and available advance directive documents.  Time spent: 35 minutes  Thank you for allowing the Palliative Medicine Team to assist in the care of this patient.   Stephane CHRISTELLA Palin, NP  Please contact Palliative Medicine Team phone at [phone] for questions and concerns.

## 2023-12-29 ENCOUNTER — Other Ambulatory Visit (HOSPITAL_COMMUNITY): Payer: Self-pay

## 2023-12-29 DIAGNOSIS — G9341 Metabolic encephalopathy: Secondary | ICD-10-CM | POA: Diagnosis not present

## 2023-12-29 DIAGNOSIS — R6520 Severe sepsis without septic shock: Secondary | ICD-10-CM | POA: Diagnosis not present

## 2023-12-29 DIAGNOSIS — A419 Sepsis, unspecified organism: Secondary | ICD-10-CM | POA: Diagnosis not present

## 2023-12-29 MED ORDER — CEFDINIR 300 MG PO CAPS
300.0000 mg | ORAL_CAPSULE | Freq: Two times a day (BID) | ORAL | 0 refills | Status: AC
  Filled 2023-12-29: qty 4, 2d supply, fill #0

## 2023-12-29 MED ORDER — ALPRAZOLAM 1 MG PO TABS: 1.0000 mg | ORAL_TABLET | Freq: Two times a day (BID) | ORAL | 0 refills | Status: AC | PRN

## 2023-12-29 NOTE — Care Management Important Message (Signed)
 Important Message  Patient Details  Name: [name] [name] MRN: [id number] Date of Birth: Sep 02, 1945   Important Message Given:  Yes - Medicare IM     Claretta Deed 12/29/2023, 1:06 PM

## 2023-12-29 NOTE — Progress Notes (Incomplete)
 Patient ID: [id number] VINJE, male   DOB: 08-07-1945, 78 y.o.   MRN: [id number]    Progress Note from the Palliative Medicine Team at [facility]   Patient Name: [name] [name]        Date: 12/29/2023 DOB: 07/17/1945  Age: 78 y.o. MRN#: [id number] Attending Physician: Leotis Bogus, MD Primary Care Physician: System, Provider Not In Admit Date: 12/25/2023   Reason for Consultation/Follow-up   Establishing Goals of Care   HPI/ Brief Hospital Review     Subjective  Extensive chart review has been completed prior to meeting with patient/family  including labs, vital signs, imaging, progress/consult notes, orders, medications and available advance directive documents.    This NP assessed patient at the bedside as a follow up to  yesterday's GOCs meeting.    Billing based on MDM: ***  {Problems Addressed:304933}  {Amount and/or Complexity of Ijuj:695065}  {Risks:304936}    Education offered today regarding  the importance of continued conversation with family and their  medical providers regarding overall plan of care and treatment options,  ensuring decisions are within the context of the patients values and GOCs.  Questions and concerns addressed   Discussed with primary team and nursing staff   Time:   minutes  Detailed review of medical records ( labs, imaging, vital signs), medically appropriate exam ( MS, skin, cardiac,  resp)   discussed with treatment team, counseling and education to patient, family, staff, documenting clinical information, medication management, coordination of care    [name] Plants NP  Palliative Medicine Team Team Phone # [phone] Pager [phone]

## 2023-12-29 NOTE — Discharge Summary (Addendum)
 Physician Discharge Summary  [name] [name] FMW:[id number] DOB: 1945-11-30 DOA: 12/25/2023  PCP: System, Provider Not In  Admit date: 12/25/2023  Discharge date: 12/29/2023  Admitted From: SNF  Disposition:  SNF  Recommendations for Outpatient Follow-up:  Follow up with PCP in 1-2 weeks. Please obtain BMP/CBC in one week. Patient is being discharged to Skilled nursing facility for rehab. Advised to take Omnicef 300 mg twice daily for 2 days  Home Health:None Equipment/Devices:None  Discharge Condition: Stable CODE STATUS:DNR Diet recommendation: Thin Liquids  Brief Summary/ Hospital Course: This 78 y.o. Male with PMH significant for dementia, lives at memory care center, Atrial Fibrillation, SSS s/p PPM, not on AC, Parkinson's disease, known cecal mass and lung nodules, hx CAD, TIA, HTN and Bipolar who presented with fall and agitation. Patient has advanced dementia and is confused, all the history obtained from patient's wife. Patient had unwitnessed fall, He was seen in the ED with an eyebrow laceration of the left eye, which was glued and sent home. Patient has worsening agitation after he went home, He was uncooperative and seemed altered from his baseline. Patient was sent back in the ED. He was febrile with lactic acidosis, CTA and PE showed right upper lobe nodules and mesenteric mass as well as left lower lobe pneumonia. Patient was admitted for sepsis secondary to pneumonia. He was continued on IV antibiotics. Patient has improved and back to his baseline mental status. Palliative care consulted recommended patient can be discharged back to [facility] with with hospice will follow. Patient is being discharged on Omnicef 300 mg twice daily for 2 more days.  Discharge Diagnoses:  Principal Problem:   Sepsis (HCC) Active Problems:   Chronic atrial fibrillation (HCC)   Essential hypertension   Parkinsonian features   Mixed Alzheimer's and vascular dementia (HCC)   History  of bipolar disorder   Intermittent complete heart block (HCC)   CAD (coronary artery disease) of artery bypass graft   S/P placement of cardiac pacemaker   RUL Lung nodules and cecal/mesenteric mass   Acute metabolic encephalopathy  Sepsis secondary to pneumonia: Patient presented with fever 102F, tachycardia, tachypnea, change in mentation, lactic acidosis.   He is found to have left lower lobe pneumonia.  He remains on room air. Continue Rocephin and azithromycin I-S and flutter as tolerated. Blood cultures negative for 4 days. Sepsis physiology improving. Patient being discharged on Omnicef 300 mL twice daily for 2 more days   Acute metabolic encephalopathy: At baseline the patient has advanced dementia, but is cooperative with care.   He presented with decreased responsiveness, agitation.  Likely precipitated by sepsis. Standard delirium precautions.  Appears back to his baseline.   RUL Lung nodules and Cecal / Mesenteric mass: He is being followed by Dr. Annella. Lately there was some plans to do imaging, but wife asked to hold off on this, and pursue primarily comfort approach. Consulted Palliative care, awaiting recommendation.   S/P placement of cardiac pacemaker: Stable. Outpatient follow up.   CAD (coronary artery disease) of artery bypass graft: Continue Lipitor, lisinopril .   Mixed Alzheimer's and vascular dementia (HCC) Bipolar disorder: He presented with agitation Likely due to sepsis. Continue home Depakote , Aricept , Lamictal , memantine , mirtazapine , and Geodon .   Parkinsonian features: Used to follow with Dr. Lane from [facility] neurology.  Appears to be still on Sinemet . Continue home Sinemet    Essential hypertension: Blood pressure elevated. Continue lisinopril  Hold torsemide    Chronic atrial fibrillation [facility]): Last electrophysiology note, and dispense report show that  he is not on anticoagulation.   ECG shows rate controlled A-fib  Discharge  Instructions  Discharge Instructions     Call MD for:  difficulty breathing, headache or visual disturbances   Complete by: As directed    Call MD for:  persistant nausea and vomiting   Complete by: As directed    Diet - low sodium heart healthy   Complete by: As directed    Diet general   Complete by: As directed    Discharge instructions   Complete by: As directed    Advised to follow-up with primary care physician in 1 week. Patient be discharged physical nursing facility for rehab.   Increase activity slowly   Complete by: As directed       Allergies as of 12/29/2023       Reactions   Latex Other (See Comments)   Unknown         Medication List     STOP taking these medications    azithromycin 500 MG injection Commonly known as: ZITHROMAX   cefTRIAXone 10 g injection Commonly known as: ROCEPHIN   LORazepam  1 MG tablet Commonly known as: ATIVAN    XARELTO  PO       TAKE these medications    acetaminophen  325 MG tablet Commonly known as: TYLENOL  Take 650 mg by mouth in the morning and at bedtime.   ALPRAZolam 1 MG tablet Commonly known as: XANAX Take 1 tablet (1 mg total) by mouth 2 (two) times daily as needed for up to 3 days for anxiety.   atorvastatin  40 MG tablet Commonly known as: LIPITOR Take 1 tablet (40 mg total) by mouth every evening. Please call our office to schedule an yearly appointment with Dr. Court for November 2024 before anymore refills. [phone]   carbidopa -levodopa  25-250 MG tablet Commonly known as: SINEMET  IR Take 1 tablet by mouth 2 (two) times daily. What changed: Another medication with the same name was removed. Continue taking this medication, and follow the directions you see here.   cefdinir 300 MG capsule Commonly known as: OMNICEF Take 1 capsule (300 mg total) by mouth 2 (two) times daily for 2 days.   divalproex  125 MG DR tablet Commonly known as: DEPAKOTE  Take 250 mg by mouth 3 (three) times daily. What  changed: Another medication with the same name was removed. Continue taking this medication, and follow the directions you see here.   donepezil  10 MG tablet Commonly known as: ARICEPT  Take 10 mg by mouth at bedtime.   folic acid  1 MG tablet Commonly known as: FOLVITE  Take 1 mg by mouth at bedtime.   lamoTRIgine  150 MG tablet Commonly known as: LAMICTAL  Take 150 mg by mouth 2 (two) times daily.   lisinopril  10 MG tablet Commonly known as: ZESTRIL  Take 1 tablet (10 mg total) by mouth every evening. Please call our office to schedule an yearly appointment with Dr. Court for November 2024 before anymore refills. [phone]. Thank you 1st attempt What changed:  how much to take when to take this additional instructions   memantine  10 MG tablet Commonly known as: NAMENDA  Take 10 mg by mouth 2 (two) times daily.   mirtazapine  15 MG tablet Commonly known as: REMERON  Take 15 mg by mouth at bedtime.   Repatha SureClick 140 MG/ML Soaj Generic drug: Evolocumab Inject 140 mg into the skin every 14 (fourteen) days.   torsemide  10 MG tablet Commonly known as: DEMADEX  Take 10 mg by mouth daily.   traZODone 150 MG  tablet Commonly known as: DESYREL Take 150 mg by mouth at bedtime.   ziprasidone  40 MG capsule Commonly known as: GEODON  Take 40 mg by mouth every evening. To be given in addition to 60mg  in the morning for a total daily dose of 100mg .   ziprasidone  60 MG capsule Commonly known as: GEODON  Take 60 mg by mouth in the morning. To be given in addition to 40mg  in the evening for a total daily dose of 100mg .        Contact information for follow-up providers     [facility] at [facility] Follow up in 1 week(s).   Specialty: Family Medicine Contact information: [address] [place]  [address] [phone] Additional information: [address]  [address]  [place], [place] [address]              Contact information for after-discharge care     Destination     [facility] .   Service: Skilled Nursing Contact information: [address] [place]  [address] [phone]                    Allergies  Allergen Reactions   Latex Other (See Comments)    Unknown     Consultations: None   Procedures/Studies: CT CHEST ABDOMEN PELVIS W CONTRAST Result Date: 12/25/2023 EXAM: CT CHEST, ABDOMEN AND PELVIS WITH CONTRAST 12/25/2023 08:56:29 PM TECHNIQUE: CT of the chest, abdomen and pelvis was performed with the administration of 75 mL of iohexol  (OMNIPAQUE ) 350 MG/ML injection. Multiplanar reformatted images are provided for review. Automated exposure control, iterative reconstruction, and/or weight based adjustment of the mA/kV was utilized to reduce the radiation dose to as low as reasonably achievable. COMPARISON: 01/09/2023 CLINICAL HISTORY: Sepsis. FINDINGS: LIMITATIONS: Motion degraded images. CHEST: MEDIASTINUM AND LYMPH NODES: Mild cardiomegaly. Postoperative clavicle pacemaker. Calcified subcarinal node, chronic. No mediastinal, hilar or axillary lymphadenopathy. LUNGS AND PLEURA: Small bilateral pleural effusions. Mild patchy left lower lobe opacity with additional faint ground glass nodular opacities in the right upper lobe, suggesting multifocal pneumonia. Mild compressive atelectasis in the right lower lobe. ABDOMEN AND PELVIS: LIVER: The liver is unremarkable. GALLBLADDER AND BILE DUCTS: Gallbladder is unremarkable. No biliary ductal dilatation. SPLEEN: Calcified splenic granulomata. PANCREAS: No acute abnormality. ADRENAL GLANDS: No acute abnormality. KIDNEYS, URETERS AND BLADDER: 3.7 cm simple right upper pole renal cyst (image 72), benign (Bosniak 1). No follow-up is recommended. No stones in the kidneys or ureters. No hydronephrosis. No perinephric or periureteral stranding. Urinary bladder is unremarkable. GI AND BOWEL: Stomach  demonstrates no acute abnormality. There is no bowel obstruction. 2.5 cm enhancing polypoid lesion in the distal ileum (image 88), chronic but better visualized on the current study, favoring ileal carcinoid. Mild cervical stool burden. REPRODUCTIVE ORGANS: The prostate is unremarkable. PERITONEUM AND RETROPERITONEUM: No ascites. No free air. 2.3 x 3.3 cm partially calcified soft tissue lesion in the right lower quadrant mesentery (image 83), grossly unchanged, favoring mesenteric carcinoid. VASCULATURE: Aorta is normal in caliber. Atherosclerotic calcifications of the abdominal aorta and branch vessels, although patent. ABDOMINAL AND PELVIS LYMPH NODES: No lymphadenopathy. BONES AND SOFT TISSUES: Right hip arthroplasty. Mild degenerative changes of the left hip. Moderate degenerative changes of the visualized thoracolumbar spine. Mild diastasis of the midline anterior abdominal wall. No acute osseous abnormality. No focal soft tissue abnormality. IMPRESSION: 1. Motion degraded images, limiting evaluation. 2. Multifocal pneumonia, left lower lobe predominant. Small bilateral pleural effusions. 3. 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 Electronically signed by: Pinkie Pebbles MD 12/25/2023 09:05 PM EDT RP Workstation: [id number]   DG Chest Port 1 View Result Date: 12/25/2023 CLINICAL DATA:  [id number] AMS (altered mental status) [id number] EXAM: PORTABLE CHEST - 1 VIEW COMPARISON:  12/24/2023 FINDINGS: Low lung volumes. No focal airspace consolidation, pleural effusion, or pneumothorax. Moderate cardiomegaly. Cardiac loop recorder device. Left chest pacemaker with a single lead terminating in the right ventricle. Tortuous aorta with aortic atherosclerosis. No acute fracture or destructive lesions. Multilevel thoracic osteophytosis. IMPRESSION: Low lung volumes.  Otherwise, no acute cardiopulmonary abnormality. Electronically Signed   By: Rogelia Myers M.D.   On: 12/25/2023  18:49   CT Head Wo Contrast Result Date: 12/25/2023 CLINICAL DATA:  Blunt trauma, fall yesterday. EXAM: CT HEAD WITHOUT CONTRAST TECHNIQUE: Contiguous axial images were obtained from the base of the skull through the vertex without intravenous contrast. RADIATION DOSE REDUCTION: This exam was performed according to the departmental dose-optimization program which includes automated exposure control, adjustment of the mA and/or kV according to patient size and/or use of iterative reconstruction technique. COMPARISON:  Head CT yesterday FINDINGS: Brain: No acute intracranial hemorrhage. No subdural or extra-axial collection. Stable degree of atrophy and chronic small vessel ischemia. Encephalomalacia in the right MCA distribution, unchanged from yesterday's exam. No evidence of new ischemia. No midline shift or mass lesion/mass effect. Vascular: No hyperdense vessel or unexpected calcification. Skull: No fracture or focal lesion. Sinuses/Orbits: No acute finding. Other: No large scalp hematoma. IMPRESSION: 1. No acute intracranial abnormality. No skull fracture. 2. Stable chronic change. Electronically Signed   By: Andrea Gasman M.D.   On: 12/25/2023 17:45   CT Head Wo Contrast Result Date: 12/24/2023 CLINICAL DATA:  Ataxia, head trauma EXAM: CT HEAD WITHOUT CONTRAST TECHNIQUE: Contiguous axial images were obtained from the base of the skull through the vertex without intravenous contrast. RADIATION DOSE REDUCTION: This exam was performed according to the departmental dose-optimization program which includes automated exposure control, adjustment of the mA and/or kV according to patient size and/or use of iterative reconstruction technique. COMPARISON:  01/09/2023 FINDINGS: Brain: No acute infarct or hemorrhage. Chronic small vessel ischemic changes are again seen within the right MCA distribution and bilateral periventricular white matter. Lateral ventricles and midline structures are otherwise  unremarkable. No acute extra-axial fluid collections. No mass effect. Vascular: No hyperdense vessel or unexpected calcification. Skull: Normal. Negative for fracture or focal lesion. Sinuses/Orbits: No acute finding. Other: None. IMPRESSION: 1. No acute intracranial process. 2. Stable chronic ischemic changes as above. Electronically Signed   By: Ozell Daring M.D.   On: 12/24/2023 16:14   CT Cervical Spine Wo Contrast Result Date: 12/24/2023 CLINICAL DATA:  Cervical trauma, ataxia EXAM: CT CERVICAL SPINE WITHOUT CONTRAST TECHNIQUE: Multidetector CT imaging of the cervical spine was performed without intravenous contrast. Multiplanar CT image reconstructions were also generated. RADIATION DOSE REDUCTION: This exam was performed according to the departmental dose-optimization program which includes automated exposure control, adjustment of the mA and/or kV according to patient size and/or use of iterative reconstruction technique. COMPARISON:  01/09/2023 FINDINGS: Alignment: Alignment is grossly anatomic. Skull base and vertebrae: No acute fracture. No primary bone lesion or focal pathologic process. Soft tissues and spinal canal: No prevertebral fluid or swelling. No visible canal hematoma. Disc levels: There is bony ankylosis between the right lateral mass of C1 and the C2 vertebral body. Bony fusion across the left C2-3 facets and right C3-4 facets. There is progressive hypertrophic change at  the C1-C2 interface. Stable diffuse cervical spondylosis and facet hypertrophy, with flowing anterior osteophytes throughout the entirety of the cervicothoracic spine. Upper chest: Airway is patent. Visualized portions of the lung apices are clear. Other: Reconstructed images demonstrate no additional findings. IMPRESSION: 1. No acute cervical spine fracture. 2. Progressive multilevel cervical degenerative changes as above. Electronically Signed   By: Ozell Daring M.D.   On: 12/24/2023 16:13   DG Pelvis 1-2  Views Result Date: 12/24/2023 CLINICAL DATA:  Un witnessed fall EXAM: PELVIS - 1-2 VIEW COMPARISON:  01/09/2023 FINDINGS: Single frontal view of the pelvis includes both hips. Stable right hip arthroplasty. No acute fracture, subluxation, or dislocation. Stable left hip osteoarthritis. Stable multilevel lower lumbar spondylosis and facet hypertrophy. IMPRESSION: 1. No acute displaced fracture. 2. Stable right hip arthroplasty. 3. Stable degenerative changes of the lumbar spine and left hip. Electronically Signed   By: Ozell Daring M.D.   On: 12/24/2023 15:46   DG Chest Portable 1 View Result Date: 12/24/2023 CLINICAL DATA:  Un witnessed fall EXAM: PORTABLE CHEST 1 VIEW COMPARISON:  11/09/2023 FINDINGS: Single frontal view of the chest demonstrates stable enlargement of the cardiac silhouette. Single lead pacer and loop recorder again noted. Persistent retrocardiac opacity consistent with left lower lobe consolidation, likely atelectasis. No other areas of consolidation, effusion, or pneumothorax. Mild vascular congestion. No acute displaced fractures. IMPRESSION: 1. Persistent retrocardiac opacification, favor atelectasis over airspace disease. 2. Stable enlarged cardiac silhouette. Electronically Signed   By: Ozell Daring M.D.   On: 12/24/2023 15:46   CUP PACEART REMOTE DEVICE CHECK Result Date: 12/09/2023 PPM scheduled remote reviewed. Normal device function.  Presenting rhythm: VS 1 monitored VT event @ 154bpm x 1 sec.  Irregular. Next remote transmission per protocol. AB, CVRS    Subjective: Patient was seen and examined at bedside.  Overnight events noted. Patient reports feeling better and patient being discharged to SNF for rehab.  Discharge Exam: Vitals:   12/29/23 0406 12/29/23 0723  BP: (!) 159/81 (!) 175/78  Pulse: 93 61  Resp: 20 18  Temp: (!) 97.4 F (36.3 C) 98.2 F (36.8 C)  SpO2: 99%    Vitals:   12/28/23 2006 12/29/23 0009 12/29/23 0406 12/29/23 0723  BP: (!) 183/78  (!) 150/64 (!) 159/81 (!) 175/78  Pulse: 62 74 93 61  Resp: 20  20 18   Temp: 98.6 F (37 C)  (!) 97.4 F (36.3 C) 98.2 F (36.8 C)  TempSrc:    Oral  SpO2: 94%  99%   Weight:      Height:        General: Pt is alert, awake, not in acute distress Cardiovascular: RRR, S1/S2 +, no rubs, no gallops Respiratory: CTA bilaterally, no wheezing, no rhonchi Abdominal: Soft, NT, ND, bowel sounds + Extremities: no edema, no cyanosis    The results of significant diagnostics from this hospitalization (including imaging, microbiology, ancillary and laboratory) are listed below for reference.     Microbiology: Recent Results (from the past 240 hours)  Resp panel by RT-PCR (RSV, Flu A&B, Covid) Anterior Nasal Swab     Status: None   Collection Time: 12/25/23  8:33 PM   Specimen: Anterior Nasal Swab  Result Value Ref Range Status   SARS Coronavirus 2 by RT PCR NEGATIVE NEGATIVE Final   Influenza A by PCR NEGATIVE NEGATIVE Final   Influenza B by PCR NEGATIVE NEGATIVE Final    Comment: (NOTE) The Xpert Xpress SARS-CoV-2/FLU/RSV plus assay is intended as an aid  in the diagnosis of influenza from Nasopharyngeal swab specimens and should not be used as a sole basis for treatment. Nasal washings and aspirates are unacceptable for Xpert Xpress SARS-CoV-2/FLU/RSV testing.  Fact Sheet for Patients: bloggercourse.com  Fact Sheet for Healthcare Providers: seriousbroker.it  This test is not yet approved or cleared by the [place]  FDA and has been authorized for detection and/or diagnosis of SARS-CoV-2 by FDA under an Emergency Use Authorization (EUA). This EUA will remain in effect (meaning this test can be used) for the duration of the COVID-19 declaration under Section 564(b)(1) of the Act, 21 U.S.C. section 360bbb-3(b)(1), unless the authorization is terminated or revoked.     Resp Syncytial Virus by PCR NEGATIVE NEGATIVE Final     Comment: (NOTE) Fact Sheet for Patients: bloggercourse.com  Fact Sheet for Healthcare Providers: seriousbroker.it  This test is not yet approved or cleared by the [place]  FDA and has been authorized for detection and/or diagnosis of SARS-CoV-2 by FDA under an Emergency Use Authorization (EUA). This EUA will remain in effect (meaning this test can be used) for the duration of the COVID-19 declaration under Section 564(b)(1) of the Act, 21 U.S.C. section 360bbb-3(b)(1), unless the authorization is terminated or revoked.  Performed at [facility], [address], [place], [place] [address]   Culture, blood (routine x 2)     Status: None (Preliminary result)   Collection Time: 12/25/23 10:19 PM   Specimen: BLOOD RIGHT ARM  Result Value Ref Range Status   Specimen Description BLOOD RIGHT ARM  Final   Special Requests   Final    BOTTLES DRAWN AEROBIC AND ANAEROBIC Blood Culture adequate volume   Culture   Final    NO GROWTH 4 DAYS Performed at [facility], [address] [place], [place] [address]    Report Status PENDING  Incomplete  Culture, blood (routine x 2)     Status: None (Preliminary result)   Collection Time: 12/25/23 10:19 PM   Specimen: BLOOD LEFT ARM  Result Value Ref Range Status   Specimen Description BLOOD LEFT ARM  Final   Special Requests   Final    BOTTLES DRAWN AEROBIC AND ANAEROBIC Blood Culture adequate volume   Culture   Final    NO GROWTH 4 DAYS Performed at [facility], [address] [place], [place] [address]    Report Status PENDING  Incomplete  MRSA Next Gen by PCR, Nasal     Status: None   Collection Time: 12/27/23  6:31 AM   Specimen: Nasal Mucosa; Nasal Swab  Result Value Ref Range Status   MRSA by PCR Next Gen NOT DETECTED NOT DETECTED Final    Comment: (NOTE) The GeneXpert MRSA Assay (FDA approved for NASAL specimens only), is one component of a comprehensive MRSA  colonization surveillance program. It is not intended to diagnose MRSA infection nor to guide or monitor treatment for MRSA infections. Test performance is not FDA approved in patients less than 35 years old. Performed at [facility], [address], [place], [place] [address]      Labs: BNP (last 3 results) No results for input(s): BNP in the last 8760 hours. Basic Metabolic Panel: Recent Labs  Lab 12/24/23 1529 12/25/23 1703 12/25/23 1713 12/26/23 0410  NA 139 136 137 135  K 4.4 4.4 4.6 4.1  CL 101 100 102 100  CO2 30 25  --  25  GLUCOSE 91 86 88 97  BUN 16  16 19 14   CREATININE 1.20 1.01 1.10 1.03  CALCIUM  9.5 9.6  --  9.7   Liver Function Tests: Recent Labs  Lab 12/24/23 1529 12/25/23 1703  AST 21 24  ALT 6 16  ALKPHOS 92 107  BILITOT 0.4 0.6  PROT 6.3* 7.3  ALBUMIN 3.1* 3.5   No results for input(s): LIPASE, AMYLASE in the last 168 hours. No results for input(s): AMMONIA in the last 168 hours. CBC: Recent Labs  Lab 12/24/23 1529 12/25/23 1703 12/25/23 1713 12/26/23 0410  WBC 5.6 5.1  --  5.0  NEUTROABS 3.8 4.3  --   --   HGB 9.5* 10.8* 11.9* 10.3*  HCT 31.1* 34.7* 35.0* 32.6*  MCV 99.4 98.3  --  96.2  PLT 113* 119*  --  106*   Cardiac Enzymes: Recent Labs  Lab 12/24/23 1529  CKTOTAL 64   BNP: Invalid input(s): POCBNP CBG: Recent Labs  Lab 12/25/23 1739  GLUCAP 85   D-Dimer No results for input(s): DDIMER in the last 72 hours. Hgb A1c No results for input(s): HGBA1C in the last 72 hours. Lipid Profile No results for input(s): CHOL, HDL, LDLCALC, TRIG, CHOLHDL, LDLDIRECT in the last 72 hours. Thyroid  function studies No results for input(s): TSH, T4TOTAL, T3FREE, THYROIDAB in the last 72 hours.  Invalid input(s): FREET3 Anemia work up No results for input(s): VITAMINB12, FOLATE, FERRITIN, TIBC, IRON, RETICCTPCT in the last 72 hours. Urinalysis    Component Value Date/Time    COLORURINE YELLOW 12/25/2023 2009   APPEARANCEUR CLEAR 12/25/2023 2009   LABSPEC 1.015 12/25/2023 2009   PHURINE 9.0 (H) 12/25/2023 2009   GLUCOSEU NEGATIVE 12/25/2023 2009   HGBUR NEGATIVE 12/25/2023 2009   BILIRUBINUR NEGATIVE 12/25/2023 2009   KETONESUR 5 (A) 12/25/2023 2009   PROTEINUR NEGATIVE 12/25/2023 2009   UROBILINOGEN 0.2 03/17/2013 2048   NITRITE NEGATIVE 12/25/2023 2009   LEUKOCYTESUR NEGATIVE 12/25/2023 2009   Sepsis Labs Recent Labs  Lab 12/24/23 1529 12/25/23 1703 12/26/23 0410  WBC 5.6 5.1 5.0   Microbiology Recent Results (from the past 240 hours)  Resp panel by RT-PCR (RSV, Flu A&B, Covid) Anterior Nasal Swab     Status: None   Collection Time: 12/25/23  8:33 PM   Specimen: Anterior Nasal Swab  Result Value Ref Range Status   SARS Coronavirus 2 by RT PCR NEGATIVE NEGATIVE Final   Influenza A by PCR NEGATIVE NEGATIVE Final   Influenza B by PCR NEGATIVE NEGATIVE Final    Comment: (NOTE) The Xpert Xpress SARS-CoV-2/FLU/RSV plus assay is intended as an aid in the diagnosis of influenza from Nasopharyngeal swab specimens and should not be used as a sole basis for treatment. Nasal washings and aspirates are unacceptable for Xpert Xpress SARS-CoV-2/FLU/RSV testing.  Fact Sheet for Patients: bloggercourse.com  Fact Sheet for Healthcare Providers: seriousbroker.it  This test is not yet approved or cleared by the [place]  FDA and has been authorized for detection and/or diagnosis of SARS-CoV-2 by FDA under an Emergency Use Authorization (EUA). This EUA will remain in effect (meaning this test can be used) for the duration of the COVID-19 declaration under Section 564(b)(1) of the Act, 21 U.S.C. section 360bbb-3(b)(1), unless the authorization is terminated or revoked.     Resp Syncytial Virus by PCR NEGATIVE NEGATIVE Final    Comment: (NOTE) Fact Sheet for  Patients: bloggercourse.com  Fact Sheet for Healthcare Providers: seriousbroker.it  This test is not yet approved or cleared by the [place]  FDA and has been authorized for  detection and/or diagnosis of SARS-CoV-2 by FDA under an Emergency Use Authorization (EUA). This EUA will remain in effect (meaning this test can be used) for the duration of the COVID-19 declaration under Section 564(b)(1) of the Act, 21 U.S.C. section 360bbb-3(b)(1), unless the authorization is terminated or revoked.  Performed at [facility], [address], [place], [place] [address]   Culture, blood (routine x 2)     Status: None (Preliminary result)   Collection Time: 12/25/23 10:19 PM   Specimen: BLOOD RIGHT ARM  Result Value Ref Range Status   Specimen Description BLOOD RIGHT ARM  Final   Special Requests   Final    BOTTLES DRAWN AEROBIC AND ANAEROBIC Blood Culture adequate volume   Culture   Final    NO GROWTH 4 DAYS Performed at [facility], [address], [place], [place] [address]    Report Status PENDING  Incomplete  Culture, blood (routine x 2)     Status: None (Preliminary result)   Collection Time: 12/25/23 10:19 PM   Specimen: BLOOD LEFT ARM  Result Value Ref Range Status   Specimen Description BLOOD LEFT ARM  Final   Special Requests   Final    BOTTLES DRAWN AEROBIC AND ANAEROBIC Blood Culture adequate volume   Culture   Final    NO GROWTH 4 DAYS Performed at [facility], [address], [place], [place] [address]    Report Status PENDING  Incomplete  MRSA Next Gen by PCR, Nasal     Status: None   Collection Time: 12/27/23  6:31 AM   Specimen: Nasal Mucosa; Nasal Swab  Result Value Ref Range Status   MRSA by PCR Next Gen NOT DETECTED NOT DETECTED Final    Comment: (NOTE) The GeneXpert MRSA Assay (FDA approved for NASAL specimens only), is one component of a comprehensive MRSA colonization  surveillance program. It is not intended to diagnose MRSA infection nor to guide or monitor treatment for MRSA infections. Test performance is not FDA approved in patients less than 67 years old. Performed at [facility], [address] [place], [place] [address]      Time coordinating discharge: Over 30 minutes  SIGNED:   Darcel Dawley, MD  [facility] 12/29/2023, 3:44 PM Pager   If 7PM-7AM, please contact night-coverage

## 2023-12-29 NOTE — Progress Notes (Signed)
 PROGRESS NOTE    TAEDYN GLASSCOCK  FMW:[id number] DOB: 16-Jan-1946 DOA: 12/25/2023 PCP: System, Provider Not In   Brief Narrative:  This 78 y.o. Male with PMH significant for dementia, lives at memory care center, Atrial Fibrillation,  SSS s/p PPM,  not on AC, Parkinson's disease, known cecal mass and lung nodules, hx CAD, TIA, HTN and Bipolar who presented with fall and agitation.  Patient has advanced dementia and is confused, all the history obtained from patient's wife.  Patient had unwitnessed fall, He was seen in the ED with an eyebrow laceration of the left eye, which was glued and sent home.  Patient has worsening agitation after he went home, was uncooperative and seemed altered from his baseline.  Patient was sent back in the ED. He was febrile with lactic acidosis, CTA and PE showed right upper lobe nodules and mesenteric mass as well as left lower lobe pneumonia.  Patient was admitted for sepsis secondary to pneumonia.  Assessment & Plan:   Principal Problem:   Sepsis (HCC) Active Problems:   Chronic atrial fibrillation (HCC)   Essential hypertension   Parkinsonian features   Mixed Alzheimer's and vascular dementia (HCC)   History of bipolar disorder   Intermittent complete heart block (HCC)   CAD (coronary artery disease) of artery bypass graft   S/P placement of cardiac pacemaker   RUL Lung nodules and cecal/mesenteric mass   Acute metabolic encephalopathy  Sepsis secondary to pneumonia: Patient presented with fever 102F, tachycardia, tachypnea, change in mentation, lactic acidosis.   He is found to have left lower lobe pneumonia.  He remains on room air. Continue Rocephin and azithromycin I-S and flutter as tolerated. Blood cultures negative for 4 days. Sepsis physiology improving.     Acute metabolic encephalopathy: At baseline the patient has advanced dementia, but is cooperative with care.   He presented with decreased responsiveness, agitation.  Likely  precipitated by sepsis. Standard delirium precautions.   RUL Lung nodules and Cecal / Mesenteric mass: He is being followed by Dr. Annella. Lately there was some plans to do imaging, but wife asked to hold off on this, and pursue primarily comfort approach. Consulted Palliative care, awaiting recommendation.   S/P placement of cardiac pacemaker: Stable. Outpatient follow up.   CAD (coronary artery disease) of artery bypass graft: Continue Lipitor, lisinopril .   Mixed Alzheimer's and vascular dementia (HCC) Bipolar disorder: He presented with agitation Likely due to sepsis. Continue home Depakote , Aricept , Lamictal , memantine , mirtazapine , and Geodon .   Parkinsonian features: Used to follow with Dr. Lane from [facility] neurology.  Appears to be still on Sinemet . Continue home Sinemet    Essential hypertension: Blood pressure elevated. Continue lisinopril  Hold torsemide    Chronic atrial fibrillation [facility]): Last electrophysiology note, and dispense report show that he is not on anticoagulation.   ECG shows rate controlled A-fib    DVT prophylaxis: Lovenox  Code Status: DNR Family Communication: No family at bedside. Disposition Plan:    Status is: Inpatient Remains inpatient appropriate because: Severity of illness.  Palliative care had conversations with the family about goals of care discussion.  Family wants to treat the treatable at this point. Patient is being discharged to [facility], but family wants hospital bed to be delivered before discharge   Consultants:  Palliative care  Procedures: CT A/P  Antimicrobials:  Anti-infectives (From admission, onward)    Start     Dose/Rate Route Frequency Ordered Stop   12/26/23 2200  azithromycin (ZITHROMAX) 500 mg in sodium chloride  0.9 %  250 mL IVPB        500 mg 250 mL/hr over 60 Minutes Intravenous Every 24 hours 12/26/23 0249 12/31/23 2159   12/26/23 1000  cefTRIAXone (ROCEPHIN) 2 g in sodium chloride  0.9 %  100 mL IVPB        2 g 200 mL/hr over 30 Minutes Intravenous Every 24 hours 12/26/23 0249 12/31/23 0959   12/25/23 2145  cefTRIAXone (ROCEPHIN) 1 g in sodium chloride  0.9 % 100 mL IVPB        1 g 200 mL/hr over 30 Minutes Intravenous  Once 12/25/23 2138 12/25/23 2315   12/25/23 2145  azithromycin (ZITHROMAX) 500 mg in sodium chloride  0.9 % 250 mL IVPB        500 mg 250 mL/hr over 60 Minutes Intravenous  Once 12/25/23 2138 12/26/23 0001      Subjective: Patient was seen and examined at bedside. Overnight events noted. Patient seems confused, remains in soft restraints.  He has eaten breakfast with the nurse. He has intermittent agitation trying to get out of bed.   Objective: Vitals:   12/28/23 2006 12/29/23 0009 12/29/23 0406 12/29/23 0723  BP: (!) 183/78 (!) 150/64 (!) 159/81 (!) 175/78  Pulse: 62 74 93 61  Resp: 20  20 18   Temp: 98.6 F (37 C)  (!) 97.4 F (36.3 C) 98.2 F (36.8 C)  TempSrc:    Oral  SpO2: 94%  99%   Weight:      Height:        Intake/Output Summary (Last 24 hours) at 12/29/2023 1157 Last data filed at 12/28/2023 2007 Gross per 24 hour  Intake --  Output 150 ml  Net -150 ml   Filed Weights   12/27/23 0429  Weight: 75.6 kg    Examination:  General exam: Appears calm and comfortable, severely deconditioned, not in any distress. Respiratory system: CTA Bilaterally. Respiratory effort normal.  RR 13 Cardiovascular system: S1 & S2 heard, RRR. No JVD, murmurs, rubs, gallops or clicks.  Gastrointestinal system: Abdomen is non distended, soft and non tender.  Normal bowel sounds heard. Central nervous system: Arousable, following partial commands. No focal neurological deficits. Extremities: No edema, no cyanosis, no clubbing. Skin: No rashes, lesions or ulcers Psychiatry: Mood & affect appropriate.    Data Reviewed: I have personally reviewed following labs and imaging studies  CBC: Recent Labs  Lab 12/24/23 1529 12/25/23 1703 12/25/23 1713  12/26/23 0410  WBC 5.6 5.1  --  5.0  NEUTROABS 3.8 4.3  --   --   HGB 9.5* 10.8* 11.9* 10.3*  HCT 31.1* 34.7* 35.0* 32.6*  MCV 99.4 98.3  --  96.2  PLT 113* 119*  --  106*   Basic Metabolic Panel: Recent Labs  Lab 12/24/23 1529 12/25/23 1703 12/25/23 1713 12/26/23 0410  NA 139 136 137 135  K 4.4 4.4 4.6 4.1  CL 101 100 102 100  CO2 30 25  --  25  GLUCOSE 91 86 88 97  BUN 16 16 19 14   CREATININE 1.20 1.01 1.10 1.03  CALCIUM  9.5 9.6  --  9.7   GFR: Estimated Creatinine Clearance: 60.9 mL/min (by C-G formula based on SCr of 1.03 mg/dL). Liver Function Tests: Recent Labs  Lab 12/24/23 1529 12/25/23 1703  AST 21 24  ALT 6 16  ALKPHOS 92 107  BILITOT 0.4 0.6  PROT 6.3* 7.3  ALBUMIN 3.1* 3.5   No results for input(s): LIPASE, AMYLASE in the last 168 hours. No results for  input(s): AMMONIA in the last 168 hours. Coagulation Profile: No results for input(s): INR, PROTIME in the last 168 hours. Cardiac Enzymes: Recent Labs  Lab 12/24/23 1529  CKTOTAL 64   BNP (last 3 results) No results for input(s): PROBNP in the last 8760 hours. HbA1C: No results for input(s): HGBA1C in the last 72 hours. CBG: Recent Labs  Lab 12/25/23 1739  GLUCAP 85   Lipid Profile: No results for input(s): CHOL, HDL, LDLCALC, TRIG, CHOLHDL, LDLDIRECT in the last 72 hours. Thyroid  Function Tests: No results for input(s): TSH, T4TOTAL, FREET4, T3FREE, THYROIDAB in the last 72 hours. Anemia Panel: No results for input(s): VITAMINB12, FOLATE, FERRITIN, TIBC, IRON, RETICCTPCT in the last 72 hours. Sepsis Labs: Recent Labs  Lab 12/25/23 1710 12/25/23 1907  LATICACIDVEN 2.5* 1.4    Recent Results (from the past 240 hours)  Resp panel by RT-PCR (RSV, Flu A&B, Covid) Anterior Nasal Swab     Status: None   Collection Time: 12/25/23  8:33 PM   Specimen: Anterior Nasal Swab  Result Value Ref Range Status   SARS Coronavirus 2 by RT PCR  NEGATIVE NEGATIVE Final   Influenza A by PCR NEGATIVE NEGATIVE Final   Influenza B by PCR NEGATIVE NEGATIVE Final    Comment: (NOTE) The Xpert Xpress SARS-CoV-2/FLU/RSV plus assay is intended as an aid in the diagnosis of influenza from Nasopharyngeal swab specimens and should not be used as a sole basis for treatment. Nasal washings and aspirates are unacceptable for Xpert Xpress SARS-CoV-2/FLU/RSV testing.  Fact Sheet for Patients: bloggercourse.com  Fact Sheet for Healthcare Providers: seriousbroker.it  This test is not yet approved or cleared by the [place]  FDA and has been authorized for detection and/or diagnosis of SARS-CoV-2 by FDA under an Emergency Use Authorization (EUA). This EUA will remain in effect (meaning this test can be used) for the duration of the COVID-19 declaration under Section 564(b)(1) of the Act, 21 U.S.C. section 360bbb-3(b)(1), unless the authorization is terminated or revoked.     Resp Syncytial Virus by PCR NEGATIVE NEGATIVE Final    Comment: (NOTE) Fact Sheet for Patients: bloggercourse.com  Fact Sheet for Healthcare Providers: seriousbroker.it  This test is not yet approved or cleared by the [place]  FDA and has been authorized for detection and/or diagnosis of SARS-CoV-2 by FDA under an Emergency Use Authorization (EUA). This EUA will remain in effect (meaning this test can be used) for the duration of the COVID-19 declaration under Section 564(b)(1) of the Act, 21 U.S.C. section 360bbb-3(b)(1), unless the authorization is terminated or revoked.  Performed at [facility], [address] [place], [place] [address]   Culture, blood (routine x 2)     Status: None (Preliminary result)   Collection Time: 12/25/23 10:19 PM   Specimen: BLOOD RIGHT ARM  Result Value Ref Range Status   Specimen Description BLOOD RIGHT ARM  Final    Special Requests   Final    BOTTLES DRAWN AEROBIC AND ANAEROBIC Blood Culture adequate volume   Culture   Final    NO GROWTH 4 DAYS Performed at [facility], [address] [place], [place] [address]    Report Status PENDING  Incomplete  Culture, blood (routine x 2)     Status: None (Preliminary result)   Collection Time: 12/25/23 10:19 PM   Specimen: BLOOD LEFT ARM  Result Value Ref Range Status   Specimen Description BLOOD LEFT ARM  Final   Special Requests   Final  BOTTLES DRAWN AEROBIC AND ANAEROBIC Blood Culture adequate volume   Culture   Final    NO GROWTH 4 DAYS Performed at [facility], [address], [place], [place] [address]    Report Status PENDING  Incomplete  MRSA Next Gen by PCR, Nasal     Status: None   Collection Time: 12/27/23  6:31 AM   Specimen: Nasal Mucosa; Nasal Swab  Result Value Ref Range Status   MRSA by PCR Next Gen NOT DETECTED NOT DETECTED Final    Comment: (NOTE) The GeneXpert MRSA Assay (FDA approved for NASAL specimens only), is one component of a comprehensive MRSA colonization surveillance program. It is not intended to diagnose MRSA infection nor to guide or monitor treatment for MRSA infections. Test performance is not FDA approved in patients less than 12 years old. Performed at [facility], [address] [place], [place] [address]     Radiology Studies: No results found.  Scheduled Meds:  atorvastatin   40 mg Oral QPM   carbidopa -levodopa   1 tablet Oral BID   divalproex   250 mg Oral TID   donepezil   10 mg Oral QHS   enoxaparin  (LOVENOX ) injection  40 mg Subcutaneous Q24H   lamoTRIgine   150 mg Oral BID   lisinopril   20 mg Oral QPM   memantine   10 mg Oral BID   mirtazapine   15 mg Oral QHS   ziprasidone   40 mg Oral QPM   ziprasidone   60 mg Oral q AM   Continuous Infusions:  azithromycin 500 mg (12/28/23 2201)   cefTRIAXone (ROCEPHIN)  IV 2 g (12/29/23 0830)     LOS: 4 days    Time spent: 35  mins    Darcel Dawley, MD [facility]   If 7PM-7AM, please contact night-coverage

## 2023-12-29 NOTE — TOC Transition Note (Signed)
 Transition of Care [facility]) - Discharge Note   Patient Details  Name: [name] [name] MRN: [id number] Date of Birth: October 26, 1945  [facility]) CM/SW Contact:  Lendia Dais, LCSWA Phone Number: 12/29/2023, 2:28 PM   Clinical Narrative: Pt will discharge to [facility] place with outpatient hospice. PTAR called at 1325 and will arrive with an hour/hour and a half.  CSW attempted to call pt's spouse [name] to inform her of the pt's discharge. CSW was unable to leave a VM d/t one not being set up.  No further TOC needs.    Final next level of care: Skilled Nursing Facility     Patient Goals and CMS Choice            Discharge Placement              Patient chooses bed at: Other - please specify in the comment section below: [name]) Patient to be transferred to facility by: PTAR Name of family member notified: [name] (spouse) Patient and family notified of of transfer: 12/29/23  Discharge Plan and Services Additional resources added to the After Visit Summary for                                       Social Drivers of Health (SDOH) Interventions SDOH Screenings   Food Insecurity: Patient Unable To Answer (12/27/2023)  Housing: Patient Unable To Answer (12/27/2023)  Transportation Needs: Patient Unable To Answer (12/27/2023)  Utilities: Patient Unable To Answer (12/27/2023)  Social Connections: Patient Unable To Answer (12/27/2023)  Tobacco Use: Medium Risk (12/25/2023)     Readmission Risk Interventions     No data to display

## 2023-12-29 NOTE — Discharge Instructions (Addendum)
 Patient be discharged physical nursing facility for rehab. Advised to take Omnicef 300 mg twice daily for 2 days

## 2023-12-29 NOTE — Progress Notes (Addendum)
 [facility] Liaison Note  Received request from [facility] for hospice services at [facility] after discharge. Spoke with patient's wife to initiate education related to hospice philosophy, services and team approach to care. Wife verbalized understanding of information given. Per discussion, the plan is for discharge to facility today.   DME needs discussed. Patient has the following equipment in the home: admissions nurse to assess. Family requests the following equipment for delivery: none - facility to provide hospital bed.  Please send signed and completed DNR home with patient/family. Please provide prescriptions at discharge as needed to ensure ongoing symptom management.  [facility] information and contact numbers given to wife. Please call with any concerns.  Thank you for the opportunity to participate in this patient's care.   Eleanor Nail, LPN [facility] Liaison [phone]

## 2023-12-30 DIAGNOSIS — A419 Sepsis, unspecified organism: Secondary | ICD-10-CM | POA: Diagnosis not present

## 2023-12-30 DIAGNOSIS — J690 Pneumonitis due to inhalation of food and vomit: Secondary | ICD-10-CM | POA: Diagnosis not present

## 2023-12-30 DIAGNOSIS — F3130 Bipolar disorder, current episode depressed, mild or moderate severity, unspecified: Secondary | ICD-10-CM | POA: Diagnosis not present

## 2023-12-30 DIAGNOSIS — C180 Malignant neoplasm of cecum: Secondary | ICD-10-CM | POA: Diagnosis not present

## 2023-12-30 DIAGNOSIS — I69991 Dysphagia following unspecified cerebrovascular disease: Secondary | ICD-10-CM | POA: Diagnosis not present

## 2023-12-30 DIAGNOSIS — C481 Malignant neoplasm of specified parts of peritoneum: Secondary | ICD-10-CM | POA: Diagnosis not present

## 2023-12-30 DIAGNOSIS — G20A1 Parkinson's disease without dyskinesia, without mention of fluctuations: Secondary | ICD-10-CM | POA: Diagnosis not present

## 2023-12-30 DIAGNOSIS — F02818 Dementia in other diseases classified elsewhere, unspecified severity, with other behavioral disturbance: Secondary | ICD-10-CM | POA: Diagnosis not present

## 2023-12-30 DIAGNOSIS — R1310 Dysphagia, unspecified: Secondary | ICD-10-CM | POA: Diagnosis not present

## 2023-12-30 LAB — CULTURE, BLOOD (ROUTINE X 2)
Culture: NO GROWTH
Culture: NO GROWTH
Special Requests: ADEQUATE
Special Requests: ADEQUATE

## 2024-01-04 DIAGNOSIS — C180 Malignant neoplasm of cecum: Secondary | ICD-10-CM | POA: Diagnosis not present

## 2024-01-04 DIAGNOSIS — R296 Repeated falls: Secondary | ICD-10-CM | POA: Diagnosis not present

## 2024-01-04 DIAGNOSIS — G20A1 Parkinson's disease without dyskinesia, without mention of fluctuations: Secondary | ICD-10-CM | POA: Diagnosis not present

## 2024-01-04 DIAGNOSIS — F3130 Bipolar disorder, current episode depressed, mild or moderate severity, unspecified: Secondary | ICD-10-CM | POA: Diagnosis not present

## 2024-01-04 DIAGNOSIS — C481 Malignant neoplasm of specified parts of peritoneum: Secondary | ICD-10-CM | POA: Diagnosis not present

## 2024-01-04 DIAGNOSIS — J690 Pneumonitis due to inhalation of food and vomit: Secondary | ICD-10-CM | POA: Diagnosis not present

## 2024-01-04 DIAGNOSIS — F02818 Dementia in other diseases classified elsewhere, unspecified severity, with other behavioral disturbance: Secondary | ICD-10-CM | POA: Diagnosis not present

## 2024-01-04 DIAGNOSIS — I69991 Dysphagia following unspecified cerebrovascular disease: Secondary | ICD-10-CM | POA: Diagnosis not present

## 2024-01-04 DIAGNOSIS — R918 Other nonspecific abnormal finding of lung field: Secondary | ICD-10-CM | POA: Diagnosis not present

## 2024-01-05 DIAGNOSIS — G9341 Metabolic encephalopathy: Secondary | ICD-10-CM | POA: Diagnosis not present

## 2024-01-05 DIAGNOSIS — G20A1 Parkinson's disease without dyskinesia, without mention of fluctuations: Secondary | ICD-10-CM | POA: Diagnosis not present

## 2024-01-05 DIAGNOSIS — J189 Pneumonia, unspecified organism: Secondary | ICD-10-CM | POA: Diagnosis not present

## 2024-01-05 DIAGNOSIS — A419 Sepsis, unspecified organism: Secondary | ICD-10-CM | POA: Diagnosis not present

## 2024-01-06 DIAGNOSIS — F02818 Dementia in other diseases classified elsewhere, unspecified severity, with other behavioral disturbance: Secondary | ICD-10-CM | POA: Diagnosis not present

## 2024-01-06 DIAGNOSIS — R918 Other nonspecific abnormal finding of lung field: Secondary | ICD-10-CM | POA: Diagnosis not present

## 2024-01-06 DIAGNOSIS — F3130 Bipolar disorder, current episode depressed, mild or moderate severity, unspecified: Secondary | ICD-10-CM | POA: Diagnosis not present

## 2024-01-06 DIAGNOSIS — C180 Malignant neoplasm of cecum: Secondary | ICD-10-CM | POA: Diagnosis not present

## 2024-01-06 DIAGNOSIS — R296 Repeated falls: Secondary | ICD-10-CM | POA: Diagnosis not present

## 2024-01-06 DIAGNOSIS — G20A1 Parkinson's disease without dyskinesia, without mention of fluctuations: Secondary | ICD-10-CM | POA: Diagnosis not present

## 2024-01-06 DIAGNOSIS — J690 Pneumonitis due to inhalation of food and vomit: Secondary | ICD-10-CM | POA: Diagnosis not present

## 2024-01-06 DIAGNOSIS — C481 Malignant neoplasm of specified parts of peritoneum: Secondary | ICD-10-CM | POA: Diagnosis not present

## 2024-01-06 DIAGNOSIS — I69991 Dysphagia following unspecified cerebrovascular disease: Secondary | ICD-10-CM | POA: Diagnosis not present

## 2024-01-12 DIAGNOSIS — G20A1 Parkinson's disease without dyskinesia, without mention of fluctuations: Secondary | ICD-10-CM | POA: Diagnosis not present

## 2024-01-12 DIAGNOSIS — G9341 Metabolic encephalopathy: Secondary | ICD-10-CM | POA: Diagnosis not present

## 2024-01-12 DIAGNOSIS — J189 Pneumonia, unspecified organism: Secondary | ICD-10-CM | POA: Diagnosis not present

## 2024-01-12 DIAGNOSIS — A419 Sepsis, unspecified organism: Secondary | ICD-10-CM | POA: Diagnosis not present

## 2024-01-18 DIAGNOSIS — F319 Bipolar disorder, unspecified: Secondary | ICD-10-CM | POA: Diagnosis not present

## 2024-01-18 DIAGNOSIS — G20A1 Parkinson's disease without dyskinesia, without mention of fluctuations: Secondary | ICD-10-CM | POA: Diagnosis not present

## 2024-01-18 DIAGNOSIS — F02818 Dementia in other diseases classified elsewhere, unspecified severity, with other behavioral disturbance: Secondary | ICD-10-CM | POA: Diagnosis not present

## 2024-01-19 DIAGNOSIS — C481 Malignant neoplasm of specified parts of peritoneum: Secondary | ICD-10-CM | POA: Diagnosis not present

## 2024-01-19 DIAGNOSIS — F3130 Bipolar disorder, current episode depressed, mild or moderate severity, unspecified: Secondary | ICD-10-CM | POA: Diagnosis not present

## 2024-01-19 DIAGNOSIS — G20A1 Parkinson's disease without dyskinesia, without mention of fluctuations: Secondary | ICD-10-CM | POA: Diagnosis not present

## 2024-01-19 DIAGNOSIS — F02818 Dementia in other diseases classified elsewhere, unspecified severity, with other behavioral disturbance: Secondary | ICD-10-CM | POA: Diagnosis not present

## 2024-01-25 ENCOUNTER — Ambulatory Visit: Payer: Medicare PPO

## 2024-02-02 DIAGNOSIS — G20A1 Parkinson's disease without dyskinesia, without mention of fluctuations: Secondary | ICD-10-CM | POA: Diagnosis not present

## 2024-02-02 DIAGNOSIS — F02818 Dementia in other diseases classified elsewhere, unspecified severity, with other behavioral disturbance: Secondary | ICD-10-CM | POA: Diagnosis not present

## 2024-02-02 DIAGNOSIS — C481 Malignant neoplasm of specified parts of peritoneum: Secondary | ICD-10-CM | POA: Diagnosis not present

## 2024-02-04 ENCOUNTER — Emergency Department (HOSPITAL_COMMUNITY)

## 2024-02-04 ENCOUNTER — Encounter (HOSPITAL_COMMUNITY): Payer: Self-pay

## 2024-02-04 ENCOUNTER — Emergency Department (HOSPITAL_COMMUNITY)
Admission: EM | Admit: 2024-02-04 | Discharge: 2024-02-05 | Disposition: A | Discharge status: Skilled Nursing Facility | Source: Skilled Nursing Facility

## 2024-02-04 ENCOUNTER — Other Ambulatory Visit: Payer: Self-pay

## 2024-02-04 DIAGNOSIS — R918 Other nonspecific abnormal finding of lung field: Secondary | ICD-10-CM | POA: Diagnosis not present

## 2024-02-04 DIAGNOSIS — I6782 Cerebral ischemia: Secondary | ICD-10-CM | POA: Diagnosis not present

## 2024-02-04 DIAGNOSIS — Z515 Encounter for palliative care: Secondary | ICD-10-CM

## 2024-02-04 DIAGNOSIS — W19XXXA Unspecified fall, initial encounter: Secondary | ICD-10-CM

## 2024-02-04 DIAGNOSIS — I10 Essential (primary) hypertension: Secondary | ICD-10-CM | POA: Diagnosis not present

## 2024-02-04 DIAGNOSIS — Z043 Encounter for examination and observation following other accident: Secondary | ICD-10-CM | POA: Diagnosis not present

## 2024-02-04 DIAGNOSIS — M47812 Spondylosis without myelopathy or radiculopathy, cervical region: Secondary | ICD-10-CM | POA: Diagnosis not present

## 2024-02-04 LAB — CBC WITH DIFFERENTIAL/PLATELET
Abs Immature Granulocytes: 0.01 K/uL (ref 0.00–0.07)
Basophils Absolute: 0 K/uL (ref 0.0–0.1)
Basophils Relative: 1 %
Eosinophils Absolute: 0.3 K/uL (ref 0.0–0.5)
Eosinophils Relative: 6 %
HCT: 36.3 % — ABNORMAL LOW (ref 39.0–52.0)
Hemoglobin: 11.3 g/dL — ABNORMAL LOW (ref 13.0–17.0)
Immature Granulocytes: 0 %
Lymphocytes Relative: 27 %
Lymphs Abs: 1.2 K/uL (ref 0.7–4.0)
MCH: 30.5 pg (ref 26.0–34.0)
MCHC: 31.1 g/dL (ref 30.0–36.0)
MCV: 98.1 fL (ref 80.0–100.0)
Monocytes Absolute: 0.4 K/uL (ref 0.1–1.0)
Monocytes Relative: 8 %
Neutro Abs: 2.7 K/uL (ref 1.7–7.7)
Neutrophils Relative %: 58 %
Platelets: 140 K/uL — ABNORMAL LOW (ref 150–400)
RBC: 3.7 MIL/uL — ABNORMAL LOW (ref 4.22–5.81)
RDW: 14.2 % (ref 11.5–15.5)
WBC: 4.6 K/uL (ref 4.0–10.5)
nRBC: 0 % (ref 0.0–0.2)

## 2024-02-04 LAB — COMPREHENSIVE METABOLIC PANEL WITH GFR
ALT: 6 U/L (ref 0–44)
AST: 14 U/L — ABNORMAL LOW (ref 15–41)
Albumin: 3.3 g/dL — ABNORMAL LOW (ref 3.5–5.0)
Alkaline Phosphatase: 104 U/L (ref 38–126)
Anion gap: 7 (ref 5–15)
BUN: 34 mg/dL — ABNORMAL HIGH (ref 8–23)
CO2: 34 mmol/L — ABNORMAL HIGH (ref 22–32)
Calcium: 10.6 mg/dL — ABNORMAL HIGH (ref 8.9–10.3)
Chloride: 105 mmol/L (ref 98–111)
Creatinine, Ser: 1.35 mg/dL — ABNORMAL HIGH (ref 0.61–1.24)
GFR, Estimated: 53 mL/min — ABNORMAL LOW (ref >60)
Glucose, Bld: 99 mg/dL (ref 70–99)
Potassium: 3.9 mmol/L (ref 3.5–5.1)
Sodium: 146 mmol/L — ABNORMAL HIGH (ref 135–145)
Total Bilirubin: 0.4 mg/dL (ref 0.0–1.2)
Total Protein: 7 g/dL (ref 6.5–8.1)

## 2024-02-04 LAB — MAGNESIUM: Magnesium: 2.3 mg/dL (ref 1.7–2.4)

## 2024-02-04 MED ORDER — LACTATED RINGERS IV BOLUS: 1000.0000 mL | Freq: Once | INTRAVENOUS | Status: DC

## 2024-02-04 NOTE — ED Notes (Signed)
 Patient transported to CT

## 2024-02-04 NOTE — ED Provider Notes (Signed)
 [place] EMERGENCY DEPARTMENT AT [facility] Provider Note   CSN: [id number] Arrival date & time: 02/04/24  8057     Patient presents with: [name] [name] is a 78 y.o. male. Hx of dementia, lives at memory care center, Atrial Fibrillation, SSS s/p PPM, not on AC, Parkinson's disease, known cecal mass and lung nodules, hx CAD, TIA, HTN and Bipolar presenting s/p fall at facility today.  Patient is currently in hospice care.  Per report, patient fell approximately 530 today, uncertain how he fell, he is bedbound, and is generally only responsive to pain.  Recent admission for sepsis secondary to pneumonia, as well as appreciating multiple right upper lobe nodules with known colonic mass concerning for metastasis, which is why patient was placed on hospice care.  Recently discharged 12/29/2023.  Unknown how long patient was down for, unknown if patient has worsening mental status, reportedly from the facility patient is currently at his baseline mental status.  Patient previously on Xarelto , however recently stopped after starting on hospice care.  Patient unable to answer questions, will open eyes to pain, unable to follow commands.  Airway intact.   HPI     Prior to Admission medications   Medication Sig Start Date End Date Taking? Authorizing Provider  acetaminophen  (TYLENOL ) 325 MG tablet Take 650 mg by mouth in the morning and at bedtime.    [provider]  atorvastatin  (LIPITOR) 40 MG tablet Take 1 tablet (40 mg total) by mouth every evening. Please call our office to schedule an yearly appointment with Dr. Court for November 2024 before anymore refills. [phone] 12/25/22   Court Dorn PARAS, MD  carbidopa -levodopa  (SINEMET  IR) 25-250 MG tablet Take 1 tablet by mouth 2 (two) times daily. 11/25/22   [provider]  divalproex  (DEPAKOTE ) 125 MG DR tablet Take 250 mg by mouth 3 (three) times daily. 11/25/22   [provider]  donepezil   (ARICEPT ) 10 MG tablet Take 10 mg by mouth at bedtime. 11/25/22   [provider]  folic acid  (FOLVITE ) 1 MG tablet Take 1 mg by mouth at bedtime. 11/25/22   [provider]  lamoTRIgine  (LAMICTAL ) 150 MG tablet Take 150 mg by mouth 2 (two) times daily. 11/25/22   [provider]  lisinopril  (ZESTRIL ) 10 MG tablet Take 1 tablet (10 mg total) by mouth every evening. Please call our office to schedule an yearly appointment with Dr. Court for November 2024 before anymore refills. [phone]. Thank you 1st attempt Patient taking differently: Take 20 mg by mouth daily. 12/25/22   Court Dorn PARAS, MD  memantine  (NAMENDA ) 10 MG tablet Take 10 mg by mouth 2 (two) times daily. 11/25/22   [provider]  mirtazapine  (REMERON ) 15 MG tablet Take 15 mg by mouth at bedtime. 11/25/22   [provider]  REPATHA SURECLICK 140 MG/ML SOAJ Inject 140 mg into the skin every 14 (fourteen) days. 11/25/22   [provider]  torsemide  (DEMADEX ) 10 MG tablet Take 10 mg by mouth daily.    [provider]  traZODone  (DESYREL ) 150 MG tablet Take 150 mg by mouth at bedtime. 10/14/23   [provider]  ziprasidone  (GEODON ) 40 MG capsule Take 40 mg by mouth every evening. To be given in addition to 60mg  in the morning for a total daily dose of 100mg .    [provider]  ziprasidone  (GEODON ) 60 MG capsule Take 60 mg by mouth in the morning. To be given in addition  to 40mg  in the evening for a total daily dose of 100mg .    [provider]    Allergies: Latex    Review of Systems  Updated Vital Signs BP (!) 159/66   Pulse 60   Temp (!) 96.5 F (35.8 C) (Axillary)   Resp (!) 21   SpO2 100%   Physical Exam Vitals and nursing note reviewed.  Constitutional:      Appearance: He is ill-appearing.     Comments: GCS 8, opens eyes to pain, makes incomprehensible sounds, and withdraws to pain (does not localize). Chronically ill-appearing,  emaciated.  HENT:     Head: Normocephalic.     Comments: Small left-sided frontal hematoma appreciated.  No active bleeding.  No other appreciable head injuries.    Mouth/Throat:     Mouth: Mucous membranes are dry.  Eyes:     Extraocular Movements: Extraocular movements intact.     Conjunctiva/sclera: Conjunctivae normal.     Pupils: Pupils are equal, round, and reactive to light.     Comments: 2 mm, sluggishly reactive to light bilaterally.  Neck:     Comments: No palpable step-off to C/T/L-spine, patient unable to cooperate with evaluation of tenderness to palpation. Cardiovascular:     Rate and Rhythm: Normal rate and regular rhythm.     Pulses: Normal pulses.     Heart sounds: Normal heart sounds. No murmur heard.    No gallop.  Pulmonary:     Effort: Pulmonary effort is normal. No respiratory distress.     Breath sounds: Normal breath sounds. No stridor. No wheezing, rhonchi or rales.  Musculoskeletal:        General: No swelling, tenderness, deformity or signs of injury.     Cervical back: Neck supple. No rigidity.     Comments: No obvious deformity to bilateral upper and lower extremities.  Skin:    General: Skin is warm.     Capillary Refill: Capillary refill takes 2 to 3 seconds.  Neurological:     Comments: GCS 8, opens eyes to pain, makes incomprehensible sounds, and withdraws to pain (does not localize). Chronically ill-appearing, emaciated. Intermittently will move bilateral upper and lower extremities secondary to pain.     (all labs ordered are listed, but only abnormal results are displayed) Labs Reviewed  CBC WITH DIFFERENTIAL/PLATELET - Abnormal; Notable for the following components:      Result Value   RBC 3.70 (*)    Hemoglobin 11.3 (*)    HCT 36.3 (*)    Platelets 140 (*)    All other components within normal limits  COMPREHENSIVE METABOLIC PANEL WITH GFR - Abnormal; Notable for the following components:   Sodium 146 (*)    CO2 34 (*)    BUN 34 (*)     Creatinine, Ser 1.35 (*)    Calcium  10.6 (*)    Albumin 3.3 (*)    AST 14 (*)    GFR, Estimated 53 (*)    All other components within normal limits  MAGNESIUM    EKG: None  Radiology: CT Cervical Spine Wo Contrast Result Date: 02/05/2024 EXAM: CT CERVICAL SPINE WITHOUT CONTRAST 02/04/2024 11:12:00 PM TECHNIQUE: CT of the cervical spine was performed without the administration of intravenous contrast. Multiplanar reformatted images are provided for review. Automated exposure control, iterative reconstruction, and/or weight based adjustment of the mA/kV was utilized to reduce the radiation dose to as low as reasonably achievable. COMPARISON: 10 / 23 / 25 CLINICAL HISTORY: fall fall FINDINGS: CERVICAL  SPINE: BONES AND ALIGNMENT: No acute fracture or traumatic malalignment. DEGENERATIVE CHANGES: Fusion of anterior osteophytes throughout the cervical spine. Multilevel advanced facet arthropathy with ankylosis of the bilateral C2-C4 facets. Calcification of the posterior longitudinal ligament at C5-C6 causes mild effacement of the ventral thecal sac. No severe spinal canal narrowing. SOFT TISSUES: No prevertebral soft tissue swelling. IMPRESSION: 1. No acute abnormality of the cervical spine . Electronically signed by: Norman Gatlin MD 02/05/2024 12:05 AM EST RP Workstation: [id number]   CT Head Wo Contrast Result Date: 02/04/2024 EXAM: CT HEAD WITHOUT CONTRAST 02/04/2024 11:12:00 PM TECHNIQUE: CT of the head was performed without the administration of intravenous contrast. Automated exposure control, iterative reconstruction, and/or weight based adjustment of the mA/kV was utilized to reduce the radiation dose to as low as reasonably achievable. COMPARISON: Comparison with 12/25/2023. CLINICAL HISTORY: fall FINDINGS: BRAIN AND VENTRICLES: No acute hemorrhage. Chronic right MCA territory infarct. Chronic microvascular ischemia and generalized atrophy. No hydrocephalus. No extra-axial collection. No  mass effect or midline shift. ORBITS: No acute abnormality. SINUSES: No acute abnormality. SOFT TISSUES AND SKULL: No acute soft tissue abnormality. No skull fracture. IMPRESSION: 1. No acute intracranial abnormality related to the fall. Electronically signed by: Norman Gatlin MD 02/04/2024 11:23 PM EST RP Workstation: [id number]   DG Chest Portable 1 View Result Date: 02/04/2024 CLINICAL DATA:  Status post fall. EXAM: PORTABLE CHEST 1 VIEW COMPARISON:  December 25, 2023 FINDINGS: There is stable single lead ventricular pacer positioning. A loop recorder device is noted. The heart size and mediastinal contours are within normal limits. The lungs are hyperinflated. A small ill-defined nipple shadow versus focal scarring and/or atelectasis is seen overlying the left lung base. The visualized skeletal structures are unremarkable. IMPRESSION: Small ill-defined nipple shadow versus focal scarring and/or atelectasis overlying the left lung base. Correlation with follow-up chest plain film with nipple markers is recommended. Electronically Signed   By: Suzen Dials M.D.   On: 02/04/2024 20:43     Procedures   Medications Ordered in the ED - No data to display  Clinical Course as of 02/05/24 0019  Thu Feb 04, 2024  2226 Attempted to call the patient wife however was unable to reach her. [BS]  2331 Hospice care. GCS 8 at baseline. Here for fall. CT head negative. CT cervical spine pending.  [JR]  2344 I spoke with Randine Alter ([phone]) concerning the patient.  Provided updates concerning patient's care today, plan to transport back to facility.  Confirmed that the patient is currently at his baseline, all questions answered to patient family's satisfaction. [BS]    Clinical Course User Index [BS] Arlee Katz, MD [JR] Robinson, John K, PA-C                                 Medical Decision Making Amount and/or Complexity of Data Reviewed Labs: ordered. Radiology: ordered.   Based  on patient presentation, history, evaluation, low suspicion for intracranial bleed versus C-spine injury versus calvarial fracture status post fall today.  Patient is currently on hospice care, I spoke with the family who are agreeable with imaging however he did not want any further treatment provided to the patient as they primarily want the patient to undergo family comfort measures of care.  Imaging overall reassuring, lab workup with possible mild AKI, however per family request they would like patient discharged home to hospice facility.  Low suspicion for significant electrolyte derangement.  Overall,  patient is stable for discharge, plan to transfer back to hospice care facility for continued comfort care measures.  Has remained hemodynamically stable, pain well-controlled today.  Has remained at baseline mental status (GCS of 8).     Final diagnoses:  Fall, initial encounter  Hospice care patient    ED Discharge Orders     None          Arlee Katz, MD 02/05/24 0019    Neysa Caron PARAS, DO 02/13/24 [phone]

## 2024-02-04 NOTE — ED Notes (Signed)
 When you have time, Davante Gerke (daughter) [phone] would like an update on pt.

## 2024-02-04 NOTE — ED Notes (Signed)
 Pt's daughter [name] updated on plan of care. Pt calm at this time. Respirations regular and unlabored. Fall precautions in place.

## 2024-02-04 NOTE — ED Notes (Addendum)
 This RN has called CT to follow up on status on ordered CT. Still awaiting CT at this time. CT tech states they are running behind on CT orders. EDP made aware.

## 2024-02-04 NOTE — ED Triage Notes (Signed)
 Pt bib [facility] coming from [facility] after patient had mechanical fall around 1730 today. Per EMS, patient baseline is a&ox1. Pt responding to painful stimuli during triage. Pt does have hematoma to left side of forehead. Not on blood thinners.   EMS VS: 110/78 56 HR 14RR 99% RA 118 cbg

## 2024-02-05 NOTE — ED Notes (Signed)
 [facility] called and scheduled

## 2024-02-10 ENCOUNTER — Emergency Department (HOSPITAL_COMMUNITY)

## 2024-02-10 ENCOUNTER — Inpatient Hospital Stay (HOSPITAL_COMMUNITY)
Admission: EM | Admit: 2024-02-10 | Discharge: 2024-02-12 | DRG: 088 | Disposition: A | Discharge status: Skilled Nursing Facility | Source: Skilled Nursing Facility | Attending: Family Medicine | Admitting: Family Medicine

## 2024-02-10 DIAGNOSIS — R4182 Altered mental status, unspecified: Secondary | ICD-10-CM | POA: Diagnosis present

## 2024-02-10 DIAGNOSIS — J189 Pneumonia, unspecified organism: Secondary | ICD-10-CM

## 2024-02-10 DIAGNOSIS — E861 Hypovolemia: Secondary | ICD-10-CM

## 2024-02-10 LAB — CBC WITH DIFFERENTIAL/PLATELET
Abs Immature Granulocytes: 0.03 K/uL (ref 0.00–0.07)
Basophils Absolute: 0.1 K/uL (ref 0.0–0.1)
Basophils Relative: 1 %
Eosinophils Absolute: 0.2 K/uL (ref 0.0–0.5)
Eosinophils Relative: 3 %
HCT: 42.9 % (ref 39.0–52.0)
Hemoglobin: 13 g/dL (ref 13.0–17.0)
Immature Granulocytes: 0 %
Lymphocytes Relative: 15 %
Lymphs Abs: 1 K/uL (ref 0.7–4.0)
MCH: 30.3 pg (ref 26.0–34.0)
MCHC: 30.3 g/dL (ref 30.0–36.0)
MCV: 100 fL (ref 80.0–100.0)
Monocytes Absolute: 0.4 K/uL (ref 0.1–1.0)
Monocytes Relative: 6 %
Neutro Abs: 5.2 K/uL (ref 1.7–7.7)
Neutrophils Relative %: 75 %
Platelets: 172 K/uL (ref 150–400)
RBC: 4.29 MIL/uL (ref 4.22–5.81)
RDW: 14.1 % (ref 11.5–15.5)
WBC: 6.9 K/uL (ref 4.0–10.5)
nRBC: 0 % (ref 0.0–0.2)

## 2024-02-10 LAB — I-STAT CHEM 8, ED
BUN: 40 mg/dL — ABNORMAL HIGH (ref 8–23)
Calcium, Ion: 1.17 mmol/L (ref 1.15–1.40)
Chloride: 110 mmol/L (ref 98–111)
Creatinine, Ser: 1.4 mg/dL — ABNORMAL HIGH (ref 0.61–1.24)
Glucose, Bld: 110 mg/dL — ABNORMAL HIGH (ref 70–99)
HCT: 38 % — ABNORMAL LOW (ref 39.0–52.0)
Hemoglobin: 12.9 g/dL — ABNORMAL LOW (ref 13.0–17.0)
Potassium: 4.5 mmol/L (ref 3.5–5.1)
Sodium: 149 mmol/L — ABNORMAL HIGH (ref 135–145)
TCO2: 31 mmol/L (ref 22–32)

## 2024-02-10 LAB — OSMOLALITY: Osmolality: 336 mosm/kg (ref 275–295)

## 2024-02-10 LAB — COMPREHENSIVE METABOLIC PANEL WITH GFR
ALT: <5 U/L (ref 0–44)
AST: 20 U/L (ref 15–41)
Albumin: 3 g/dL — ABNORMAL LOW (ref 3.5–5.0)
Alkaline Phosphatase: 102 U/L (ref 38–126)
Anion gap: 7 (ref 5–15)
BUN: 30 mg/dL — ABNORMAL HIGH (ref 8–23)
CO2: 34 mmol/L — ABNORMAL HIGH (ref 22–32)
Calcium: 10.5 mg/dL — ABNORMAL HIGH (ref 8.9–10.3)
Chloride: 109 mmol/L (ref 98–111)
Creatinine, Ser: 1.48 mg/dL — ABNORMAL HIGH (ref 0.61–1.24)
GFR, Estimated: 48 mL/min — ABNORMAL LOW (ref >60)
Glucose, Bld: 122 mg/dL — ABNORMAL HIGH (ref 70–99)
Potassium: 4.3 mmol/L (ref 3.5–5.1)
Sodium: 150 mmol/L — ABNORMAL HIGH (ref 135–145)
Total Bilirubin: 0.6 mg/dL (ref 0.0–1.2)
Total Protein: 6.4 g/dL — ABNORMAL LOW (ref 6.5–8.1)

## 2024-02-10 LAB — RESPIRATORY PANEL BY PCR

## 2024-02-10 LAB — PROCALCITONIN: Procalcitonin: <0.1 ng/mL

## 2024-02-10 LAB — HIV ANTIBODY (ROUTINE TESTING W REFLEX): HIV Screen 4th Generation wRfx: NONREACTIVE

## 2024-02-10 LAB — CBG MONITORING, ED: Glucose-Capillary: 123 mg/dL — ABNORMAL HIGH (ref 70–99)

## 2024-02-10 LAB — I-STAT CG4 LACTIC ACID, ED: Lactic Acid, Venous: 0.9 mmol/L (ref 0.5–1.9)

## 2024-02-10 LAB — CK: Total CK: 321 U/L (ref 49–397)

## 2024-02-10 MED ORDER — HEPARIN SODIUM (PORCINE) 5000 UNIT/ML IJ SOLN
5000.0000 [IU] | Freq: Two times a day (BID) | INTRAMUSCULAR | Status: DC
  Administered 2024-02-10: 5000 [IU] via SUBCUTANEOUS
  Filled 2024-02-10 (×2): qty 1

## 2024-02-10 MED ORDER — SODIUM CHLORIDE 0.9 % IV SOLN
2.0000 g | INTRAVENOUS | Status: DC
  Administered 2024-02-10 – 2024-02-12 (×3): 2 g via INTRAVENOUS
  Filled 2024-02-10 (×3): qty 20

## 2024-02-10 MED ORDER — DEXTROSE 5 % IV SOLN: INTRAVENOUS | Status: DC

## 2024-02-10 MED ORDER — SODIUM CHLORIDE 0.9 % IV SOLN: 500.0000 mg | Freq: Once | INTRAVENOUS | Status: DC

## 2024-02-10 MED ORDER — SODIUM CHLORIDE 0.9 % IV SOLN: 2.0000 g | Freq: Once | INTRAVENOUS | Status: DC

## 2024-02-10 MED ORDER — SODIUM CHLORIDE 0.9 % IV SOLN
500.0000 mg | INTRAVENOUS | Status: DC
  Administered 2024-02-10 – 2024-02-12 (×3): 500 mg via INTRAVENOUS
  Filled 2024-02-10 (×3): qty 5

## 2024-02-10 MED ORDER — SODIUM CHLORIDE 0.45 % IV BOLUS
1000.0000 mL | Freq: Once | INTRAVENOUS | Status: AC
  Administered 2024-02-10: 1000 mL via INTRAVENOUS

## 2024-02-10 MED ORDER — SODIUM CHLORIDE 0.9 % IV BOLUS
1000.0000 mL | Freq: Once | INTRAVENOUS | Status: AC
  Administered 2024-02-10: 1000 mL via INTRAVENOUS

## 2024-02-10 NOTE — ED Notes (Addendum)
CMP hemolyzed per lab

## 2024-02-10 NOTE — ED Triage Notes (Signed)
 PT BIB EMS after a fall at [facility]. Pt stood up out of wheelchair and fell, hit head on wall, and has been less conscious since fall. GCS 8 on arrival, responsive to painful stimuli. Unclear if pt on thinners. 73/42 BP with EMS. Gold and MOST form say comfort measures.

## 2024-02-10 NOTE — Plan of Care (Signed)
  Problem: Activity: Goal: Ability to tolerate increased activity will improve Outcome: Progressing   Problem: Clinical Measurements: Goal: Ability to maintain a body temperature in the normal range will improve Outcome: Progressing   Problem: Respiratory: Goal: Ability to maintain adequate ventilation will improve Outcome: Progressing Goal: Ability to maintain a clear airway will improve Outcome: Progressing   

## 2024-02-10 NOTE — H&P (Addendum)
 History and Physical    Patient: [name] [name] FMW:[id number] DOB: June 04, 1945 DOA: 02/10/2024 DOS: the patient was seen and examined on 02/10/2024 . PCP: [name] Nottingham, MD  Patient coming from: SNF Chief complaint: Chief Complaint  Patient presents with   Fall   HPI:  [name] [name] is a 78 y.o. male with past medical history  of  Hx of dementia, lives at memory care center, Atrial Fibrillation, SSS s/p PPM, not on AC, Parkinson's disease, known cecal mass and lung nodules, hx CAD, TIA, HTN and Bipolar presenting s/p fall at facility today , abdominal binder status on presentation.  Per ED provider was discussed CODE STATUS and family reports patient is DNR/DNI but family request patient be treated medically as needed with IV antibiotics IV fluids. Admission requested for pneumonia and altered mental status decreased p.o. intake. Initial blood pressure 73/42 with an initial GCS of 8 on arrival.  Patient's daughter : [name] [name] : [phone].  ED Course:  Vital signs in the ED were notable for the following:  Vitals:   02/10/24 1146 02/10/24 1245 02/10/24 1250 02/10/24 1255  BP:  119/72 133/61 (!) 151/83  Pulse:  75 84 81  Temp: (!) 97.4 F (36.3 C)     Resp:  17 17 15   SpO2:  100% 100% 100%  TempSrc: Temporal      >>ED evaluation thus far shows: - CMP shows sodium 150 bicarb 34 glucose 122 BUN of 38 creatinine of 1.48 calcium  10.5 anion gap of 7 LFTs within normal limits. -CBC shows normal white count of 6.9 hemoglobin of 13.8 suspect is erroneous due to hemoconcentration, platelets 172. -CT imaging with on contrast head CT shows no acute intracranial abnormality please see complete report below, CT chest without contrast showed mild dependent lower lobe ground glass and reticular opacities with concerns for either edema or atelectasis or infection as patient is of a small calcified granuloma in the right upper lobe and a bulky calcified right mediastinal lymph node as a  sequelae from prior granulomatous disease.  CT cervical spine is negative for any acute abnormality of C-spine related to polytrauma.  Patient does show moderate to severe left neural foraminal stenosis at C4-C5 level. EKG shows A-fib at 99 bpm with LVH and a prolonged QT of 522.   >>While in the ED patient received the following: Medications  cefTRIAXone  (ROCEPHIN ) 2 g in sodium chloride  0.9 % 100 mL IVPB (has no administration in time range)  azithromycin  (ZITHROMAX ) 500 mg in sodium chloride  0.9 % 250 mL IVPB (has no administration in time range)  sodium chloride  0.9 % bolus 1,000 mL (0 mLs Intravenous Stopped 02/10/24 1257)   Review of Systems  Unable to perform ROS: Mental status change   Past Medical History:  Diagnosis Date   Alzheimer disease (HCC)    Anxiety    Arthritis    Atrial fibrillation (HCC)    Chronic   Bipolar disorder (HCC)    Chronic low back pain    Dementia (HCC)    Depression    Dizziness    Dysrhythmia    a fib   ED (erectile dysfunction)    External hemorrhoids    Fatigue    HLD (hyperlipidemia)    HTN (hypertension)    Hypogonadism in male    Lipoma of skin    Memory loss    Mixed Alzheimer's and vascular dementia (HCC)    Poor balance    Restless legs syndrome (RLS)  S/P placement of cardiac pacemaker 04/24/2023   Medtronic single lead PPM    Sleep apnea    cpap   Stroke [facility]) 1991   Tremor    Past Surgical History:  Procedure Laterality Date   COLONOSCOPY     KNEE ARTHROPLASTY Left 06/04/2017   Procedure: LEFT TOTAL KNEE ARTHROPLASTY WITH COMPUTER NAVIGATION;  Surgeon: [name] Rogue, MD;  Location: [facility];  Service: Orthopedics;  Laterality: Left;  NEEDS RNFA   LOOP RECORDER INSERTION N/A 11/25/2021   Procedure: LOOP RECORDER INSERTION;  Surgeon: [name] Elspeth BROCKS, MD;  Location: [facility] INVASIVE CV LAB;  Service: Cardiovascular;  Laterality: N/A;   PACEMAKER IMPLANT N/A 04/24/2023   Procedure: PACEMAKER IMPLANT;  Surgeon: [name] Elspeth BROCKS,  MD;  Location: [facility] INVASIVE CV LAB;  Service: Cardiovascular;  Laterality: N/A;   TONSILLECTOMY AND ADENOIDECTOMY     TOTAL HIP ARTHROPLASTY Right 07/31/2016   Procedure: RIGHT TOTAL HIP ARTHROPLASTY ANTERIOR APPROACH;  Surgeon: [name] Rogue, MD;  Location: [facility];  Service: Orthopedics;  Laterality: Right;  Requesting RNFA    reports that he has never smoked. He quit smokeless tobacco use about 55 years ago. He reports current alcohol  use of about 1.0 standard drink of alcohol  per week. He reports that he does not use drugs. Allergies  Allergen Reactions   Latex Other (See Comments)    Unknown    Family History  Problem Relation Age of Onset   Suicidality Unknown    Cancer Unknown    Alcohol  abuse Unknown    Cancer Mother    Suicidality Father    Prior to Admission medications   Medication Sig Start Date End Date Taking? Authorizing Provider  acetaminophen  (TYLENOL ) 325 MG tablet Take 650 mg by mouth in the morning and at bedtime.    [provider]  atorvastatin  (LIPITOR) 40 MG tablet Take 1 tablet (40 mg total) by mouth every evening. Please call our office to schedule an yearly appointment with Dr. Court for November 2024 before anymore refills. [phone] 12/25/22   [name] Dorn PARAS, MD  carbidopa -levodopa  (SINEMET  IR) 25-250 MG tablet Take 1 tablet by mouth 2 (two) times daily. 11/25/22   [provider]  divalproex  (DEPAKOTE ) 125 MG DR tablet Take 250 mg by mouth 3 (three) times daily. 11/25/22   [provider]  donepezil  (ARICEPT ) 10 MG tablet Take 10 mg by mouth at bedtime. 11/25/22   [provider]  folic acid  (FOLVITE ) 1 MG tablet Take 1 mg by mouth at bedtime. 11/25/22   [provider]  lamoTRIgine  (LAMICTAL ) 150 MG tablet Take 150 mg by mouth 2 (two) times daily. 11/25/22   [provider]  lisinopril  (ZESTRIL ) 10 MG tablet Take 1 tablet (10 mg total) by mouth every evening. Please call our office to schedule an yearly  appointment with Dr. Court for November 2024 before anymore refills. [phone]. Thank you 1st attempt Patient taking differently: Take 20 mg by mouth daily. 12/25/22   [name] Dorn PARAS, MD  memantine  (NAMENDA ) 10 MG tablet Take 10 mg by mouth 2 (two) times daily. 11/25/22   [provider]  mirtazapine  (REMERON ) 15 MG tablet Take 15 mg by mouth at bedtime. 11/25/22   [provider]  REPATHA SURECLICK 140 MG/ML SOAJ Inject 140 mg into the skin every 14 (fourteen) days. 11/25/22   [provider]  torsemide  (DEMADEX ) 10 MG tablet Take 10 mg by mouth daily.    [provider]  traZODone (DESYREL) 150 MG tablet Take  150 mg by mouth at bedtime. 10/14/23   [provider]  ziprasidone  (GEODON ) 40 MG capsule Take 40 mg by mouth every evening. To be given in addition to 60mg  in the morning for a total daily dose of 100mg .    [provider]  ziprasidone  (GEODON ) 60 MG capsule Take 60 mg by mouth in the morning. To be given in addition to 40mg  in the evening for a total daily dose of 100mg .    [provider]                                                                                 Vitals:   02/10/24 1146 02/10/24 1245 02/10/24 1250 02/10/24 1255  BP:  119/72 133/61 (!) 151/83  Pulse:  75 84 81  Resp:  17 17 15   Temp: (!) 97.4 F (36.3 C)     TempSrc: Temporal     SpO2:  100% 100% 100%   Physical Exam Constitutional:      General: He is not in acute distress.    Appearance: He is ill-appearing.  Eyes:     Pupils: Pupils are equal, round, and reactive to light.  Cardiovascular:     Rate and Rhythm: Normal rate and regular rhythm.     Pulses: Normal pulses.  Pulmonary:     Effort: Pulmonary effort is normal.     Breath sounds: Normal breath sounds.  Musculoskeletal:     Right lower leg: No edema.     Left lower leg: No edema.  Neurological:     GCS: GCS eye subscore is 3. GCS verbal subscore is 3. GCS motor subscore is 4.      Comments: Pt is mumbling and      Labs on Admission: I have personally reviewed following labs and imaging studies CBC: Recent Labs  Lab 02/04/24 2130 02/10/24 1136 02/10/24 1242  WBC 4.6 6.9  --   NEUTROABS 2.7 5.2  --   HGB 11.3* 13.0 12.9*  HCT 36.3* 42.9 38.0*  MCV 98.1 100.0  --   PLT 140* 172  --    Basic Metabolic Panel: Recent Labs  Lab 02/04/24 2130 02/10/24 1225 02/10/24 1242  NA 146* 150* 149*  K 3.9 4.3 4.5  CL 105 109 110  CO2 34* 34*  --   GLUCOSE 99 122* 110*  BUN 34* 30* 40*  CREATININE 1.35* 1.48* 1.40*  CALCIUM  10.6* 10.5*  --   MG 2.3  --   --    GFR: CrCl cannot be calculated (Unknown ideal weight.). Liver Function Tests: Recent Labs  Lab 02/04/24 2130 02/10/24 1225  AST 14* 20  ALT 6 <5  ALKPHOS 104 102  BILITOT 0.4 0.6  PROT 7.0 6.4*  ALBUMIN 3.3* 3.0*   No results for input(s): LIPASE, AMYLASE in the last 168 hours. No results for input(s): AMMONIA in the last 168 hours. Recent Labs    04/24/23 0901 04/25/23 0336 11/05/23 1500 12/24/23 1529 12/25/23 1703 12/25/23 1713 12/26/23 0410 02/04/24 2130 02/10/24 1225 02/10/24 1242  BUN 13 17 27* 16 16 19 14  34* 30* 40*  CREATININE 0.99 1.17 1.34* 1.20 1.01 1.10 1.03 1.35* 1.48* 1.40*  Cardiac Enzymes: No results for input(s): CKTOTAL, CKMB, CKMBINDEX, TROPONINI in the last 168 hours. BNP (last 3 results) No results for input(s): PROBNP in the last 8760 hours. HbA1C: No results for input(s): HGBA1C in the last 72 hours. CBG: Recent Labs  Lab 02/10/24 1216  GLUCAP 123*   Lipid Profile: No results for input(s): CHOL, HDL, LDLCALC, TRIG, CHOLHDL, LDLDIRECT in the last 72 hours. Thyroid  Function Tests: No results for input(s): TSH, T4TOTAL, FREET4, T3FREE, THYROIDAB in the last 72 hours. Anemia Panel: No results for input(s): VITAMINB12, FOLATE, FERRITIN, TIBC, IRON, RETICCTPCT in the last 72 hours. Urine analysis:     Component Value Date/Time   COLORURINE YELLOW 12/25/2023 2009   APPEARANCEUR CLEAR 12/25/2023 2009   LABSPEC 1.015 12/25/2023 2009   PHURINE 9.0 (H) 12/25/2023 2009   GLUCOSEU NEGATIVE 12/25/2023 2009   HGBUR NEGATIVE 12/25/2023 2009   BILIRUBINUR NEGATIVE 12/25/2023 2009   KETONESUR 5 (A) 12/25/2023 2009   PROTEINUR NEGATIVE 12/25/2023 2009   UROBILINOGEN 0.2 03/17/2013 2048   NITRITE NEGATIVE 12/25/2023 2009   LEUKOCYTESUR NEGATIVE 12/25/2023 2009   Radiological Exams on Admission: DG Chest Portable 1 View Result Date: 02/10/2024 EXAM: 1 VIEW(S) XRAY OF THE CHEST 02/10/2024 12:01:00 PM COMPARISON: Comparison 6 days ago. CLINICAL HISTORY: ams FINDINGS: LUNGS AND PLEURA: No focal pulmonary opacity. No pleural effusion. No pneumothorax. HEART AND MEDIASTINUM: Left-sided pacemaker is unchanged. Stable cardiomediastinal silhouette. BONES AND SOFT TISSUES: No acute osseous abnormality. IMPRESSION: 1. No acute cardiopulmonary process. Electronically signed by: Lynwood Seip MD 02/10/2024 12:28 PM EST RP Workstation: [id number]   CT Chest Wo Contrast Result Date: 02/10/2024 EXAM: CT CHEST WITHOUT CONTRAST 02/10/2024 11:53:00 AM TECHNIQUE: CT of the chest was performed without the administration of intravenous contrast. Multiplanar reformatted images are provided for review. Automated exposure control, iterative reconstruction, and/or weight based adjustment of the mA/kV was utilized to reduce the radiation dose to as low as reasonably achievable. COMPARISON: CT of the chest dated 12/25/2023. CLINICAL HISTORY: Sepsis workup. Eval for pneumonia. FINDINGS: MEDIASTINUM: Heart: Moderate calcific coronary artery disease. Single lead cardiac pacers present. Pericardium is unremarkable. The central airways are clear. Moderate calcific atheromatous disease within the thoracic aorta. LYMPH NODES: Bulky calcified right mediastinal lymph node. No hilar or axillary lymphadenopathy. LUNGS AND PLEURA: Mild  ground-glass and reticular opacities present dependently within the lower lobes, likely representing a combination of mild edema and atelectasis. Small calcified granuloma present laterally within the right upper lobe. No pleural effusion or pneumothorax. SOFT TISSUES/BONES: Mild degenerative changes throughout the thoracic spine. No acute abnormality of the bones or soft tissues. UPPER ABDOMEN: Limited images of the upper abdomen demonstrates no acute abnormality. IMPRESSION: 1. Mild dependent lower lobe ground-glass and reticular opacities, favor mild edema and atelectasis; correlate clinically for infection given sepsis evaluation. 2. Small calcified granuloma in the right upper lobe. 3. Bulky calcified right mediastinal lymph node, likely sequela of prior granulomatous disease. Electronically signed by: Evalene Coho MD 02/10/2024 12:27 PM EST RP Workstation: [id number]   CT Cervical Spine Wo Contrast Result Date: 02/10/2024 EXAM: CT CERVICAL SPINE WITHOUT CONTRAST 02/10/2024 11:53:00 AM TECHNIQUE: CT of the cervical spine was performed without the administration of intravenous contrast. Multiplanar reformatted images are provided for review. Automated exposure control, iterative reconstruction, and/or weight based adjustment of the mA/kV was utilized to reduce the radiation dose to as low as reasonably achievable. COMPARISON: CT of the cervical spine dated 02/04/2024. CLINICAL HISTORY: Polytrauma, blunt. FINDINGS: CERVICAL SPINE: BONES AND ALIGNMENT: No acute fracture or traumatic  malalignment. There is grade 1 anterolisthesis again demonstrated at C4-C5. DEGENERATIVE CHANGES: There are also anterior bridging osteophytes again seen extending from C2 to T1. There is fusion of the right facet joints at C3-C4 and the left facet joints at C2-C3 and C3-C4. There is moderate-to-severe left neural foraminal stenosis at C4-C5 secondary to uncovertebral joint-type hypertrophy and facet hypertrophy. There is  mild-to-moderate central spinal canal stenosis and moderate bilateral neural foraminal stenosis at C5-C6. SOFT TISSUES: No prevertebral soft tissue swelling. There are calcifications within the carotid bulbs bilaterally. IMPRESSION: 1. No acute abnormality of the cervical spine related to the polytrauma. 2. Grade 1 anterolisthesis at C4-5, anterior bridging osteophytes from C2 to T1, and fusion of the right facet joints at C3-4 and the left facet joints at C2-3 and C3-4. 3. Moderate-to-severe left neural foraminal stenosis at C4-5 secondary to uncovertebral joint-type hypertrophy and facet hypertrophy. 4. Mild-to-moderate central spinal canal stenosis and moderate bilateral neural foraminal stenosis at C5-6. 5. Bilateral carotid bulb calcifications, which may reflect atherosclerosis. Consider correlation with cardiovascular risk factors and, if clinically indicated, carotid ultrasound. Electronically signed by: Evalene Coho MD 02/10/2024 12:04 PM EST RP Workstation: [id number]   CT Head Wo Contrast Result Date: 02/10/2024 EXAM: CT HEAD WITHOUT 02/10/2024 11:53:00 AM TECHNIQUE: CT of the head was performed without the administration of intravenous contrast. Automated exposure control, iterative reconstruction, and/or weight based adjustment of the mA/kV was utilized to reduce the radiation dose to as low as reasonably achievable. COMPARISON: 02/04/2024 CLINICAL HISTORY: Polytrauma, blunt FINDINGS: BRAIN AND VENTRICLES: No acute intracranial hemorrhage. No mass effect or midline shift. No extra-axial fluid collection. Periventricular white matter decreased attenuation consistent with small vessel ischemic changes. Right frontal lobe encephalomalacia consistent with old infarct. Prominent ventricles, sulci and cisterns consistent with age related involutional changes. ORBITS: No acute abnormality. SINUSES AND MASTOIDS: No acute abnormality. SOFT TISSUES AND SKULL: No acute skull fracture. No acute soft tissue  abnormality. IMPRESSION: 1. No acute intracranial abnormality. 2. Right frontal lobe encephalomalacia consistent with old infarct. 3. Periventricular white matter decreased attenuation consistent with small vessel ischemic changes. 4. Prominent ventricles, sulci, and cisterns consistent with age-related involutional changes. Electronically signed by: Evalene Coho MD 02/10/2024 11:56 AM EST RP Workstation: [id number]   Data Reviewed: Relevant notes from primary care and specialist visits, past discharge summaries as available in EHR, including Care Everywhere . Prior diagnostic testing as pertinent to current admission diagnoses, Updated medications and problem lists for reconciliation .ED course, including vitals, labs, imaging, treatment and response to treatment,Triage notes, nursing and pharmacy notes and ED provider's notes.Notable results as noted in HPI.Discussed case with EDMD/ ED APP/ or Specialty MD on call and as needed.  Assessment & Plan  Pt is altered   >>Fall: Secondary to hypotension due to medications and decreased p.o. intake.  Less likely sepsis.   >>Hypotension/Essential hypertension: Hold PTA blood pressure meds which include Zestril  10 mg.   >>Hypernatremia: 2/2 to dehydration. Start 1/2 ns and follow BP.   >>AKI: Lab Results  Component Value Date   CREATININE 1.40 (H) 02/10/2024   CREATININE 1.48 (H) 02/10/2024   CREATININE 1.35 (H) 02/04/2024  Avoid contrast and renally dose needed meds.    >> Metabolic Encephalopathy: Attributed to patient's sepsis presentation, pneumonia, electrolyte abnormalities.  Will follow culture and sensitivity.  Neurochecks, aspiration precaution, fall precaution.  May need a sitter at bedside.   >>?Pneumonia/Sepsis: Patient started on Rocephin  in the emergency room will obtain a procalcitonin level and follow.  Will obtain  lactic acid and follow.   >> Mixed Alzheimer's and vascular dementia/with behavioral  disturbances: Continue patient's donepezil , memantine , Lamictal , Depakote , Geodon , trazodone, once med reconciliation is available. Lives in a memory care unit.   >> Parkinson's Disease: Resume patient's carbidopa  levodopa  25-2 50 once patient passes swallow evaluation.   >>CHB S/P medtronic PPM: EKG shows A-fib at 99 bpm with LVH and a prolonged QT of 522. Will avoid Zofran .   >> Chronic A-fib: No anticoagulation.  Suspect secondary to Due to patient's risk of falls bleeding.   DVT prophylaxis:  Heparin .  Consults:  None.  Advance Care Planning:    Code Status: Limited: Do not attempt resuscitation (DNR) -DNR-LIMITED -Do Not Intubate/DNI    Family Communication:  Patient's daughter : Odysseus Cada : [phone].  Disposition Plan:  Memory care unit.  Severity of Illness: The appropriate patient status for this patient is OBSERVATION. Observation status is judged to be reasonable and necessary in order to provide the required intensity of service to ensure the patient's safety. The patient's presenting symptoms, physical exam findings, and initial radiographic and laboratory data in the context of their medical condition is felt to place them at decreased risk for further clinical deterioration. Furthermore, it is anticipated that the patient will be medically stable for discharge from the hospital within 2 midnights of admission.   Unresulted Labs (From admission, onward)     Start     Ordered   02/10/24 1404  Respiratory (~20 pathogens) panel by PCR  (Respiratory panel by PCR (~20 pathogens, ~24 hr TAT)  w precautions)  Once,   R        02/10/24 1404   02/10/24 1402  HIV Antibody (routine testing w rflx)  (HIV Antibody (Routine testing w reflex) panel)  Once,   R        02/10/24 1404   02/10/24 1353  Osmolality  Once,   URGENT        02/10/24 1352   02/10/24 1352  CK  Add-on,   AD        02/10/24 1351   02/10/24 1336  Blood culture (routine x 2)  BLOOD CULTURE X 2,    R      02/10/24 1335   02/10/24 1124  Urinalysis, Routine w reflex microscopic -Urine, Clean Catch  (ED ALOC)  Once,   URGENT       Question:  Specimen Source  Answer:  Urine, Clean Catch   02/10/24 1124           Meds ordered this encounter  Medications   sodium chloride  0.9 % bolus 1,000 mL   DISCONTD: cefTRIAXone  (ROCEPHIN ) 2 g in sodium chloride  0.9 % 100 mL IVPB    Antibiotic Indication::   CAP   DISCONTD: azithromycin  (ZITHROMAX ) 500 mg in sodium chloride  0.9 % 250 mL IVPB   sodium chloride  0.45 % bolus 1,000 mL   cefTRIAXone  (ROCEPHIN ) 2 g in sodium chloride  0.9 % 100 mL IVPB    Antibiotic Indication::   CAP   heparin  injection 5,000 Units   azithromycin  (ZITHROMAX ) 500 mg in sodium chloride  0.9 % 250 mL IVPB    Antibiotic Indication::   CAP   Orders Placed This Encounter  Procedures   Blood culture (routine x 2)   Respiratory (~20 pathogens) panel by PCR   DG Chest Portable 1 View   CT Head Wo Contrast   CT Cervical Spine Wo Contrast   CT Chest Wo Contrast   CBC with Differential  Urinalysis, Routine w reflex microscopic -Urine, Clean Catch   Comprehensive metabolic panel with GFR   CK   Osmolality   HIV Antibody (routine testing w rflx)   Diet NPO time specified   ED Cardiac monitoring   Swallow screen   Vital signs   Notify physician (specify) Before calling MD verify blood pressure manually.   Refer to Sidebar Report Mobility Protocol for Adult Inpatient   Complete Pneumococcal/Influenza Immunization assessment   Intake and Output   Check Pulse Oximetry while ambulating   Apply Pneumonia Care Plan   Elevate head of bed   If diabetic or glucose greater than 140 notify MD to place Glycemic Control Order Set   Maintain IV access   Initiate Oral Care Protocol   Initiate Carrier Fluid Protocol   Turn, cough, deep breathe, Incentive spirometry every 2 hours while awake.   Nurse to provide smoking / tobacco cessation education   RN may order General  Admission PRN Orders utilizing General Admission PRN medications (through manage orders) for the following patient needs: allergy symptoms (Claritin), cold sores (Carmex), cough (Robitussin DM), eye irritation (Liquifilm Tears), hemorrhoids (Tucks), indigestion (Maalox), minor skin irritation (Hydrocortisone Cream), muscle pain Lucienne Gay), nose irritation (saline nasal spray) and sore throat (Chloraseptic spray).   Bed rest   Cardiac Monitoring - Continuous Indefinite   Do not attempt resuscitation (DNR)- Limited -Do Not Intubate (DNI)   Activate Code Medical.  Code Medical Patients are identified by an ED Provider and need the prioritization of an emergent CT scan and must be transported by a RN with a monitor.   Consult to hospitalist   Droplet precaution   ED Pulse oximetry, continuous   Oxygen therapy Mode or (Route): Nasal cannula; Liters Per Minute: 2; Keep O2 saturation between: greater than 92 %   CBG monitoring, ED   I-stat chem 8, ED (not at [facility], [facility] or [facility])   I-Stat CG4 Lactic Acid   EKG 12-Lead   Insert peripheral IV   Place in observation (patient's expected length of stay will be less than 2 midnights)   Aspiration precautions   Fall precautions   Author: Mario LULLA Blanch, MD 12 pm- 8 pm. [facility]. 02/10/2024 2:48 PM Please note for any communication after hours contact [facility] Assigned provider on call on Amion.

## 2024-02-10 NOTE — Hospital Course (Signed)
 [name] [name]

## 2024-02-10 NOTE — ED Notes (Signed)
 Patel MD notified of critical lab value serum osmality  [phone] via secure chat.

## 2024-02-10 NOTE — ED Notes (Signed)
 CCMD called, pt on monitor

## 2024-02-10 NOTE — Progress Notes (Signed)
° °  Brief Progress Note   _____________________________________________________________________________________________________________  Patient Name: [name] [name] Patient DOB: [birth year] Date: @TODAY @      Data: Reviewed vital signs, labs, and notes.    Action: No action required at this time.    Response:  Pt with AMS. Would be unable to consent to transfer to [facility] if bed available.   _____________________________________________________________________________________________________________  [facility] RN Expeditor Ahnna Dungan S Lariah Fleer Please contact us  directly via secure chat (search for [facility]) or by calling us  at [phone] [facility]).

## 2024-02-10 NOTE — ED Provider Notes (Signed)
 [place] EMERGENCY DEPARTMENT AT [facility] Provider Note   CSN: [id number] Arrival date & time: 02/10/24  1113     Patient presents with: [name] [name] is a 78 y.o. male.   HPI    Presents from outside facility.  Fall.  Hit his head.  Increasing [place] status and route to the hospital.  GCS of around 8 per EMS.   Per Wife: He on hospice. Put on hospice about three weeks ago. First part of November. It was wife and children decision.  She confirmed that patient would not want CPR or intubation.  She states that patient would want IV fluids as well as imaging to assess for any kind of etiology of his presentation.  Discussed with daughter [name] [name] as well.  She is in  agreement with this  Previous medical history reviewed : Patient last admitted back in October 2025.  Sepsis.  Chronic A-fib.  Sepsis secondary to pneumonia.  Acute metabolic encephalopathy.  Advanced dementia.      Prior to Admission medications   Medication Sig Start Date End Date Taking? Authorizing Provider  acetaminophen  (TYLENOL ) 325 MG tablet Take 650 mg by mouth in the morning and at bedtime.   Yes [provider]  carbidopa -levodopa  (SINEMET  IR) 25-250 MG tablet Take 1 tablet by mouth 2 (two) times daily. 11/25/22  Yes [provider]  divalproex  (DEPAKOTE ) 250 MG DR tablet Take 250 mg by mouth 3 (three) times daily. 11/25/22  Yes [provider]  donepezil  (ARICEPT ) 10 MG tablet Take 10 mg by mouth at bedtime. 11/25/22  Yes [provider]  lamoTRIgine  (LAMICTAL ) 150 MG tablet Take 150 mg by mouth 2 (two) times daily. 11/25/22  Yes [provider]  lisinopril  (ZESTRIL ) 10 MG tablet Take 1 tablet (10 mg total) by mouth every evening. Please call our office to schedule an yearly appointment with Dr. Court for November 2024 before anymore refills. [phone]. Thank you 1st attempt Patient taking differently: Take 20 mg by mouth every evening.  12/25/22  Yes Court Dorn PARAS, MD  memantine  (NAMENDA ) 10 MG tablet Take 10 mg by mouth 2 (two) times daily. 11/25/22  Yes [provider]  mirtazapine  (REMERON ) 15 MG tablet Take 15 mg by mouth at bedtime. 11/25/22  Yes [provider]  REPATHA SURECLICK 140 MG/ML SOAJ Inject 140 mg into the skin every 14 (fourteen) days. 11/25/22  Yes [provider]  torsemide  (DEMADEX ) 10 MG tablet Take 10 mg by mouth daily.   Yes [provider]  traZODone (DESYREL) 150 MG tablet Take 150 mg by mouth at bedtime. 10/14/23  Yes [provider]  ziprasidone  (GEODON ) 40 MG capsule Take 40 mg by mouth 2 (two) times daily with a meal.   Yes [provider]  atorvastatin  (LIPITOR) 40 MG tablet Take 1 tablet (40 mg total) by mouth every evening. Please call our office to schedule an yearly appointment with Dr. Court for November 2024 before anymore refills. [phone] Patient not taking: Reported on 02/10/2024 12/25/22   Court Dorn PARAS, MD  folic acid  (FOLVITE ) 1 MG tablet Take 1 mg by mouth at bedtime. Patient not taking: Reported on 02/10/2024 11/25/22   [provider]  ziprasidone  (GEODON ) 60 MG capsule Take 60 mg by mouth in the morning. To be given in addition to 40mg  in the evening for a total daily dose of 100mg . Patient not taking: Reported on 02/10/2024    [provider]  Allergies: Latex    Review of Systems  Unable to perform ROS: Dementia  Gastrointestinal:  Negative for vomiting.  Skin:  Negative for color change and rash.  All other systems reviewed and are negative.   Updated Vital Signs BP (!) 151/83   Pulse 81   Temp (!) 97.4 F (36.3 C) (Temporal)   Resp 15   SpO2 100%   Physical Exam Vitals and nursing note reviewed.  Constitutional:      General: He is not in acute distress.    Appearance: He is well-developed. He is ill-appearing.  HENT:     Head: Normocephalic and atraumatic.  Eyes:      Conjunctiva/sclera: Conjunctivae normal.  Cardiovascular:     Rate and Rhythm: Normal rate and regular rhythm.     Heart sounds: No murmur heard. Pulmonary:     Effort: Pulmonary effort is normal. No respiratory distress.     Breath sounds: Normal breath sounds.  Abdominal:     Palpations: Abdomen is soft.     Tenderness: There is no abdominal tenderness.  Musculoskeletal:        General: No swelling.     Cervical back: Neck supple.  Skin:    General: Skin is warm and dry.     Capillary Refill: Capillary refill takes less than 2 seconds.  Neurological:     Mental Status: He is alert.     GCS: GCS eye subscore is 1. GCS verbal subscore is 1. GCS motor subscore is 3.  Psychiatric:        Mood and Affect: Mood normal.     (all labs ordered are listed, but only abnormal results are displayed) Labs Reviewed  COMPREHENSIVE METABOLIC PANEL WITH GFR - Abnormal; Notable for the following components:      Result Value   Sodium 150 (*)    CO2 34 (*)    Glucose, Bld 122 (*)    BUN 30 (*)    Creatinine, Ser 1.48 (*)    Calcium  10.5 (*)    Total Protein 6.4 (*)    Albumin 3.0 (*)    GFR, Estimated 48 (*)    All other components within normal limits  CBG MONITORING, ED - Abnormal; Notable for the following components:   Glucose-Capillary 123 (*)    All other components within normal limits  I-STAT CHEM 8, ED - Abnormal; Notable for the following components:   Sodium 149 (*)    BUN 40 (*)    Creatinine, Ser 1.40 (*)    Glucose, Bld 110 (*)    Hemoglobin 12.9 (*)    HCT 38.0 (*)    All other components within normal limits  CULTURE, BLOOD (ROUTINE X 2)  CULTURE, BLOOD (ROUTINE X 2)  RESPIRATORY PANEL BY PCR  CBC WITH DIFFERENTIAL/PLATELET  URINALYSIS, ROUTINE W REFLEX MICROSCOPIC  CK  OSMOLALITY  HIV ANTIBODY (ROUTINE TESTING W REFLEX)  I-STAT CG4 LACTIC ACID, ED    EKG: EKG Interpretation Date/Time:  Wednesday February 10 2024 11:19:07 EST Ventricular Rate:  99 PR  Interval:    QRS Duration:  101 QT Interval:  405 QTC Calculation: 520 R Axis:   70  Text Interpretation: Atrial fibrillation LVH with secondary repolarization abnormality Prolonged QT interval Confirmed by Simon Rea [phone]) on 02/10/2024 11:30:37 AM  Radiology: [facility] Chest Portable 1 View Result Date: 02/10/2024 EXAM: 1 VIEW(S) XRAY OF THE CHEST 02/10/2024 12:01:00 PM COMPARISON: Comparison 6 days ago. CLINICAL HISTORY: ams FINDINGS: LUNGS AND PLEURA: No focal pulmonary  opacity. No pleural effusion. No pneumothorax. HEART AND MEDIASTINUM: Left-sided pacemaker is unchanged. Stable cardiomediastinal silhouette. BONES AND SOFT TISSUES: No acute osseous abnormality. IMPRESSION: 1. No acute cardiopulmonary process. Electronically signed by: Lynwood Seip MD 02/10/2024 12:28 PM EST RP Workstation: [id number]   CT Chest Wo Contrast Result Date: 02/10/2024 EXAM: CT CHEST WITHOUT CONTRAST 02/10/2024 11:53:00 AM TECHNIQUE: CT of the chest was performed without the administration of intravenous contrast. Multiplanar reformatted images are provided for review. Automated exposure control, iterative reconstruction, and/or weight based adjustment of the mA/kV was utilized to reduce the radiation dose to as low as reasonably achievable. COMPARISON: CT of the chest dated 12/25/2023. CLINICAL HISTORY: Sepsis workup. Eval for pneumonia. FINDINGS: MEDIASTINUM: Heart: Moderate calcific coronary artery disease. Single lead cardiac pacers present. Pericardium is unremarkable. The central airways are clear. Moderate calcific atheromatous disease within the thoracic aorta. LYMPH NODES: Bulky calcified right mediastinal lymph node. No hilar or axillary lymphadenopathy. LUNGS AND PLEURA: Mild ground-glass and reticular opacities present dependently within the lower lobes, likely representing a combination of mild edema and atelectasis. Small calcified granuloma present laterally within the right upper lobe. No pleural effusion  or pneumothorax. SOFT TISSUES/BONES: Mild degenerative changes throughout the thoracic spine. No acute abnormality of the bones or soft tissues. UPPER ABDOMEN: Limited images of the upper abdomen demonstrates no acute abnormality. IMPRESSION: 1. Mild dependent lower lobe ground-glass and reticular opacities, favor mild edema and atelectasis; correlate clinically for infection given sepsis evaluation. 2. Small calcified granuloma in the right upper lobe. 3. Bulky calcified right mediastinal lymph node, likely sequela of prior granulomatous disease. Electronically signed by: Evalene Coho MD 02/10/2024 12:27 PM EST RP Workstation: [id number]   CT Cervical Spine Wo Contrast Result Date: 02/10/2024 EXAM: CT CERVICAL SPINE WITHOUT CONTRAST 02/10/2024 11:53:00 AM TECHNIQUE: CT of the cervical spine was performed without the administration of intravenous contrast. Multiplanar reformatted images are provided for review. Automated exposure control, iterative reconstruction, and/or weight based adjustment of the mA/kV was utilized to reduce the radiation dose to as low as reasonably achievable. COMPARISON: CT of the cervical spine dated 02/04/2024. CLINICAL HISTORY: Polytrauma, blunt. FINDINGS: CERVICAL SPINE: BONES AND ALIGNMENT: No acute fracture or traumatic malalignment. There is grade 1 anterolisthesis again demonstrated at C4-C5. DEGENERATIVE CHANGES: There are also anterior bridging osteophytes again seen extending from C2 to T1. There is fusion of the right facet joints at C3-C4 and the left facet joints at C2-C3 and C3-C4. There is moderate-to-severe left neural foraminal stenosis at C4-C5 secondary to uncovertebral joint-type hypertrophy and facet hypertrophy. There is mild-to-moderate central spinal canal stenosis and moderate bilateral neural foraminal stenosis at C5-C6. SOFT TISSUES: No prevertebral soft tissue swelling. There are calcifications within the carotid bulbs bilaterally. IMPRESSION: 1. No acute  abnormality of the cervical spine related to the polytrauma. 2. Grade 1 anterolisthesis at C4-5, anterior bridging osteophytes from C2 to T1, and fusion of the right facet joints at C3-4 and the left facet joints at C2-3 and C3-4. 3. Moderate-to-severe left neural foraminal stenosis at C4-5 secondary to uncovertebral joint-type hypertrophy and facet hypertrophy. 4. Mild-to-moderate central spinal canal stenosis and moderate bilateral neural foraminal stenosis at C5-6. 5. Bilateral carotid bulb calcifications, which may reflect atherosclerosis. Consider correlation with cardiovascular risk factors and, if clinically indicated, carotid ultrasound. Electronically signed by: Evalene Coho MD 02/10/2024 12:04 PM EST RP Workstation: [id number]   CT Head Wo Contrast Result Date: 02/10/2024 EXAM: CT HEAD WITHOUT 02/10/2024 11:53:00 AM TECHNIQUE: CT of the head was performed without the  administration of intravenous contrast. Automated exposure control, iterative reconstruction, and/or weight based adjustment of the mA/kV was utilized to reduce the radiation dose to as low as reasonably achievable. COMPARISON: 02/04/2024 CLINICAL HISTORY: Polytrauma, blunt FINDINGS: BRAIN AND VENTRICLES: No acute intracranial hemorrhage. No mass effect or midline shift. No extra-axial fluid collection. Periventricular white matter decreased attenuation consistent with small vessel ischemic changes. Right frontal lobe encephalomalacia consistent with old infarct. Prominent ventricles, sulci and cisterns consistent with age related involutional changes. ORBITS: No acute abnormality. SINUSES AND MASTOIDS: No acute abnormality. SOFT TISSUES AND SKULL: No acute skull fracture. No acute soft tissue abnormality. IMPRESSION: 1. No acute intracranial abnormality. 2. Right frontal lobe encephalomalacia consistent with old infarct. 3. Periventricular white matter decreased attenuation consistent with small vessel ischemic changes. 4. Prominent  ventricles, sulci, and cisterns consistent with age-related involutional changes. Electronically signed by: Evalene Coho MD 02/10/2024 11:56 AM EST RP Workstation: [id number]     Procedures   Medications Ordered in the ED  sodium chloride  0.45 % bolus 1,000 mL (has no administration in time range)  cefTRIAXone  (ROCEPHIN ) 2 g in sodium chloride  0.9 % 100 mL IVPB (has no administration in time range)  heparin  injection 5,000 Units (has no administration in time range)  azithromycin  (ZITHROMAX ) 500 mg in sodium chloride  0.9 % 250 mL IVPB (has no administration in time range)  sodium chloride  0.9 % bolus 1,000 mL (0 mLs Intravenous Stopped 02/10/24 1257)                                    Medical Decision Making Amount and/or Complexity of Data Reviewed Labs: ordered. Radiology: ordered.  Risk Decision regarding hospitalization.     HPI:    Presents from outside facility.  Fall.  Hit his head.  Increasing Alterman status and route to the hospital.  GCS of around 8 per EMS.   Per Wife: He on hospice. Put on hospice about three weeks ago. First part of November. It was wife and children decision.  She confirmed that patient would not want CPR or intubation.  She states that patient would want IV fluids as well as imaging to assess for any kind of etiology of his presentation.  Discussed with daughter [name] [name] as well.  She is in  agreement with this  Previous medical history reviewed : Patient last admitted back in October 2025.  Sepsis.  Chronic A-fib.  Sepsis secondary to pneumonia.  Acute metabolic encephalopathy.  Advanced dementia.  MDM:   Exam, patient hypotensive.  Systolics in the 80s.  Tachycardic as well with rate around 100.  As above, spoke to both wife and daughter.  No CPR or intubation.  Would want IV fluids and antibiotics as well as further workup.  GCS of around 5.  Will not  intubate based off DNR status confirmed by wife and daughter as well as paperwork at  bedside.  Reevaluation:   Upon reexamination, patient hemodynamically stable.  Remains altered.  Responding more to pain.  GCS of around 7.  Once again, will not intubate given DNR status confirmed by wife and daughter.  .  Labs shows some mild AKI.  In the setting of lack of p.o. intake given altered mental status.  Patient was given a liter of fluid and subsequently maps improved as well as heart rate improved.   Did obtain CT head and CT cervical spine given reported fall.  Unremarkable.  T chest did show possible atelectasis versus infiltrates in left lung base.  Given patient's presentation, will cover for pneumonia with ceftriaxone  and azithromycin .  Pending UA.  Patient vital signs completely normalized after liter of fluid.   I tried to call his facility and has been staying at 4-5 times.  Unable to reach anybody to discuss his recent symptoms.  Patient admitted     Interventions: 1000 cc   EKG Interpreted by Me: sinus    Cardiac Tele Interpreted by Me: sinus    I have independently interpreted the CXR  and CT  images and agree with the radiologist finding   Social Determinant of Health: Dementia    Disposition and Follow Up: admit  CRITICAL CARE Performed by: Lavonia LOISE Pat   Total critical care time: 45 minutes  Critical care time was exclusive of separately billable procedures and treating other patients.  Critical care was necessary to treat or prevent imminent or life-threatening deterioration.  Critical care was time spent personally by me on the following activities: development of treatment plan with patient and/or surrogate as well as nursing, discussions with consultants, evaluation of patient's response to treatment, examination of patient, obtaining history from patient or surrogate, ordering and performing treatments and interventions, ordering and review of laboratory studies, ordering and review of radiographic studies, pulse oximetry and  re-evaluation of patient's condition.\      Final diagnoses:  Disorientation  Pneumonia of left lower lobe due to infectious organism  Hypotension due to hypovolemia    ED Discharge Orders     None          Pat Lavonia LOISE, MD 02/10/24 1520

## 2024-02-11 DIAGNOSIS — R4182 Altered mental status, unspecified: Secondary | ICD-10-CM | POA: Diagnosis not present

## 2024-02-11 DIAGNOSIS — R410 Disorientation, unspecified: Secondary | ICD-10-CM | POA: Diagnosis present

## 2024-02-11 LAB — COMPREHENSIVE METABOLIC PANEL WITH GFR
ALT: 9 U/L (ref 0–44)
AST: 21 U/L (ref 15–41)
Albumin: 2.7 g/dL — ABNORMAL LOW (ref 3.5–5.0)
Alkaline Phosphatase: 86 U/L (ref 38–126)
Anion gap: 8 (ref 5–15)
BUN: 22 mg/dL (ref 8–23)
CO2: 27 mmol/L (ref 22–32)
Calcium: 9.2 mg/dL (ref 8.9–10.3)
Chloride: 94 mmol/L — ABNORMAL LOW (ref 98–111)
Creatinine, Ser: 1.06 mg/dL (ref 0.61–1.24)
GFR, Estimated: >60 mL/min (ref >60)
Glucose, Bld: 581 mg/dL (ref 70–99)
Potassium: 3.7 mmol/L (ref 3.5–5.1)
Sodium: 129 mmol/L — ABNORMAL LOW (ref 135–145)
Total Bilirubin: 0.4 mg/dL (ref 0.0–1.2)
Total Protein: 6 g/dL — ABNORMAL LOW (ref 6.5–8.1)

## 2024-02-11 LAB — GLUCOSE, CAPILLARY
Glucose-Capillary: 88 mg/dL (ref 70–99)
Glucose-Capillary: 101 mg/dL — ABNORMAL HIGH (ref 70–99)
Glucose-Capillary: 60 mg/dL — ABNORMAL LOW (ref 70–99)
Glucose-Capillary: 55 mg/dL — ABNORMAL LOW (ref 70–99)
Glucose-Capillary: 120 mg/dL — ABNORMAL HIGH (ref 70–99)
Glucose-Capillary: 87 mg/dL (ref 70–99)
Glucose-Capillary: 92 mg/dL (ref 70–99)
Glucose-Capillary: 61 mg/dL — ABNORMAL LOW (ref 70–99)
Glucose-Capillary: 140 mg/dL — ABNORMAL HIGH (ref 70–99)

## 2024-02-11 LAB — URINALYSIS, ROUTINE W REFLEX MICROSCOPIC
Bilirubin Urine: NEGATIVE
Glucose, UA: NEGATIVE mg/dL
Hgb urine dipstick: NEGATIVE
Ketones, ur: NEGATIVE mg/dL
Leukocytes,Ua: NEGATIVE
Nitrite: NEGATIVE
Protein, ur: NEGATIVE mg/dL
Specific Gravity, Urine: 1.021 (ref 1.005–1.030)
pH: 5 (ref 5.0–8.0)

## 2024-02-11 LAB — CBC WITH DIFFERENTIAL/PLATELET
Abs Immature Granulocytes: 0.01 K/uL (ref 0.00–0.07)
Basophils Absolute: 0 K/uL (ref 0.0–0.1)
Basophils Relative: 1 %
Eosinophils Absolute: 0.3 K/uL (ref 0.0–0.5)
Eosinophils Relative: 5 %
HCT: 35.7 % — ABNORMAL LOW (ref 39.0–52.0)
Hemoglobin: 11.2 g/dL — ABNORMAL LOW (ref 13.0–17.0)
Immature Granulocytes: 0 %
Lymphocytes Relative: 17 %
Lymphs Abs: 0.9 K/uL (ref 0.7–4.0)
MCH: 30.7 pg (ref 26.0–34.0)
MCHC: 31.4 g/dL (ref 30.0–36.0)
MCV: 97.8 fL (ref 80.0–100.0)
Monocytes Absolute: 0.4 K/uL (ref 0.1–1.0)
Monocytes Relative: 7 %
Neutro Abs: 3.7 K/uL (ref 1.7–7.7)
Neutrophils Relative %: 70 %
Platelets: 134 K/uL — ABNORMAL LOW (ref 150–400)
RBC: 3.65 MIL/uL — ABNORMAL LOW (ref 4.22–5.81)
RDW: 13.9 % (ref 11.5–15.5)
WBC: 5.3 K/uL (ref 4.0–10.5)
nRBC: 0 % (ref 0.0–0.2)

## 2024-02-11 MED ORDER — DEXTROSE 50 % IV SOLN
12.5000 g | INTRAVENOUS | Status: AC
  Administered 2024-02-11: 12.5 g via INTRAVENOUS

## 2024-02-11 MED ORDER — DEXTROSE IN LACTATED RINGERS 5 % IV SOLN: INTRAVENOUS | Status: DC

## 2024-02-11 MED ORDER — DEXTROSE 50 % IV SOLN
INTRAVENOUS | Status: AC
  Administered 2024-02-11: 25 mL
  Filled 2024-02-11: qty 50

## 2024-02-11 MED ORDER — DEXTROSE 50 % IV SOLN
INTRAVENOUS | Status: AC
  Administered 2024-02-11: 12.5 g via INTRAVENOUS
  Filled 2024-02-11: qty 50

## 2024-02-11 MED ORDER — DEXTROSE 50 % IV SOLN
12.5000 g | INTRAVENOUS | Status: AC
  Administered 2024-02-11: 12.5 g via INTRAVENOUS
  Filled 2024-02-11: qty 50

## 2024-02-11 MED ORDER — INSULIN ASPART 100 UNIT/ML IJ SOLN
8.0000 [IU] | Freq: Once | INTRAMUSCULAR | Status: AC
  Administered 2024-02-11: 8 [IU] via SUBCUTANEOUS
  Filled 2024-02-11: qty 8

## 2024-02-11 MED ORDER — LACTATED RINGERS IV SOLN: INTRAVENOUS | Status: DC

## 2024-02-11 MED ORDER — HEPARIN SODIUM (PORCINE) 5000 UNIT/ML IJ SOLN
5000.0000 [IU] | Freq: Three times a day (TID) | INTRAMUSCULAR | Status: DC
  Administered 2024-02-11 – 2024-02-12 (×3): 5000 [IU] via SUBCUTANEOUS
  Filled 2024-02-11 (×2): qty 1

## 2024-02-11 MED ADMIN — Carbidopa & Levodopa Tab 25-250 MG: 1 | ORAL | NDC 72888010801

## 2024-02-11 MED FILL — Carbidopa & Levodopa Tab 25-250 MG: 1.0000 | ORAL | Qty: 1 | Status: AC

## 2024-02-11 NOTE — Progress Notes (Signed)
 Hypoglycemic Event  CBG: 55  Treatment: dextrose  12.5 gm/25 ml  Symptoms: lethargy  Follow-up CBG: Time:1846 CBG Result:120  Possible Reasons for Event: poor PO intake  Comments/MD notified:dr. Samtani ordered an additional 12.5 grams/25 ml of dextrose  and change IVF to D5LR. Pt more alert at 1846 and blood sugar improved p MD interventions. Accuchecks q 2 hours at present    [name] [name]

## 2024-02-11 NOTE — TOC Initial Note (Signed)
 Transition of Care [name]) - Initial/Assessment Note    Patient Details  Name: [name] [name] MRN: [id number] Date of Birth: [birth year]  Transition of [facility] CM/SW Contact:    [name] [name], LCSWA Phone Number: 02/11/2024, 10:21 AM  Clinical Narrative: Pt is disoriented x4 and from [facility] (per [place] [facility] [place]).   CSW spoke to pt's wife [name] [name] via phone who is agreeable to the pt return to [facility]. [name] [name] stated that the pt uses a wc and is dependent with ADL's besides feeding. Pt has a PCP but has not seen them in the last year d/t it being too difficult to transport him per [facility].   Pt receives SSI as income and [name] [name] reports no SDOH concerns. Pt has a MH dx of Bipolar disorder and no HX of SU. Wife reports no needs.  CSW will continue to follow.         Patient Goals and CMS Choice            Expected Discharge Plan and Services                                              Prior Living Arrangements/Services                       Activities of Daily Living   ADL Screening (condition at time of admission) Independently performs ADLs?: No Does the patient have a NEW difficulty with bathing/dressing/toileting/self-feeding that is expected to last >3 days?: Yes (Initiates electronic notice to provider for possible OT consult) Does the patient have a NEW difficulty with getting in/out of bed, walking, or climbing stairs that is expected to last >3 days?: Yes (Initiates electronic notice to provider for possible PT consult) Does the patient have a NEW difficulty with communication that is expected to last >3 days?: Yes (Initiates electronic notice to provider for possible SLP consult)  Permission Sought/Granted                  Emotional Assessment              Admission diagnosis:  Disorientation [R41.0] Pneumonia of left lower lobe due to infectious organism [J18.9] Hypotension due to hypovolemia [E86.1] AMS  (altered mental status) [R41.82] Patient Active Problem List   Diagnosis Date Noted   AMS (altered mental status) 02/10/2024   Sepsis (HCC) 12/25/2023   RUL Lung nodules and cecal/mesenteric mass 12/25/2023   Acute metabolic encephalopathy 12/25/2023   Bradycardia 04/25/2023   S/P placement of cardiac pacemaker 04/24/2023   Concussion 01/09/2023   OSA (obstructive sleep apnea) 01/09/2023   CAD (coronary artery disease) of artery bypass graft 01/09/2023   Dementia with behavioral disturbance (HCC) 01/09/2023   Atrial fibrillation (HCC) 01/09/2023   Chronic anticoagulation 01/09/2023   HTN (hypertension) 01/09/2023   Bipolar disorder (HCC) 01/09/2023   Dementia with behavioral disturbance (HCC) 01/04/2023   Fall at home, initial encounter 12/18/2022   Anemia of chronic disease 12/18/2022   Generalized weakness 12/17/2022   History of loop recorder - STJ 04/15/2022   Syncope, cardiogenic 11/24/2021   Intermittent complete heart block (HCC) 11/24/2021   Obstructive sleep apnea 02/14/2021   Urinary urgency 07/11/2020   History of total knee arthroplasty 12/16/2018   Osteoarthritis of left knee 06/04/2017   B12 deficiency 03/19/2017   Loss of  memory 03/18/2017   Diplopia 08/06/2016   History of atrial fibrillation 08/06/2016   History of bipolar disorder 08/06/2016   History of restless legs syndrome 08/06/2016   Weakness 02/10/2016   Essential tremor 07/17/2015   Mixed Alzheimer's and vascular dementia (HCC) 10/28/2013   Parkinsonian features 03/31/2013   History of stroke 03/23/2013   Hyperlipidemia 03/20/2013   Brain TIA 03/18/2013   Gait instability 03/17/2013   Chronic atrial fibrillation (HCC) 03/17/2013   Essential hypertension 03/17/2013   PCP:  [name] Nottingham, MD Pharmacy:   [name] [address] [place] [address] Phone: [phone] Fax: [phone]     Social Drivers of Health (SDOH) Social History: SDOH Screenings    Food Insecurity: Patient Unable To Answer (12/27/2023)  Housing: Patient Unable To Answer (12/27/2023)  Transportation Needs: Patient Unable To Answer (12/27/2023)  Utilities: Patient Unable To Answer (12/27/2023)  Social Connections: Patient Unable To Answer (12/27/2023)  Tobacco Use: Medium Risk (02/04/2024)   SDOH Interventions:     Readmission Risk Interventions     No data to display

## 2024-02-11 NOTE — Progress Notes (Addendum)
 [facility]   ROUNDING   NOTE MERCURY ROCK FMW:[id number]  DOB: 10-22-1945  DOA: 02/10/2024  PCP: Clarice Nottingham, MD  02/11/2024,12:04 PM  LOS: 0 days    Code Status: DNR hospice patient     [facility]: [facility]   78 year old male known history atrial fibrillation sick sinus syndrome on pacemaker without anticoagulation Parkinson's disease on Sinemet  followed in [place] with Dr. Lane, located by TIA bipolar Known cecal mass-?  Carcinoid back in 01/2023 when admitted for concussion and fall also has lung nodule Underlying CAD, HTN, HLD Previous neurocardiogenic syncope Both vascular and Alzheimer's dementia secondary to TIA OSA supposed to be on CPAP Recent admission 10/24 through 10/28 for aspiration pneumonia-was discharged with oral antibiotics  12/10 readmit with fall out of  wheelchair and hit his head GCS was 8 was responsive to painful stimuli BP 73/40 altered mental status-possibly secondary to pneumonia possibly secondary to volume depletion as well as severe hypernatremia-CT head no acute intracranial abnormality CT chest mild dependent ground glass reticular opacities cervical spine no fracture   Assessment  & Plan :    Concussion and confusion DDx hyponatremia +/- pneumonia CT head imaging findings do not confirm any bleeding-concussion may cause confusion----hypernatremia was probably one of the drivers as well He may have an underlying pneumonia so we will cover with azithromycin  and ceftriaxone  and check an x-ray tomorrow--etiology would be likely aspiration- pathogen panel is negative and his blood cultures preliminarily are negative so I have low suspicion of overt infection at this time  Hypernatremia AKI on admission Aggressively corrected with D5 and now have changed to LR 75 cc/H-has resolved at this time AKI is resolved as well with fluids--- would hold ongoing torsemide  10, lisinopril  20 going forward  sick sinus syndrome A-fib CHADVASC >4 not on  anticoagulation secondary to high risk of bleeding Not on rate controlling agent-monitor only  TIA vascular and Alzheimer's dementia Underlying parkinsonism on Sinemet ?  Parkinson's dementia Aricept  Namenda  Remeron  trazodone all held Holding Geodon  as well- would hold all of these meds Continue Sinemet  1 tab twice daily  Polypharmacy Discontinue Depakote  250 twice daily Lamictal  150 twice daily Remeron  15 trazodone 150 as well as Geodon  40 twice daily  Cecal mass?  Carcinoid  Family previously have elected not to follow-up on these testing   Global--- Long discussion with wife on phone-patient has had dementia for 7 to 10 years usually only recognizes his daughter and his wife and has not really ambulated or done much over the past several years Patient was admitted primarily for IV antibiotics in case this was pneumonia--x-ray to be repeated tomorrow if negative would send without antibiotics if positive would transition to Levaquin and complete a circumscribed course of antibiotics and have hospice discussed do not hospitalize in addition to DNR/DNI-I explained this thoroughly to the wife in terms of this being his end-of-life in his body's way of telling them that it is tired  data Reviewed today:  Sodium 129 potassium 3.7 BUN/creatinine 22/1.0 WBC 5.3 hemoglobin 11.2 platelet 134   DVT prophylaxis: Heparin  subcu  Status is: Observation The patient remains OBS appropriate and will d/c before 2 midnights.    Dispo/Global plan: Back to facility in a.m.   Time 50   Subjective:   Seen in room--no family Quite confused Has C collar on for some  Objective + exam Vitals:   02/11/24 0418 02/11/24 0422 02/11/24 0811 02/11/24 1007  BP: (!) 165/77 (!) 160/97 (!) 183/85 (!) 130/96  Pulse: 65 60 68 77  Resp: 19 19 18 16   Temp: (!) 97.5 F (36.4 C) (!) 97.5 F (36.4 C) 98.2 F (36.8 C)   TempSrc:  Axillary    SpO2: 94% 100% 100% 96%   There were no vitals filed for this  visit.   Examination: Confused mucosa dry      Scheduled Meds:  heparin   5,000 Units Subcutaneous Q8H   Continuous Infusions:  azithromycin  Stopped (02/10/24 1911)   cefTRIAXone  (ROCEPHIN )  IV Stopped (02/10/24 1717)   dextrose  100 mL/hr at 02/11/24 0757     Jai-Gurmukh Jahshua Bonito, MD  [facility]

## 2024-02-11 NOTE — Progress Notes (Signed)
 [name] [employer] liaison note     This patient is a current hospice patient with [facility]. Please see media tab for detailed hospice report.    Liaison will continue to follow for any discharge planning needs and to coordinate continuation of hospice care.    Please don't hesitate to call with any Hospice related questions or concerns.    Thank you for the opportunity to participate in this patient's care.  Greig Basket, BSN, RN [facility] Liaison [phone]

## 2024-02-11 NOTE — Progress Notes (Signed)
 Hypoglycemic Event  CBG: 60  Treatment: D50 25 mL (12.5 gm)  Symptoms: lethargic  Follow-up CBG: Time:1707  CBG Result:101   Possible Reasons for Event: Poor PO intake, IVF with dextrose  elevated blood sugar to 581. Dextrose  stopped, 8 units of insulin given. Pt still with poor PO intake.   Comments/MD notified:will notify Dr. Samtani    [name] [name] A Proctor-Gann

## 2024-02-11 NOTE — Care Management Obs Status (Signed)
 MEDICARE OBSERVATION STATUS NOTIFICATION   Patient Details  Name: [name] [name] MRN: [id number] Date of Birth: 05-28-1945   Medicare Observation Status Notification Given:  Yes  Verbally reviewed observation notice with [name] Alter telephonically at [phone]. Patient does not want a copy      Jessica Seidman 02/11/2024, 11:26 AM

## 2024-02-11 NOTE — Progress Notes (Signed)
 [name] [name] [facility]  [facility] Liaison Note  [name] [facility]  is a current patient with [facility] with a terminal diagnosis of Alzheimer's Disease. Presents from outside facility after a fall where he hit his head and suffered from altered mental status.  GCS of around 8 per EMS. CT head and spine negative for any acute abnormality. Recent admission for sepsis secondary to pneumonia. He was admitted on 12.10.2025 after CT chest showed possible atelectasis versus infiltrates in left lung base. MD covered for pneumonia with ceftriaxone  and azithromycin .  Per Dr. Curlee Fines [name] with [facility] this is a related hospital admission. Patient is a DNR.  Visited patient, no family at the bedside. RN was in the room giving him sips of water  and applesauce. She said that he usually has a bit of a hard time and coughs when swallowing, but he seemed to tolerate it fine. He is alert but confused, doesn't seem to be in any distress. Called patient's wife [name] [name] and his daughter [name] [name] and discussed the patient's many ED visits. They are going to speak with [name] [name] about that as his daughter does not want him to keep going to the ED all of the time.   Patient remains inpatient appropriate for skilled monitoring, assessment and IV antibiotic administration.    V/S: 98.2, 130/96, 77, 16, Sats 96% on room air   I/O: not recorded   Abnormal Labs:  12.10.2025 Na 150 (H) Gluc 122 (H) C02 34 (H) BUN 30 (H) Cr 1.48 (H) Cal 10.5 (H) Alb 3.0(L) Prot 6.4 (L)  Diagnostics:  CT head- negative CT spine - no acute fractures Xray chest - negative CT chest - mild ground glass opacities likely representing a combo of mild edema and atelectasis   IV/PRN medications: Zithromax  50mg  IV q24H Rocephin  2g IV q24 H   Recommendations/Plan per Dr. Colen [name]  progress note dated 12.11.2025:  Assessment  & Plan :      Concussion and confusion DDx  hyponatremia +/- pneumonia CT head imaging findings do not confirm any bleeding-concussion may cause confusion----natremia was probably one of the drivers as well He may have an underlying pneumonia so we will cover with azithromycin  and ceftriaxone  and check an x-ray tomorrow--etiology would be likely aspiration- pathogen panel is negative and his blood cultures preliminarily are negative so I have low suspicion of overt infection at this time   Global--- Long discussion with wife on phone-patient has had dementia for 7 to 10 years usually only recognizes his daughter and his wife and has not really ambulated or done much over the past several years Patient was admitted primarily for IV antibiotics in case this was pneumonia--x-ray to be repeated tomorrow if negative would send without antibiotics if positive would transition to Levaquin and complete a circumscribed course of antibiotics and have hospice discussed do not hospitalize in addition to DNR/DNI-I explained this thoroughly to the wife in terms of this being his end-of-life in his body's way of telling them that it is tired   The patient remains OBS appropriate and will d/c before 2 midnights.   IDT: updated   Goals of care: clear, comfort focused care, DNR Family contact: [name] [name] (spouse) [phone]   Should patient need ambulance transport, please use [facility] as they contract this service for our active hospice patients.   Please call with any hospice questions or concerns.   [name] [name], BSN, RN [facility] Liaison [phone]

## 2024-02-11 NOTE — Plan of Care (Signed)
  Problem: Activity: Goal: Ability to tolerate increased activity will improve Outcome: Not Progressing   Problem: Clinical Measurements: Goal: Ability to maintain a body temperature in the normal range will improve Outcome: Progressing   Problem: Respiratory: Goal: Ability to maintain adequate ventilation will improve Outcome: Progressing Goal: Ability to maintain a clear airway will improve Outcome: Progressing

## 2024-02-12 ENCOUNTER — Inpatient Hospital Stay (HOSPITAL_COMMUNITY)

## 2024-02-12 DIAGNOSIS — R410 Disorientation, unspecified: Principal | ICD-10-CM

## 2024-02-12 LAB — CBC WITH DIFFERENTIAL/PLATELET
Abs Immature Granulocytes: 0.02 K/uL (ref 0.00–0.07)
Basophils Absolute: 0.1 K/uL (ref 0.0–0.1)
Basophils Relative: 1 %
Eosinophils Absolute: 0.3 K/uL (ref 0.0–0.5)
Eosinophils Relative: 3 %
HCT: 35.8 % — ABNORMAL LOW (ref 39.0–52.0)
Hemoglobin: 11.2 g/dL — ABNORMAL LOW (ref 13.0–17.0)
Immature Granulocytes: 0 %
Lymphocytes Relative: 12 %
Lymphs Abs: 1.1 K/uL (ref 0.7–4.0)
MCH: 30.4 pg (ref 26.0–34.0)
MCHC: 31.3 g/dL (ref 30.0–36.0)
MCV: 97.3 fL (ref 80.0–100.0)
Monocytes Absolute: 0.5 K/uL (ref 0.1–1.0)
Monocytes Relative: 6 %
Neutro Abs: 7.3 K/uL (ref 1.7–7.7)
Neutrophils Relative %: 78 %
Platelets: 149 K/uL — ABNORMAL LOW (ref 150–400)
RBC: 3.68 MIL/uL — ABNORMAL LOW (ref 4.22–5.81)
RDW: 13.7 % (ref 11.5–15.5)
WBC: 9.2 K/uL (ref 4.0–10.5)
nRBC: 0 % (ref 0.0–0.2)

## 2024-02-12 LAB — BASIC METABOLIC PANEL WITH GFR
Anion gap: 7 (ref 5–15)
BUN: 22 mg/dL (ref 8–23)
CO2: 31 mmol/L (ref 22–32)
Calcium: 10.1 mg/dL (ref 8.9–10.3)
Chloride: 105 mmol/L (ref 98–111)
Creatinine, Ser: 1.05 mg/dL (ref 0.61–1.24)
GFR, Estimated: >60 mL/min (ref >60)
Glucose, Bld: 108 mg/dL — ABNORMAL HIGH (ref 70–99)
Potassium: 4.7 mmol/L (ref 3.5–5.1)
Sodium: 143 mmol/L (ref 135–145)

## 2024-02-12 LAB — GLUCOSE, CAPILLARY
Glucose-Capillary: 115 mg/dL — ABNORMAL HIGH (ref 70–99)
Glucose-Capillary: 121 mg/dL — ABNORMAL HIGH (ref 70–99)
Glucose-Capillary: 127 mg/dL — ABNORMAL HIGH (ref 70–99)
Glucose-Capillary: 95 mg/dL (ref 70–99)
Glucose-Capillary: 97 mg/dL (ref 70–99)
Glucose-Capillary: 98 mg/dL (ref 70–99)

## 2024-02-12 MED ORDER — ZIPRASIDONE HCL 40 MG PO CAPS: 40.0000 mg | ORAL_CAPSULE | Freq: Two times a day (BID) | ORAL | 0 refills | Status: AC

## 2024-02-12 MED ORDER — TRAZODONE HCL 150 MG PO TABS: 150.0000 mg | ORAL_TABLET | Freq: Every day | ORAL | 0 refills | Status: AC

## 2024-02-12 MED ADMIN — Carbidopa & Levodopa Tab 25-250 MG: 1 | ORAL | NDC 72888010801

## 2024-02-12 NOTE — Progress Notes (Signed)
 We continue waiting for [facility] to transport patient to his facility.

## 2024-02-12 NOTE — TOC Transition Note (Signed)
 Transition of Care [facility]) - Discharge Note   Patient Details  Name: [name] [name] MRN: [id number] Date of Birth: [birth year]  [facility]) CM/SW Contact:  Lendia Dais, LCSWA Phone Number: 02/12/2024, 11:44 AM   Clinical Narrative:  Pt is discharging and returning to [facility]. RN report to [phone]. [facility] called and will arrive in less than an hours.  CSW attempted to call spouse [name] but no answer and no option to leave a VM. CSW spoke to Traci (daughter) and [name] was agreeable.   No further TOC needs.    Final next level of care: Skilled Nursing Facility Barriers to Discharge: Barriers Resolved   Patient Goals and CMS Choice            Discharge Placement              Patient chooses bed at: Other - please specify in the comment section below: [name]) Patient to be transferred to facility by: [facility] Name of family member notified: [name] (daughter) Patient and family notified of of transfer: 02/12/24  Discharge Plan and Services Additional resources added to the After Visit Summary for                                       Social Drivers of Health (SDOH) Interventions SDOH Screenings   Food Insecurity: Patient Unable To Answer (02/12/2024)  Housing: Unknown (02/12/2024)  Transportation Needs: Patient Unable To Answer (02/12/2024)  Utilities: Patient Unable To Answer (02/12/2024)  Social Connections: Unknown (02/12/2024)  Tobacco Use: Medium Risk (02/04/2024)     Readmission Risk Interventions     No data to display

## 2024-02-12 NOTE — Progress Notes (Signed)
 Report given to Sana, RN at [facility] care center.

## 2024-02-12 NOTE — Discharge Summary (Signed)
 Physician Discharge Summary  [name] [name] FMW:[id number] DOB: September 10, 1945 DOA: 02/10/2024  PCP: Clarice Nottingham, MD  Admit date: 02/10/2024 Discharge date: 02/12/2024  Time spent: 20 minutes  Recommendations for Outpatient Follow-up:  Needs consolidation of goals of care DNR/DNI and recommend do not hospitalize going forward either  Discharge Diagnoses:  MAIN problem for hospitalization   Acute metabolic encephalopathy in the setting of adult failure to thrive AKI and severe hypernatremia  Please see below for itemized issues addressed in HOpsital- refer to other progress notes for clarity if needed  Discharge Condition: Guarded DNR  Diet recommendation: Comfort/dysphagia 3  There were no vitals filed for this visit.  History of present illness:  78-year-old male known history atrial fibrillation sick sinus syndrome on pacemaker without anticoagulation Parkinson's disease on Sinemet  followed in [place] with Dr. Lane, located by TIA bipolar Known cecal mass-?  Carcinoid back in 01/2023 when admitted for concussion and fall also has lung nodule Underlying CAD, HTN, HLD Previous neurocardiogenic syncope Both vascular and Alzheimer's dementia secondary to TIA OSA supposed to be on CPAP Recent admission 10/24 through 10/28 for aspiration pneumonia-was discharged with oral antibiotics   12/10 readmit with fall out of  wheelchair and hit his head GCS was 8 was responsive to painful stimuli BP 73/40 altered mental status-possibly secondary to pneumonia possibly secondary to volume depletion as well as severe hypernatremia-CT head no acute intracranial abnormality CT chest mild dependent ground glass reticular opacities cervical spine no fracture    Assessment  & Plan :      Concussion and confusion DDx hyponatremia +/- pneumonia CT head imaging findings do not confirm any bleeding-concussion may cause confusion----hypernatremia was probably one of the drivers as well X-rays were  repeated on day of discharge 12/12 showed no abnormality-he is at high risk for dysphagia however so we completed antibiotics at day of discharge and he will go back to his facility for end-of-life consolidation after discussion as below   Hypernatremia AKI on admission Aggressively corrected with fluids  Patient currently has no AKI and hypernatremia but this will recur   sick sinus syndrome A-fib CHADVASC >4 not on anticoagulation secondary to high risk of bleeding Not on rate controlling agent-monitor only   TIA vascular and Alzheimer's dementia Underlying parkinsonism on Sinemet ?  Parkinson's dementia Aricept  Namenda  Remeron  trazodone all held on admission Has severe behavioral disturbances so continuing Geodon  twice daily 40, for sleep resuming low-dose trazodone Continue Sinemet  1 tab twice daily   Polypharmacy Discontinue Depakote  250 twice daily Lamictal  150 twice daily Remeron  15 These medications probably worsened his underlying mental state would not add back things   Cecal mass?  Carcinoid           Family previously have elected not to follow-up on these testing     Global--- Long discussion with wife on phone-patient has had dementia for 7 to 10 years usually only recognizes his daughter and his wife and has not really ambulated or done much over the past several years Patient was admitted primarily for IV antibiotics in case this was pneumonia--x-ray  was negative on day of discharge He is at very high risk for aspiration and will go home on a dysphagia 3 diet I have asked hospice to follow-up in the outpatient setting and advocate strongly for do not hospitalize given his intense suffering and exposure to healthcare which does not warrant hospitalization as goals of care are clear with family  Discharge Exam: Vitals:   02/12/24 0832 02/12/24  0835  BP: (!) 172/79 (!) 167/92  Pulse:    Resp:    Temp:    SpO2:      Subj on day of d/c   Confused no distress EOMI  NCAT Chest clear  Discharge Instructions   Discharge Instructions     Increase activity slowly   Complete by: As directed       Allergies as of 02/12/2024       Reactions   Latex Other (See Comments)   Unknown         Medication List     STOP taking these medications    atorvastatin  40 MG tablet Commonly known as: LIPITOR   divalproex  250 MG DR tablet Commonly known as: DEPAKOTE    donepezil  10 MG tablet Commonly known as: ARICEPT    folic acid  1 MG tablet Commonly known as: FOLVITE    lamoTRIgine  150 MG tablet Commonly known as: LAMICTAL    lisinopril  10 MG tablet Commonly known as: ZESTRIL    memantine  10 MG tablet Commonly known as: NAMENDA    mirtazapine  15 MG tablet Commonly known as: REMERON    Repatha SureClick 140 MG/ML Soaj Generic drug: Evolocumab   torsemide  10 MG tablet Commonly known as: DEMADEX        TAKE these medications    carbidopa -levodopa  25-250 MG tablet Commonly known as: SINEMET  IR Take 1 tablet by mouth 2 (two) times daily. The timing of this medication is very important.   acetaminophen  325 MG tablet Commonly known as: TYLENOL  Take 650 mg by mouth in the morning and at bedtime.   traZODone 150 MG tablet Commonly known as: DESYREL Take 1 tablet (150 mg total) by mouth at bedtime.   ziprasidone  40 MG capsule Commonly known as: GEODON  Take 1 capsule (40 mg total) by mouth 2 (two) times daily with a meal. What changed: Another medication with the same name was removed. Continue taking this medication, and follow the directions you see here.       Allergies[1]    The results of significant diagnostics from this hospitalization (including imaging, microbiology, ancillary and laboratory) are listed below for reference.    Significant Diagnostic Studies: DG CHEST PORT 1 VIEW Result Date: 02/12/2024 CLINICAL DATA:  Pneumonia EXAM: PORTABLE CHEST 1 VIEW COMPARISON:  Two days ago FINDINGS: Stable cardiomediastinal  silhouette. Stable position of left-sided pacemaker. Both lungs are clear. The visualized skeletal structures are unremarkable. IMPRESSION: No active disease. Electronically Signed   By: Lynwood Landy Raddle M.D.   On: 02/12/2024 08:58   DG Chest Portable 1 View Result Date: 02/10/2024 EXAM: 1 VIEW(S) XRAY OF THE CHEST 02/10/2024 12:01:00 PM COMPARISON: Comparison 6 days ago. CLINICAL HISTORY: ams FINDINGS: LUNGS AND PLEURA: No focal pulmonary opacity. No pleural effusion. No pneumothorax. HEART AND MEDIASTINUM: Left-sided pacemaker is unchanged. Stable cardiomediastinal silhouette. BONES AND SOFT TISSUES: No acute osseous abnormality. IMPRESSION: 1. No acute cardiopulmonary process. Electronically signed by: Lynwood Landy MD 02/10/2024 12:28 PM EST RP Workstation: [id number]   CT Chest Wo Contrast Result Date: 02/10/2024 EXAM: CT CHEST WITHOUT CONTRAST 02/10/2024 11:53:00 AM TECHNIQUE: CT of the chest was performed without the administration of intravenous contrast. Multiplanar reformatted images are provided for review. Automated exposure control, iterative reconstruction, and/or weight based adjustment of the mA/kV was utilized to reduce the radiation dose to as low as reasonably achievable. COMPARISON: CT of the chest dated 12/25/2023. CLINICAL HISTORY: Sepsis workup. Eval for pneumonia. FINDINGS: MEDIASTINUM: Heart: Moderate calcific coronary artery disease. Single lead cardiac pacers present. Pericardium is unremarkable. The  central airways are clear. Moderate calcific atheromatous disease within the thoracic aorta. LYMPH NODES: Bulky calcified right mediastinal lymph node. No hilar or axillary lymphadenopathy. LUNGS AND PLEURA: Mild ground-glass and reticular opacities present dependently within the lower lobes, likely representing a combination of mild edema and atelectasis. Small calcified granuloma present laterally within the right upper lobe. No pleural effusion or pneumothorax. SOFT TISSUES/BONES: Mild  degenerative changes throughout the thoracic spine. No acute abnormality of the bones or soft tissues. UPPER ABDOMEN: Limited images of the upper abdomen demonstrates no acute abnormality. IMPRESSION: 1. Mild dependent lower lobe ground-glass and reticular opacities, favor mild edema and atelectasis; correlate clinically for infection given sepsis evaluation. 2. Small calcified granuloma in the right upper lobe. 3. Bulky calcified right mediastinal lymph node, likely sequela of prior granulomatous disease. Electronically signed by: Evalene Coho MD 02/10/2024 12:27 PM EST RP Workstation: [id number]   CT Cervical Spine Wo Contrast Result Date: 02/10/2024 EXAM: CT CERVICAL SPINE WITHOUT CONTRAST 02/10/2024 11:53:00 AM TECHNIQUE: CT of the cervical spine was performed without the administration of intravenous contrast. Multiplanar reformatted images are provided for review. Automated exposure control, iterative reconstruction, and/or weight based adjustment of the mA/kV was utilized to reduce the radiation dose to as low as reasonably achievable. COMPARISON: CT of the cervical spine dated 02/04/2024. CLINICAL HISTORY: Polytrauma, blunt. FINDINGS: CERVICAL SPINE: BONES AND ALIGNMENT: No acute fracture or traumatic malalignment. There is grade 1 anterolisthesis again demonstrated at C4-C5. DEGENERATIVE CHANGES: There are also anterior bridging osteophytes again seen extending from C2 to T1. There is fusion of the right facet joints at C3-C4 and the left facet joints at C2-C3 and C3-C4. There is moderate-to-severe left neural foraminal stenosis at C4-C5 secondary to uncovertebral joint-type hypertrophy and facet hypertrophy. There is mild-to-moderate central spinal canal stenosis and moderate bilateral neural foraminal stenosis at C5-C6. SOFT TISSUES: No prevertebral soft tissue swelling. There are calcifications within the carotid bulbs bilaterally. IMPRESSION: 1. No acute abnormality of the cervical spine related  to the polytrauma. 2. Grade 1 anterolisthesis at C4-5, anterior bridging osteophytes from C2 to T1, and fusion of the right facet joints at C3-4 and the left facet joints at C2-3 and C3-4. 3. Moderate-to-severe left neural foraminal stenosis at C4-5 secondary to uncovertebral joint-type hypertrophy and facet hypertrophy. 4. Mild-to-moderate central spinal canal stenosis and moderate bilateral neural foraminal stenosis at C5-6. 5. Bilateral carotid bulb calcifications, which may reflect atherosclerosis. Consider correlation with cardiovascular risk factors and, if clinically indicated, carotid ultrasound. Electronically signed by: Evalene Coho MD 02/10/2024 12:04 PM EST RP Workstation: [id number]   CT Head Wo Contrast Result Date: 02/10/2024 EXAM: CT HEAD WITHOUT 02/10/2024 11:53:00 AM TECHNIQUE: CT of the head was performed without the administration of intravenous contrast. Automated exposure control, iterative reconstruction, and/or weight based adjustment of the mA/kV was utilized to reduce the radiation dose to as low as reasonably achievable. COMPARISON: 02/04/2024 CLINICAL HISTORY: Polytrauma, blunt FINDINGS: BRAIN AND VENTRICLES: No acute intracranial hemorrhage. No mass effect or midline shift. No extra-axial fluid collection. Periventricular white matter decreased attenuation consistent with small vessel ischemic changes. Right frontal lobe encephalomalacia consistent with old infarct. Prominent ventricles, sulci and cisterns consistent with age related involutional changes. ORBITS: No acute abnormality. SINUSES AND MASTOIDS: No acute abnormality. SOFT TISSUES AND SKULL: No acute skull fracture. No acute soft tissue abnormality. IMPRESSION: 1. No acute intracranial abnormality. 2. Right frontal lobe encephalomalacia consistent with old infarct. 3. Periventricular white matter decreased attenuation consistent with small vessel ischemic changes. 4. Prominent ventricles,  sulci, and cisterns consistent  with age-related involutional changes. Electronically signed by: Evalene Coho MD 02/10/2024 11:56 AM EST RP Workstation: [id number]   CT Cervical Spine Wo Contrast Result Date: 02/05/2024 EXAM: CT CERVICAL SPINE WITHOUT CONTRAST 02/04/2024 11:12:00 PM TECHNIQUE: CT of the cervical spine was performed without the administration of intravenous contrast. Multiplanar reformatted images are provided for review. Automated exposure control, iterative reconstruction, and/or weight based adjustment of the mA/kV was utilized to reduce the radiation dose to as low as reasonably achievable. COMPARISON: 10 / 23 / 25 CLINICAL HISTORY: fall fall FINDINGS: CERVICAL SPINE: BONES AND ALIGNMENT: No acute fracture or traumatic malalignment. DEGENERATIVE CHANGES: Fusion of anterior osteophytes throughout the cervical spine. Multilevel advanced facet arthropathy with ankylosis of the bilateral C2-C4 facets. Calcification of the posterior longitudinal ligament at C5-C6 causes mild effacement of the ventral thecal sac. No severe spinal canal narrowing. SOFT TISSUES: No prevertebral soft tissue swelling. IMPRESSION: 1. No acute abnormality of the cervical spine . Electronically signed by: Norman Gatlin MD 02/05/2024 12:05 AM EST RP Workstation: [id number]   CT Head Wo Contrast Result Date: 02/04/2024 EXAM: CT HEAD WITHOUT CONTRAST 02/04/2024 11:12:00 PM TECHNIQUE: CT of the head was performed without the administration of intravenous contrast. Automated exposure control, iterative reconstruction, and/or weight based adjustment of the mA/kV was utilized to reduce the radiation dose to as low as reasonably achievable. COMPARISON: Comparison with 12/25/2023. CLINICAL HISTORY: fall FINDINGS: BRAIN AND VENTRICLES: No acute hemorrhage. Chronic right MCA territory infarct. Chronic microvascular ischemia and generalized atrophy. No hydrocephalus. No extra-axial collection. No mass effect or midline shift. ORBITS: No acute abnormality.  SINUSES: No acute abnormality. SOFT TISSUES AND SKULL: No acute soft tissue abnormality. No skull fracture. IMPRESSION: 1. No acute intracranial abnormality related to the fall. Electronically signed by: Norman Gatlin MD 02/04/2024 11:23 PM EST RP Workstation: [id number]   DG Chest Portable 1 View Result Date: 02/04/2024 CLINICAL DATA:  Status post fall. EXAM: PORTABLE CHEST 1 VIEW COMPARISON:  December 25, 2023 FINDINGS: There is stable single lead ventricular pacer positioning. A loop recorder device is noted. The heart size and mediastinal contours are within normal limits. The lungs are hyperinflated. A small ill-defined nipple shadow versus focal scarring and/or atelectasis is seen overlying the left lung base. The visualized skeletal structures are unremarkable. IMPRESSION: Small ill-defined nipple shadow versus focal scarring and/or atelectasis overlying the left lung base. Correlation with follow-up chest plain film with nipple markers is recommended. Electronically Signed   By: Suzen Dials M.D.   On: 02/04/2024 20:43    Microbiology: Recent Results (from the past 240 hours)  Blood culture (routine x 2)     Status: None (Preliminary result)   Collection Time: 02/10/24  1:41 PM   Specimen: BLOOD  Result Value Ref Range Status   Specimen Description BLOOD LEFT ANTECUBITAL  Final   Special Requests   Final    BOTTLES DRAWN AEROBIC ONLY Blood Culture results may not be optimal due to an inadequate volume of blood received in culture bottles   Culture   Final    NO GROWTH 2 DAYS Performed at [facility], [address] [place], [place] [address]    Report Status PENDING  Incomplete  Blood culture (routine x 2)     Status: None (Preliminary result)   Collection Time: 02/10/24  4:36 PM   Specimen: BLOOD  Result Value Ref Range Status   Specimen Description BLOOD LEFT ANTECUBITAL  Final   Special Requests   Final  BOTTLES DRAWN AEROBIC ONLY Blood Culture results may not be  optimal due to an inadequate volume of blood received in culture bottles   Culture   Final    NO GROWTH 2 DAYS Performed at [facility], [address], [place], [place] [address]    Report Status PENDING  Incomplete  Respiratory (~20 pathogens) panel by PCR     Status: None   Collection Time: 02/10/24  4:52 PM   Specimen: Nasopharyngeal Swab; Respiratory  Result Value Ref Range Status   Adenovirus NOT DETECTED NOT DETECTED Final   Coronavirus 229E NOT DETECTED NOT DETECTED Final    Comment: (NOTE) The Coronavirus on the Respiratory Panel, DOES NOT test for the novel  Coronavirus (2019 nCoV)    Coronavirus HKU1 NOT DETECTED NOT DETECTED Final   Coronavirus NL63 NOT DETECTED NOT DETECTED Final   Coronavirus OC43 NOT DETECTED NOT DETECTED Final   Metapneumovirus NOT DETECTED NOT DETECTED Final   Rhinovirus / Enterovirus NOT DETECTED NOT DETECTED Final   Influenza A NOT DETECTED NOT DETECTED Final   Influenza B NOT DETECTED NOT DETECTED Final   Parainfluenza Virus 1 NOT DETECTED NOT DETECTED Final   Parainfluenza Virus 2 NOT DETECTED NOT DETECTED Final   Parainfluenza Virus 3 NOT DETECTED NOT DETECTED Final   Parainfluenza Virus 4 NOT DETECTED NOT DETECTED Final   Respiratory Syncytial Virus NOT DETECTED NOT DETECTED Final   Bordetella pertussis NOT DETECTED NOT DETECTED Final   Bordetella Parapertussis NOT DETECTED NOT DETECTED Final   Chlamydophila pneumoniae NOT DETECTED NOT DETECTED Final   Mycoplasma pneumoniae NOT DETECTED NOT DETECTED Final    Comment: Performed at [facility], [address] [place] [address], [place], [place] [address]     Labs: Basic Metabolic Panel: Recent Labs  Lab 02/10/24 1225 02/10/24 1242 02/11/24 1233 02/12/24 0513  NA 150* 149* 129* 143  K 4.3 4.5 3.7 4.7  CL 109 110 94* 105  CO2 34*  --  27 31  GLUCOSE 122* 110* 581* 108*  BUN 30* 40* 22 22  CREATININE 1.48* 1.40* 1.06 1.05  CALCIUM  10.5*  --  9.2 10.1   Liver Function Tests: Recent  Labs  Lab 02/10/24 1225 02/11/24 1233  AST 20 21  ALT <5 9  ALKPHOS 102 86  BILITOT 0.6 0.4  PROT 6.4* 6.0*  ALBUMIN 3.0* 2.7*   No results for input(s): LIPASE, AMYLASE in the last 168 hours. No results for input(s): AMMONIA in the last 168 hours. CBC: Recent Labs  Lab 02/10/24 1136 02/10/24 1242 02/11/24 1233 02/12/24 0513  WBC 6.9  --  5.3 9.2  NEUTROABS 5.2  --  3.7 7.3  HGB 13.0 12.9* 11.2* 11.2*  HCT 42.9 38.0* 35.7* 35.8*  MCV 100.0  --  97.8 97.3  PLT 172  --  134* 149*   Cardiac Enzymes: Recent Labs  Lab 02/10/24 1636  CKTOTAL 321   BNP: BNP (last 3 results) No results for input(s): BNP in the last 8760 hours.  ProBNP (last 3 results) No results for input(s): PROBNP in the last 8760 hours.  CBG: Recent Labs  Lab 02/12/24 0007 02/12/24 0206 02/12/24 0423 02/12/24 0641 02/12/24 0748  GLUCAP 95 98 97 115* 121*    Signed:  Colen Grimes MD   [facility] 02/12/2024, 10:53 AM      [1]  Allergies Allergen Reactions   Latex Other (See Comments)    Unknown

## 2024-02-12 NOTE — Plan of Care (Signed)
°  Problem: Clinical Measurements: Goal: Ability to maintain a body temperature in the normal range will improve Outcome: Progressing   Problem: Clinical Measurements: Goal: Will remain free from infection Outcome: Progressing   Problem: Coping: Goal: Level of anxiety will decrease Outcome: Progressing

## 2024-02-12 NOTE — TOC Progression Note (Deleted)
 Transition of Care [facility]) - Progression Note    Patient Details  Name: [name] [name] MRN: [id number] Date of Birth: 07-Jul-1945  [facility] CM/SW Contact  Lendia Dais, [place] Phone Number: 02/12/2024, 11:44 AM  Clinical Narrative:         Barriers to Discharge: Barriers Resolved               Expected Discharge Plan and Services         Expected Discharge Date: 02/12/24                                     Social Drivers of Health (SDOH) Interventions SDOH Screenings   Food Insecurity: Patient Unable To Answer (02/12/2024)  Housing: Unknown (02/12/2024)  Transportation Needs: Patient Unable To Answer (02/12/2024)  Utilities: Patient Unable To Answer (02/12/2024)  Social Connections: Unknown (02/12/2024)  Tobacco Use: Medium Risk (02/04/2024)    Readmission Risk Interventions     No data to display

## 2024-02-15 LAB — CULTURE, BLOOD (ROUTINE X 2)
Culture: NO GROWTH
Culture: NO GROWTH

## 2024-02-29 ENCOUNTER — Ambulatory Visit: Payer: Medicare PPO

## 2024-04-04 ENCOUNTER — Ambulatory Visit: Payer: Medicare PPO

## 2024-05-09 ENCOUNTER — Ambulatory Visit: Payer: Medicare PPO

## 2024-06-13 ENCOUNTER — Ambulatory Visit: Payer: Medicare PPO

## 2024-07-18 ENCOUNTER — Ambulatory Visit: Payer: Medicare PPO

## 2024-08-22 ENCOUNTER — Ambulatory Visit: Payer: Medicare PPO

## 2024-09-26 ENCOUNTER — Ambulatory Visit: Payer: Medicare PPO

## 2024-10-31 ENCOUNTER — Ambulatory Visit: Payer: Medicare PPO
